# Patient Record
Sex: Female | Born: 1935
Health system: Southern US, Community
[De-identification: ages and names within clinical notes are randomized; demographics above are authoritative.]

## PROBLEM LIST (undated history)

## (undated) DIAGNOSIS — J40 Bronchitis, not specified as acute or chronic: Secondary | ICD-10-CM

## (undated) DIAGNOSIS — E119 Type 2 diabetes mellitus without complications: Secondary | ICD-10-CM

## (undated) DIAGNOSIS — J449 Chronic obstructive pulmonary disease, unspecified: Secondary | ICD-10-CM

## (undated) DIAGNOSIS — C801 Malignant (primary) neoplasm, unspecified: Secondary | ICD-10-CM

## (undated) DIAGNOSIS — J189 Pneumonia, unspecified organism: Secondary | ICD-10-CM

## (undated) DIAGNOSIS — I10 Essential (primary) hypertension: Secondary | ICD-10-CM

## (undated) DIAGNOSIS — I4891 Unspecified atrial fibrillation: Secondary | ICD-10-CM

## (undated) DIAGNOSIS — K859 Acute pancreatitis without necrosis or infection, unspecified: Secondary | ICD-10-CM

## (undated) DIAGNOSIS — E785 Hyperlipidemia, unspecified: Secondary | ICD-10-CM

## (undated) DIAGNOSIS — I639 Cerebral infarction, unspecified: Secondary | ICD-10-CM

## (undated) DIAGNOSIS — E538 Deficiency of other specified B group vitamins: Secondary | ICD-10-CM

## (undated) HISTORY — DX: Hyperlipidemia, unspecified: E78.5

## (undated) HISTORY — DX: Bronchitis, not specified as acute or chronic: J40

## (undated) HISTORY — DX: Unspecified atrial fibrillation: I48.91

## (undated) HISTORY — DX: Malignant (primary) neoplasm, unspecified: C80.1

## (undated) HISTORY — DX: Essential (primary) hypertension: I10

## (undated) HISTORY — DX: Cerebral infarction, unspecified: I63.9

## (undated) HISTORY — DX: Chronic obstructive pulmonary disease, unspecified: J44.9

## (undated) HISTORY — DX: Deficiency of other specified B group vitamins: E53.8

## (undated) HISTORY — PX: COLONOSCOPY: SHX174

## (undated) HISTORY — DX: Type 2 diabetes mellitus without complications: E11.9

## (undated) HISTORY — DX: Pneumonia, unspecified organism: J18.9

---

## 1973-11-15 HISTORY — PX: ABDOMINAL HYSTERECTOMY: SHX81

## 1987-11-16 HISTORY — PX: BLADDER SUSPENSION: SHX72

## 2000-05-20 ENCOUNTER — Encounter: Admission: RE | Admit: 2000-05-20 | Discharge: 2000-05-20 | Payer: Self-pay | Admitting: Internal Medicine

## 2000-05-20 ENCOUNTER — Encounter: Payer: Self-pay | Admitting: Internal Medicine

## 2001-03-16 ENCOUNTER — Encounter: Admission: RE | Admit: 2001-03-16 | Discharge: 2001-03-16 | Payer: Self-pay | Admitting: Internal Medicine

## 2001-03-16 ENCOUNTER — Encounter: Payer: Self-pay | Admitting: Internal Medicine

## 2001-05-29 ENCOUNTER — Encounter: Admission: RE | Admit: 2001-05-29 | Discharge: 2001-05-29 | Payer: Self-pay | Admitting: Internal Medicine

## 2001-05-29 ENCOUNTER — Encounter: Payer: Self-pay | Admitting: Internal Medicine

## 2002-05-09 ENCOUNTER — Encounter: Admission: RE | Admit: 2002-05-09 | Discharge: 2002-05-09 | Payer: Self-pay | Admitting: Unknown Physician Specialty

## 2002-05-09 ENCOUNTER — Encounter: Payer: Self-pay | Admitting: Unknown Physician Specialty

## 2004-09-03 ENCOUNTER — Ambulatory Visit: Payer: Self-pay | Admitting: Pain Medicine

## 2004-09-09 ENCOUNTER — Ambulatory Visit: Payer: Self-pay | Admitting: Pain Medicine

## 2004-11-26 ENCOUNTER — Ambulatory Visit: Payer: Self-pay | Admitting: Pain Medicine

## 2005-01-11 ENCOUNTER — Ambulatory Visit: Payer: Self-pay | Admitting: Pain Medicine

## 2005-01-21 ENCOUNTER — Emergency Department: Payer: Self-pay | Admitting: Emergency Medicine

## 2005-02-05 ENCOUNTER — Ambulatory Visit: Payer: Self-pay | Admitting: Family Medicine

## 2005-04-08 ENCOUNTER — Ambulatory Visit: Payer: Self-pay

## 2005-05-15 ENCOUNTER — Inpatient Hospital Stay: Payer: Self-pay

## 2005-08-27 ENCOUNTER — Ambulatory Visit: Payer: Self-pay | Admitting: Unknown Physician Specialty

## 2007-02-09 ENCOUNTER — Ambulatory Visit: Payer: Self-pay | Admitting: Family Medicine

## 2009-03-06 ENCOUNTER — Ambulatory Visit: Payer: Self-pay | Admitting: Family Medicine

## 2009-04-10 DIAGNOSIS — E7800 Pure hypercholesterolemia, unspecified: Secondary | ICD-10-CM

## 2009-04-10 DIAGNOSIS — I10 Essential (primary) hypertension: Secondary | ICD-10-CM | POA: Insufficient documentation

## 2009-04-10 DIAGNOSIS — K5732 Diverticulitis of large intestine without perforation or abscess without bleeding: Secondary | ICD-10-CM | POA: Insufficient documentation

## 2009-06-18 DIAGNOSIS — N3281 Overactive bladder: Secondary | ICD-10-CM

## 2009-06-19 ENCOUNTER — Ambulatory Visit: Payer: Self-pay | Admitting: Family Medicine

## 2009-06-26 ENCOUNTER — Ambulatory Visit: Payer: Self-pay | Admitting: Family Medicine

## 2009-07-29 ENCOUNTER — Ambulatory Visit: Payer: Self-pay | Admitting: Family Medicine

## 2009-08-31 DIAGNOSIS — N318 Other neuromuscular dysfunction of bladder: Secondary | ICD-10-CM

## 2010-02-04 ENCOUNTER — Ambulatory Visit: Payer: Self-pay | Admitting: Family Medicine

## 2010-02-04 DIAGNOSIS — M19049 Primary osteoarthritis, unspecified hand: Secondary | ICD-10-CM

## 2010-05-24 ENCOUNTER — Emergency Department: Payer: Self-pay | Admitting: Emergency Medicine

## 2011-03-01 ENCOUNTER — Ambulatory Visit: Payer: Self-pay | Admitting: Family Medicine

## 2011-03-22 ENCOUNTER — Ambulatory Visit: Payer: Self-pay

## 2011-12-17 DIAGNOSIS — I82409 Acute embolism and thrombosis of unspecified deep veins of unspecified lower extremity: Secondary | ICD-10-CM | POA: Diagnosis not present

## 2011-12-17 DIAGNOSIS — Z7901 Long term (current) use of anticoagulants: Secondary | ICD-10-CM | POA: Diagnosis not present

## 2011-12-17 DIAGNOSIS — E559 Vitamin D deficiency, unspecified: Secondary | ICD-10-CM | POA: Diagnosis not present

## 2011-12-17 DIAGNOSIS — Z86718 Personal history of other venous thrombosis and embolism: Secondary | ICD-10-CM | POA: Diagnosis not present

## 2012-01-05 DIAGNOSIS — Z86718 Personal history of other venous thrombosis and embolism: Secondary | ICD-10-CM | POA: Diagnosis not present

## 2012-01-05 DIAGNOSIS — Z7901 Long term (current) use of anticoagulants: Secondary | ICD-10-CM | POA: Diagnosis not present

## 2012-01-05 DIAGNOSIS — I82409 Acute embolism and thrombosis of unspecified deep veins of unspecified lower extremity: Secondary | ICD-10-CM | POA: Diagnosis not present

## 2012-01-05 DIAGNOSIS — E559 Vitamin D deficiency, unspecified: Secondary | ICD-10-CM | POA: Diagnosis not present

## 2012-01-18 DIAGNOSIS — G5600 Carpal tunnel syndrome, unspecified upper limb: Secondary | ICD-10-CM | POA: Diagnosis not present

## 2012-01-18 DIAGNOSIS — E1142 Type 2 diabetes mellitus with diabetic polyneuropathy: Secondary | ICD-10-CM | POA: Diagnosis not present

## 2012-01-18 DIAGNOSIS — G252 Other specified forms of tremor: Secondary | ICD-10-CM | POA: Diagnosis not present

## 2012-01-18 DIAGNOSIS — G250 Essential tremor: Secondary | ICD-10-CM | POA: Diagnosis not present

## 2012-02-02 DIAGNOSIS — E559 Vitamin D deficiency, unspecified: Secondary | ICD-10-CM | POA: Diagnosis not present

## 2012-02-02 DIAGNOSIS — Z86718 Personal history of other venous thrombosis and embolism: Secondary | ICD-10-CM | POA: Diagnosis not present

## 2012-02-02 DIAGNOSIS — Z7901 Long term (current) use of anticoagulants: Secondary | ICD-10-CM | POA: Diagnosis not present

## 2012-02-02 DIAGNOSIS — I82409 Acute embolism and thrombosis of unspecified deep veins of unspecified lower extremity: Secondary | ICD-10-CM | POA: Diagnosis not present

## 2012-03-01 DIAGNOSIS — E559 Vitamin D deficiency, unspecified: Secondary | ICD-10-CM | POA: Diagnosis not present

## 2012-03-01 DIAGNOSIS — Z7901 Long term (current) use of anticoagulants: Secondary | ICD-10-CM | POA: Diagnosis not present

## 2012-03-01 DIAGNOSIS — Z86718 Personal history of other venous thrombosis and embolism: Secondary | ICD-10-CM | POA: Diagnosis not present

## 2012-03-01 DIAGNOSIS — I82409 Acute embolism and thrombosis of unspecified deep veins of unspecified lower extremity: Secondary | ICD-10-CM | POA: Diagnosis not present

## 2012-03-24 DIAGNOSIS — Z7901 Long term (current) use of anticoagulants: Secondary | ICD-10-CM | POA: Diagnosis not present

## 2012-03-24 DIAGNOSIS — Z86718 Personal history of other venous thrombosis and embolism: Secondary | ICD-10-CM | POA: Diagnosis not present

## 2012-03-24 DIAGNOSIS — E119 Type 2 diabetes mellitus without complications: Secondary | ICD-10-CM | POA: Diagnosis not present

## 2012-03-24 DIAGNOSIS — E559 Vitamin D deficiency, unspecified: Secondary | ICD-10-CM | POA: Diagnosis not present

## 2012-03-24 DIAGNOSIS — I10 Essential (primary) hypertension: Secondary | ICD-10-CM | POA: Diagnosis not present

## 2012-04-26 DIAGNOSIS — M79609 Pain in unspecified limb: Secondary | ICD-10-CM | POA: Diagnosis not present

## 2012-04-26 DIAGNOSIS — R252 Cramp and spasm: Secondary | ICD-10-CM | POA: Diagnosis not present

## 2012-05-09 DIAGNOSIS — E559 Vitamin D deficiency, unspecified: Secondary | ICD-10-CM | POA: Diagnosis not present

## 2012-05-09 DIAGNOSIS — Z7901 Long term (current) use of anticoagulants: Secondary | ICD-10-CM | POA: Diagnosis not present

## 2012-05-09 DIAGNOSIS — I10 Essential (primary) hypertension: Secondary | ICD-10-CM | POA: Diagnosis not present

## 2012-05-09 DIAGNOSIS — E538 Deficiency of other specified B group vitamins: Secondary | ICD-10-CM | POA: Diagnosis not present

## 2012-05-09 DIAGNOSIS — R252 Cramp and spasm: Secondary | ICD-10-CM | POA: Diagnosis not present

## 2012-06-22 DIAGNOSIS — E119 Type 2 diabetes mellitus without complications: Secondary | ICD-10-CM | POA: Diagnosis not present

## 2012-06-22 DIAGNOSIS — Z7901 Long term (current) use of anticoagulants: Secondary | ICD-10-CM | POA: Diagnosis not present

## 2012-06-22 DIAGNOSIS — I10 Essential (primary) hypertension: Secondary | ICD-10-CM | POA: Diagnosis not present

## 2012-07-25 DIAGNOSIS — E119 Type 2 diabetes mellitus without complications: Secondary | ICD-10-CM | POA: Diagnosis not present

## 2012-07-25 DIAGNOSIS — I10 Essential (primary) hypertension: Secondary | ICD-10-CM | POA: Diagnosis not present

## 2012-07-25 DIAGNOSIS — Z7901 Long term (current) use of anticoagulants: Secondary | ICD-10-CM | POA: Diagnosis not present

## 2012-08-04 DIAGNOSIS — Z23 Encounter for immunization: Secondary | ICD-10-CM | POA: Diagnosis not present

## 2012-08-22 DIAGNOSIS — Z86718 Personal history of other venous thrombosis and embolism: Secondary | ICD-10-CM | POA: Diagnosis not present

## 2012-08-22 DIAGNOSIS — Z7901 Long term (current) use of anticoagulants: Secondary | ICD-10-CM | POA: Diagnosis not present

## 2012-08-22 DIAGNOSIS — I10 Essential (primary) hypertension: Secondary | ICD-10-CM | POA: Diagnosis not present

## 2012-09-19 DIAGNOSIS — I10 Essential (primary) hypertension: Secondary | ICD-10-CM | POA: Diagnosis not present

## 2012-09-19 DIAGNOSIS — Z7901 Long term (current) use of anticoagulants: Secondary | ICD-10-CM | POA: Diagnosis not present

## 2012-09-19 DIAGNOSIS — I82409 Acute embolism and thrombosis of unspecified deep veins of unspecified lower extremity: Secondary | ICD-10-CM | POA: Diagnosis not present

## 2012-09-26 DIAGNOSIS — G5600 Carpal tunnel syndrome, unspecified upper limb: Secondary | ICD-10-CM | POA: Diagnosis not present

## 2012-09-26 DIAGNOSIS — M79609 Pain in unspecified limb: Secondary | ICD-10-CM | POA: Diagnosis not present

## 2012-09-26 DIAGNOSIS — E1142 Type 2 diabetes mellitus with diabetic polyneuropathy: Secondary | ICD-10-CM | POA: Diagnosis not present

## 2012-10-24 DIAGNOSIS — I82409 Acute embolism and thrombosis of unspecified deep veins of unspecified lower extremity: Secondary | ICD-10-CM | POA: Diagnosis not present

## 2012-11-23 DIAGNOSIS — I82409 Acute embolism and thrombosis of unspecified deep veins of unspecified lower extremity: Secondary | ICD-10-CM | POA: Diagnosis not present

## 2012-11-28 DIAGNOSIS — Z7901 Long term (current) use of anticoagulants: Secondary | ICD-10-CM | POA: Diagnosis not present

## 2012-11-28 DIAGNOSIS — Z86718 Personal history of other venous thrombosis and embolism: Secondary | ICD-10-CM | POA: Diagnosis not present

## 2012-11-28 DIAGNOSIS — E119 Type 2 diabetes mellitus without complications: Secondary | ICD-10-CM | POA: Diagnosis not present

## 2012-11-28 DIAGNOSIS — R609 Edema, unspecified: Secondary | ICD-10-CM | POA: Diagnosis not present

## 2012-12-22 DIAGNOSIS — I82409 Acute embolism and thrombosis of unspecified deep veins of unspecified lower extremity: Secondary | ICD-10-CM | POA: Diagnosis not present

## 2013-01-20 DIAGNOSIS — I82409 Acute embolism and thrombosis of unspecified deep veins of unspecified lower extremity: Secondary | ICD-10-CM | POA: Diagnosis not present

## 2013-01-26 DIAGNOSIS — M6580 Other synovitis and tenosynovitis, unspecified site: Secondary | ICD-10-CM | POA: Diagnosis not present

## 2013-02-16 DIAGNOSIS — I82409 Acute embolism and thrombosis of unspecified deep veins of unspecified lower extremity: Secondary | ICD-10-CM | POA: Diagnosis not present

## 2013-02-27 DIAGNOSIS — E119 Type 2 diabetes mellitus without complications: Secondary | ICD-10-CM | POA: Diagnosis not present

## 2013-02-27 DIAGNOSIS — E559 Vitamin D deficiency, unspecified: Secondary | ICD-10-CM | POA: Diagnosis not present

## 2013-02-27 DIAGNOSIS — I10 Essential (primary) hypertension: Secondary | ICD-10-CM | POA: Diagnosis not present

## 2013-02-27 DIAGNOSIS — Z7901 Long term (current) use of anticoagulants: Secondary | ICD-10-CM | POA: Diagnosis not present

## 2013-03-16 DIAGNOSIS — I82409 Acute embolism and thrombosis of unspecified deep veins of unspecified lower extremity: Secondary | ICD-10-CM | POA: Diagnosis not present

## 2013-04-14 DIAGNOSIS — I82409 Acute embolism and thrombosis of unspecified deep veins of unspecified lower extremity: Secondary | ICD-10-CM | POA: Diagnosis not present

## 2013-05-12 DIAGNOSIS — I82409 Acute embolism and thrombosis of unspecified deep veins of unspecified lower extremity: Secondary | ICD-10-CM | POA: Diagnosis not present

## 2013-05-29 DIAGNOSIS — E119 Type 2 diabetes mellitus without complications: Secondary | ICD-10-CM | POA: Diagnosis not present

## 2013-06-08 DIAGNOSIS — I82409 Acute embolism and thrombosis of unspecified deep veins of unspecified lower extremity: Secondary | ICD-10-CM | POA: Diagnosis not present

## 2013-07-06 DIAGNOSIS — I82409 Acute embolism and thrombosis of unspecified deep veins of unspecified lower extremity: Secondary | ICD-10-CM | POA: Diagnosis not present

## 2013-07-10 DIAGNOSIS — I10 Essential (primary) hypertension: Secondary | ICD-10-CM | POA: Diagnosis not present

## 2013-07-10 DIAGNOSIS — Z7901 Long term (current) use of anticoagulants: Secondary | ICD-10-CM | POA: Diagnosis not present

## 2013-07-10 DIAGNOSIS — Z23 Encounter for immunization: Secondary | ICD-10-CM | POA: Diagnosis not present

## 2013-07-10 DIAGNOSIS — I82409 Acute embolism and thrombosis of unspecified deep veins of unspecified lower extremity: Secondary | ICD-10-CM | POA: Diagnosis not present

## 2013-07-10 DIAGNOSIS — E1149 Type 2 diabetes mellitus with other diabetic neurological complication: Secondary | ICD-10-CM | POA: Diagnosis not present

## 2013-08-10 DIAGNOSIS — I82409 Acute embolism and thrombosis of unspecified deep veins of unspecified lower extremity: Secondary | ICD-10-CM | POA: Diagnosis not present

## 2013-09-14 DIAGNOSIS — I82409 Acute embolism and thrombosis of unspecified deep veins of unspecified lower extremity: Secondary | ICD-10-CM | POA: Diagnosis not present

## 2013-10-02 DIAGNOSIS — J019 Acute sinusitis, unspecified: Secondary | ICD-10-CM | POA: Diagnosis not present

## 2013-10-02 DIAGNOSIS — Z1212 Encounter for screening for malignant neoplasm of rectum: Secondary | ICD-10-CM | POA: Diagnosis not present

## 2013-10-02 DIAGNOSIS — Z7901 Long term (current) use of anticoagulants: Secondary | ICD-10-CM | POA: Diagnosis not present

## 2013-10-02 DIAGNOSIS — I10 Essential (primary) hypertension: Secondary | ICD-10-CM | POA: Diagnosis not present

## 2013-10-02 DIAGNOSIS — R198 Other specified symptoms and signs involving the digestive system and abdomen: Secondary | ICD-10-CM | POA: Diagnosis not present

## 2013-10-02 DIAGNOSIS — R109 Unspecified abdominal pain: Secondary | ICD-10-CM | POA: Diagnosis not present

## 2013-10-02 DIAGNOSIS — E119 Type 2 diabetes mellitus without complications: Secondary | ICD-10-CM | POA: Diagnosis not present

## 2013-11-22 DIAGNOSIS — I82409 Acute embolism and thrombosis of unspecified deep veins of unspecified lower extremity: Secondary | ICD-10-CM | POA: Diagnosis not present

## 2013-12-03 DIAGNOSIS — I10 Essential (primary) hypertension: Secondary | ICD-10-CM | POA: Diagnosis not present

## 2013-12-03 DIAGNOSIS — J00 Acute nasopharyngitis [common cold]: Secondary | ICD-10-CM | POA: Diagnosis not present

## 2013-12-03 DIAGNOSIS — R05 Cough: Secondary | ICD-10-CM | POA: Diagnosis not present

## 2013-12-03 DIAGNOSIS — R059 Cough, unspecified: Secondary | ICD-10-CM | POA: Diagnosis not present

## 2013-12-03 DIAGNOSIS — E119 Type 2 diabetes mellitus without complications: Secondary | ICD-10-CM | POA: Diagnosis not present

## 2013-12-05 DIAGNOSIS — R918 Other nonspecific abnormal finding of lung field: Secondary | ICD-10-CM | POA: Diagnosis not present

## 2013-12-05 DIAGNOSIS — J189 Pneumonia, unspecified organism: Secondary | ICD-10-CM | POA: Diagnosis not present

## 2013-12-12 ENCOUNTER — Emergency Department: Payer: Self-pay | Admitting: Emergency Medicine

## 2013-12-12 DIAGNOSIS — Z9079 Acquired absence of other genital organ(s): Secondary | ICD-10-CM | POA: Diagnosis not present

## 2013-12-12 DIAGNOSIS — J984 Other disorders of lung: Secondary | ICD-10-CM | POA: Diagnosis not present

## 2013-12-12 DIAGNOSIS — R112 Nausea with vomiting, unspecified: Secondary | ICD-10-CM | POA: Diagnosis not present

## 2013-12-12 DIAGNOSIS — I10 Essential (primary) hypertension: Secondary | ICD-10-CM | POA: Diagnosis not present

## 2013-12-12 DIAGNOSIS — E119 Type 2 diabetes mellitus without complications: Secondary | ICD-10-CM | POA: Diagnosis not present

## 2013-12-12 DIAGNOSIS — Z86718 Personal history of other venous thrombosis and embolism: Secondary | ICD-10-CM | POA: Diagnosis not present

## 2013-12-12 DIAGNOSIS — Z7901 Long term (current) use of anticoagulants: Secondary | ICD-10-CM | POA: Diagnosis not present

## 2013-12-12 DIAGNOSIS — R109 Unspecified abdominal pain: Secondary | ICD-10-CM | POA: Diagnosis not present

## 2013-12-12 DIAGNOSIS — K5900 Constipation, unspecified: Secondary | ICD-10-CM | POA: Diagnosis not present

## 2013-12-12 LAB — URINALYSIS, COMPLETE
BILIRUBIN, UR: NEGATIVE
BLOOD: NEGATIVE
Bacteria: NONE SEEN
Glucose,UR: NEGATIVE mg/dL (ref 0–75)
KETONE: NEGATIVE
Leukocyte Esterase: NEGATIVE
Nitrite: NEGATIVE
PH: 5 (ref 4.5–8.0)
PROTEIN: NEGATIVE
SPECIFIC GRAVITY: 1.012 (ref 1.003–1.030)

## 2013-12-12 LAB — COMPREHENSIVE METABOLIC PANEL
Albumin: 3.3 g/dL — ABNORMAL LOW (ref 3.4–5.0)
Alkaline Phosphatase: 71 U/L
Anion Gap: 7 (ref 7–16)
BUN: 33 mg/dL — ABNORMAL HIGH (ref 7–18)
Bilirubin,Total: 0.4 mg/dL (ref 0.2–1.0)
Calcium, Total: 9.4 mg/dL (ref 8.5–10.1)
Chloride: 99 mmol/L (ref 98–107)
Co2: 24 mmol/L (ref 21–32)
Creatinine: 1.68 mg/dL — ABNORMAL HIGH (ref 0.60–1.30)
EGFR (African American): 32 — ABNORMAL LOW
EGFR (Non-African Amer.): 27 — ABNORMAL LOW
Glucose: 155 mg/dL — ABNORMAL HIGH (ref 65–99)
Osmolality: 271 (ref 275–301)
Potassium: 4.5 mmol/L (ref 3.5–5.1)
SGOT(AST): 23 U/L (ref 15–37)
SGPT (ALT): 22 U/L (ref 12–78)
Sodium: 130 mmol/L — ABNORMAL LOW (ref 136–145)
Total Protein: 7.6 g/dL (ref 6.4–8.2)

## 2013-12-12 LAB — CBC
HCT: 36.8 % (ref 35.0–47.0)
HGB: 12.4 g/dL (ref 12.0–16.0)
MCH: 28.5 pg (ref 26.0–34.0)
MCHC: 33.7 g/dL (ref 32.0–36.0)
MCV: 85 fL (ref 80–100)
Platelet: 416 10*3/uL (ref 150–440)
RBC: 4.35 10*6/uL (ref 3.80–5.20)
RDW: 15.1 % — ABNORMAL HIGH (ref 11.5–14.5)
WBC: 11.1 10*3/uL — ABNORMAL HIGH (ref 3.6–11.0)

## 2013-12-12 LAB — PROTIME-INR
INR: 2.6
Prothrombin Time: 26.9 secs — ABNORMAL HIGH (ref 11.5–14.7)

## 2013-12-12 LAB — LIPASE, BLOOD: Lipase: 145 U/L (ref 73–393)

## 2013-12-12 LAB — TROPONIN I: Troponin-I: <0.02 ng/mL

## 2013-12-24 DIAGNOSIS — I82409 Acute embolism and thrombosis of unspecified deep veins of unspecified lower extremity: Secondary | ICD-10-CM | POA: Diagnosis not present

## 2014-01-15 DIAGNOSIS — I10 Essential (primary) hypertension: Secondary | ICD-10-CM | POA: Diagnosis not present

## 2014-01-15 DIAGNOSIS — G459 Transient cerebral ischemic attack, unspecified: Secondary | ICD-10-CM | POA: Diagnosis not present

## 2014-01-15 DIAGNOSIS — E119 Type 2 diabetes mellitus without complications: Secondary | ICD-10-CM | POA: Diagnosis not present

## 2014-01-15 DIAGNOSIS — Z1212 Encounter for screening for malignant neoplasm of rectum: Secondary | ICD-10-CM | POA: Diagnosis not present

## 2014-01-15 DIAGNOSIS — Z86718 Personal history of other venous thrombosis and embolism: Secondary | ICD-10-CM | POA: Diagnosis not present

## 2014-01-21 DIAGNOSIS — I82409 Acute embolism and thrombosis of unspecified deep veins of unspecified lower extremity: Secondary | ICD-10-CM | POA: Diagnosis not present

## 2014-01-22 ENCOUNTER — Ambulatory Visit: Payer: Self-pay | Admitting: Family Medicine

## 2014-01-22 DIAGNOSIS — I658 Occlusion and stenosis of other precerebral arteries: Secondary | ICD-10-CM | POA: Diagnosis not present

## 2014-01-22 DIAGNOSIS — I6529 Occlusion and stenosis of unspecified carotid artery: Secondary | ICD-10-CM | POA: Diagnosis not present

## 2014-02-18 DIAGNOSIS — I82409 Acute embolism and thrombosis of unspecified deep veins of unspecified lower extremity: Secondary | ICD-10-CM | POA: Diagnosis not present

## 2014-03-04 ENCOUNTER — Inpatient Hospital Stay: Payer: Self-pay | Admitting: Family Medicine

## 2014-03-04 DIAGNOSIS — I10 Essential (primary) hypertension: Secondary | ICD-10-CM | POA: Diagnosis not present

## 2014-03-04 DIAGNOSIS — E871 Hypo-osmolality and hyponatremia: Secondary | ICD-10-CM | POA: Diagnosis not present

## 2014-03-04 DIAGNOSIS — R5381 Other malaise: Secondary | ICD-10-CM | POA: Diagnosis present

## 2014-03-04 DIAGNOSIS — I809 Phlebitis and thrombophlebitis of unspecified site: Secondary | ICD-10-CM | POA: Diagnosis not present

## 2014-03-04 DIAGNOSIS — N10 Acute tubulo-interstitial nephritis: Secondary | ICD-10-CM | POA: Diagnosis not present

## 2014-03-04 DIAGNOSIS — E119 Type 2 diabetes mellitus without complications: Secondary | ICD-10-CM | POA: Diagnosis not present

## 2014-03-04 DIAGNOSIS — Z66 Do not resuscitate: Secondary | ICD-10-CM | POA: Diagnosis present

## 2014-03-04 DIAGNOSIS — R5383 Other fatigue: Secondary | ICD-10-CM | POA: Diagnosis present

## 2014-03-04 DIAGNOSIS — J984 Other disorders of lung: Secondary | ICD-10-CM | POA: Diagnosis not present

## 2014-03-04 DIAGNOSIS — Z87891 Personal history of nicotine dependence: Secondary | ICD-10-CM | POA: Diagnosis not present

## 2014-03-04 DIAGNOSIS — A419 Sepsis, unspecified organism: Secondary | ICD-10-CM | POA: Diagnosis not present

## 2014-03-04 DIAGNOSIS — N390 Urinary tract infection, site not specified: Secondary | ICD-10-CM | POA: Diagnosis not present

## 2014-03-04 DIAGNOSIS — Z86718 Personal history of other venous thrombosis and embolism: Secondary | ICD-10-CM | POA: Diagnosis not present

## 2014-03-04 DIAGNOSIS — R42 Dizziness and giddiness: Secondary | ICD-10-CM | POA: Diagnosis not present

## 2014-03-04 DIAGNOSIS — Z79899 Other long term (current) drug therapy: Secondary | ICD-10-CM | POA: Diagnosis not present

## 2014-03-04 DIAGNOSIS — Z7982 Long term (current) use of aspirin: Secondary | ICD-10-CM | POA: Diagnosis not present

## 2014-03-04 LAB — CBC WITH DIFFERENTIAL/PLATELET
Basophil #: 0.1 10*3/uL (ref 0.0–0.1)
Basophil %: 0.5 %
EOS ABS: 0 10*3/uL (ref 0.0–0.7)
Eosinophil %: 0.1 %
HCT: 40.6 % (ref 35.0–47.0)
HGB: 13.2 g/dL (ref 12.0–16.0)
LYMPHS PCT: 5.1 %
Lymphocyte #: 0.8 10*3/uL — ABNORMAL LOW (ref 1.0–3.6)
MCH: 27 pg (ref 26.0–34.0)
MCHC: 32.6 g/dL (ref 32.0–36.0)
MCV: 83 fL (ref 80–100)
Monocyte #: 0.6 x10 3/mm (ref 0.2–0.9)
Monocyte %: 4.2 %
NEUTROS ABS: 13.5 10*3/uL — AB (ref 1.4–6.5)
NEUTROS PCT: 90.1 %
Platelet: 296 10*3/uL (ref 150–440)
RBC: 4.91 10*6/uL (ref 3.80–5.20)
RDW: 14.5 % (ref 11.5–14.5)
WBC: 15 10*3/uL — ABNORMAL HIGH (ref 3.6–11.0)

## 2014-03-04 LAB — URINALYSIS, COMPLETE
BACTERIA: NONE SEEN
Bilirubin,UR: NEGATIVE
Blood: NEGATIVE
Glucose,UR: 50 mg/dL (ref 0–75)
NITRITE: NEGATIVE
Ph: 5 (ref 4.5–8.0)
Protein: 30
SPECIFIC GRAVITY: 1.012 (ref 1.003–1.030)
Squamous Epithelial: 3
WBC UR: 61 /HPF (ref 0–5)

## 2014-03-04 LAB — BASIC METABOLIC PANEL
ANION GAP: 7 (ref 7–16)
BUN: 9 mg/dL (ref 7–18)
CALCIUM: 9.5 mg/dL (ref 8.5–10.1)
Chloride: 95 mmol/L — ABNORMAL LOW (ref 98–107)
Co2: 25 mmol/L (ref 21–32)
Creatinine: 0.88 mg/dL (ref 0.60–1.30)
GFR CALC NON AF AMER: 60 — AB
Glucose: 161 mg/dL — ABNORMAL HIGH (ref 65–99)
OSMOLALITY: 257 (ref 275–301)
Potassium: 4 mmol/L (ref 3.5–5.1)
Sodium: 127 mmol/L — ABNORMAL LOW (ref 136–145)

## 2014-03-04 LAB — PROTIME-INR
INR: 2
Prothrombin Time: 22.5 secs — ABNORMAL HIGH (ref 11.5–14.7)

## 2014-03-05 DIAGNOSIS — I10 Essential (primary) hypertension: Secondary | ICD-10-CM | POA: Diagnosis not present

## 2014-03-05 DIAGNOSIS — N390 Urinary tract infection, site not specified: Secondary | ICD-10-CM | POA: Diagnosis not present

## 2014-03-05 DIAGNOSIS — E119 Type 2 diabetes mellitus without complications: Secondary | ICD-10-CM | POA: Diagnosis not present

## 2014-03-05 DIAGNOSIS — E871 Hypo-osmolality and hyponatremia: Secondary | ICD-10-CM | POA: Diagnosis not present

## 2014-03-05 DIAGNOSIS — I809 Phlebitis and thrombophlebitis of unspecified site: Secondary | ICD-10-CM | POA: Diagnosis not present

## 2014-03-05 LAB — BASIC METABOLIC PANEL
ANION GAP: 7 (ref 7–16)
BUN: 13 mg/dL (ref 7–18)
CALCIUM: 8.5 mg/dL (ref 8.5–10.1)
CHLORIDE: 94 mmol/L — AB (ref 98–107)
CREATININE: 0.92 mg/dL (ref 0.60–1.30)
Co2: 26 mmol/L (ref 21–32)
EGFR (African American): >60
EGFR (Non-African Amer.): 56 — ABNORMAL LOW
Glucose: 94 mg/dL (ref 65–99)
Osmolality: 255 (ref 275–301)
Potassium: 3.7 mmol/L (ref 3.5–5.1)
SODIUM: 127 mmol/L — AB (ref 136–145)

## 2014-03-05 LAB — CBC WITH DIFFERENTIAL/PLATELET
Basophil #: 0 10*3/uL (ref 0.0–0.1)
Basophil %: 0.4 %
EOS ABS: 0 10*3/uL (ref 0.0–0.7)
Eosinophil %: 0.1 %
HCT: 32.6 % — AB (ref 35.0–47.0)
HGB: 10.8 g/dL — ABNORMAL LOW (ref 12.0–16.0)
LYMPHS PCT: 12 %
Lymphocyte #: 1.1 10*3/uL (ref 1.0–3.6)
MCH: 26.9 pg (ref 26.0–34.0)
MCHC: 33.2 g/dL (ref 32.0–36.0)
MCV: 81 fL (ref 80–100)
MONO ABS: 0.6 x10 3/mm (ref 0.2–0.9)
Monocyte %: 6 %
NEUTROS ABS: 7.5 10*3/uL — AB (ref 1.4–6.5)
Neutrophil %: 81.5 %
PLATELETS: 229 10*3/uL (ref 150–440)
RBC: 4.02 10*6/uL (ref 3.80–5.20)
RDW: 14.3 % (ref 11.5–14.5)
WBC: 9.2 10*3/uL (ref 3.6–11.0)

## 2014-03-05 LAB — PROTIME-INR
INR: 2
Prothrombin Time: 22.5 secs — ABNORMAL HIGH (ref 11.5–14.7)

## 2014-03-06 DIAGNOSIS — I809 Phlebitis and thrombophlebitis of unspecified site: Secondary | ICD-10-CM | POA: Diagnosis not present

## 2014-03-06 DIAGNOSIS — I10 Essential (primary) hypertension: Secondary | ICD-10-CM | POA: Diagnosis not present

## 2014-03-06 DIAGNOSIS — N390 Urinary tract infection, site not specified: Secondary | ICD-10-CM | POA: Diagnosis not present

## 2014-03-06 DIAGNOSIS — E119 Type 2 diabetes mellitus without complications: Secondary | ICD-10-CM | POA: Diagnosis not present

## 2014-03-06 DIAGNOSIS — E871 Hypo-osmolality and hyponatremia: Secondary | ICD-10-CM | POA: Diagnosis not present

## 2014-03-06 LAB — BASIC METABOLIC PANEL
Anion Gap: 7 (ref 7–16)
BUN: 14 mg/dL (ref 7–18)
CALCIUM: 8.4 mg/dL — AB (ref 8.5–10.1)
CHLORIDE: 99 mmol/L (ref 98–107)
CREATININE: 0.85 mg/dL (ref 0.60–1.30)
Co2: 25 mmol/L (ref 21–32)
EGFR (Non-African Amer.): >60
Glucose: 100 mg/dL — ABNORMAL HIGH (ref 65–99)
OSMOLALITY: 263 (ref 275–301)
Potassium: 3.7 mmol/L (ref 3.5–5.1)
Sodium: 131 mmol/L — ABNORMAL LOW (ref 136–145)

## 2014-03-06 LAB — URINE CULTURE

## 2014-03-06 LAB — PROTIME-INR
INR: 2.1
Prothrombin Time: 23 secs — ABNORMAL HIGH (ref 11.5–14.7)

## 2014-03-09 LAB — CULTURE, BLOOD (SINGLE)

## 2014-03-13 DIAGNOSIS — Z1212 Encounter for screening for malignant neoplasm of rectum: Secondary | ICD-10-CM | POA: Diagnosis not present

## 2014-03-13 DIAGNOSIS — I10 Essential (primary) hypertension: Secondary | ICD-10-CM | POA: Diagnosis not present

## 2014-03-13 DIAGNOSIS — G459 Transient cerebral ischemic attack, unspecified: Secondary | ICD-10-CM | POA: Diagnosis not present

## 2014-03-13 DIAGNOSIS — Z86718 Personal history of other venous thrombosis and embolism: Secondary | ICD-10-CM | POA: Diagnosis not present

## 2014-03-13 DIAGNOSIS — N390 Urinary tract infection, site not specified: Secondary | ICD-10-CM | POA: Diagnosis not present

## 2014-03-18 DIAGNOSIS — I82409 Acute embolism and thrombosis of unspecified deep veins of unspecified lower extremity: Secondary | ICD-10-CM | POA: Diagnosis not present

## 2014-03-25 ENCOUNTER — Ambulatory Visit: Payer: Self-pay | Admitting: Family Medicine

## 2014-03-25 DIAGNOSIS — Z86718 Personal history of other venous thrombosis and embolism: Secondary | ICD-10-CM | POA: Diagnosis not present

## 2014-03-25 DIAGNOSIS — R05 Cough: Secondary | ICD-10-CM | POA: Diagnosis not present

## 2014-03-25 DIAGNOSIS — I10 Essential (primary) hypertension: Secondary | ICD-10-CM | POA: Diagnosis not present

## 2014-03-25 DIAGNOSIS — Z1212 Encounter for screening for malignant neoplasm of rectum: Secondary | ICD-10-CM | POA: Diagnosis not present

## 2014-03-25 DIAGNOSIS — G459 Transient cerebral ischemic attack, unspecified: Secondary | ICD-10-CM | POA: Diagnosis not present

## 2014-03-25 DIAGNOSIS — R059 Cough, unspecified: Secondary | ICD-10-CM | POA: Diagnosis not present

## 2014-03-27 DIAGNOSIS — J8410 Pulmonary fibrosis, unspecified: Secondary | ICD-10-CM | POA: Diagnosis not present

## 2014-03-27 DIAGNOSIS — I959 Hypotension, unspecified: Secondary | ICD-10-CM | POA: Diagnosis not present

## 2014-03-27 DIAGNOSIS — R0602 Shortness of breath: Secondary | ICD-10-CM | POA: Diagnosis not present

## 2014-03-27 DIAGNOSIS — I499 Cardiac arrhythmia, unspecified: Secondary | ICD-10-CM | POA: Diagnosis not present

## 2014-03-27 DIAGNOSIS — J441 Chronic obstructive pulmonary disease with (acute) exacerbation: Secondary | ICD-10-CM | POA: Diagnosis not present

## 2014-03-27 DIAGNOSIS — J984 Other disorders of lung: Secondary | ICD-10-CM | POA: Diagnosis not present

## 2014-03-27 LAB — BASIC METABOLIC PANEL
Anion Gap: 8 (ref 7–16)
BUN: 13 mg/dL (ref 7–18)
CALCIUM: 9.5 mg/dL (ref 8.5–10.1)
CHLORIDE: 103 mmol/L (ref 98–107)
CO2: 26 mmol/L (ref 21–32)
Creatinine: 0.82 mg/dL (ref 0.60–1.30)
EGFR (Non-African Amer.): >60
GLUCOSE: 182 mg/dL — AB (ref 65–99)
Osmolality: 279 (ref 275–301)
POTASSIUM: 4.2 mmol/L (ref 3.5–5.1)
Sodium: 137 mmol/L (ref 136–145)

## 2014-03-27 LAB — CBC
HCT: 42.7 % (ref 35.0–47.0)
HGB: 13.8 g/dL (ref 12.0–16.0)
MCH: 26.6 pg (ref 26.0–34.0)
MCHC: 32.3 g/dL (ref 32.0–36.0)
MCV: 82 fL (ref 80–100)
PLATELETS: 286 10*3/uL (ref 150–440)
RBC: 5.18 10*6/uL (ref 3.80–5.20)
RDW: 14.8 % — AB (ref 11.5–14.5)
WBC: 12.6 10*3/uL — ABNORMAL HIGH (ref 3.6–11.0)

## 2014-03-27 LAB — PROTIME-INR
INR: 2.1
Prothrombin Time: 23.3 secs — ABNORMAL HIGH (ref 11.5–14.7)

## 2014-03-27 LAB — TROPONIN I: Troponin-I: <0.02 ng/mL

## 2014-03-28 ENCOUNTER — Inpatient Hospital Stay: Payer: Self-pay | Admitting: Internal Medicine

## 2014-03-28 DIAGNOSIS — G2581 Restless legs syndrome: Secondary | ICD-10-CM | POA: Diagnosis present

## 2014-03-28 DIAGNOSIS — I499 Cardiac arrhythmia, unspecified: Secondary | ICD-10-CM | POA: Diagnosis not present

## 2014-03-28 DIAGNOSIS — M1990 Unspecified osteoarthritis, unspecified site: Secondary | ICD-10-CM | POA: Diagnosis present

## 2014-03-28 DIAGNOSIS — Z86718 Personal history of other venous thrombosis and embolism: Secondary | ICD-10-CM | POA: Diagnosis not present

## 2014-03-28 DIAGNOSIS — G609 Hereditary and idiopathic neuropathy, unspecified: Secondary | ICD-10-CM | POA: Diagnosis present

## 2014-03-28 DIAGNOSIS — Z86711 Personal history of pulmonary embolism: Secondary | ICD-10-CM | POA: Diagnosis not present

## 2014-03-28 DIAGNOSIS — I5022 Chronic systolic (congestive) heart failure: Secondary | ICD-10-CM | POA: Diagnosis not present

## 2014-03-28 DIAGNOSIS — Z7901 Long term (current) use of anticoagulants: Secondary | ICD-10-CM | POA: Diagnosis not present

## 2014-03-28 DIAGNOSIS — I959 Hypotension, unspecified: Secondary | ICD-10-CM | POA: Diagnosis not present

## 2014-03-28 DIAGNOSIS — J441 Chronic obstructive pulmonary disease with (acute) exacerbation: Secondary | ICD-10-CM

## 2014-03-28 DIAGNOSIS — Z87891 Personal history of nicotine dependence: Secondary | ICD-10-CM | POA: Diagnosis not present

## 2014-03-28 DIAGNOSIS — Z7982 Long term (current) use of aspirin: Secondary | ICD-10-CM | POA: Diagnosis not present

## 2014-03-28 DIAGNOSIS — R0602 Shortness of breath: Secondary | ICD-10-CM | POA: Diagnosis not present

## 2014-03-28 DIAGNOSIS — J8410 Pulmonary fibrosis, unspecified: Secondary | ICD-10-CM | POA: Diagnosis not present

## 2014-03-28 DIAGNOSIS — E119 Type 2 diabetes mellitus without complications: Secondary | ICD-10-CM | POA: Diagnosis not present

## 2014-03-28 DIAGNOSIS — E871 Hypo-osmolality and hyponatremia: Secondary | ICD-10-CM | POA: Diagnosis not present

## 2014-03-28 DIAGNOSIS — J309 Allergic rhinitis, unspecified: Secondary | ICD-10-CM | POA: Diagnosis present

## 2014-03-28 DIAGNOSIS — I517 Cardiomegaly: Secondary | ICD-10-CM | POA: Diagnosis not present

## 2014-03-28 DIAGNOSIS — I10 Essential (primary) hypertension: Secondary | ICD-10-CM | POA: Diagnosis not present

## 2014-03-28 DIAGNOSIS — Z8673 Personal history of transient ischemic attack (TIA), and cerebral infarction without residual deficits: Secondary | ICD-10-CM | POA: Diagnosis not present

## 2014-03-28 DIAGNOSIS — E785 Hyperlipidemia, unspecified: Secondary | ICD-10-CM | POA: Diagnosis not present

## 2014-03-28 DIAGNOSIS — J984 Other disorders of lung: Secondary | ICD-10-CM | POA: Diagnosis not present

## 2014-03-28 DIAGNOSIS — I509 Heart failure, unspecified: Secondary | ICD-10-CM | POA: Diagnosis not present

## 2014-03-28 DIAGNOSIS — J45901 Unspecified asthma with (acute) exacerbation: Secondary | ICD-10-CM | POA: Diagnosis not present

## 2014-03-28 LAB — TROPONIN I: Troponin-I: <0.02 ng/mL

## 2014-03-28 LAB — PRO B NATRIURETIC PEPTIDE: B-TYPE NATIURETIC PEPTID: 311 pg/mL (ref 0–450)

## 2014-03-28 LAB — CK-MB
CK-MB: 2.8 ng/mL (ref 0.5–3.6)
CK-MB: 2.4 ng/mL (ref 0.5–3.6)
CK-MB: 2.4 ng/mL (ref 0.5–3.6)

## 2014-03-29 LAB — CBC WITH DIFFERENTIAL/PLATELET
Basophil #: 0 10*3/uL (ref 0.0–0.1)
Basophil %: 0.2 %
EOS PCT: 0.1 %
Eosinophil #: 0 10*3/uL (ref 0.0–0.7)
HCT: 38.3 % (ref 35.0–47.0)
HGB: 12.5 g/dL (ref 12.0–16.0)
LYMPHS ABS: 1.4 10*3/uL (ref 1.0–3.6)
LYMPHS PCT: 10.2 %
MCH: 26.8 pg (ref 26.0–34.0)
MCHC: 32.8 g/dL (ref 32.0–36.0)
MCV: 82 fL (ref 80–100)
Monocyte #: 0.7 x10 3/mm (ref 0.2–0.9)
Monocyte %: 4.8 %
NEUTROS ABS: 11.7 10*3/uL — AB (ref 1.4–6.5)
Neutrophil %: 84.7 %
Platelet: 258 10*3/uL (ref 150–440)
RBC: 4.68 10*6/uL (ref 3.80–5.20)
RDW: 15.1 % — ABNORMAL HIGH (ref 11.5–14.5)
WBC: 13.8 10*3/uL — AB (ref 3.6–11.0)

## 2014-03-29 LAB — BASIC METABOLIC PANEL
Anion Gap: 8 (ref 7–16)
BUN: 28 mg/dL — ABNORMAL HIGH (ref 7–18)
CHLORIDE: 101 mmol/L (ref 98–107)
CO2: 27 mmol/L (ref 21–32)
CREATININE: 1.29 mg/dL (ref 0.60–1.30)
Calcium, Total: 9.4 mg/dL (ref 8.5–10.1)
EGFR (African American): 43 — ABNORMAL LOW
EGFR (Non-African Amer.): 37 — ABNORMAL LOW
GLUCOSE: 226 mg/dL — AB (ref 65–99)
Osmolality: 285 (ref 275–301)
Potassium: 4 mmol/L (ref 3.5–5.1)
Sodium: 136 mmol/L (ref 136–145)

## 2014-03-29 LAB — PROTIME-INR
INR: 3
PROTHROMBIN TIME: 30.2 s — AB (ref 11.5–14.7)

## 2014-03-29 LAB — MAGNESIUM: Magnesium: 1.8 mg/dL

## 2014-03-30 LAB — PROTIME-INR
INR: 3.6
PROTHROMBIN TIME: 34.9 s — AB (ref 11.5–14.7)

## 2014-03-31 LAB — PROTIME-INR
INR: 3.1
PROTHROMBIN TIME: 31.3 s — AB (ref 11.5–14.7)

## 2014-04-02 DIAGNOSIS — R0602 Shortness of breath: Secondary | ICD-10-CM | POA: Diagnosis not present

## 2014-04-02 DIAGNOSIS — J449 Chronic obstructive pulmonary disease, unspecified: Secondary | ICD-10-CM | POA: Diagnosis not present

## 2014-04-02 DIAGNOSIS — I4891 Unspecified atrial fibrillation: Secondary | ICD-10-CM | POA: Diagnosis not present

## 2014-04-17 DIAGNOSIS — R0602 Shortness of breath: Secondary | ICD-10-CM | POA: Diagnosis not present

## 2014-04-18 DIAGNOSIS — R0602 Shortness of breath: Secondary | ICD-10-CM | POA: Diagnosis not present

## 2014-04-24 DIAGNOSIS — I82409 Acute embolism and thrombosis of unspecified deep veins of unspecified lower extremity: Secondary | ICD-10-CM | POA: Diagnosis not present

## 2014-05-07 DIAGNOSIS — I4891 Unspecified atrial fibrillation: Secondary | ICD-10-CM | POA: Diagnosis not present

## 2014-05-07 DIAGNOSIS — J449 Chronic obstructive pulmonary disease, unspecified: Secondary | ICD-10-CM | POA: Diagnosis not present

## 2014-05-09 DIAGNOSIS — K219 Gastro-esophageal reflux disease without esophagitis: Secondary | ICD-10-CM | POA: Diagnosis not present

## 2014-05-09 DIAGNOSIS — I4891 Unspecified atrial fibrillation: Secondary | ICD-10-CM | POA: Diagnosis not present

## 2014-05-09 DIAGNOSIS — I10 Essential (primary) hypertension: Secondary | ICD-10-CM | POA: Diagnosis not present

## 2014-05-09 DIAGNOSIS — I80299 Phlebitis and thrombophlebitis of other deep vessels of unspecified lower extremity: Secondary | ICD-10-CM | POA: Diagnosis not present

## 2014-05-09 DIAGNOSIS — Z7901 Long term (current) use of anticoagulants: Secondary | ICD-10-CM | POA: Diagnosis not present

## 2014-05-09 DIAGNOSIS — E119 Type 2 diabetes mellitus without complications: Secondary | ICD-10-CM | POA: Diagnosis not present

## 2014-05-13 DIAGNOSIS — E119 Type 2 diabetes mellitus without complications: Secondary | ICD-10-CM | POA: Diagnosis not present

## 2014-05-13 DIAGNOSIS — I10 Essential (primary) hypertension: Secondary | ICD-10-CM | POA: Diagnosis not present

## 2014-05-13 DIAGNOSIS — Z7901 Long term (current) use of anticoagulants: Secondary | ICD-10-CM | POA: Diagnosis not present

## 2014-05-13 DIAGNOSIS — E039 Hypothyroidism, unspecified: Secondary | ICD-10-CM | POA: Diagnosis not present

## 2014-05-22 DIAGNOSIS — I82409 Acute embolism and thrombosis of unspecified deep veins of unspecified lower extremity: Secondary | ICD-10-CM | POA: Diagnosis not present

## 2014-06-11 ENCOUNTER — Ambulatory Visit: Payer: Self-pay | Admitting: Internal Medicine

## 2014-06-11 DIAGNOSIS — Z1231 Encounter for screening mammogram for malignant neoplasm of breast: Secondary | ICD-10-CM | POA: Diagnosis not present

## 2014-06-19 DIAGNOSIS — I82409 Acute embolism and thrombosis of unspecified deep veins of unspecified lower extremity: Secondary | ICD-10-CM | POA: Diagnosis not present

## 2014-06-25 DIAGNOSIS — E1142 Type 2 diabetes mellitus with diabetic polyneuropathy: Secondary | ICD-10-CM | POA: Diagnosis not present

## 2014-06-25 DIAGNOSIS — E119 Type 2 diabetes mellitus without complications: Secondary | ICD-10-CM | POA: Diagnosis not present

## 2014-06-25 DIAGNOSIS — D518 Other vitamin B12 deficiency anemias: Secondary | ICD-10-CM | POA: Diagnosis not present

## 2014-06-25 DIAGNOSIS — J449 Chronic obstructive pulmonary disease, unspecified: Secondary | ICD-10-CM | POA: Diagnosis not present

## 2014-06-25 DIAGNOSIS — I4891 Unspecified atrial fibrillation: Secondary | ICD-10-CM | POA: Diagnosis not present

## 2014-06-25 DIAGNOSIS — Z7901 Long term (current) use of anticoagulants: Secondary | ICD-10-CM | POA: Diagnosis not present

## 2014-06-25 DIAGNOSIS — I10 Essential (primary) hypertension: Secondary | ICD-10-CM | POA: Diagnosis not present

## 2014-07-17 DIAGNOSIS — I82409 Acute embolism and thrombosis of unspecified deep veins of unspecified lower extremity: Secondary | ICD-10-CM | POA: Diagnosis not present

## 2014-08-08 DIAGNOSIS — Z23 Encounter for immunization: Secondary | ICD-10-CM | POA: Diagnosis not present

## 2014-08-14 DIAGNOSIS — I82409 Acute embolism and thrombosis of unspecified deep veins of unspecified lower extremity: Secondary | ICD-10-CM | POA: Diagnosis not present

## 2014-09-12 DIAGNOSIS — I82409 Acute embolism and thrombosis of unspecified deep veins of unspecified lower extremity: Secondary | ICD-10-CM | POA: Diagnosis not present

## 2014-10-09 DIAGNOSIS — I82409 Acute embolism and thrombosis of unspecified deep veins of unspecified lower extremity: Secondary | ICD-10-CM | POA: Diagnosis not present

## 2014-10-22 DIAGNOSIS — J309 Allergic rhinitis, unspecified: Secondary | ICD-10-CM | POA: Diagnosis not present

## 2014-10-22 DIAGNOSIS — N393 Stress incontinence (female) (male): Secondary | ICD-10-CM | POA: Diagnosis not present

## 2014-10-22 DIAGNOSIS — I119 Hypertensive heart disease without heart failure: Secondary | ICD-10-CM | POA: Diagnosis not present

## 2014-10-22 DIAGNOSIS — E1122 Type 2 diabetes mellitus with diabetic chronic kidney disease: Secondary | ICD-10-CM | POA: Diagnosis not present

## 2014-10-22 DIAGNOSIS — K5900 Constipation, unspecified: Secondary | ICD-10-CM | POA: Diagnosis not present

## 2014-10-22 DIAGNOSIS — M810 Age-related osteoporosis without current pathological fracture: Secondary | ICD-10-CM | POA: Diagnosis not present

## 2014-10-29 DIAGNOSIS — J449 Chronic obstructive pulmonary disease, unspecified: Secondary | ICD-10-CM | POA: Diagnosis not present

## 2014-10-29 DIAGNOSIS — H6993 Unspecified Eustachian tube disorder, bilateral: Secondary | ICD-10-CM | POA: Diagnosis not present

## 2014-10-29 DIAGNOSIS — R0602 Shortness of breath: Secondary | ICD-10-CM | POA: Diagnosis not present

## 2014-11-06 DIAGNOSIS — I482 Chronic atrial fibrillation: Secondary | ICD-10-CM | POA: Diagnosis not present

## 2014-11-12 DIAGNOSIS — R42 Dizziness and giddiness: Secondary | ICD-10-CM | POA: Diagnosis not present

## 2014-12-03 DIAGNOSIS — R413 Other amnesia: Secondary | ICD-10-CM | POA: Diagnosis not present

## 2014-12-03 DIAGNOSIS — W19XXXA Unspecified fall, initial encounter: Secondary | ICD-10-CM | POA: Diagnosis not present

## 2014-12-03 DIAGNOSIS — R2689 Other abnormalities of gait and mobility: Secondary | ICD-10-CM | POA: Diagnosis not present

## 2014-12-04 DIAGNOSIS — I482 Chronic atrial fibrillation: Secondary | ICD-10-CM | POA: Diagnosis not present

## 2014-12-19 ENCOUNTER — Ambulatory Visit: Payer: Self-pay | Admitting: Neurology

## 2014-12-19 DIAGNOSIS — R413 Other amnesia: Secondary | ICD-10-CM | POA: Diagnosis not present

## 2014-12-19 DIAGNOSIS — R531 Weakness: Secondary | ICD-10-CM | POA: Diagnosis not present

## 2014-12-19 DIAGNOSIS — S0990XA Unspecified injury of head, initial encounter: Secondary | ICD-10-CM | POA: Diagnosis not present

## 2014-12-19 DIAGNOSIS — G319 Degenerative disease of nervous system, unspecified: Secondary | ICD-10-CM | POA: Diagnosis not present

## 2014-12-19 DIAGNOSIS — R2689 Other abnormalities of gait and mobility: Secondary | ICD-10-CM | POA: Diagnosis not present

## 2015-01-01 DIAGNOSIS — I482 Chronic atrial fibrillation: Secondary | ICD-10-CM | POA: Diagnosis not present

## 2015-01-14 ENCOUNTER — Ambulatory Visit: Payer: Self-pay | Admitting: Internal Medicine

## 2015-01-14 DIAGNOSIS — R05 Cough: Secondary | ICD-10-CM | POA: Diagnosis not present

## 2015-01-14 DIAGNOSIS — R0602 Shortness of breath: Secondary | ICD-10-CM | POA: Diagnosis not present

## 2015-01-14 DIAGNOSIS — J449 Chronic obstructive pulmonary disease, unspecified: Secondary | ICD-10-CM | POA: Diagnosis not present

## 2015-01-21 DIAGNOSIS — E119 Type 2 diabetes mellitus without complications: Secondary | ICD-10-CM | POA: Diagnosis not present

## 2015-01-21 DIAGNOSIS — R0602 Shortness of breath: Secondary | ICD-10-CM | POA: Diagnosis not present

## 2015-01-21 DIAGNOSIS — J309 Allergic rhinitis, unspecified: Secondary | ICD-10-CM | POA: Diagnosis not present

## 2015-01-21 DIAGNOSIS — J449 Chronic obstructive pulmonary disease, unspecified: Secondary | ICD-10-CM | POA: Diagnosis not present

## 2015-01-29 DIAGNOSIS — R2689 Other abnormalities of gait and mobility: Secondary | ICD-10-CM | POA: Diagnosis not present

## 2015-01-29 DIAGNOSIS — I482 Chronic atrial fibrillation: Secondary | ICD-10-CM | POA: Diagnosis not present

## 2015-02-03 DIAGNOSIS — J301 Allergic rhinitis due to pollen: Secondary | ICD-10-CM | POA: Diagnosis not present

## 2015-02-05 ENCOUNTER — Ambulatory Visit: Payer: Self-pay | Admitting: Internal Medicine

## 2015-02-05 DIAGNOSIS — J849 Interstitial pulmonary disease, unspecified: Secondary | ICD-10-CM | POA: Diagnosis not present

## 2015-02-05 DIAGNOSIS — J984 Other disorders of lung: Secondary | ICD-10-CM | POA: Diagnosis not present

## 2015-02-05 DIAGNOSIS — E041 Nontoxic single thyroid nodule: Secondary | ICD-10-CM | POA: Diagnosis not present

## 2015-02-05 DIAGNOSIS — R918 Other nonspecific abnormal finding of lung field: Secondary | ICD-10-CM | POA: Diagnosis not present

## 2015-02-05 DIAGNOSIS — R05 Cough: Secondary | ICD-10-CM | POA: Diagnosis not present

## 2015-02-20 DIAGNOSIS — R911 Solitary pulmonary nodule: Secondary | ICD-10-CM | POA: Diagnosis not present

## 2015-02-20 DIAGNOSIS — J301 Allergic rhinitis due to pollen: Secondary | ICD-10-CM | POA: Diagnosis not present

## 2015-02-20 DIAGNOSIS — R05 Cough: Secondary | ICD-10-CM | POA: Diagnosis not present

## 2015-02-25 DIAGNOSIS — I482 Chronic atrial fibrillation: Secondary | ICD-10-CM | POA: Diagnosis not present

## 2015-02-25 DIAGNOSIS — Z0001 Encounter for general adult medical examination with abnormal findings: Secondary | ICD-10-CM | POA: Diagnosis not present

## 2015-02-25 DIAGNOSIS — J449 Chronic obstructive pulmonary disease, unspecified: Secondary | ICD-10-CM | POA: Diagnosis not present

## 2015-02-25 DIAGNOSIS — E119 Type 2 diabetes mellitus without complications: Secondary | ICD-10-CM | POA: Diagnosis not present

## 2015-02-25 DIAGNOSIS — R300 Dysuria: Secondary | ICD-10-CM | POA: Diagnosis not present

## 2015-02-25 DIAGNOSIS — I10 Essential (primary) hypertension: Secondary | ICD-10-CM | POA: Diagnosis not present

## 2015-02-25 DIAGNOSIS — J301 Allergic rhinitis due to pollen: Secondary | ICD-10-CM | POA: Diagnosis not present

## 2015-03-04 DIAGNOSIS — E119 Type 2 diabetes mellitus without complications: Secondary | ICD-10-CM | POA: Diagnosis not present

## 2015-03-05 DIAGNOSIS — I482 Chronic atrial fibrillation: Secondary | ICD-10-CM | POA: Diagnosis not present

## 2015-03-08 NOTE — Discharge Summary (Signed)
PATIENT NAME:  Monica [name], Monica [name] MR#:  [id number] DATE OF BIRTH:  04/25/1925  DATE OF ADMISSION:  03/28/2014 DATE OF DISCHARGE:  03/31/2014  DISCHARGE DIAGNOSES: 1.  Chronic obstructive pulmonary disease exacerbation. 2.  Hypertension.  3.  Diabetes mellitus type 2.    4.  Recurrent deep vein thrombosis.  5.  chronic diastolic  heart failure.   DISCHARGE MEDICATIONS: 1.  Coumadin 4 mg p.o. daily in the morning.  2.  Glipizide 2.5 mg p.o. daily.  3.  Amlodipine with benazepril 5/20 mg p.o. daily.  4.  Oxybutynin 10 mg 1 tablet daily.  5.  Neurontin 300 mg per capsule once a day.  6.  Aspirin 81 mg p.o. daily.  7.  Super B 1 tablet daily.  8.  MiraLAX 17 grams daily for constipation.  9.  Pravastatin 40 mg p.o. daily.  10.  Metformin 500 mg 1-1/2 tablets daily.  11.  Furosemide 20 mg p.o. b.i.d.  12.  Spiriva 18 mcg inhalation daily.  New medication. 13.  Fluticasone with salmeterol 250/50, 1 puff b.i.d. New medication. 14.  Azithromycin 250 mg daily for 5 days.  15.  Prednisone 20 mg 3 tablets daily for 2 days, 2 tablets daily for 2 days and 1 tablet daily for 2 days; stop after that.  16.  Vitamin D2, 50,000 units 1 capsule daily.   CONSULTATIONS: Pulmonary consult with Dr. Mortimer Fries.   HOSPITAL COURSE: The patient is an 81 year old female patient with history of chronic obstructive pulmonary disease, came in because of trouble breathing. The patient also had some lower extremity edema, cough and orthopnea before she came. The patient admitted for:  1.  Chronic obstructive pulmonary disease exacerbation. The patient's CT chest on admission did not show any pulmonary emboli. The patient had significant wheezing on admission. The patient was started on IV steroids, nebulizers and antibiotics. The patient clinically improved. Initially, she was on high dose steroids at 80 mg q.6 hours, then we have decreased is slowly, and the patient's wheezing and coughing improved nicely. The patient  discharged home with p.o. steroids, along with antibiotics and nebulizers. The patient wanted to see Dr. Devona Konig for pulmonary consult. Dr. Mortimer Fries saw the patient. He added Spiriva and Advair to her regimen, and we have asked the patient and patient's family to call Dr. Derrek Gu office to follow up with him regarding her COPD and possible pulmonary function testing as an outpatient. The patient did not qualify for home oxygen because the patient's O2 sats were 94%   at rest  and 95% with exertion on room air.  2.  History of deep vein thrombosis and pulmonary embolism. The patient's CT chest did not show any pulmonary emboli, and we continued the Coumadin.  3.  Possible congestive heart failure, seen by cardiology from [place]. The patient's echocardiogram showed EF of more than 55%.  She was taking Lasix at home, but that was stopped when she was here in April.  The patient did have hyponatremia; that prompted the Lasix to be held, so she was not taking Lasix since April 22nd.  We started the Lasix back on her and she can continue that.  4.  Diabetes mellitus type 2. She is on glipizide and metformin. We continued that.   5.  History of hyperlipidemia. She is on statins. We continued that.   LABORATORY DATA DURING THE HOSPITAL STAY:  Chest x-ray showed no acute cardiopulmonary abnormality. Electrolytes were sodium was 137, potassium 4.2, chloride 26, chloride  103, bicarb 26, BUN 13, creatinine 0.82. Troponin is less than 0.02. WBC 12.6, hemoglobin 13.8, hematocrit 42.7, platelets 286 on admission.   BNP 311.   CT angio chest was done on admission, which showed bilateral lower lobe scarring. No pulmonary emboli. Patchy ground-glass change, which may be related to scarring.   The patient's troponins negative x 3. Echocardiogram showed EF of 55% to 60% with normal LV systolic function with impaired relaxation of diastolic filling.   DISCHARGE VITAL SIGNS:  Heart rate is 87, temperature 97.7. Blood  pressure 108/57. The patient's sats are 94% percent on room air and 95% with exertion.   PHYSICAL EXAMINATION: CARDIOVASCULAR: S1, S2 regular.   LUNGS: Clear to auscultation. No wheeze. No rales.  GASTROINTESTINAL: Abdomen soft, nontender, nondistended. Bowel sounds present.  The patient discharged to independent [facility] facility.   The patient is from [facility] independent living facility for 2-1/2 years.   TIME SPENT:  More than 30 minutes.    PRIMARY CARE PHYSICIAN:  Dr. Caryn Section.  ____________________________ Epifanio Lesches, MD sk:dmm D: 04/01/2014 11:11:06 ET T: 04/01/2014 11:38:41 ET JOB#: [id number]  cc: Epifanio Lesches, MD, <Dictator> Kirstie Peri. Caryn Section, MD Epifanio Lesches MD ELECTRONICALLY SIGNED 04/10/2014 15:42

## 2015-03-08 NOTE — Discharge Summary (Signed)
PATIENT NAME:  Monica [name], Monica [name] MR#:  [id number] DATE OF BIRTH:  04/25/1925  DATE OF ADMISSION:  03/04/2014 DATE OF DISCHARGE:  03/06/2014  REASON FOR ADMISSION: Weakness and shakiness.   DISCHARGE DIAGNOSES: 1.  Mild urinary tract infection.  2.  Hyponatremia.  3.  Generalized weakness due to above.  4.  Hypertension.  5.  Type 2 diabetes.  6.  History of recurrent phlebitis.  7.  Chronic Coumadin use.   DISPOSITION: Home. The patient was offered to have home health but she refused it.   MEDICATIONS:   1.  Warfarin 2 mg every morning on Monday, the rest of the days 4 mg.  2.  Glipizide 2.5 mg daily.  3.  Amlodipine with benazepril 5/20 mg once a day.  4.  Vitamin D 50,000 units once a week.  5.  Oxybutynin 10 mg once a day.  6.  Gabapentin 300 mg take 2 tablets at bedtime.  7.  Aspirin 81 mg daily.  8.  Complex vitamin B once a day.  9.  Metformin 500 mg once a day with supper.  10.  MiraLax 17 grams once a day.  11.  Pravastatin 40 mg once a day.  12.  Ciprofloxacin 250 mg twice 1 tablet twice daily for 3 days.   FOLLOWUPLelon Huh in 7 days.   HOSPITAL COURSE:  This is a very nice 81 year old female who presented to the Emergency Department with a chief complaint of shakiness and weakness. Please refer to history of present illness for more detail, dictated on 03/04/2014. The patient thought that her blood sugars were down but they were overall normal, decided to come to the Emergency Department where she was evaluated. Her urinalysis had increased amount of white blood cells up to 61, positive leukocyte esterase, negative nitrites, no bacteria seen for which she started treatment for urinary tract infection. The patient also was found to have hyponatremia and overall she states that she drinks a lot of water. The patient was admitted, evaluated for physical therapy and then discharged home.  1.  Weakness and shakiness. This is likely secondary to combination of things.  2.   Possible urinary tract infection. Urine culture was negative although the patient had significant dysuria and changes of her UA for what we are going to give her complete treatment for 5 days with ciprofloxacin.  3.  The patient takes Coumadin for which she needs to watch her levels very closely. She actually is one who will her own levels at home and she can do it every day and report to her primary care physician.  4.  As far as her hyponatremia, this is also a cause of the patient being weak and shaky. The patient has been drinking over a gallon of water a day because she gets really thirsty and she thought that would be healthy. On top of that, she is taking hydrochlorothiazide. Would recommend the patient to decrease the amount of water to have with the amount that she drinks instead of 64 ounces somewhere around 34 or 32.  Stop her hydrochlorothiazide as her blood pressure has been remaining stable.  5.  For her blood pressure, continue Lotrel.  6.  The patient actually was able to ambulate with physical therapy without any problems. She feels strong enough to go back to her home which is an independent living facility.   CONDITION ON DISCHARGE:  The patient is discharged in good condition.   TIME SPENT: I spent about 45  minutes discharging this patient.   ____________________________ Sour John Sink, MD rsg:cs D: 03/06/2014 14:04:00 ET T: 03/06/2014 15:43:15 ET JOB#: [id number]  cc: Buckley Sink, MD, <Dictator> Venora Kautzman America Brown MD ELECTRONICALLY SIGNED 03/19/2014 23:06

## 2015-03-08 NOTE — H&P (Signed)
PATIENT NAME:  Monica [name], Monica [name] MR#:  [id number] DATE OF BIRTH:  04/25/1925  DATE OF ADMISSION:  03/28/2014  REFERRING PHYSICIAN:  Dr. Thomasene Lot.  PRIMARY CARE PHYSICIAN:  Dr. Caryn Section.  CHIEF COMPLAINT:  Shortness of breath.   HISTORY OF PRESENT ILLNESS:  This is an 81 year old female who presents with complaints of shortness of breath, mainly orthopnea over the last few days, as well bilateral lower extremity edema and complains of cough as well, the patient is known to have history of COPD in the past, was never on home oxygen, as well the patient is known to have history of recurrent DVT where she is on warfarin with her INR being 2.1, the patient reports her shortness of breath has been going on for a few weeks, but has worsened recently, over the last 4 to 5 days where she currently sleeps on two pillows days, even reports yesterday she could not sleep on the bed so she had to sleep on a recliner, the patient reports she was on Lasix which was stopped during her discharge from recent hospitalization in April for hyponatremia, as well she had significant wheezing upon presentation which did improve with nebulizer treatment as well, the patient had CT chest angiogram to rule out PE which was negative for PE, the patient denies chest pain, any hemoptysis, any productive sputum, any fever, any chills, any dysuria, hospitalist service were requested to admit and evaluate the patient for her shortness of breath, the patient denies any history of congestive heart failure in the past, reports history of COPD, but never admitted for COPD exacerbation, but reports she has been on by mouth prednisone in the past.   PAST MEDICAL HISTORY: 1.  Hypertension.  2.  Diabetes.  3.  Degenerative disk disease.  4.  Asthma.  5.  COPD.  6.  Recurrent DVT, on warfarin.  7.  Tonsillectomy.  8.  Bladder tuck surgery.  9.  Hysterectomy.   FAMILY HISTORY:  Significant for diabetes and kidney disease.   SOCIAL HISTORY:   The patient quit smoking 20 years ago.  No alcohol.  No drug use.   ALLERGIES:  CELEBREX, CODEINE, PENICILLIN, SULFA DRUGS, TYLENOL AND DUST.   HOME MEDICATIONS: 1.  Aspirin 81 mg daily.  2.  Warfarin 4 mg oral daily.  3.  Gabapentin 600 mg oral at bedtime.  4.  Glipizide 2.5 mg oral daily.  5.  Metformin 500 mg oral 1.5 tablets daily.  6.  Pravastatin 40 mg oral at bedtime.  7.  Lotrel 5/20 mg 1 tablet oral daily.  8.  MiraLAX daily.  9.  Oxybutynin 10 mg daily.  10.  B complex 1 tablet daily.   REVIEW OF SYSTEMS:  CONSTITUTIONAL:  Denies fever, chills, fatigue, weakness, weight gain, weight loss.  EYES:  Denies blurry vision, double vision, inflammation, glaucoma.  EARS, NOSE, THROAT:  Denies tinnitus, ear pain, hearing loss, epistaxis or discharge.  RESPIRATORY:  Reports cough.  Denies any productive sputum.  Reports wheezing and shortness of breath and reports history of COPD.  CARDIOVASCULAR:  Denies chest pain.  Reports orthopnea and edema.  Denies any palpitations or syncope.  GASTROINTESTINAL:  Denies nausea, vomiting, diarrhea, abdominal pain, hematemesis.  GENITOURINARY:  Denies dysuria, hematuria, or renal colic.  ENDOCRINE:  Denies polyuria, polydipsia, heat or cold intolerance.  HEMATOLOGY:  Denies anemia, easy bruising, bleeding diathesis.  INTEGUMENT:  Denies acne, rash or skin incontinence.  MUSCULOSKELETAL:  Denies any gout, arthritis or cramps.  NEUROLOGIC:  Denies  history of CVA, TIA, headache, tremors, vertigo.  PSYCHIATRIC:  Denies anxiety, insomnia, or depression.   PHYSICAL EXAMINATION: VITAL SIGNS:  Temperature 97.7, pulse 87, respiratory rate 20, blood pressure 141/68, saturating 97% on oxygen.  GENERAL:  Well-nourished female who looks comfortable in bed in no apparent distress.  HEENT:  Head atraumatic, normocephalic.  Pupils equal and reactive to light.  Pink conjunctivae.  Anicteric sclerae.  Moist oral mucosa.  NECK:  Supple.  No thyromegaly.  No JVD.   CHEST:  Good air entry bilaterally with wheezing and bibasilar crackles.  CARDIOVASCULAR:  S1, S2 heard.  No rubs, murmurs, or gallops.  ABDOMEN:  Soft, nontender, nondistended.  Bowel sounds present.  EXTREMITIES:  +1 edema bilaterally.  No clubbing.  No cyanosis.  pedal and radial pulses +2 bilaterally.  PSYCHIATRIC:  Appropriate affect.  Awake, alert x 3.  Intact judgment and insight.  NEUROLOGIC:  Cranial nerves grossly intact.  Motor five out of five.  No focal deficits.   JOINT:   No effusion or erythema.  SKIN:  Normal skin turgor.  Warm and dry.  LYMPHATIC:  No cervical lymphadenopathy could be appreciated.   IMAGING STUDIES:  CT chest angiogram showing no evidence of PE and bilateral lower lobe scarring and patchy ground-glass changes, which may be related to scarring and mild changes consistent with a granulomatous disease.    ASSESSMENT AND PLAN: 1.  Chronic obstructive pulmonary disease exacerbation, the patient is known to have history of chronic obstructive pulmonary disease who presents with significant wheezing and cough.  She will be started on Solu-Medrol and nebulizer treatment.  She will be kept on DuoNebs every four hours and as needed albuterol.  2.  Congestive heart failure from the patient's symptoms orthopnea and worsening lower extremity edema, that appears to be new onset congestive heart failure.  We will obtain a 2-D echocardiogram.  We will start the patient on low-dose Lasix.  We will consult cardiology service to evaluate the patient.  3.  History of diabetes.  We will hold all hypoglycemic agents, especially metformin as she received intravenous contrast.  We will have her on insulin sliding scale.  4.  History of recurrent deep vein thrombosis in the past.  The patient will be continued on warfarin with our pharmacy to dose.  Her INR is therapeutic currently.  5.  Hypertension:  Blood pressure is acceptable.  Continue with home dose.  6.  Deep vein thrombosis  prophylaxis.  The patient is on full dose anticoagulation with warfarin.  7.  CODE STATUS:  DISCUSSED WITH THE PATIENT AND DAUGHTER AT THE BEDSIDE.  THE PATIENT REPORTS SHE IS A FULL CODE, BUT IF SHE BECOMES IN A VEGETATIVE STATE, DOES NOT WISH TO REMAIN ON LIFE SUPPORT.   Total time spent on admission and patient care 55 minutes.     ____________________________ Albertine Patricia, MD dse:ea D: 03/28/2014 01:16:02 ET T: 03/28/2014 02:05:16 ET JOB#: [id number]  cc: Albertine Patricia, MD, <Dictator> Teliah Buffalo Graciela Husbands MD ELECTRONICALLY SIGNED 03/29/2014 2:02

## 2015-03-08 NOTE — H&P (Signed)
PATIENT NAME:  Monica [name], Monica [name] MR#:  [id number] DATE OF BIRTH:  04/25/1925  DATE OF ADMISSION:  03/04/2014  PRIMARY CARE PHYSICIAN: Dr. Caryn Section.  REFERRING EMERGENCY ROOM PHYSICIAN: Dr. Jasmine December.   CHIEF COMPLAINT: Shakiness and weakness   HISTORY OF PRESENT ILLNESS: An 81 year old female who lives in [place] assisted living facility, able to do her day-to-day activities, today morning she was feeling very weak and was feeling shaky. She thought that her sugar might have dropped so she tried to check her blood sugar level, but was feeling very shaky, so she drank one Coke, had one orange and also had 4 Hershey bars, and after that she checked her blood sugar level, it was 200. She called her daughter after that who went over there and found the patient a little bit clammy and shaking, so she decided to call the ambulance and brought her to Emergency Room. In the ER, she was found having positive urinalysis, and so being admitted for urinary tract infection.   REVIEW OF SYSTEMS:  CONSTITUTIONAL: Negative for fever, but she said that she had some chills, have also has some weakness. No weight loss or weight gain.  EYES: No blurring, double vision, discharge or redness.  EARS, NOSE, THROAT: No tinnitus, ear pain or hearing loss.  RESPIRATORY: No cough, wheezing, hemoptysis or shortness of breath.  CARDIOVASCULAR: No chest pain, orthopnea, edema, arrhythmia or palpitations.  GASTROINTESTINAL: No nausea, vomiting, diarrhea, abdominal pain.  GENITOURINARY: The patient denies any change in her urinary habits or any worsening or new smell in her urine. Denies any increased frequency.  ENDOCRINE: No heat or cold intolerance. No excessive sweating.  SKIN: No acne, rashes, or lesions.  MUSCULOSKELETAL: No swelling or pain in the joints.  NEUROLOGICAL: No numbness, weakness, but the patient had some shaking tremors.  PSYCHIATRIC: No anxiety, insomnia, bipolar disorder.   PAST MEDICAL HISTORY: 1.  Hypertension.  2. Diabetes.  3. Bulging disk in lower back.  4. Asthma.  5. Recurrent phlebitis and is on chronic Coumadin therapy now.  6. Tonsillectomy.  7. Bladder tack surgery.  8. Hysterectomy secondary to tumor, noncancer.    FAMILY HISTORY: Significant for diabetes in brother. The patient's brother also had bone cancer.  One of her sister's had kidney cancer and two sisters had stroke. Her father had lung cancer, which had spread to his brain and he died because of that. Her mother died when she was 71 years old after childbirth.   SOCIAL HISTORY: Significant for smoking. She was a smoker for years, but she quit 20 years ago, but she always smoked just a few cigarettes a day, 1 pack was lasting 3 to 4 days. She did not drink alcohol. No illegal drug use. Retired from [employer]. She lives at retirement community and he uses cane and a walker sometimes.   MEDICATIONS: At home:  1. Coumadin 4 mg oral tablet once a day, except Monday. On Monday. She take 1/2 tablet 4 mg.  2. Vitamin D2 50,000 international units oral once a week.  3. Vitamin B complex once a day.  4. Oxybutynin 10 mg oral tablet once a day.  5. Metformin 500 mg oral tablet, take 2 tablets 2 times a day.  6. Hydrochlorothiazide 25 mg oral tablet once a day.  7. Glipizide 2.5 mg oral tablet extended-release once a day.  8. Gabapentin 300 mg oral two capsules once a day.  9. Aspirin 81 mg once a day.  10. Amlodipine and benazepril 5/20  mg oral capsule once a day.   PHYSICAL EXAMINATION:  VITAL SIGNS: In ER, temperature 99.8, pulse 107, respirations 18, blood pressure 124/65, pulse oximetry is 98% on room air.  GENERAL: The patient is fully alert and oriented to time, place, and person. Currently does not appear in any acute distress, have some headache.  HEENT: Head and neck atraumatic. Conjunctiva pink. Oral mucosa moist.  NECK: Supple. No JVD.  RESPIRATORY: Bilateral clear and equal air entry.    CARDIOVASCULAR: S1, S2 present, regular. No murmur.  ABDOMEN: Soft, tender bowel sounds present. No organomegaly.  SKIN: No rashes.  LEGS: No edema.  NEUROLOGIC: Power five out of five. Follows commands. No gross abnormality.  PSYCHIATRIC: Does not appear in any acute psychiatric illness.  EXTREMITIES: No flank tenderness.   IMPORTANT LABORATORY RESULTS: WBC 15,000, hemoglobin 13.2, platelet count is 296,  MCV is 83. Glucose is 161, creatinine 0.88, sodium 127, potassium is 4.0 chloride is 95, CO2 is 25. Urinalysis is grossly positive with 61 WBC and 3+ leukocyte esterase with cloudy urine. Chest x-ray, PA and lateral, shows lingular scar, no active disease.   ASSESSMENT AND PLAN: A 81 year old female with a past medical history of diabetes, hypertension, asthma and recurrent phlebitis, presented to Emergency Room with feeling shaky and found having positive urinalysis.  1. Urinary tract infection. Will treat with IV Levaquin and pharmacy to adjust the dose. Urine culture and blood cultures were sent by Emergency Room. Advised to follow that.  2. Shakiness and generalized weakness. Most likely this was due to urinary tract infection, but  we would like to have physical therapy evaluation once she recovers from her symptoms to evaluate her status and requirement on discharge.  3. Hypertension We will continue hydrochlorothiazide as she was taking at home.  4. Diabetes. We will continue metformin 1000 mg b.i.d. and glipizide 2.5 mg as she was taking at home and we will put her on insulin sliding scale coverage.  5. Recurrent phlebitis. This was in the past and she is taking chronic Coumadin therapy for that. We will check INR tomorrow and we will continue Coumadin here in the hospital.  6. CODE STATUS is DO NOT RESUSCITATE. I confirmed with patient's daughter, who is present in the room currently, and the patient is also fully alert and oriented and she expressed her views like this.   TOTAL TIME  SPENT ON THIS ADMISSION: 50 minutes.    ____________________________ Ceasar Lund Anselm Jungling, MD vgv:sg D: 03/04/2014 18:49:51 ET T: 03/04/2014 19:10:07 ET JOB#: [id number]  cc: Ceasar Lund. Anselm Jungling, MD, <Dictator> Kirstie Peri. Caryn Section, MD  Vaughan Basta MD ELECTRONICALLY SIGNED 03/10/2014 13:13

## 2015-03-08 NOTE — Consult Note (Signed)
General Aspect PCP: Dr. Caryn Section Primary Cardiologist: New to [facility]  81 y/o female w/o prior cardiac hx who was admitted with a 10 day h/o progressive dyspnea/wheezing/cough, and we've been asked to eval re: possible contribution of CHF.   Present Illness 81 y/o female with a h/o HTN, DM, HL, recurrent LE DVT (last ~ 8 yrs ago) on chronic warfarin, remote tob abuse (quit 20 yrs ago), and COPD (uses prn albuterol @ home).  She has no prior cardiac hx.  In January of this year she was dx with bronchitis @ a local urgent care and placed on abx.  She recovered w/o incident.  She lives in a retirement community.  She is relatively sedentary secondary peripheral neuropathy and chronic foot pain.  That said, she does report some degree of chronic DOE.  In April, she was admitted to [facility] with a complaint of shakiness and was found to have a UTI.  She was treated and discharged after a 2 day stay.  She did well until about 10 days ago, when she began to develop progressive dyspnea, wheezing, and cough.  Her dtr took her to her PCP's office on Monday and she was placed on azithromycin.  A CXR was performed on the 11th, which did not show any acute cardiopulm dzs.  Unfortunately, despite abx, her dyspnea/wheezing only progressed and she also developed orthopnea requiring her to sleep in her recliner on Tuesday night.  She called back her PCP Wed afternoon and a Rx was called in for levaquin.  Unfortunately, dyspnea progressed throughout the day and she presented to the ED last night.  There, CXR again did not show any evidence of acute cardiopulm dzd.  CTA of the chest was performed 2/2 h/o DVT's (INR therapeutic @ 2.1), and did not show PE.  Scarring, patchy ground glass changes, and mild  granulomatous changes were noted.  No evidence of pulmonary edema.  BNP was nl @ 311.  Pt was admitted and has been placed on steroids and nebulizers with some improvement.  Oral lasix, which she was on prior to her  hospitalization in April, has also been added to her regimen.   Of note, she has some degree of chronic dependent LEE.  This is what she was on lasix for in the past.  This has not changed recently.  Appetite has dropped off some. No PND. No c/p.   Physical Exam:  GEN pleasant, nad.   HEENT pink conjunctivae, hearing intact to voice, moist oral mucosa   NECK supple  No masses  no bruits/jvd.   RESP normal resp effort  no use of accessory muscles  wheezing  significant insp/exp wheezing heard from across the room with nl resp effort.   CARD Regular rate and rhythm  Normal, S1, S2  No murmur   ABD denies tenderness  soft  normal BS   LYMPH negative neck   EXTR negative cyanosis/clubbing, trace bilat LE edema.   SKIN normal to palpation   NEURO follows commands, motor/sensory function intact, grossly intact, nonfocal.   PSYCH alert, A+O to time, place, person   Review of Systems:  General: No Complaints   Skin: No Complaints   ENT: No Complaints   Eyes: No Complaints   Neck: No Complaints   Respiratory: Short of breath  Wheezing  cough   Cardiovascular: Dyspnea  Orthopnea  2 pillows-->sleeping in recliner.   Gastrointestinal: No Complaints   Genitourinary: No Complaints   Vascular: No Complaints   Musculoskeletal:  chronic bilat foot pain - peripheral neuropathy.   Neurologic: No Complaints   Hematologic: No Complaints   Endocrine: No Complaints   Psychiatric: No Complaints   Review of Systems: All other systems were reviewed and found to be negative   Medications/Allergies Reviewed Medications/Allergies reviewed   Family & Social History:  Family and Social History:  Family History mother died with pna following childbirth @ age 39.  Father died of metastatic lung/brain cancer.   Social History negative ETOH, negative Illicit drugs, quit smoking ~ 20 yrs ago.  Says she never smoked heavily but did smoke most of her adult life.   Place of Living  Lives in a retirement home.  Retired from [employer].  Dtr nearby and involved in care.     Remote Tobacco Abuse: a. Quit ~ 1995   Recurrent LE DVT's: a. chronic coumadin.   COPD:    Peripheral Neuropathy: a. with chronic bilat foot pain.   DJD:    Hyperlipidemia:    Mini strokes:    Asthma:    restless leg symdrome:    Hypertension:    Diabetes Mellitus, Type II (NIDD):    S/P Bladder Tack:    tonsillectomy:    Hysterectomy - Partial:   Home Medications: Medication Instructions Status  warfarin 4 mg oral tablet 1 tab(s) orally once a day (in the morning) Active  glipiZIDE 2.5 mg oral tablet, extended release 1 tab(s) orally once a day Active  amLODIPine-benazepril 5 mg-20 mg oral capsule 1 cap(s) orally once a day (at supper time) Active  Vitamin D2 50,000 intl units oral capsule 1 cap(s) orally once a week on Saturday Active  oxybutynin 10 mg/24 hr oral tablet, extended release 1 tab(s) orally once a day Active  gabapentin 300 mg oral capsule 2 cap(s) orally once a day (at bedtime) Active  Aspirin Low Dose 81 mg oral delayed release tablet 1 tab(s) orally once a day Active  Super B Complex Vitamin B Complex oral tablet 1 tab(s) orally once a day Active  MiraLax 17 gram(s) orally once a day Active  pravastatin 40 mg oral tablet 1 tab(s) orally once a day (at bedtime) Active  metFORMIN 500 mg oral tablet 1.5 tab(s) (774m) orally once a day Active   Lab Results:  LabObservation:  14-May-15 07:46   OBSERVATION Reason for Test  Routine Chem:  13-May-15 20:27   Glucose, Serum  182  BUN 13  Creatinine (comp) 0.82  Sodium, Serum 137  Potassium, Serum 4.2  Chloride, Serum 103  CO2, Serum 26  Calcium (Total), Serum 9.5  Anion Gap 8  Osmolality (calc) 279  eGFR (African American) >60  eGFR (Non-African American) >60 (eGFR values <639mmin/1.73 m2 may be an indication of chronic kidney disease (CKD). Calculated eGFR is useful in patients with stable renal  function. The eGFR calculation will not be reliable in acutely ill patients when serum creatinine is changing rapidly. It is not useful in  patients on dialysis. The eGFR calculation may not be applicable to patients at the low and high extremes of body sizes, pregnant women, and vegetarians.)    20:29   B-Type Natriuretic Peptide ([facility]) 311 (Result(s) reported on 28 Mar 2014 at 01:26AM.)  Cardiac:  13-May-15 20:27   Troponin I < 0.02 (0.00-0.05 0.05 ng/mL or less: NEGATIVE  Repeat testing in 3-6 hrs  if clinically indicated. >0.05 ng/mL: POTENTIAL  MYOCARDIAL INJURY. Repeat  testing in 3-6 hrs if  clinically indicated. NOTE: An increase or decrease  of  30% or more on serial  testing suggests a  clinically important change)  14-May-15 02:08   CPK-MB, Serum 2.8 (Result(s) reported on 28 Mar 2014 at 03:41AM.)  Troponin I < 0.02 (0.00-0.05 0.05 ng/mL or less: NEGATIVE  Repeat testing in 3-6 hrs  if clinically indicated. >0.05 ng/mL: POTENTIAL  MYOCARDIAL INJURY. Repeat  testing in 3-6 hrs if  clinically indicated. NOTE: An increase or decrease  of 30% or more on serial  testing suggests a  clinically important change)    05:58   CPK-MB, Serum 2.4 (Result(s) reported on 28 Mar 2014 at 06:36AM.)  Troponin I < 0.02 (0.00-0.05 0.05 ng/mL or less: NEGATIVE  Repeat testing in 3-6 hrs  if clinically indicated. >0.05 ng/mL: POTENTIAL  MYOCARDIAL INJURY. Repeat  testing in 3-6 hrs if  clinically indicated. NOTE: An increase or decrease  of 30% or more on serial  testing suggests a  clinically important change)    09:07   CPK-MB, Serum 2.4 (Result(s) reported on 28 Mar 2014 at 09:42AM.)  Routine Coag:  13-May-15 20:27   Prothrombin  23.3  INR 2.1 (INR reference interval applies to patients on anticoagulant therapy. A single INR therapeutic range for coumarins is not optimal for all indications; however, the suggested range for most indications is 2.0 - 3.0. Exceptions to  the INR Reference Range may include: Prosthetic heart valves, acute myocardial infarction, prevention of myocardial infarction, and combinations of aspirin and anticoagulant. The need for a higher or lower target INR must be assessed individually. Reference: The Pharmacology and Management of the Vitamin K  antagonists: the seventh ACCP Conference on Antithrombotic and Thrombolytic Therapy. JHERD.4081 Sept:126 (3suppl): [id number]. A HCT value >55% may artifactually increase the PT.  In one study,  the increase was an average of 25%. Reference:  "Effect on Routine and Special Coagulation Testing Values of Citrate Anticoagulant Adjustment in Patients with High HCT Values." American Journal of Clinical Pathology 2006;126:400-405.)  Routine Hem:  13-May-15 20:27   WBC (CBC)  12.6  RBC (CBC) 5.18  Hemoglobin (CBC) 13.8  Hematocrit (CBC) 42.7  Platelet Count (CBC) 286 (Result(s) reported on 27 Mar 2014 at 09:04PM.)  MCV 82  MCH 26.6  MCHC 32.3  RDW  14.8   EKG:  EKG Interp. by me   Interpretation sinus tach, 128, pac's, no acute s/t changes.   Radiology Results: XRay:    13-May-15 20:55, Chest PA and Lateral  Chest PA and Lateral   REASON FOR EXAM:    SOB; NP cough; wheezing  COMMENTS:       PROCEDURE: DXR - DXR CHEST PA (OR AP) AND LATERAL  - Mar 27 2014  8:55PM     CLINICAL DATA:  Cough and congestion    EXAM:  CHEST  2 VIEW    COMPARISON:  03/25/2014    FINDINGS:  Cardiac shadow is stable. Mild scarring is again noted in the bases.  No focal confluent infiltrate or sizable effusion is seen. No acute  bony abnormality is noted.     IMPRESSION:  No acute abnormality seen.  Stable scarring in the bases.      Electronically Signed    By: Inez Catalina M.D.    On: 03/27/2014 21:01         Verified By: Everlene Farrier, M.D.,  CT:    13-May-15 22:27, CT Angiography Chest  CT Angiography Chest   REASON FOR EXAM:    pt iwth hx of DVT in past now with shortness  of    breath, palpitations, tachycardi  COMMENTS:       PROCEDURE: CT  - CT ANGIOGRAPHY CHEST W/CONTRAST  - Mar 27 2014 10:27PM     CLINICAL DATA:  Chest pain, shortness of breath    EXAM:  CT ANGIOGRAPHY CHEST WITH CONTRAST    TECHNIQUE:  Multidetector CT imaging of the chest was performed using the  standard protocol during bolus administration of intravenous  contrast. Multiplanar CT image reconstructions and MIPs were  obtained to evaluate the vascular anatomy.    CONTRAST:  100 mL Isovue 370    COMPARISON:  Chest x-ray from earlier in the same day.    FINDINGS:  The lungs are well aerated bilaterally. Bilateral lower lobe  scarring is noted similar to that seen on the recentplain film.  Mild patchy ground-glass changes are noted particularly in the right  lung. A few small calcifications are noted consistent with  granulomatous disease. The thoracic aorta and its branch vessels are  within normal limits. The pulmonary artery is incompletely opacified  due to timing of the contrast bolus. No large central pulmonary  embolus is seen. No mediastinal or hilar adenopathy is noted.  Coronary calcifications are seen. Visualized upper abdomen is within  normal limits. The osseous structures are within normal limits.    Review of the MIP images confirms the above findings.     IMPRESSION:  No evidence of pulmonary embolism although the pulmonary artery S  less than optimally visualized.    Bilateral lower lobe scarring.    Patchy ground-glass changes which may be related to scarring.    Mild changes consistent with granulomatous disease.  Electronically Signed    By: Inez Catalina M.D.    On: 03/27/2014 22:55         Verified By: Everlene Farrier, M.D.,    Codeine: Hives  Penicillin: Hives  Celebrex: Hives  Sulfa drugs: Hives  Dust: Hives  Tylenol: Other  Vital Signs/Nurse's Notes: **Vital Signs.:   14-May-15 01:54  Temperature Temperature (F) 97.7  Celsius 36.5   Temperature Source oral  Pulse Pulse 73  Respirations Respirations 20  Systolic BP Systolic BP 882  Diastolic BP (mmHg) Diastolic BP (mmHg) 72  Mean BP 88  Pulse Ox % Pulse Ox % 97  Pulse Ox Activity Level  At rest  Oxygen Delivery 2L    04:20  Vital Signs Type Routine  Temperature Temperature (F) 98.6  Celsius 37  Temperature Source oral  Pulse Pulse 91  Respirations Respirations 20  Systolic BP Systolic BP 800  Diastolic BP (mmHg) Diastolic BP (mmHg) 68  Mean BP 82  Pulse Ox % Pulse Ox % 98  Pulse Ox Activity Level  At rest  Oxygen Delivery 2L  *Intake and Output.:   14-May-15 10:18  Grand Totals Intake:  0 Output:  0    Net:  0 24 Hr.:  0  Urinary Method  Void; Up to BR  Stool  Patient up to the bathroom, had a large formed bowel movement.    Impression 1.  Acute Exacerbation of COPD:  Pt with a 10 day h/o progressive dyspnea, wheezing, and cough.  She does report orthopnea in the past 2-3 days and also has some degree of chronic dependent lower extremity edema.  CXR/CTA chest did not show any evidence of pulmonary edema and BNP is nl.  No evidence of significant volume overload on exam.  Oral lasix resumed.  Echo pending.  Cont mgmt of COPD  flare - nebs/steroids.  She was also receiving abx @ home - defer to IM.  She is afebrile however wbc sl elevated in ER yesterday.   2.  HTN: stable.  Cont CCB/Acei combo.  3.  HL: on statin.  4. DM:  SSI per IM.  On metformin & glipizide @ home.  5.  Recurrent LE DVT's:  On coumadin & INR therapeutic.  CTA neg for PE.  6.  Peripheral Neuropathy:  With chronic bilat foot pain.  On neurontin.   Electronic Signatures for Addendum Section:  Kathlyn Sacramento (MD) (Signed Addendum [phone] 17:48)  The patient was seen and examined. Agree with the above. She presented with dyspnea, orthopnea and wheezing. No signs of fluid overloal. BNP is close to normal and echo showed normal LVSF with only mild diastolic dysfunction and no evidence  of pulmonary hypertension. Doubt CHF. I suspect that symptoms are due to COPD.   Electronic Signatures: Kathlyn Sacramento (MD)  (Signed 14-May-15 17:48)  Co-Signer: General Aspect/Present Illness, History and Physical Exam, Review of System, Family & Social History, Past Medical History, Home Medications, Labs, EKG , Radiology, Allergies, Vital Signs/Nurse's Notes, Impression/Plan Rogelia Mire (NP)  (Signed 14-May-15 10:35)  Authored: General Aspect/Present Illness, History and Physical Exam, Review of System, Family & Social History, Past Medical History, Home Medications, Labs, EKG , Radiology, Allergies, Vital Signs/Nurse's Notes, Impression/Plan   Last Updated: 14-May-15 17:48 by Kathlyn Sacramento (MD)

## 2015-03-17 DIAGNOSIS — L821 Other seborrheic keratosis: Secondary | ICD-10-CM | POA: Diagnosis not present

## 2015-04-02 DIAGNOSIS — I482 Chronic atrial fibrillation: Secondary | ICD-10-CM | POA: Diagnosis not present

## 2015-05-02 ENCOUNTER — Other Ambulatory Visit: Payer: Self-pay | Admitting: Family Medicine

## 2015-05-05 DIAGNOSIS — I482 Chronic atrial fibrillation: Secondary | ICD-10-CM | POA: Diagnosis not present

## 2015-05-08 ENCOUNTER — Other Ambulatory Visit: Payer: Self-pay | Admitting: Family Medicine

## 2015-05-08 DIAGNOSIS — E119 Type 2 diabetes mellitus without complications: Secondary | ICD-10-CM

## 2015-05-09 ENCOUNTER — Other Ambulatory Visit: Payer: Self-pay | Admitting: Family Medicine

## 2015-05-26 DIAGNOSIS — J301 Allergic rhinitis due to pollen: Secondary | ICD-10-CM | POA: Diagnosis not present

## 2015-05-26 DIAGNOSIS — J449 Chronic obstructive pulmonary disease, unspecified: Secondary | ICD-10-CM | POA: Diagnosis not present

## 2015-05-26 DIAGNOSIS — I482 Chronic atrial fibrillation: Secondary | ICD-10-CM | POA: Diagnosis not present

## 2015-05-26 DIAGNOSIS — R05 Cough: Secondary | ICD-10-CM | POA: Diagnosis not present

## 2015-06-03 ENCOUNTER — Other Ambulatory Visit: Payer: Self-pay | Admitting: Family Medicine

## 2015-06-07 DIAGNOSIS — I482 Chronic atrial fibrillation: Secondary | ICD-10-CM | POA: Diagnosis not present

## 2015-06-26 DIAGNOSIS — E782 Mixed hyperlipidemia: Secondary | ICD-10-CM | POA: Diagnosis not present

## 2015-06-26 DIAGNOSIS — I10 Essential (primary) hypertension: Secondary | ICD-10-CM | POA: Diagnosis not present

## 2015-06-26 DIAGNOSIS — I482 Chronic atrial fibrillation: Secondary | ICD-10-CM | POA: Diagnosis not present

## 2015-06-26 DIAGNOSIS — E119 Type 2 diabetes mellitus without complications: Secondary | ICD-10-CM | POA: Diagnosis not present

## 2015-06-26 DIAGNOSIS — J449 Chronic obstructive pulmonary disease, unspecified: Secondary | ICD-10-CM | POA: Diagnosis not present

## 2015-07-07 DIAGNOSIS — Z1211 Encounter for screening for malignant neoplasm of colon: Secondary | ICD-10-CM | POA: Diagnosis not present

## 2015-07-07 DIAGNOSIS — E782 Mixed hyperlipidemia: Secondary | ICD-10-CM | POA: Diagnosis not present

## 2015-07-07 DIAGNOSIS — Z0001 Encounter for general adult medical examination with abnormal findings: Secondary | ICD-10-CM | POA: Diagnosis not present

## 2015-07-07 DIAGNOSIS — E1165 Type 2 diabetes mellitus with hyperglycemia: Secondary | ICD-10-CM | POA: Diagnosis not present

## 2015-07-07 DIAGNOSIS — E559 Vitamin D deficiency, unspecified: Secondary | ICD-10-CM | POA: Diagnosis not present

## 2015-07-09 DIAGNOSIS — I482 Chronic atrial fibrillation: Secondary | ICD-10-CM | POA: Diagnosis not present

## 2015-07-15 NOTE — Patient Outreach (Signed)
[facility]) Care Management  07/15/2015  Monica [name] [birth year] [id number]   Referral from [place] [facility], assigned Janci Minor, RN to outreach.  Thanks, [name]. [place], [place] Assistant Phone: [phone] Fax: [phone]

## 2015-07-17 ENCOUNTER — Other Ambulatory Visit: Payer: Self-pay | Admitting: *Deleted

## 2015-07-17 NOTE — Patient Outreach (Signed)
RNCM called Monica [name] and explained [facility] services. Monica [name] became angry and stated she didn't need any help and for me to never call her again. RNCM attempted to further explain [facility] and the fact that it was her choice. Pt hung up on RNCM.  Plan: RNCM will make [facility] aware pt declined services.   Rutherford Limerick RN, BSN  [facility] Care Management [phone])

## 2015-07-23 NOTE — Patient Outreach (Signed)
[facility]) Care Management  07/23/2015  Monica [name] 1926-07-10 [id number]   Notification from [place], RN to close case due to patient refused [place] [facility] services.  Thanks, [name]. [place], [place] Assistant Phone: [phone] Fax: [phone]

## 2015-08-06 DIAGNOSIS — I482 Chronic atrial fibrillation: Secondary | ICD-10-CM | POA: Diagnosis not present

## 2015-08-14 DIAGNOSIS — Z23 Encounter for immunization: Secondary | ICD-10-CM | POA: Diagnosis not present

## 2015-09-03 DIAGNOSIS — I482 Chronic atrial fibrillation: Secondary | ICD-10-CM | POA: Diagnosis not present

## 2015-09-09 DIAGNOSIS — R05 Cough: Secondary | ICD-10-CM | POA: Diagnosis not present

## 2015-09-09 DIAGNOSIS — J301 Allergic rhinitis due to pollen: Secondary | ICD-10-CM | POA: Diagnosis not present

## 2015-09-09 DIAGNOSIS — J449 Chronic obstructive pulmonary disease, unspecified: Secondary | ICD-10-CM | POA: Diagnosis not present

## 2015-09-09 DIAGNOSIS — I482 Chronic atrial fibrillation: Secondary | ICD-10-CM | POA: Diagnosis not present

## 2015-10-02 DIAGNOSIS — I482 Chronic atrial fibrillation: Secondary | ICD-10-CM | POA: Diagnosis not present

## 2015-10-08 ENCOUNTER — Other Ambulatory Visit: Payer: Self-pay | Admitting: Family Medicine

## 2015-10-27 DIAGNOSIS — J449 Chronic obstructive pulmonary disease, unspecified: Secondary | ICD-10-CM | POA: Diagnosis not present

## 2015-10-27 DIAGNOSIS — R2231 Localized swelling, mass and lump, right upper limb: Secondary | ICD-10-CM | POA: Diagnosis not present

## 2015-10-27 DIAGNOSIS — I10 Essential (primary) hypertension: Secondary | ICD-10-CM | POA: Diagnosis not present

## 2015-10-27 DIAGNOSIS — M5010 Cervical disc disorder with radiculopathy, unspecified cervical region: Secondary | ICD-10-CM | POA: Diagnosis not present

## 2015-10-27 DIAGNOSIS — E119 Type 2 diabetes mellitus without complications: Secondary | ICD-10-CM | POA: Diagnosis not present

## 2015-10-27 DIAGNOSIS — I482 Chronic atrial fibrillation: Secondary | ICD-10-CM | POA: Diagnosis not present

## 2015-10-30 ENCOUNTER — Other Ambulatory Visit: Payer: Self-pay | Admitting: Family Medicine

## 2015-10-30 DIAGNOSIS — I482 Chronic atrial fibrillation: Secondary | ICD-10-CM | POA: Diagnosis not present

## 2015-10-31 NOTE — Telephone Encounter (Signed)
Please advise patient we received request for prescription refill, but she is due for follow up office visit and needs to make an appointment before we can approve refill. Thank.s

## 2015-11-06 NOTE — Telephone Encounter (Signed)
Called and spoke with patient. I advised her that the pharmacy sent [place] a refill request  For Oxybutynin. Before I advised her that she needs an appointment she told me that she doesn't know why the pharmacy contacted our office because she is no longer a patient here. She states she has established with another Primary care.

## 2015-11-12 DIAGNOSIS — R2231 Localized swelling, mass and lump, right upper limb: Secondary | ICD-10-CM | POA: Diagnosis not present

## 2015-11-26 DIAGNOSIS — I482 Chronic atrial fibrillation: Secondary | ICD-10-CM | POA: Diagnosis not present

## 2015-11-28 DIAGNOSIS — R05 Cough: Secondary | ICD-10-CM | POA: Diagnosis not present

## 2015-11-28 DIAGNOSIS — I482 Chronic atrial fibrillation: Secondary | ICD-10-CM | POA: Diagnosis not present

## 2015-11-28 DIAGNOSIS — Z7901 Long term (current) use of anticoagulants: Secondary | ICD-10-CM | POA: Diagnosis not present

## 2015-11-28 DIAGNOSIS — J069 Acute upper respiratory infection, unspecified: Secondary | ICD-10-CM | POA: Diagnosis not present

## 2015-11-28 DIAGNOSIS — J449 Chronic obstructive pulmonary disease, unspecified: Secondary | ICD-10-CM | POA: Diagnosis not present

## 2015-11-28 DIAGNOSIS — R2231 Localized swelling, mass and lump, right upper limb: Secondary | ICD-10-CM | POA: Diagnosis not present

## 2015-11-28 DIAGNOSIS — E119 Type 2 diabetes mellitus without complications: Secondary | ICD-10-CM | POA: Diagnosis not present

## 2015-12-01 ENCOUNTER — Encounter: Payer: Self-pay | Admitting: General Surgery

## 2015-12-02 ENCOUNTER — Other Ambulatory Visit: Payer: Self-pay | Admitting: Family Medicine

## 2015-12-09 ENCOUNTER — Ambulatory Visit (INDEPENDENT_AMBULATORY_CARE_PROVIDER_SITE_OTHER): Payer: Medicare Other | Admitting: General Surgery

## 2015-12-09 ENCOUNTER — Encounter: Payer: Self-pay | Admitting: General Surgery

## 2015-12-09 VITALS — BP 120/74 | HR 76 | Resp 16 | Ht 64.0 in | Wt 148.0 lb

## 2015-12-09 DIAGNOSIS — R2231 Localized swelling, mass and lump, right upper limb: Secondary | ICD-10-CM | POA: Diagnosis not present

## 2015-12-09 NOTE — Patient Instructions (Addendum)
Patient to return for right arm mass excision . Patient to stop Coumadin five days before office procedures

## 2015-12-09 NOTE — Progress Notes (Signed)
Patient ID: Monica [name], female   DOB: 1926-07-13, 82 y.o.   MRN: [id number]  Chief Complaint  Patient presents with  . Mass    right arm    HPI Monica [name] is a 82 y.o. female here today for a evaluation of a right arm mass. She noticed this about 5 years ago. She states the area has got bigger. Patient had a ultrasound done 11/12/15. She complains of some stiffness and difficulty with lateral 3 fingers in right hand. I have reviewed the history of present illness with the patient. HPI  Past Medical History  Diagnosis Date  . COPD (chronic obstructive pulmonary disease) ([place])   . Bronchitis   . Pneumonia   . Hyperlipidemia   . Diabetes mellitus without complication ([place])   . Atrial fibrillation ([place])   . Vitamin B12 deficiency   . Hypertension   . Stroke [facility])     Past Surgical History  Procedure Laterality Date  . Abdominal hysterectomy  1975  . Bladder suspension  1989  . Colonoscopy      History reviewed. No pertinent family history.  Social History Social History  Substance Use Topics  . Smoking status: Former Smoker -- 15 years    Quit date: 11/15/1988  . Smokeless tobacco: None  . Alcohol Use: No    Allergies  Allergen Reactions  . Celecoxib Nausea And Vomiting  . Acetaminophen Itching  . Codeine Rash  . Penicillin G Rash  . Petrolatum-Zinc Oxide Rash    Current Outpatient Prescriptions  Medication Sig Dispense Refill  . albuterol (PROVENTIL HFA;VENTOLIN HFA) 108 (90 Base) MCG/ACT inhaler Inhale 1 puff into the lungs every 4 (four) hours as needed for wheezing or shortness of breath.    Marland Kitchen amLODipine-benazepril (LOTREL) 5-20 MG capsule TAKE 1 CAPSULE BY MOUTH EVERY DAY 90 capsule 1  . b complex vitamins capsule Take 1 capsule by mouth daily.    . Fluticasone Furoate-Vilanterol 100-25 MCG/INH AEPB Inhale into the lungs.    . gabapentin (NEURONTIN) 300 MG capsule Take 600 mg by mouth at bedtime.    Marland Kitchen glipiZIDE (GLUCOTROL XL) 2.5 MG 24 hr tablet TAKE  ONE TABLET BY MOUTH EVERY DAY 90 tablet 4  . hydrochlorothiazide (HYDRODIURIL) 25 MG tablet Take 25 mg by mouth daily.    . metFORMIN (GLUCOPHAGE) 500 MG tablet TAKE ONE TABLET BY MOUTH TWICE DAILY (Patient taking differently: TAKE 1 1./2 TABLET BY MOUTH TWICE DAILY) 180 tablet 3  . montelukast (SINGULAIR) 10 MG tablet Take 10 mg by mouth at bedtime.    Marland Kitchen oxybutynin (DITROPAN-XL) 10 MG 24 hr tablet TAKE ONE TABLET EVERY DAY 90 tablet 3  . pravastatin (PRAVACHOL) 40 MG tablet Take 40 mg by mouth daily.    . theophylline (THEO-24) 100 MG 24 hr capsule Take 100 mg by mouth daily.    Marland Kitchen tiotropium (SPIRIVA) 18 MCG inhalation capsule Place 18 mcg into inhaler and inhale daily.    Marland Kitchen Umeclidinium-Vilanterol (ANORO ELLIPTA) 62.5-25 MCG/INH AEPB     . warfarin (COUMADIN) 2 MG tablet Take 2 mg by mouth as directed. mondays    . warfarin (COUMADIN) 4 MG tablet TAKE ONE TABLET EVERY DAY 60 tablet 3   No current facility-administered medications for this visit.    Review of Systems Review of Systems  Constitutional: Negative.   Respiratory: Negative.   Cardiovascular: Negative.     Blood pressure 120/74, pulse 76, resp. rate 16, height 5\' 4"  (1.626 m), weight 148 lb (67.132 kg).  Physical Exam Physical Exam  Constitutional: She is oriented to person, place, and time. She appears well-developed and well-nourished.  Eyes: Conjunctivae are normal.  Neck: No thyromegaly present.  Cardiovascular: Normal rate, regular rhythm and normal heart sounds.   Pulmonary/Chest: Effort normal and breath sounds normal.  Lymphadenopathy:    She has no cervical adenopathy.    She has no axillary adenopathy.  Neurological: She is alert and oriented to person, place, and time.  Skin: Skin is warm and dry.       Data Reviewed Notes and ultrasound reviewed  Assessment    Right arm mass-likely a lipoma. Not sure if this is causing her hand symptoms. The mass is growing and feel it reasonable to excise  this. Discussed fully with pt and she is agreeable       Plan    Patient to return for right arm mass excision. Patient to stop Coumadin five days before office procedures    PCP:  Placido Sou This information has been scribed by Gaspar Cola CMA.   Zephan Beauchaine G 12/09/2015, 1:09 PM

## 2015-12-17 ENCOUNTER — Encounter: Payer: Self-pay | Admitting: General Surgery

## 2015-12-17 ENCOUNTER — Ambulatory Visit (INDEPENDENT_AMBULATORY_CARE_PROVIDER_SITE_OTHER): Payer: Medicare Other | Admitting: General Surgery

## 2015-12-17 VITALS — BP 126/56 | HR 82 | Resp 16 | Ht 64.0 in | Wt 155.0 lb

## 2015-12-17 DIAGNOSIS — R2231 Localized swelling, mass and lump, right upper limb: Secondary | ICD-10-CM | POA: Diagnosis not present

## 2015-12-17 DIAGNOSIS — D1722 Benign lipomatous neoplasm of skin and subcutaneous tissue of left arm: Secondary | ICD-10-CM | POA: Diagnosis not present

## 2015-12-17 NOTE — Progress Notes (Signed)
Here today for excision right arm mass. She has been off her coumadin for 5 days.  Procedure: excision lipoma right forearm  Anesthetic: 10 ml 1% xylocaine, mixed wit 0.5% marcaine  Prep: Chloro Prep   Description; Area was prepped and local anesthetic instilled. Sterile towels placed.. Vertical 3cm incision made. No mass in subcutaneous tissue. Fascia was incised, Muscle fibers were spread apart. 3cm ill defined lipoma was freed and removed. The specimen was sent to pathology. Fascia closed with 3-0 vicryl/ Skin closed with subcuticular 4-0 vicryl and Dermabond applied. Procedure well tolerated.

## 2015-12-17 NOTE — Patient Instructions (Addendum)
The patient is aware to call back for any questions or concerns. Keep area clean 

## 2015-12-19 ENCOUNTER — Encounter: Payer: Self-pay | Admitting: General Surgery

## 2015-12-23 ENCOUNTER — Telehealth: Payer: Self-pay | Admitting: *Deleted

## 2015-12-23 NOTE — Telephone Encounter (Signed)
Notified patient as instructed, patient pleased. She states the area is a little sore. Discussed follow-up appointments as needed, patient agrees

## 2015-12-23 NOTE — Telephone Encounter (Signed)
-----   Message from Christene Lye, MD sent at 12/22/2015  8:39 AM EST ----- Rosann Auerbach please let pt pt know the pathology was normal.

## 2015-12-24 DIAGNOSIS — I482 Chronic atrial fibrillation: Secondary | ICD-10-CM | POA: Diagnosis not present

## 2015-12-26 DIAGNOSIS — M4802 Spinal stenosis, cervical region: Secondary | ICD-10-CM | POA: Diagnosis not present

## 2015-12-26 DIAGNOSIS — G5601 Carpal tunnel syndrome, right upper limb: Secondary | ICD-10-CM | POA: Diagnosis not present

## 2015-12-26 DIAGNOSIS — M5030 Other cervical disc degeneration, unspecified cervical region: Secondary | ICD-10-CM | POA: Diagnosis not present

## 2015-12-26 DIAGNOSIS — M5412 Radiculopathy, cervical region: Secondary | ICD-10-CM | POA: Diagnosis not present

## 2015-12-31 ENCOUNTER — Other Ambulatory Visit: Payer: Self-pay | Admitting: Family Medicine

## 2016-01-05 DIAGNOSIS — M5412 Radiculopathy, cervical region: Secondary | ICD-10-CM | POA: Diagnosis not present

## 2016-01-13 DIAGNOSIS — R05 Cough: Secondary | ICD-10-CM | POA: Diagnosis not present

## 2016-01-13 DIAGNOSIS — Z7901 Long term (current) use of anticoagulants: Secondary | ICD-10-CM | POA: Diagnosis not present

## 2016-01-13 DIAGNOSIS — J449 Chronic obstructive pulmonary disease, unspecified: Secondary | ICD-10-CM | POA: Diagnosis not present

## 2016-01-13 DIAGNOSIS — R0602 Shortness of breath: Secondary | ICD-10-CM | POA: Diagnosis not present

## 2016-01-15 DIAGNOSIS — Z7901 Long term (current) use of anticoagulants: Secondary | ICD-10-CM | POA: Diagnosis not present

## 2016-01-16 DIAGNOSIS — M5030 Other cervical disc degeneration, unspecified cervical region: Secondary | ICD-10-CM | POA: Diagnosis not present

## 2016-01-16 DIAGNOSIS — M4802 Spinal stenosis, cervical region: Secondary | ICD-10-CM | POA: Diagnosis not present

## 2016-01-16 DIAGNOSIS — M5412 Radiculopathy, cervical region: Secondary | ICD-10-CM | POA: Diagnosis not present

## 2016-01-21 DIAGNOSIS — I482 Chronic atrial fibrillation: Secondary | ICD-10-CM | POA: Diagnosis not present

## 2016-02-19 DIAGNOSIS — I482 Chronic atrial fibrillation: Secondary | ICD-10-CM | POA: Diagnosis not present

## 2016-02-23 DIAGNOSIS — Z7901 Long term (current) use of anticoagulants: Secondary | ICD-10-CM | POA: Diagnosis not present

## 2016-02-24 DIAGNOSIS — M4802 Spinal stenosis, cervical region: Secondary | ICD-10-CM | POA: Diagnosis not present

## 2016-02-24 DIAGNOSIS — M5030 Other cervical disc degeneration, unspecified cervical region: Secondary | ICD-10-CM | POA: Diagnosis not present

## 2016-02-24 DIAGNOSIS — M5412 Radiculopathy, cervical region: Secondary | ICD-10-CM | POA: Diagnosis not present

## 2016-02-26 DIAGNOSIS — J449 Chronic obstructive pulmonary disease, unspecified: Secondary | ICD-10-CM | POA: Diagnosis not present

## 2016-02-26 DIAGNOSIS — N390 Urinary tract infection, site not specified: Secondary | ICD-10-CM | POA: Diagnosis not present

## 2016-02-26 DIAGNOSIS — Z0001 Encounter for general adult medical examination with abnormal findings: Secondary | ICD-10-CM | POA: Diagnosis not present

## 2016-02-26 DIAGNOSIS — Z7901 Long term (current) use of anticoagulants: Secondary | ICD-10-CM | POA: Diagnosis not present

## 2016-02-26 DIAGNOSIS — I482 Chronic atrial fibrillation: Secondary | ICD-10-CM | POA: Diagnosis not present

## 2016-02-26 DIAGNOSIS — E119 Type 2 diabetes mellitus without complications: Secondary | ICD-10-CM | POA: Diagnosis not present

## 2016-02-26 DIAGNOSIS — I10 Essential (primary) hypertension: Secondary | ICD-10-CM | POA: Diagnosis not present

## 2016-03-18 DIAGNOSIS — I482 Chronic atrial fibrillation: Secondary | ICD-10-CM | POA: Diagnosis not present

## 2016-04-06 DIAGNOSIS — G5601 Carpal tunnel syndrome, right upper limb: Secondary | ICD-10-CM | POA: Diagnosis not present

## 2016-04-06 DIAGNOSIS — M5412 Radiculopathy, cervical region: Secondary | ICD-10-CM | POA: Diagnosis not present

## 2016-04-06 DIAGNOSIS — M5030 Other cervical disc degeneration, unspecified cervical region: Secondary | ICD-10-CM | POA: Diagnosis not present

## 2016-04-06 DIAGNOSIS — M4802 Spinal stenosis, cervical region: Secondary | ICD-10-CM | POA: Diagnosis not present

## 2016-04-07 ENCOUNTER — Other Ambulatory Visit: Payer: Self-pay | Admitting: Family Medicine

## 2016-04-08 ENCOUNTER — Encounter: Payer: Self-pay | Admitting: General Surgery

## 2016-04-15 DIAGNOSIS — I482 Chronic atrial fibrillation: Secondary | ICD-10-CM | POA: Diagnosis not present

## 2016-04-20 DIAGNOSIS — Z7901 Long term (current) use of anticoagulants: Secondary | ICD-10-CM | POA: Diagnosis not present

## 2016-04-21 DIAGNOSIS — M5412 Radiculopathy, cervical region: Secondary | ICD-10-CM | POA: Diagnosis not present

## 2016-04-21 DIAGNOSIS — M4802 Spinal stenosis, cervical region: Secondary | ICD-10-CM | POA: Diagnosis not present

## 2016-04-21 DIAGNOSIS — M5030 Other cervical disc degeneration, unspecified cervical region: Secondary | ICD-10-CM | POA: Diagnosis not present

## 2016-05-11 DIAGNOSIS — I119 Hypertensive heart disease without heart failure: Secondary | ICD-10-CM | POA: Diagnosis not present

## 2016-05-11 DIAGNOSIS — J449 Chronic obstructive pulmonary disease, unspecified: Secondary | ICD-10-CM | POA: Diagnosis not present

## 2016-05-11 DIAGNOSIS — R911 Solitary pulmonary nodule: Secondary | ICD-10-CM | POA: Diagnosis not present

## 2016-05-13 DIAGNOSIS — I482 Chronic atrial fibrillation: Secondary | ICD-10-CM | POA: Diagnosis not present

## 2016-06-04 ENCOUNTER — Other Ambulatory Visit: Payer: Self-pay | Admitting: Family Medicine

## 2016-06-08 ENCOUNTER — Other Ambulatory Visit: Payer: Self-pay | Admitting: Physician Assistant

## 2016-06-08 DIAGNOSIS — R911 Solitary pulmonary nodule: Secondary | ICD-10-CM

## 2016-06-10 DIAGNOSIS — I482 Chronic atrial fibrillation: Secondary | ICD-10-CM | POA: Diagnosis not present

## 2016-06-16 DIAGNOSIS — G5601 Carpal tunnel syndrome, right upper limb: Secondary | ICD-10-CM | POA: Diagnosis not present

## 2016-06-16 DIAGNOSIS — M4802 Spinal stenosis, cervical region: Secondary | ICD-10-CM | POA: Diagnosis not present

## 2016-06-16 DIAGNOSIS — M5412 Radiculopathy, cervical region: Secondary | ICD-10-CM | POA: Diagnosis not present

## 2016-06-16 DIAGNOSIS — M5030 Other cervical disc degeneration, unspecified cervical region: Secondary | ICD-10-CM | POA: Diagnosis not present

## 2016-06-24 ENCOUNTER — Other Ambulatory Visit: Payer: Self-pay | Admitting: Nurse Practitioner

## 2016-06-24 ENCOUNTER — Ambulatory Visit
Admission: RE | Admit: 2016-06-24 | Discharge: 2016-06-24 | Disposition: A | Discharge status: Home / Self Care | Payer: Medicare Other | Source: Ambulatory Visit | Attending: Nurse Practitioner | Admitting: Nurse Practitioner

## 2016-06-24 DIAGNOSIS — R222 Localized swelling, mass and lump, trunk: Secondary | ICD-10-CM

## 2016-06-24 DIAGNOSIS — E119 Type 2 diabetes mellitus without complications: Secondary | ICD-10-CM | POA: Diagnosis not present

## 2016-06-24 DIAGNOSIS — I10 Essential (primary) hypertension: Secondary | ICD-10-CM | POA: Diagnosis not present

## 2016-06-24 DIAGNOSIS — R2242 Localized swelling, mass and lump, left lower limb: Secondary | ICD-10-CM | POA: Insufficient documentation

## 2016-06-24 DIAGNOSIS — J309 Allergic rhinitis, unspecified: Secondary | ICD-10-CM | POA: Diagnosis not present

## 2016-06-24 DIAGNOSIS — J449 Chronic obstructive pulmonary disease, unspecified: Secondary | ICD-10-CM | POA: Diagnosis not present

## 2016-07-07 ENCOUNTER — Encounter: Payer: Self-pay | Admitting: *Deleted

## 2016-07-08 ENCOUNTER — Ambulatory Visit (INDEPENDENT_AMBULATORY_CARE_PROVIDER_SITE_OTHER): Payer: Medicare Other | Admitting: General Surgery

## 2016-07-08 ENCOUNTER — Encounter: Payer: Self-pay | Admitting: General Surgery

## 2016-07-08 VITALS — BP 132/84 | HR 70 | Resp 16 | Ht 65.0 in | Wt 157.0 lb

## 2016-07-08 DIAGNOSIS — I482 Chronic atrial fibrillation: Secondary | ICD-10-CM | POA: Diagnosis not present

## 2016-07-08 DIAGNOSIS — R229 Localized swelling, mass and lump, unspecified: Secondary | ICD-10-CM | POA: Diagnosis not present

## 2016-07-08 NOTE — Progress Notes (Signed)
Patient ID: Monica [name], female   DOB: 1926-07-24, 82 y.o.   MRN: [id number]  Chief Complaint  Patient presents with  . Other    HPI Monica [name] is a 82 y.o. female here today for a evaluation of a mass on left inner thigh. Patient staes in July she fell on her buttock. She noticed this area about two weeks ago. The area has got bigger. She had a ultrasound done at [facility]. Has some pain with walking since 06/24/16. I have reviewed the history of present illness with the patient.   . .HPI  Past Medical History:  Diagnosis Date  . Atrial fibrillation ([place])   . Bronchitis   . COPD (chronic obstructive pulmonary disease) ([place])   . Diabetes mellitus without complication ([place])   . Hyperlipidemia   . Hypertension   . Pneumonia   . Stroke ([place])   . Vitamin B12 deficiency     Past Surgical History:  Procedure Laterality Date  . ABDOMINAL HYSTERECTOMY  1975  . BLADDER SUSPENSION  1989  . COLONOSCOPY      No family history on file.  Social History Social History  Substance Use Topics  . Smoking status: Former Smoker    Years: 15.00    Quit date: 11/15/1988  . Smokeless tobacco: Not on file  . Alcohol use No    Allergies  Allergen Reactions  . Celecoxib Nausea And Vomiting  . Acetaminophen Itching  . Codeine Rash  . Penicillin G Rash  . Petrolatum-Zinc Oxide Rash    Current Outpatient Prescriptions  Medication Sig Dispense Refill  . albuterol (PROVENTIL HFA;VENTOLIN HFA) 108 (90 Base) MCG/ACT inhaler Inhale 1 puff into the lungs every 4 (four) hours as needed for wheezing or shortness of breath.    Marland Kitchen amLODipine-benazepril (LOTREL) 5-20 MG capsule TAKE 1 CAPSULE BY MOUTH EVERY DAY 90 capsule 0  . b complex vitamins capsule Take 1 capsule by mouth daily.    . Fluticasone Furoate-Vilanterol 100-25 MCG/INH AEPB Inhale into the lungs.    . gabapentin (NEURONTIN) 300 MG capsule TAKE 2 CAPSULES BY MOUTH AT BEDTIME 60 capsule 5  . glipiZIDE (GLIPIZIDE XL) 2.5 MG 24 hr tablet  Take 1 tablet (2.5 mg total) by mouth daily. PATIENT NEEDS TO SCHEDULE OFFICE VISIT FOR FOLLOW UP 90 tablet 0  . hydrochlorothiazide (HYDRODIURIL) 25 MG tablet Take 25 mg by mouth daily.    . metFORMIN (GLUCOPHAGE) 500 MG tablet TAKE ONE TABLET BY MOUTH TWICE DAILY 180 tablet 0  . montelukast (SINGULAIR) 10 MG tablet Take 10 mg by mouth at bedtime.    Marland Kitchen oxybutynin (DITROPAN-XL) 10 MG 24 hr tablet TAKE ONE TABLET EVERY DAY 90 tablet 3  . pravastatin (PRAVACHOL) 40 MG tablet Take 40 mg by mouth daily.    . theophylline (THEO-24) 100 MG 24 hr capsule Take 100 mg by mouth daily.    Marland Kitchen tiotropium (SPIRIVA) 18 MCG inhalation capsule Place 18 mcg into inhaler and inhale daily.    Marland Kitchen Umeclidinium-Vilanterol (ANORO ELLIPTA) 62.5-25 MCG/INH AEPB     . warfarin (COUMADIN) 2 MG tablet Take 2 mg by mouth as directed. mondays    . warfarin (COUMADIN) 4 MG tablet TAKE ONE TABLET EVERY DAY 60 tablet 3   No current facility-administered medications for this visit.     Review of Systems Review of Systems  Blood pressure 132/84, pulse 70, resp. rate 16, height 5\' 5"  (1.651 m), weight 157 lb (71.2 kg).  Physical Exam Physical Exam  Constitutional:  She is oriented to person, place, and time. She appears well-developed and well-nourished.  Eyes: Conjunctivae Monica normal. No scleral icterus.  Cardiovascular: Normal rate and regular rhythm.   Pulmonary/Chest: Effort normal and breath sounds normal.  Abdominal: Soft. Bowel sounds Monica normal. She exhibits no mass.  Lymphadenopathy:    She has no cervical adenopathy.    She has no axillary adenopathy.       Right: No inguinal adenopathy present.       Left: No inguinal adenopathy present.  Neurological: She is alert and oriented to person, place, and time.  Skin: Skin is warm and dry.       Data Reviewed Notes reviewed   Assessment    subcutaneous mass left upper inner thigh-hard consistency. Suspicious for neoplasm. Core biopsy recommended and she is  agreeable.    Plan    Patient to return for core biopsy of the left thigh mass. Patient to be off her Coumadin for five days  This information has been scribed by Gaspar Cola CMA.        Paulino Cork G 07/12/2016, 10:17 AM

## 2016-07-08 NOTE — Patient Instructions (Signed)
  Patient to return for biopsy of the left thigh mass. Patient to be off her Coumadin for five days

## 2016-07-12 ENCOUNTER — Encounter: Payer: Self-pay | Admitting: General Surgery

## 2016-07-13 ENCOUNTER — Encounter: Payer: Self-pay | Admitting: Internal Medicine

## 2016-07-13 ENCOUNTER — Ambulatory Visit (INDEPENDENT_AMBULATORY_CARE_PROVIDER_SITE_OTHER): Payer: Medicare Other | Admitting: General Surgery

## 2016-07-13 ENCOUNTER — Encounter: Payer: Self-pay | Admitting: General Surgery

## 2016-07-13 ENCOUNTER — Other Ambulatory Visit: Payer: Self-pay

## 2016-07-13 VITALS — BP 142/72 | Resp 14 | Ht 60.0 in | Wt 159.0 lb

## 2016-07-13 DIAGNOSIS — C7652 Malignant neoplasm of left lower limb: Secondary | ICD-10-CM | POA: Diagnosis not present

## 2016-07-13 DIAGNOSIS — R229 Localized swelling, mass and lump, unspecified: Secondary | ICD-10-CM | POA: Diagnosis not present

## 2016-07-13 NOTE — Progress Notes (Signed)
Patient ID: Monica [name], female   DOB: 1926-06-11, 82 y.o.   MRN: [id number]  Chief Complaint  Patient presents with  . Procedure    left thigh biopsy    HPI Monica [name] is a 82 y.o. female here today for  a left thigh mass core biopsy. She has not taken Coumadin for last 5 days. I have reviewed the history of present illness with the patient.  HPI  Past Medical History:  Diagnosis Date  . Atrial fibrillation ([place])   . Bronchitis   . COPD (chronic obstructive pulmonary disease) ([place])   . Diabetes mellitus without complication ([place])   . Hyperlipidemia   . Hypertension   . Pneumonia   . Stroke ([place])   . Vitamin B12 deficiency     Past Surgical History:  Procedure Laterality Date  . ABDOMINAL HYSTERECTOMY  1975  . BLADDER SUSPENSION  1989  . COLONOSCOPY      No family history on file.  Social History Social History  Substance Use Topics  . Smoking status: Former Smoker    Years: 15.00    Quit date: 11/15/1988  . Smokeless tobacco: Not on file  . Alcohol use No    Allergies  Allergen Reactions  . Celecoxib Nausea And Vomiting  . Acetaminophen Itching  . Codeine Rash  . Penicillin G Rash  . Petrolatum-Zinc Oxide Rash    Current Outpatient Prescriptions  Medication Sig Dispense Refill  . albuterol (PROVENTIL HFA;VENTOLIN HFA) 108 (90 Base) MCG/ACT inhaler Inhale 1 puff into the lungs every 4 (four) hours as needed for wheezing or shortness of breath.    Marland Kitchen amLODipine-benazepril (LOTREL) 5-20 MG capsule TAKE 1 CAPSULE BY MOUTH EVERY DAY 90 capsule 0  . b complex vitamins capsule Take 1 capsule by mouth daily.    . Fluticasone Furoate-Vilanterol 100-25 MCG/INH AEPB Inhale into the lungs.    . gabapentin (NEURONTIN) 300 MG capsule TAKE 2 CAPSULES BY MOUTH AT BEDTIME 60 capsule 5  . glipiZIDE (GLIPIZIDE XL) 2.5 MG 24 hr tablet Take 1 tablet (2.5 mg total) by mouth daily. PATIENT NEEDS TO SCHEDULE OFFICE VISIT FOR FOLLOW UP 90 tablet 0  . hydrochlorothiazide  (HYDRODIURIL) 25 MG tablet Take 25 mg by mouth daily.    . metFORMIN (GLUCOPHAGE) 500 MG tablet TAKE ONE TABLET BY MOUTH TWICE DAILY 180 tablet 0  . montelukast (SINGULAIR) 10 MG tablet Take 10 mg by mouth at bedtime.    Marland Kitchen oxybutynin (DITROPAN-XL) 10 MG 24 hr tablet TAKE ONE TABLET EVERY DAY 90 tablet 3  . pravastatin (PRAVACHOL) 40 MG tablet Take 40 mg by mouth daily.    . theophylline (THEO-24) 100 MG 24 hr capsule Take 100 mg by mouth daily.    Marland Kitchen tiotropium (SPIRIVA) 18 MCG inhalation capsule Place 18 mcg into inhaler and inhale daily.    Marland Kitchen Umeclidinium-Vilanterol (ANORO ELLIPTA) 62.5-25 MCG/INH AEPB     . warfarin (COUMADIN) 2 MG tablet Take 2 mg by mouth as directed. mondays    . warfarin (COUMADIN) 4 MG tablet TAKE ONE TABLET EVERY DAY 60 tablet 3   No current facility-administered medications for this visit.     Review of Systems Review of Systems  Constitutional: Negative.   Respiratory: Negative.   Cardiovascular: Negative.     Blood pressure (!) 142/72, resp. rate 14, height 5' (1.524 m), weight 159 lb (72.1 kg).  Physical Exam Physical Exam No change in left thigh mass Data Reviewed Prior note and [place]  Assessment  Soft tissue mass left upper inner thigh    Plan    Core biopsy with US guidance completed. No immediate problems from procedure Pt will be notified when pathology available.    This information has been scribed by Monica [name] CMA.    Monica [name] G 07/13/2016, 8:44 AM

## 2016-07-13 NOTE — Patient Instructions (Addendum)
Keep area clean. May resume coumadin tomorrow. Ice pack to area off and on for today

## 2016-07-20 ENCOUNTER — Telehealth: Payer: Self-pay | Admitting: *Deleted

## 2016-07-20 NOTE — Telephone Encounter (Signed)
Patients daughter called and stated that she talked to you on Thursday about her results and she just wanted to talk to you again to see if you have heard anything else on the results.

## 2016-07-21 ENCOUNTER — Telehealth: Payer: Self-pay

## 2016-07-21 ENCOUNTER — Other Ambulatory Visit: Payer: Self-pay

## 2016-07-21 ENCOUNTER — Other Ambulatory Visit: Payer: Self-pay | Admitting: Family Medicine

## 2016-07-21 DIAGNOSIS — C4922 Malignant neoplasm of connective and soft tissue of left lower limb, including hip: Secondary | ICD-10-CM

## 2016-07-21 NOTE — Telephone Encounter (Signed)
Dr Jamal Collin talked with the daughter in detail.

## 2016-07-21 NOTE — Addendum Note (Signed)
Addended by: Lesly Rubenstein on: 07/21/2016 11:44 AM   Modules accepted: Orders

## 2016-07-21 NOTE — Telephone Encounter (Signed)
Call to patient's daughter to inform her of the patient's scheduled PET scan. The patient is scheduled for a PET scan at [facility] on 07/26/16 at 8:30 am. She is to arrive there by 8:00 am and have nothing to eat or drink after midnight. She is aware of date, time, and instructions.

## 2016-07-26 ENCOUNTER — Ambulatory Visit
Admission: RE | Admit: 2016-07-26 | Discharge: 2016-07-26 | Disposition: A | Discharge status: Home / Self Care | Payer: Medicare Other | Source: Ambulatory Visit | Attending: General Surgery | Admitting: General Surgery

## 2016-07-26 DIAGNOSIS — R590 Localized enlarged lymph nodes: Secondary | ICD-10-CM | POA: Insufficient documentation

## 2016-07-26 DIAGNOSIS — R2242 Localized swelling, mass and lump, left lower limb: Secondary | ICD-10-CM | POA: Diagnosis not present

## 2016-07-26 DIAGNOSIS — I82412 Acute embolism and thrombosis of left femoral vein: Secondary | ICD-10-CM | POA: Insufficient documentation

## 2016-07-26 DIAGNOSIS — C4922 Malignant neoplasm of connective and soft tissue of left lower limb, including hip: Secondary | ICD-10-CM | POA: Insufficient documentation

## 2016-07-26 DIAGNOSIS — R918 Other nonspecific abnormal finding of lung field: Secondary | ICD-10-CM | POA: Insufficient documentation

## 2016-07-26 LAB — GLUCOSE, CAPILLARY: Glucose-Capillary: 137 mg/dL — ABNORMAL HIGH (ref 65–99)

## 2016-07-26 MED ORDER — FLUDEOXYGLUCOSE F - 18 (FDG) INJECTION
13.5900 | Freq: Once | INTRAVENOUS | Status: AC | PRN
  Administered 2016-07-26: 13.59 via INTRAVENOUS

## 2016-07-29 ENCOUNTER — Ambulatory Visit (INDEPENDENT_AMBULATORY_CARE_PROVIDER_SITE_OTHER): Payer: Medicare Other | Admitting: General Surgery

## 2016-07-29 ENCOUNTER — Encounter: Payer: Self-pay | Admitting: General Surgery

## 2016-07-29 ENCOUNTER — Other Ambulatory Visit: Payer: Self-pay | Admitting: General Surgery

## 2016-07-29 ENCOUNTER — Other Ambulatory Visit: Payer: Self-pay | Admitting: *Deleted

## 2016-07-29 VITALS — BP 134/64 | HR 86 | Resp 20 | Ht 63.0 in | Wt 158.0 lb

## 2016-07-29 DIAGNOSIS — R2242 Localized swelling, mass and lump, left lower limb: Secondary | ICD-10-CM

## 2016-07-29 DIAGNOSIS — C4922 Malignant neoplasm of connective and soft tissue of left lower limb, including hip: Secondary | ICD-10-CM | POA: Diagnosis not present

## 2016-07-29 NOTE — Patient Instructions (Signed)
The patient is aware to call back for any questions or concerns.  

## 2016-07-29 NOTE — Progress Notes (Signed)
Patient ID: Monica [name], female   DOB: June 15, 1926, 82 y.o.   MRN: [id number]  Chief Complaint  Patient presents with  . Follow-up    HPI Monica [name] is a 82 y.o. female here today for follow up thigh mass biopsy.  She is here with her two daughters, Monica [name] and Monica [name]. I have reviewed the history of present illness with the patient. HPI  Past Medical History:  Diagnosis Date  . Atrial fibrillation ([place])   . Bronchitis   . COPD (chronic obstructive pulmonary disease) ([place])   . Diabetes mellitus without complication ([place])   . Hyperlipidemia   . Hypertension   . Pneumonia   . Stroke ([place])   . Vitamin B12 deficiency     Past Surgical History:  Procedure Laterality Date  . ABDOMINAL HYSTERECTOMY  1975  . BLADDER SUSPENSION  1989  . COLONOSCOPY      No family history on file.  Social History Social History  Substance Use Topics  . Smoking status: Former Smoker    Years: 15.00    Quit date: 11/15/1988  . Smokeless tobacco: Never Used  . Alcohol use No    Allergies  Allergen Reactions  . Celecoxib Nausea And Vomiting  . Acetaminophen Itching  . Codeine Rash  . Penicillin G Rash  . Petrolatum-Zinc Oxide Rash    Current Outpatient Prescriptions  Medication Sig Dispense Refill  . albuterol (PROVENTIL HFA;VENTOLIN HFA) 108 (90 Base) MCG/ACT inhaler Inhale 1 puff into the lungs every 4 (four) hours as needed for wheezing or shortness of breath.    Marland Kitchen amLODipine-benazepril (LOTREL) 5-20 MG capsule TAKE 1 CAPSULE BY MOUTH EVERY DAY 90 capsule 0  . b complex vitamins capsule Take 1 capsule by mouth daily.    . Fluticasone Furoate-Vilanterol 100-25 MCG/INH AEPB Inhale into the lungs.    . gabapentin (NEURONTIN) 300 MG capsule TAKE 2 CAPSULES BY MOUTH AT BEDTIME 60 capsule 5  . glipiZIDE (GLIPIZIDE XL) 2.5 MG 24 hr tablet Take 1 tablet (2.5 mg total) by mouth daily. PATIENT NEEDS TO SCHEDULE OFFICE VISIT FOR FOLLOW UP 90 tablet 0  . hydrochlorothiazide (HYDRODIURIL) 25 MG  tablet Take 25 mg by mouth daily.    . metFORMIN (GLUCOPHAGE) 500 MG tablet TAKE ONE TABLET BY MOUTH TWICE DAILY 180 tablet 0  . montelukast (SINGULAIR) 10 MG tablet Take 10 mg by mouth at bedtime.    Marland Kitchen oxybutynin (DITROPAN-XL) 10 MG 24 hr tablet TAKE ONE TABLET EVERY DAY 90 tablet 3  . pravastatin (PRAVACHOL) 40 MG tablet Take 40 mg by mouth daily.    . theophylline (THEO-24) 100 MG 24 hr capsule Take 100 mg by mouth daily.    Marland Kitchen tiotropium (SPIRIVA) 18 MCG inhalation capsule Place 18 mcg into inhaler and inhale daily.    Marland Kitchen Umeclidinium-Vilanterol (ANORO ELLIPTA) 62.5-25 MCG/INH AEPB     . warfarin (COUMADIN) 2 MG tablet Take 2 mg by mouth as directed. mondays    . warfarin (COUMADIN) 4 MG tablet TAKE ONE TABLET EVERY DAY 60 tablet 3   No current facility-administered medications for this visit.     Review of Systems Review of Systems  Constitutional: Negative.   Respiratory: Negative.   Cardiovascular: Negative.     Blood pressure 134/64, pulse 86, resp. rate 20, height 5\' 3"  (1.6 m), weight 158 lb (71.7 kg).  Physical Exam Physical Exam  Constitutional: She is oriented to person, place, and time. She appears well-developed and well-nourished.  Neurological: She is alert and  oriented to person, place, and time.  Skin: Skin is warm and dry.  Psychiatric: Her behavior is normal.    Data Reviewed Pathology and PET scan  Assessment    Path- malignant lesion, poorly differentiated, favoring sarcoma, possible metastatic carcinoma. PET- few small hypermetabolic nodules in right lung, 2 nodes in left iliac artery region. Left thigh mass appears to involve the femoral vein.    Plan    Had a detailed discussion with pt and her daughters. Feel she needs both medical and radiation oncology eval. She is amenable to these consultations. Pt voiced that she wants the maximum done for this.     This information has been scribed by Gaspar Cola CMA.    SANKAR,SEEPLAPUTHUR  G 08/05/2016, 7:52 AM

## 2016-07-30 ENCOUNTER — Institutional Professional Consult (permissible substitution): Payer: Medicare Other | Admitting: Radiation Oncology

## 2016-07-30 LAB — SLIDE CONSULT, PATHOLOGY ARMC

## 2016-08-02 ENCOUNTER — Encounter: Payer: Self-pay | Admitting: Radiation Oncology

## 2016-08-02 ENCOUNTER — Inpatient Hospital Stay: Payer: Medicare Other | Attending: Internal Medicine | Admitting: Internal Medicine

## 2016-08-02 ENCOUNTER — Inpatient Hospital Stay: Payer: Medicare Other

## 2016-08-02 ENCOUNTER — Ambulatory Visit
Admission: RE | Admit: 2016-08-02 | Discharge: 2016-08-02 | Disposition: A | Discharge status: Home / Self Care | Payer: Medicare Other | Source: Ambulatory Visit | Attending: Radiation Oncology | Admitting: Radiation Oncology

## 2016-08-02 VITALS — BP 144/74 | HR 83 | Temp 97.3°F | Wt 160.3 lb

## 2016-08-02 DIAGNOSIS — Z800 Family history of malignant neoplasm of digestive organs: Secondary | ICD-10-CM | POA: Insufficient documentation

## 2016-08-02 DIAGNOSIS — Z7984 Long term (current) use of oral hypoglycemic drugs: Secondary | ICD-10-CM | POA: Diagnosis not present

## 2016-08-02 DIAGNOSIS — R590 Localized enlarged lymph nodes: Secondary | ICD-10-CM | POA: Insufficient documentation

## 2016-08-02 DIAGNOSIS — Z8701 Personal history of pneumonia (recurrent): Secondary | ICD-10-CM

## 2016-08-02 DIAGNOSIS — R918 Other nonspecific abnormal finding of lung field: Secondary | ICD-10-CM | POA: Insufficient documentation

## 2016-08-02 DIAGNOSIS — C4922 Malignant neoplasm of connective and soft tissue of left lower limb, including hip: Secondary | ICD-10-CM | POA: Insufficient documentation

## 2016-08-02 DIAGNOSIS — Z7901 Long term (current) use of anticoagulants: Secondary | ICD-10-CM | POA: Diagnosis not present

## 2016-08-02 DIAGNOSIS — Z86718 Personal history of other venous thrombosis and embolism: Secondary | ICD-10-CM

## 2016-08-02 DIAGNOSIS — J40 Bronchitis, not specified as acute or chronic: Secondary | ICD-10-CM | POA: Diagnosis not present

## 2016-08-02 DIAGNOSIS — E538 Deficiency of other specified B group vitamins: Secondary | ICD-10-CM

## 2016-08-02 DIAGNOSIS — Z8669 Personal history of other diseases of the nervous system and sense organs: Secondary | ICD-10-CM | POA: Diagnosis not present

## 2016-08-02 DIAGNOSIS — C7989 Secondary malignant neoplasm of other specified sites: Secondary | ICD-10-CM | POA: Insufficient documentation

## 2016-08-02 DIAGNOSIS — Z79899 Other long term (current) drug therapy: Secondary | ICD-10-CM | POA: Diagnosis not present

## 2016-08-02 DIAGNOSIS — C801 Malignant (primary) neoplasm, unspecified: Secondary | ICD-10-CM | POA: Diagnosis not present

## 2016-08-02 DIAGNOSIS — I10 Essential (primary) hypertension: Secondary | ICD-10-CM | POA: Insufficient documentation

## 2016-08-02 DIAGNOSIS — Z8673 Personal history of transient ischemic attack (TIA), and cerebral infarction without residual deficits: Secondary | ICD-10-CM | POA: Insufficient documentation

## 2016-08-02 DIAGNOSIS — E119 Type 2 diabetes mellitus without complications: Secondary | ICD-10-CM | POA: Insufficient documentation

## 2016-08-02 DIAGNOSIS — I4891 Unspecified atrial fibrillation: Secondary | ICD-10-CM | POA: Insufficient documentation

## 2016-08-02 DIAGNOSIS — J449 Chronic obstructive pulmonary disease, unspecified: Secondary | ICD-10-CM | POA: Diagnosis not present

## 2016-08-02 DIAGNOSIS — E785 Hyperlipidemia, unspecified: Secondary | ICD-10-CM | POA: Insufficient documentation

## 2016-08-02 DIAGNOSIS — Z510 Encounter for antineoplastic radiation therapy: Secondary | ICD-10-CM | POA: Insufficient documentation

## 2016-08-02 DIAGNOSIS — R2242 Localized swelling, mass and lump, left lower limb: Secondary | ICD-10-CM | POA: Diagnosis not present

## 2016-08-02 DIAGNOSIS — Z87891 Personal history of nicotine dependence: Secondary | ICD-10-CM | POA: Insufficient documentation

## 2016-08-02 LAB — COMPREHENSIVE METABOLIC PANEL
ALBUMIN: 4.3 g/dL (ref 3.5–5.0)
ALT: 18 U/L (ref 14–54)
AST: 26 U/L (ref 15–41)
Alkaline Phosphatase: 86 U/L (ref 38–126)
Anion gap: 10 (ref 5–15)
BILIRUBIN TOTAL: 0.3 mg/dL (ref 0.3–1.2)
BUN: 12 mg/dL (ref 6–20)
CHLORIDE: 94 mmol/L — AB (ref 101–111)
CO2: 24 mmol/L (ref 22–32)
CREATININE: 0.8 mg/dL (ref 0.44–1.00)
Calcium: 9.2 mg/dL (ref 8.9–10.3)
GFR calc Af Amer: >60 mL/min (ref >60)
GFR calc non Af Amer: >60 mL/min (ref >60)
GLUCOSE: 81 mg/dL (ref 65–99)
POTASSIUM: 3.9 mmol/L (ref 3.5–5.1)
Sodium: 128 mmol/L — ABNORMAL LOW (ref 135–145)
TOTAL PROTEIN: 7.5 g/dL (ref 6.5–8.1)

## 2016-08-02 LAB — CBC WITH DIFFERENTIAL/PLATELET
BASOS PCT: 1 %
Basophils Absolute: 0.1 10*3/uL (ref 0–0.1)
Eosinophils Absolute: 0.4 10*3/uL (ref 0–0.7)
Eosinophils Relative: 3 %
HEMATOCRIT: 36.5 % (ref 35.0–47.0)
Hemoglobin: 12.3 g/dL (ref 12.0–16.0)
LYMPHS PCT: 26 %
Lymphs Abs: 3.2 10*3/uL (ref 1.0–3.6)
MCH: 26.9 pg (ref 26.0–34.0)
MCHC: 33.6 g/dL (ref 32.0–36.0)
MCV: 80.1 fL (ref 80.0–100.0)
MONO ABS: 1 10*3/uL — AB (ref 0.2–0.9)
Monocytes Relative: 8 %
NEUTROS ABS: 7.3 10*3/uL — AB (ref 1.4–6.5)
Neutrophils Relative %: 62 %
Platelets: 301 10*3/uL (ref 150–440)
RBC: 4.56 MIL/uL (ref 3.80–5.20)
RDW: 14.6 % — AB (ref 11.5–14.5)
WBC: 12 10*3/uL — AB (ref 3.6–11.0)

## 2016-08-02 NOTE — Progress Notes (Signed)
[place] NOTE  Patient Care Team: Allyne Gee, MD as PCP - General (Internal Medicine) Seeplaputhur Robinette Haines, MD (General Surgery) [name], NP as Nurse Practitioner (Family Medicine) Lavera Guise, MD (Internal Medicine)  CHIEF COMPLAINTS/PURPOSE OF CONSULTATION: Malignancy of the thigh.   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 PET- Avid thigh mass/? Tumor thrombus; Bil avid lung nodules; Left pelvic LN  # Hx of lung nodules [Dr.Khan]   # A.fib on coumadin; ? LLE DVT [? 6-7 years ago]; Smoker [quit >20 y]     Carcinoma of unknown primary ([place])   08/02/2016 Initial Diagnosis    Carcinoma of unknown primary ([place])      HISTORY OF PRESENTING ILLNESS:  Monica [name] 82 y.o.  female noted to have a mass in the left medial thigh approximately 2 months ago it had been progressively growing- which further to imaging [pet as summarized above]; and evaluation with surgery Dr. Jamal Collin did a needle biopsy in office-that showed malignancy. Patient is accompanied by her daughters- to discuss further treatment options. Patient in the interim has met with radiation oncology- plan to start radiation soon.  Patient denies any significant pain. Denies any unusual shortness of breath or chest pain or cough. Denies any bone pain. Denies any lumps or bumps anywhere else. Denies any falls. Denies any weight loss.  Patient had mammogram in July 2015 and a colonoscopy 4 years ago.  6 ROS: A complete 10 point review of system is done which is negative except mentioned above in history of present illness  MEDICAL HISTORY:  Past Medical History:  Diagnosis Date  . Atrial fibrillation ([place])   . Bronchitis   . COPD (chronic obstructive pulmonary disease) ([place])   . Diabetes mellitus without complication ([place])   . Hyperlipidemia   . Hypertension   . Pneumonia   . Stroke ([place])   .  Vitamin B12 deficiency     SURGICAL HISTORY: Past Surgical History:  Procedure Laterality Date  . ABDOMINAL HYSTERECTOMY  1975  . BLADDER SUSPENSION  1989  . 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 She walks with cane and a walker. Social History   Social History  . Marital status: Divorced    Spouse name: N/A  . Number of children: N/A  . Years of education: N/A   Occupational History  . Not on file.   Social History Main Topics  . Smoking status: Former Smoker    Years: 15.00    Quit date: 11/15/1988  . Smokeless tobacco: Never Used  . Alcohol use No  . Drug use: No  . Sexual activity: Not on file   Other Topics Concern  . Not on file   Social History Narrative  . No narrative on file    FAMILY HISTORY: brother- colon-70s.. Sister- kidney cancer 49s.  No family history on file.  ALLERGIES:  is allergic to celecoxib; acetaminophen; codeine; penicillin g; and petrolatum-zinc oxide.  MEDICATIONS:  Current Outpatient Prescriptions  Medication Sig Dispense Refill  . albuterol (PROVENTIL HFA;VENTOLIN HFA) 108 (90 Base) MCG/ACT inhaler Inhale 1 puff into the lungs every 4 (four) hours as needed for wheezing or shortness of breath.    Marland Kitchen amLODipine-benazepril (LOTREL) 5-20 MG capsule TAKE 1 CAPSULE BY MOUTH EVERY DAY 90 capsule  0  . b complex vitamins capsule Take 1 capsule by mouth daily.    . Fluticasone Furoate-Vilanterol 100-25 MCG/INH AEPB Inhale into the lungs.    . gabapentin (NEURONTIN) 300 MG capsule TAKE 2 CAPSULES BY MOUTH AT BEDTIME 60 capsule 5  . glipiZIDE (GLIPIZIDE XL) 2.5 MG 24 hr tablet Take 1 tablet (2.5 mg total) by mouth daily. PATIENT NEEDS TO SCHEDULE OFFICE VISIT FOR FOLLOW UP 90 tablet 0  . hydrochlorothiazide (HYDRODIURIL) 25 MG tablet Take 25 mg by mouth daily.    . metFORMIN (GLUCOPHAGE) 500 MG tablet TAKE ONE TABLET BY MOUTH TWICE DAILY 180 tablet 0  .  montelukast (SINGULAIR) 10 MG tablet Take 10 mg by mouth at bedtime.    Marland Kitchen oxybutynin (DITROPAN-XL) 10 MG 24 hr tablet TAKE ONE TABLET EVERY DAY 90 tablet 3  . pravastatin (PRAVACHOL) 40 MG tablet Take 40 mg by mouth daily.    . theophylline (THEO-24) 100 MG 24 hr capsule Take 100 mg by mouth daily.    Marland Kitchen tiotropium (SPIRIVA) 18 MCG inhalation capsule Place 18 mcg into inhaler and inhale daily.    Marland Kitchen Umeclidinium-Vilanterol (ANORO ELLIPTA) 62.5-25 MCG/INH AEPB     . warfarin (COUMADIN) 2 MG tablet Take 2 mg by mouth as directed. mondays    . warfarin (COUMADIN) 4 MG tablet TAKE ONE TABLET EVERY DAY 60 tablet 3   No current facility-administered medications for this visit.       Marland Kitchen  PHYSICAL EXAMINATION: ECOG PERFORMANCE STATUS: 1 - Symptomatic but completely ambulatory  Vitals:   08/02/16 1528  BP: (!) 143/70  Pulse: 75  Temp: 98.1 F (36.7 C)   There were no vitals filed for this visit.  GENERAL: Well-nourished well-developed; Alert, no distress and comfortable.   In a wheelchair. Accompanied by 3 daughters. EYES: no pallor or icterus OROPHARYNX: no thrush or ulceration; good dentition  NECK: supple, no masses felt LYMPH:  no palpable lymphadenopathy in the cervical, axillary or inguinal regions LUNGS: clear to auscultation and  No wheeze or crackles HEART/CVS: regular rate & rhythm and no murmurs; No lower extremity edema; left medial thigh 6-7 cm mass noted ABDOMEN: abdomen soft, non-tender and normal bowel sounds Musculoskeletal:no cyanosis of digits and no clubbing  PSYCH: alert & oriented x 3 with fluent speech NEURO: no focal motor/sensory deficits SKIN:  no rashes or significant lesions  LABORATORY DATA:  I have reviewed the data as listed Lab Results  Component Value Date   WBC 12.0 (H) 08/02/2016   HGB 12.3 08/02/2016   HCT 36.5 08/02/2016   MCV 80.1 08/02/2016   PLT 301 08/02/2016    Recent Labs  08/02/16 1642  NA 128*  K 3.9  CL 94*  CO2 24  GLUCOSE  81  BUN 12  CREATININE 0.80  CALCIUM 9.2  GFRNONAA >60  GFRAA >60  PROT 7.5  ALBUMIN 4.3  AST 26  ALT 18  ALKPHOS 86  BILITOT 0.3    RADIOGRAPHIC STUDIES: I have personally reviewed the radiological images as listed and agreed with the findings in the report. Nm Pet Image Initial (pi) Whole Body  Result Date: 07/26/2016 CLINICAL DATA:  Initial treatment strategy for high grade sarcoma vs metastatic carcinoma of the left thigh. EXAM: NUCLEAR MEDICINE PET WHOLE BODY TECHNIQUE: 13.59 mCi F-18 FDG was injected intravenously. Full-ring PET imaging was performed from the vertex to the feet after the radiotracer. CT data was obtained and used for attenuation correction and anatomic localization. FASTING BLOOD GLUCOSE:  Value:  137 mg/dl COMPARISON:  None. FINDINGS: Head/Neck: No hypermetabolic lymph nodes in the neck. Chest: No hypermetabolic mediastinal or hilar nodes. Central right upper lobe pulmonary nodule measures 9 mm and has an SUV max equal to 5.3. Nodule within the medial left upper lobe measures 5 mm and has an SUV max equal to 2.04. Index right apical nodule measures 6 mm and has an SUV max equal to 1.6. Abdomen/Pelvis: No abnormal hypermetabolic activity within the liver, pancreas, adrenal glands, or spleen. No hypermetabolic nodes identified within the abdomen. Hypermetabolic left external iliac node measures 1.4 cm and has an SUV max equal to 7.27. Slightly more proximal there is a hypermetabolic left external iliac node which measures 7 mm and has an SUV max equal to 4.06. Skeleton: No focal hypermetabolic activity to suggest skeletal metastasis. Extremities: The mass within the medial left 5 measures 5.9 x 6.9 by 8.9 cm. SUV max equals 25.4, image 297 of series 3. Slightly more proximal within the ventral left upper thigh are several hypermetabolic soft tissue attenuating nodules situated between muscle layers. Index soft tissue nodule measures 1.9 cm and has an SUV max equal to 11.5,  image 272 of series 3. There appears to be intravascular extension of the tumor into the left femoral vein, image 13 within SUV max equal to 13.48. This extends proximally to the level of the left common femoral vein. IMPRESSION: 1. The left upper medial thigh mass is intensely hypermetabolic compatible with tumor. 2. There is evidence of hypermetabolic thrombus extending into the left common femoral vein. This appears to communicate with the left mass and is favored to represent tumor thrombus. Inflamed bland thrombus could conceivably also exhibit malignant range uptake. However, the degree of increased uptake is fairly intense and favors tumor. Consider further investigation with lower extremity Doppler evaluation. 3. Hypermetabolic left external iliac lymph nodes compatible with metastatic at 4. Multifocal hypermetabolic pulmonary nodules within the right lung compatible with pulmonary metastasis. These results will be called to the ordering clinician or representative by the Radiologist Assistant, and communication documented in the PACS or zVision Dashboard. Electronically Signed   By: Kerby Moors M.D.   On: 07/26/2016 10:54    ASSESSMENT & PLAN:   Carcinoma of unknown primary ([place]) Left thigh mass- along with left pelvic adenopathy; and bilateral lung nodules- biopsy high-grade sarcoma [more likely] versus carcinoma [less likely]. We'll discuss the pathology if any further workup would help delineate the pathology-as this would definitely be helpful in treatment options.  # For now I agree with palliative radiation of the left thigh mass. Patient is interested in aggressive treatment options including chemotherapy.   # If the above pathology is truly, sarcoma- I do not think patient is a candidate for for any aggressive chemotherapy that is used to treat sarcomas. Check MMR- if unstable-we will be a candidate for immunotherapy. We will also discuss at the tumor conference. Discussed with  Dr.Sankar  # A/fib/ LLE DVT- coumadin 6-7 years.   # Prior history of lung nodules- followed by pulmonary Dr.Khan.   # Will call the patient and daughter with updates, in the interim if any Ph:  [phone]/ home; [place] Handy/ daughter.   # Patient follow-up with me tentatively in approximately 2 weeks. We will also check tumor marker CBC CMP and LDH.  Thank you Dr.Khan for allowing me to participate in the care of your pleasant patient. Please do not hesitate to contact me with questions or concerns in the  interim.  All questions were answered. The patient knows to call the clinic with any problems, questions or concerns.    Cammie Sickle, MD 08/04/2016 8:22 AM

## 2016-08-02 NOTE — Consult Note (Signed)
NEW PATIENT EVALUATION  Name: Monica [name]  MRN: [id number]  Date:   08/02/2016     DOB: 1926-08-13   This 82 y.o. female patient presents to the clinic for initial evaluation of metastatic cancer to her left thigh.  REFERRING PHYSICIAN: Allyne Gee, MD  CHIEF COMPLAINT:  Chief Complaint  Patient presents with  . 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   PREVIOUS INVESTIGATIONS:  Pathology reports reviewed PET CT scan reviewed Clinical notes reviewed  HPI: Patient is a pleasant 82 year old female who had a fall then noticed a mass in her left inner thigh. She has been seen by surgeon who did a core biopsy on this mass which was positive for poorly differentiated malignancy with morphologic differentiation including high-grade sarcoma any metastatic carcinoma. She had patchy cytokeratin positivity. Patient underwent a PET CT scan unfortunately showing hypermetabolic activity intensely positive in the known left upper medial thigh mass. There is also hypermetabolic thrombus extending to left common femoral vein as well as hypermetabolic left external iliac lymph nodes pulmonary nodules. Patient is fairly asymptomatic she specifically denies marked shortness of breath cough hemoptysis or chest tightness. Major problem is with rubbing of this large mass in her left thigh. I've asked to evaluate her for possible palliative radiation therapy.  PLANNED TREATMENT REGIMEN: Palliative radiation therapy to left thigh  PAST MEDICAL HISTORY:  has a past medical history of Atrial fibrillation ([place]); Bronchitis; COPD (chronic obstructive pulmonary disease) ([place]); Diabetes mellitus without complication ([place]); Hyperlipidemia; Hypertension; Pneumonia; Stroke ([place]); and Vitamin B12 deficiency.    PAST SURGICAL HISTORY:  Past Surgical History:  Procedure Laterality Date  . ABDOMINAL HYSTERECTOMY  1975  . BLADDER SUSPENSION  1989  .  COLONOSCOPY      FAMILY HISTORY: family history is not on file.  SOCIAL HISTORY:  reports that she quit smoking about 27 years ago. She quit after 15.00 years of use. She has never used smokeless tobacco. She reports that she does not drink alcohol or use drugs.  ALLERGIES: Celecoxib; Acetaminophen; Codeine; Penicillin g; and Petrolatum-zinc oxide  MEDICATIONS:  Current Outpatient Prescriptions  Medication Sig Dispense Refill  . albuterol (PROVENTIL HFA;VENTOLIN HFA) 108 (90 Base) MCG/ACT inhaler Inhale 1 puff into the lungs every 4 (four) hours as needed for wheezing or shortness of breath.    Marland Kitchen amLODipine-benazepril (LOTREL) 5-20 MG capsule TAKE 1 CAPSULE BY MOUTH EVERY DAY 90 capsule 0  . b complex vitamins capsule Take 1 capsule by mouth daily.    . Fluticasone Furoate-Vilanterol 100-25 MCG/INH AEPB Inhale into the lungs.    . gabapentin (NEURONTIN) 300 MG capsule TAKE 2 CAPSULES BY MOUTH AT BEDTIME 60 capsule 5  . glipiZIDE (GLIPIZIDE XL) 2.5 MG 24 hr tablet Take 1 tablet (2.5 mg total) by mouth daily. PATIENT NEEDS TO SCHEDULE OFFICE VISIT FOR FOLLOW UP 90 tablet 0  . hydrochlorothiazide (HYDRODIURIL) 25 MG tablet Take 25 mg by mouth daily.    . metFORMIN (GLUCOPHAGE) 500 MG tablet TAKE ONE TABLET BY MOUTH TWICE DAILY 180 tablet 0  . montelukast (SINGULAIR) 10 MG tablet Take 10 mg by mouth at bedtime.    Marland Kitchen oxybutynin (DITROPAN-XL) 10 MG 24 hr tablet TAKE ONE TABLET EVERY DAY 90 tablet 3  . pravastatin (PRAVACHOL) 40 MG tablet Take 40 mg by mouth daily.    . theophylline (THEO-24) 100 MG 24 hr capsule Take 100 mg by mouth daily.    Marland Kitchen  tiotropium (SPIRIVA) 18 MCG inhalation capsule Place 18 mcg into inhaler and inhale daily.    Marland Kitchen Umeclidinium-Vilanterol (ANORO ELLIPTA) 62.5-25 MCG/INH AEPB     . warfarin (COUMADIN) 2 MG tablet Take 2 mg by mouth as directed. mondays    . warfarin (COUMADIN) 4 MG tablet TAKE ONE TABLET EVERY DAY 60 tablet 3   No current facility-administered  medications for this encounter.     ECOG PERFORMANCE STATUS:  1 - Symptomatic but completely ambulatory  REVIEW OF SYSTEMS:  Patient denies any weight loss, fatigue, weakness, fever, chills or night sweats. Patient denies any loss of vision, blurred vision. Patient denies any ringing  of the ears or hearing loss. No irregular heartbeat. Patient denies heart murmur or history of fainting. Patient denies any chest pain or pain radiating to her upper extremities. Patient denies any shortness of breath, difficulty breathing at night, cough or hemoptysis. Patient denies any swelling in the lower legs. Patient denies any nausea vomiting, vomiting of blood, or coffee ground material in the vomitus. Patient denies any stomach pain. Patient states has had normal bowel movements no significant constipation or diarrhea. Patient denies any dysuria, hematuria or significant nocturia. Patient denies any problems walking, swelling in the joints or loss of balance. Patient denies any skin changes, loss of hair or loss of weight. Patient denies any excessive worrying or anxiety or significant depression. Patient denies any problems with insomnia. Patient denies excessive thirst, polyuria, polydipsia. Patient denies any swollen glands, patient denies easy bruising or easy bleeding. Patient denies any recent infections, allergies or URI. Patient "s visual fields have not changed significantly in recent time.    PHYSICAL EXAM: BP (!) 144/74   Pulse 83   Temp 97.3 F (36.3 C)   Wt 160 lb 4.4 oz (72.7 kg)   BMI 28.39 kg/m  Large confluent mass in the left inner thigh. Mass is mobile no evidence of skin ulceration is noted. Well-developed well-nourished patient in NAD. HEENT reveals PERLA, EOMI, discs not visualized.  Oral cavity is clear. No oral mucosal lesions are identified. Neck is clear without evidence of cervical or supraclavicular adenopathy. Lungs are clear to A&P. Cardiac examination is essentially  unremarkable with regular rate and rhythm without murmur rub or thrill. Abdomen is benign with no organomegaly or masses noted. Motor sensory and DTR levels are equal and symmetric in the upper and lower extremities. Cranial nerves II through XII are grossly intact. Proprioception is intact. No peripheral adenopathy or edema is identified. No motor or sensory levels are noted. Crude visual fields are within normal range.  LABORATORY DATA: Pathology reports reviewed    RADIOLOGY RESULTS: PET CT scan reviewed   IMPRESSION: Metastatic tumor of unknown primary with large bulky mass in left inner thigh for palliative treatment  PLAN: At this time I to go ahead with the palliative course of radiation therapy to her left thigh. Would plan on delivering 3000 cGy in 10 fractions. Risks and benefits of treatment including skin reaction fatigue were discussed in detail with the patient and her family. Patient is also seeing medical oncology today for possibility systemic treatment although based on her advanced age do not know whether with her fairly asymptomatic history chemotherapy will be recommended. I first set up and ordered CT simulation for later this week.   I would like to take this opportunity to thank you for allowing me to participate in the care of your patient.Armstead Peaks., MD

## 2016-08-02 NOTE — Progress Notes (Signed)
Patient brought to exam room 15 via wheelchair, accompanied by family.  Patient states her upper inner left thigh is painful when she keeps her legs together.

## 2016-08-03 ENCOUNTER — Other Ambulatory Visit: Payer: Self-pay | Admitting: Family Medicine

## 2016-08-03 LAB — CANCER ANTIGEN 19-9: CA 19-9: 73 U/mL — ABNORMAL HIGH (ref 0–35)

## 2016-08-03 LAB — CANCER ANTIGEN 15-3: CA 15-3: 15.6 U/mL (ref 0.0–25.0)

## 2016-08-03 LAB — CEA: CEA: 3.2 ng/mL (ref 0.0–4.7)

## 2016-08-03 LAB — CA 125: CA 125: 16.3 U/mL (ref 0.0–38.1)

## 2016-08-03 LAB — CANCER ANTIGEN 27.29: CA 27.29: 12.1 U/mL (ref 0.0–38.6)

## 2016-08-04 ENCOUNTER — Ambulatory Visit
Admission: RE | Admit: 2016-08-04 | Discharge: 2016-08-04 | Disposition: A | Discharge status: Home / Self Care | Payer: Medicare Other | Source: Ambulatory Visit | Attending: Radiation Oncology | Admitting: Radiation Oncology

## 2016-08-04 DIAGNOSIS — C4922 Malignant neoplasm of connective and soft tissue of left lower limb, including hip: Secondary | ICD-10-CM | POA: Diagnosis not present

## 2016-08-04 DIAGNOSIS — C7989 Secondary malignant neoplasm of other specified sites: Secondary | ICD-10-CM | POA: Diagnosis not present

## 2016-08-04 DIAGNOSIS — R918 Other nonspecific abnormal finding of lung field: Secondary | ICD-10-CM | POA: Diagnosis not present

## 2016-08-04 DIAGNOSIS — J449 Chronic obstructive pulmonary disease, unspecified: Secondary | ICD-10-CM | POA: Diagnosis not present

## 2016-08-04 DIAGNOSIS — Z510 Encounter for antineoplastic radiation therapy: Secondary | ICD-10-CM | POA: Diagnosis not present

## 2016-08-04 DIAGNOSIS — C801 Malignant (primary) neoplasm, unspecified: Secondary | ICD-10-CM | POA: Diagnosis not present

## 2016-08-04 DIAGNOSIS — I4891 Unspecified atrial fibrillation: Secondary | ICD-10-CM | POA: Diagnosis not present

## 2016-08-04 NOTE — Assessment & Plan Note (Signed)
Left thigh mass- along with left pelvic adenopathy; and bilateral lung nodules- biopsy high-grade sarcoma [more likely] versus carcinoma [less likely]. We'll discuss the pathology if any further workup would help delineate the pathology-as this would definitely be helpful in treatment options.  # For now I agree with palliative radiation of the left thigh mass. Patient is interested in aggressive treatment options including chemotherapy.   # If the above pathology is truly, sarcoma- I do not think patient is a candidate for for any aggressive chemotherapy that is used to treat sarcomas. Check MMR- if unstable-we will be a candidate for immunotherapy. We will also discuss at the tumor conference. Discussed with Dr.Sankar  # A/fib/ LLE DVT- coumadin 6-7 years.   # Prior history of lung nodules- followed by pulmonary Dr.Khan.   # Will call the patient and daughter with updates, in the interim if any Ph:  [phone]/ home; Rawlings Handy/ daughter.   # Patient follow-up with me tentatively in approximately 2 weeks. We will also check tumor marker CBC CMP and LDH.  Thank you Dr.Khan for allowing me to participate in the care of your pleasant patient. Please do not hesitate to contact me with questions or concerns in the interim.

## 2016-08-05 ENCOUNTER — Encounter: Payer: Self-pay | Admitting: General Surgery

## 2016-08-05 DIAGNOSIS — C4922 Malignant neoplasm of connective and soft tissue of left lower limb, including hip: Secondary | ICD-10-CM | POA: Diagnosis not present

## 2016-08-05 DIAGNOSIS — I482 Chronic atrial fibrillation: Secondary | ICD-10-CM | POA: Diagnosis not present

## 2016-08-06 ENCOUNTER — Other Ambulatory Visit: Payer: Self-pay | Admitting: *Deleted

## 2016-08-06 DIAGNOSIS — C4922 Malignant neoplasm of connective and soft tissue of left lower limb, including hip: Secondary | ICD-10-CM

## 2016-08-08 ENCOUNTER — Other Ambulatory Visit: Payer: Self-pay

## 2016-08-08 ENCOUNTER — Encounter: Payer: Self-pay | Admitting: Emergency Medicine

## 2016-08-08 ENCOUNTER — Inpatient Hospital Stay
Admission: EM | Admit: 2016-08-08 | Discharge: 2016-08-11 | DRG: 378 | Disposition: A | Discharge status: Home / Self Care | Payer: Medicare Other | Attending: Internal Medicine | Admitting: Internal Medicine

## 2016-08-08 DIAGNOSIS — K625 Hemorrhage of anus and rectum: Secondary | ICD-10-CM

## 2016-08-08 DIAGNOSIS — I4891 Unspecified atrial fibrillation: Secondary | ICD-10-CM | POA: Diagnosis not present

## 2016-08-08 DIAGNOSIS — C801 Malignant (primary) neoplasm, unspecified: Secondary | ICD-10-CM | POA: Diagnosis not present

## 2016-08-08 DIAGNOSIS — R791 Abnormal coagulation profile: Secondary | ICD-10-CM

## 2016-08-08 DIAGNOSIS — I10 Essential (primary) hypertension: Secondary | ICD-10-CM | POA: Diagnosis present

## 2016-08-08 DIAGNOSIS — Z888 Allergy status to other drugs, medicaments and biological substances status: Secondary | ICD-10-CM

## 2016-08-08 DIAGNOSIS — Z880 Allergy status to penicillin: Secondary | ICD-10-CM

## 2016-08-08 DIAGNOSIS — Z87891 Personal history of nicotine dependence: Secondary | ICD-10-CM | POA: Diagnosis not present

## 2016-08-08 DIAGNOSIS — E119 Type 2 diabetes mellitus without complications: Secondary | ICD-10-CM | POA: Diagnosis present

## 2016-08-08 DIAGNOSIS — D62 Acute posthemorrhagic anemia: Secondary | ICD-10-CM | POA: Diagnosis not present

## 2016-08-08 DIAGNOSIS — N390 Urinary tract infection, site not specified: Secondary | ICD-10-CM | POA: Diagnosis present

## 2016-08-08 DIAGNOSIS — J449 Chronic obstructive pulmonary disease, unspecified: Secondary | ICD-10-CM | POA: Diagnosis present

## 2016-08-08 DIAGNOSIS — Z86718 Personal history of other venous thrombosis and embolism: Secondary | ICD-10-CM

## 2016-08-08 DIAGNOSIS — D689 Coagulation defect, unspecified: Secondary | ICD-10-CM | POA: Diagnosis not present

## 2016-08-08 DIAGNOSIS — E538 Deficiency of other specified B group vitamins: Secondary | ICD-10-CM | POA: Diagnosis not present

## 2016-08-08 DIAGNOSIS — K922 Gastrointestinal hemorrhage, unspecified: Secondary | ICD-10-CM | POA: Diagnosis not present

## 2016-08-08 DIAGNOSIS — G8929 Other chronic pain: Secondary | ICD-10-CM | POA: Diagnosis present

## 2016-08-08 DIAGNOSIS — Z8673 Personal history of transient ischemic attack (TIA), and cerebral infarction without residual deficits: Secondary | ICD-10-CM | POA: Diagnosis not present

## 2016-08-08 DIAGNOSIS — Z90710 Acquired absence of both cervix and uterus: Secondary | ICD-10-CM

## 2016-08-08 DIAGNOSIS — Z9889 Other specified postprocedural states: Secondary | ICD-10-CM

## 2016-08-08 DIAGNOSIS — D500 Iron deficiency anemia secondary to blood loss (chronic): Secondary | ICD-10-CM | POA: Diagnosis not present

## 2016-08-08 DIAGNOSIS — Z23 Encounter for immunization: Secondary | ICD-10-CM | POA: Diagnosis not present

## 2016-08-08 DIAGNOSIS — K921 Melena: Principal | ICD-10-CM | POA: Diagnosis present

## 2016-08-08 DIAGNOSIS — E871 Hypo-osmolality and hyponatremia: Secondary | ICD-10-CM | POA: Diagnosis present

## 2016-08-08 DIAGNOSIS — Z7984 Long term (current) use of oral hypoglycemic drugs: Secondary | ICD-10-CM | POA: Diagnosis not present

## 2016-08-08 DIAGNOSIS — E785 Hyperlipidemia, unspecified: Secondary | ICD-10-CM | POA: Diagnosis not present

## 2016-08-08 DIAGNOSIS — C4922 Malignant neoplasm of connective and soft tissue of left lower limb, including hip: Secondary | ICD-10-CM | POA: Diagnosis not present

## 2016-08-08 DIAGNOSIS — Z79899 Other long term (current) drug therapy: Secondary | ICD-10-CM | POA: Diagnosis not present

## 2016-08-08 DIAGNOSIS — Z510 Encounter for antineoplastic radiation therapy: Secondary | ICD-10-CM | POA: Diagnosis not present

## 2016-08-08 DIAGNOSIS — B9620 Unspecified Escherichia coli [E. coli] as the cause of diseases classified elsewhere: Secondary | ICD-10-CM | POA: Diagnosis present

## 2016-08-08 DIAGNOSIS — D6859 Other primary thrombophilia: Secondary | ICD-10-CM | POA: Diagnosis present

## 2016-08-08 DIAGNOSIS — R109 Unspecified abdominal pain: Secondary | ICD-10-CM | POA: Diagnosis present

## 2016-08-08 DIAGNOSIS — Z7901 Long term (current) use of anticoagulants: Secondary | ICD-10-CM | POA: Diagnosis not present

## 2016-08-08 DIAGNOSIS — Z8701 Personal history of pneumonia (recurrent): Secondary | ICD-10-CM | POA: Diagnosis not present

## 2016-08-08 DIAGNOSIS — R918 Other nonspecific abnormal finding of lung field: Secondary | ICD-10-CM | POA: Diagnosis not present

## 2016-08-08 DIAGNOSIS — C7989 Secondary malignant neoplasm of other specified sites: Secondary | ICD-10-CM | POA: Diagnosis not present

## 2016-08-08 HISTORY — DX: Malignant (primary) neoplasm, unspecified: C80.1

## 2016-08-08 HISTORY — DX: Acute pancreatitis without necrosis or infection, unspecified: K85.90

## 2016-08-08 LAB — PROTIME-INR
INR: 4
INR: 1.1
INR: 1.06
INR: 1.09
PROTHROMBIN TIME: 14.1 s (ref 11.4–15.2)
Prothrombin Time: 40.1 seconds — ABNORMAL HIGH (ref 11.4–15.2)
Prothrombin Time: 14.2 seconds (ref 11.4–15.2)
Prothrombin Time: 13.8 seconds (ref 11.4–15.2)

## 2016-08-08 LAB — HEMOGLOBIN AND HEMATOCRIT, BLOOD
HCT: 24 % — ABNORMAL LOW (ref 35.0–47.0)
HEMOGLOBIN: 8.3 g/dL — AB (ref 12.0–16.0)

## 2016-08-08 LAB — MRSA PCR SCREENING: MRSA by PCR: NEGATIVE

## 2016-08-08 LAB — PREPARE RBC (CROSSMATCH)

## 2016-08-08 LAB — CBC
HEMATOCRIT: 26.7 % — AB (ref 35.0–47.0)
Hemoglobin: 8.9 g/dL — ABNORMAL LOW (ref 12.0–16.0)
MCH: 26.6 pg (ref 26.0–34.0)
MCHC: 33.5 g/dL (ref 32.0–36.0)
MCV: 79.6 fL — ABNORMAL LOW (ref 80.0–100.0)
PLATELETS: 274 10*3/uL (ref 150–440)
RBC: 3.36 MIL/uL — ABNORMAL LOW (ref 3.80–5.20)
RDW: 14.7 % — AB (ref 11.5–14.5)
WBC: 9.2 10*3/uL (ref 3.6–11.0)

## 2016-08-08 LAB — URINALYSIS COMPLETE WITH MICROSCOPIC (ARMC ONLY)
BILIRUBIN URINE: NEGATIVE
Glucose, UA: NEGATIVE mg/dL
KETONES UR: NEGATIVE mg/dL
NITRITE: POSITIVE — AB
PROTEIN: NEGATIVE mg/dL
RBC / HPF: NONE SEEN RBC/hpf (ref 0–5)
SPECIFIC GRAVITY, URINE: 1.006 (ref 1.005–1.030)
pH: 7 (ref 5.0–8.0)

## 2016-08-08 LAB — COMPREHENSIVE METABOLIC PANEL
ALT: 14 U/L (ref 14–54)
AST: 22 U/L (ref 15–41)
Albumin: 3.3 g/dL — ABNORMAL LOW (ref 3.5–5.0)
Alkaline Phosphatase: 66 U/L (ref 38–126)
Anion gap: 7 (ref 5–15)
BUN: 18 mg/dL (ref 6–20)
CO2: 26 mmol/L (ref 22–32)
Calcium: 8.7 mg/dL — ABNORMAL LOW (ref 8.9–10.3)
Chloride: 96 mmol/L — ABNORMAL LOW (ref 101–111)
Creatinine, Ser: 0.88 mg/dL (ref 0.44–1.00)
GFR calc Af Amer: >60 mL/min (ref >60)
GFR calc non Af Amer: 57 mL/min — ABNORMAL LOW (ref >60)
Glucose, Bld: 166 mg/dL — ABNORMAL HIGH (ref 65–99)
Potassium: 4.5 mmol/L (ref 3.5–5.1)
Sodium: 129 mmol/L — ABNORMAL LOW (ref 135–145)
Total Bilirubin: 0.1 mg/dL — ABNORMAL LOW (ref 0.3–1.2)
Total Protein: 6.1 g/dL — ABNORMAL LOW (ref 6.5–8.1)

## 2016-08-08 LAB — HEMOGLOBIN: Hemoglobin: 9.1 g/dL — ABNORMAL LOW (ref 12.0–16.0)

## 2016-08-08 LAB — APTT: APTT: 47 s — AB (ref 24–36)

## 2016-08-08 LAB — ABO/RH: ABO/RH(D): B POS

## 2016-08-08 LAB — GLUCOSE, CAPILLARY
GLUCOSE-CAPILLARY: 150 mg/dL — AB (ref 65–99)
GLUCOSE-CAPILLARY: 98 mg/dL (ref 65–99)
Glucose-Capillary: 178 mg/dL — ABNORMAL HIGH (ref 65–99)

## 2016-08-08 LAB — TROPONIN I: Troponin I: <0.03 ng/mL (ref <0.03)

## 2016-08-08 MED ORDER — BENAZEPRIL HCL 20 MG PO TABS
20.0000 mg | ORAL_TABLET | Freq: Every day | ORAL | Status: DC
  Administered 2016-08-08 – 2016-08-11 (×4): 20 mg via ORAL
  Filled 2016-08-08 (×4): qty 1

## 2016-08-08 MED ORDER — AMLODIPINE BESY-BENAZEPRIL HCL 5-20 MG PO CAPS: 1.0000 | ORAL_CAPSULE | Freq: Every day | ORAL | Status: DC

## 2016-08-08 MED ORDER — INSULIN ASPART 100 UNIT/ML ~~LOC~~ SOLN: 0.0000 [IU] | Freq: Every day | SUBCUTANEOUS | Status: DC

## 2016-08-08 MED ORDER — AMLODIPINE BESYLATE 5 MG PO TABS
5.0000 mg | ORAL_TABLET | Freq: Every day | ORAL | Status: DC
  Administered 2016-08-08 – 2016-08-11 (×4): 5 mg via ORAL
  Filled 2016-08-08 (×4): qty 1

## 2016-08-08 MED ORDER — FAMOTIDINE IN NACL 20-0.9 MG/50ML-% IV SOLN
20.0000 mg | Freq: Two times a day (BID) | INTRAVENOUS | Status: DC
  Filled 2016-08-08: qty 50

## 2016-08-08 MED ORDER — VITAMIN K1 10 MG/ML IJ SOLN
10.0000 mg | INTRAVENOUS | Status: AC
  Administered 2016-08-08: 10 mg via INTRAVENOUS
  Filled 2016-08-08: qty 1

## 2016-08-08 MED ORDER — SODIUM CHLORIDE 0.9 % IV BOLUS (SEPSIS)
1000.0000 mL | Freq: Once | INTRAVENOUS | Status: AC
  Administered 2016-08-08: 1000 mL via INTRAVENOUS

## 2016-08-08 MED ORDER — OXYBUTYNIN CHLORIDE ER 5 MG PO TB24
10.0000 mg | ORAL_TABLET | Freq: Every day | ORAL | Status: DC
  Administered 2016-08-08 – 2016-08-11 (×4): 10 mg via ORAL
  Filled 2016-08-08: qty 1
  Filled 2016-08-08 (×2): qty 2
  Filled 2016-08-08: qty 1

## 2016-08-08 MED ORDER — PROTHROMBIN COMPLEX CONC HUMAN 500 UNITS IV KIT
2099.0000 [IU] | PACK | Status: AC
  Administered 2016-08-08: 2099 [IU] via INTRAVENOUS
  Filled 2016-08-08: qty 84

## 2016-08-08 MED ORDER — GLIPIZIDE ER 2.5 MG PO TB24
2.5000 mg | ORAL_TABLET | Freq: Every day | ORAL | Status: DC
  Administered 2016-08-08 – 2016-08-11 (×4): 2.5 mg via ORAL
  Filled 2016-08-08 (×4): qty 1

## 2016-08-08 MED ORDER — B COMPLEX VITAMINS PO CAPS: 1.0000 | ORAL_CAPSULE | Freq: Every day | ORAL | Status: DC

## 2016-08-08 MED ORDER — THEOPHYLLINE ER 100 MG PO CP24
100.0000 mg | ORAL_CAPSULE | Freq: Every day | ORAL | Status: DC
  Administered 2016-08-08 – 2016-08-11 (×4): 100 mg via ORAL
  Filled 2016-08-08 (×4): qty 1

## 2016-08-08 MED ORDER — FAMOTIDINE 20 MG PO TABS
20.0000 mg | ORAL_TABLET | Freq: Two times a day (BID) | ORAL | Status: DC
  Administered 2016-08-08 – 2016-08-11 (×7): 20 mg via ORAL
  Filled 2016-08-08 (×7): qty 1

## 2016-08-08 MED ORDER — CIPROFLOXACIN IN D5W 200 MG/100ML IV SOLN
200.0000 mg | Freq: Two times a day (BID) | INTRAVENOUS | Status: DC
  Administered 2016-08-08 – 2016-08-09 (×3): 200 mg via INTRAVENOUS
  Filled 2016-08-08 (×4): qty 100

## 2016-08-08 MED ORDER — VITAMIN K1 10 MG/ML IJ SOLN: 5.0000 mg | Freq: Once | INTRAVENOUS | Status: DC

## 2016-08-08 MED ORDER — INSULIN ASPART 100 UNIT/ML ~~LOC~~ SOLN
0.0000 [IU] | Freq: Three times a day (TID) | SUBCUTANEOUS | Status: DC
  Administered 2016-08-08: 2 [IU] via SUBCUTANEOUS
  Administered 2016-08-09: 1 [IU] via SUBCUTANEOUS
  Administered 2016-08-09 – 2016-08-11 (×4): 2 [IU] via SUBCUTANEOUS
  Filled 2016-08-08: qty 1
  Filled 2016-08-08 (×5): qty 2

## 2016-08-08 MED ORDER — ALBUTEROL SULFATE (2.5 MG/3ML) 0.083% IN NEBU: 3.0000 mL | INHALATION_SOLUTION | RESPIRATORY_TRACT | Status: DC | PRN

## 2016-08-08 MED ORDER — PRAVASTATIN SODIUM 40 MG PO TABS
40.0000 mg | ORAL_TABLET | Freq: Every day | ORAL | Status: DC
  Administered 2016-08-08 – 2016-08-11 (×4): 40 mg via ORAL
  Filled 2016-08-08 (×4): qty 1

## 2016-08-08 MED ORDER — THEOPHYLLINE ER 100 MG PO CP24: 100.0000 mg | ORAL_CAPSULE | Freq: Every day | ORAL | Status: DC

## 2016-08-08 MED ORDER — SODIUM CHLORIDE 0.9 % IV SOLN
Freq: Once | INTRAVENOUS | Status: AC
  Administered 2016-08-08: 12:00:00 via INTRAVENOUS

## 2016-08-08 MED ORDER — MONTELUKAST SODIUM 10 MG PO TABS
10.0000 mg | ORAL_TABLET | Freq: Every day | ORAL | Status: DC
  Administered 2016-08-08 – 2016-08-10 (×3): 10 mg via ORAL
  Filled 2016-08-08 (×3): qty 1

## 2016-08-08 MED ORDER — SODIUM CHLORIDE 0.9 % IV SOLN
INTRAVENOUS | Status: DC
  Administered 2016-08-08: 16:00:00 via INTRAVENOUS
  Administered 2016-08-09 – 2016-08-10 (×2): 100 mL/h via INTRAVENOUS
  Administered 2016-08-11: 04:00:00 via INTRAVENOUS

## 2016-08-08 MED ORDER — B COMPLEX-C PO TABS
1.0000 | ORAL_TABLET | Freq: Every day | ORAL | Status: DC
  Administered 2016-08-08 – 2016-08-11 (×4): 1 via ORAL
  Filled 2016-08-08 (×4): qty 1

## 2016-08-08 MED ORDER — VITAMIN K1 10 MG/ML IJ SOLN: 10.0000 mg | Freq: Once | INTRAVENOUS | Status: DC

## 2016-08-08 MED ORDER — GABAPENTIN 300 MG PO CAPS
600.0000 mg | ORAL_CAPSULE | Freq: Every day | ORAL | Status: DC
  Administered 2016-08-08 – 2016-08-10 (×3): 600 mg via ORAL
  Filled 2016-08-08 (×3): qty 2

## 2016-08-08 NOTE — ED Notes (Signed)
Pharmacy called and notified of need of vitamin K & kcentra.

## 2016-08-08 NOTE — Progress Notes (Signed)
Patient resting quietly in bed at this time watching Tv. Stool x1 this evening medium in size with blood noted unable to assess thoroughly d/t urine being present. Daughter and daughter in law visiting during the day and evening aware of plan of care. 1 unit blood administered without incident. Medications accepted PO without difficulty. Patient denies pain. Stable.

## 2016-08-08 NOTE — H&P (Addendum)
Monica [name] is an 82 y.o. female.   Chief Complaint: BRBPR HPI: Patient noted BRBPR yesterday with a couple of bowel movements. Had one more today and later felt light headed. In ED was founf to have 3 g dropp from previous Hgb and an INR of 4. Patient refused to be transferred since there is no GI coverage until tomorrow morning.   Past Medical History:  Diagnosis Date  . Atrial fibrillation ([place])   . Bronchitis   . COPD (chronic obstructive pulmonary disease) ([place])   . Diabetes mellitus without complication ([place])   . Hyperlipidemia   . Hypertension   . Pancreatitis   . Pneumonia   . Stroke ([place])   . Vitamin B12 deficiency     Past Surgical History:  Procedure Laterality Date  . ABDOMINAL HYSTERECTOMY  1975  . BLADDER SUSPENSION  1989  . COLONOSCOPY      History reviewed. No pertinent family history. Social History:  reports that she quit smoking about 27 years ago. She quit after 15.00 years of use. She has never used smokeless tobacco. She reports that she does not drink alcohol or use drugs.  Allergies:  Allergies  Allergen Reactions  . Celecoxib Nausea And Vomiting  . Acetaminophen Itching  . Codeine Rash  . Penicillin G Rash  . Petrolatum-Zinc Oxide Rash     (Not in a hospital admission)  Results for orders placed or performed during the hospital encounter of 08/08/16 (from the past 48 hour(s))  Comprehensive metabolic panel     Status: Abnormal   Collection Time: 08/08/16  7:06 AM  Result Value Ref Range   Sodium 129 (L) 135 - 145 mmol/L   Potassium 4.5 3.5 - 5.1 mmol/L   Chloride 96 (L) 101 - 111 mmol/L   CO2 26 22 - 32 mmol/L   Glucose, Bld 166 (H) 65 - 99 mg/dL   BUN 18 6 - 20 mg/dL   Creatinine, Ser 0.88 0.44 - 1.00 mg/dL   Calcium 8.7 (L) 8.9 - 10.3 mg/dL   Total Protein 6.1 (L) 6.5 - 8.1 g/dL   Albumin 3.3 (L) 3.5 - 5.0 g/dL   AST 22 15 - 41 U/L   ALT 14 14 - 54 U/L   Alkaline Phosphatase 66 38 - 126 U/L   Total Bilirubin 0.1 (L) 0.3 - 1.2  mg/dL   GFR calc non Af Amer 57 (L) >60 mL/min   GFR calc Af Amer >60 >60 mL/min    Comment: (NOTE) The eGFR has been calculated using the CKD EPI equation. This calculation has not been validated in all clinical situations. eGFR's persistently <60 mL/min signify possible Chronic Kidney Disease.    Anion gap 7 5 - 15  CBC     Status: Abnormal   Collection Time: 08/08/16  7:06 AM  Result Value Ref Range   WBC 9.2 3.6 - 11.0 K/uL   RBC 3.36 (L) 3.80 - 5.20 MIL/uL   Hemoglobin 8.9 (L) 12.0 - 16.0 g/dL   HCT 26.7 (L) 35.0 - 47.0 %   MCV 79.6 (L) 80.0 - 100.0 fL   MCH 26.6 26.0 - 34.0 pg   MCHC 33.5 32.0 - 36.0 g/dL   RDW 14.7 (H) 11.5 - 14.5 %   Platelets 274 150 - 440 K/uL  Type and screen [place]     Status: None   Collection Time: 08/08/16  7:06 AM  Result Value Ref Range   ABO/RH(D) B POS  Antibody Screen NEG    Sample Expiration 08/11/2016   Troponin I     Status: None   Collection Time: 08/08/16  7:06 AM  Result Value Ref Range   Troponin I <0.03 <0.03 ng/mL  Protime-INR     Status: Abnormal   Collection Time: 08/08/16  7:06 AM  Result Value Ref Range   Prothrombin Time 40.1 (H) 11.4 - 15.2 seconds   INR 4.00   APTT     Status: Abnormal   Collection Time: 08/08/16  7:06 AM  Result Value Ref Range   aPTT 47 (H) 24 - 36 seconds    Comment:        IF BASELINE aPTT IS ELEVATED, SUGGEST PATIENT RISK ASSESSMENT BE USED TO DETERMINE APPROPRIATE ANTICOAGULANT THERAPY.    No results found.  Review of Systems  Constitutional: Negative for chills and fever.  HENT: Negative for hearing loss.   Eyes: Negative for blurred vision.  Respiratory: Negative for cough and shortness of breath.   Cardiovascular: Negative for chest pain.  Gastrointestinal: Positive for blood in stool. Negative for nausea and vomiting.  Genitourinary: Negative for dysuria.  Musculoskeletal: Positive for back pain.  Skin: Negative for rash.  Neurological: Positive for  dizziness.    Blood pressure (!) 165/60, pulse (!) 105, temperature 97.8 F (36.6 C), temperature source Oral, resp. rate 20, height _0  (1.6 m), weight 71.2 kg (157 lb), SpO2 98 %. Physical Exam  Constitutional: She is oriented to person, place, and time. She appears well-developed and well-nourished. No distress.  HENT:  Head: Normocephalic and atraumatic.  Mouth/Throat: Oropharynx is clear and moist. No oropharyngeal exudate.  Eyes: Pupils are equal, round, and reactive to light. Left eye exhibits no discharge. No scleral icterus.  Neck: Neck supple. No JVD present. No tracheal deviation present. No thyromegaly present.  Cardiovascular: Normal rate and regular rhythm.   Murmur heard. Respiratory: Effort normal and breath sounds normal. No respiratory distress.  GI: Soft. Bowel sounds are normal. She exhibits no mass. There is no tenderness.  Musculoskeletal: Normal range of motion. She exhibits no edema or tenderness.  Lymphadenopathy:    She has no cervical adenopathy.  Neurological: She is alert and oriented to person, place, and time. No cranial nerve deficit.  Skin: No rash noted.     Assessment/Plan 1. GI Bleed: Suspect lower GI. Has history of hemrrhoids. Patient is being given FFP and vit K and has been typed and crossed. Discussed with patient about no GI coverage today and that if bleeding worsens then she would have to be transferred. She expressed understanding. Will admit to stepdown unit. Will consult GI in the am.  2. Acute blood loss anemia: Secondary to GI bleeding. Will transfuse 1 unit PRBC and follow frequent Hgb.  3. Coagulopathy: INR 4. On coumadin for history of multiple DVTs. Given vit k and FFP. Will recheck INR later today.  4. Hx DVT: Will have to be off anticoagulation for now.  5. UTI: Start Cipro and get cultures.   Time spent= 60 min  Baxter Hire, MD 08/08/2016, 9:50 AM

## 2016-08-08 NOTE — ED Notes (Signed)
Attempted to call report x 1  

## 2016-08-08 NOTE — ED Provider Notes (Signed)
[name] Provider Note ____________________________________________   I have reviewed the triage vital signs and the triage nursing note.  HISTORY  Chief Complaint Rectal Bleeding   Historian Patient  HPI [name] is a 82 y.o. female with a history of atrial fibrillation, diabetes, history of stroke, and DVT per the patient, presents today with rectal bleeding since yesterday. She's had several episodes where she felt like she needed to have a bowel movement and then passed just bloody clots and bright red blood. She's never had intestinal bleeding before. 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  She's felt weak and nearly passed out. No chest pain or trouble breathing. Symptoms are moderate to severe.  She reports that she checks her INR at home and this past week her level was 3.6.    Past Medical History:  Diagnosis Date  . Atrial fibrillation ([name])   . Bronchitis   . COPD (chronic obstructive pulmonary disease) ([name])   . Diabetes mellitus without complication ([name])   . Hyperlipidemia   . Hypertension   . Pancreatitis   . Pneumonia   . Stroke ([name])   . Vitamin B12 deficiency     Patient Active Problem List   Diagnosis Date Noted  . Carcinoma of unknown primary ([name]) 08/02/2016  . Diabetes ([name]) 05/08/2015    Past Surgical History:  Procedure Laterality Date  . ABDOMINAL HYSTERECTOMY  1975  . BLADDER SUSPENSION  1989  . COLONOSCOPY      Prior to Admission medications   Medication Sig Start Date End Date Taking? Authorizing Provider  albuterol (PROVENTIL HFA;VENTOLIN HFA) 108 (90 Base) MCG/ACT inhaler Inhale 1 puff into the lungs every 4 (four) hours as needed for wheezing or shortness of breath.   Yes Historical Provider, MD  amLODipine-benazepril (LOTREL) 5-20 MG capsule TAKE 1 CAPSULE BY MOUTH EVERY  DAY 04/07/16  Yes Monica Sons, MD  b complex vitamins capsule Take 1 capsule by mouth daily.   Yes Historical Provider, MD  Fluticasone Furoate-Vilanterol 100-25 MCG/INH AEPB Inhale into the lungs.   Yes Historical Provider, MD  gabapentin (NEURONTIN) 300 MG capsule TAKE 2 CAPSULES BY MOUTH AT BEDTIME 12/31/15  Yes Monica Sons, MD  glipiZIDE (GLIPIZIDE XL) 2.5 MG 24 hr tablet Take 1 tablet (2.5 mg total) by mouth daily. PATIENT NEEDS TO SCHEDULE OFFICE VISIT FOR FOLLOW UP 06/04/16  Yes Monica Sons, MD  hydrochlorothiazide (HYDRODIURIL) 25 MG tablet Take 25 mg by mouth daily.   Yes Historical Provider, MD  metFORMIN (GLUCOPHAGE) 500 MG tablet TAKE ONE TABLET BY MOUTH TWICE DAILY 04/07/16  Yes Monica Sons, MD  montelukast (SINGULAIR) 10 MG tablet Take 10 mg by mouth at bedtime.   Yes Historical Provider, MD  oxybutynin (DITROPAN-XL) 10 MG 24 hr tablet TAKE ONE TABLET EVERY DAY 11/07/15  Yes Monica Sons, MD  pravastatin (PRAVACHOL) 40 MG tablet Take 40 mg by mouth daily.   Yes Historical Provider, MD  theophylline (THEO-24) 100 MG 24 hr capsule Take 100 mg by mouth daily.   Yes Historical Provider, MD  Umeclidinium-Vilanterol [name] ELLIPTA) 62.5-25 MCG/INH AEPB  12/24/14  Yes Historical Provider, MD  warfarin (COUMADIN) 2 MG tablet Take 2 mg by mouth as directed. mondays   Yes Historical Provider, MD  warfarin (COUMADIN) 4 MG tablet Take 4 mg by mouth daily. Except Monday  Yes Historical Provider, MD    Allergies  Allergen Reactions  . Celecoxib Nausea And Vomiting  . Acetaminophen Itching  . Codeine Rash  . Penicillin G Rash  . Petrolatum-Zinc Oxide Rash    History reviewed. No pertinent family history.  Social History Social History  Substance Use Topics  . Smoking status: Former Smoker    Years: 15.00    Quit date: 11/15/1988  . Smokeless tobacco: Never Used  . Alcohol use No    Review of Systems  Constitutional: Negative for fever. Eyes: Negative for visual  changes. ENT: Negative for sore throat. Cardiovascular: Negative for chest pain. Respiratory: Negative for shortness of breath. Gastrointestinal: Negative for abdominal pain, vomiting and diarrhea.Bloody stool, bright red blood per rectum as per history of present illness. Genitourinary: Negative for dysuria. Musculoskeletal: Negative for back pain. Skin: Negative for rash. Neurological: Negative for headache. 10 point Review of Systems otherwise negative ____________________________________________   PHYSICAL EXAM:  VITAL SIGNS: ED Triage Vitals  Enc Vitals Group     BP 08/08/16 0659 130/74     Pulse Rate 08/08/16 0659 (!) 109     Resp 08/08/16 0659 (!) 22     Temp 08/08/16 0659 97.8 F (36.6 C)     Temp Source 08/08/16 0659 Oral     SpO2 08/08/16 0659 98 %     Weight 08/08/16 0700 157 lb (71.2 kg)     Height 08/08/16 0700 5\' 3"  (1.6 m)     Head Circumference --      Peak Flow --      Pain Score 08/08/16 0701 5     Pain Loc --      Pain Edu? --      Excl. in [place]? --      Constitutional: Alert and oriented. Well appearing and in no distress. HEENT   Head: Normocephalic and atraumatic.      Eyes: Conjunctivae are normal. PERRL. Normal extraocular movements.      Ears:         Nose: No congestion/rhinnorhea.   Mouth/Throat: Mucous membranes are moist.   Neck: No stridor. Cardiovascular/Chest: Slightly tachycardic, but regular rhythm.  No murmurs, rubs, or gallops. Respiratory: Normal respiratory effort without tachypnea nor retractions. Breath sounds are clear and equal bilaterally. No wheezes/rales/rhonchi. Gastrointestinal: Soft. No distention, no guarding, no rebound. Nontender.   Genitourinary/rectal: External exam shows non-inflammed hemorrhoids, bloody streaks on pad. Musculoskeletal: Nontender with normal range of motion in all extremities. No joint effusions.  No lower extremity tenderness.  No edema. Neurologic:  Normal speech and language. No gross or  focal neurologic deficits are appreciated. Skin:  Skin is warm, dry and intact. No rash noted. Psychiatric: Mood and affect are normal. Speech and behavior are normal. Patient exhibits appropriate insight and judgment.   ____________________________________________  LABS (pertinent positives/negatives)  Labs Reviewed  COMPREHENSIVE METABOLIC PANEL - Abnormal; Notable for the following:       Result Value   Sodium 129 (*)    Chloride 96 (*)    Glucose, Bld 166 (*)    Calcium 8.7 (*)    Total Protein 6.1 (*)    Albumin 3.3 (*)    Total Bilirubin 0.1 (*)    GFR calc non Af Amer 57 (*)    All other components within normal limits  CBC - Abnormal; Notable for the following:    RBC 3.36 (*)    Hemoglobin 8.9 (*)    HCT 26.7 (*)    MCV 79.6 (*)  RDW 14.7 (*)    All other components within normal limits  PROTIME-INR - Abnormal; Notable for the following:    Prothrombin Time 40.1 (*)    All other components within normal limits  APTT - Abnormal; Notable for the following:    aPTT 47 (*)    All other components within normal limits  TROPONIN I  POC OCCULT BLOOD, ED  TYPE AND SCREEN    ____________________________________________    EKG I, Lisa Roca, MD, the attending physician have personally viewed and interpreted all ECGs.  107 bpm. Appears to be sinus rhythm with first-degree AV block. Low voltage EKG. Normal axis. Nonspecific ST and T-wave ____________________________________________  RADIOLOGY All Xrays were viewed by me. Imaging interpreted by Radiologist.  None __________________________________________  PROCEDURES  Procedure(s) performed: None  Critical Care performed: CRITICAL CARE Performed by: Lisa Roca   Total critical care time: 60 minutes  Critical care time was exclusive of separately billable procedures and treating other patients.  Critical care was necessary to treat or prevent imminent or life-threatening deterioration.  Critical  care was time spent personally by me on the following activities: development of treatment plan with patient and/or surrogate as well as nursing, discussions with consultants, evaluation of patient's response to treatment, examination of patient, obtaining history from patient or surrogate, ordering and performing treatments and interventions, ordering and review of laboratory studies, ordering and review of radiographic studies, pulse oximetry and re-evaluation of patient's condition.   ____________________________________________   ED COURSE / ASSESSMENT AND PLAN  Pertinent labs & imaging results that were available during my care of the patient were reviewed by me and considered in my medical decision making (see chart for details).   Ms. Zappa came in complaining of at least 12 hours of intermittent rectal bleeding that sounds like a fair amount in terms of dark bloody clots and bright red blood. She is slightly tachycardic here with out any hypotension. When I reviewed her laboratory studies her hemoglobin from 08/02/16 was 12.3, and now today has dropped to 8.9. I started IV fluid normal saline bolus 1 L. She was typed and screened.  She does take warfarin and states her most recent INR was 3.6. Because of the concerning rectal bleeding which is moderate to severe, I think that she needs immediate reversal and I ordered Kcentra and vitamin K.  Unfortunately there is no GI coverage here at [facility] over the weekend including today, and Y discussed with her that she needs to be transferred to a [name] that has gastroenterology for consultation and possible procedure, she was very hesitant to be transferred. We did discuss the risk of continued bleeding which could be life threatening, and that I definitely recommend that she be at a [name] with GI coverage.  She asked to speak with Dr. Jamal Collin, her general surgeon, whom I did try to convince her to transfer for GI coverage, but she  is adamant that she does not want to transfer to [facility] or [name].  She spoke with her daughter by phone before I was in the room and stated her daughter was on her way here from [place]. I asked Ms. Hachey if I could speak with her daughter on speaker phone with her, and she refused, stating that she did not want me speaking to her daughter.  She is alert and oriented and at this point able to make her own decisions, and is able to consistently speak back the risks and benefits I discussed  with her.  She has asked that I not call her daughter, and I will respect her wishes.  I spoke with Dr. Tressia Miners, hospitalist and explained patient refuses transfer even though we do not have GI consult available and may not have vascular consult available in case bleeding scan shows active vascular bleeding, but patient requests to stay on a treat from be treated medically without procedures, understanding risk of worsening including death.    CONSULTATIONS:   Dr. Tressia Miners, hospitalist for step down admission.   Patient / Family / Caregiver informed of clinical course, medical decision-making process, and agree with plan.     ___________________________________________   FINAL CLINICAL IMPRESSION(S) / ED DIAGNOSES   Final diagnoses:  Acute GI bleeding  Rectal bleeding  Supratherapeutic INR  Acute hyponatremia  Anemia due to blood loss, acute              Note: This dictation was prepared with Dragon dictation. Any transcriptional errors that result from this process are unintentional    Lisa Roca, MD 08/08/16 [phone]

## 2016-08-08 NOTE — ED Notes (Signed)
Admitting MD at bedside.

## 2016-08-08 NOTE — Progress Notes (Signed)
ANTICOAGULATION CONSULT NOTE - Initial Consult  Pharmacy Consult for [facility] Indication: Reversal of GIB  Allergies  Allergen Reactions  . Celecoxib Nausea And Vomiting  . Acetaminophen Itching  . Codeine Rash  . Penicillin G Rash  . Petrolatum-Zinc Oxide Rash    Patient Measurements: Height: 5\' 3"  (160 cm) Weight: 157 lb (71.2 kg) IBW/kg (Calculated) : 52.4   Vital Signs: Temp: 97.8 F (36.6 C) (09/24 0659) Temp Source: Oral (09/24 0659) BP: 129/53 (09/24 1000) Pulse Rate: 99 (09/24 1000)  Labs:  Recent Labs  08/08/16 0706  HGB 8.9*  HCT 26.7*  PLT 274  APTT 47*  LABPROT 40.1*  INR 4.00  CREATININE 0.88  TROPONINI <0.03    Estimated Creatinine Clearance: 41.8 mL/min (by C-G formula based on SCr of 0.88 mg/dL).   Medical History: Past Medical History:  Diagnosis Date  . Atrial fibrillation ([place])   . Bronchitis   . COPD (chronic obstructive pulmonary disease) ([place])   . Diabetes mellitus without complication ([place])   . Hyperlipidemia   . Hypertension   . Pancreatitis   . Pneumonia   . Stroke ([place])   . Vitamin B12 deficiency      Assessment: 82 yo female on warfarin PTA for AFib and DVTs with GIB and elevated INR of 4.0. Pt s/p ~30 units/kg of KCentra 9/24 at 0906 (based on vial size and INR, rounded up from ordered dose of 25 units/kg; ordering MD not available to discuss at time of order verification as MD had to respond to a code blue in the OR, informed MD of dose adjustment after the fact).  Pt also received vit K 10 mg IV x1 at 0839.   Goal of Therapy:  Monitor platelets by anticoagulation protocol: Yes   Plan:  Will order INRs 1 h after infusion then q6h x4 then daily x2.  Rocky Morel 08/08/2016,10:04 AM

## 2016-08-08 NOTE — ED Triage Notes (Signed)
EMS pt from [facility] with c/o abd pain/bloating and rectal bleeding since Saturday am; has begun to feel weak; pt says she almost passed out yesterday several times but kept hoping the bleeding would stop and she'd feel better; pt says she's having bright red bleeding; "I think I lost about a pint"; skin warm and dry; pale; alert and oriented x 3; talking in complete coherent sentences

## 2016-08-09 ENCOUNTER — Ambulatory Visit: Payer: Medicare Other

## 2016-08-09 DIAGNOSIS — D62 Acute posthemorrhagic anemia: Secondary | ICD-10-CM

## 2016-08-09 DIAGNOSIS — K625 Hemorrhage of anus and rectum: Secondary | ICD-10-CM

## 2016-08-09 LAB — GLUCOSE, CAPILLARY
GLUCOSE-CAPILLARY: 146 mg/dL — AB (ref 65–99)
GLUCOSE-CAPILLARY: 135 mg/dL — AB (ref 65–99)
Glucose-Capillary: 189 mg/dL — ABNORMAL HIGH (ref 65–99)
Glucose-Capillary: 117 mg/dL — ABNORMAL HIGH (ref 65–99)

## 2016-08-09 LAB — CBC
HEMATOCRIT: 24.6 % — AB (ref 35.0–47.0)
Hemoglobin: 8.6 g/dL — ABNORMAL LOW (ref 12.0–16.0)
MCH: 27.7 pg (ref 26.0–34.0)
MCHC: 34.8 g/dL (ref 32.0–36.0)
MCV: 79.7 fL — AB (ref 80.0–100.0)
Platelets: 220 10*3/uL (ref 150–440)
RBC: 3.09 MIL/uL — ABNORMAL LOW (ref 3.80–5.20)
RDW: 14.6 % — AB (ref 11.5–14.5)
WBC: 6.2 10*3/uL (ref 3.6–11.0)

## 2016-08-09 LAB — BASIC METABOLIC PANEL
Anion gap: 4 — ABNORMAL LOW (ref 5–15)
BUN: 13 mg/dL (ref 6–20)
CO2: 26 mmol/L (ref 22–32)
Calcium: 8.4 mg/dL — ABNORMAL LOW (ref 8.9–10.3)
Chloride: 107 mmol/L (ref 101–111)
Creatinine, Ser: 0.74 mg/dL (ref 0.44–1.00)
GFR calc Af Amer: >60 mL/min (ref >60)
GLUCOSE: 158 mg/dL — AB (ref 65–99)
POTASSIUM: 3.9 mmol/L (ref 3.5–5.1)
Sodium: 137 mmol/L (ref 135–145)

## 2016-08-09 LAB — HEMOGLOBIN AND HEMATOCRIT, BLOOD
HEMATOCRIT: 24.3 % — AB (ref 35.0–47.0)
HEMATOCRIT: 27.3 % — AB (ref 35.0–47.0)
HEMATOCRIT: 28.5 % — AB (ref 35.0–47.0)
HEMOGLOBIN: 8.4 g/dL — AB (ref 12.0–16.0)
HEMOGLOBIN: 9.4 g/dL — AB (ref 12.0–16.0)
Hemoglobin: 9.4 g/dL — ABNORMAL LOW (ref 12.0–16.0)

## 2016-08-09 LAB — PROTIME-INR
INR: 1.07
INR: 1.1
PROTHROMBIN TIME: 13.9 s (ref 11.4–15.2)
Prothrombin Time: 14.2 seconds (ref 11.4–15.2)

## 2016-08-09 MED ORDER — INFLUENZA VAC SPLIT QUAD 0.5 ML IM SUSY
0.5000 mL | PREFILLED_SYRINGE | INTRAMUSCULAR | Status: AC
  Administered 2016-08-11: 0.5 mL via INTRAMUSCULAR
  Filled 2016-08-09: qty 0.5

## 2016-08-09 NOTE — Progress Notes (Addendum)
ANTICOAGULATION CONSULT NOTE - Initial Consult  Pharmacy Consult for [facility] Indication: Reversal of GIB  Allergies  Allergen Reactions  . Sulfa Antibiotics Swelling  . Celecoxib Nausea And Vomiting  . Acetaminophen Itching  . Codeine Rash  . Penicillin G Rash  . Petrolatum-Zinc Oxide Rash    Patient Measurements: Height: 5\' 4"  (162.6 cm) Weight: 156 lb 12 oz (71.1 kg) IBW/kg (Calculated) : 54.7   Vital Signs: Temp: 98.2 F (36.8 C) (09/24 2000) Temp Source: Oral (09/24 2000) BP: 119/49 (09/25 0000) Pulse Rate: 83 (09/25 0000)  Labs:  Recent Labs  08/08/16 0706 08/08/16 1040 08/08/16 1624 08/08/16 2238  HGB 8.9*  --  9.1* 8.3*  HCT 26.7*  --   --  24.0*  PLT 274  --   --   --   APTT 47*  --   --   --   LABPROT 40.1* 14.2 13.8 14.1  INR 4.00 1.10 1.06 1.09  CREATININE 0.88  --   --   --   TROPONINI <0.03  --   --   --     Estimated Creatinine Clearance: 42.8 mL/min (by C-G formula based on SCr of 0.88 mg/dL).   Medical History: Past Medical History:  Diagnosis Date  . Atrial fibrillation ([place])   . Bronchitis   . Cancer ([place])    Left leg growth, kidneys, lungs and breasts  . COPD (chronic obstructive pulmonary disease) ([place])   . Diabetes mellitus without complication ([place])   . Hyperlipidemia   . Hypertension   . Pancreatitis   . Pneumonia   . Stroke ([place])   . Vitamin B12 deficiency      Assessment: 82 yo female on warfarin PTA for AFib and DVTs with GIB and elevated INR of 4.0. Pt s/p ~30 units/kg of KCentra 9/24 at 0906 (based on vial size and INR, rounded up from ordered dose of 25 units/kg; ordering MD not available to discuss at time of order verification as MD had to respond to a code blue in the OR, informed MD of dose adjustment after the fact).  Pt also received vit K 10 mg IV x1 at 0839.   Goal of Therapy:  Monitor platelets by anticoagulation protocol: Yes   Plan:  Will order INRs 1 h after infusion then q6h x4 then daily x2.  9/24  16:30 INR 1.06 9/24 22:30 INR 1.09 9/25 04:00 INR 1.07  Kenly Henckel S 08/09/2016,12:38 AM

## 2016-08-09 NOTE — Consult Note (Signed)
[name], MD [address] [place], [place] [address] Phone: [phone] Fax : [phone]  Consultation  Referring Provider:     No ref. provider found Primary Care Physician:  Allyne Gee, MD Primary Gastroenterologist:  None.         Reason for Consultation:     Hematochezia  Date of Admission:  08/08/2016 Date of Consultation:  08/09/2016         HPI:   [name] is a 82 y.o. female who comes in with a history of rectal bleeding.  The patient reports that her rectal bleeding started 3 days ago with a large amount of bright red blood per rectum.  The patient then had another episode of rectal bleeding the next day.  The patient has not had any further bleeding and her hemoglobin has remained stable.  The patient does report that she has recently been told that she has a lesion on her leg that is cancerous. She does also report that she has a history of hemorrhoids and has had an attack of diverticulitis in the past.  She denies any history of rectal bleeding prior to this.  She has chronic abdominal pain but denies the pain being any worse around the time that she had the bleeding.  There is no report of any unexplained weight loss, nausea or vomiting.  Past Medical History:  Diagnosis Date  . Atrial fibrillation ([place])   . Bronchitis   . Cancer ([place])    Left leg growth, kidneys, lungs and breasts  . COPD (chronic obstructive pulmonary disease) ([place])   . Diabetes mellitus without complication ([place])   . Hyperlipidemia   . Hypertension   . Pancreatitis   . Pneumonia   . Stroke ([place])   . Vitamin B12 deficiency     Past Surgical History:  Procedure Laterality Date  . ABDOMINAL HYSTERECTOMY  1975  . BLADDER SUSPENSION  1989  . COLONOSCOPY      Prior to Admission medications   Medication Sig Start Date End Date Taking? Authorizing Provider  albuterol (PROVENTIL HFA;VENTOLIN HFA) 108 (90 Base) MCG/ACT inhaler Inhale 1 puff into the lungs every 4 (four)  hours as needed for wheezing or shortness of breath.   Yes Historical Provider, MD  amLODipine-benazepril (LOTREL) 5-20 MG capsule TAKE 1 CAPSULE BY MOUTH EVERY DAY 04/07/16  Yes [name], MD  b complex vitamins capsule Take 1 capsule by mouth daily.   Yes Historical Provider, MD  Fluticasone Furoate-Vilanterol 100-25 MCG/INH AEPB Inhale into the lungs.   Yes Historical Provider, MD  gabapentin (NEURONTIN) 300 MG capsule TAKE 2 CAPSULES BY MOUTH AT BEDTIME 12/31/15  Yes [name], MD  glipiZIDE (GLIPIZIDE XL) 2.5 MG 24 hr tablet Take 1 tablet (2.5 mg total) by mouth daily. PATIENT NEEDS TO SCHEDULE OFFICE VISIT FOR FOLLOW UP 06/04/16  Yes [name], MD  hydrochlorothiazide (HYDRODIURIL) 25 MG tablet Take 25 mg by mouth daily.   Yes Historical Provider, MD  metFORMIN (GLUCOPHAGE) 500 MG tablet TAKE ONE TABLET BY MOUTH TWICE DAILY 04/07/16  Yes [name], MD  montelukast (SINGULAIR) 10 MG tablet Take 10 mg by mouth at bedtime.   Yes Historical Provider, MD  oxybutynin (DITROPAN-XL) 10 MG 24 hr tablet TAKE ONE TABLET EVERY DAY 11/07/15  Yes [name], MD  pravastatin (PRAVACHOL) 40 MG tablet Take 40 mg by mouth daily.   Yes Historical Provider, MD  theophylline (THEO-24) 100 MG 24 hr capsule  Take 100 mg by mouth daily.   Yes Historical Provider, MD  Umeclidinium-Vilanterol [name] ELLIPTA) 62.5-25 MCG/INH AEPB  12/24/14  Yes Historical Provider, MD  warfarin (COUMADIN) 2 MG tablet Take 2 mg by mouth as directed. mondays   Yes Historical Provider, MD  warfarin (COUMADIN) 4 MG tablet Take 4 mg by mouth daily. Except Monday   Yes Historical Provider, MD    History reviewed. No pertinent family history.   Social History  Substance Use Topics  . Smoking status: Former Smoker    Packs/day: 0.50    Years: 15.00    Types: Cigarettes    Quit date: 11/15/1988  . Smokeless tobacco: Never Used  . Alcohol use No    Allergies as of 08/08/2016 - Review Complete 08/08/2016  Allergen  Reaction Noted  . Sulfa antibiotics Swelling 08/08/2016  . Celecoxib Nausea And Vomiting 12/09/2015  . Acetaminophen Itching 12/09/2015  . Codeine Rash 12/09/2015  . Penicillin g Rash 12/09/2015  . Petrolatum-zinc oxide Rash 12/09/2015    Review of Systems:    All systems reviewed and negative except where noted in HPI.   Physical Exam:  Vital signs in last 24 hours: Temp:  [97.7 F (36.5 C)-97.9 F (36.6 C)] 97.7 F (36.5 C) (09/25 1946) Pulse Rate:  [44-106] 106 (09/25 1946) Resp:  [14-26] 19 (09/25 1946) BP: (111-154)/(40-80) 154/60 (09/25 1946) SpO2:  [94 %-100 %] 100 % (09/25 1946) Last BM Date: 08/08/16 General:   Pleasant, cooperative in NAD Head:  Normocephalic and atraumatic. Eyes:   No icterus.   Conjunctiva pink. PERRLA. Ears:  Normal auditory acuity. Neck:  Supple; no masses or thyroidomegaly Lungs: Respirations even and unlabored. Lungs clear to auscultation bilaterally.   No wheezes, crackles, or rhonchi.  Heart:  Regular rate and rhythm;  Without murmur, clicks, rubs or gallops Abdomen:  Soft, nondistended, nontender. Normal bowel sounds. No appreciable masses or hepatomegaly.  No rebound or guarding.  Rectal:  Not performed. Msk:  Symmetrical without gross deformities.    Extremities:  Without edema, cyanosis or clubbing. Neurologic:  Alert and oriented x3;  grossly normal neurologically. Skin:  Intact without significant lesions or rashes. Cervical Nodes:  No significant cervical adenopathy. Psych:  Alert and cooperative. Normal affect.  LAB RESULTS:  Recent Labs  08/08/16 0706  08/09/16 0419 08/09/16 1040 08/09/16 1645  WBC 9.2  --  6.2  --   --   HGB 8.9*  < > 8.6* 8.4* 9.4*  HCT 26.7*  < > 24.6* 24.3* 27.3*  PLT 274  --  220  --   --   < > = values in this interval not displayed. BMET  Recent Labs  08/08/16 0706 08/09/16 0419  NA 129* 137  K 4.5 3.9  CL 96* 107  CO2 26 26  GLUCOSE 166* 158*  BUN 18 13  CREATININE 0.88 0.74  CALCIUM  8.7* 8.4*   LFT  Recent Labs  08/08/16 0706  PROT 6.1*  ALBUMIN 3.3*  AST 22  ALT 14  ALKPHOS 66  BILITOT 0.1*   PT/INR  Recent Labs  08/09/16 0419 08/09/16 1040  LABPROT 13.9 14.2  INR 1.07 1.10    STUDIES: No results found.    Impression / Plan:   [name] is a 82 y.o. y/o female who is admitted with rectal bleeding.  The patient has been told the risks and benefits of doing a colonoscopy to look for the source of her bleeding.  The patient reports that she  does not want to be put to sleep nor did she want any invasive procedures done at her age.  The patient has been told that if she has any further bleeding or changes her mind we can reconsider doing procedures on her. The patient and her daughter have been explained the plan and agree with it. I will sign off.  Please call if any further GI concerns or questions.  We would like to thank you for the opportunity to participate in the care of [name].    Thank you for involving me in the care of this patient.      LOS: 1 day   [name], MD  08/09/2016, 8:20 PM   Note: This dictation was prepared with Dragon dictation along with smaller phrase technology. Any transcriptional errors that result from this process are unintentional.

## 2016-08-09 NOTE — Progress Notes (Addendum)
Patient ID: Monica [name], female   DOB: [birth year], 82 y.o.   MRN: [id number]    [place] [facility] [place] NAME: Monica [name]    MR#:  [id number]  DATE OF BIRTH:  11-Dec-1925  SUBJECTIVE:  CHIEF COMPLAINT:   Chief Complaint  Patient presents with  . Rectal Bleeding  Patient seen and examined at bedside with her daughter present. She reports that she has not had any additional bloody bowel movements since 6 PM last night. She is status post 1 unit of packed red blood cells. She is status post KCentra and vitamin C in the emergency department for reversal of an INR greater than 4. She denies fevers, chills, abdominal pain. Awaiting GI  REVIEW OF SYSTEMS:  ROS CONSTITUTIONAL: No fever/chills, fatigue, weakness, weight gain/loss, headache. EYES: No blurry or double vision. ENT: No tinnitus, postnasal drip, redness or soreness of the oropharynx. RESPIRATORY: No cough, dyspnea, wheeze, hemoptysis.  CARDIOVASCULAR: No chest pain, palpitations, syncope, orthopnea,  GASTROINTESTINAL: No nausea, vomiting, constipation, diarrhea, abdominal pain, hematemesis, melena or hematochezia. GENITOURINARY: No dysuria, frequency, hematuria. ENDOCRINE: No polyuria or nocturia. No heat or cold intolerance. HEMATOLOGY: No anemia, bruising, bleeding. INTEGUMENTARY: No rashes, ulcers, lesions. MUSCULOSKELETAL: No arthritis, gout, dyspnea.  NEUROLOGIC: No numbness, tingling, ataxia, seizure-type activity, weakness. PSYCHIATRIC: No anxiety, depression, insomnia.   DRUG ALLERGIES:   Allergies  Allergen Reactions  . Sulfa Antibiotics Swelling  . Celecoxib Nausea And Vomiting  . Acetaminophen Itching  . Codeine Rash  . Penicillin G Rash  . Petrolatum-Zinc Oxide Rash   VITALS:  Blood pressure 111/80, pulse 85, temperature 97.7 F (36.5 C), temperature source Oral, resp. rate 20, height 5\' 4"  (1.626 m), weight 71.1 kg (156 lb 12 oz), SpO2 99 %. PHYSICAL EXAMINATION:    Physical Exam  PHYSICAL EXAMINATION: VITAL SIGNS: Blood pressure 111/80, pulse 85, temperature 97.7 F (36.5 C), temperature source Oral, resp. rate 20, height 5\' 4"  (1.626 m), weight 71.1 kg (156 lb 12 oz), SpO2 99 %.  GENERAL: 82 y.o.-year-old white female patient, well-developed, well-nourished lying in the bed in no acute distress.  Pleasant and cooperative.   HEENT: Head atraumatic, normocephalic. Pupils equal, round, reactive to light and accommodation. No scleral icterus. Extraocular muscles intact. Nares are patent. Oropharynx is clear. Mucus membranes moist. NECK: Supple, full range of motion. No JVD, no bruit heard. No thyroid enlargement, no tenderness, no cervical lymphadenopathy. CHEST: Normal breath sounds bilaterally. No wheezing, rales, rhonchi or crackles. No use of accessory muscles of respiration.  No reproducible chest wall tenderness.  CARDIOVASCULAR: S1, S2 normal. SEM@LSB . No rubs, or gallops. Cap refill <2 seconds. ABDOMEN: Soft, mild tenderness at bilateral lower quadrants., nondistended. No rebound, guarding, rigidity. Normoactive bowel sounds present in all four quadrants. No organomegaly or mass. EXTREMITIES: Full range of motion. No pedal edema, cyanosis, or clubbing. NEUROLOGIC: Cranial nerves II through XII are grossly intact with no focal sensorimotor deficit. Muscle strength 5/5 in all extremities. Sensation intact. Gait not checked. PSYCHIATRIC: The patient is alert and oriented x 3. Normal affect, mood, thought content. SKIN: Warm, dry, and intact without obvious rash, lesion, or ulcer.  LABORATORY PANEL:   CBC  Recent Labs Lab 08/09/16 0419 08/09/16 1040  WBC 6.2  --   HGB 8.6* 8.4*  HCT 24.6* 24.3*  PLT 220  --    ------------------------------------------------------------------------------------------------------------------ Chemistries   Recent Labs Lab 08/08/16 0706 08/09/16 0419  NA 129* 137  K 4.5 3.9  CL 96* 107  CO2 26 26   GLUCOSE 166* 158*  BUN 18 13  CREATININE 0.88 0.74  CALCIUM 8.7* 8.4*  AST 22  --   ALT 14  --   ALKPHOS 66  --   BILITOT 0.1*  --    RADIOLOGY:  No results found. ASSESSMENT AND PLAN:   Assessment/Plan 1. GI Bleed: Suspect lower GI. Has history of hemrrhoids. Patient is status post FFP, vitamin K and transfusion of one unit of packed red blood cells with a stable hemoglobin. Vital signs are stable with no additional rectal bleeding today. Await GI consult.  Will downgrade to med-surg with tele monitoring.   2. Acute blood loss anemia: Secondary to GI bleeding. Stable. Follow CBC  3. Coagulopathy: INR 4. On coumadin for history of multiple DVTs. Given vit k and FFP. Monitor INR.  4. Hx DVT: Will have to be off anticoagulation for now.  5. UTI: Start Cipro and get cultures.   All the records are reviewed and case discussed with Care Management/Social Worker. Management plans discussed with the patient, family and they are in agreement.  CODE STATUS: Full  TOTAL TIME TAKING CARE OF THIS PATIENT: 30 minutes.   More than 50% of the time was spent in counseling/coordination of care: YES  POSSIBLE D/C IN 1-2 DAYS, DEPENDING ON CLINICAL CONDITION.   Harvie Bridge M.D on 08/09/2016 at 12:48 PM  Between 7am to 6pm - Pager - [phone]  After 6pm go to [email or web address] - Proofreader  [facility]  Office  [phone]  CC: Primary care physician; Allyne Gee, MD  Note: This dictation was prepared with Dragon dictation along with smaller phrase technology. Any transcriptional errors that result from this process are unintentional.

## 2016-08-09 NOTE — Progress Notes (Signed)
Dr. Jannifer Franklin notified of pt's hgl of 8.3.  Pt remains stable with no s/s of hypovolemia. Will cont to monitor and notify MD of any changes in status. No new orders given at this time.

## 2016-08-10 ENCOUNTER — Ambulatory Visit: Admission: RE | Admit: 2016-08-10 | Payer: Medicare Other | Source: Ambulatory Visit

## 2016-08-10 LAB — BASIC METABOLIC PANEL
Anion gap: 3 — ABNORMAL LOW (ref 5–15)
BUN: 9 mg/dL (ref 6–20)
CO2: 26 mmol/L (ref 22–32)
CREATININE: 0.74 mg/dL (ref 0.44–1.00)
Calcium: 8.3 mg/dL — ABNORMAL LOW (ref 8.9–10.3)
Chloride: 108 mmol/L (ref 101–111)
GFR calc Af Amer: >60 mL/min (ref >60)
Glucose, Bld: 134 mg/dL — ABNORMAL HIGH (ref 65–99)
Potassium: 3.6 mmol/L (ref 3.5–5.1)
SODIUM: 137 mmol/L (ref 135–145)

## 2016-08-10 LAB — CBC
HCT: 23.4 % — ABNORMAL LOW (ref 35.0–47.0)
Hemoglobin: 7.8 g/dL — ABNORMAL LOW (ref 12.0–16.0)
MCH: 27 pg (ref 26.0–34.0)
MCHC: 33.3 g/dL (ref 32.0–36.0)
MCV: 81.1 fL (ref 80.0–100.0)
PLATELETS: 223 10*3/uL (ref 150–440)
RBC: 2.89 MIL/uL — AB (ref 3.80–5.20)
RDW: 14.4 % (ref 11.5–14.5)
WBC: 7.1 10*3/uL (ref 3.6–11.0)

## 2016-08-10 LAB — HEMOGLOBIN AND HEMATOCRIT, BLOOD
HCT: 24.5 % — ABNORMAL LOW (ref 35.0–47.0)
HCT: 29.9 % — ABNORMAL LOW (ref 35.0–47.0)
HCT: 31.9 % — ABNORMAL LOW (ref 35.0–47.0)
HEMOGLOBIN: 8.4 g/dL — AB (ref 12.0–16.0)
Hemoglobin: 10.2 g/dL — ABNORMAL LOW (ref 12.0–16.0)
Hemoglobin: 11.5 g/dL — ABNORMAL LOW (ref 12.0–16.0)

## 2016-08-10 LAB — GLUCOSE, CAPILLARY
GLUCOSE-CAPILLARY: 144 mg/dL — AB (ref 65–99)
Glucose-Capillary: 153 mg/dL — ABNORMAL HIGH (ref 65–99)
Glucose-Capillary: 111 mg/dL — ABNORMAL HIGH (ref 65–99)
Glucose-Capillary: 169 mg/dL — ABNORMAL HIGH (ref 65–99)

## 2016-08-10 LAB — PROTIME-INR
INR: 1.12
PROTHROMBIN TIME: 14.5 s (ref 11.4–15.2)

## 2016-08-10 MED ORDER — CEPHALEXIN 500 MG PO CAPS
500.0000 mg | ORAL_CAPSULE | Freq: Two times a day (BID) | ORAL | Status: DC
  Administered 2016-08-10 – 2016-08-11 (×3): 500 mg via ORAL
  Filled 2016-08-10 (×3): qty 1

## 2016-08-10 MED ORDER — SODIUM CHLORIDE 0.9 % IV SOLN
Freq: Once | INTRAVENOUS | Status: AC
  Administered 2016-08-10: 10:00:00 via INTRAVENOUS

## 2016-08-10 NOTE — Progress Notes (Signed)
Pt's hemoglobin level today is 7.8 from a previous 9.4. Pt admitted for GI bleed and was seen by GI doctor yesterday. On call Dr. Marcille Blanco paged and made aware of the hemoglobin result, no orders made as of this time and said to wait for the rounding MD's this morning. Pt is alert and oriented, no hematemesis nor hematochezia noted during the night.

## 2016-08-10 NOTE — H&P (Addendum)
[name] NAME: Monica [name]    MR#:  [id number]  DATE OF BIRTH:  1926/04/28  SUBJECTIVE:  CHIEF COMPLAINT:   Chief Complaint  Patient presents with  . Rectal Bleeding   Patient seen and examined at bedside. States she is feeling significantly better. She reports that she has not had any additional bloody bowel movements.  She is status post 1 unit of packed red blood cells. She is status post KCentra and vitamin C in the emergency department for reversal of an INR greater than 4. She denies fevers, chills, abdominal pain. GI consultation and recommendations appreciated REVIEW OF SYSTEMS:  ROS CONSTITUTIONAL: No fever/chills, fatigue, weakness, weight gain/loss, headache. EYES: No blurry or double vision. ENT: No tinnitus, postnasal drip, redness or soreness of the oropharynx. RESPIRATORY: No cough, dyspnea, wheeze, hemoptysis.  CARDIOVASCULAR: No chest pain, palpitations, syncope, orthopnea,  GASTROINTESTINAL: No nausea, vomiting, constipation, diarrhea, abdominal pain, hematemesis, melena or hematochezia. GENITOURINARY: No dysuria, frequency, hematuria. ENDOCRINE: No polyuria or nocturia. No heat or cold intolerance. HEMATOLOGY: No anemia, bruising, bleeding. INTEGUMENTARY: No rashes, ulcers, lesions. MUSCULOSKELETAL: No arthritis, gout, edema NEUROLOGIC: No numbness, tingling, ataxia, seizure-type activity, weakness. PSYCHIATRIC: No anxiety, depression, insomnia.   DRUG ALLERGIES:   Allergies  Allergen Reactions  . Sulfa Antibiotics Swelling  . Celecoxib Nausea And Vomiting  . Acetaminophen Itching  . Codeine Rash  . Penicillin G Rash  . Petrolatum-Zinc Oxide Rash   VITALS:  Blood pressure (!) 140/55, pulse 78, temperature 97.8 F (36.6 C), temperature source Oral, resp. rate 18, height 5\' 4"  (1.626 m), weight 71.1 kg (156 lb 12 oz), SpO2 98 %. PHYSICAL EXAMINATION:  Physical Exam   GENERAL: 82 y.o.-year-old  white female patient, well-developed, well-nourished lying in the bed in no acute distress.  Pleasant and cooperative.   HEENT: Head atraumatic, normocephalic. Pupils equal, round, reactive to light and accommodation. No scleral icterus. Extraocular muscles intact. Nares are patent. Oropharynx is clear. Mucus membranes moist. NECK: Supple, full range of motion. No JVD, no bruit heard. No thyroid enlargement, no tenderness, no cervical lymphadenopathy. CHEST: Normal breath sounds bilaterally. No wheezing, rales, rhonchi or crackles. No use of accessory muscles of respiration.  No reproducible chest wall tenderness.  CARDIOVASCULAR: S1, S2 normal. SEM@LSB . No rubs, or gallops. Cap refill <2 seconds. ABDOMEN: Soft, mild tenderness at bilateral lower quadrants., nondistended. No rebound, guarding, rigidity. Normoactive bowel sounds present in all four quadrants. No organomegaly or mass. EXTREMITIES: Full range of motion. No pedal edema, cyanosis, or clubbing. NEUROLOGIC: Cranial nerves II through XII are grossly intact with no focal sensorimotor deficit. Muscle strength 5/5 in all extremities. Sensation intact. Gait not checked. PSYCHIATRIC: The patient is alert and oriented x 3. Normal affect, mood, thought content. SKIN: Warm, dry, and intact without obvious rash, lesion, or ulcer. LABORATORY PANEL:   CBC  Recent Labs Lab 08/10/16 0422 08/10/16 1023  WBC 7.1  --   HGB 7.8* 8.4*  HCT 23.4* 24.5*  PLT 223  --    ------------------------------------------------------------------------------------------------------------------ Chemistries   Recent Labs Lab 08/08/16 0706  08/10/16 0422  NA 129*  < > 137  K 4.5  < > 3.6  CL 96*  < > 108  CO2 26  < > 26  GLUCOSE 166*  < > 134*  BUN 18  < > 9  CREATININE 0.88  < > 0.74  CALCIUM 8.7*  < > 8.3*  AST 22  --   --  ALT 14  --   --   ALKPHOS 66  --   --   BILITOT 0.1*  --   --   < > = values in this interval not displayed. RADIOLOGY:  No  results found. ASSESSMENT AND PLAN:   Assessment/Plan 1. GI Bleed: Stable no additional bloody bowel movements during hospitalization. GI conditions appreciated. We'll transfuse an additional 2 units of packed red blood cells and discharged for close outpatient follow-up.  2. Acute blood loss anemia: Secondary to GI bleeding. Stable. Follow CBC  3. Coagulopathy: INR 4. On coumadin for history of multiple DVTs. We'll resume Coumadin on discharge with a goal INR of 2-2.5.  4. Hx DVT: Will have to be off anticoagulation for now. SCDs and early ambulation  5. UTI: Urine cultures positive for greater than 100,000 CFU's Escherichia coli. Sensitivities pending.  Change Cipro to Keflex given interaction with Theophylline.   Discharge planning for a.m. 08/11/2016  PLEASE note the patient requested that her medical information only be shared with her. She does not want anyone to speak with her daughter if the patient herself does not present   Management plans discussed with the patient, family and they are in agreement.  CODE STATUS:  full  TOTAL TIME TAKING CARE OF THIS PATIENT:  20 minutes.   More than 50% of the time was spent in counseling/coordination of care: YES  POSSIBLE D/C IN1 DAYS, DEPENDING ON CLINICAL CONDITION.   Harvie Bridge M.D on 08/10/2016 at 12:44 PM  Between 7am to 6pm - Pager - [phone]  After 6pm go to [email or web address] - Proofreader  [facility]  Office  [phone]  CC: Primary care physician; Allyne Gee, MD  Note: This dictation was prepared with Dragon dictation along with smaller phrase technology. Any transcriptional errors that result from this process are unintentional.

## 2016-08-10 NOTE — Progress Notes (Addendum)
ANTICOAGULATION CONSULT NOTE - Initial Consult  Pharmacy Consult for [facility] Indication: Reversal of GIB  Allergies  Allergen Reactions  . Sulfa Antibiotics Swelling  . Celecoxib Nausea And Vomiting  . Acetaminophen Itching  . Codeine Rash  . Penicillin G Rash  . Petrolatum-Zinc Oxide Rash    Patient Measurements: Height: 5\' 4"  (162.6 cm) Weight: 156 lb 12 oz (71.1 kg) IBW/kg (Calculated) : 54.7   Vital Signs: Temp: 97.9 F (36.6 C) (09/26 0735) Temp Source: Oral (09/26 0735) BP: 113/42 (09/26 0343) Pulse Rate: 94 (09/26 0735)  Labs:  Recent Labs  08/08/16 0706  08/09/16 0419 08/09/16 1040 08/09/16 1645 08/09/16 2235 08/10/16 0422  HGB 8.9*  < > 8.6* 8.4* 9.4* 9.4* 7.8*  HCT 26.7*  < > 24.6* 24.3* 27.3* 28.5* 23.4*  PLT 274  --  220  --   --   --  223  APTT 47*  --   --   --   --   --   --   LABPROT 40.1*  < > 13.9 14.2  --   --  14.5  INR 4.00  < > 1.07 1.10  --   --  1.12  CREATININE 0.88  --  0.74  --   --   --  0.74  TROPONINI <0.03  --   --   --   --   --   --   < > = values in this interval not displayed.  Estimated Creatinine Clearance: 47 mL/min (by C-G formula based on SCr of 0.74 mg/dL).   Medical History: Past Medical History:  Diagnosis Date  . Atrial fibrillation ([place])   . Bronchitis   . Cancer ([place])    Left leg growth, kidneys, lungs and breasts  . COPD (chronic obstructive pulmonary disease) ([place])   . Diabetes mellitus without complication ([place])   . Hyperlipidemia   . Hypertension   . Pancreatitis   . Pneumonia   . Stroke ([place])   . Vitamin B12 deficiency      Assessment: 82 yo female on warfarin PTA for AFib and DVTs with GIB and elevated INR of 4.0. Pt s/p ~30 units/kg of KCentra 9/24 at 0906 (based on vial size and INR, rounded up from ordered dose of 25 units/kg; ordering MD not available to discuss at time of order verification as MD had to respond to a code blue in the OR, informed MD of dose adjustment after the fact).  Pt  also received vit K 10 mg IV x1 at 0839.   Goal of Therapy:  Monitor platelets by anticoagulation protocol: Yes   Plan:  Will order INRs 1 h after infusion then q6h x4 then daily x2.    9/24 16:30 INR 1.06 9/24 22:30 INR 1.09 9/25 04:00 INR 1.07 9/25 10:40 INR  1.10 9/26 04:22  INR 1.12  Loree Fee, PharmD 08/10/2016,7:43 AM

## 2016-08-11 ENCOUNTER — Encounter: Payer: Self-pay | Admitting: *Deleted

## 2016-08-11 ENCOUNTER — Ambulatory Visit
Admission: RE | Admit: 2016-08-11 | Discharge: 2016-08-11 | Disposition: A | Discharge status: Home / Self Care | Payer: Medicare Other | Source: Ambulatory Visit | Attending: Radiation Oncology | Admitting: Radiation Oncology

## 2016-08-11 DIAGNOSIS — C4922 Malignant neoplasm of connective and soft tissue of left lower limb, including hip: Secondary | ICD-10-CM | POA: Diagnosis not present

## 2016-08-11 DIAGNOSIS — C801 Malignant (primary) neoplasm, unspecified: Secondary | ICD-10-CM | POA: Diagnosis not present

## 2016-08-11 DIAGNOSIS — C7989 Secondary malignant neoplasm of other specified sites: Secondary | ICD-10-CM | POA: Diagnosis not present

## 2016-08-11 DIAGNOSIS — J449 Chronic obstructive pulmonary disease, unspecified: Secondary | ICD-10-CM | POA: Diagnosis not present

## 2016-08-11 DIAGNOSIS — R918 Other nonspecific abnormal finding of lung field: Secondary | ICD-10-CM | POA: Diagnosis not present

## 2016-08-11 DIAGNOSIS — I4891 Unspecified atrial fibrillation: Secondary | ICD-10-CM | POA: Diagnosis not present

## 2016-08-11 DIAGNOSIS — Z510 Encounter for antineoplastic radiation therapy: Secondary | ICD-10-CM | POA: Diagnosis not present

## 2016-08-11 LAB — TYPE AND SCREEN
ABO/RH(D): B POS
ANTIBODY SCREEN: NEGATIVE
UNIT DIVISION: 0
UNIT DIVISION: 0
Unit division: 0

## 2016-08-11 LAB — URINE CULTURE: Culture: >=100000 — AB

## 2016-08-11 LAB — GLUCOSE, CAPILLARY
GLUCOSE-CAPILLARY: 87 mg/dL (ref 65–99)
Glucose-Capillary: 138 mg/dL — ABNORMAL HIGH (ref 65–99)

## 2016-08-11 LAB — HEMOGLOBIN AND HEMATOCRIT, BLOOD
HEMATOCRIT: 31.5 % — AB (ref 35.0–47.0)
HEMATOCRIT: 33 % — AB (ref 35.0–47.0)
Hemoglobin: 11 g/dL — ABNORMAL LOW (ref 12.0–16.0)
Hemoglobin: 11.7 g/dL — ABNORMAL LOW (ref 12.0–16.0)

## 2016-08-11 LAB — PROTIME-INR
INR: 1.18
Prothrombin Time: 15.1 seconds (ref 11.4–15.2)

## 2016-08-11 MED ORDER — FAMOTIDINE 20 MG PO TABS: 20.0000 mg | ORAL_TABLET | Freq: Two times a day (BID) | ORAL | 0 refills | Status: DC

## 2016-08-11 MED ORDER — CEPHALEXIN 500 MG PO CAPS: 500.0000 mg | ORAL_CAPSULE | Freq: Two times a day (BID) | ORAL | 0 refills | Status: DC

## 2016-08-11 NOTE — H&P (Signed)
[place] at [place] NAME: Cati Uhle    MR#:  [id number]  DATE OF BIRTH:  Dec 27, 1925  SUBJECTIVE:  CHIEF COMPLAINT:   Chief Complaint  Patient presents with  . Rectal Bleeding    no Further rectal bleeding. Hemoglobin stable at 11. Patient denies any complaints. Eager to go home. ROS CONSTITUTIONAL: No fever/chills, fatigue, weakness, weight gain/loss, headache. EYES: No blurry or double vision. ENT: No tinnitus, postnasal drip, redness or soreness of the oropharynx. RESPIRATORY: No cough, dyspnea, wheeze, hemoptysis.  CARDIOVASCULAR: No chest pain, palpitations, syncope, orthopnea,  GASTROINTESTINAL: No nausea, vomiting, constipation, diarrhea, abdominal pain, hematemesis, melena or hematochezia. GENITOURINARY: No dysuria, frequency, hematuria. ENDOCRINE: No polyuria or nocturia. No heat or cold intolerance. HEMATOLOGY: No anemia, bruising, bleeding. INTEGUMENTARY: No rashes, ulcers, lesions. MUSCULOSKELETAL: No arthritis, gout, edema NEUROLOGIC: No numbness, tingling, ataxia, seizure-type activity, weakness. PSYCHIATRIC: No anxiety, depression, insomnia.   DRUG ALLERGIES:   Allergies  Allergen Reactions  . Sulfa Antibiotics Swelling  . Celecoxib Nausea And Vomiting  . Acetaminophen Itching  . Codeine Rash  . Penicillin G Rash  . Petrolatum-Zinc Oxide Rash   VITALS:  Blood pressure (!) 137/56, pulse 84, temperature 98 F (36.7 C), temperature source Oral, resp. rate 18, height 5\' 4"  (1.626 m), weight 71.1 kg (156 lb 12 oz), SpO2 95 %. PHYSICAL EXAMINATION:  Physical Exam   GENERAL: 82 y.o.-year-old white female patient, well-developed, well-nourished lying in the bed in no acute distress.  Pleasant and cooperative.   HEENT: Head atraumatic, normocephalic. Pupils equal, round, reactive to light and accommodation. No scleral icterus. Extraocular muscles intact. Nares are patent. Oropharynx is clear. Mucus membranes  moist. NECK: Supple, full range of motion. No JVD, no bruit heard. No thyroid enlargement, no tenderness, no cervical lymphadenopathy. CHEST: Normal breath sounds bilaterally. No wheezing, rales, rhonchi or crackles. No use of accessory muscles of respiration.  No reproducible chest wall tenderness.  CARDIOVASCULAR: S1, S2 normal. SEM@LSB . No rubs, or gallops. Cap refill <2 seconds. ABDOMEN: Soft, mild tenderness at bilateral lower quadrants., nondistended. No rebound, guarding, rigidity. Normoactive bowel sounds present in all four quadrants. No organomegaly or mass. EXTREMITIES: Full range of motion. No pedal edema, cyanosis, or clubbing. NEUROLOGIC: Cranial nerves II through XII are grossly intact with no focal sensorimotor deficit. Muscle strength 5/5 in all extremities. Sensation intact. Gait not checked. PSYCHIATRIC: The patient is alert and oriented x 3. Normal affect, mood, thought content. SKIN: Warm, dry, and intact without obvious rash, lesion, or ulcer. LABORATORY PANEL:   CBC  Recent Labs Lab 08/10/16 0422  08/11/16 0414  WBC 7.1  --   --   HGB 7.8*  < > 11.0*  HCT 23.4*  < > 31.5*  PLT 223  --   --   < > = values in this interval not displayed. ------------------------------------------------------------------------------------------------------------------ Chemistries   Recent Labs Lab 08/08/16 0706  08/10/16 0422  NA 129*  < > 137  K 4.5  < > 3.6  CL 96*  < > 108  CO2 26  < > 26  GLUCOSE 166*  < > 134*  BUN 18  < > 9  CREATININE 0.88  < > 0.74  CALCIUM 8.7*  < > 8.3*  AST 22  --   --   ALT 14  --   --   ALKPHOS 66  --   --   BILITOT 0.1*  --   --   < > = values  in this interval not displayed. RADIOLOGY:  No results found. ASSESSMENT AND PLAN:   Assessment/Plan 1. GI Bleed: Resolved.hb  is stable at 11. She refused colonoscopy.  2. Acute blood loss anemia: Secondary to GI bleeding. Stable.  3. Coagulopathy: INR 4. On coumadin for history of multiple  DVTs. F/u appointment with PCP to restart the Coumadin . not started because he required multiple units of transfusion. 4. Hx DVT: Will have to be off anticoagulation for now. SCDs and early ambulation  5. UTI: Urine cultures positive for greater than 100,000 CFU's Escherichia coli. Sensitivities pending.  Change Cipro to Keflex given interaction with Theophylline.   D/c home today D.w  Nurse and patient Nurse present during rounds  PLEASE note the patient requested that her medical information only be shared with her. She does not want anyone to speak with her daughter if the patient herself does not present   Management plans discussed with the patient, family and they are in agreement.  CODE STATUS:  full  TOTAL TIME TAKING CARE OF THIS PATIENT:  20 minutes.   More than 50% of the time was spent in counseling/coordination of care: Trenda Moots M.D on 08/11/2016 at 8:49 AM  Between 7am to 6pm - Pager - [phone]  After 6pm go to [email or web address] - Proofreader  [facility]  Office  [phone]  CC: Primary care physician; Allyne Gee, MD  Note: This dictation was prepared with Dragon dictation along with smaller phrase technology. Any transcriptional errors that result from this process are unintentional.

## 2016-08-11 NOTE — Discharge Instructions (Signed)
Please do not take Coumadin until the seen by primary doctor  Not restarted because of rectal bleed requiring multiple units of transfusion

## 2016-08-11 NOTE — Progress Notes (Signed)
ANTICOAGULATION CONSULT NOTE - Initial Consult  Pharmacy Consult for [facility] Indication: Reversal of GIB  Allergies  Allergen Reactions  . Sulfa Antibiotics Swelling  . Celecoxib Nausea And Vomiting  . Acetaminophen Itching  . Codeine Rash  . Penicillin G Rash  . Petrolatum-Zinc Oxide Rash    Patient Measurements: Height: 5\' 4"  (162.6 cm) Weight: 156 lb 12 oz (71.1 kg) IBW/kg (Calculated) : 54.7   Vital Signs: Temp: 98 F (36.7 C) (09/27 0812) Temp Source: Oral (09/27 0812) BP: 137/56 (09/27 0812) Pulse Rate: 84 (09/27 0523)  Labs:  Recent Labs  08/09/16 0419 08/09/16 1040  08/10/16 0422  08/10/16 1540 08/10/16 2242 08/11/16 0414  HGB 8.6* 8.4*  < > 7.8*  < > 10.2* 11.5* 11.0*  HCT 24.6* 24.3*  < > 23.4*  < > 29.9* 31.9* 31.5*  PLT 220  --   --  223  --   --   --   --   LABPROT 13.9 14.2  --  14.5  --   --   --  15.1  INR 1.07 1.10  --  1.12  --   --   --  1.18  CREATININE 0.74  --   --  0.74  --   --   --   --   < > = values in this interval not displayed.  Estimated Creatinine Clearance: 47 mL/min (by C-G formula based on SCr of 0.74 mg/dL).   Medical History: Past Medical History:  Diagnosis Date  . Atrial fibrillation ([place])   . Bronchitis   . Cancer ([place])    Left leg growth, kidneys, lungs and breasts  . COPD (chronic obstructive pulmonary disease) ([place])   . Diabetes mellitus without complication ([place])   . Hyperlipidemia   . Hypertension   . Pancreatitis   . Pneumonia   . Stroke ([place])   . Vitamin B12 deficiency      Assessment: 82 yo female on warfarin PTA for AFib and DVTs with GIB and elevated INR of 4.0. Pt s/p ~30 units/kg of KCentra 9/24 at 0906 (based on vial size and INR, rounded up from ordered dose of 25 units/kg; ordering MD not available to discuss at time of order verification as MD had to respond to a code blue in the OR, informed MD of dose adjustment after the fact).  Pt also received vit K 10 mg IV x1 at 0839.   Goal of  Therapy:  Monitor platelets by anticoagulation protocol: Yes   Plan:  Will order INRs 1 h after infusion then q6h x4 then daily x2.    Patient takes warfarin 4mg  daily except 2mg  on Monday. Pt will be discharged on a lower dose of warfarin with INR goal 2-2.5.   9/24 16:30 INR 1.06 9/24 22:30 INR 1.09 9/25 04:00 INR 1.07 9/25 10:40 INR  1.10 9/26 04:22  INR 1.12 9/27 0414   INR 1.18  Loree Fee, PharmD 08/11/2016,8:38 AM

## 2016-08-11 NOTE — Progress Notes (Signed)
DISCHARGE NOTE:  Pt given discharge instructions. Pt verbalized understanding. Pt wheeled to car.   

## 2016-08-12 ENCOUNTER — Ambulatory Visit
Admission: RE | Admit: 2016-08-12 | Discharge: 2016-08-12 | Disposition: A | Discharge status: Home / Self Care | Payer: Medicare Other | Source: Ambulatory Visit | Attending: Radiation Oncology | Admitting: Radiation Oncology

## 2016-08-12 DIAGNOSIS — I10 Essential (primary) hypertension: Secondary | ICD-10-CM | POA: Diagnosis not present

## 2016-08-12 DIAGNOSIS — C7989 Secondary malignant neoplasm of other specified sites: Secondary | ICD-10-CM | POA: Diagnosis not present

## 2016-08-12 DIAGNOSIS — Z79899 Other long term (current) drug therapy: Secondary | ICD-10-CM | POA: Diagnosis not present

## 2016-08-12 DIAGNOSIS — Z7901 Long term (current) use of anticoagulants: Secondary | ICD-10-CM | POA: Diagnosis not present

## 2016-08-12 DIAGNOSIS — Z510 Encounter for antineoplastic radiation therapy: Secondary | ICD-10-CM | POA: Diagnosis not present

## 2016-08-12 DIAGNOSIS — C801 Malignant (primary) neoplasm, unspecified: Secondary | ICD-10-CM | POA: Diagnosis not present

## 2016-08-12 DIAGNOSIS — Z8701 Personal history of pneumonia (recurrent): Secondary | ICD-10-CM | POA: Diagnosis not present

## 2016-08-12 DIAGNOSIS — E538 Deficiency of other specified B group vitamins: Secondary | ICD-10-CM | POA: Diagnosis not present

## 2016-08-12 DIAGNOSIS — Z7984 Long term (current) use of oral hypoglycemic drugs: Secondary | ICD-10-CM | POA: Diagnosis not present

## 2016-08-12 DIAGNOSIS — I4891 Unspecified atrial fibrillation: Secondary | ICD-10-CM | POA: Diagnosis not present

## 2016-08-12 DIAGNOSIS — Z8673 Personal history of transient ischemic attack (TIA), and cerebral infarction without residual deficits: Secondary | ICD-10-CM | POA: Diagnosis not present

## 2016-08-12 DIAGNOSIS — E785 Hyperlipidemia, unspecified: Secondary | ICD-10-CM | POA: Diagnosis not present

## 2016-08-12 DIAGNOSIS — J449 Chronic obstructive pulmonary disease, unspecified: Secondary | ICD-10-CM | POA: Diagnosis not present

## 2016-08-12 DIAGNOSIS — E119 Type 2 diabetes mellitus without complications: Secondary | ICD-10-CM | POA: Diagnosis not present

## 2016-08-12 DIAGNOSIS — R918 Other nonspecific abnormal finding of lung field: Secondary | ICD-10-CM | POA: Diagnosis not present

## 2016-08-12 DIAGNOSIS — Z87891 Personal history of nicotine dependence: Secondary | ICD-10-CM | POA: Diagnosis not present

## 2016-08-13 ENCOUNTER — Ambulatory Visit
Admission: RE | Admit: 2016-08-13 | Discharge: 2016-08-13 | Disposition: A | Discharge status: Home / Self Care | Payer: Medicare Other | Source: Ambulatory Visit | Attending: Radiation Oncology | Admitting: Radiation Oncology

## 2016-08-13 DIAGNOSIS — I4891 Unspecified atrial fibrillation: Secondary | ICD-10-CM | POA: Diagnosis not present

## 2016-08-13 DIAGNOSIS — C801 Malignant (primary) neoplasm, unspecified: Secondary | ICD-10-CM | POA: Diagnosis not present

## 2016-08-13 DIAGNOSIS — I482 Chronic atrial fibrillation: Secondary | ICD-10-CM | POA: Diagnosis not present

## 2016-08-13 DIAGNOSIS — E860 Dehydration: Secondary | ICD-10-CM | POA: Diagnosis not present

## 2016-08-13 DIAGNOSIS — E871 Hypo-osmolality and hyponatremia: Secondary | ICD-10-CM | POA: Diagnosis not present

## 2016-08-13 DIAGNOSIS — C7989 Secondary malignant neoplasm of other specified sites: Secondary | ICD-10-CM | POA: Diagnosis not present

## 2016-08-13 DIAGNOSIS — D62 Acute posthemorrhagic anemia: Secondary | ICD-10-CM | POA: Diagnosis not present

## 2016-08-13 DIAGNOSIS — J069 Acute upper respiratory infection, unspecified: Secondary | ICD-10-CM | POA: Diagnosis not present

## 2016-08-13 DIAGNOSIS — I10 Essential (primary) hypertension: Secondary | ICD-10-CM | POA: Diagnosis not present

## 2016-08-13 DIAGNOSIS — J449 Chronic obstructive pulmonary disease, unspecified: Secondary | ICD-10-CM | POA: Diagnosis not present

## 2016-08-13 DIAGNOSIS — Z510 Encounter for antineoplastic radiation therapy: Secondary | ICD-10-CM | POA: Diagnosis not present

## 2016-08-13 DIAGNOSIS — R918 Other nonspecific abnormal finding of lung field: Secondary | ICD-10-CM | POA: Diagnosis not present

## 2016-08-13 DIAGNOSIS — E119 Type 2 diabetes mellitus without complications: Secondary | ICD-10-CM | POA: Diagnosis not present

## 2016-08-13 DIAGNOSIS — Z7901 Long term (current) use of anticoagulants: Secondary | ICD-10-CM | POA: Diagnosis not present

## 2016-08-14 NOTE — Discharge Summary (Signed)
Monica [name], is a 82 y.o. female  DOB 05/06/1926  MRN [id number].  Admission date:  08/08/2016  Admitting Physician  Baxter Hire, MD  Discharge Date:  08/11/2016   Primary MD  Allyne Gee, MD  Recommendations for primary care physician for things to follow:   Follow-up with primary doctor in 1 week   Admission Diagnosis  Acute hyponatremia [E87.1] Rectal bleeding [K62.5] Acute GI bleeding [K92.2] Anemia due to blood loss, acute [D62] Supratherapeutic INR [R79.1]   Discharge Diagnosis  Acute hyponatremia [E87.1] Rectal bleeding [K62.5] Acute GI bleeding [K92.2] Anemia due to blood loss, acute [D62] Supratherapeutic INR [R79.1]    Active Problems:   GI bleed   Rectal bleeding   Anemia due to blood loss, acute      Past Medical History:  Diagnosis Date  . Atrial fibrillation ([place])   . Bronchitis   . Cancer ([place])    Left leg growth, kidneys, lungs and breasts  . Carcinoma of unknown primary ([place])   . COPD (chronic obstructive pulmonary disease) ([place])   . Diabetes mellitus without complication ([place])   . Hyperlipidemia   . Hypertension   . Pancreatitis   . Pneumonia   . Stroke ([place])   . Vitamin B12 deficiency     Past Surgical History:  Procedure Laterality Date  . ABDOMINAL HYSTERECTOMY  1975  . BLADDER SUSPENSION  1989  . COLONOSCOPY         History of present illness and  Hospital Course:     Kindly see H&P for history of present illness and admission details, please review complete Labs, Consult reports and Test reports for all details in brief  HPI  from the history and physical done on the day of admission8 year old female patient admitted because of bright red blood from rectum. And also felt lightheaded. hemoglobin 8.9 on admission. It was 12.310 days ago.  Hospital Course  #1  acute blood loss anemia secondary to gastrointestinal bleeding: Admitted to hospitalist service, transfused 1 unit of packed RBC.  Did Not have any further rectal bleeding in the hospital. Patient has history of hemorrhoids. And also had history of diverticular attacks before. Seen by gastroenterology, patient did not want any colonoscopy due to her age. Patient remained stable throughout the hospitalization. INR came down to 1.  Hb Stayed stable at 9.4 after 1 unit of transfusion. So we discharged the patient home. #2 hypercoagulable state with INR more than 4 on admission. Patient's Coumadin was stopped, transfused, advised not to continue Coumadin at discharge. Patient can follow up with PCP regarding restarting the Coumadin after rechecking the hemoglobin, INR. #3/coli UTI: Patient is given Keflex prescription. #4 history of COPD: Patient continued on home medications including theophylline    Discharge Condition: stable   Follow UP  Follow-up Information    KHAN,SAADAT A, MD On 08/13/2016.   Specialty:  Internal Medicine Why:  Appointment is at 10:45 Contact information: [place] [address] [phone]        [name], MD On 09/09/2016.   Specialty:  Gastroenterology Why:  Appointment is at 3:45 Contact information: [place]  [place] [address] [phone]             Discharge Instructions  and  Discharge Medications        Medication List    STOP taking these medications   warfarin 2 MG tablet Commonly known as:  COUMADIN   warfarin 4 MG tablet Commonly known  as:  COUMADIN     TAKE these medications   albuterol 108 (90 Base) MCG/ACT inhaler Commonly known as:  PROVENTIL HFA;VENTOLIN HFA Inhale 1 puff into the lungs every 4 (four) hours as needed for wheezing or shortness of breath.   amLODipine-benazepril 5-20 MG capsule Commonly known as:  LOTREL TAKE 1 CAPSULE BY MOUTH EVERY DAY   ANORO ELLIPTA 62.5-25  MCG/INH Aepb Generic drug:  umeclidinium-vilanterol   b complex vitamins capsule Take 1 capsule by mouth daily.   cephALEXin 500 MG capsule Commonly known as:  KEFLEX Take 1 capsule (500 mg total) by mouth every 12 (twelve) hours.   famotidine 20 MG tablet Commonly known as:  PEPCID Take 1 tablet (20 mg total) by mouth 2 (two) times daily.   fluticasone furoate-vilanterol 100-25 MCG/INH Aepb Commonly known as:  BREO ELLIPTA Inhale into the lungs.   gabapentin 300 MG capsule Commonly known as:  NEURONTIN TAKE 2 CAPSULES BY MOUTH AT BEDTIME   glipiZIDE 2.5 MG 24 hr tablet Commonly known as:  GLIPIZIDE XL Take 1 tablet (2.5 mg total) by mouth daily. PATIENT NEEDS TO SCHEDULE OFFICE VISIT FOR FOLLOW UP   hydrochlorothiazide 25 MG tablet Commonly known as:  HYDRODIURIL Take 25 mg by mouth daily.   metFORMIN 500 MG tablet Commonly known as:  GLUCOPHAGE TAKE ONE TABLET BY MOUTH TWICE DAILY   montelukast 10 MG tablet Commonly known as:  SINGULAIR Take 10 mg by mouth at bedtime.   oxybutynin 10 MG 24 hr tablet Commonly known as:  DITROPAN-XL TAKE ONE TABLET EVERY DAY   pravastatin 40 MG tablet Commonly known as:  PRAVACHOL Take 40 mg by mouth daily.   theophylline 100 MG 24 hr capsule Commonly known as:  THEO-24 Take 100 mg by mouth daily.         Diet and Activity recommendation: See Discharge Instructions above   Consults obtained - GI   Major procedures and Radiology Reports - PLEASE review detailed and final reports for all details, in brief -     Nm Pet Image Initial (pi) Whole Body  Result Date: 07/26/2016 CLINICAL DATA:  Initial treatment strategy for high grade sarcoma vs metastatic carcinoma of the left thigh. EXAM: NUCLEAR MEDICINE PET WHOLE BODY TECHNIQUE: 13.59 mCi F-18 FDG was injected intravenously. Full-ring PET imaging was performed from the vertex to the feet after the radiotracer. CT data was obtained and used for attenuation correction and  anatomic localization. FASTING BLOOD GLUCOSE:  Value:  137 mg/dl COMPARISON:  None. FINDINGS: Head/Neck: No hypermetabolic lymph nodes in the neck. Chest: No hypermetabolic mediastinal or hilar nodes. Central right upper lobe pulmonary nodule measures 9 mm and has an SUV max equal to 5.3. Nodule within the medial left upper lobe measures 5 mm and has an SUV max equal to 2.04. Index right apical nodule measures 6 mm and has an SUV max equal to 1.6. Abdomen/Pelvis: No abnormal hypermetabolic activity within the liver, pancreas, adrenal glands, or spleen. No hypermetabolic nodes identified within the abdomen. Hypermetabolic left external iliac node measures 1.4 cm and has an SUV max equal to 7.27. Slightly more proximal there is a hypermetabolic left external iliac node which measures 7 mm and has an SUV max equal to 4.06. Skeleton: No focal hypermetabolic activity to suggest skeletal metastasis. Extremities: The mass within the medial left 5 measures 5.9 x 6.9 by 8.9 cm. SUV max equals 25.4, image 297 of series 3. Slightly more proximal within the ventral left upper thigh are several  hypermetabolic soft tissue attenuating nodules situated between muscle layers. Index soft tissue nodule measures 1.9 cm and has an SUV max equal to 11.5, image 272 of series 3. There appears to be intravascular extension of the tumor into the left femoral vein, image 13 within SUV max equal to 13.48. This extends proximally to the level of the left common femoral vein. IMPRESSION: 1. The left upper medial thigh mass is intensely hypermetabolic compatible with tumor. 2. There is evidence of hypermetabolic thrombus extending into the left common femoral vein. This appears to communicate with the left mass and is favored to represent tumor thrombus. Inflamed bland thrombus could conceivably also exhibit malignant range uptake. However, the degree of increased uptake is fairly intense and favors tumor. Consider further investigation with  lower extremity Doppler evaluation. 3. Hypermetabolic left external iliac lymph nodes compatible with metastatic at 4. Multifocal hypermetabolic pulmonary nodules within the right lung compatible with pulmonary metastasis. These results will be called to the ordering clinician or representative by the Radiologist Assistant, and communication documented in the PACS or zVision Dashboard. Electronically Signed   By: Kerby Moors M.D.   On: 07/26/2016 10:54    Micro Results    Recent Results (from the past 240 hour(s))  MRSA PCR Screening     Status: None   Collection Time: 08/08/16 11:44 AM  Result Value Ref Range Status   MRSA by PCR NEGATIVE NEGATIVE Final    Comment:        The GeneXpert MRSA Assay (FDA approved for NASAL specimens only), is one component of a comprehensive MRSA colonization surveillance program. It is not intended to diagnose MRSA infection nor to guide or monitor treatment for MRSA infections.   Urine culture     Status: Abnormal   Collection Time: 08/08/16  2:15 PM  Result Value Ref Range Status   Specimen Description URINE, RANDOM  Final   Special Requests NONE  Final   Culture >=100,000 COLONIES/mL ESCHERICHIA COLI (A)  Final   Report Status 08/11/2016 FINAL  Final   Organism ID, Bacteria ESCHERICHIA COLI (A)  Final      Susceptibility   Escherichia coli - MIC*    AMPICILLIN <=2 SENSITIVE Sensitive     CEFAZOLIN <=4 SENSITIVE Sensitive     CEFTRIAXONE <=1 SENSITIVE Sensitive     CIPROFLOXACIN <=0.25 SENSITIVE Sensitive     GENTAMICIN <=1 SENSITIVE Sensitive     IMIPENEM <=0.25 SENSITIVE Sensitive     NITROFURANTOIN <=16 SENSITIVE Sensitive     TRIMETH/SULFA <=20 SENSITIVE Sensitive     AMPICILLIN/SULBACTAM <=2 SENSITIVE Sensitive     PIP/TAZO <=4 SENSITIVE Sensitive     Extended ESBL NEGATIVE Sensitive     * >=100,000 COLONIES/mL ESCHERICHIA COLI       Today   Subjective:   A[place] today has no headache,no chest abdominal pain,no new  weakness tingling or numbness, feels much better wants to go home today.  Objective:   Blood pressure 129/72, pulse 85, temperature 97.7 F (36.5 C), temperature source Oral, resp. rate 18, height 5\' 4"  (1.626 m), weight 71.1 kg (156 lb 12 oz), SpO2 98 %.  No intake or output data in the 24 hours ending 08/14/16 1308  Exam Awake Alert, Oriented x 3, No new F.N deficits, Normal affect [place].AT,PERRAL Supple Neck,No JVD, No cervical lymphadenopathy appriciated.  Symmetrical Chest wall movement, Good air movement bilaterally, CTAB RRR,No Gallops,Rubs or new Murmurs, No Parasternal Heave +ve B.Sounds, Abd Soft, Non tender, No organomegaly appriciated, No  rebound -guarding or rigidity. No Cyanosis, Clubbing or edema, No new Rash or bruise  Data Review   CBC w Diff:  Lab Results  Component Value Date   WBC 7.1 08/10/2016   HGB 11.7 (L) 08/11/2016   HGB 12.5 03/29/2014   HCT 33.0 (L) 08/11/2016   HCT 38.3 03/29/2014   PLT 223 08/10/2016   PLT 258 03/29/2014   LYMPHOPCT 26 08/02/2016   LYMPHOPCT 10.2 03/29/2014   MONOPCT 8 08/02/2016   MONOPCT 4.8 03/29/2014   EOSPCT 3 08/02/2016   EOSPCT 0.1 03/29/2014   BASOPCT 1 08/02/2016   BASOPCT 0.2 03/29/2014    CMP:  Lab Results  Component Value Date   NA 137 08/10/2016   NA 136 03/29/2014   K 3.6 08/10/2016   K 4.0 03/29/2014   CL 108 08/10/2016   CL 101 03/29/2014   CO2 26 08/10/2016   CO2 27 03/29/2014   BUN 9 08/10/2016   BUN 28 (H) 03/29/2014   CREATININE 0.74 08/10/2016   CREATININE 1.29 03/29/2014   PROT 6.1 (L) 08/08/2016   PROT 7.6 12/12/2013   ALBUMIN 3.3 (L) 08/08/2016   ALBUMIN 3.3 (L) 12/12/2013   BILITOT 0.1 (L) 08/08/2016   BILITOT 0.4 12/12/2013   ALKPHOS 66 08/08/2016   ALKPHOS 71 12/12/2013   AST 22 08/08/2016   AST 23 12/12/2013   ALT 14 08/08/2016   ALT 22 12/12/2013  .   Total Time in preparing paper work, data evaluation and todays exam - 78 minutes  Allanna Bresee M.D on 08/11/2016 at  1:08 PM    Note: This dictation was prepared with Dragon dictation along with smaller phrase technology. Any transcriptional errors that result from this process are unintentional.

## 2016-08-16 ENCOUNTER — Ambulatory Visit
Admission: RE | Admit: 2016-08-16 | Discharge: 2016-08-16 | Disposition: A | Discharge status: Home / Self Care | Payer: Medicare Other | Source: Ambulatory Visit | Attending: Radiation Oncology | Admitting: Radiation Oncology

## 2016-08-16 ENCOUNTER — Encounter: Payer: Self-pay | Admitting: Internal Medicine

## 2016-08-16 ENCOUNTER — Inpatient Hospital Stay: Payer: Medicare Other | Attending: Internal Medicine | Admitting: Internal Medicine

## 2016-08-16 VITALS — BP 142/78 | HR 105 | Temp 99.1°F | Resp 19 | Ht 64.0 in | Wt 159.6 lb

## 2016-08-16 DIAGNOSIS — C787 Secondary malignant neoplasm of liver and intrahepatic bile duct: Secondary | ICD-10-CM | POA: Diagnosis not present

## 2016-08-16 DIAGNOSIS — C801 Malignant (primary) neoplasm, unspecified: Secondary | ICD-10-CM | POA: Diagnosis not present

## 2016-08-16 DIAGNOSIS — Z8051 Family history of malignant neoplasm of kidney: Secondary | ICD-10-CM | POA: Insufficient documentation

## 2016-08-16 DIAGNOSIS — R918 Other nonspecific abnormal finding of lung field: Secondary | ICD-10-CM | POA: Diagnosis not present

## 2016-08-16 DIAGNOSIS — E538 Deficiency of other specified B group vitamins: Secondary | ICD-10-CM | POA: Diagnosis not present

## 2016-08-16 DIAGNOSIS — I10 Essential (primary) hypertension: Secondary | ICD-10-CM | POA: Insufficient documentation

## 2016-08-16 DIAGNOSIS — Z510 Encounter for antineoplastic radiation therapy: Secondary | ICD-10-CM | POA: Diagnosis not present

## 2016-08-16 DIAGNOSIS — Z8701 Personal history of pneumonia (recurrent): Secondary | ICD-10-CM | POA: Insufficient documentation

## 2016-08-16 DIAGNOSIS — C7652 Malignant neoplasm of left lower limb: Secondary | ICD-10-CM | POA: Diagnosis not present

## 2016-08-16 DIAGNOSIS — J449 Chronic obstructive pulmonary disease, unspecified: Secondary | ICD-10-CM | POA: Insufficient documentation

## 2016-08-16 DIAGNOSIS — C7800 Secondary malignant neoplasm of unspecified lung: Secondary | ICD-10-CM | POA: Insufficient documentation

## 2016-08-16 DIAGNOSIS — E119 Type 2 diabetes mellitus without complications: Secondary | ICD-10-CM | POA: Insufficient documentation

## 2016-08-16 DIAGNOSIS — Z79899 Other long term (current) drug therapy: Secondary | ICD-10-CM | POA: Insufficient documentation

## 2016-08-16 DIAGNOSIS — C7989 Secondary malignant neoplasm of other specified sites: Secondary | ICD-10-CM | POA: Diagnosis not present

## 2016-08-16 DIAGNOSIS — I4891 Unspecified atrial fibrillation: Secondary | ICD-10-CM | POA: Insufficient documentation

## 2016-08-16 DIAGNOSIS — C779 Secondary and unspecified malignant neoplasm of lymph node, unspecified: Secondary | ICD-10-CM | POA: Insufficient documentation

## 2016-08-16 DIAGNOSIS — Z923 Personal history of irradiation: Secondary | ICD-10-CM | POA: Insufficient documentation

## 2016-08-16 DIAGNOSIS — Z87891 Personal history of nicotine dependence: Secondary | ICD-10-CM | POA: Diagnosis not present

## 2016-08-16 DIAGNOSIS — Z8673 Personal history of transient ischemic attack (TIA), and cerebral infarction without residual deficits: Secondary | ICD-10-CM | POA: Insufficient documentation

## 2016-08-16 DIAGNOSIS — K922 Gastrointestinal hemorrhage, unspecified: Secondary | ICD-10-CM | POA: Diagnosis not present

## 2016-08-16 DIAGNOSIS — E785 Hyperlipidemia, unspecified: Secondary | ICD-10-CM

## 2016-08-16 DIAGNOSIS — Z7984 Long term (current) use of oral hypoglycemic drugs: Secondary | ICD-10-CM | POA: Insufficient documentation

## 2016-08-16 DIAGNOSIS — Z7901 Long term (current) use of anticoagulants: Secondary | ICD-10-CM | POA: Insufficient documentation

## 2016-08-16 NOTE — Assessment & Plan Note (Signed)
Left thigh mass- along with left pelvic adenopathy; and bilateral lung nodules- biopsy high-grade sarcoma [more likely] versus carcinoma [less likely].discussed at tumor conference- ordered [facility] one for any actional targets/MMR. I do not think patient is a good candidate for chemotherapy  # On palliative RT to thigh [until oct 11th ]  # A/fib/ LLE DVT- started back on coumadin by PCP.  # GI bleed- on Coumadin- re-started back on coumadin by PCP.    # Will call the patient and daughter with updates, in the interim if any Ph:  [phone]/ home; [place] Handy/ daughter.   # follow up in week of 23rd October/labs. Discussed with daughter- prognosis is usually months; however a follow-up CT scan might give [place] more information.

## 2016-08-16 NOTE — Progress Notes (Signed)
Recently admitted rectal bleeding, Pt complains of SOB on rest and exertion, Pt wants to know POC.

## 2016-08-16 NOTE — Progress Notes (Signed)
[facility] NOTE  Patient Care Team: Allyne Gee, MD as PCP - General (Internal Medicine) Seeplaputhur Robinette Haines, MD (General Surgery) [name], NP as Nurse Practitioner (Family Medicine) Lavera Guise, MD (Internal Medicine)  CHIEF COMPLAINTS/PURPOSE OF CONSULTATION: Malignancy of the thigh.   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 PET- Avid thigh mass/? Tumor thrombus; Bil avid lung nodules; Left pelvic LN  # Hx of lung nodules [Dr.Khan]   # A.fib on coumadin; ? LLE DVT [? 6-7 years ago]; Smoker [quit >20 y]     Carcinoma of unknown primary ([place])   08/02/2016 Initial Diagnosis    Carcinoma of unknown primary ([place])      HISTORY OF PRESENTING ILLNESS:  Monica [name] 82 y.o.  female noted to have a mass in the left medial thigh- With pelvic adenopathy and bilateral lung nodules- likely sarcoma currently on radiation is set for follow-up.  In the interim patient was admitted to the hospital for GI bleed- possible diverticular; her INR was 4 at the time. Status post 3 years of PRBC transfusion; hemoglobin stable around 11. She was restarted back on Coumadin by PCP.  Currently denies any blood in stools. Denies any significant pain.   ROS: A complete 10 point review of system is done which is negative except mentioned above in history of present illness  MEDICAL HISTORY:  Past Medical History:  Diagnosis Date  . Atrial fibrillation ([place])   . Bronchitis   . Cancer ([place])    Left leg growth, kidneys, lungs and breasts  . Carcinoma of unknown primary ([place])   . COPD (chronic obstructive pulmonary disease) ([place])   . Diabetes mellitus without complication ([place])   . Hyperlipidemia   . Hypertension   . Pancreatitis   . Pneumonia   . Stroke ([place])   . Vitamin B12 deficiency     SURGICAL HISTORY: Past Surgical History:  Procedure Laterality Date  . ABDOMINAL  HYSTERECTOMY  1975  . BLADDER SUSPENSION  1989  . 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 She walks with cane and a walker. Social History   Social History  . Marital status: Divorced    Spouse name: N/A  . Number of children: N/A  . Years of education: N/A   Occupational History  . Not on file.   Social History Main Topics  . Smoking status: Former Smoker    Packs/day: 0.50    Years: 15.00    Types: Cigarettes    Quit date: 11/15/1988  . Smokeless tobacco: Never Used  . Alcohol use No  . Drug use: No  . Sexual activity: Not on file     Comment: hysterectomy    Other Topics Concern  . Not on file   Social History Narrative  . No narrative on file    FAMILY HISTORY: brother- colon-70s.. Sister- kidney cancer 10s.  History reviewed. No pertinent family history.  ALLERGIES:  is allergic to sulfa antibiotics; celecoxib; acetaminophen; codeine; penicillin g; and petrolatum-zinc oxide.  MEDICATIONS:  Current Outpatient Prescriptions  Medication Sig Dispense Refill  . albuterol (PROVENTIL HFA;VENTOLIN HFA) 108 (90 Base) MCG/ACT inhaler Inhale 1 puff into the lungs every 4 (four) hours as needed for wheezing or shortness of breath.    Marland Kitchen amLODipine-benazepril (LOTREL) 5-20 MG  capsule TAKE 1 CAPSULE BY MOUTH EVERY DAY 90 capsule 0  . b complex vitamins capsule Take 1 capsule by mouth daily.    . cephALEXin (KEFLEX) 500 MG capsule Take 1 capsule (500 mg total) by mouth every 12 (twelve) hours. 14 capsule 0  . famotidine (PEPCID) 20 MG tablet Take 1 tablet (20 mg total) by mouth 2 (two) times daily. 30 tablet 0  . Fluticasone Furoate-Vilanterol 100-25 MCG/INH AEPB Inhale into the lungs.    . gabapentin (NEURONTIN) 300 MG capsule TAKE 2 CAPSULES BY MOUTH AT BEDTIME 60 capsule 5  . glipiZIDE (GLIPIZIDE XL) 2.5 MG 24 hr tablet Take 1 tablet (2.5 mg total) by mouth daily. PATIENT NEEDS TO SCHEDULE  OFFICE VISIT FOR FOLLOW UP 90 tablet 0  . hydrochlorothiazide (HYDRODIURIL) 25 MG tablet Take 25 mg by mouth daily.    . metFORMIN (GLUCOPHAGE) 500 MG tablet TAKE ONE TABLET BY MOUTH TWICE DAILY 180 tablet 0  . montelukast (SINGULAIR) 10 MG tablet Take 10 mg by mouth at bedtime.    Marland Kitchen oxybutynin (DITROPAN-XL) 10 MG 24 hr tablet TAKE ONE TABLET EVERY DAY 90 tablet 3  . pravastatin (PRAVACHOL) 40 MG tablet Take 40 mg by mouth daily.    . theophylline (THEO-24) 100 MG 24 hr capsule Take 100 mg by mouth daily.    Marland Kitchen Umeclidinium-Vilanterol (ANORO ELLIPTA) 62.5-25 MCG/INH AEPB     . furosemide (LASIX) 20 MG tablet     . warfarin (COUMADIN) 4 MG tablet 2 mg.      No current facility-administered medications for this visit.       Marland Kitchen  PHYSICAL EXAMINATION: ECOG PERFORMANCE STATUS: 1 - Symptomatic but completely ambulatory  Vitals:   08/16/16 1600  BP: (!) 142/78  Pulse: (!) 105  Resp: 19  Temp: 99.1 F (37.3 C)   Filed Weights   08/16/16 1600  Weight: 159 lb 9.6 oz (72.4 kg)    GENERAL: Well-nourished well-developed; Alert, no distress and comfortable.   In a wheelchair. Accompanied by 3 daughters. EYES: no pallor or icterus OROPHARYNX: no thrush or ulceration; good dentition  NECK: supple, no masses felt LYMPH:  no palpable lymphadenopathy in the cervical, axillary or inguinal regions LUNGS: clear to auscultation and  No wheeze or crackles HEART/CVS: regular rate & rhythm and no murmurs; No lower extremity edema; left medial thigh 6-7 cm mass noted ABDOMEN: abdomen soft, non-tender and normal bowel sounds Musculoskeletal:no cyanosis of digits and no clubbing  PSYCH: alert & oriented x 3 with fluent speech NEURO: no focal motor/sensory deficits SKIN:  no rashes or significant lesions  LABORATORY DATA:  I have reviewed the data as listed Lab Results  Component Value Date   WBC 7.1 08/10/2016   HGB 11.7 (L) 08/11/2016   HCT 33.0 (L) 08/11/2016   MCV 81.1 08/10/2016   PLT 223  08/10/2016    Recent Labs  08/02/16 1642 08/08/16 0706 08/09/16 0419 08/10/16 0422  NA 128* 129* 137 137  K 3.9 4.5 3.9 3.6  CL 94* 96* 107 108  CO2 _0 GLUCOSE 81 166* 158* 134*  BUN _1 CREATININE 0.80 0.88 0.74 0.74  CALCIUM 9.2 8.7* 8.4* 8.3*  GFRNONAA >60 57* >60 >60  GFRAA >60 >60 >60 >60  PROT 7.5 6.1*  --   --   ALBUMIN 4.3 3.3*  --   --   AST 26 22  --   --   ALT 18 14  --   --  ALKPHOS 86 66  --   --   BILITOT 0.3 0.1*  --   --     RADIOGRAPHIC STUDIES: I have personally reviewed the radiological images as listed and agreed with the findings in the report. Nm Pet Image Initial (pi) Whole Body  Result Date: 07/26/2016 CLINICAL DATA:  Initial treatment strategy for high grade sarcoma vs metastatic carcinoma of the left thigh. EXAM: NUCLEAR MEDICINE PET WHOLE BODY TECHNIQUE: 13.59 mCi F-18 FDG was injected intravenously. Full-ring PET imaging was performed from the vertex to the feet after the radiotracer. CT data was obtained and used for attenuation correction and anatomic localization. FASTING BLOOD GLUCOSE:  Value:  137 mg/dl COMPARISON:  None. FINDINGS: Head/Neck: No hypermetabolic lymph nodes in the neck. Chest: No hypermetabolic mediastinal or hilar nodes. Central right upper lobe pulmonary nodule measures 9 mm and has an SUV max equal to 5.3. Nodule within the medial left upper lobe measures 5 mm and has an SUV max equal to 2.04. Index right apical nodule measures 6 mm and has an SUV max equal to 1.6. Abdomen/Pelvis: No abnormal hypermetabolic activity within the liver, pancreas, adrenal glands, or spleen. No hypermetabolic nodes identified within the abdomen. Hypermetabolic left external iliac node measures 1.4 cm and has an SUV max equal to 7.27. Slightly more proximal there is a hypermetabolic left external iliac node which measures 7 mm and has an SUV max equal to 4.06. Skeleton: No focal hypermetabolic activity to suggest skeletal metastasis.  Extremities: The mass within the medial left 5 measures 5.9 x 6.9 by 8.9 cm. SUV max equals 25.4, image 297 of series 3. Slightly more proximal within the ventral left upper thigh are several hypermetabolic soft tissue attenuating nodules situated between muscle layers. Index soft tissue nodule measures 1.9 cm and has an SUV max equal to 11.5, image 272 of series 3. There appears to be intravascular extension of the tumor into the left femoral vein, image 13 within SUV max equal to 13.48. This extends proximally to the level of the left common femoral vein. IMPRESSION: 1. The left upper medial thigh mass is intensely hypermetabolic compatible with tumor. 2. There is evidence of hypermetabolic thrombus extending into the left common femoral vein. This appears to communicate with the left mass and is favored to represent tumor thrombus. Inflamed bland thrombus could conceivably also exhibit malignant range uptake. However, the degree of increased uptake is fairly intense and favors tumor. Consider further investigation with lower extremity Doppler evaluation. 3. Hypermetabolic left external iliac lymph nodes compatible with metastatic at 4. Multifocal hypermetabolic pulmonary nodules within the right lung compatible with pulmonary metastasis. These results will be called to the ordering clinician or representative by the Radiologist Assistant, and communication documented in the PACS or zVision Dashboard. Electronically Signed   By: Kerby Moors M.D.   On: 07/26/2016 10:54    ASSESSMENT & PLAN:   Carcinoma of unknown primary ([place]) Left thigh mass- along with left pelvic adenopathy; and bilateral lung nodules- biopsy high-grade sarcoma [more likely] versus carcinoma [less likely].discussed at tumor conference- ordered [facility] one for any actional targets/MMR. I do not think patient is a good candidate for chemotherapy  # On palliative RT to thigh [until oct 11th ]  # A/fib/ LLE DVT- started back on  coumadin by PCP.  # GI bleed- on Coumadin- re-started back on coumadin by PCP.    # Will call the patient and daughter with updates, in the interim if any Ph:  [phone]/ home;  [place] Handy/ daughter.   # follow up in week of 23rd October/labs. Discussed with daughter- prognosis is usually months; however a follow-up CT scan might give [place] more information.     Cammie Sickle, MD 08/16/2016 4:39 PM

## 2016-08-17 ENCOUNTER — Ambulatory Visit
Admission: RE | Admit: 2016-08-17 | Discharge: 2016-08-17 | Disposition: A | Discharge status: Home / Self Care | Payer: Medicare Other | Source: Ambulatory Visit | Attending: Radiation Oncology | Admitting: Radiation Oncology

## 2016-08-17 DIAGNOSIS — C801 Malignant (primary) neoplasm, unspecified: Secondary | ICD-10-CM | POA: Diagnosis not present

## 2016-08-17 DIAGNOSIS — C7989 Secondary malignant neoplasm of other specified sites: Secondary | ICD-10-CM | POA: Diagnosis not present

## 2016-08-17 DIAGNOSIS — R918 Other nonspecific abnormal finding of lung field: Secondary | ICD-10-CM | POA: Diagnosis not present

## 2016-08-17 DIAGNOSIS — J449 Chronic obstructive pulmonary disease, unspecified: Secondary | ICD-10-CM | POA: Diagnosis not present

## 2016-08-17 DIAGNOSIS — Z510 Encounter for antineoplastic radiation therapy: Secondary | ICD-10-CM | POA: Diagnosis not present

## 2016-08-17 DIAGNOSIS — I4891 Unspecified atrial fibrillation: Secondary | ICD-10-CM | POA: Diagnosis not present

## 2016-08-18 ENCOUNTER — Ambulatory Visit
Admission: RE | Admit: 2016-08-18 | Discharge: 2016-08-18 | Disposition: A | Discharge status: Home / Self Care | Payer: Medicare Other | Source: Ambulatory Visit | Attending: Radiation Oncology | Admitting: Radiation Oncology

## 2016-08-18 ENCOUNTER — Inpatient Hospital Stay: Payer: Medicare Other

## 2016-08-18 DIAGNOSIS — C4922 Malignant neoplasm of connective and soft tissue of left lower limb, including hip: Secondary | ICD-10-CM | POA: Diagnosis not present

## 2016-08-18 DIAGNOSIS — R918 Other nonspecific abnormal finding of lung field: Secondary | ICD-10-CM | POA: Diagnosis not present

## 2016-08-18 DIAGNOSIS — C779 Secondary and unspecified malignant neoplasm of lymph node, unspecified: Secondary | ICD-10-CM | POA: Diagnosis not present

## 2016-08-18 DIAGNOSIS — C801 Malignant (primary) neoplasm, unspecified: Secondary | ICD-10-CM | POA: Diagnosis not present

## 2016-08-18 DIAGNOSIS — C7800 Secondary malignant neoplasm of unspecified lung: Secondary | ICD-10-CM | POA: Diagnosis not present

## 2016-08-18 DIAGNOSIS — Z510 Encounter for antineoplastic radiation therapy: Secondary | ICD-10-CM | POA: Diagnosis not present

## 2016-08-18 DIAGNOSIS — K922 Gastrointestinal hemorrhage, unspecified: Secondary | ICD-10-CM | POA: Diagnosis not present

## 2016-08-18 DIAGNOSIS — J449 Chronic obstructive pulmonary disease, unspecified: Secondary | ICD-10-CM | POA: Diagnosis not present

## 2016-08-18 DIAGNOSIS — I4891 Unspecified atrial fibrillation: Secondary | ICD-10-CM | POA: Diagnosis not present

## 2016-08-18 DIAGNOSIS — C7989 Secondary malignant neoplasm of other specified sites: Secondary | ICD-10-CM | POA: Diagnosis not present

## 2016-08-18 DIAGNOSIS — C7652 Malignant neoplasm of left lower limb: Secondary | ICD-10-CM | POA: Diagnosis not present

## 2016-08-18 DIAGNOSIS — C787 Secondary malignant neoplasm of liver and intrahepatic bile duct: Secondary | ICD-10-CM | POA: Diagnosis not present

## 2016-08-18 LAB — CBC
HEMATOCRIT: 33.6 % — AB (ref 35.0–47.0)
HEMOGLOBIN: 11.4 g/dL — AB (ref 12.0–16.0)
MCH: 27.5 pg (ref 26.0–34.0)
MCHC: 33.9 g/dL (ref 32.0–36.0)
MCV: 81.1 fL (ref 80.0–100.0)
Platelets: 289 10*3/uL (ref 150–440)
RBC: 4.14 MIL/uL (ref 3.80–5.20)
RDW: 14.8 % — ABNORMAL HIGH (ref 11.5–14.5)
WBC: 6.6 10*3/uL (ref 3.6–11.0)

## 2016-08-19 ENCOUNTER — Ambulatory Visit
Admission: RE | Admit: 2016-08-19 | Discharge: 2016-08-19 | Disposition: A | Discharge status: Home / Self Care | Payer: Medicare Other | Source: Ambulatory Visit | Attending: Radiation Oncology | Admitting: Radiation Oncology

## 2016-08-19 DIAGNOSIS — D62 Acute posthemorrhagic anemia: Secondary | ICD-10-CM | POA: Diagnosis not present

## 2016-08-19 DIAGNOSIS — Z7901 Long term (current) use of anticoagulants: Secondary | ICD-10-CM | POA: Diagnosis not present

## 2016-08-19 DIAGNOSIS — J069 Acute upper respiratory infection, unspecified: Secondary | ICD-10-CM | POA: Diagnosis not present

## 2016-08-19 DIAGNOSIS — I482 Chronic atrial fibrillation: Secondary | ICD-10-CM | POA: Diagnosis not present

## 2016-08-19 DIAGNOSIS — K5900 Constipation, unspecified: Secondary | ICD-10-CM | POA: Diagnosis not present

## 2016-08-19 DIAGNOSIS — Z510 Encounter for antineoplastic radiation therapy: Secondary | ICD-10-CM | POA: Diagnosis not present

## 2016-08-19 DIAGNOSIS — C7989 Secondary malignant neoplasm of other specified sites: Secondary | ICD-10-CM | POA: Diagnosis not present

## 2016-08-19 DIAGNOSIS — R918 Other nonspecific abnormal finding of lung field: Secondary | ICD-10-CM | POA: Diagnosis not present

## 2016-08-19 DIAGNOSIS — C801 Malignant (primary) neoplasm, unspecified: Secondary | ICD-10-CM | POA: Diagnosis not present

## 2016-08-19 DIAGNOSIS — J449 Chronic obstructive pulmonary disease, unspecified: Secondary | ICD-10-CM | POA: Diagnosis not present

## 2016-08-19 DIAGNOSIS — I4891 Unspecified atrial fibrillation: Secondary | ICD-10-CM | POA: Diagnosis not present

## 2016-08-19 DIAGNOSIS — N390 Urinary tract infection, site not specified: Secondary | ICD-10-CM | POA: Diagnosis not present

## 2016-08-20 ENCOUNTER — Ambulatory Visit
Admission: RE | Admit: 2016-08-20 | Discharge: 2016-08-20 | Disposition: A | Discharge status: Home / Self Care | Payer: Medicare Other | Source: Ambulatory Visit | Attending: Radiation Oncology | Admitting: Radiation Oncology

## 2016-08-20 DIAGNOSIS — J449 Chronic obstructive pulmonary disease, unspecified: Secondary | ICD-10-CM | POA: Diagnosis not present

## 2016-08-20 DIAGNOSIS — I4891 Unspecified atrial fibrillation: Secondary | ICD-10-CM | POA: Diagnosis not present

## 2016-08-20 DIAGNOSIS — Z510 Encounter for antineoplastic radiation therapy: Secondary | ICD-10-CM | POA: Diagnosis not present

## 2016-08-20 DIAGNOSIS — R918 Other nonspecific abnormal finding of lung field: Secondary | ICD-10-CM | POA: Diagnosis not present

## 2016-08-20 DIAGNOSIS — C7989 Secondary malignant neoplasm of other specified sites: Secondary | ICD-10-CM | POA: Diagnosis not present

## 2016-08-20 DIAGNOSIS — C801 Malignant (primary) neoplasm, unspecified: Secondary | ICD-10-CM | POA: Diagnosis not present

## 2016-08-23 ENCOUNTER — Ambulatory Visit
Admission: RE | Admit: 2016-08-23 | Discharge: 2016-08-23 | Disposition: A | Discharge status: Home / Self Care | Payer: Medicare Other | Source: Ambulatory Visit | Attending: Radiation Oncology | Admitting: Radiation Oncology

## 2016-08-23 DIAGNOSIS — I4891 Unspecified atrial fibrillation: Secondary | ICD-10-CM | POA: Diagnosis not present

## 2016-08-23 DIAGNOSIS — C7989 Secondary malignant neoplasm of other specified sites: Secondary | ICD-10-CM | POA: Diagnosis not present

## 2016-08-23 DIAGNOSIS — J449 Chronic obstructive pulmonary disease, unspecified: Secondary | ICD-10-CM | POA: Diagnosis not present

## 2016-08-23 DIAGNOSIS — Z510 Encounter for antineoplastic radiation therapy: Secondary | ICD-10-CM | POA: Diagnosis not present

## 2016-08-23 DIAGNOSIS — C801 Malignant (primary) neoplasm, unspecified: Secondary | ICD-10-CM | POA: Diagnosis not present

## 2016-08-23 DIAGNOSIS — R918 Other nonspecific abnormal finding of lung field: Secondary | ICD-10-CM | POA: Diagnosis not present

## 2016-08-23 LAB — PREPARE RBC (CROSSMATCH)

## 2016-08-24 ENCOUNTER — Ambulatory Visit
Admission: RE | Admit: 2016-08-24 | Discharge: 2016-08-24 | Disposition: A | Discharge status: Home / Self Care | Payer: Medicare Other | Source: Ambulatory Visit | Attending: Radiation Oncology | Admitting: Radiation Oncology

## 2016-08-24 DIAGNOSIS — J449 Chronic obstructive pulmonary disease, unspecified: Secondary | ICD-10-CM | POA: Diagnosis not present

## 2016-08-24 DIAGNOSIS — C801 Malignant (primary) neoplasm, unspecified: Secondary | ICD-10-CM | POA: Diagnosis not present

## 2016-08-24 DIAGNOSIS — I4891 Unspecified atrial fibrillation: Secondary | ICD-10-CM | POA: Diagnosis not present

## 2016-08-24 DIAGNOSIS — R918 Other nonspecific abnormal finding of lung field: Secondary | ICD-10-CM | POA: Diagnosis not present

## 2016-08-24 DIAGNOSIS — Z510 Encounter for antineoplastic radiation therapy: Secondary | ICD-10-CM | POA: Diagnosis not present

## 2016-08-24 DIAGNOSIS — C7989 Secondary malignant neoplasm of other specified sites: Secondary | ICD-10-CM | POA: Diagnosis not present

## 2016-08-25 ENCOUNTER — Ambulatory Visit
Admission: RE | Admit: 2016-08-25 | Discharge: 2016-08-25 | Disposition: A | Discharge status: Home / Self Care | Payer: Medicare Other | Source: Ambulatory Visit | Attending: Radiation Oncology | Admitting: Radiation Oncology

## 2016-08-25 DIAGNOSIS — C7989 Secondary malignant neoplasm of other specified sites: Secondary | ICD-10-CM | POA: Diagnosis not present

## 2016-08-25 DIAGNOSIS — R918 Other nonspecific abnormal finding of lung field: Secondary | ICD-10-CM | POA: Diagnosis not present

## 2016-08-25 DIAGNOSIS — Z510 Encounter for antineoplastic radiation therapy: Secondary | ICD-10-CM | POA: Diagnosis not present

## 2016-08-25 DIAGNOSIS — C4922 Malignant neoplasm of connective and soft tissue of left lower limb, including hip: Secondary | ICD-10-CM | POA: Diagnosis not present

## 2016-08-25 DIAGNOSIS — C801 Malignant (primary) neoplasm, unspecified: Secondary | ICD-10-CM | POA: Diagnosis not present

## 2016-08-25 DIAGNOSIS — I4891 Unspecified atrial fibrillation: Secondary | ICD-10-CM | POA: Diagnosis not present

## 2016-08-25 DIAGNOSIS — J449 Chronic obstructive pulmonary disease, unspecified: Secondary | ICD-10-CM | POA: Diagnosis not present

## 2016-08-30 DIAGNOSIS — Z7901 Long term (current) use of anticoagulants: Secondary | ICD-10-CM | POA: Diagnosis not present

## 2016-09-06 ENCOUNTER — Inpatient Hospital Stay: Payer: Medicare Other

## 2016-09-06 ENCOUNTER — Inpatient Hospital Stay (HOSPITAL_BASED_OUTPATIENT_CLINIC_OR_DEPARTMENT_OTHER): Payer: Medicare Other | Admitting: Internal Medicine

## 2016-09-06 VITALS — BP 123/69 | HR 70 | Temp 97.0°F | Resp 18 | Wt 155.4 lb

## 2016-09-06 DIAGNOSIS — C779 Secondary and unspecified malignant neoplasm of lymph node, unspecified: Secondary | ICD-10-CM

## 2016-09-06 DIAGNOSIS — K922 Gastrointestinal hemorrhage, unspecified: Secondary | ICD-10-CM | POA: Diagnosis not present

## 2016-09-06 DIAGNOSIS — Z7901 Long term (current) use of anticoagulants: Secondary | ICD-10-CM

## 2016-09-06 DIAGNOSIS — I4891 Unspecified atrial fibrillation: Secondary | ICD-10-CM

## 2016-09-06 DIAGNOSIS — C801 Malignant (primary) neoplasm, unspecified: Secondary | ICD-10-CM

## 2016-09-06 DIAGNOSIS — E538 Deficiency of other specified B group vitamins: Secondary | ICD-10-CM

## 2016-09-06 DIAGNOSIS — C7800 Secondary malignant neoplasm of unspecified lung: Secondary | ICD-10-CM

## 2016-09-06 DIAGNOSIS — E785 Hyperlipidemia, unspecified: Secondary | ICD-10-CM

## 2016-09-06 DIAGNOSIS — Z923 Personal history of irradiation: Secondary | ICD-10-CM

## 2016-09-06 DIAGNOSIS — C787 Secondary malignant neoplasm of liver and intrahepatic bile duct: Secondary | ICD-10-CM

## 2016-09-06 DIAGNOSIS — Z87891 Personal history of nicotine dependence: Secondary | ICD-10-CM

## 2016-09-06 DIAGNOSIS — I10 Essential (primary) hypertension: Secondary | ICD-10-CM

## 2016-09-06 DIAGNOSIS — Z79899 Other long term (current) drug therapy: Secondary | ICD-10-CM

## 2016-09-06 DIAGNOSIS — E119 Type 2 diabetes mellitus without complications: Secondary | ICD-10-CM

## 2016-09-06 DIAGNOSIS — Z7984 Long term (current) use of oral hypoglycemic drugs: Secondary | ICD-10-CM

## 2016-09-06 DIAGNOSIS — C7652 Malignant neoplasm of left lower limb: Secondary | ICD-10-CM | POA: Diagnosis not present

## 2016-09-06 DIAGNOSIS — J449 Chronic obstructive pulmonary disease, unspecified: Secondary | ICD-10-CM

## 2016-09-06 DIAGNOSIS — Z8701 Personal history of pneumonia (recurrent): Secondary | ICD-10-CM

## 2016-09-06 DIAGNOSIS — Z8673 Personal history of transient ischemic attack (TIA), and cerebral infarction without residual deficits: Secondary | ICD-10-CM

## 2016-09-06 DIAGNOSIS — Z8051 Family history of malignant neoplasm of kidney: Secondary | ICD-10-CM

## 2016-09-06 LAB — CBC WITH DIFFERENTIAL/PLATELET
BASOS ABS: 0.1 10*3/uL (ref 0–0.1)
BASOS PCT: 1 %
EOS PCT: 4 %
Eosinophils Absolute: 0.4 10*3/uL (ref 0–0.7)
HCT: 33.9 % — ABNORMAL LOW (ref 35.0–47.0)
Hemoglobin: 11.2 g/dL — ABNORMAL LOW (ref 12.0–16.0)
LYMPHS PCT: 26 %
Lymphs Abs: 2.4 10*3/uL (ref 1.0–3.6)
MCH: 26.5 pg (ref 26.0–34.0)
MCHC: 33.1 g/dL (ref 32.0–36.0)
MCV: 79.9 fL — AB (ref 80.0–100.0)
MONO ABS: 0.7 10*3/uL (ref 0.2–0.9)
Monocytes Relative: 8 %
Neutro Abs: 5.6 10*3/uL (ref 1.4–6.5)
Neutrophils Relative %: 61 %
PLATELETS: 319 10*3/uL (ref 150–440)
RBC: 4.24 MIL/uL (ref 3.80–5.20)
RDW: 14.5 % (ref 11.5–14.5)
WBC: 9.2 10*3/uL (ref 3.6–11.0)

## 2016-09-06 LAB — PROTIME-INR
INR: 1.64
Prothrombin Time: 19.6 seconds — ABNORMAL HIGH (ref 11.4–15.2)

## 2016-09-06 NOTE — Progress Notes (Signed)
Patient is here for follow up,  

## 2016-09-07 ENCOUNTER — Other Ambulatory Visit: Payer: Self-pay

## 2016-09-07 DIAGNOSIS — C801 Malignant (primary) neoplasm, unspecified: Secondary | ICD-10-CM

## 2016-09-07 NOTE — Progress Notes (Signed)
[place] NOTE  Patient Care Team: Allyne Gee, MD as PCP - General (Internal Medicine) Seeplaputhur Robinette Haines, MD (General Surgery) [name], NP as Nurse Practitioner (Family Medicine) Lavera Guise, MD (Internal Medicine)  CHIEF COMPLAINTS/PURPOSE OF CONSULTATION: Malignancy of the thigh.   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 PET- Avid thigh mass/? Tumor thrombus; Bil avid lung nodules; Left pelvic LN;    # MOLECULAR TEST- F-ONE-PDL-1 HIGH [>50% cell]  # Hx of lung nodules [Dr.Khan]   # A.fib on coumadin; ? LLE DVT [? 6-7 years ago]; Smoker [quit >20 y]     Carcinoma of unknown primary ([place])   08/02/2016 Initial Diagnosis    Carcinoma of unknown primary ([place])      HISTORY OF PRESENTING ILLNESS:  Monica [name] 82 y.o.  female noted to have a mass in the left medial thigh- With pelvic adenopathy and bilateral lung nodules- likely sarcoma. Patient has now completed her XRT. She returns to clinic today for further evaluation and discussion of systemic treatment. She continues to have a mass on her left thigh, but states it has improved mildly with radiation. She denies any current pain. She has no chest pain or shortness of breath. She has no neurologic complaint. She denies any recent fevers. She has no nausea, vomiting, constipation, or diarrhea. She has no further evidence of GI bleed. Patient offers no specific complaints today.    ROS: A complete 10 point review of system is done which is negative except mentioned above in history of present illness  MEDICAL HISTORY:  Past Medical History:  Diagnosis Date  . Atrial fibrillation ([place])   . Bronchitis   . Cancer ([place])    Left leg growth, kidneys, lungs and breasts  . Carcinoma of unknown primary ([place])   . COPD (chronic obstructive pulmonary disease) ([place])   . Diabetes mellitus without complication ([place])    . Hyperlipidemia   . Hypertension   . Pancreatitis   . Pneumonia   . Stroke ([place])   . Vitamin B12 deficiency     SURGICAL HISTORY: Past Surgical History:  Procedure Laterality Date  . ABDOMINAL HYSTERECTOMY  1975  . BLADDER SUSPENSION  1989  . 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 She walks with cane and a walker. Social History   Social History  . Marital status: Divorced    Spouse name: N/A  . Number of children: N/A  . Years of education: N/A   Occupational History  . Not on file.   Social History Main Topics  . Smoking status: Former Smoker    Packs/day: 0.50    Years: 15.00    Types: Cigarettes    Quit date: 11/15/1988  . Smokeless tobacco: Never Used  . Alcohol use No  . Drug use: No  . Sexual activity: Not on file     Comment: hysterectomy    Other Topics Concern  . Not on file   Social History Narrative  . No narrative on file    FAMILY HISTORY: brother- colon-70s.. Sister- kidney cancer 58s.  No family history on file.  ALLERGIES:  is allergic to sulfa antibiotics; celecoxib; acetaminophen; codeine; penicillin g; and petrolatum-zinc oxide.  MEDICATIONS:  Current Outpatient Prescriptions  Medication Sig Dispense Refill  . albuterol (  PROVENTIL HFA;VENTOLIN HFA) 108 (90 Base) MCG/ACT inhaler Inhale 1 puff into the lungs every 4 (four) hours as needed for wheezing or shortness of breath.    Marland Kitchen amLODipine-benazepril (LOTREL) 5-20 MG capsule TAKE 1 CAPSULE BY MOUTH EVERY DAY 90 capsule 0  . b complex vitamins capsule Take 1 capsule by mouth daily.    . Fluticasone Furoate-Vilanterol 100-25 MCG/INH AEPB Inhale into the lungs.    . furosemide (LASIX) 20 MG tablet     . gabapentin (NEURONTIN) 300 MG capsule TAKE 2 CAPSULES BY MOUTH AT BEDTIME 60 capsule 5  . glipiZIDE (GLIPIZIDE XL) 2.5 MG 24 hr tablet Take 1 tablet (2.5 mg total) by mouth daily. PATIENT NEEDS TO SCHEDULE  OFFICE VISIT FOR FOLLOW UP 90 tablet 0  . metFORMIN (GLUCOPHAGE) 500 MG tablet TAKE ONE TABLET BY MOUTH TWICE DAILY 180 tablet 0  . oxybutynin (DITROPAN-XL) 10 MG 24 hr tablet TAKE ONE TABLET EVERY DAY 90 tablet 3  . pravastatin (PRAVACHOL) 40 MG tablet Take 40 mg by mouth daily.    . theophylline (THEO-24) 100 MG 24 hr capsule Take 100 mg by mouth daily.    Marland Kitchen warfarin (COUMADIN) 4 MG tablet 2 mg.     . cephALEXin (KEFLEX) 500 MG capsule Take 1 capsule (500 mg total) by mouth every 12 (twelve) hours. (Patient not taking: Reported on 09/06/2016) 14 capsule 0  . famotidine (PEPCID) 20 MG tablet Take 1 tablet (20 mg total) by mouth 2 (two) times daily. (Patient not taking: Reported on 09/06/2016) 30 tablet 0  . hydrochlorothiazide (HYDRODIURIL) 25 MG tablet Take 25 mg by mouth daily.    . montelukast (SINGULAIR) 10 MG tablet Take 10 mg by mouth at bedtime.    Marland Kitchen Umeclidinium-Vilanterol (ANORO ELLIPTA) 62.5-25 MCG/INH AEPB      No current facility-administered medications for this visit.       Marland Kitchen  PHYSICAL EXAMINATION: ECOG PERFORMANCE STATUS: 1 - Symptomatic but completely ambulatory  Vitals:   09/06/16 1621  BP: 123/69  Pulse: 70  Resp: 18  Temp: 97 F (36.1 C)   Filed Weights   09/06/16 1621  Weight: 155 lb 6.4 oz (70.5 kg)    GENERAL: Well-nourished well-developed; Alert, no distress and comfortable.   In a wheelchair. Accompanied by 3 daughters. EYES: no pallor or icterus OROPHARYNX: no thrush or ulceration; good dentition  NECK: supple, no masses felt LYMPH:  no palpable lymphadenopathy in the cervical, axillary or inguinal regions LUNGS: clear to auscultation and  No wheeze or crackles HEART/CVS: regular rate & rhythm and no murmurs; No lower extremity edema; left medial thigh 6-7 cm mass noted ABDOMEN: abdomen soft, non-tender and normal bowel sounds Musculoskeletal:no cyanosis of digits and no clubbing  PSYCH: alert & oriented x 3 with fluent speech NEURO: no focal  motor/sensory deficits SKIN:  no rashes or significant lesions  LABORATORY DATA:  I have reviewed the data as listed Lab Results  Component Value Date   WBC 9.2 09/06/2016   HGB 11.2 (L) 09/06/2016   HCT 33.9 (L) 09/06/2016   MCV 79.9 (L) 09/06/2016   PLT 319 09/06/2016    Recent Labs  08/02/16 1642 08/08/16 0706 08/09/16 0419 08/10/16 0422  NA 128* 129* 137 137  K 3.9 4.5 3.9 3.6  CL 94* 96* 107 108  CO2 _0 GLUCOSE 81 166* 158* 134*  BUN _1 CREATININE 0.80 0.88 0.74 0.74  CALCIUM 9.2 8.7* 8.4* 8.3*  GFRNONAA >60 57* >  60 >60  GFRAA >60 >60 >60 >60  PROT 7.5 6.1*  --   --   ALBUMIN 4.3 3.3*  --   --   AST 26 22  --   --   ALT 18 14  --   --   ALKPHOS 86 66  --   --   BILITOT 0.3 0.1*  --   --     RADIOGRAPHIC STUDIES: I have personally reviewed the radiological images as listed and agreed with the findings in the report. No results found.  ASSESSMENT & PLAN:   Left thigh mass- along with left pelvic adenopathy; and bilateral lung nodules- biopsy high-grade sarcoma [more likely] versus carcinoma [less likely]. Foundation one for any actional targets/MMR is pending. I do not think patient is a good candidate for chemotherapy. Patient has now completed XRT to her left thigh with mild improvement of her symptoms. After lengthy discussion with the patient, she has agreed to attempt systemic therapy using Keytruda. She will return to clinic on September 14, 2016 for chemotherapy education class and then on September 20, 2016 to initiate cycle 1 of treatment.  # A/fib/ LLE DVT- started back on coumadin by PCP.  # GI bleed- on Coumadin- re-started back on coumadin by PCP.   # Will call the patient and daughter with updates, in the interim if any Ph:  [phone]/ home; [place] Hand[place]/ daughter.   Approximately 30 minutes was spent in discussion of which greater than 50% was consultation.    Lloyd Huger, MD 09/07/2016 12:28 AM

## 2016-09-09 ENCOUNTER — Telehealth: Payer: Self-pay

## 2016-09-09 ENCOUNTER — Other Ambulatory Visit: Payer: Self-pay | Admitting: Internal Medicine

## 2016-09-09 ENCOUNTER — Ambulatory Visit (INDEPENDENT_AMBULATORY_CARE_PROVIDER_SITE_OTHER): Payer: Medicare Other | Admitting: Gastroenterology

## 2016-09-09 ENCOUNTER — Encounter: Payer: Self-pay | Admitting: Gastroenterology

## 2016-09-09 VITALS — BP 147/67 | HR 90 | Temp 97.7°F | Ht 64.0 in | Wt 160.0 lb

## 2016-09-09 DIAGNOSIS — G939 Disorder of brain, unspecified: Secondary | ICD-10-CM | POA: Insufficient documentation

## 2016-09-09 DIAGNOSIS — R609 Edema, unspecified: Secondary | ICD-10-CM | POA: Insufficient documentation

## 2016-09-09 DIAGNOSIS — M25519 Pain in unspecified shoulder: Secondary | ICD-10-CM | POA: Insufficient documentation

## 2016-09-09 DIAGNOSIS — G5600 Carpal tunnel syndrome, unspecified upper limb: Secondary | ICD-10-CM | POA: Insufficient documentation

## 2016-09-09 DIAGNOSIS — Z86718 Personal history of other venous thrombosis and embolism: Secondary | ICD-10-CM | POA: Insufficient documentation

## 2016-09-09 DIAGNOSIS — J45909 Unspecified asthma, uncomplicated: Secondary | ICD-10-CM | POA: Insufficient documentation

## 2016-09-09 DIAGNOSIS — G2581 Restless legs syndrome: Secondary | ICD-10-CM | POA: Insufficient documentation

## 2016-09-09 DIAGNOSIS — R270 Ataxia, unspecified: Secondary | ICD-10-CM | POA: Insufficient documentation

## 2016-09-09 DIAGNOSIS — E538 Deficiency of other specified B group vitamins: Secondary | ICD-10-CM | POA: Insufficient documentation

## 2016-09-09 DIAGNOSIS — M4800 Spinal stenosis, site unspecified: Secondary | ICD-10-CM | POA: Insufficient documentation

## 2016-09-09 DIAGNOSIS — Z87442 Personal history of urinary calculi: Secondary | ICD-10-CM | POA: Insufficient documentation

## 2016-09-09 DIAGNOSIS — E559 Vitamin D deficiency, unspecified: Secondary | ICD-10-CM | POA: Insufficient documentation

## 2016-09-09 DIAGNOSIS — K921 Melena: Secondary | ICD-10-CM | POA: Diagnosis not present

## 2016-09-09 DIAGNOSIS — I482 Chronic atrial fibrillation: Secondary | ICD-10-CM | POA: Diagnosis not present

## 2016-09-09 DIAGNOSIS — H269 Unspecified cataract: Secondary | ICD-10-CM | POA: Insufficient documentation

## 2016-09-09 DIAGNOSIS — M109 Gout, unspecified: Secondary | ICD-10-CM | POA: Insufficient documentation

## 2016-09-09 DIAGNOSIS — J984 Other disorders of lung: Secondary | ICD-10-CM | POA: Insufficient documentation

## 2016-09-09 DIAGNOSIS — I6782 Cerebral ischemia: Secondary | ICD-10-CM | POA: Insufficient documentation

## 2016-09-09 NOTE — Progress Notes (Signed)
DISCONTINUE ON PATHWAY REGIMEN - [Other Dx]      START OFF PATHWAY REGIMEN - [Other Dx]  Pembrolizumab 200 mg q21 Days  [id number]:Pembrolizumab 200 mg q21 Days:   A cycle is 21 days:     Pembrolizumab [facility] - [facility])) 200 mg flat dose in 50 mL NS IV over 30 minutes every 21 days.  Inline filter required (low-protein binding) Dose Mod: None Additional Orders: Severe immune-mediated reactions can occur. See prescribing information for more details and required immediate management with steroids. Monitor thyroid, renal, liver function tests, glucose, and sodium at baseline and before each  dose of pembrolizumab. Ref: Keytruda(R) (pembrolizumab) prescribing information, 2016.  **Always confirm dose/schedule in your pharmacy ordering system**    Intent of Therapy: Non-Curative / Palliative Intent, Discussed with Patient

## 2016-09-09 NOTE — Progress Notes (Signed)
Patient on plan of care prior to pathways. 

## 2016-09-09 NOTE — Telephone Encounter (Signed)
Called and spoke with pt.  Per pt she checks her PT/INR at home and today it was 2.1.  Pt states we don't need to worry about her PT/INR because she calls it into the company and the company reports to her PCP.  No other concerns noted.

## 2016-09-09 NOTE — Progress Notes (Signed)
Primary Care Physician: Monica Gee, MD  Primary Gastroenterologist:  Dr. [name]  Chief Complaint  Patient presents with  . Hospitalization Follow-up  . Rectal Bleeding    HPI: Monica [name] is a 82 y.o. female here For follow-up after being in the hospital with a GI bleed. The patient has had no further bleeding and states she has been doing well. The patient and her daughter are not interested in pursuing invasive tests for her bleeding since she has stopped. The patient is now being followed by oncology for cancer in her thigh.  Current Outpatient Prescriptions  Medication Sig Dispense Refill  . albuterol (PROVENTIL HFA;VENTOLIN HFA) 108 (90 Base) MCG/ACT inhaler Inhale 1 puff into the lungs every 4 (four) hours as needed for wheezing or shortness of breath.    Marland Kitchen amLODipine-benazepril (LOTREL) 5-20 MG capsule TAKE 1 CAPSULE BY MOUTH EVERY DAY 90 capsule 0  . b complex vitamins capsule Take 1 capsule by mouth daily.    . cephALEXin (KEFLEX) 500 MG capsule Take 1 capsule (500 mg total) by mouth every 12 (twelve) hours. 14 capsule 0  . famotidine (PEPCID) 20 MG tablet Take 1 tablet (20 mg total) by mouth 2 (two) times daily. 30 tablet 0  . Fluticasone Furoate-Vilanterol 100-25 MCG/INH AEPB Inhale into the lungs.    . furosemide (LASIX) 20 MG tablet     . gabapentin (NEURONTIN) 300 MG capsule TAKE 2 CAPSULES BY MOUTH AT BEDTIME 60 capsule 5  . glipiZIDE (GLIPIZIDE XL) 2.5 MG 24 hr tablet Take 1 tablet (2.5 mg total) by mouth daily. PATIENT NEEDS TO SCHEDULE OFFICE VISIT FOR FOLLOW UP 90 tablet 0  . hydrochlorothiazide (HYDRODIURIL) 25 MG tablet Take 25 mg by mouth daily.    . metFORMIN (GLUCOPHAGE) 500 MG tablet TAKE ONE TABLET BY MOUTH TWICE DAILY 180 tablet 0  . montelukast (SINGULAIR) 10 MG tablet Take 10 mg by mouth at bedtime.    Marland Kitchen oxybutynin (DITROPAN-XL) 10 MG 24 hr tablet TAKE ONE TABLET EVERY DAY 90 tablet 3  . pravastatin (PRAVACHOL) 40 MG tablet Take 40 mg by mouth  daily.    . theophylline (THEO-24) 100 MG 24 hr capsule Take 100 mg by mouth daily.    Marland Kitchen Umeclidinium-Vilanterol (ANORO ELLIPTA) 62.5-25 MCG/INH AEPB     . warfarin (COUMADIN) 4 MG tablet 2 mg.      No current facility-administered medications for this visit.     Allergies as of 09/09/2016 - Review Complete 09/09/2016  Allergen Reaction Noted  . Sulfa antibiotics Swelling 08/08/2016  . Celecoxib Nausea And Vomiting 12/09/2015  . Acetaminophen Itching 12/09/2015  . Codeine Rash 12/09/2015  . Penicillin g Rash 12/09/2015  . Petrolatum-zinc oxide Rash 12/09/2015    ROS:  General: Negative for anorexia, weight loss, fever, chills, fatigue, weakness. ENT: Negative for hoarseness, difficulty swallowing , nasal congestion. CV: Negative for chest pain, angina, palpitations, dyspnea on exertion, peripheral edema.  Respiratory: Negative for dyspnea at rest, dyspnea on exertion, cough, sputum, wheezing.  GI: See history of present illness. GU:  Negative for dysuria, hematuria, urinary incontinence, urinary frequency, nocturnal urination.  Endo: Negative for unusual weight change.    Physical Examination:   BP (!) 147/67   Pulse 90   Temp 97.7 F (36.5 C) (Oral)   Ht 5\' 4"  (1.626 m)   Wt 160 lb (72.6 kg)   BMI 27.46 kg/m   General: Well-nourished, well-developed in no acute distress.  Eyes: No icterus. Conjunctivae pink. Neuro: Alert and  oriented x 3.  Grossly intact. Skin: Warm and dry, no jaundice.   Psych: Alert and cooperative, normal mood and affect.  Labs:    Imaging Studies: No results found.  Assessment and Plan:   Monica [name] is a 82 y.o. y/o female for follow-up after having a GI bleed and the discharge from the hospital. The patient has been doing well without any black stools or bloody stools. The patient states she and her daughter are not interested in any invasive workup for the cause of her GI bleeding and she is more concerned with immunotherapy she is  undergoing for her cancer. The patient has been told to contact me if she has any further concerns or problems. They have been explained the plan and agree with it.   Note: This dictation was prepared with Dragon dictation along with smaller phrase technology. Any transcriptional errors that result from this process are unintentional.

## 2016-09-10 NOTE — Patient Instructions (Signed)
Pembrolizumab injection What is this medicine? PEMBROLIZUMAB (pem broe liz ue mab) is a monoclonal antibody. It is used to treat melanoma and non-small cell lung cancer. This medicine may be used for other purposes; ask your health care provider or pharmacist if you have questions. What should I tell my health care provider before I take this medicine? They need to know if you have any of these conditions: -diabetes -immune system problems -inflammatory bowel disease -liver disease -lung or breathing disease -lupus -an unusual or allergic reaction to pembrolizumab, other medicines, foods, dyes, or preservatives -pregnant or trying to get pregnant -breast-feeding How should I use this medicine? This medicine is for infusion into a vein. It is given by a health care professional in a hospital or clinic setting. A special MedGuide will be given to you before each treatment. Be sure to read this information carefully each time. Talk to your pediatrician regarding the use of this medicine in children. Special care may be needed. Overdosage: If you think you have taken too much of this medicine contact a poison control center or emergency room at once. NOTE: This medicine is only for you. Do not share this medicine with others. What if I miss a dose? It is important not to miss your dose. Call your doctor or health care professional if you are unable to keep an appointment. What may interact with this medicine? Interactions have not been studied. Give your health care provider a list of all the medicines, herbs, non-prescription drugs, or dietary supplements you use. Also tell them if you smoke, drink alcohol, or use illegal drugs. Some items may interact with your medicine. This list may not describe all possible interactions. Give your health care provider a list of all the medicines, herbs, non-prescription drugs, or dietary supplements you use. Also tell them if you smoke, drink alcohol, or  use illegal drugs. Some items may interact with your medicine. What should I watch for while using this medicine? Your condition will be monitored carefully while you are receiving this medicine. You may need blood work done while you are taking this medicine. Do not become pregnant while taking this medicine or for 4 months after stopping it. Women should inform their doctor if they wish to become pregnant or think they might be pregnant. There is a potential for serious side effects to an unborn child. Talk to your health care professional or pharmacist for more information. Do not breast-feed an infant while taking this medicine or for 4 months after the last dose. What side effects may I notice from receiving this medicine? Side effects that you should report to your doctor or health care professional as soon as possible: -allergic reactions like skin rash, itching or hives, swelling of the face, lips, or tongue -bloody or black, tarry stools -breathing problems -change in the amount of urine -changes in vision -chest pain -chills -dark urine -dizziness or feeling faint or lightheaded -fast or irregular heartbeat -fever -flushing -hair loss -muscle pain -muscle weakness -persistent headache -signs and symptoms of high blood sugar such as dizziness; dry mouth; dry skin; fruity breath; nausea; stomach pain; increased hunger or thirst; increased urination -signs and symptoms of liver injury like dark urine, light-colored stools, loss of appetite, nausea, right upper belly pain, yellowing of the eyes or skin -stomach pain -weight loss Side effects that usually do not require medical attention (Report these to your doctor or health care professional if they continue or are bothersome.):constipation -cough -diarrhea -joint pain -  tiredness This list may not describe all possible side effects. Call your doctor for medical advice about side effects. You may report side effects to FDA at  1-800-FDA-1088. Where should I keep my medicine? This drug is given in a hospital or clinic and will not be stored at home. NOTE: This sheet is a summary. It may not cover all possible information. If you have questions about this medicine, talk to your doctor, pharmacist, or health care provider.    2016, Elsevier/Gold Standard. (2014-12-31 17:24:19)  

## 2016-09-14 ENCOUNTER — Inpatient Hospital Stay: Payer: Medicare Other

## 2016-09-16 DIAGNOSIS — I482 Chronic atrial fibrillation: Secondary | ICD-10-CM | POA: Diagnosis not present

## 2016-09-16 DIAGNOSIS — E1165 Type 2 diabetes mellitus with hyperglycemia: Secondary | ICD-10-CM | POA: Diagnosis not present

## 2016-09-16 DIAGNOSIS — Z7901 Long term (current) use of anticoagulants: Secondary | ICD-10-CM | POA: Diagnosis not present

## 2016-09-16 DIAGNOSIS — N390 Urinary tract infection, site not specified: Secondary | ICD-10-CM | POA: Diagnosis not present

## 2016-09-16 DIAGNOSIS — N393 Stress incontinence (female) (male): Secondary | ICD-10-CM | POA: Diagnosis not present

## 2016-09-20 ENCOUNTER — Inpatient Hospital Stay: Payer: Medicare Other | Attending: Internal Medicine

## 2016-09-20 ENCOUNTER — Inpatient Hospital Stay: Payer: Medicare Other

## 2016-09-20 ENCOUNTER — Inpatient Hospital Stay (HOSPITAL_BASED_OUTPATIENT_CLINIC_OR_DEPARTMENT_OTHER): Payer: Medicare Other | Admitting: Internal Medicine

## 2016-09-20 VITALS — BP 124/67 | HR 72 | Temp 98.1°F | Resp 20 | Wt 156.6 lb

## 2016-09-20 DIAGNOSIS — Z8701 Personal history of pneumonia (recurrent): Secondary | ICD-10-CM | POA: Insufficient documentation

## 2016-09-20 DIAGNOSIS — C801 Malignant (primary) neoplasm, unspecified: Secondary | ICD-10-CM

## 2016-09-20 DIAGNOSIS — R590 Localized enlarged lymph nodes: Secondary | ICD-10-CM

## 2016-09-20 DIAGNOSIS — E871 Hypo-osmolality and hyponatremia: Secondary | ICD-10-CM

## 2016-09-20 DIAGNOSIS — R918 Other nonspecific abnormal finding of lung field: Secondary | ICD-10-CM

## 2016-09-20 DIAGNOSIS — I890 Lymphedema, not elsewhere classified: Secondary | ICD-10-CM | POA: Insufficient documentation

## 2016-09-20 DIAGNOSIS — Z5112 Encounter for antineoplastic immunotherapy: Secondary | ICD-10-CM | POA: Diagnosis not present

## 2016-09-20 DIAGNOSIS — J449 Chronic obstructive pulmonary disease, unspecified: Secondary | ICD-10-CM | POA: Diagnosis not present

## 2016-09-20 DIAGNOSIS — C7989 Secondary malignant neoplasm of other specified sites: Secondary | ICD-10-CM | POA: Diagnosis not present

## 2016-09-20 DIAGNOSIS — I10 Essential (primary) hypertension: Secondary | ICD-10-CM | POA: Diagnosis not present

## 2016-09-20 DIAGNOSIS — Z7984 Long term (current) use of oral hypoglycemic drugs: Secondary | ICD-10-CM | POA: Insufficient documentation

## 2016-09-20 DIAGNOSIS — Z86718 Personal history of other venous thrombosis and embolism: Secondary | ICD-10-CM | POA: Insufficient documentation

## 2016-09-20 DIAGNOSIS — Z8673 Personal history of transient ischemic attack (TIA), and cerebral infarction without residual deficits: Secondary | ICD-10-CM

## 2016-09-20 DIAGNOSIS — E785 Hyperlipidemia, unspecified: Secondary | ICD-10-CM | POA: Insufficient documentation

## 2016-09-20 DIAGNOSIS — Z923 Personal history of irradiation: Secondary | ICD-10-CM | POA: Insufficient documentation

## 2016-09-20 DIAGNOSIS — I4891 Unspecified atrial fibrillation: Secondary | ICD-10-CM | POA: Insufficient documentation

## 2016-09-20 DIAGNOSIS — Z7901 Long term (current) use of anticoagulants: Secondary | ICD-10-CM | POA: Diagnosis not present

## 2016-09-20 DIAGNOSIS — Z8051 Family history of malignant neoplasm of kidney: Secondary | ICD-10-CM

## 2016-09-20 DIAGNOSIS — Z79899 Other long term (current) drug therapy: Secondary | ICD-10-CM | POA: Insufficient documentation

## 2016-09-20 DIAGNOSIS — N390 Urinary tract infection, site not specified: Secondary | ICD-10-CM

## 2016-09-20 DIAGNOSIS — E539 Vitamin B deficiency, unspecified: Secondary | ICD-10-CM | POA: Insufficient documentation

## 2016-09-20 DIAGNOSIS — E119 Type 2 diabetes mellitus without complications: Secondary | ICD-10-CM | POA: Diagnosis not present

## 2016-09-20 DIAGNOSIS — Z87891 Personal history of nicotine dependence: Secondary | ICD-10-CM | POA: Insufficient documentation

## 2016-09-20 LAB — COMPREHENSIVE METABOLIC PANEL
ALT: 14 U/L (ref 14–54)
AST: 24 U/L (ref 15–41)
Albumin: 3.9 g/dL (ref 3.5–5.0)
Alkaline Phosphatase: 78 U/L (ref 38–126)
Anion gap: 8 (ref 5–15)
BUN: 19 mg/dL (ref 6–20)
CHLORIDE: 93 mmol/L — AB (ref 101–111)
CO2: 26 mmol/L (ref 22–32)
CREATININE: 0.87 mg/dL (ref 0.44–1.00)
Calcium: 9.4 mg/dL (ref 8.9–10.3)
GFR calc Af Amer: >60 mL/min (ref >60)
GFR, EST NON AFRICAN AMERICAN: 58 mL/min — AB (ref >60)
Glucose, Bld: 104 mg/dL — ABNORMAL HIGH (ref 65–99)
Potassium: 4.1 mmol/L (ref 3.5–5.1)
Sodium: 127 mmol/L — ABNORMAL LOW (ref 135–145)
Total Bilirubin: 0.4 mg/dL (ref 0.3–1.2)
Total Protein: 7.1 g/dL (ref 6.5–8.1)

## 2016-09-20 LAB — CBC WITH DIFFERENTIAL/PLATELET
BASOS ABS: 0.1 10*3/uL (ref 0–0.1)
Basophils Relative: 1 %
EOS PCT: 3 %
Eosinophils Absolute: 0.3 10*3/uL (ref 0–0.7)
HEMATOCRIT: 34.3 % — AB (ref 35.0–47.0)
HEMOGLOBIN: 11.5 g/dL — AB (ref 12.0–16.0)
LYMPHS PCT: 24 %
Lymphs Abs: 2.1 10*3/uL (ref 1.0–3.6)
MCH: 26.4 pg (ref 26.0–34.0)
MCHC: 33.5 g/dL (ref 32.0–36.0)
MCV: 78.8 fL — AB (ref 80.0–100.0)
Monocytes Absolute: 0.9 10*3/uL (ref 0.2–0.9)
Monocytes Relative: 10 %
NEUTROS ABS: 5.4 10*3/uL (ref 1.4–6.5)
NEUTROS PCT: 62 %
PLATELETS: 294 10*3/uL (ref 150–440)
RBC: 4.36 MIL/uL (ref 3.80–5.20)
RDW: 14.9 % — ABNORMAL HIGH (ref 11.5–14.5)
WBC: 8.8 10*3/uL (ref 3.6–11.0)

## 2016-09-20 LAB — FERRITIN: Ferritin: 9 ng/mL — ABNORMAL LOW (ref 11–307)

## 2016-09-20 LAB — IRON AND TIBC
Iron: 40 ug/dL (ref 28–170)
SATURATION RATIOS: 9 % — AB (ref 10.4–31.8)
TIBC: 433 ug/dL (ref 250–450)
UIBC: 393 ug/dL

## 2016-09-20 MED ORDER — SODIUM CHLORIDE 0.9 % IV SOLN
200.0000 mg | Freq: Once | INTRAVENOUS | Status: AC
  Administered 2016-09-20: 200 mg via INTRAVENOUS
  Filled 2016-09-20: qty 8

## 2016-09-20 MED ORDER — SODIUM CHLORIDE 0.9 % IV SOLN
Freq: Once | INTRAVENOUS | Status: AC
  Administered 2016-09-20: 15:00:00 via INTRAVENOUS
  Filled 2016-09-20: qty 1000

## 2016-09-20 NOTE — Assessment & Plan Note (Addendum)
Left thigh mass- along with left pelvic adenopathy; and bilateral lung nodules- biopsy high-grade sarcoma [more likely] versus carcinoma [less likely]. S/p RT to left thigh- significant improvement noted.  # High PDL-1 expression; recommend Keytruda. Proceed with cycle #1 today.   # I discussed the mechanism of action; The goal of therapy is palliative; and length of treatments are likely ongoing/based upon the results of the scans. Discussed the potential side effects of immunotherapy including but not limited to diarrhea; skin rash; elevated LFTs/endocrine abnormalities etc.  # UTI- on ciprofloxacin.   # Hyponatremia- sodium 127; recommend holding off HCTZ.   # Left LE swelling ? Sec to Lymphedema; recommend leg elevation.   # A/fib/ LLE DVT- started back on coumadin by PCP.  # Hx of GI bleed- on Coumadin- re-started back on coumadin by PCP. Hb stable around 11.5/ check Iron studies/ ferrtin today.   # follow up in 3 weeks/labs/ infusion.  

## 2016-09-20 NOTE — Progress Notes (Signed)
[facility] NOTE  Patient Care Team: Allyne Gee, MD as PCP - General (Internal Medicine) Seeplaputhur Robinette Haines, MD (General Surgery) [name], NP as Nurse Practitioner (Family Medicine) Lavera Guise, MD (Internal Medicine)  CHIEF COMPLAINTS/PURPOSE OF CONSULTATION: Malignancy of the thigh.   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 PET- Avid thigh mass/? Tumor thrombus; Bil avid lung nodules; Left pelvic LN;    # Nov 6th- START KEYTRUDA q 3W  # s/p RT to left thigh [finished oct 21st 2017]  # MOLECULAR TEST- F-ONE-PDL-1 HIGH [>50% cell]  # Hx of lung nodules [Dr.Khan]   # A.fib on coumadin; ? LLE DVT [? 6-7 years ago]; Smoker [quit >20 y]     Carcinoma of unknown primary ([place])   08/02/2016 Initial Diagnosis    Carcinoma of unknown primary ([place])      HISTORY OF PRESENTING ILLNESS:  Monica [name] 82 y.o.  [name] noted to have a mass in the left medial thigh- With pelvic adenopathy and bilateral lung nodules- likely sarcoma currently on radiation is here for follow-up./Proceed with cycle #1 of  Beryle Flock is here follow-up. She finished radiation approximately 2 weeks ago.   Patient notes she has significant improvement of her left thigh mass. Not completely resolved. Significant improvement in pain. Denies any blood in stools or black stools. No nausea no vomiting. No diarrhea.  Currently denies any blood in stools. Denies any significant pain.   ROS: A complete 10 point review of system is done which is negative except mentioned above in history of present illness  MEDICAL HISTORY:  Past Medical History:  Diagnosis Date  . Atrial fibrillation ([place])   . Bronchitis   . Cancer ([place])    Left leg growth, kidneys, lungs and breasts  . Carcinoma of unknown primary ([place])   . COPD (chronic obstructive pulmonary disease) ([place])   . Diabetes mellitus without  complication ([place])   . Hyperlipidemia   . Hypertension   . Pancreatitis   . Pneumonia   . Stroke ([place])   . Vitamin B12 deficiency     SURGICAL HISTORY: Past Surgical History:  Procedure Laterality Date  . ABDOMINAL HYSTERECTOMY  1975  . BLADDER SUSPENSION  1989  . 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 She walks with cane and a walker. Social History   Social History  . Marital status: Divorced    Spouse name: N/A  . Number of children: N/A  . Years of education: N/A   Occupational History  . Not on file.   Social History Main Topics  . Smoking status: Former Smoker    Packs/day: 0.50    Years: 15.00    Types: Cigarettes    Quit date: 11/15/1988  . Smokeless tobacco: Never Used  . Alcohol use No  . Drug use: No  . Sexual activity: Not on file     Comment: hysterectomy    Other Topics Concern  . Not on file   Social History Narrative  . No narrative on file    FAMILY HISTORY: brother- colon-70s.. Sister- kidney cancer 20s.  No family history on file.  ALLERGIES:  is allergic to sulfa antibiotics; celecoxib; acetaminophen; codeine; penicillin g; and petrolatum-zinc oxide.  MEDICATIONS:  Current Outpatient Prescriptions  Medication Sig Dispense Refill  .  albuterol (PROVENTIL HFA;VENTOLIN HFA) 108 (90 Base) MCG/ACT inhaler Inhale 1 puff into the lungs every 4 (four) hours as needed for wheezing or shortness of breath.    Marland Kitchen amLODipine-benazepril (LOTREL) 5-20 MG capsule TAKE 1 CAPSULE BY MOUTH EVERY DAY 90 capsule 0  . b complex vitamins capsule Take 1 capsule by mouth daily.    . cephALEXin (KEFLEX) 500 MG capsule Take 1 capsule (500 mg total) by mouth every 12 (twelve) hours. 14 capsule 0  . ciprofloxacin (CIPRO) 500 MG tablet     . famotidine (PEPCID) 20 MG tablet Take 1 tablet (20 mg total) by mouth 2 (two) times daily. 30 tablet 0  . Fluticasone Furoate-Vilanterol 100-25  MCG/INH AEPB Inhale into the lungs.    . furosemide (LASIX) 20 MG tablet     . gabapentin (NEURONTIN) 300 MG capsule TAKE 2 CAPSULES BY MOUTH AT BEDTIME 60 capsule 5  . glipiZIDE (GLIPIZIDE XL) 2.5 MG 24 hr tablet Take 1 tablet (2.5 mg total) by mouth daily. PATIENT NEEDS TO SCHEDULE OFFICE VISIT FOR FOLLOW UP 90 tablet 0  . hydrochlorothiazide (HYDRODIURIL) 25 MG tablet Take 25 mg by mouth daily.    . metFORMIN (GLUCOPHAGE) 500 MG tablet TAKE ONE TABLET BY MOUTH TWICE DAILY 180 tablet 0  . montelukast (SINGULAIR) 10 MG tablet Take 10 mg by mouth at bedtime.    Marland Kitchen oxybutynin (DITROPAN-XL) 10 MG 24 hr tablet TAKE ONE TABLET EVERY DAY 90 tablet 3  . pravastatin (PRAVACHOL) 40 MG tablet Take 40 mg by mouth daily.    . theophylline (THEO-24) 100 MG 24 hr capsule Take 100 mg by mouth daily.    Marland Kitchen Umeclidinium-Vilanterol (ANORO ELLIPTA) 62.5-25 MCG/INH AEPB     . warfarin (COUMADIN) 4 MG tablet 2 mg.      No current facility-administered medications for this visit.       Marland Kitchen  PHYSICAL EXAMINATION: ECOG PERFORMANCE STATUS: 1 - Symptomatic but completely ambulatory  Vitals:   09/20/16 1352  BP: 124/67  Pulse: 72  Resp: 20  Temp: 98.1 F (36.7 C)   Filed Weights   09/20/16 1352  Weight: 156 lb 9.6 oz (71 kg)    GENERAL: Well-nourished well-developed; Alert, no distress and comfortable.   In a wheelchair. Accompanied by 3 daughters. EYES: no pallor or icterus OROPHARYNX: no thrush or ulceration; good dentition  NECK: supple, no masses felt LYMPH:  no palpable lymphadenopathy in the cervical, axillary or inguinal regions LUNGS: clear to auscultation and  No wheeze or crackles HEART/CVS: regular rate & rhythm and no murmurs; No lower extremity edema; left medial thigh 6-7 cm mass noted ABDOMEN: abdomen soft, non-tender and normal bowel sounds Musculoskeletal:no cyanosis of digits and no clubbing  PSYCH: alert & oriented x 3 with fluent speech NEURO: no focal motor/sensory deficits SKIN:   no rashes or significant lesions  LABORATORY DATA:  I have reviewed the data as listed Lab Results  Component Value Date   WBC 8.8 09/20/2016   HGB 11.5 (L) 09/20/2016   HCT 34.3 (L) 09/20/2016   MCV 78.8 (L) 09/20/2016   PLT 294 09/20/2016    Recent Labs  08/02/16 1642 08/08/16 0706 08/09/16 0419 08/10/16 0422 09/20/16 1300  NA 128* 129* 137 137 127*  K 3.9 4.5 3.9 3.6 4.1  CL 94* 96* 107 108 93*  CO2 _0 GLUCOSE 81 166* 158* 134* 104*  BUN _1 CREATININE 0.80 0.88 0.74 0.74 0.87  CALCIUM  9.2 8.7* 8.4* 8.3* 9.4  GFRNONAA >60 57* >60 >60 58*  GFRAA >60 >60 >60 >60 >60  PROT 7.5 6.1*  --   --  7.1  ALBUMIN 4.3 3.3*  --   --  3.9  AST 26 22  --   --  24  ALT 18 14  --   --  14  ALKPHOS 86 66  --   --  78  BILITOT 0.3 0.1*  --   --  0.4    RADIOGRAPHIC STUDIES: I have personally reviewed the radiological images as listed and agreed with the findings in the report. No results found.  ASSESSMENT & PLAN:   Carcinoma of unknown primary ([place]) Left thigh mass- along with left pelvic adenopathy; and bilateral lung nodules- biopsy high-grade sarcoma [more likely] versus carcinoma [less likely]. S/p RT to left thigh- significant improvement noted.  # High PDL-1 expression; recommend Keytruda. Proceed with cycle #1 today.   # I discussed the mechanism of action; The goal of therapy is palliative; and length of treatments are likely ongoing/based upon the results of the scans. Discussed the potential side effects of immunotherapy including but not limited to diarrhea; skin rash; elevated LFTs/endocrine abnormalities etc.  # UTI- on ciprofloxacin.   # Hyponatremia- sodium 127; recommend holding off HCTZ.   # Left LE swelling ? Sec to Lymphedema; recommend leg elevation.   # A/fib/ LLE DVT- started back on coumadin by PCP.  # Hx of GI bleed- on Coumadin- re-started back on coumadin by PCP. Hb stable around 11.5/ check Iron studies/ ferrtin today.    # follow up in 3 weeks/labs/ infusion.     Monica Sickle, MD 09/21/2016 6:08 PM

## 2016-09-20 NOTE — Progress Notes (Signed)
Patient is here for follow up,  

## 2016-09-21 LAB — THYROID PANEL WITH TSH
FREE THYROXINE INDEX: 3.1 (ref 1.2–4.9)
T3 Uptake Ratio: 40 % — ABNORMAL HIGH (ref 24–39)
T4 TOTAL: 7.8 ug/dL (ref 4.5–12.0)
TSH: 2.45 u[IU]/mL (ref 0.450–4.500)

## 2016-09-27 ENCOUNTER — Telehealth: Payer: Self-pay | Admitting: *Deleted

## 2016-09-27 NOTE — Telephone Encounter (Signed)
Spoke with patient. Agreeable to sch. IV iron. msg sent to sch. Team to arrange.

## 2016-09-27 NOTE — Telephone Encounter (Signed)
-----   Message from Cammie Sickle, MD sent at 09/25/2016  1:24 AM EST ----- Inform pt that she is low on iron; and if agreeable recommend IV iron. venofer weekly x4; please set this up; starting nov 13th week. Thanks

## 2016-09-28 ENCOUNTER — Other Ambulatory Visit: Payer: Self-pay | Admitting: Internal Medicine

## 2016-09-28 DIAGNOSIS — D500 Iron deficiency anemia secondary to blood loss (chronic): Secondary | ICD-10-CM

## 2016-09-30 ENCOUNTER — Ambulatory Visit
Admission: RE | Admit: 2016-09-30 | Discharge: 2016-09-30 | Disposition: A | Discharge status: Home / Self Care | Payer: Medicare Other | Source: Ambulatory Visit | Attending: Radiation Oncology | Admitting: Radiation Oncology

## 2016-09-30 ENCOUNTER — Inpatient Hospital Stay: Payer: Medicare Other

## 2016-09-30 ENCOUNTER — Encounter: Payer: Self-pay | Admitting: Radiation Oncology

## 2016-09-30 VITALS — BP 129/67 | HR 89 | Temp 98.2°F | Resp 22 | Wt 154.0 lb

## 2016-09-30 VITALS — BP 123/68 | HR 78 | Temp 96.3°F | Resp 18

## 2016-09-30 DIAGNOSIS — C801 Malignant (primary) neoplasm, unspecified: Secondary | ICD-10-CM

## 2016-09-30 DIAGNOSIS — E871 Hypo-osmolality and hyponatremia: Secondary | ICD-10-CM | POA: Diagnosis not present

## 2016-09-30 DIAGNOSIS — C7989 Secondary malignant neoplasm of other specified sites: Secondary | ICD-10-CM | POA: Diagnosis not present

## 2016-09-30 DIAGNOSIS — Z5112 Encounter for antineoplastic immunotherapy: Secondary | ICD-10-CM | POA: Diagnosis not present

## 2016-09-30 DIAGNOSIS — C4922 Malignant neoplasm of connective and soft tissue of left lower limb, including hip: Secondary | ICD-10-CM

## 2016-09-30 DIAGNOSIS — I890 Lymphedema, not elsewhere classified: Secondary | ICD-10-CM | POA: Diagnosis not present

## 2016-09-30 DIAGNOSIS — D500 Iron deficiency anemia secondary to blood loss (chronic): Secondary | ICD-10-CM

## 2016-09-30 DIAGNOSIS — N390 Urinary tract infection, site not specified: Secondary | ICD-10-CM | POA: Diagnosis not present

## 2016-09-30 MED ORDER — SODIUM CHLORIDE 0.9 % IV SOLN
Freq: Once | INTRAVENOUS | Status: AC
  Administered 2016-09-30: 11:00:00 via INTRAVENOUS
  Filled 2016-09-30: qty 1000

## 2016-09-30 MED ORDER — IRON SUCROSE 20 MG/ML IV SOLN
200.0000 mg | Freq: Once | INTRAVENOUS | Status: AC
  Administered 2016-09-30: 200 mg via INTRAVENOUS
  Filled 2016-09-30: qty 10

## 2016-09-30 NOTE — Progress Notes (Signed)
Radiation Oncology Follow up Note  Name: Monica [name]   Date:   09/30/2016 MRN:  [id number] DOB: 1926-03-16    This 82 y.o. female presents to the clinic today for one-month follow-up status post post palliative radiation therapy to her left thigh for metastatic high-grade sarcoma.  REFERRING PROVIDER: Allyne Gee, MD  HPI: Patient is an 82 year old female now seen out 1 month having completed palliative radiation therapy to her left thigh for a poorly differentiated high-grade sarcoma. She has multiple pulmonary nodules as well as disease elsewhere although is asymptomatic at this time she seen today in routine follow-up and is doing well she states the mass has markedly shrunken in size and is causing no problems. She does have bilateral edema in her lower extremities most likely of cardiac origin. She specifically denies cough hemoptysis or chest tightness.. She is about 3 years no longer yes right shoulder is is a problem of this right right but I was is something that he canopy retroactive or participated in all along right right right right but it was the wrong that time he is using 1 right right right right right right right right right right right 4 years exchange except Was unemployed for about 7 years [employer] my own right; email and should since his CMS of explained what have been right shoulder sure sure pressure is on Keytruda tolerating that well without side effect.  COMPLICATIONS OF TREATMENT: none  FOLLOW UP COMPLIANCE: keeps appointments   PHYSICAL EXAM:  BP 129/67   Pulse 89   Temp 98.2 F (36.8 C)   Resp (!) 22   Wt 153 lb 15.9 oz (69.8 kg)   BMI 26.43 kg/m  Area of the left thigh is markedly shrunken size by about 50% and softened significantly. No residual skin changes are noted. She does have edema bilaterally her lower extremities may need an alteration of her Lasix dose. Well-developed well-nourished patient in NAD. HEENT reveals PERLA, EOMI, discs not  visualized.  Oral cavity is clear. No oral mucosal lesions are identified. Neck is clear without evidence of cervical or supraclavicular adenopathy. Lungs are clear to A&P. Cardiac examination is essentially unremarkable with regular rate and rhythm without murmur rub or thrill. Abdomen is benign with no organomegaly or masses noted. Motor sensory and DTR levels are equal and symmetric in the upper and lower extremities. Cranial nerves II through XII are grossly intact. Proprioception is intact. No peripheral adenopathy or edema is identified. No motor or sensory levels are noted. Crude visual fields are within normal range.   RADIOLOGY RESULTS: No current films for review  PLAN: At the present time she is doing well fairly asymptomatic from her metastatic high-grade sarcoma. I'm to turn follow-up care over to medical oncology. She continues on Keytruda. I we happy to reevaluate the patient any time should further palliative treatment be indicated.  I would like to take this opportunity to thank you for allowing me to participate in the care of your patient.Armstead Peaks., MD

## 2016-10-06 ENCOUNTER — Inpatient Hospital Stay: Payer: Medicare Other

## 2016-10-06 VITALS — BP 150/65 | HR 96 | Temp 96.9°F | Resp 16

## 2016-10-06 DIAGNOSIS — Z5112 Encounter for antineoplastic immunotherapy: Secondary | ICD-10-CM | POA: Diagnosis not present

## 2016-10-06 DIAGNOSIS — C801 Malignant (primary) neoplasm, unspecified: Secondary | ICD-10-CM

## 2016-10-06 DIAGNOSIS — N390 Urinary tract infection, site not specified: Secondary | ICD-10-CM | POA: Diagnosis not present

## 2016-10-06 DIAGNOSIS — I890 Lymphedema, not elsewhere classified: Secondary | ICD-10-CM | POA: Diagnosis not present

## 2016-10-06 DIAGNOSIS — E871 Hypo-osmolality and hyponatremia: Secondary | ICD-10-CM | POA: Diagnosis not present

## 2016-10-06 DIAGNOSIS — C7989 Secondary malignant neoplasm of other specified sites: Secondary | ICD-10-CM | POA: Diagnosis not present

## 2016-10-06 DIAGNOSIS — D500 Iron deficiency anemia secondary to blood loss (chronic): Secondary | ICD-10-CM

## 2016-10-06 MED ORDER — SODIUM CHLORIDE 0.9 % IV SOLN
Freq: Once | INTRAVENOUS | Status: AC
  Administered 2016-10-06: 14:00:00 via INTRAVENOUS
  Filled 2016-10-06: qty 1000

## 2016-10-06 MED ORDER — IRON SUCROSE 20 MG/ML IV SOLN
200.0000 mg | Freq: Once | INTRAVENOUS | Status: AC
  Administered 2016-10-06: 200 mg via INTRAVENOUS
  Filled 2016-10-06: qty 10

## 2016-10-06 MED ORDER — SODIUM CHLORIDE 0.9 % IV SOLN: 200.0000 mg | Freq: Once | INTRAVENOUS | Status: DC

## 2016-10-08 DIAGNOSIS — I482 Chronic atrial fibrillation: Secondary | ICD-10-CM | POA: Diagnosis not present

## 2016-10-11 ENCOUNTER — Inpatient Hospital Stay: Payer: Medicare Other

## 2016-10-11 ENCOUNTER — Inpatient Hospital Stay (HOSPITAL_BASED_OUTPATIENT_CLINIC_OR_DEPARTMENT_OTHER): Payer: Medicare Other | Admitting: Internal Medicine

## 2016-10-11 VITALS — BP 145/78 | HR 75 | Temp 98.0°F | Wt 155.0 lb

## 2016-10-11 DIAGNOSIS — E119 Type 2 diabetes mellitus without complications: Secondary | ICD-10-CM

## 2016-10-11 DIAGNOSIS — Z87891 Personal history of nicotine dependence: Secondary | ICD-10-CM

## 2016-10-11 DIAGNOSIS — E539 Vitamin B deficiency, unspecified: Secondary | ICD-10-CM

## 2016-10-11 DIAGNOSIS — I890 Lymphedema, not elsewhere classified: Secondary | ICD-10-CM | POA: Diagnosis not present

## 2016-10-11 DIAGNOSIS — Z8701 Personal history of pneumonia (recurrent): Secondary | ICD-10-CM

## 2016-10-11 DIAGNOSIS — E785 Hyperlipidemia, unspecified: Secondary | ICD-10-CM

## 2016-10-11 DIAGNOSIS — E871 Hypo-osmolality and hyponatremia: Secondary | ICD-10-CM | POA: Diagnosis not present

## 2016-10-11 DIAGNOSIS — N390 Urinary tract infection, site not specified: Secondary | ICD-10-CM

## 2016-10-11 DIAGNOSIS — I4891 Unspecified atrial fibrillation: Secondary | ICD-10-CM

## 2016-10-11 DIAGNOSIS — C801 Malignant (primary) neoplasm, unspecified: Secondary | ICD-10-CM

## 2016-10-11 DIAGNOSIS — R918 Other nonspecific abnormal finding of lung field: Secondary | ICD-10-CM

## 2016-10-11 DIAGNOSIS — Z8051 Family history of malignant neoplasm of kidney: Secondary | ICD-10-CM

## 2016-10-11 DIAGNOSIS — Z5112 Encounter for antineoplastic immunotherapy: Secondary | ICD-10-CM | POA: Diagnosis not present

## 2016-10-11 DIAGNOSIS — C7989 Secondary malignant neoplasm of other specified sites: Secondary | ICD-10-CM

## 2016-10-11 DIAGNOSIS — I10 Essential (primary) hypertension: Secondary | ICD-10-CM

## 2016-10-11 DIAGNOSIS — Z923 Personal history of irradiation: Secondary | ICD-10-CM

## 2016-10-11 DIAGNOSIS — Z86718 Personal history of other venous thrombosis and embolism: Secondary | ICD-10-CM

## 2016-10-11 DIAGNOSIS — Z8673 Personal history of transient ischemic attack (TIA), and cerebral infarction without residual deficits: Secondary | ICD-10-CM

## 2016-10-11 DIAGNOSIS — Z7901 Long term (current) use of anticoagulants: Secondary | ICD-10-CM

## 2016-10-11 DIAGNOSIS — J449 Chronic obstructive pulmonary disease, unspecified: Secondary | ICD-10-CM

## 2016-10-11 DIAGNOSIS — Z79899 Other long term (current) drug therapy: Secondary | ICD-10-CM

## 2016-10-11 DIAGNOSIS — R590 Localized enlarged lymph nodes: Secondary | ICD-10-CM

## 2016-10-11 DIAGNOSIS — Z7984 Long term (current) use of oral hypoglycemic drugs: Secondary | ICD-10-CM

## 2016-10-11 LAB — CBC WITH DIFFERENTIAL/PLATELET
BASOS ABS: 0.1 10*3/uL (ref 0–0.1)
BASOS PCT: 1 %
Eosinophils Absolute: 0.3 10*3/uL (ref 0–0.7)
Eosinophils Relative: 5 %
HEMATOCRIT: 34.5 % — AB (ref 35.0–47.0)
HEMOGLOBIN: 11.5 g/dL — AB (ref 12.0–16.0)
Lymphocytes Relative: 29 %
Lymphs Abs: 2 10*3/uL (ref 1.0–3.6)
MCH: 26.4 pg (ref 26.0–34.0)
MCHC: 33.2 g/dL (ref 32.0–36.0)
MCV: 79.4 fL — ABNORMAL LOW (ref 80.0–100.0)
MONO ABS: 0.6 10*3/uL (ref 0.2–0.9)
Monocytes Relative: 9 %
NEUTROS ABS: 4 10*3/uL (ref 1.4–6.5)
NEUTROS PCT: 56 %
Platelets: 244 10*3/uL (ref 150–440)
RBC: 4.34 MIL/uL (ref 3.80–5.20)
RDW: 16.3 % — AB (ref 11.5–14.5)
WBC: 7.1 10*3/uL (ref 3.6–11.0)

## 2016-10-11 LAB — COMPREHENSIVE METABOLIC PANEL
ALBUMIN: 4.1 g/dL (ref 3.5–5.0)
ALT: 19 U/L (ref 14–54)
AST: 28 U/L (ref 15–41)
Alkaline Phosphatase: 58 U/L (ref 38–126)
Anion gap: 9 (ref 5–15)
BUN: 16 mg/dL (ref 6–20)
CHLORIDE: 102 mmol/L (ref 101–111)
CO2: 24 mmol/L (ref 22–32)
CREATININE: 0.89 mg/dL (ref 0.44–1.00)
Calcium: 9 mg/dL (ref 8.9–10.3)
GFR calc Af Amer: >60 mL/min (ref >60)
GFR, EST NON AFRICAN AMERICAN: 56 mL/min — AB (ref >60)
GLUCOSE: 157 mg/dL — AB (ref 65–99)
Potassium: 4 mmol/L (ref 3.5–5.1)
Sodium: 135 mmol/L (ref 135–145)
Total Bilirubin: 0.5 mg/dL (ref 0.3–1.2)
Total Protein: 6.8 g/dL (ref 6.5–8.1)

## 2016-10-11 MED ORDER — PEMBROLIZUMAB CHEMO INJECTION 100 MG/4ML
200.0000 mg | Freq: Once | INTRAVENOUS | Status: AC
  Administered 2016-10-11: 200 mg via INTRAVENOUS
  Filled 2016-10-11: qty 8

## 2016-10-11 MED ORDER — SODIUM CHLORIDE 0.9 % IV SOLN
Freq: Once | INTRAVENOUS | Status: AC
  Administered 2016-10-11: 15:00:00 via INTRAVENOUS
  Filled 2016-10-11: qty 1000

## 2016-10-11 NOTE — Progress Notes (Signed)
Nutrition Assessment   Reason for Assessment:   Patient identified on Malnutrition Screening Report.  ASSESSMENT:  82 year old female with metastatic sarcoma with left thigh mass and bilateral lung nodules. Patient has finished radiation and currently taking [place].   Past medical history of afib, COPD, DM, HLD, HTN, stroke.  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  Reports that she has voiced concerns to management.  Reports often she eats breakfast in apartment. This am had 2 eggs, bacon, biscuits, coffee and fruit.  Some mornings eats cereal. For lunch typically has soup or sandwich, fruit, then dinner maybe frozen meal or goes to dinning room.  Does not like ensure/boost drinks.  Has had some issues with low iron.  Reports fairly stable weight over the last year.      Medications: lasix, glipizide, metformin, B complex vitamins  Labs: Na 127, glucose 104, Hgb 11.5, ferritin 9  Anthropometrics:   Height: 64 inches Weight: 153 lb 15.9 oz BMI: 26   Estimated Energy Needs  Kcals: 1750-2100 kcals/d Protein: 70-84 g/d Fluid: 2.1 L/d  NUTRITION DIAGNOSIS: none at this time    INTERVENTION:   Discussed importance of continued good nutrition to maintain weight Discussed foods rich in iron.  Patient familiar with food choices and does not need further education at this time.   Discussed benefits of ensure/boost.  Pt reports she has tried them but does not like them.      MONITORING, EVALUATION, GOAL: Patient to continue to eat well balanced diet to maintain weight and muscle mass.   NEXT VISIT: as needed   Monica [name] B. Zenia Resides, [place], [place] (pager)

## 2016-10-11 NOTE — Assessment & Plan Note (Signed)
Left thigh mass- along with left pelvic adenopathy; and bilateral lung nodules- biopsy high-grade sarcoma [more likely] versus carcinoma [less likely]. S/p RT to left thigh- with clinical improvement. Patient currently on Keytruda [ High PDL-1 expression].  #  Proceed with cycle# 2 today.  Patient tolerating treatment well except for fatigue.  # Hyponatremia- sodium 135; off HCTZ. Improved.   # Left LE swelling ? Sec to Lymphedema; recommend leg elevation.   # A/fib/ LLE DVT- started back on coumadin by PCP.  # Hx of GI bleed- on Coumadin- re-started back on coumadin by PCP. Hb stable around 11.5/ check Iron studies/ ferrtin today.   # follow up in 3 weeks/labs/ infusion.; PET scan prior.

## 2016-10-11 NOTE — Progress Notes (Signed)
[facility] [facility] NOTE  Patient Care Team: Allyne Gee, MD as PCP - General (Internal Medicine) Seeplaputhur Robinette Haines, MD (General Surgery) [name], NP as Nurse Practitioner (Family Medicine) Lavera Guise, MD (Internal Medicine)  CHIEF COMPLAINTS/PURPOSE OF CONSULTATION: Malignancy of the thigh.   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 PET- Avid thigh mass/? Tumor thrombus; Bil avid lung nodules; Left pelvic LN;    # Nov 6th- START KEYTRUDA q 3W  # s/p RT to left thigh [finished oct 21st 2017]  # MOLECULAR TEST- F-ONE-PDL-1 HIGH [>50% cell]  # Hx of lung nodules [Dr.Khan]   # A.fib on coumadin; ? LLE DVT [? 6-7 years ago]; Smoker [quit >20 y]     Carcinoma of unknown primary ([place])   08/02/2016 Initial Diagnosis    Carcinoma of unknown primary ([place])      HISTORY OF PRESENTING ILLNESS:  Monica [name] 82 y.o.  female noted to have a mass in the left medial thigh- With pelvic adenopathy and bilateral lung nodules- likely sarcoma currently s/p radiation is here for follow-up. Patient currently status post Keytruda approximately 3 weeks ago.  She tolerated treatment well.  Patient notes she has significant improvement of her left thigh mass. Not completely resolved. Significant improvement in pain. Denies any blood in stools or black stools. No nausea no vomiting. No diarrhea.  Currently denies any blood in stools. Denies any significant pain.   ROS: A complete 10 point review of system is done which is negative except mentioned above in history of present illness  MEDICAL HISTORY:  Past Medical History:  Diagnosis Date  . Atrial fibrillation ([place])   . Bronchitis   . Cancer ([place])    Left leg growth, kidneys, lungs and breasts  . Carcinoma of unknown primary ([place])   . COPD (chronic obstructive pulmonary disease) ([place])   . Diabetes mellitus without complication  ([place])   . Hyperlipidemia   . Hypertension   . Pancreatitis   . Pneumonia   . Stroke ([place])   . Vitamin B12 deficiency     SURGICAL HISTORY: Past Surgical History:  Procedure Laterality Date  . ABDOMINAL HYSTERECTOMY  1975  . BLADDER SUSPENSION  1989  . 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 She walks with cane and a walker. Social History   Social History  . Marital status: Divorced    Spouse name: N/A  . Number of children: N/A  . Years of education: N/A   Occupational History  . Not on file.   Social History Main Topics  . Smoking status: Former Smoker    Packs/day: 0.50    Years: 15.00    Types: Cigarettes    Quit date: 11/15/1988  . Smokeless tobacco: Never Used  . Alcohol use No  . Drug use: No  . Sexual activity: Not on file     Comment: hysterectomy    Other Topics Concern  . Not on file   Social History Narrative  . No narrative on file    FAMILY HISTORY: brother- colon-70s.. Sister- kidney cancer 52s.  No family history on file.  ALLERGIES:  is allergic to sulfa antibiotics; celecoxib; acetaminophen; codeine; penicillin g; and petrolatum-zinc oxide.  MEDICATIONS:  Current Outpatient Prescriptions  Medication Sig Dispense Refill  . albuterol (  PROVENTIL HFA;VENTOLIN HFA) 108 (90 Base) MCG/ACT inhaler Inhale 1 puff into the lungs every 4 (four) hours as needed for wheezing or shortness of breath.    Marland Kitchen amLODipine-benazepril (LOTREL) 5-20 MG capsule TAKE 1 CAPSULE BY MOUTH EVERY DAY 90 capsule 0  . b complex vitamins capsule Take 1 capsule by mouth daily.    . famotidine (PEPCID) 20 MG tablet Take 1 tablet (20 mg total) by mouth 2 (two) times daily. 30 tablet 0  . Fluticasone Furoate-Vilanterol 100-25 MCG/INH AEPB Inhale into the lungs.    . furosemide (LASIX) 20 MG tablet     . gabapentin (NEURONTIN) 300 MG capsule TAKE 2 CAPSULES BY MOUTH AT BEDTIME 60 capsule 5  .  glipiZIDE (GLIPIZIDE XL) 2.5 MG 24 hr tablet Take 1 tablet (2.5 mg total) by mouth daily. PATIENT NEEDS TO SCHEDULE OFFICE VISIT FOR FOLLOW UP 90 tablet 0  . hydrochlorothiazide (HYDRODIURIL) 25 MG tablet Take 25 mg by mouth daily.    . metFORMIN (GLUCOPHAGE) 500 MG tablet TAKE ONE TABLET BY MOUTH TWICE DAILY 180 tablet 0  . montelukast (SINGULAIR) 10 MG tablet Take 10 mg by mouth at bedtime.    Marland Kitchen oxybutynin (DITROPAN-XL) 10 MG 24 hr tablet TAKE ONE TABLET EVERY DAY 90 tablet 3  . pravastatin (PRAVACHOL) 40 MG tablet Take 40 mg by mouth daily.    . theophylline (THEO-24) 100 MG 24 hr capsule Take 100 mg by mouth daily.    Marland Kitchen Umeclidinium-Vilanterol (ANORO ELLIPTA) 62.5-25 MCG/INH AEPB     . warfarin (COUMADIN) 4 MG tablet 2 mg.      No current facility-administered medications for this visit.       Marland Kitchen  PHYSICAL EXAMINATION: ECOG PERFORMANCE STATUS: 1 - Symptomatic but completely ambulatory  Vitals:   10/11/16 1403  BP: (!) 145/78  Pulse: 75  Temp: 98 F (36.7 C)   Filed Weights   10/11/16 1403  Weight: 155 lb (70.3 kg)    GENERAL: Well-nourished well-developed; Alert, no distress and comfortable. She is walking with a cane. She appears much younger than her stated age.. Accompanied by 1 daughter.  EYES: no pallor or icterus OROPHARYNX: no thrush or ulceration; good dentition  NECK: supple, no masses felt LYMPH:  no palpable lymphadenopathy in the cervical, axillary or inguinal regions LUNGS: clear to auscultation and  No wheeze or crackles HEART/CVS: regular rate & rhythm and no murmurs; No lower extremity edema; left medial thigh ~ 6 cm mass noted/ improved. ABDOMEN: abdomen soft, non-tender and normal bowel sounds Musculoskeletal:no cyanosis of digits and no clubbing  PSYCH: alert & oriented x 3 with fluent speech NEURO: no focal motor/sensory deficits SKIN:  no rashes or significant lesions  LABORATORY DATA:  I have reviewed the data as listed Lab Results  Component  Value Date   WBC 7.1 10/11/2016   HGB 11.5 (L) 10/11/2016   HCT 34.5 (L) 10/11/2016   MCV 79.4 (L) 10/11/2016   PLT 244 10/11/2016    Recent Labs  08/08/16 0706  08/10/16 0422 09/20/16 1300 10/11/16 1325  NA 129*  < > 137 127* 135  K 4.5  < > 3.6 4.1 4.0  CL 96*  < > 108 93* 102  CO2 26  < > _0 GLUCOSE 166*  < > 134* 104* 157*  BUN 18  < > _1 CREATININE 0.88  < > 0.74 0.87 0.89  CALCIUM 8.7*  < > 8.3* 9.4 9.0  GFRNONAA 57*  < > >  60 58* 56*  GFRAA >60  < > >60 >60 >60  PROT 6.1*  --   --  7.1 6.8  ALBUMIN 3.3*  --   --  3.9 4.1  AST 22  --   --  24 28  ALT 14  --   --  14 19  ALKPHOS 66  --   --  78 58  BILITOT 0.1*  --   --  0.4 0.5  < > = values in this interval not displayed.  RADIOGRAPHIC STUDIES: I have personally reviewed the radiological images as listed and agreed with the findings in the report. No results found.  ASSESSMENT & PLAN:   Carcinoma of unknown primary ([place]) Left thigh mass- along with left pelvic adenopathy; and bilateral lung nodules- biopsy high-grade sarcoma [more likely] versus carcinoma [less likely]. S/p RT to left thigh- with clinical improvement. Patient currently on Keytruda [ High PDL-1 expression].  #  Proceed with cycle# 2 today.  Patient tolerating treatment well except for fatigue.  # Hyponatremia- sodium 135; off HCTZ. Improved.   # Left LE swelling ? Sec to Lymphedema; recommend leg elevation.   # A/fib/ LLE DVT- started back on coumadin by PCP.  # Hx of GI bleed- on Coumadin- re-started back on coumadin by PCP. Hb stable around 11.5/ check Iron studies/ ferrtin today.   # follow up in 3 weeks/labs/ infusion.; PET scan prior.     Cammie Sickle, MD 10/12/2016 10:51 AM

## 2016-10-13 ENCOUNTER — Inpatient Hospital Stay: Payer: Medicare Other

## 2016-10-13 VITALS — BP 149/78 | HR 65 | Temp 98.6°F | Resp 18

## 2016-10-13 DIAGNOSIS — C801 Malignant (primary) neoplasm, unspecified: Secondary | ICD-10-CM

## 2016-10-13 DIAGNOSIS — N390 Urinary tract infection, site not specified: Secondary | ICD-10-CM | POA: Diagnosis not present

## 2016-10-13 DIAGNOSIS — D500 Iron deficiency anemia secondary to blood loss (chronic): Secondary | ICD-10-CM

## 2016-10-13 DIAGNOSIS — C7989 Secondary malignant neoplasm of other specified sites: Secondary | ICD-10-CM | POA: Diagnosis not present

## 2016-10-13 DIAGNOSIS — I890 Lymphedema, not elsewhere classified: Secondary | ICD-10-CM | POA: Diagnosis not present

## 2016-10-13 DIAGNOSIS — E871 Hypo-osmolality and hyponatremia: Secondary | ICD-10-CM | POA: Diagnosis not present

## 2016-10-13 DIAGNOSIS — Z5112 Encounter for antineoplastic immunotherapy: Secondary | ICD-10-CM | POA: Diagnosis not present

## 2016-10-13 MED ORDER — SODIUM CHLORIDE 0.9 % IV SOLN: 200.0000 mg | Freq: Once | INTRAVENOUS | Status: DC

## 2016-10-13 MED ORDER — IRON SUCROSE 20 MG/ML IV SOLN
200.0000 mg | Freq: Once | INTRAVENOUS | Status: AC
  Administered 2016-10-13: 200 mg via INTRAVENOUS
  Filled 2016-10-13: qty 10

## 2016-10-20 ENCOUNTER — Inpatient Hospital Stay: Payer: Medicare Other | Attending: Internal Medicine

## 2016-10-20 VITALS — BP 147/77 | HR 75 | Temp 97.3°F | Resp 20

## 2016-10-20 DIAGNOSIS — I4891 Unspecified atrial fibrillation: Secondary | ICD-10-CM | POA: Diagnosis not present

## 2016-10-20 DIAGNOSIS — Z8719 Personal history of other diseases of the digestive system: Secondary | ICD-10-CM | POA: Diagnosis not present

## 2016-10-20 DIAGNOSIS — Z7901 Long term (current) use of anticoagulants: Secondary | ICD-10-CM | POA: Diagnosis not present

## 2016-10-20 DIAGNOSIS — Z87891 Personal history of nicotine dependence: Secondary | ICD-10-CM | POA: Diagnosis not present

## 2016-10-20 DIAGNOSIS — Z86718 Personal history of other venous thrombosis and embolism: Secondary | ICD-10-CM | POA: Insufficient documentation

## 2016-10-20 DIAGNOSIS — C7989 Secondary malignant neoplasm of other specified sites: Secondary | ICD-10-CM | POA: Insufficient documentation

## 2016-10-20 DIAGNOSIS — R918 Other nonspecific abnormal finding of lung field: Secondary | ICD-10-CM | POA: Diagnosis not present

## 2016-10-20 DIAGNOSIS — Z5112 Encounter for antineoplastic immunotherapy: Secondary | ICD-10-CM | POA: Diagnosis not present

## 2016-10-20 DIAGNOSIS — D500 Iron deficiency anemia secondary to blood loss (chronic): Secondary | ICD-10-CM

## 2016-10-20 DIAGNOSIS — Z7984 Long term (current) use of oral hypoglycemic drugs: Secondary | ICD-10-CM | POA: Diagnosis not present

## 2016-10-20 DIAGNOSIS — Z8051 Family history of malignant neoplasm of kidney: Secondary | ICD-10-CM | POA: Insufficient documentation

## 2016-10-20 DIAGNOSIS — Z79899 Other long term (current) drug therapy: Secondary | ICD-10-CM | POA: Insufficient documentation

## 2016-10-20 DIAGNOSIS — E785 Hyperlipidemia, unspecified: Secondary | ICD-10-CM | POA: Insufficient documentation

## 2016-10-20 DIAGNOSIS — I10 Essential (primary) hypertension: Secondary | ICD-10-CM | POA: Diagnosis not present

## 2016-10-20 DIAGNOSIS — Z8701 Personal history of pneumonia (recurrent): Secondary | ICD-10-CM | POA: Insufficient documentation

## 2016-10-20 DIAGNOSIS — Z800 Family history of malignant neoplasm of digestive organs: Secondary | ICD-10-CM | POA: Insufficient documentation

## 2016-10-20 DIAGNOSIS — Z923 Personal history of irradiation: Secondary | ICD-10-CM | POA: Insufficient documentation

## 2016-10-20 DIAGNOSIS — E538 Deficiency of other specified B group vitamins: Secondary | ICD-10-CM | POA: Insufficient documentation

## 2016-10-20 DIAGNOSIS — Z8673 Personal history of transient ischemic attack (TIA), and cerebral infarction without residual deficits: Secondary | ICD-10-CM | POA: Insufficient documentation

## 2016-10-20 DIAGNOSIS — E119 Type 2 diabetes mellitus without complications: Secondary | ICD-10-CM | POA: Insufficient documentation

## 2016-10-20 DIAGNOSIS — R591 Generalized enlarged lymph nodes: Secondary | ICD-10-CM | POA: Insufficient documentation

## 2016-10-20 DIAGNOSIS — C801 Malignant (primary) neoplasm, unspecified: Secondary | ICD-10-CM | POA: Insufficient documentation

## 2016-10-20 DIAGNOSIS — R5383 Other fatigue: Secondary | ICD-10-CM | POA: Insufficient documentation

## 2016-10-20 DIAGNOSIS — J449 Chronic obstructive pulmonary disease, unspecified: Secondary | ICD-10-CM | POA: Diagnosis not present

## 2016-10-20 MED ORDER — IRON SUCROSE 20 MG/ML IV SOLN
200.0000 mg | Freq: Once | INTRAVENOUS | Status: AC
  Administered 2016-10-20: 200 mg via INTRAVENOUS
  Filled 2016-10-20: qty 10

## 2016-10-20 MED ORDER — SODIUM CHLORIDE 0.9 % IV SOLN
Freq: Once | INTRAVENOUS | Status: AC
  Administered 2016-10-20: 14:00:00 via INTRAVENOUS
  Filled 2016-10-20: qty 1000

## 2016-10-20 MED ORDER — SODIUM CHLORIDE 0.9 % IV SOLN: 200.0000 mg | Freq: Once | INTRAVENOUS | Status: DC

## 2016-10-26 DIAGNOSIS — I482 Chronic atrial fibrillation: Secondary | ICD-10-CM | POA: Diagnosis not present

## 2016-10-26 DIAGNOSIS — N390 Urinary tract infection, site not specified: Secondary | ICD-10-CM | POA: Diagnosis not present

## 2016-10-26 DIAGNOSIS — I10 Essential (primary) hypertension: Secondary | ICD-10-CM | POA: Diagnosis not present

## 2016-10-26 DIAGNOSIS — Z7901 Long term (current) use of anticoagulants: Secondary | ICD-10-CM | POA: Diagnosis not present

## 2016-10-26 DIAGNOSIS — E119 Type 2 diabetes mellitus without complications: Secondary | ICD-10-CM | POA: Diagnosis not present

## 2016-10-26 DIAGNOSIS — J449 Chronic obstructive pulmonary disease, unspecified: Secondary | ICD-10-CM | POA: Diagnosis not present

## 2016-10-26 DIAGNOSIS — C801 Malignant (primary) neoplasm, unspecified: Secondary | ICD-10-CM | POA: Diagnosis not present

## 2016-10-28 ENCOUNTER — Other Ambulatory Visit: Payer: Self-pay | Admitting: Internal Medicine

## 2016-10-28 ENCOUNTER — Ambulatory Visit
Admission: RE | Admit: 2016-10-28 | Discharge: 2016-10-28 | Disposition: A | Discharge status: Home / Self Care | Payer: Medicare Other | Source: Ambulatory Visit | Attending: Internal Medicine | Admitting: Internal Medicine

## 2016-10-28 DIAGNOSIS — C801 Malignant (primary) neoplasm, unspecified: Secondary | ICD-10-CM | POA: Insufficient documentation

## 2016-10-28 DIAGNOSIS — R918 Other nonspecific abnormal finding of lung field: Secondary | ICD-10-CM | POA: Diagnosis not present

## 2016-10-28 LAB — GLUCOSE, CAPILLARY: GLUCOSE-CAPILLARY: 141 mg/dL — AB (ref 65–99)

## 2016-10-28 MED ORDER — FLUDEOXYGLUCOSE F - 18 (FDG) INJECTION
12.0000 | Freq: Once | INTRAVENOUS | Status: AC | PRN
  Administered 2016-10-28: 13.117 via INTRAVENOUS

## 2016-11-01 ENCOUNTER — Inpatient Hospital Stay: Payer: Medicare Other

## 2016-11-01 ENCOUNTER — Inpatient Hospital Stay (HOSPITAL_BASED_OUTPATIENT_CLINIC_OR_DEPARTMENT_OTHER): Payer: Medicare Other | Admitting: Internal Medicine

## 2016-11-01 VITALS — BP 131/74 | HR 74 | Temp 97.8°F | Resp 18 | Wt 153.0 lb

## 2016-11-01 DIAGNOSIS — C801 Malignant (primary) neoplasm, unspecified: Secondary | ICD-10-CM

## 2016-11-01 DIAGNOSIS — C7989 Secondary malignant neoplasm of other specified sites: Secondary | ICD-10-CM

## 2016-11-01 DIAGNOSIS — J449 Chronic obstructive pulmonary disease, unspecified: Secondary | ICD-10-CM

## 2016-11-01 DIAGNOSIS — Z800 Family history of malignant neoplasm of digestive organs: Secondary | ICD-10-CM

## 2016-11-01 DIAGNOSIS — Z86718 Personal history of other venous thrombosis and embolism: Secondary | ICD-10-CM

## 2016-11-01 DIAGNOSIS — Z87891 Personal history of nicotine dependence: Secondary | ICD-10-CM

## 2016-11-01 DIAGNOSIS — E785 Hyperlipidemia, unspecified: Secondary | ICD-10-CM

## 2016-11-01 DIAGNOSIS — R918 Other nonspecific abnormal finding of lung field: Secondary | ICD-10-CM

## 2016-11-01 DIAGNOSIS — Z5112 Encounter for antineoplastic immunotherapy: Secondary | ICD-10-CM | POA: Diagnosis not present

## 2016-11-01 DIAGNOSIS — Z7984 Long term (current) use of oral hypoglycemic drugs: Secondary | ICD-10-CM

## 2016-11-01 DIAGNOSIS — I10 Essential (primary) hypertension: Secondary | ICD-10-CM

## 2016-11-01 DIAGNOSIS — R591 Generalized enlarged lymph nodes: Secondary | ICD-10-CM

## 2016-11-01 DIAGNOSIS — E119 Type 2 diabetes mellitus without complications: Secondary | ICD-10-CM

## 2016-11-01 DIAGNOSIS — Z79899 Other long term (current) drug therapy: Secondary | ICD-10-CM

## 2016-11-01 DIAGNOSIS — Z8051 Family history of malignant neoplasm of kidney: Secondary | ICD-10-CM

## 2016-11-01 DIAGNOSIS — Z8719 Personal history of other diseases of the digestive system: Secondary | ICD-10-CM

## 2016-11-01 DIAGNOSIS — Z8673 Personal history of transient ischemic attack (TIA), and cerebral infarction without residual deficits: Secondary | ICD-10-CM

## 2016-11-01 DIAGNOSIS — Z8701 Personal history of pneumonia (recurrent): Secondary | ICD-10-CM

## 2016-11-01 DIAGNOSIS — R5383 Other fatigue: Secondary | ICD-10-CM | POA: Diagnosis not present

## 2016-11-01 DIAGNOSIS — I4891 Unspecified atrial fibrillation: Secondary | ICD-10-CM

## 2016-11-01 DIAGNOSIS — Z7901 Long term (current) use of anticoagulants: Secondary | ICD-10-CM

## 2016-11-01 DIAGNOSIS — E538 Deficiency of other specified B group vitamins: Secondary | ICD-10-CM

## 2016-11-01 DIAGNOSIS — Z923 Personal history of irradiation: Secondary | ICD-10-CM

## 2016-11-01 LAB — COMPREHENSIVE METABOLIC PANEL
ALT: 20 U/L (ref 14–54)
AST: 26 U/L (ref 15–41)
Albumin: 4.1 g/dL (ref 3.5–5.0)
Alkaline Phosphatase: 77 U/L (ref 38–126)
Anion gap: 9 (ref 5–15)
BUN: 20 mg/dL (ref 6–20)
CHLORIDE: 103 mmol/L (ref 101–111)
CO2: 25 mmol/L (ref 22–32)
CREATININE: 0.92 mg/dL (ref 0.44–1.00)
Calcium: 9.3 mg/dL (ref 8.9–10.3)
GFR, EST NON AFRICAN AMERICAN: 54 mL/min — AB (ref >60)
Glucose, Bld: 97 mg/dL (ref 65–99)
POTASSIUM: 4.5 mmol/L (ref 3.5–5.1)
Sodium: 137 mmol/L (ref 135–145)
Total Bilirubin: 0.6 mg/dL (ref 0.3–1.2)
Total Protein: 7 g/dL (ref 6.5–8.1)

## 2016-11-01 LAB — CBC WITH DIFFERENTIAL/PLATELET
Basophils Absolute: 0.1 10*3/uL (ref 0–0.1)
Basophils Relative: 1 %
EOS PCT: 3 %
Eosinophils Absolute: 0.3 10*3/uL (ref 0–0.7)
HCT: 37.1 % (ref 35.0–47.0)
Hemoglobin: 12.4 g/dL (ref 12.0–16.0)
LYMPHS ABS: 2.1 10*3/uL (ref 1.0–3.6)
LYMPHS PCT: 25 %
MCH: 27 pg (ref 26.0–34.0)
MCHC: 33.4 g/dL (ref 32.0–36.0)
MCV: 80.9 fL (ref 80.0–100.0)
MONO ABS: 0.7 10*3/uL (ref 0.2–0.9)
Monocytes Relative: 9 %
Neutro Abs: 5.1 10*3/uL (ref 1.4–6.5)
Neutrophils Relative %: 62 %
PLATELETS: 239 10*3/uL (ref 150–440)
RBC: 4.58 MIL/uL (ref 3.80–5.20)
RDW: 19.1 % — AB (ref 11.5–14.5)
WBC: 8.2 10*3/uL (ref 3.6–11.0)

## 2016-11-01 MED ORDER — SODIUM CHLORIDE 0.9 % IV SOLN
200.0000 mg | Freq: Once | INTRAVENOUS | Status: AC
  Administered 2016-11-01: 200 mg via INTRAVENOUS
  Filled 2016-11-01: qty 8

## 2016-11-01 MED ORDER — SODIUM CHLORIDE 0.9 % IV SOLN
Freq: Once | INTRAVENOUS | Status: AC
  Administered 2016-11-01: 15:00:00 via INTRAVENOUS
  Filled 2016-11-01: qty 1000

## 2016-11-01 NOTE — Progress Notes (Signed)
Patient here today for follow up Lung Cancer

## 2016-11-01 NOTE — Assessment & Plan Note (Addendum)
Left thigh mass- along with left pelvic adenopathy; and bilateral lung nodules- STAGE IV-  biopsy high-grade sarcoma [more likely] versus carcinoma [less likely]. S/p RT to left thigh- with clinical improvement. Patient currently on Keytruda [ High PDL-1 expression].  #  S/p 2 cycles of Keytruda- PET scan- significant improvement of the left thigh mass; with 2 new left lower lung nodules. Discussed that we'll repeat the scan again in 3 months or so.  #  Proceed with cycle# 3 today.  Patient tolerating treatment well except for fatigue.  # A/fib/ LLE DVT- started back on coumadin.   # Hx of GI bleed- on Coumadin- re-started back on coumadin by PCP. Hb ~12 s/p IV iron.   # Body aches- as per pt- ? Etiology [pt attributes to PET scan]. Okay with aleeve for short period of time.   # follow up in 3 weeks/labs/ infusion.  # I reviewed the blood work- with the patient in detail; also reviewed the imaging independently [as summarized above]; and with the patient in detail.

## 2016-11-01 NOTE — Progress Notes (Signed)
[facility] NOTE  Patient Care Team: Allyne Gee, MD as PCP - General (Internal Medicine) Seeplaputhur Robinette Haines, MD (General Surgery) [name], NP as Nurse Practitioner (Family Medicine) Lavera Guise, MD (Internal Medicine)  CHIEF COMPLAINTS/PURPOSE OF CONSULTATION: Malignancy of the thigh.   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 PET- Avid thigh mass/? Tumor thrombus; Bil avid lung nodules; Left pelvic LN;    # Nov 6th- START KEYTRUDA q 3W x2; DEC 4th PET- Improvement   # s/p RT to left thigh [finished oct 21st 2017]  # MOLECULAR TEST- F-ONE-PDL-1 HIGH [>50% cell]  # Hx of lung nodules [Dr.Khan]   # A.fib on coumadin; ? LLE DVT [? 6-7 years ago]; Smoker [quit >20 y]     Carcinoma of unknown primary ([place])   08/02/2016 Initial Diagnosis    Carcinoma of unknown primary ([place])      HISTORY OF PRESENTING ILLNESS:  Monica [name] 82 y.o.  female noted to have a mass in the left medial thigh- With pelvic adenopathy and bilateral lung nodules- likely sarcoma currently s/p radiation is here for follow-up. Patient currently status post Keytruda Cycle #2 approximately 3 weeks ago; she is also here to review the results of the restaging PET scan.  She states to have increasing joint pains and body aches after having the PET scan. She states that she is allergic to Tylenol. She has been taking Aleve for pain control.  Patient notes she has significant improvement of her left thigh mass. Not completely resolved. Significant improvement in pain. Denies any blood in stools or black stools. No nausea no vomiting. No diarrhea. Patient is walking herself.  Currently denies any blood in stools. Denies any significant pain.   ROS: A complete 10 point review of system is done which is negative except mentioned above in history of present illness  MEDICAL HISTORY:  Past Medical  History:  Diagnosis Date  . Atrial fibrillation ([place])   . Bronchitis   . Cancer ([place])    Left leg growth, kidneys, lungs and breasts  . Carcinoma of unknown primary ([place])   . COPD (chronic obstructive pulmonary disease) ([place])   . Diabetes mellitus without complication ([place])   . Hyperlipidemia   . Hypertension   . Pancreatitis   . Pneumonia   . Stroke ([place])   . Vitamin B12 deficiency     SURGICAL HISTORY: Past Surgical History:  Procedure Laterality Date  . ABDOMINAL HYSTERECTOMY  1975  . BLADDER SUSPENSION  1989  . 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 She walks with cane and a walker. Social History   Social History  . Marital status: Divorced    Spouse name: N/A  . Number of children: N/A  . Years of education: N/A   Occupational History  . Not on file.   Social History Main Topics  . Smoking status: Former Smoker    Packs/day: 0.50    Years: 15.00    Types: Cigarettes    Quit date: 11/15/1988  . Smokeless tobacco: Never Used  . Alcohol use No  . Drug use: No  . Sexual activity: Not on file     Comment: hysterectomy    Other Topics Concern  . Not on file   Social History Narrative  .  No narrative on file    FAMILY HISTORY: brother- colon-70s.. Sister- kidney cancer 32s.  No family history on file.  ALLERGIES:  is allergic to sulfa antibiotics; celecoxib; acetaminophen; codeine; penicillin g; and petrolatum-zinc oxide.  MEDICATIONS:  Current Outpatient Prescriptions  Medication Sig Dispense Refill  . albuterol (PROVENTIL HFA;VENTOLIN HFA) 108 (90 Base) MCG/ACT inhaler Inhale 1 puff into the lungs every 4 (four) hours as needed for wheezing or shortness of breath.    Marland Kitchen amLODipine-benazepril (LOTREL) 5-20 MG capsule TAKE 1 CAPSULE BY MOUTH EVERY DAY 90 capsule 0  . b complex vitamins capsule Take 1 capsule by mouth daily.    . famotidine (PEPCID) 20 MG tablet Take 1  tablet (20 mg total) by mouth 2 (two) times daily. 30 tablet 0  . Fluticasone Furoate-Vilanterol 100-25 MCG/INH AEPB Inhale into the lungs.    . furosemide (LASIX) 20 MG tablet     . gabapentin (NEURONTIN) 300 MG capsule TAKE 2 CAPSULES BY MOUTH AT BEDTIME 60 capsule 5  . glipiZIDE (GLIPIZIDE XL) 2.5 MG 24 hr tablet Take 1 tablet (2.5 mg total) by mouth daily. PATIENT NEEDS TO SCHEDULE OFFICE VISIT FOR FOLLOW UP 90 tablet 0  . hydrochlorothiazide (HYDRODIURIL) 25 MG tablet Take 25 mg by mouth daily.    Marland Kitchen loratadine (CLARITIN) 10 MG tablet Take 10 mg by mouth daily.    . metFORMIN (GLUCOPHAGE) 500 MG tablet TAKE ONE TABLET BY MOUTH TWICE DAILY 180 tablet 0  . montelukast (SINGULAIR) 10 MG tablet Take 10 mg by mouth at bedtime.    Marland Kitchen oxybutynin (DITROPAN-XL) 10 MG 24 hr tablet TAKE ONE TABLET EVERY DAY 90 tablet 3  . pravastatin (PRAVACHOL) 40 MG tablet Take 40 mg by mouth daily.    . theophylline (THEO-24) 100 MG 24 hr capsule Take 100 mg by mouth daily.    Marland Kitchen Umeclidinium-Vilanterol (ANORO ELLIPTA) 62.5-25 MCG/INH AEPB     . warfarin (COUMADIN) 4 MG tablet 2 mg.      No current facility-administered medications for this visit.       Marland Kitchen  PHYSICAL EXAMINATION: ECOG PERFORMANCE STATUS: 1 - Symptomatic but completely ambulatory  Vitals:   11/01/16 1408  BP: 131/74  Pulse: 74  Resp: 18  Temp: 97.8 F (36.6 C)   Filed Weights   11/01/16 1408  Weight: 153 lb (69.4 kg)    GENERAL: Well-nourished well-developed; Alert, no distress and comfortable. She is walking with a cane. She appears much younger than her stated age.. Accompanied by 2 daughter.  EYES: no pallor or icterus OROPHARYNX: no thrush or ulceration; good dentition  NECK: supple, no masses felt LYMPH:  no palpable lymphadenopathy in the cervical, axillary or inguinal regions LUNGS: clear to auscultation and  No wheeze or crackles HEART/CVS: regular rate & rhythm and no murmurs; No lower extremity edema; left medial thigh ~ 6  cm mass noted/ improved. ABDOMEN: abdomen soft, non-tender and normal bowel sounds Musculoskeletal:no cyanosis of digits and no clubbing  PSYCH: alert & oriented x 3 with fluent speech NEURO: no focal motor/sensory deficits SKIN:  no rashes or significant lesions  LABORATORY DATA:  I have reviewed the data as listed Lab Results  Component Value Date   WBC 8.2 11/01/2016   HGB 12.4 11/01/2016   HCT 37.1 11/01/2016   MCV 80.9 11/01/2016   PLT 239 11/01/2016    Recent Labs  09/20/16 1300 10/11/16 1325 11/01/16 1320  NA 127* 135 137  K 4.1 4.0 4.5  CL 93* 102 103  CO2 _0 GLUCOSE 104* 157* 97  BUN _1 CREATININE 0.87 0.89 0.92  CALCIUM 9.4 9.0 9.3  GFRNONAA 58* 56* 54*  GFRAA >60 >60 >60  PROT 7.1 6.8 7.0  ALBUMIN 3.9 4.1 4.1  AST _2 ALT _3 ALKPHOS 78 58 77  BILITOT 0.4 0.5 0.6    RADIOGRAPHIC STUDIES: I have personally reviewed the radiological images as listed and agreed with the findings in the report. Nm Pet Image Restage (ps) Whole Body  Result Date: 10/28/2016 CLINICAL DATA:  Subsequent treatment strategy for carcinoma of unknown primary. Currently on Keytruda immunotherapy EXAM: NUCLEAR MEDICINE PET WHOLE BODY TECHNIQUE: 13.4 mCi F-18 FDG was injected intravenously. Full-ring PET imaging was performed from the vertex to the feet after the radiotracer. CT data was obtained and used for attenuation correction and anatomic localization. FASTING BLOOD GLUCOSE:  Value: 141 Mg/dl COMPARISON:  PET-CT 07/26/2016 FINDINGS: NECK No hypermetabolic lymph nodes in the neck. CHEST RIGHT upper lobe pulmonary nodule measures 8 mm compared to 9 mm. Metabolic activity is resolved. RIGHT upper lobe nodule at the apex measures 5 mm compared to 33m (images 99, series 4). No metabolic activity. 7 mm nodule in the posterior RIGHT upper lobe (image 105, series 4) has mild metabolic activity (SUV max 1.8) not changed from prior. Two LEFT lower lobe nodules are not  seen on comparison exam measuring 8 mm on image 123, series 4 and 8 mm on image 126, series 4. ABDOMEN/PELVIS No abnormal hypermetabolic activity within the liver, pancreas, adrenal glands, or spleen. No hypermetabolic lymph nodes in the abdomen or pelvis. SKELETON No focal hypermetabolic activity to suggest skeletal metastasis. Extremities: The mass in the medial aspect of the LEFT upper leg has central photopenia and a rim of mild metabolic activity with SUV max equal 2.6 decreased from SUV max equal 26. Lesion measures 5.8 x 4.7 cm decreased minimally from 6.8 by 5.7 cm. The metabolic activity associated with the proximal venous structures of the LEFT upper leg is also dramatically reduced with SUV max equal 2.9 in the LEFT inguinal region compared with 10.6. There is still swelling of the LEFT lower extremity compared to the RIGHT. IMPRESSION: 1. Near complete resolution of metabolic activity associated with the large LEFT proximal thigh mass. 2. Near-complete resolution metabolic activity associated with the venous vascular structures of the proximal LEFT lower extremity and pelvis. 3. Decrease in metabolic activity of RIGHT pulmonary nodules. Similar size. 4. Two new pulmonary nodules in the LEFT lower lobe (potential pseudo progression effect of immunotherapy) . Electronically Signed   By: SSuzy BouchardM.D.   On: 10/28/2016 15:36    ASSESSMENT & PLAN:   Carcinoma of unknown primary ([place] Left thigh mass- along with left pelvic adenopathy; and bilateral lung nodules- STAGE IV-  biopsy high-grade sarcoma [more likely] versus carcinoma [less likely]. S/p RT to left thigh- with clinical improvement. Patient currently on Keytruda [ High PDL-1 expression].  #  S/p 2 cycles of Keytruda- PET scan- significant improvement of the left thigh mass; with 2 new left lower lung nodules. Discussed that we'll repeat the scan again in 3 months or so.  #  Proceed with cycle# 3 today.  Patient tolerating treatment  well except for fatigue.  # A/fib/ LLE DVT- started back on coumadin.   # Hx of GI bleed- on Coumadin- re-started back on coumadin by PCP. Hb ~12 s/p IV iron.   # Body aches- as  per pt- ? Etiology [pt attributes to PET scan]. Okay with aleeve for short period of time.   # follow up in 3 weeks/labs/ infusion.  # I reviewed the blood work- with the patient in detail; also reviewed the imaging independently [as summarized above]; and with the patient in detail.     Cammie Sickle, MD 11/01/2016 6:18 PM

## 2016-11-11 DIAGNOSIS — I482 Chronic atrial fibrillation: Secondary | ICD-10-CM | POA: Diagnosis not present

## 2016-11-18 ENCOUNTER — Other Ambulatory Visit: Payer: Self-pay | Admitting: Family Medicine

## 2016-11-22 ENCOUNTER — Inpatient Hospital Stay: Payer: Medicare Other | Attending: Internal Medicine | Admitting: Internal Medicine

## 2016-11-22 ENCOUNTER — Inpatient Hospital Stay: Payer: Medicare Other

## 2016-11-22 VITALS — BP 193/63 | HR 103 | Temp 97.5°F | Wt 155.0 lb

## 2016-11-22 DIAGNOSIS — Z79899 Other long term (current) drug therapy: Secondary | ICD-10-CM | POA: Diagnosis not present

## 2016-11-22 DIAGNOSIS — Z87891 Personal history of nicotine dependence: Secondary | ICD-10-CM | POA: Diagnosis not present

## 2016-11-22 DIAGNOSIS — I4891 Unspecified atrial fibrillation: Secondary | ICD-10-CM | POA: Diagnosis not present

## 2016-11-22 DIAGNOSIS — Z8701 Personal history of pneumonia (recurrent): Secondary | ICD-10-CM | POA: Insufficient documentation

## 2016-11-22 DIAGNOSIS — Z7952 Long term (current) use of systemic steroids: Secondary | ICD-10-CM | POA: Diagnosis not present

## 2016-11-22 DIAGNOSIS — E538 Deficiency of other specified B group vitamins: Secondary | ICD-10-CM | POA: Diagnosis not present

## 2016-11-22 DIAGNOSIS — Z923 Personal history of irradiation: Secondary | ICD-10-CM | POA: Insufficient documentation

## 2016-11-22 DIAGNOSIS — Z9221 Personal history of antineoplastic chemotherapy: Secondary | ICD-10-CM | POA: Diagnosis not present

## 2016-11-22 DIAGNOSIS — M25552 Pain in left hip: Secondary | ICD-10-CM | POA: Diagnosis not present

## 2016-11-22 DIAGNOSIS — Z8719 Personal history of other diseases of the digestive system: Secondary | ICD-10-CM | POA: Insufficient documentation

## 2016-11-22 DIAGNOSIS — C801 Malignant (primary) neoplasm, unspecified: Secondary | ICD-10-CM

## 2016-11-22 DIAGNOSIS — E119 Type 2 diabetes mellitus without complications: Secondary | ICD-10-CM | POA: Insufficient documentation

## 2016-11-22 DIAGNOSIS — Z7984 Long term (current) use of oral hypoglycemic drugs: Secondary | ICD-10-CM | POA: Insufficient documentation

## 2016-11-22 DIAGNOSIS — I10 Essential (primary) hypertension: Secondary | ICD-10-CM | POA: Diagnosis not present

## 2016-11-22 DIAGNOSIS — E1165 Type 2 diabetes mellitus with hyperglycemia: Secondary | ICD-10-CM | POA: Insufficient documentation

## 2016-11-22 DIAGNOSIS — Z8669 Personal history of other diseases of the nervous system and sense organs: Secondary | ICD-10-CM | POA: Diagnosis not present

## 2016-11-22 DIAGNOSIS — E785 Hyperlipidemia, unspecified: Secondary | ICD-10-CM | POA: Diagnosis not present

## 2016-11-22 DIAGNOSIS — Z86718 Personal history of other venous thrombosis and embolism: Secondary | ICD-10-CM

## 2016-11-22 DIAGNOSIS — C7989 Secondary malignant neoplasm of other specified sites: Secondary | ICD-10-CM | POA: Diagnosis not present

## 2016-11-22 DIAGNOSIS — M25551 Pain in right hip: Secondary | ICD-10-CM | POA: Insufficient documentation

## 2016-11-22 DIAGNOSIS — Z7901 Long term (current) use of anticoagulants: Secondary | ICD-10-CM

## 2016-11-22 DIAGNOSIS — J449 Chronic obstructive pulmonary disease, unspecified: Secondary | ICD-10-CM

## 2016-11-22 DIAGNOSIS — Z8051 Family history of malignant neoplasm of kidney: Secondary | ICD-10-CM | POA: Insufficient documentation

## 2016-11-22 LAB — CBC WITH DIFFERENTIAL/PLATELET
BASOS ABS: 0.1 10*3/uL (ref 0–0.1)
BASOS PCT: 1 %
EOS ABS: 0.3 10*3/uL (ref 0–0.7)
EOS PCT: 3 %
HCT: 37.1 % (ref 35.0–47.0)
Hemoglobin: 12.4 g/dL (ref 12.0–16.0)
LYMPHS ABS: 1.7 10*3/uL (ref 1.0–3.6)
Lymphocytes Relative: 16 %
MCH: 27.3 pg (ref 26.0–34.0)
MCHC: 33.6 g/dL (ref 32.0–36.0)
MCV: 81.3 fL (ref 80.0–100.0)
Monocytes Absolute: 0.7 10*3/uL (ref 0.2–0.9)
Monocytes Relative: 7 %
NEUTROS PCT: 73 %
Neutro Abs: 7.8 10*3/uL — ABNORMAL HIGH (ref 1.4–6.5)
PLATELETS: 252 10*3/uL (ref 150–440)
RBC: 4.56 MIL/uL (ref 3.80–5.20)
RDW: 19.2 % — ABNORMAL HIGH (ref 11.5–14.5)
WBC: 10.6 10*3/uL (ref 3.6–11.0)

## 2016-11-22 LAB — COMPREHENSIVE METABOLIC PANEL
ALK PHOS: 81 U/L (ref 38–126)
ALT: 20 U/L (ref 14–54)
AST: 30 U/L (ref 15–41)
Albumin: 3.9 g/dL (ref 3.5–5.0)
Anion gap: 9 (ref 5–15)
BILIRUBIN TOTAL: 0.5 mg/dL (ref 0.3–1.2)
BUN: 18 mg/dL (ref 6–20)
CALCIUM: 9.4 mg/dL (ref 8.9–10.3)
CO2: 23 mmol/L (ref 22–32)
CREATININE: 0.88 mg/dL (ref 0.44–1.00)
Chloride: 102 mmol/L (ref 101–111)
GFR, EST NON AFRICAN AMERICAN: 57 mL/min — AB (ref >60)
Glucose, Bld: 220 mg/dL — ABNORMAL HIGH (ref 65–99)
Potassium: 4 mmol/L (ref 3.5–5.1)
Sodium: 134 mmol/L — ABNORMAL LOW (ref 135–145)
TOTAL PROTEIN: 6.8 g/dL (ref 6.5–8.1)

## 2016-11-22 LAB — TSH: TSH: 0.044 u[IU]/mL — ABNORMAL LOW (ref 0.350–4.500)

## 2016-11-22 MED ORDER — PREDNISONE 20 MG PO TABS: ORAL_TABLET | ORAL | 0 refills | Status: DC

## 2016-11-22 NOTE — Progress Notes (Signed)
[facility] NOTE  Patient Care Team: Allyne Gee, MD as PCP - General (Internal Medicine) Seeplaputhur Robinette Haines, MD (General Surgery) [name], NP as Nurse Practitioner (Family Medicine) Lavera Guise, MD (Internal Medicine)  CHIEF COMPLAINTS/PURPOSE OF CONSULTATION: Malignancy of the thigh.   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 PET- Avid thigh mass/? Tumor thrombus; Bil avid lung nodules; Left pelvic LN;    # Nov 6th- START KEYTRUDA q 3W x2; DEC 4th PET- Improvement   # s/p RT to left thigh [finished oct 21st 2017]  # MOLECULAR TEST- F-ONE-PDL-1 HIGH [>50% cell]  # Hx of lung nodules [Dr.Khan]   # A.fib on coumadin; ? LLE DVT [? 6-7 years ago]; Smoker [quit >20 y]     Carcinoma of unknown primary ([place])   08/02/2016 Initial Diagnosis    Carcinoma of unknown primary ([place])      HISTORY OF PRESENTING ILLNESS:  DEBERA STERBA 83 y.o.  female noted to have a mass in the left medial thigh- With pelvic adenopathy and bilateral lung nodules- likely sarcoma currently s/p radiation is here for follow-up. Patient currently status post Keytruda Cycle #3  approximately 3 weeks ago.  Patient noted to have significant joint pains bilateral shoulders bilaterally elbows and hands. Mild pain in the hips and knees. This has been significantly debilitating to point that she has not been able to take care of herself. Patient has been taking ibuprofen without significant relief.  Patient notes she has significant improvement of her left thigh mass. Not completely resolved. Significant improvement in pain. Denies any blood in stools or black stools. No nausea no vomiting. No diarrhea.   Currently denies any blood in stools. Denies any significant pain.   ROS: A complete 10 point review of system is done which is negative except mentioned above in history of present  illness  MEDICAL HISTORY:  Past Medical History:  Diagnosis Date  . Atrial fibrillation ([place])   . Bronchitis   . Cancer ([place])    Left leg growth, kidneys, lungs and breasts  . Carcinoma of unknown primary ([place])   . COPD (chronic obstructive pulmonary disease) ([place])   . Diabetes mellitus without complication ([place])   . Hyperlipidemia   . Hypertension   . Pancreatitis   . Pneumonia   . Stroke ([place])   . Vitamin B12 deficiency     SURGICAL HISTORY: Past Surgical History:  Procedure Laterality Date  . ABDOMINAL HYSTERECTOMY  1975  . BLADDER SUSPENSION  1989  . 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 She walks with cane and a walker. Social History   Social History  . Marital status: Divorced    Spouse name: N/A  . Number of children: N/A  . Years of education: N/A   Occupational History  . Not on file.   Social History Main Topics  . Smoking status: Former Smoker    Packs/day: 0.50    Years: 15.00    Types: Cigarettes    Quit date: 11/15/1988  . Smokeless tobacco: Never Used  . Alcohol use No  . Drug use: No  . Sexual activity: Not on file     Comment: hysterectomy    Other Topics Concern  . Not on file   Social History Narrative  .  No narrative on file    FAMILY HISTORY: brother- colon-70s.. Sister- kidney cancer 73s.  No family history on file.  ALLERGIES:  is allergic to sulfa antibiotics; celecoxib; acetaminophen; codeine; penicillin g; and petrolatum-zinc oxide.  MEDICATIONS:  Current Outpatient Prescriptions  Medication Sig Dispense Refill  . albuterol (PROVENTIL HFA;VENTOLIN HFA) 108 (90 Base) MCG/ACT inhaler Inhale 1 puff into the lungs every 4 (four) hours as needed for wheezing or shortness of breath.    Marland Kitchen amLODipine-benazepril (LOTREL) 5-20 MG capsule TAKE 1 CAPSULE BY MOUTH EVERY DAY 90 capsule 0  . b complex vitamins capsule Take 1 capsule by mouth daily.    .  famotidine (PEPCID) 20 MG tablet Take 1 tablet (20 mg total) by mouth 2 (two) times daily. 30 tablet 0  . Fluticasone Furoate-Vilanterol 100-25 MCG/INH AEPB Inhale into the lungs.    . furosemide (LASIX) 20 MG tablet     . gabapentin (NEURONTIN) 300 MG capsule TAKE 2 CAPSULES BY MOUTH AT BEDTIME 60 capsule 5  . glipiZIDE (GLIPIZIDE XL) 2.5 MG 24 hr tablet Take 1 tablet (2.5 mg total) by mouth daily. PATIENT NEEDS TO SCHEDULE OFFICE VISIT FOR FOLLOW UP 90 tablet 0  . hydrochlorothiazide (HYDRODIURIL) 25 MG tablet Take 25 mg by mouth daily.    Marland Kitchen loratadine (CLARITIN) 10 MG tablet Take 10 mg by mouth daily.    . metFORMIN (GLUCOPHAGE) 500 MG tablet TAKE ONE TABLET BY MOUTH TWICE DAILY 180 tablet 0  . montelukast (SINGULAIR) 10 MG tablet Take 10 mg by mouth at bedtime.    Marland Kitchen oxybutynin (DITROPAN-XL) 10 MG 24 hr tablet TAKE ONE TABLET EVERY DAY 90 tablet 3  . pravastatin (PRAVACHOL) 40 MG tablet Take 40 mg by mouth daily.    . theophylline (THEO-24) 100 MG 24 hr capsule Take 100 mg by mouth daily.    Marland Kitchen Umeclidinium-Vilanterol (ANORO ELLIPTA) 62.5-25 MCG/INH AEPB     . warfarin (COUMADIN) 2 MG tablet Take 2 mg by mouth. One day a week    . warfarin (COUMADIN) 4 MG tablet Take 2 mg by mouth. 6 days of the week    . predniSONE (DELTASONE) 20 MG tablet 3 pills a day with AM with break fast. 80 tablet 0   No current facility-administered medications for this visit.       Marland Kitchen  PHYSICAL EXAMINATION: ECOG PERFORMANCE STATUS: 1 - Symptomatic but completely ambulatory  Vitals:   11/22/16 1337  BP: (!) 193/63  Pulse: (!) 103  Temp: 97.5 F (36.4 C)   Filed Weights   11/22/16 1337  Weight: 155 lb (70.3 kg)    GENERAL: Well-nourished well-developed; Alert, no distress and comfortable. She is in a wheelchair.. She appears much younger than her stated age.. Accompanied by 1 daughter.  EYES: no pallor or icterus OROPHARYNX: no thrush or ulceration; good dentition  NECK: supple, no masses  felt LYMPH:  no palpable lymphadenopathy in the cervical, axillary or inguinal regions LUNGS: clear to auscultation and  No wheeze or crackles HEART/CVS: regular rate & rhythm and no murmurs; No lower extremity edema; left medial thigh ~ 6 cm mass noted/ improved. ABDOMEN: abdomen soft, non-tender and normal bowel sounds Musculoskeletal:no cyanosis of digits and no clubbing; significant tenderness at the shoulder joints elbows and hands. Swelling noted in the hands. Patient is holding her upper extremities close to her because of the pain. PSYCH: alert & oriented x 3 with fluent speech NEURO: no focal motor/sensory deficits SKIN:  no rashes or significant lesions  LABORATORY DATA:  I have reviewed the data as listed Lab Results  Component Value Date   WBC 10.6 11/22/2016   HGB 12.4 11/22/2016   HCT 37.1 11/22/2016   MCV 81.3 11/22/2016   PLT 252 11/22/2016    Recent Labs  10/11/16 1325 11/01/16 1320 11/22/16 1245  NA 135 137 134*  K 4.0 4.5 4.0  CL 102 103 102  CO2 _0 GLUCOSE 157* 97 220*  BUN _1 CREATININE 0.89 0.92 0.88  CALCIUM 9.0 9.3 9.4  GFRNONAA 56* 54* 57*  GFRAA >60 >60 >60  PROT 6.8 7.0 6.8  ALBUMIN 4.1 4.1 3.9  AST _2 ALT _3 ALKPHOS 58 77 81  BILITOT 0.5 0.6 0.5    RADIOGRAPHIC STUDIES: I have personally reviewed the radiological images as listed and agreed with the findings in the report. Nm Pet Image Restage (ps) Whole Body  Result Date: 10/28/2016 CLINICAL DATA:  Subsequent treatment strategy for carcinoma of unknown primary. Currently on Keytruda immunotherapy EXAM: NUCLEAR MEDICINE PET WHOLE BODY TECHNIQUE: 13.4 mCi F-18 FDG was injected intravenously. Full-ring PET imaging was performed from the vertex to the feet after the radiotracer. CT data was obtained and used for attenuation correction and anatomic localization. FASTING BLOOD GLUCOSE:  Value: 141 Mg/dl COMPARISON:  PET-CT 07/26/2016 FINDINGS: NECK No hypermetabolic  lymph nodes in the neck. CHEST RIGHT upper lobe pulmonary nodule measures 8 mm compared to 9 mm. Metabolic activity is resolved. RIGHT upper lobe nodule at the apex measures 5 mm compared to 43m (images 99, series 4). No metabolic activity. 7 mm nodule in the posterior RIGHT upper lobe (image 105, series 4) has mild metabolic activity (SUV max 1.8) not changed from prior. Two LEFT lower lobe nodules are not seen on comparison exam measuring 8 mm on image 123, series 4 and 8 mm on image 126, series 4. ABDOMEN/PELVIS No abnormal hypermetabolic activity within the liver, pancreas, adrenal glands, or spleen. No hypermetabolic lymph nodes in the abdomen or pelvis. SKELETON No focal hypermetabolic activity to suggest skeletal metastasis. Extremities: The mass in the medial aspect of the LEFT upper leg has central photopenia and a rim of mild metabolic activity with SUV max equal 2.6 decreased from SUV max equal 26. Lesion measures 5.8 x 4.7 cm decreased minimally from 6.8 by 5.7 cm. The metabolic activity associated with the proximal venous structures of the LEFT upper leg is also dramatically reduced with SUV max equal 2.9 in the LEFT inguinal region compared with 10.6. There is still swelling of the LEFT lower extremity compared to the RIGHT. IMPRESSION: 1. Near complete resolution of metabolic activity associated with the large LEFT proximal thigh mass. 2. Near-complete resolution metabolic activity associated with the venous vascular structures of the proximal LEFT lower extremity and pelvis. 3. Decrease in metabolic activity of RIGHT pulmonary nodules. Similar size. 4. Two new pulmonary nodules in the LEFT lower lobe (potential pseudo progression effect of immunotherapy) . Electronically Signed   By: SSuzy BouchardM.D.   On: 10/28/2016 15:36    ASSESSMENT & PLAN:   Carcinoma of unknown primary ([place] Left thigh mass- along with left pelvic adenopathy; and bilateral lung nodules- STAGE IV-  biopsy high-grade  sarcoma [more likely] versus carcinoma [less likely]. S/p RT to left thigh- with clinical improvement. Patient currently on Keytruda [ High PDL-1 expression]. DEC 2017- PET san- PR.  # HOLD Keytruda- see discussion below. Patient adamantly refusing any further considation  of Keytruda  # Bil Upper extremity - pain/tenderness. Clinically concerning for auto-immune side effect- involving joints. Recommend starting prednsione 60 mg/day.   # Hx of DM- recommend watching blood sugars; if > 150- to let us know.   # A/fib/ LLE DVT-  on coumadin. Stable.   # follow up in 10 days/ cbc/bmp/crp/ CK.     Cammie Sickle, MD 11/23/2016 8:22 AM

## 2016-11-22 NOTE — Assessment & Plan Note (Addendum)
Left thigh mass- along with left pelvic adenopathy; and bilateral lung nodules- STAGE IV-  biopsy high-grade sarcoma [more likely] versus carcinoma [less likely]. S/p RT to left thigh- with clinical improvement. Patient currently on Keytruda [ High PDL-1 expression]. DEC 2017- PET san- PR.  # HOLD Keytruda- see discussion below. Patient adamantly refusing any further considation of Keytruda  # Bil Upper extremity - pain/tenderness. Clinically concerning for auto-immune side effect- involving joints. Recommend starting prednsione 60 mg/day.   # Hx of DM- recommend watching blood sugars; if > 150- to let us know.   # A/fib/ LLE DVT-  on coumadin. Stable.   # follow up in 10 days/ cbc/bmp/crp/ CK.

## 2016-11-22 NOTE — Progress Notes (Signed)
Patient here today for follow up.  Attempted to review medications with patient, explained that we were showing two different metformin rx, one ER (as seen in the reconciliation) and one not, advised patient to clarify with her PCP to confirm that possibly wrong rx sent to the pharmacy. Patient yelled at me saying that she is not stupid that she trust her doctor, still unclear of correct Metformin she is taking.

## 2016-12-03 ENCOUNTER — Inpatient Hospital Stay (HOSPITAL_BASED_OUTPATIENT_CLINIC_OR_DEPARTMENT_OTHER): Payer: Medicare Other | Admitting: Internal Medicine

## 2016-12-03 ENCOUNTER — Inpatient Hospital Stay: Payer: Medicare Other

## 2016-12-03 VITALS — BP 136/78 | HR 73 | Temp 98.2°F | Resp 22 | Ht 64.0 in | Wt 157.9 lb

## 2016-12-03 DIAGNOSIS — I10 Essential (primary) hypertension: Secondary | ICD-10-CM

## 2016-12-03 DIAGNOSIS — Z79899 Other long term (current) drug therapy: Secondary | ICD-10-CM

## 2016-12-03 DIAGNOSIS — Z8669 Personal history of other diseases of the nervous system and sense organs: Secondary | ICD-10-CM

## 2016-12-03 DIAGNOSIS — M25551 Pain in right hip: Secondary | ICD-10-CM

## 2016-12-03 DIAGNOSIS — E119 Type 2 diabetes mellitus without complications: Secondary | ICD-10-CM | POA: Diagnosis not present

## 2016-12-03 DIAGNOSIS — M25552 Pain in left hip: Secondary | ICD-10-CM

## 2016-12-03 DIAGNOSIS — Z8051 Family history of malignant neoplasm of kidney: Secondary | ICD-10-CM

## 2016-12-03 DIAGNOSIS — E785 Hyperlipidemia, unspecified: Secondary | ICD-10-CM

## 2016-12-03 DIAGNOSIS — Z7901 Long term (current) use of anticoagulants: Secondary | ICD-10-CM

## 2016-12-03 DIAGNOSIS — Z8719 Personal history of other diseases of the digestive system: Secondary | ICD-10-CM

## 2016-12-03 DIAGNOSIS — Z7984 Long term (current) use of oral hypoglycemic drugs: Secondary | ICD-10-CM

## 2016-12-03 DIAGNOSIS — J449 Chronic obstructive pulmonary disease, unspecified: Secondary | ICD-10-CM

## 2016-12-03 DIAGNOSIS — Z8701 Personal history of pneumonia (recurrent): Secondary | ICD-10-CM

## 2016-12-03 DIAGNOSIS — C801 Malignant (primary) neoplasm, unspecified: Secondary | ICD-10-CM

## 2016-12-03 DIAGNOSIS — E1165 Type 2 diabetes mellitus with hyperglycemia: Secondary | ICD-10-CM | POA: Diagnosis not present

## 2016-12-03 DIAGNOSIS — Z7952 Long term (current) use of systemic steroids: Secondary | ICD-10-CM

## 2016-12-03 DIAGNOSIS — I4891 Unspecified atrial fibrillation: Secondary | ICD-10-CM

## 2016-12-03 DIAGNOSIS — Z86718 Personal history of other venous thrombosis and embolism: Secondary | ICD-10-CM

## 2016-12-03 DIAGNOSIS — E538 Deficiency of other specified B group vitamins: Secondary | ICD-10-CM

## 2016-12-03 DIAGNOSIS — Z87891 Personal history of nicotine dependence: Secondary | ICD-10-CM

## 2016-12-03 DIAGNOSIS — C7989 Secondary malignant neoplasm of other specified sites: Secondary | ICD-10-CM | POA: Diagnosis not present

## 2016-12-03 DIAGNOSIS — Z923 Personal history of irradiation: Secondary | ICD-10-CM

## 2016-12-03 DIAGNOSIS — Z9221 Personal history of antineoplastic chemotherapy: Secondary | ICD-10-CM

## 2016-12-03 LAB — CBC WITH DIFFERENTIAL/PLATELET
BASOS PCT: 0 %
Basophils Absolute: 0 10*3/uL (ref 0–0.1)
EOS ABS: 0.1 10*3/uL (ref 0–0.7)
Eosinophils Relative: 0 %
HCT: 35.8 % (ref 35.0–47.0)
HEMOGLOBIN: 11.8 g/dL — AB (ref 12.0–16.0)
LYMPHS ABS: 1.7 10*3/uL (ref 1.0–3.6)
Lymphocytes Relative: 10 %
MCH: 27.2 pg (ref 26.0–34.0)
MCHC: 32.9 g/dL (ref 32.0–36.0)
MCV: 82.6 fL (ref 80.0–100.0)
Monocytes Absolute: 0.9 10*3/uL (ref 0.2–0.9)
Monocytes Relative: 6 %
NEUTROS PCT: 84 %
Neutro Abs: 13.8 10*3/uL — ABNORMAL HIGH (ref 1.4–6.5)
Platelets: 236 10*3/uL (ref 150–440)
RBC: 4.33 MIL/uL (ref 3.80–5.20)
RDW: 18.7 % — ABNORMAL HIGH (ref 11.5–14.5)
WBC: 16.6 10*3/uL — AB (ref 3.6–11.0)

## 2016-12-03 LAB — BASIC METABOLIC PANEL
Anion gap: 8 (ref 5–15)
BUN: 26 mg/dL — ABNORMAL HIGH (ref 6–20)
CHLORIDE: 101 mmol/L (ref 101–111)
CO2: 26 mmol/L (ref 22–32)
Calcium: 8.9 mg/dL (ref 8.9–10.3)
Creatinine, Ser: 1.03 mg/dL — ABNORMAL HIGH (ref 0.44–1.00)
GFR calc Af Amer: 55 mL/min — ABNORMAL LOW (ref >60)
GFR calc non Af Amer: 47 mL/min — ABNORMAL LOW (ref >60)
Glucose, Bld: 221 mg/dL — ABNORMAL HIGH (ref 65–99)
POTASSIUM: 4.6 mmol/L (ref 3.5–5.1)
SODIUM: 135 mmol/L (ref 135–145)

## 2016-12-03 LAB — C-REACTIVE PROTEIN

## 2016-12-03 LAB — CK: CK TOTAL: 31 U/L — AB (ref 38–234)

## 2016-12-03 NOTE — Progress Notes (Signed)
Unable to reconcile pt medication. Patient become verbally angry and shouted at RN. "You do not need to ask me anything on my med list. I know what I take. I take the same thing over and over again."  I explained to "patient that in order to follow-safety precautions, I need to review her list to evaluate the medication. Pt educated that this is to reconcile to ensure that the doctor has the most accurate list."  Patient shouted at nurse and pt's daughter. Pt states, "I do not want you asking me any other questions. I know what I take. I know what medications I take. I know what medications I take and that's all you need to know."  I asked the daughter if she could review the medication list. Patient's daughter refused to review and look at medication list. Pt is agitated, anxious and uncooperative and refuses to speak to the nurse about her care.  Daughter-uncooperative; declined to interact with nurse.

## 2016-12-03 NOTE — Progress Notes (Signed)
Monica [name] NOTE  Patient Care Team: Allyne Gee, MD as PCP - General (Internal Medicine) Seeplaputhur Robinette Haines, MD (General Surgery) [name], NP as Nurse Practitioner (Family Medicine) Lavera Guise, MD (Internal Medicine)  CHIEF COMPLAINTS/PURPOSE OF CONSULTATION: Malignancy of the thigh.   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 PET- Avid thigh mass/? Tumor thrombus; Bil avid lung nodules; Left pelvic LN;    # Nov 6th- START KEYTRUDA q 3W x2; DEC 4th PET- Improvement   # s/p RT to left thigh [finished oct 21st 2017]  # MOLECULAR TEST- F-ONE-PDL-1 HIGH [>50% cell]  # Hx of lung nodules [Dr.Khan]   # A.fib on coumadin; ? LLE DVT [? 6-7 years ago]; Smoker [quit >20 y]     Carcinoma of unknown primary ([place])   08/02/2016 Initial Diagnosis    Carcinoma of unknown primary ([place])      HISTORY OF PRESENTING ILLNESS:  Monica [name] 83 y.o.  female noted to have a mass in the left medial thigh- With pelvic adenopathy and bilateral lung nodules- likely sarcoma currently s/p radiation is here for follow-up. Patient currently status post Keytruda Cycle #3  approximately 5 weeks ago. Beryle Flock is currently on hold because of significant joint pains. Patient was started on prednisone 60 mg a day.  Patient noted to have improvement of the joint pains; however this has not completely resolved. Patient denies any pain. Denies any nausea vomiting or blood in stools. No diarrhea.    ROS: A complete 10 point review of system is done which is negative except mentioned above in history of present illness  MEDICAL HISTORY:  Past Medical History:  Diagnosis Date  . Atrial fibrillation ([place])   . Bronchitis   . Cancer ([place])    Left leg growth, kidneys, lungs and breasts  . Carcinoma of unknown primary ([place])   . COPD (chronic obstructive pulmonary disease) ([place])   . Diabetes  mellitus without complication ([place])   . Hyperlipidemia   . Hypertension   . Pancreatitis   . Pneumonia   . Stroke ([place])   . Vitamin B12 deficiency     SURGICAL HISTORY: Past Surgical History:  Procedure Laterality Date  . ABDOMINAL HYSTERECTOMY  1975  . BLADDER SUSPENSION  1989  . 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 She walks with cane and a walker. Social History   Social History  . Marital status: Divorced    Spouse name: N/A  . Number of children: N/A  . Years of education: N/A   Occupational History  . Not on file.   Social History Main Topics  . Smoking status: Former Smoker    Packs/day: 0.50    Years: 15.00    Types: Cigarettes    Quit date: 11/15/1988  . Smokeless tobacco: Never Used  . Alcohol use No  . Drug use: No  . Sexual activity: Not on file     Comment: hysterectomy    Other Topics Concern  . Not on file   Social History Narrative  . No narrative on file    FAMILY HISTORY: brother- colon-70s.. Sister- kidney cancer 79s.  No family history on file.  ALLERGIES:  is allergic to sulfa antibiotics; celecoxib; acetaminophen; codeine; penicillin g; and petrolatum-zinc oxide.  MEDICATIONS:  Current  Outpatient Prescriptions  Medication Sig Dispense Refill  . albuterol (PROVENTIL HFA;VENTOLIN HFA) 108 (90 Base) MCG/ACT inhaler Inhale 1 puff into the lungs every 4 (four) hours as needed for wheezing or shortness of breath.    Marland Kitchen amLODipine-benazepril (LOTREL) 5-20 MG capsule TAKE 1 CAPSULE BY MOUTH EVERY DAY 90 capsule 0  . oxybutynin (DITROPAN-XL) 10 MG 24 hr tablet TAKE ONE TABLET EVERY DAY 90 tablet 3  . pravastatin (PRAVACHOL) 40 MG tablet Take 40 mg by mouth daily.    . predniSONE (DELTASONE) 20 MG tablet 3 pills a day with AM with break fast. 80 tablet 0  . b complex vitamins capsule Take 1 capsule by mouth daily.    . famotidine (PEPCID) 20 MG tablet Take 1  tablet (20 mg total) by mouth 2 (two) times daily. 30 tablet 0  . Fluticasone Furoate-Vilanterol 100-25 MCG/INH AEPB Inhale into the lungs.    . furosemide (LASIX) 20 MG tablet     . gabapentin (NEURONTIN) 300 MG capsule TAKE 2 CAPSULES BY MOUTH AT BEDTIME 60 capsule 5  . glipiZIDE (GLIPIZIDE XL) 2.5 MG 24 hr tablet Take 1 tablet (2.5 mg total) by mouth daily. PATIENT NEEDS TO SCHEDULE OFFICE VISIT FOR FOLLOW UP 90 tablet 0  . hydrochlorothiazide (HYDRODIURIL) 25 MG tablet Take 25 mg by mouth daily.    Marland Kitchen loratadine (CLARITIN) 10 MG tablet Take 10 mg by mouth daily.    . metFORMIN (GLUCOPHAGE) 500 MG tablet TAKE ONE TABLET BY MOUTH TWICE DAILY 180 tablet 0  . montelukast (SINGULAIR) 10 MG tablet Take 10 mg by mouth at bedtime.    . theophylline (THEO-24) 100 MG 24 hr capsule Take 100 mg by mouth daily.    Marland Kitchen Umeclidinium-Vilanterol (ANORO ELLIPTA) 62.5-25 MCG/INH AEPB     . warfarin (COUMADIN) 2 MG tablet Take 2 mg by mouth. One day a week    . warfarin (COUMADIN) 4 MG tablet Take 2 mg by mouth. 6 days of the week     No current facility-administered medications for this visit.       Marland Kitchen  PHYSICAL EXAMINATION: ECOG PERFORMANCE STATUS: 1 - Symptomatic but completely ambulatory  Vitals:   12/03/16 1141  BP: 136/78  Pulse: 73  Resp: (!) 22  Temp: 98.2 F (36.8 C)   Filed Weights   12/03/16 1141  Weight: 157 lb 14.4 oz (71.6 kg)    GENERAL: Well-nourished well-developed; Alert, no distress and comfortable. She is in a wheelchair.. She appears much younger than her stated age.. Accompanied by 1 daughter.  EYES: no pallor or icterus OROPHARYNX: no thrush or ulceration; good dentition  NECK: supple, no masses felt LYMPH:  no palpable lymphadenopathy in the cervical, axillary or inguinal regions LUNGS: clear to auscultation and  No wheeze or crackles HEART/CVS: regular rate & rhythm and no murmurs; No lower extremity edema; left medial thigh ~ 4 cm mass noted/ improved. ABDOMEN:  abdomen soft, non-tender and normal bowel sounds Musculoskeletal:no cyanosis of digits and no clubbing; mild tenderness at the shoulder joints elbows and hands. PSYCH: alert & oriented x 3 with fluent speech NEURO: no focal motor/sensory deficits SKIN:  no rashes or significant lesions  LABORATORY DATA:  I have reviewed the data as listed Lab Results  Component Value Date   WBC 16.6 (H) 12/03/2016   HGB 11.8 (L) 12/03/2016   HCT 35.8 12/03/2016   MCV 82.6 12/03/2016   PLT 236 12/03/2016    Recent Labs  10/11/16 1325 11/01/16 1320 11/22/16 1245  12/03/16 1110  NA 135 137 134* 135  K 4.0 4.5 4.0 4.6  CL 102 103 102 101  CO2 _0 GLUCOSE 157* 97 220* 221*  BUN _1 26*  CREATININE 0.89 0.92 0.88 1.03*  CALCIUM 9.0 9.3 9.4 8.9  GFRNONAA 56* 54* 57* 47*  GFRAA >60 >60 >60 55*  PROT 6.8 7.0 6.8  --   ALBUMIN 4.1 4.1 3.9  --   AST _2 --   ALT _3 --   ALKPHOS 58 77 81  --   BILITOT 0.5 0.6 0.5  --     RADIOGRAPHIC STUDIES: I have personally reviewed the radiological images as listed and agreed with the findings in the report. No results found.  ASSESSMENT & PLAN:   Carcinoma of unknown primary ([place]) Left thigh mass- along with left pelvic adenopathy; and bilateral lung nodules- STAGE IV-  biopsy high-grade sarcoma [more likely] versus carcinoma [less likely]. S/p RT to left thigh- with clinical improvement. Patient currently on Keytruda [ High PDL-1 expression]. DEC 2017- PET san- PR.  # HOLD Keytruda- see discussion below. Patient adamantly refusing any further considation of Keytruda. If clinical deterioration happens- hospice should be considered.   # Bil Upper extremity - pain/tenderness-improving on steroids. Clinically concerning for auto-immune side effect of keytruda- involving joints. Discontinue Keytruda. Recommend slow prednisone taper based on clinical improvement. For now recommend prednisone 38m/day x2 weeks; and taper accordingly.    # Increased Blood sugars- sec to steroids- recommend monitoring closely/follow up with PCP.   # [facility] based on labs.  PCP office.   # Hx of DM- recommend watching blood sugars; if > 150- to let PCP know.    # A/fib/ LLE DVT-  on coumadin. Stable. Patient is risk of falls; continuation of Coumadin needs to be revisited with the patient; risk and benefits should be reviewed the patient  # Patient does not want any further treatments; no follow-up scans; in fact [facility] up with [facility]CP per pt's decision in appx 2 weeks.  Discussed with Dr.Khan. He kindly agrees with the plan. The plan was also discussed with patient's daughter.   # 40 minutes face-to-face with the patient discussing the above plan of care; more than 50% of time spent on prognosis/ natural history; counseling and coordination.    GCammie Sickle MD 12/03/2016 3:33 PM

## 2016-12-03 NOTE — Assessment & Plan Note (Addendum)
Left thigh mass- along with left pelvic adenopathy; and bilateral lung nodules- STAGE IV-  biopsy high-grade sarcoma [more likely] versus carcinoma [less likely]. S/p RT to left thigh- with clinical improvement. Patient currently on Keytruda [ High PDL-1 expression]. DEC 2017- PET san- PR.  # HOLD Keytruda- see discussion below. Patient adamantly refusing any further considation of Keytruda. If clinical deterioration happens- hospice should be considered.   # Bil Upper extremity - pain/tenderness-improving on steroids. Clinically concerning for auto-immune side effect of keytruda- involving joints. Discontinue Keytruda. Recommend slow prednisone taper based on clinical improvement. For now recommend prednisone 40mg/day x2 weeks; and taper accordingly.   # Increased Blood sugars- sec to steroids- recommend monitoring closely/follow up with PCP.   # Low synthroid- sec to Keytruda; repeat labs in 2 weeks/manage based on labs.  PCP office.   # Hx of DM- recommend watching blood sugars; if > 150- to let PCP know.    # A/fib/ LLE DVT-  on coumadin. Stable. Patient is risk of falls; continuation of Coumadin needs to be revisited with the patient; risk and benefits should be reviewed the patient  # Patient does not want any further treatments; no follow-up scans; in fact she does not want to follow-up at the cancer [facility]. Follow up with PCP per pt's decision in appx 2 weeks.  Discussed with Dr.Khan. He kindly agrees with the plan. The plan was also discussed with patient's daughter.   # 40 minutes face-to-face with the patient discussing the above plan of care; more than 50% of time spent on prognosis/ natural history; counseling and coordination. 

## 2016-12-09 DIAGNOSIS — I482 Chronic atrial fibrillation: Secondary | ICD-10-CM | POA: Diagnosis not present

## 2016-12-21 DIAGNOSIS — J301 Allergic rhinitis due to pollen: Secondary | ICD-10-CM | POA: Diagnosis not present

## 2016-12-21 DIAGNOSIS — R05 Cough: Secondary | ICD-10-CM | POA: Diagnosis not present

## 2016-12-21 DIAGNOSIS — J069 Acute upper respiratory infection, unspecified: Secondary | ICD-10-CM | POA: Diagnosis not present

## 2017-01-06 DIAGNOSIS — I482 Chronic atrial fibrillation: Secondary | ICD-10-CM | POA: Diagnosis not present

## 2017-02-04 DIAGNOSIS — I482 Chronic atrial fibrillation: Secondary | ICD-10-CM | POA: Diagnosis not present

## 2017-02-28 DIAGNOSIS — M064 Inflammatory polyarthropathy: Secondary | ICD-10-CM | POA: Diagnosis not present

## 2017-02-28 DIAGNOSIS — E1165 Type 2 diabetes mellitus with hyperglycemia: Secondary | ICD-10-CM | POA: Diagnosis not present

## 2017-02-28 DIAGNOSIS — Z0001 Encounter for general adult medical examination with abnormal findings: Secondary | ICD-10-CM | POA: Diagnosis not present

## 2017-02-28 DIAGNOSIS — N390 Urinary tract infection, site not specified: Secondary | ICD-10-CM | POA: Diagnosis not present

## 2017-02-28 DIAGNOSIS — I10 Essential (primary) hypertension: Secondary | ICD-10-CM | POA: Diagnosis not present

## 2017-02-28 DIAGNOSIS — J449 Chronic obstructive pulmonary disease, unspecified: Secondary | ICD-10-CM | POA: Diagnosis not present

## 2017-02-28 DIAGNOSIS — I482 Chronic atrial fibrillation: Secondary | ICD-10-CM | POA: Diagnosis not present

## 2017-02-28 DIAGNOSIS — C801 Malignant (primary) neoplasm, unspecified: Secondary | ICD-10-CM | POA: Diagnosis not present

## 2017-03-04 DIAGNOSIS — I482 Chronic atrial fibrillation: Secondary | ICD-10-CM | POA: Diagnosis not present

## 2017-03-22 ENCOUNTER — Ambulatory Visit
Admission: RE | Admit: 2017-03-22 | Discharge: 2017-03-22 | Disposition: A | Discharge status: Home / Self Care | Payer: Medicare Other | Source: Ambulatory Visit | Attending: Internal Medicine | Admitting: Internal Medicine

## 2017-03-22 ENCOUNTER — Other Ambulatory Visit: Payer: Self-pay | Admitting: Internal Medicine

## 2017-03-22 DIAGNOSIS — R059 Cough, unspecified: Secondary | ICD-10-CM

## 2017-03-22 DIAGNOSIS — R05 Cough: Secondary | ICD-10-CM | POA: Diagnosis not present

## 2017-03-22 DIAGNOSIS — J449 Chronic obstructive pulmonary disease, unspecified: Secondary | ICD-10-CM | POA: Insufficient documentation

## 2017-03-22 DIAGNOSIS — I482 Chronic atrial fibrillation: Secondary | ICD-10-CM | POA: Diagnosis not present

## 2017-03-22 DIAGNOSIS — R0602 Shortness of breath: Secondary | ICD-10-CM | POA: Diagnosis not present

## 2017-03-22 DIAGNOSIS — J069 Acute upper respiratory infection, unspecified: Secondary | ICD-10-CM | POA: Diagnosis not present

## 2017-03-28 DIAGNOSIS — J301 Allergic rhinitis due to pollen: Secondary | ICD-10-CM | POA: Diagnosis not present

## 2017-03-28 DIAGNOSIS — J069 Acute upper respiratory infection, unspecified: Secondary | ICD-10-CM | POA: Diagnosis not present

## 2017-03-28 DIAGNOSIS — J449 Chronic obstructive pulmonary disease, unspecified: Secondary | ICD-10-CM | POA: Diagnosis not present

## 2017-03-28 DIAGNOSIS — R0602 Shortness of breath: Secondary | ICD-10-CM | POA: Diagnosis not present

## 2017-04-14 DIAGNOSIS — I482 Chronic atrial fibrillation: Secondary | ICD-10-CM | POA: Diagnosis not present

## 2017-05-13 DIAGNOSIS — I482 Chronic atrial fibrillation: Secondary | ICD-10-CM | POA: Diagnosis not present

## 2017-06-01 DIAGNOSIS — I10 Essential (primary) hypertension: Secondary | ICD-10-CM | POA: Diagnosis not present

## 2017-06-01 DIAGNOSIS — C801 Malignant (primary) neoplasm, unspecified: Secondary | ICD-10-CM | POA: Diagnosis not present

## 2017-06-01 DIAGNOSIS — M064 Inflammatory polyarthropathy: Secondary | ICD-10-CM | POA: Diagnosis not present

## 2017-06-01 DIAGNOSIS — J449 Chronic obstructive pulmonary disease, unspecified: Secondary | ICD-10-CM | POA: Diagnosis not present

## 2017-06-01 DIAGNOSIS — Z7901 Long term (current) use of anticoagulants: Secondary | ICD-10-CM | POA: Diagnosis not present

## 2017-06-01 DIAGNOSIS — E1165 Type 2 diabetes mellitus with hyperglycemia: Secondary | ICD-10-CM | POA: Diagnosis not present

## 2017-06-16 DIAGNOSIS — I482 Chronic atrial fibrillation: Secondary | ICD-10-CM | POA: Diagnosis not present

## 2017-07-16 DIAGNOSIS — I482 Chronic atrial fibrillation: Secondary | ICD-10-CM | POA: Diagnosis not present

## 2017-07-26 DIAGNOSIS — I482 Chronic atrial fibrillation: Secondary | ICD-10-CM | POA: Diagnosis not present

## 2017-07-26 DIAGNOSIS — R0602 Shortness of breath: Secondary | ICD-10-CM | POA: Diagnosis not present

## 2017-07-26 DIAGNOSIS — C4022 Malignant neoplasm of long bones of left lower limb: Secondary | ICD-10-CM | POA: Diagnosis not present

## 2017-07-26 DIAGNOSIS — R05 Cough: Secondary | ICD-10-CM | POA: Diagnosis not present

## 2017-08-16 DIAGNOSIS — I482 Chronic atrial fibrillation: Secondary | ICD-10-CM | POA: Diagnosis not present

## 2017-08-30 DIAGNOSIS — Z7901 Long term (current) use of anticoagulants: Secondary | ICD-10-CM | POA: Diagnosis not present

## 2017-08-30 DIAGNOSIS — E1165 Type 2 diabetes mellitus with hyperglycemia: Secondary | ICD-10-CM | POA: Diagnosis not present

## 2017-08-30 DIAGNOSIS — C4022 Malignant neoplasm of long bones of left lower limb: Secondary | ICD-10-CM | POA: Diagnosis not present

## 2017-08-30 DIAGNOSIS — I10 Essential (primary) hypertension: Secondary | ICD-10-CM | POA: Diagnosis not present

## 2017-08-30 DIAGNOSIS — G4700 Insomnia, unspecified: Secondary | ICD-10-CM | POA: Diagnosis not present

## 2017-08-30 DIAGNOSIS — Z23 Encounter for immunization: Secondary | ICD-10-CM | POA: Diagnosis not present

## 2017-09-23 DIAGNOSIS — I482 Chronic atrial fibrillation: Secondary | ICD-10-CM | POA: Diagnosis not present

## 2017-09-28 DIAGNOSIS — J449 Chronic obstructive pulmonary disease, unspecified: Secondary | ICD-10-CM | POA: Diagnosis not present

## 2017-09-28 DIAGNOSIS — C4022 Malignant neoplasm of long bones of left lower limb: Secondary | ICD-10-CM | POA: Diagnosis not present

## 2017-09-28 DIAGNOSIS — J069 Acute upper respiratory infection, unspecified: Secondary | ICD-10-CM | POA: Diagnosis not present

## 2017-09-28 DIAGNOSIS — I10 Essential (primary) hypertension: Secondary | ICD-10-CM | POA: Diagnosis not present

## 2017-09-28 DIAGNOSIS — R05 Cough: Secondary | ICD-10-CM | POA: Diagnosis not present

## 2017-10-19 ENCOUNTER — Other Ambulatory Visit: Payer: Self-pay | Admitting: Family Medicine

## 2017-10-19 NOTE — Telephone Encounter (Signed)
Please advise pharmacy this patient is no longer seen here. I think she sees dr. Humphrey Rolls.

## 2017-10-19 NOTE — Telephone Encounter (Signed)
Pharmacy requesting refills. Thanks!  

## 2017-10-20 NOTE — Telephone Encounter (Signed)
Pharmacy advised  

## 2017-10-21 DIAGNOSIS — I482 Chronic atrial fibrillation: Secondary | ICD-10-CM | POA: Diagnosis not present

## 2017-11-18 ENCOUNTER — Other Ambulatory Visit: Payer: Self-pay | Admitting: Family Medicine

## 2017-11-23 ENCOUNTER — Other Ambulatory Visit: Payer: Self-pay

## 2017-11-23 ENCOUNTER — Other Ambulatory Visit: Payer: Self-pay | Admitting: Family Medicine

## 2017-11-23 MED ORDER — GLUCOSE BLOOD VI STRP: 1.0000 | ORAL_STRIP | 1 refills | Status: DC | PRN

## 2017-11-23 MED ORDER — AMLODIPINE BESY-BENAZEPRIL HCL 5-20 MG PO CAPS: ORAL_CAPSULE | ORAL | 0 refills | Status: DC

## 2017-11-29 ENCOUNTER — Other Ambulatory Visit: Payer: Self-pay | Admitting: Internal Medicine

## 2017-11-29 DIAGNOSIS — C801 Malignant (primary) neoplasm, unspecified: Secondary | ICD-10-CM

## 2017-12-09 DIAGNOSIS — I482 Chronic atrial fibrillation: Secondary | ICD-10-CM | POA: Diagnosis not present

## 2017-12-22 ENCOUNTER — Other Ambulatory Visit: Payer: Self-pay

## 2017-12-22 MED ORDER — OXYBUTYNIN CHLORIDE ER 10 MG PO TB24: 10.0000 mg | ORAL_TABLET | Freq: Every day | ORAL | 3 refills | Status: DC

## 2017-12-27 ENCOUNTER — Encounter: Payer: Self-pay | Admitting: Nurse Practitioner

## 2017-12-27 ENCOUNTER — Ambulatory Visit (INDEPENDENT_AMBULATORY_CARE_PROVIDER_SITE_OTHER): Payer: Medicare Other | Admitting: Nurse Practitioner

## 2017-12-27 VITALS — BP 140/80 | HR 73 | Temp 97.9°F | Resp 16 | Ht 63.0 in | Wt 157.0 lb

## 2017-12-27 DIAGNOSIS — N390 Urinary tract infection, site not specified: Secondary | ICD-10-CM

## 2017-12-27 DIAGNOSIS — E1165 Type 2 diabetes mellitus with hyperglycemia: Secondary | ICD-10-CM | POA: Insufficient documentation

## 2017-12-27 DIAGNOSIS — J440 Chronic obstructive pulmonary disease with acute lower respiratory infection: Secondary | ICD-10-CM

## 2017-12-27 DIAGNOSIS — I10 Essential (primary) hypertension: Secondary | ICD-10-CM

## 2017-12-27 DIAGNOSIS — N3001 Acute cystitis with hematuria: Secondary | ICD-10-CM | POA: Insufficient documentation

## 2017-12-27 DIAGNOSIS — R300 Dysuria: Secondary | ICD-10-CM | POA: Diagnosis not present

## 2017-12-27 DIAGNOSIS — J449 Chronic obstructive pulmonary disease, unspecified: Secondary | ICD-10-CM | POA: Insufficient documentation

## 2017-12-27 DIAGNOSIS — J209 Acute bronchitis, unspecified: Secondary | ICD-10-CM | POA: Diagnosis not present

## 2017-12-27 DIAGNOSIS — R350 Frequency of micturition: Secondary | ICD-10-CM | POA: Diagnosis not present

## 2017-12-27 LAB — POCT GLYCOSYLATED HEMOGLOBIN (HGB A1C): Hemoglobin A1C: 5.9

## 2017-12-27 LAB — POCT URINALYSIS DIPSTICK
Bilirubin, UA: NEGATIVE
Blood, UA: NEGATIVE
Glucose, UA: NEGATIVE
KETONES UA: NEGATIVE
NITRITE UA: NEGATIVE
PH UA: 6 (ref 5.0–8.0)
PROTEIN UA: NEGATIVE
SPEC GRAV UA: 1.01 (ref 1.010–1.025)
UROBILINOGEN UA: 0.2 U/dL

## 2017-12-27 MED ORDER — HYDROCHLOROTHIAZIDE 25 MG PO TABS: 25.0000 mg | ORAL_TABLET | Freq: Every day | ORAL | 5 refills | Status: DC

## 2017-12-27 MED ORDER — OXYBUTYNIN CHLORIDE ER 10 MG PO TB24: 10.0000 mg | ORAL_TABLET | Freq: Every day | ORAL | 5 refills | Status: DC

## 2017-12-27 MED ORDER — LEVOFLOXACIN 500 MG PO TABS: 500.0000 mg | ORAL_TABLET | Freq: Every day | ORAL | 0 refills | Status: DC

## 2017-12-27 MED ORDER — PREDNISONE 10 MG (48) PO TBPK: ORAL_TABLET | ORAL | 0 refills | Status: DC

## 2017-12-27 NOTE — Progress Notes (Signed)
[facility] [place], [place] [address]  Internal MEDICINE  Office Visit Note  Patient Name: Monica [name]  [id number]  [id number]  Date of Service: 01/04/2018  Chief Complaint  Patient presents with  . URI  . Urinary Tract Infection    URI   This is a new problem. The current episode started in the past 7 days. The problem has been gradually worsening. There has been no fever. Associated symptoms include congestion, coughing, sinus pain and wheezing. Pertinent negatives include no headaches, nausea, rash or vomiting. She has tried decongestant for the symptoms. The treatment provided no relief.  Urinary Tract Infection   This is a recurrent problem. The current episode started 1 to 4 weeks ago. The problem occurs intermittently. The problem has been gradually worsening. The patient is experiencing no pain. She is not sexually active. There is a history of pyelonephritis. Associated symptoms include flank pain and frequency. Pertinent negatives include no chills, nausea or vomiting. She has tried nothing for the symptoms. Her past medical history is significant for recurrent UTIs.    Pt is here for routine follow up.    Current Medication: Outpatient Encounter Medications as of 12/27/2017  Medication Sig Note  . albuterol (PROVENTIL HFA;VENTOLIN HFA) 108 (90 Base) MCG/ACT inhaler Inhale 1 puff into the lungs every 4 (four) hours as needed for wheezing or shortness of breath.   Marland Kitchen amLODipine-benazepril (LOTREL) 5-20 MG capsule TAKE 1 CAPSULE BY MOUTH EVERY DAY   . b complex vitamins capsule Take 1 capsule by mouth daily.   . famotidine (PEPCID) 20 MG tablet Take 1 tablet (20 mg total) by mouth 2 (two) times daily.   . Fluticasone Furoate-Vilanterol 100-25 MCG/INH AEPB Inhale into the lungs.   . furosemide (LASIX) 20 MG tablet  08/16/2016: Received from: External Pharmacy  . gabapentin (NEURONTIN) 300 MG capsule TAKE 2 CAPSULES BY MOUTH AT BEDTIME   . glipiZIDE  (GLIPIZIDE XL) 2.5 MG 24 hr tablet Take 1 tablet (2.5 mg total) by mouth daily. PATIENT NEEDS TO SCHEDULE OFFICE VISIT FOR FOLLOW UP   . glucose blood test strip 1 each by Other route as needed for other. Use as instructed   . hydrochlorothiazide (HYDRODIURIL) 25 MG tablet Take 1 tablet (25 mg total) by mouth daily.   Marland Kitchen levofloxacin (LEVAQUIN) 500 MG tablet Take 1 tablet (500 mg total) by mouth daily.   Marland Kitchen loratadine (CLARITIN) 10 MG tablet Take 10 mg by mouth daily.   . metFORMIN (GLUCOPHAGE) 500 MG tablet TAKE ONE TABLET BY MOUTH TWICE DAILY   . montelukast (SINGULAIR) 10 MG tablet Take 10 mg by mouth at bedtime.   Marland Kitchen oxybutynin (DITROPAN-XL) 10 MG 24 hr tablet Take 1 tablet (10 mg total) by mouth daily.   . pravastatin (PRAVACHOL) 40 MG tablet Take 40 mg by mouth daily.   . predniSONE (STERAPRED UNI-PAK 48 TAB) 10 MG (48) TBPK tablet 12 day dose pack - take by mouth as directed for 12 days   . theophylline (THEO-24) 100 MG 24 hr capsule Take 100 mg by mouth daily.   Marland Kitchen Umeclidinium-Vilanterol (ANORO ELLIPTA) 62.5-25 MCG/INH AEPB    . warfarin (COUMADIN) 2 MG tablet Take 2 mg by mouth. One day a week 11/22/2016: Received from: External Pharmacy  . warfarin (COUMADIN) 4 MG tablet TAKE ONE TABLET EVERY DAY EXCEPT MONDAY TAKE 1/2 TABLET   . [DISCONTINUED] hydrochlorothiazide (HYDRODIURIL) 25 MG tablet Take 25 mg by mouth daily.   . [DISCONTINUED] oxybutynin (DITROPAN-XL)  10 MG 24 hr tablet Take 1 tablet (10 mg total) by mouth daily.   . [DISCONTINUED] predniSONE (DELTASONE) 20 MG tablet 3 pills a day with AM with break fast.    No facility-administered encounter medications on file as of 12/27/2017.     Surgical History: Past Surgical History:  Procedure Laterality Date  . ABDOMINAL HYSTERECTOMY  1975  . BLADDER SUSPENSION  1989  . COLONOSCOPY      Medical History: Past Medical History:  Diagnosis Date  . Atrial fibrillation ([place])   . Bronchitis   . Cancer ([place])    Left leg growth,  kidneys, lungs and breasts  . Carcinoma of unknown primary ([place])   . COPD (chronic obstructive pulmonary disease) ([place])   . Diabetes mellitus without complication ([place])   . Hyperlipidemia   . Hypertension   . Pancreatitis   . Pneumonia   . Stroke ([place])   . Vitamin B12 deficiency     Family History: No family history on file.  Social History   Socioeconomic History  . Marital status: Divorced    Spouse name: Not on file  . Number of children: Not on file  . Years of education: Not on file  . Highest education level: Not on file  Social Needs  . Financial resource strain: Not on file  . Food insecurity - worry: Not on file  . Food insecurity - inability: Not on file  . Transportation needs - medical: Not on file  . Transportation needs - non-medical: Not on file  Occupational History  . Not on file  Tobacco Use  . Smoking status: Former Smoker    Packs/day: 0.50    Years: 15.00    Pack years: 7.50    Types: Cigarettes    Last attempt to quit: 11/15/1988    Years since quitting: 29.1  . Smokeless tobacco: Never Used  Substance and Sexual Activity  . Alcohol use: No    Alcohol/week: 0.0 oz  . Drug use: No  . Sexual activity: Not on file    Comment: hysterectomy   Other Topics Concern  . Not on file  Social History Narrative  . Not on file      Review of Systems  Constitutional: Positive for activity change. Negative for chills, fatigue and fever.  HENT: Positive for congestion and sinus pain.   Eyes: Negative.   Respiratory: Positive for cough and wheezing.   Gastrointestinal: Negative for nausea and vomiting.  Endocrine: Negative for cold intolerance, polydipsia and polyphagia.       Blood sugars doing well   Genitourinary: Positive for flank pain and frequency.  Musculoskeletal: Positive for arthralgias, gait problem and myalgias.  Skin: Negative for rash.  Allergic/Immunologic: Positive for environmental allergies.  Neurological: Positive for weakness.  Negative for numbness and headaches.  Hematological: Negative for adenopathy. Does not bruise/bleed easily.  Psychiatric/Behavioral: The patient is not nervous/anxious.      Today's Vitals   12/27/17 1413  BP: 140/80  Pulse: 73  Resp: 16  Temp: 97.9 F (36.6 C)  SpO2: 98%  Weight: 157 lb (71.2 kg)  Height: 5\' 3"  (1.6 m)    Physical Exam  Constitutional: She is oriented to person, place, and time. She appears well-developed and well-nourished. No distress.  HENT:  Head: Normocephalic and atraumatic.  Mouth/Throat: Oropharynx is clear and moist. No oropharyngeal exudate.  Eyes: EOM are normal. Pupils are equal, round, and reactive to light.  Neck: Normal range of motion. Neck supple. No JVD  present. No tracheal deviation present. No thyromegaly present.  Cardiovascular: Normal rate, regular rhythm and normal heart sounds. Exam reveals no gallop and no friction rub.  No murmur heard. Pulmonary/Chest: Effort normal. No respiratory distress. She has no wheezes. She has no rales. She exhibits no tenderness.  Abdominal: Soft. Bowel sounds are normal.  Musculoskeletal: Normal range of motion.  Lymphadenopathy:    She has no cervical adenopathy.  Neurological: She is alert and oriented to person, place, and time. No cranial nerve deficit.  Skin: Skin is warm and dry. She is not diaphoretic.  Psychiatric: She has a normal mood and affect. Her behavior is normal. Judgment and thought content normal.  Nursing note and vitals reviewed.   Assessment/Plan: 1. COPD (chronic obstructive pulmonary disease) with acute bronchitis (HCC) - levofloxacin (LEVAQUIN) 500 MG tablet; Take 1 tablet (500 mg total) by mouth daily.  Dispense: 14 tablet; Refill: 0 - predniSONE (STERAPRED UNI-PAK 48 TAB) 10 MG (48) TBPK tablet; 12 day dose pack - take by mouth as directed for 12 days  Dispense: 48 tablet; Refill: 0  2. Urinary tract infection without hematuria, site unspecified Levofloxacin to cover URI and  UTI.send urine for culture and sensitivity and adjust antibitoics as indicated - CULTURE, URINE COMPREHENSIVE  3. Dysuria - POCT Urinalysis Dipstick  4. Urinary frequency - oxybutynin (DITROPAN-XL) 10 MG 24 hr tablet; Take 1 tablet (10 mg total) by mouth daily.  Dispense: 30 tablet; Refill: 5  5. Uncontrolled type 2 diabetes mellitus with hyperglycemia (HCC) - POCT HgB A1C 5.9 today. No changes made to diabetic medication.   6. Essential hypertension - hydrochlorothiazide (HYDRODIURIL) 25 MG tablet; Take 1 tablet (25 mg total) by mouth daily.  Dispense: 30 tablet; Refill: 5  General Counseling: Jezlyn verbalizes understanding of the findings of todays visit and agrees with plan of treatment. I have discussed any further diagnostic evaluation that may be needed or ordered today. We also reviewed her medications today. she has been encouraged to call the office with any questions or concerns that should arise related to todays visit.  This patient was seen by Leretha Pol, FNP- C in Collaboration with Dr Lavera Guise as a part of collaborative care agreement    Orders Placed This Encounter  Procedures  . CULTURE, URINE COMPREHENSIVE  . POCT Urinalysis Dipstick  . POCT HgB A1C    Meds ordered this encounter  Medications  . hydrochlorothiazide (HYDRODIURIL) 25 MG tablet    Sig: Take 1 tablet (25 mg total) by mouth daily.    Dispense:  30 tablet    Refill:  5    Order Specific Question:   Supervising Provider    Answer:   Lavera Guise [[id number]]  . levofloxacin (LEVAQUIN) 500 MG tablet    Sig: Take 1 tablet (500 mg total) by mouth daily.    Dispense:  14 tablet    Refill:  0    Order Specific Question:   Supervising Provider    Answer:   Lavera Guise [[id number]]  . predniSONE (STERAPRED UNI-PAK 48 TAB) 10 MG (48) TBPK tablet    Sig: 12 day dose pack - take by mouth as directed for 12 days    Dispense:  48 tablet    Refill:  0    Order Specific Question:   Supervising Provider     Answer:   Lavera Guise [place]  . oxybutynin (DITROPAN-XL) 10 MG 24 hr tablet    Sig: Take 1 tablet (10 mg  total) by mouth daily.    Dispense:  30 tablet    Refill:  5    FOR NEXT FILL. [place] YOU    Order Specific Question:   Supervising Provider    Answer:   Lavera Guise [[id number]]    Time spent: 80 Minutes    Dr Lavera Guise Internal medicine

## 2018-01-01 LAB — CULTURE, URINE COMPREHENSIVE

## 2018-01-02 ENCOUNTER — Other Ambulatory Visit: Payer: Self-pay | Admitting: Nurse Practitioner

## 2018-01-02 ENCOUNTER — Telehealth: Payer: Self-pay

## 2018-01-02 DIAGNOSIS — N3001 Acute cystitis with hematuria: Secondary | ICD-10-CM

## 2018-01-02 MED ORDER — NITROFURANTOIN MONOHYD MACRO 100 MG PO CAPS: 100.0000 mg | ORAL_CAPSULE | Freq: Two times a day (BID) | ORAL | 0 refills | Status: DC

## 2018-01-02 NOTE — Telephone Encounter (Signed)
-----   Message from [name], NP sent at 01/02/2018  7:36 AM EST ----- Please let the patient know that Based on urine culture results, added macrobid 100mg  bid for 5 days. Continue levofloxacin as prescribed. thanks

## 2018-01-02 NOTE — Progress Notes (Signed)
Based on urine culture results, added macrobid 100mg  bid for 5 days. Continue levofloxacin as prescribed.

## 2018-01-02 NOTE — Telephone Encounter (Signed)
t vm on phone to inform pt that we added macrobid to take along with the levaquin due to the UA culture results.  dbs

## 2018-01-04 DIAGNOSIS — R319 Hematuria, unspecified: Secondary | ICD-10-CM | POA: Insufficient documentation

## 2018-01-04 DIAGNOSIS — N390 Urinary tract infection, site not specified: Secondary | ICD-10-CM | POA: Insufficient documentation

## 2018-01-06 DIAGNOSIS — I482 Chronic atrial fibrillation: Secondary | ICD-10-CM | POA: Diagnosis not present

## 2018-01-16 ENCOUNTER — Other Ambulatory Visit: Payer: Self-pay | Admitting: Internal Medicine

## 2018-01-16 MED ORDER — GABAPENTIN 300 MG PO CAPS: 600.0000 mg | ORAL_CAPSULE | Freq: Every day | ORAL | 5 refills | Status: DC

## 2018-01-17 ENCOUNTER — Telehealth: Payer: Self-pay

## 2018-01-17 ENCOUNTER — Other Ambulatory Visit: Payer: Self-pay

## 2018-01-17 MED ORDER — PRAVASTATIN SODIUM 40 MG PO TABS: 40.0000 mg | ORAL_TABLET | Freq: Every day | ORAL | 1 refills | Status: DC

## 2018-01-17 NOTE — Telephone Encounter (Signed)
Try to call pt no voice mail setup for her Inr is high 4.7

## 2018-01-17 NOTE — Telephone Encounter (Signed)
Spoke with pt about her inr done on home at 01/14/17 her inr 4.7 pt already skip for day and going to recheck on Friday and also she was on antibiotic as per dfk  Advised pt

## 2018-01-20 DIAGNOSIS — I482 Chronic atrial fibrillation: Secondary | ICD-10-CM | POA: Diagnosis not present

## 2018-01-23 ENCOUNTER — Ambulatory Visit (INDEPENDENT_AMBULATORY_CARE_PROVIDER_SITE_OTHER): Payer: Medicare Other | Admitting: Nurse Practitioner

## 2018-01-23 ENCOUNTER — Encounter: Payer: Self-pay | Admitting: Nurse Practitioner

## 2018-01-23 ENCOUNTER — Other Ambulatory Visit: Payer: Self-pay

## 2018-01-23 ENCOUNTER — Inpatient Hospital Stay
Admission: EM | Admit: 2018-01-23 | Discharge: 2018-01-26 | DRG: 378 | Disposition: A | Discharge status: Home / Self Care | Payer: Medicare Other | Attending: Internal Medicine | Admitting: Internal Medicine

## 2018-01-23 VITALS — BP 130/88 | HR 70 | Resp 16 | Ht 64.0 in | Wt 157.8 lb

## 2018-01-23 DIAGNOSIS — Z7951 Long term (current) use of inhaled steroids: Secondary | ICD-10-CM

## 2018-01-23 DIAGNOSIS — E119 Type 2 diabetes mellitus without complications: Secondary | ICD-10-CM | POA: Diagnosis present

## 2018-01-23 DIAGNOSIS — Z885 Allergy status to narcotic agent status: Secondary | ICD-10-CM

## 2018-01-23 DIAGNOSIS — E538 Deficiency of other specified B group vitamins: Secondary | ICD-10-CM | POA: Diagnosis present

## 2018-01-23 DIAGNOSIS — I4891 Unspecified atrial fibrillation: Secondary | ICD-10-CM

## 2018-01-23 DIAGNOSIS — Z882 Allergy status to sulfonamides status: Secondary | ICD-10-CM

## 2018-01-23 DIAGNOSIS — Z7901 Long term (current) use of anticoagulants: Secondary | ICD-10-CM | POA: Diagnosis not present

## 2018-01-23 DIAGNOSIS — R1084 Generalized abdominal pain: Secondary | ICD-10-CM | POA: Diagnosis not present

## 2018-01-23 DIAGNOSIS — E7800 Pure hypercholesterolemia, unspecified: Secondary | ICD-10-CM | POA: Diagnosis present

## 2018-01-23 DIAGNOSIS — Z8673 Personal history of transient ischemic attack (TIA), and cerebral infarction without residual deficits: Secondary | ICD-10-CM

## 2018-01-23 DIAGNOSIS — G2581 Restless legs syndrome: Secondary | ICD-10-CM | POA: Diagnosis present

## 2018-01-23 DIAGNOSIS — K922 Gastrointestinal hemorrhage, unspecified: Secondary | ICD-10-CM | POA: Diagnosis not present

## 2018-01-23 DIAGNOSIS — J449 Chronic obstructive pulmonary disease, unspecified: Secondary | ICD-10-CM | POA: Diagnosis present

## 2018-01-23 DIAGNOSIS — D62 Acute posthemorrhagic anemia: Secondary | ICD-10-CM | POA: Diagnosis present

## 2018-01-23 DIAGNOSIS — Z886 Allergy status to analgesic agent status: Secondary | ICD-10-CM

## 2018-01-23 DIAGNOSIS — Z87891 Personal history of nicotine dependence: Secondary | ICD-10-CM | POA: Diagnosis not present

## 2018-01-23 DIAGNOSIS — Z888 Allergy status to other drugs, medicaments and biological substances status: Secondary | ICD-10-CM | POA: Diagnosis not present

## 2018-01-23 DIAGNOSIS — D509 Iron deficiency anemia, unspecified: Secondary | ICD-10-CM | POA: Diagnosis present

## 2018-01-23 DIAGNOSIS — K5731 Diverticulosis of large intestine without perforation or abscess with bleeding: Secondary | ICD-10-CM | POA: Diagnosis not present

## 2018-01-23 DIAGNOSIS — Z87442 Personal history of urinary calculi: Secondary | ICD-10-CM | POA: Diagnosis not present

## 2018-01-23 DIAGNOSIS — Z880 Allergy status to penicillin: Secondary | ICD-10-CM | POA: Diagnosis not present

## 2018-01-23 DIAGNOSIS — K921 Melena: Secondary | ICD-10-CM | POA: Diagnosis not present

## 2018-01-23 DIAGNOSIS — E1165 Type 2 diabetes mellitus with hyperglycemia: Secondary | ICD-10-CM | POA: Diagnosis not present

## 2018-01-23 DIAGNOSIS — Z7984 Long term (current) use of oral hypoglycemic drugs: Secondary | ICD-10-CM

## 2018-01-23 DIAGNOSIS — K5791 Diverticulosis of intestine, part unspecified, without perforation or abscess with bleeding: Secondary | ICD-10-CM | POA: Diagnosis not present

## 2018-01-23 DIAGNOSIS — I10 Essential (primary) hypertension: Secondary | ICD-10-CM | POA: Diagnosis present

## 2018-01-23 DIAGNOSIS — E785 Hyperlipidemia, unspecified: Secondary | ICD-10-CM | POA: Diagnosis present

## 2018-01-23 DIAGNOSIS — K641 Second degree hemorrhoids: Secondary | ICD-10-CM | POA: Diagnosis present

## 2018-01-23 DIAGNOSIS — Z86718 Personal history of other venous thrombosis and embolism: Secondary | ICD-10-CM | POA: Diagnosis not present

## 2018-01-23 DIAGNOSIS — Z90710 Acquired absence of both cervix and uterus: Secondary | ICD-10-CM | POA: Diagnosis not present

## 2018-01-23 DIAGNOSIS — K648 Other hemorrhoids: Secondary | ICD-10-CM | POA: Diagnosis not present

## 2018-01-23 LAB — COMPREHENSIVE METABOLIC PANEL
ALBUMIN: 3.7 g/dL (ref 3.5–5.0)
ALK PHOS: 67 U/L (ref 38–126)
ALT: 15 U/L (ref 14–54)
AST: 21 U/L (ref 15–41)
Anion gap: 12 (ref 5–15)
BUN: 27 mg/dL — AB (ref 6–20)
CALCIUM: 9 mg/dL (ref 8.9–10.3)
CO2: 24 mmol/L (ref 22–32)
CREATININE: 0.83 mg/dL (ref 0.44–1.00)
Chloride: 96 mmol/L — ABNORMAL LOW (ref 101–111)
GFR calc Af Amer: >60 mL/min (ref >60)
GFR calc non Af Amer: >60 mL/min (ref >60)
GLUCOSE: 104 mg/dL — AB (ref 65–99)
Potassium: 4 mmol/L (ref 3.5–5.1)
Sodium: 132 mmol/L — ABNORMAL LOW (ref 135–145)
Total Bilirubin: 0.5 mg/dL (ref 0.3–1.2)
Total Protein: 6.3 g/dL — ABNORMAL LOW (ref 6.5–8.1)

## 2018-01-23 LAB — PROTIME-INR
INR: 2.68
Prothrombin Time: 28.3 seconds — ABNORMAL HIGH (ref 11.4–15.2)

## 2018-01-23 LAB — CBC
HCT: 28.7 % — ABNORMAL LOW (ref 35.0–47.0)
HEMOGLOBIN: 9.6 g/dL — AB (ref 12.0–16.0)
MCH: 28.8 pg (ref 26.0–34.0)
MCHC: 33.7 g/dL (ref 32.0–36.0)
MCV: 85.6 fL (ref 80.0–100.0)
PLATELETS: 306 10*3/uL (ref 150–440)
RBC: 3.35 MIL/uL — ABNORMAL LOW (ref 3.80–5.20)
RDW: 13.6 % (ref 11.5–14.5)
WBC: 9 10*3/uL (ref 3.6–11.0)

## 2018-01-23 LAB — POCT INR

## 2018-01-23 LAB — TYPE AND SCREEN
ABO/RH(D): B POS
Antibody Screen: NEGATIVE

## 2018-01-23 MED ORDER — SODIUM CHLORIDE 0.9 % IV SOLN
Freq: Once | INTRAVENOUS | Status: AC
  Administered 2018-01-25: 09:00:00 via INTRAVENOUS

## 2018-01-23 MED ORDER — LORATADINE 10 MG PO TABS
10.0000 mg | ORAL_TABLET | Freq: Every day | ORAL | Status: DC
  Administered 2018-01-26: 09:00:00 10 mg via ORAL
  Filled 2018-01-23: qty 1

## 2018-01-23 MED ORDER — TRAMADOL HCL 50 MG PO TABS: 50.0000 mg | ORAL_TABLET | Freq: Four times a day (QID) | ORAL | Status: DC | PRN

## 2018-01-23 MED ORDER — GABAPENTIN 300 MG PO CAPS
600.0000 mg | ORAL_CAPSULE | Freq: Every day | ORAL | Status: DC
  Administered 2018-01-23 – 2018-01-25 (×3): 600 mg via ORAL
  Filled 2018-01-23 (×3): qty 2

## 2018-01-23 MED ORDER — PANTOPRAZOLE SODIUM 40 MG IV SOLR: 40.0000 mg | Freq: Two times a day (BID) | INTRAVENOUS | Status: DC

## 2018-01-23 MED ORDER — ONDANSETRON HCL 4 MG PO TABS: 4.0000 mg | ORAL_TABLET | Freq: Four times a day (QID) | ORAL | Status: DC | PRN

## 2018-01-23 MED ORDER — PRAVASTATIN SODIUM 20 MG PO TABS
40.0000 mg | ORAL_TABLET | Freq: Every day | ORAL | Status: DC
  Administered 2018-01-24: 40 mg via ORAL
  Filled 2018-01-23 (×2): qty 2

## 2018-01-23 MED ORDER — PANTOPRAZOLE SODIUM 40 MG IV SOLR
40.0000 mg | Freq: Once | INTRAVENOUS | Status: AC
  Administered 2018-01-23: 40 mg via INTRAVENOUS
  Filled 2018-01-23: qty 40

## 2018-01-23 MED ORDER — MONTELUKAST SODIUM 10 MG PO TABS
10.0000 mg | ORAL_TABLET | Freq: Every day | ORAL | Status: DC
  Administered 2018-01-23 – 2018-01-25 (×3): 10 mg via ORAL
  Filled 2018-01-23 (×3): qty 1

## 2018-01-23 MED ORDER — OXYBUTYNIN CHLORIDE ER 10 MG PO TB24
10.0000 mg | ORAL_TABLET | Freq: Every day | ORAL | Status: DC
  Administered 2018-01-26: 10 mg via ORAL
  Filled 2018-01-23 (×3): qty 1

## 2018-01-23 MED ORDER — SODIUM CHLORIDE 0.9 % IV SOLN
INTRAVENOUS | Status: DC
  Administered 2018-01-23 – 2018-01-24 (×3): via INTRAVENOUS

## 2018-01-23 MED ORDER — SODIUM CHLORIDE 0.9 % IV SOLN
80.0000 mg | Freq: Once | INTRAVENOUS | Status: AC
  Administered 2018-01-23: 80 mg via INTRAVENOUS
  Filled 2018-01-23: qty 80

## 2018-01-23 MED ORDER — UMECLIDINIUM-VILANTEROL 62.5-25 MCG/INH IN AEPB
1.0000 | INHALATION_SPRAY | Freq: Every day | RESPIRATORY_TRACT | Status: DC
  Administered 2018-01-24 – 2018-01-26 (×3): 1 via RESPIRATORY_TRACT
  Filled 2018-01-23: qty 14

## 2018-01-23 MED ORDER — ALBUTEROL SULFATE (2.5 MG/3ML) 0.083% IN NEBU: 2.5000 mg | INHALATION_SOLUTION | RESPIRATORY_TRACT | Status: DC | PRN

## 2018-01-23 MED ORDER — THEOPHYLLINE ER 100 MG PO CP24
100.0000 mg | ORAL_CAPSULE | Freq: Every day | ORAL | Status: DC
  Administered 2018-01-26: 09:00:00 100 mg via ORAL
  Filled 2018-01-23 (×3): qty 1

## 2018-01-23 MED ORDER — SODIUM CHLORIDE 0.9 % IV SOLN
8.0000 mg/h | INTRAVENOUS | Status: DC
  Administered 2018-01-23 – 2018-01-24 (×3): 8 mg/h via INTRAVENOUS
  Filled 2018-01-23 (×4): qty 80

## 2018-01-23 MED ORDER — ONDANSETRON HCL 4 MG/2ML IJ SOLN: 4.0000 mg | Freq: Four times a day (QID) | INTRAMUSCULAR | Status: DC | PRN

## 2018-01-23 NOTE — ED Notes (Signed)
Report called to cynthia rn floor nurse 

## 2018-01-23 NOTE — H&P (Signed)
[name] at [place] NAME: Monica [name]    MR#:  [id number]  DATE OF BIRTH:  1925-11-28  DATE OF ADMISSION:  01/23/2018  PRIMARY CARE PHYSICIAN: Monica Freshwater, NP   REQUESTING/REFERRING PHYSICIAN:   CHIEF COMPLAINT:   Chief Complaint  Patient presents with  . GI Bleeding    HISTORY OF PRESENT ILLNESS: Monica [name]  is a 84 y.o. female with a known history of atrial fibrillation, sarcoma, COPD, diabetes, essential hypertension and hyperlipidemia who is presenting with dark colored stool since Sunday.  Patient had a similar type of presentation about a year ago but was thought to have diverticular bleed.  At that time she chose not to have a colonoscopy.  Patient states that she does not have any abdominal pain she has not thrown up any blood.  She is on Coumadin and her INR was 2.68.  Patient otherwise states that she has been feeling well denies any chest pain shortness of breath.        PAST MEDICAL HISTORY:   Past Medical History:  Diagnosis Date  . Atrial fibrillation ([place])   . Bronchitis   . Cancer ([place])    Left leg growth, kidneys, lungs and breasts  . Carcinoma of unknown primary ([place])   . COPD (chronic obstructive pulmonary disease) ([place])   . Diabetes mellitus without complication ([place])   . Hyperlipidemia   . Hypertension   . Pancreatitis   . Pneumonia   . Stroke ([place])   . Vitamin B12 deficiency     PAST SURGICAL HISTORY:  Past Surgical History:  Procedure Laterality Date  . ABDOMINAL HYSTERECTOMY  1975  . BLADDER SUSPENSION  1989  . COLONOSCOPY      SOCIAL HISTORY:  Social History   Tobacco Use  . Smoking status: Former Smoker    Packs/day: 0.50    Years: 15.00    Pack years: 7.50    Types: Cigarettes    Last attempt to quit: 11/15/1988    Years since quitting: 29.2  . Smokeless tobacco: Never Used  Substance Use Topics  . Alcohol use: No    Alcohol/week: 0.0 oz    FAMILY HISTORY: No family history on  file.  DRUG ALLERGIES:  Allergies  Allergen Reactions  . Sulfa Antibiotics Swelling  . Celecoxib Nausea And Vomiting  . Acetaminophen Itching  . Codeine Rash  . Lyrica [Pregabalin] Rash  . Penicillin G Rash  . Petrolatum-Zinc Oxide Rash    REVIEW OF SYSTEMS:   CONSTITUTIONAL: No fever, fatigue or weakness.  EYES: No blurred or double vision.  EARS, NOSE, AND THROAT: No tinnitus or ear pain.  RESPIRATORY: No cough, shortness of breath, wheezing or hemoptysis.  CARDIOVASCULAR: No chest pain, orthopnea, edema.  GASTROINTESTINAL: No nausea, vomiting, diarrhea or abdominal pain. + melena  GENITOURINARY: No dysuria, hematuria.  ENDOCRINE: No polyuria, nocturia,  HEMATOLOGY: No anemia, easy bruising or bleeding SKIN: No rash or lesion. MUSCULOSKELETAL: No joint pain or arthritis.   NEUROLOGIC: No tingling, numbness, weakness.  PSYCHIATRY: No anxiety or depression.   MEDICATIONS AT HOME:  Prior to Admission medications   Medication Sig Start Date End Date Taking? Authorizing Provider  albuterol (PROVENTIL HFA;VENTOLIN HFA) 108 (90 Base) MCG/ACT inhaler Inhale 1 puff into the lungs every 4 (four) hours as needed for wheezing or shortness of breath.    [provider]  amLODipine-benazepril (LOTREL) 5-20 MG capsule TAKE 1 CAPSULE BY MOUTH EVERY DAY 11/23/17   Monica [name]  E, NP  b complex vitamins capsule Take 1 capsule by mouth daily.    [provider]  famotidine (PEPCID) 20 MG tablet Take 1 tablet (20 mg total) by mouth 2 (two) times daily. 08/11/16   Monica Lesches, MD  Fluticasone Furoate-Vilanterol 100-25 MCG/INH AEPB Inhale into the lungs.    [provider]  furosemide (LASIX) 20 MG tablet  05/24/16   [provider]  gabapentin (NEURONTIN) 300 MG capsule Take 2 capsules (600 mg total) by mouth at bedtime. 01/16/18   Monica Freshwater, NP  glipiZIDE (GLUCOTROL XL) 2.5 MG 24 hr tablet TAKE ONE TABLET DAILY 01/16/18   Monica [name] E, NP   glucose blood test strip 1 each by Other route as needed for other. Use as instructed 11/23/17   Monica Gee, MD  hydrochlorothiazide (HYDRODIURIL) 25 MG tablet Take 1 tablet (25 mg total) by mouth daily. 12/27/17   Monica Freshwater, NP  levofloxacin (LEVAQUIN) 500 MG tablet Take 1 tablet (500 mg total) by mouth daily. 12/27/17   Monica Freshwater, NP  loratadine (CLARITIN) 10 MG tablet Take 10 mg by mouth daily.    [provider]  metFORMIN (GLUCOPHAGE) 500 MG tablet TAKE ONE TABLET BY MOUTH TWICE DAILY 04/07/16   Monica Sons, MD  montelukast (SINGULAIR) 10 MG tablet Take 10 mg by mouth at bedtime.    [provider]  nitrofurantoin, macrocrystal-monohydrate, (MACROBID) 100 MG capsule Take 1 capsule (100 mg total) by mouth 2 (two) times daily. 01/02/18   Monica Freshwater, NP  oxybutynin (DITROPAN-XL) 10 MG 24 hr tablet Take 1 tablet (10 mg total) by mouth daily. 12/27/17   Monica Freshwater, NP  pravastatin (PRAVACHOL) 40 MG tablet Take 1 tablet (40 mg total) by mouth daily. 01/17/18   Monica Guise, MD  predniSONE (STERAPRED UNI-PAK 48 TAB) 10 MG (48) TBPK tablet 12 day dose pack - take by mouth as directed for 12 days 12/27/17   Monica Freshwater, NP  theophylline (THEO-24) 100 MG 24 hr capsule Take 100 mg by mouth daily.    [provider]  Umeclidinium-Vilanterol Monica [name] ELLIPTA) 62.5-25 MCG/INH AEPB  12/24/14   [provider]  warfarin (COUMADIN) 2 MG tablet Take 2 mg by mouth. One day a week 11/18/16   [provider]  warfarin (COUMADIN) 4 MG tablet TAKE ONE TABLET EVERY DAY EXCEPT MONDAY TAKE 1/2 TABLET 11/30/17   Monica Freshwater, NP      PHYSICAL EXAMINATION:   VITAL SIGNS: Blood pressure (!) 141/87, pulse 90, temperature 98 F (36.7 C), temperature source Oral, resp. rate 17, SpO2 98 %.  GENERAL:  84 y.o.-year-old patient lying in the bed with no acute distress.  EYES: Pupils equal, round, reactive to light and accommodation. No scleral  icterus. Extraocular muscles intact.  HEENT: Head atraumatic, normocephalic. Oropharynx and nasopharynx clear.  NECK:  Supple, no jugular venous distention. No thyroid enlargement, no tenderness.  LUNGS: Normal breath sounds bilaterally, no wheezing, rales,rhonchi or crepitation. No use of accessory muscles of respiration.  CARDIOVASCULAR: S1, S2 normal. No murmurs, rubs, or gallops.  ABDOMEN: Soft, nontender, nondistended. Bowel sounds present. No organomegaly or mass.  EXTREMITIES: No pedal edema, cyanosis, or clubbing.  NEUROLOGIC: Cranial nerves II through XII are intact. Muscle strength 5/5 in all extremities. Sensation intact. Gait not checked.  PSYCHIATRIC: The patient is alert and oriented x 3.  SKIN: No obvious rash, lesion, or ulcer.   LABORATORY PANEL:   CBC Recent  Labs  Lab 01/23/18 1804  WBC 9.0  HGB 9.6*  HCT 28.7*  PLT 306  MCV 85.6  MCH 28.8  MCHC 33.7  RDW 13.6   ------------------------------------------------------------------------------------------------------------------  Chemistries  Recent Labs  Lab 01/23/18 1804  NA 132*  K 4.0  CL 96*  CO2 24  GLUCOSE 104*  BUN 27*  CREATININE 0.83  CALCIUM 9.0  AST 21  ALT 15  ALKPHOS 67  BILITOT 0.5   ------------------------------------------------------------------------------------------------------------------ estimated creatinine clearance is 44.6 mL/min (by C-G formula based on SCr of 0.83 mg/dL). ------------------------------------------------------------------------------------------------------------------ No results for input(s): TSH, T4TOTAL, T3FREE, THYROIDAB in the last 72 hours.  Invalid input(s): FREET3   Coagulation profile Recent Labs  Lab 01/23/18 1637 01/23/18 1804  INR inr3.5 pt41.7 2.68   ------------------------------------------------------------------------------------------------------------------- No results for input(s): DDIMER in the last 72  hours. -------------------------------------------------------------------------------------------------------------------  Cardiac Enzymes No results for input(s): CKMB, TROPONINI, MYOGLOBIN in the last 168 hours.  Invalid input(s): CK ------------------------------------------------------------------------------------------------------------------ Invalid input(s): POCBNP  ---------------------------------------------------------------------------------------------------------------  Urinalysis    Component Value Date/Time   COLORURINE YELLOW (A) 08/08/2016 1415   APPEARANCEUR CLEAR (A) 08/08/2016 1415   APPEARANCEUR Cloudy 03/04/2014 1535   LABSPEC 1.006 08/08/2016 1415   LABSPEC 1.012 03/04/2014 1535   PHURINE 7.0 08/08/2016 1415   GLUCOSEU NEGATIVE 08/08/2016 1415   GLUCOSEU 50 mg/dL 03/04/2014 1535   HGBUR 1+ (A) 08/08/2016 1415   BILIRUBINUR neg 12/27/2017 1520   BILIRUBINUR Negative 03/04/2014 1535   KETONESUR NEGATIVE 08/08/2016 1415   PROTEINUR neg 12/27/2017 1520   PROTEINUR NEGATIVE 08/08/2016 1415   UROBILINOGEN 0.2 12/27/2017 1520   NITRITE neg 12/27/2017 1520   NITRITE POSITIVE (A) 08/08/2016 1415   LEUKOCYTESUR Moderate (2+) (A) 12/27/2017 1520   LEUKOCYTESUR 3+ 03/04/2014 1535     RADIOLOGY: No results found.  EKG: Orders placed or performed during the hospital encounter of 08/08/16  . ED EKG  . ED EKG    IMPRESSION AND PLAN: Patient is a 84 year old white female who is on chronic anticoagulation for atrial fibrillation with upper GI bleed  1.  Upper GI bleed in the setting of chronic Coumadin therapy I have discussed the case with on-call GI physician Dr. Wilhemena Durie who will be doing endoscopy tomorrow We will keep patient n.p.o. past midnight Started on Protonix drip Due to due to persistent bleeding I will transfuse patient with FFP Monitor blood counts closely transfuse as needed Patient has been consented for transfusion if needed  2.  Essential  hypertension I will hold her blood pressure medications for now  3.  DM2 sliding scale insulin for now hold oral medication  4.  Hyperlipidemia continue Pravachol  5.  Miscellaneous SCDs for DVT prophylaxis  All the records are reviewed and case discussed with ED provider. Management plans discussed with the patient, family and they are in agreement.  CODE STATUS: Code Status History    Date Active Date Inactive Code Status Order ID Comments User Context   08/08/2016 12:00 08/11/2016 12:41 Full Code [id number]  Baxter Hire, MD Inpatient       TOTAL TIME TAKING CARE OF THIS PATIENT: 91minutes.    Dustin Flock M.D on 01/23/2018 at 8:28 PM  Between 7am to 6pm - Pager - [phone]  After 6pm go to [email or web address] - password [email or web address]  [facility] Office  [phone]  CC: Primary care physician; Monica Freshwater, NP

## 2018-01-23 NOTE — ED Triage Notes (Signed)
Pt c/o dark colored stools for the past 2 weeks, states in the past 2 days having more blood in stools and now having bright red blood in stools. Denies any pain, states she has a hx of GI bleeding in the past..

## 2018-01-23 NOTE — ED Provider Notes (Signed)
[name] Provider Note  ____________________________________________   First MD Initiated Contact with Patient 01/23/18 1959     (approximate)  I have reviewed the triage vital signs and the nursing notes.   HISTORY  Chief Complaint GI Bleeding   HPI Monica [name] is a 84 y.o. female who self presents to the emergency department with 2 weeks of intermittent worsening dark stools.  Today they became bright red and so she notified family.  She has no history of GI bleeding in the past.  She does have atrial fibrillation for which she takes Coumadin.  Her symptoms have been intermittent mild to moderate severity.  Nothing particular seemed to make them better or worse.  She does have mild cramping nonradiating lower abdominal discomfort.  She has no dyschezia.  She denies fatigue.  She has never had a blood transfusion before.  Past Medical History:  Diagnosis Date  . Atrial fibrillation ([place])   . Bronchitis   . Cancer ([place])    Left leg growth, kidneys, lungs and breasts  . Carcinoma of unknown primary ([place])   . COPD (chronic obstructive pulmonary disease) ([place])   . Diabetes mellitus without complication ([place])   . Hyperlipidemia   . Hypertension   . Pancreatitis   . Pneumonia   . Stroke ([place])    TIA's  . Vitamin B12 deficiency     Patient Active Problem List   Diagnosis Date Noted  . Melena   . Urinary tract infection without hematuria 01/04/2018  . COPD (chronic obstructive pulmonary disease) with acute bronchitis ([place]) 12/27/2017  . Acute cystitis with hematuria 12/27/2017  . Dysuria 12/27/2017  . Urinary frequency 12/27/2017  . Uncontrolled type 2 diabetes mellitus with hyperglycemia ([place]) 12/27/2017  . Iron deficiency anemia due to chronic blood loss 09/28/2016  . Avitaminosis D 09/09/2016  . B12 deficiency 09/09/2016  . Temporary cerebral vascular dysfunction 09/09/2016  . Spinal stenosis 09/09/2016  . Pain in  shoulder 09/09/2016  . Restless legs syndrome 09/09/2016  . Personal history of urinary calculi 09/09/2016  . H/O deep venous thrombosis 09/09/2016  . Gout 09/09/2016  . Accumulation of fluid in tissues 09/09/2016  . Carpal tunnel syndrome 09/09/2016  . Chronic lung disease 09/09/2016  . Cataract 09/09/2016  . Appendicular ataxia 09/09/2016  . Airway hyperreactivity 09/09/2016  . Rectal bleeding   . Anemia due to blood loss, acute   . GI bleed 08/08/2016  . Carcinoma of unknown primary ([place]) 08/02/2016  . Diabetes ([place]) 05/08/2015  . Arthropathy of hand 02/04/2010  . 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 Detrusor instability of bladder 06/18/2009  . Pure hypercholesterolemia 04/10/2009  . Essential hypertension 04/10/2009  . Diverticulitis of colon 04/10/2009    Past Surgical History:  Procedure Laterality Date  . ABDOMINAL HYSTERECTOMY  1975  . BLADDER SUSPENSION  1989  . COLONOSCOPY    . COLONOSCOPY WITH PROPOFOL N/A 01/25/2018   Procedure: COLONOSCOPY WITH PROPOFOL;  Surgeon: [name], MD;  Location: [facility] ENDOSCOPY;  Service: Endoscopy;  Laterality: N/A;  . ESOPHAGOGASTRODUODENOSCOPY (EGD) WITH PROPOFOL N/A 01/24/2018   Procedure: ESOPHAGOGASTRODUODENOSCOPY (EGD) WITH PROPOFOL;  Surgeon: [name], MD;  Location: [facility] ENDOSCOPY;  Service: Endoscopy;  Laterality: N/A;    Prior to Admission medications   Medication Sig Start Date End Date Taking? Authorizing Provider  amLODipine-benazepril (LOTREL) 5-20 MG capsule TAKE 1 CAPSULE BY MOUTH EVERY DAY 11/23/17  Yes Boscia, Heather E, NP  b complex vitamins capsule Take 1 capsule by  mouth daily.   Yes [provider]  furosemide (LASIX) 20 MG tablet Take 20 mg by mouth daily.  05/24/16  Yes [provider]  gabapentin (NEURONTIN) 300 MG capsule Take 2 capsules (600 mg total) by mouth at bedtime. 01/16/18  Yes Boscia, Heather E, NP  glipiZIDE (GLUCOTROL XL) 2.5 MG 24 hr tablet TAKE ONE TABLET DAILY 01/16/18  Yes  Boscia, Heather E, NP  hydrochlorothiazide (HYDRODIURIL) 25 MG tablet Take 1 tablet (25 mg total) by mouth daily. 12/27/17  Yes Boscia, Greer Ee, NP  loratadine (CLARITIN) 10 MG tablet Take 10 mg by mouth daily.   Yes [provider]  metFORMIN (GLUCOPHAGE) 500 MG tablet TAKE ONE TABLET BY MOUTH TWICE DAILY Patient taking differently: take 750mg  daily 04/07/16  Yes Fisher, Kirstie Peri, MD  montelukast (SINGULAIR) 10 MG tablet Take 10 mg by mouth at bedtime.   Yes [provider]  oxybutynin (DITROPAN-XL) 10 MG 24 hr tablet Take 1 tablet (10 mg total) by mouth daily. 12/27/17  Yes Boscia, Greer Ee, NP  pravastatin (PRAVACHOL) 40 MG tablet Take 1 tablet (40 mg total) by mouth daily. 01/17/18  Yes Lavera Guise, MD  theophylline (THEO-24) 100 MG 24 hr capsule Take 100 mg by mouth daily.   Yes [provider]  albuterol (PROVENTIL HFA;VENTOLIN HFA) 108 (90 Base) MCG/ACT inhaler Inhale 1 puff into the lungs every 4 (four) hours as needed for wheezing or shortness of breath.    [provider]  famotidine (PEPCID) 20 MG tablet Take 1 tablet (20 mg total) by mouth 2 (two) times daily. Patient not taking: Reported on 01/23/2018 08/11/16   Epifanio Lesches, MD  Fluticasone Furoate-Vilanterol 100-25 MCG/INH AEPB Inhale 1 puff into the lungs daily.     [provider]  glucose blood test strip 1 each by Other route as needed for other. Use as instructed 11/23/17   Allyne Gee, MD  Umeclidinium-Vilanterol [name] ELLIPTA) 62.5-25 MCG/INH AEPB Inhale 1 puff into the lungs daily.  12/24/14   [provider]    Allergies Sulfa antibiotics; Celecoxib; Acetaminophen; Codeine; Lyrica [pregabalin]; Penicillin g; and Petrolatum-zinc oxide  History reviewed. No pertinent family history.  Social History Social History   Tobacco Use  . Smoking status: Former Smoker    Packs/day: 0.50    Years: 15.00    Pack years: 7.50    Types: Cigarettes    Last attempt to  quit: 11/15/1988    Years since quitting: 29.2  . Smokeless tobacco: Never Used  Substance Use Topics  . Alcohol use: No    Alcohol/week: 0.0 oz  . Drug use: No    Review of Systems Constitutional: No fever/chills Eyes: No visual changes. ENT: No sore throat. Cardiovascular: Denies chest pain. Respiratory: Denies shortness of breath. Gastrointestinal: Positive for abdominal pain.  No nausea, no vomiting.  No diarrhea.  No constipation. Genitourinary: Negative for dysuria. Musculoskeletal: Negative for back pain. Skin: Negative for rash. Neurological: Negative for headaches, focal weakness or numbness.   ____________________________________________   PHYSICAL EXAM:  VITAL SIGNS: ED Triage Vitals [01/23/18 1750]  Enc Vitals Group     BP (!) 122/50     Pulse Rate 99     Resp 17     Temp 98 F (36.7 C)     Temp Source Oral     SpO2 98 %     Weight      Height      Head Circumference  Peak Flow      Pain Score      Pain Loc      Pain Edu?      Excl. in [place]?     Constitutional: Alert and oriented x4 joking laughing pleasant cooperative speaks in full clear sentences no diaphoresis Eyes: PERRL EOMI. Head: Atraumatic. Nose: No congestion/rhinnorhea. Mouth/Throat: No trismus Neck: No stridor.   Cardiovascular: Normal rate, regular rhythm. Grossly normal heart sounds.  Good peripheral circulation. Respiratory: Normal respiratory effort.  No retractions. Lungs CTAB and moving good air Gastrointestinal: Soft mild diffuse abdominal tenderness with no rebound or guarding no peritonitis no focality Guaiac positive control positive frank melena Musculoskeletal: No lower extremity edema   Neurologic:  Normal speech and language. No gross focal neurologic deficits are appreciated. Skin:  Skin is warm, dry and intact. No rash noted. Psychiatric: Mood and affect are normal. Speech and behavior are normal.    ____________________________________________   DIFFERENTIAL  includes but not limited to  Upper GI bleed, lower GI bleed, anemia, symptomatic anemia ____________________________________________   LABS (all labs ordered are listed, but only abnormal results are displayed)  Labs Reviewed  COMPREHENSIVE METABOLIC PANEL - Abnormal; Notable for the following components:      Result Value   Sodium 132 (*)    Chloride 96 (*)    Glucose, Bld 104 (*)    BUN 27 (*)    Total Protein 6.3 (*)    All other components within normal limits  CBC - Abnormal; Notable for the following components:   RBC 3.35 (*)    Hemoglobin 9.6 (*)    HCT 28.7 (*)    All other components within normal limits  PROTIME-INR - Abnormal; Notable for the following components:   Prothrombin Time 28.3 (*)    All other components within normal limits  CBC - Abnormal; Notable for the following components:   RBC 2.77 (*)    Hemoglobin 7.9 (*)    HCT 23.9 (*)    All other components within normal limits  BASIC METABOLIC PANEL - Abnormal; Notable for the following components:   Glucose, Bld 125 (*)    BUN 24 (*)    All other components within normal limits  HEMOGLOBIN - Abnormal; Notable for the following components:   Hemoglobin 8.7 (*)    All other components within normal limits  PROTIME-INR - Abnormal; Notable for the following components:   Prothrombin Time 19.9 (*)    All other components within normal limits  HEMOGLOBIN AND HEMATOCRIT, BLOOD - Abnormal; Notable for the following components:   Hemoglobin 9.3 (*)    HCT 27.5 (*)    All other components within normal limits  HEMOGLOBIN AND HEMATOCRIT, BLOOD - Abnormal; Notable for the following components:   Hemoglobin 9.3 (*)    HCT 27.6 (*)    All other components within normal limits  CBC - Abnormal; Notable for the following components:   RBC 2.82 (*)    Hemoglobin 8.2 (*)    HCT 24.2 (*)    All other components within normal limits  BASIC METABOLIC PANEL - Abnormal; Notable for the following components:    Glucose, Bld 136 (*)    Calcium 8.4 (*)    All other components within normal limits  GLUCOSE, CAPILLARY - Abnormal; Notable for the following components:   Glucose-Capillary 118 (*)    All other components within normal limits  HEMOGLOBIN - Abnormal; Notable for the following components:   Hemoglobin 9.2 (*)  All other components within normal limits  MRSA PCR SCREENING  POC OCCULT BLOOD, ED  TYPE AND SCREEN  PREPARE FRESH FROZEN PLASMA    Lab work reviewed by me shows a significant drop in her hemoglobin from baseline __________________________________________  EKG   ____________________________________________  RADIOLOGY   ____________________________________________   PROCEDURES  Procedure(s) performed: no  Procedures  Critical Care performed: no  Observation: no ____________________________________________   INITIAL IMPRESSION / ASSESSMENT AND PLAN / ED COURSE  Pertinent labs & imaging results that were available during my care of the patient were reviewed by me and considered in my medical decision making (see chart for details).  The patient arrives hemodynamically stable although with a significant likely upper GI bleed in the setting of anticoagulation.  She has already dropped several points of hemoglobin I do believe she requires inpatient admission.  I placed her on twice a day Protonix and she will require inpatient admission for continued monitoring of her hemoglobin as well as urgent endoscopy versus colonoscopy.  I discussed with the patient and family who verbalized understanding and agreement with the plan.  I then discussed with the hospitalist Dr. Posey Pronto who has graciously agreed to admit the patient to his service.      ____________________________________________   FINAL CLINICAL IMPRESSION(S) / ED DIAGNOSES  Final diagnoses:  Iron deficiency anemia, unspecified iron deficiency anemia type  Gastrointestinal hemorrhage, unspecified  gastrointestinal hemorrhage type      NEW MEDICATIONS STARTED DURING THIS VISIT:  Discharge Medication List as of 01/26/2018  9:58 AM       Note:  This document was prepared using Dragon voice recognition software and may include unintentional dictation errors.     Darel Hong, MD 01/26/18 1334

## 2018-01-23 NOTE — ED Notes (Signed)
Pt reports black stools and bright red rectal bleeding.  Sx for 2 weeks  Intermittent abd pain.  No vomiting.  Pt states she feels weak.  No dizziness.  Pt alert. Family with pt   nsr on monitor.  md at bedside.

## 2018-01-23 NOTE — ED Triage Notes (Signed)
First Nurse Note:  Arrives from [facility] for ED evaluation of rectal bleeding.  Patient has history of GI bleeding and takes coumadin.  Office states INR was 3.5.  Patient is aaox3.  Skin warm and dry. NAD

## 2018-01-23 NOTE — Progress Notes (Signed)
[facility] [place], [place] [address]  Internal MEDICINE  Office Visit Note  Patient Name: Monica [name]  [id number]  [id number]  Date of Service: 01/23/2018  Chief Complaint  Patient presents with  . Rectal Bleeding    few weeks      The patient is here for sick visit. Today, she has noted rectal bleeding. She had one episode when blood nearly filled upt the toilet. This happened over the weekend. She continues to have episodes of bleeding, noticing mostly on toilet tissue. This is happening with every bowel movement since her initial episode. She denies abdominal pain, nausea, vomiting, or diarrhea. She is currently on warfarin.   Pt is here for a sick visit.     Current Medication:  Outpatient Encounter Medications as of 01/23/2018  Medication Sig Note  . levofloxacin (LEVAQUIN) 500 MG tablet Take 1 tablet (500 mg total) by mouth daily.   Marland Kitchen albuterol (PROVENTIL HFA;VENTOLIN HFA) 108 (90 Base) MCG/ACT inhaler Inhale 1 puff into the lungs every 4 (four) hours as needed for wheezing or shortness of breath.   Marland Kitchen amLODipine-benazepril (LOTREL) 5-20 MG capsule TAKE 1 CAPSULE BY MOUTH EVERY DAY   . b complex vitamins capsule Take 1 capsule by mouth daily.   . famotidine (PEPCID) 20 MG tablet Take 1 tablet (20 mg total) by mouth 2 (two) times daily.   . Fluticasone Furoate-Vilanterol 100-25 MCG/INH AEPB Inhale into the lungs.   . furosemide (LASIX) 20 MG tablet  08/16/2016: Received from: External Pharmacy  . gabapentin (NEURONTIN) 300 MG capsule Take 2 capsules (600 mg total) by mouth at bedtime.   Marland Kitchen glipiZIDE (GLUCOTROL XL) 2.5 MG 24 hr tablet TAKE ONE TABLET DAILY   . glucose blood test strip 1 each by Other route as needed for other. Use as instructed   . hydrochlorothiazide (HYDRODIURIL) 25 MG tablet Take 1 tablet (25 mg total) by mouth daily.   Marland Kitchen loratadine (CLARITIN) 10 MG tablet Take 10 mg by mouth daily.   . metFORMIN (GLUCOPHAGE) 500 MG tablet  TAKE ONE TABLET BY MOUTH TWICE DAILY   . montelukast (SINGULAIR) 10 MG tablet Take 10 mg by mouth at bedtime.   . nitrofurantoin, macrocrystal-monohydrate, (MACROBID) 100 MG capsule Take 1 capsule (100 mg total) by mouth 2 (two) times daily.   Marland Kitchen oxybutynin (DITROPAN-XL) 10 MG 24 hr tablet Take 1 tablet (10 mg total) by mouth daily.   . pravastatin (PRAVACHOL) 40 MG tablet Take 1 tablet (40 mg total) by mouth daily.   . predniSONE (STERAPRED UNI-PAK 48 TAB) 10 MG (48) TBPK tablet 12 day dose pack - take by mouth as directed for 12 days   . theophylline (THEO-24) 100 MG 24 hr capsule Take 100 mg by mouth daily.   Marland Kitchen Umeclidinium-Vilanterol (ANORO ELLIPTA) 62.5-25 MCG/INH AEPB    . warfarin (COUMADIN) 2 MG tablet Take 2 mg by mouth. One day a week 11/22/2016: Received from: External Pharmacy  . warfarin (COUMADIN) 4 MG tablet TAKE ONE TABLET EVERY DAY EXCEPT MONDAY TAKE 1/2 TABLET    No facility-administered encounter medications on file as of 01/23/2018.       Medical History: Past Medical History:  Diagnosis Date  . Atrial fibrillation ([place])   . Bronchitis   . Cancer ([place])    Left leg growth, kidneys, lungs and breasts  . Carcinoma of unknown primary ([place])   . COPD (chronic obstructive pulmonary disease) ([place])   . Diabetes mellitus without complication ([place])   .  Hyperlipidemia   . Hypertension   . Pancreatitis   . Pneumonia   . Stroke ([place])   . Vitamin B12 deficiency      Today's Vitals   01/23/18 1629  BP: 130/88  Pulse: 70  Resp: 16  SpO2: 98%  Weight: 157 lb 12.8 oz (71.6 kg)  Height: 5\' 4"  (1.626 m)    Review of Systems  Constitutional: Negative for activity change, chills, fatigue and unexpected weight change.  HENT: Negative for congestion, postnasal drip, rhinorrhea, sneezing and sore throat.   Eyes: Negative.  Negative for redness.  Respiratory: Negative for cough, chest tightness and shortness of breath.   Cardiovascular: Negative for chest pain and palpitations.   Gastrointestinal: Positive for blood in stool. Negative for abdominal pain, constipation, diarrhea, nausea and vomiting.       Rectal bleeding.   Endocrine: Negative for cold intolerance, heat intolerance, polydipsia, polyphagia and polyuria.  Genitourinary: Negative for dysuria, frequency and hematuria.  Musculoskeletal: Negative for arthralgias, back pain, joint swelling and neck pain.  Skin: Negative for rash.  Allergic/Immunologic: Negative for environmental allergies.  Neurological: Negative.  Negative for tremors and numbness.  Hematological: Negative for adenopathy. Does not bruise/bleed easily.  Psychiatric/Behavioral: Negative for behavioral problems (Depression), sleep disturbance and suicidal ideas. The patient is not nervous/anxious.     Physical Exam  Constitutional: She is oriented to person, place, and time. She appears well-developed and well-nourished. No distress.  HENT:  Head: Normocephalic and atraumatic.  Mouth/Throat: Oropharynx is clear and moist. No oropharyngeal exudate.  Eyes: Pupils are equal, round, and reactive to light. EOM are normal.  Neck: Normal range of motion. Neck supple. No JVD present. No tracheal deviation present. No thyromegaly present.  Cardiovascular: Normal rate, regular rhythm and normal heart sounds. Exam reveals no gallop and no friction rub.  No murmur heard. Pulmonary/Chest: Effort normal and breath sounds normal. No respiratory distress. She has no wheezes. She has no rales. She exhibits no tenderness.  Abdominal: Soft. Bowel sounds are normal. There is tenderness.  Musculoskeletal: Normal range of motion.  Lymphadenopathy:    She has no cervical adenopathy.  Neurological: She is alert and oriented to person, place, and time. No cranial nerve deficit.  Skin: Skin is warm and dry. She is not diaphoretic.  Psychiatric: She has a normal mood and affect. Her behavior is normal. Judgment and thought content normal.  Nursing note and vitals  reviewed.  Assessment/Plan: 1. Gastrointestinal hemorrhage, unspecified gastrointestinal hemorrhage type Advised the patient she should be seen in ER due to GI bleeding and taking warfarin. She also has history of prior GI bled.   2. Atrial fibrillation, unspecified type ([place]) - POCT INR is 3.5 today. Slightly supra therapeutic. Advised the patient she should be seen in ER   3. Essential hypertension bp stable. Continue blood pressure medication as prescribed.   4. Uncontrolled type 2 diabetes mellitus with hyperglycemia ([place]) Continue diabetic medication as prescribed.   General Counseling: Samya verbalizes understanding of the findings of todays visit and agrees with plan of treatment. I have discussed any further diagnostic evaluation that may be needed or ordered today. We also reviewed her medications today. she has been encouraged to call the office with any questions or concerns that should arise related to todays visit.    Counseling:  Advised the patient she should be seen in the ER for further evaluation. She has documented history of GI bleeding and is currently on warfarin for chronic a-fib. She has agreed with  this treatment plan and her daughter is going to take her to ER now. Will contact the ER triage to notify them of the patient.     Orders Placed This Encounter  Procedures  . POCT INR    Time spent: 15  Minutes

## 2018-01-24 ENCOUNTER — Inpatient Hospital Stay: Payer: Medicare Other | Admitting: Anesthesiology

## 2018-01-24 ENCOUNTER — Ambulatory Visit: Payer: Self-pay | Admitting: Internal Medicine

## 2018-01-24 ENCOUNTER — Encounter: Payer: Self-pay | Admitting: Certified Registered"

## 2018-01-24 ENCOUNTER — Encounter
Admission: EM | Disposition: A | Discharge status: Home / Self Care | Payer: Self-pay | Source: Home / Self Care | Attending: Internal Medicine

## 2018-01-24 DIAGNOSIS — K922 Gastrointestinal hemorrhage, unspecified: Secondary | ICD-10-CM

## 2018-01-24 DIAGNOSIS — K921 Melena: Secondary | ICD-10-CM

## 2018-01-24 HISTORY — PX: ESOPHAGOGASTRODUODENOSCOPY (EGD) WITH PROPOFOL: SHX5813

## 2018-01-24 LAB — HEMOGLOBIN AND HEMATOCRIT, BLOOD
HCT: 27.5 % — ABNORMAL LOW (ref 35.0–47.0)
HCT: 27.6 % — ABNORMAL LOW (ref 35.0–47.0)
HEMOGLOBIN: 9.3 g/dL — AB (ref 12.0–16.0)
Hemoglobin: 9.3 g/dL — ABNORMAL LOW (ref 12.0–16.0)

## 2018-01-24 LAB — CBC
HEMATOCRIT: 23.9 % — AB (ref 35.0–47.0)
HEMOGLOBIN: 7.9 g/dL — AB (ref 12.0–16.0)
MCH: 28.7 pg (ref 26.0–34.0)
MCHC: 33.3 g/dL (ref 32.0–36.0)
MCV: 86.3 fL (ref 80.0–100.0)
Platelets: 267 10*3/uL (ref 150–440)
RBC: 2.77 MIL/uL — ABNORMAL LOW (ref 3.80–5.20)
RDW: 13.4 % (ref 11.5–14.5)
WBC: 5.7 10*3/uL (ref 3.6–11.0)

## 2018-01-24 LAB — BASIC METABOLIC PANEL
Anion gap: 7 (ref 5–15)
BUN: 24 mg/dL — AB (ref 6–20)
CALCIUM: 8.9 mg/dL (ref 8.9–10.3)
CHLORIDE: 102 mmol/L (ref 101–111)
CO2: 28 mmol/L (ref 22–32)
CREATININE: 0.66 mg/dL (ref 0.44–1.00)
GFR calc Af Amer: >60 mL/min (ref >60)
GFR calc non Af Amer: >60 mL/min (ref >60)
Glucose, Bld: 125 mg/dL — ABNORMAL HIGH (ref 65–99)
Potassium: 3.6 mmol/L (ref 3.5–5.1)
Sodium: 137 mmol/L (ref 135–145)

## 2018-01-24 LAB — PROTIME-INR
INR: 1.71
Prothrombin Time: 19.9 seconds — ABNORMAL HIGH (ref 11.4–15.2)

## 2018-01-24 LAB — HEMOGLOBIN: HEMOGLOBIN: 8.7 g/dL — AB (ref 12.0–16.0)

## 2018-01-24 LAB — MRSA PCR SCREENING: MRSA by PCR: NEGATIVE

## 2018-01-24 SURGERY — ESOPHAGOGASTRODUODENOSCOPY (EGD) WITH PROPOFOL
Anesthesia: General

## 2018-01-24 MED ORDER — PROPOFOL 10 MG/ML IV BOLUS
INTRAVENOUS | Status: AC
  Filled 2018-01-24: qty 40

## 2018-01-24 MED ORDER — PROPOFOL 10 MG/ML IV BOLUS
INTRAVENOUS | Status: DC | PRN
  Administered 2018-01-24: 100 mg via INTRAVENOUS

## 2018-01-24 MED ORDER — LIDOCAINE HCL (CARDIAC) 20 MG/ML IV SOLN
INTRAVENOUS | Status: DC | PRN
  Administered 2018-01-24: 50 mg via INTRAVENOUS

## 2018-01-24 MED ORDER — POLYETHYLENE GLYCOL 3350 17 GM/SCOOP PO POWD
1.0000 | Freq: Once | ORAL | Status: AC
  Administered 2018-01-24: 21:00:00 255 g via ORAL
  Filled 2018-01-24 (×2): qty 255

## 2018-01-24 MED ORDER — LIDOCAINE HCL (PF) 2 % IJ SOLN
INTRAMUSCULAR | Status: AC
  Filled 2018-01-24: qty 10

## 2018-01-24 MED ORDER — IPRATROPIUM-ALBUTEROL 0.5-2.5 (3) MG/3ML IN SOLN: 3.0000 mL | Freq: Once | RESPIRATORY_TRACT | Status: DC

## 2018-01-24 NOTE — Transfer of Care (Signed)
Immediate Anesthesia Transfer of Care Note  Patient: Monica [name]  Procedure(s) Performed: ESOPHAGOGASTRODUODENOSCOPY (EGD) WITH PROPOFOL (N/A )  Patient Location: PACU  Anesthesia Type:General  Level of Consciousness: awake, alert , oriented and patient cooperative  Airway & Oxygen Therapy: Patient Spontanous Breathing  Post-op Assessment: Report given to RN, Post -op Vital signs reviewed and stable and Patient moving all extremities X 4  Post vital signs: Reviewed and stable  Last Vitals:  Vitals:   01/24/18 1426 01/24/18 1428  BP: (!) 131/31 (!) 120/33  Pulse: 67   Resp: 18   Temp: 36.6 C 36.6 C  SpO2: 94%     Last Pain:  Vitals:   01/24/18 1428  TempSrc: Tympanic         Complications: No apparent anesthesia complications

## 2018-01-24 NOTE — Progress Notes (Signed)
Dr. Ree Kida about patient hemo dropping to 7.9. No new given, will continue to monitor.

## 2018-01-24 NOTE — Anesthesia Preprocedure Evaluation (Signed)
Anesthesia Evaluation  Patient identified by MRN, date of birth, ID band Patient awake    Reviewed: Allergy & Precautions, H&P , NPO status , Patient's Chart, lab work & pertinent test results  History of Anesthesia Complications Negative for: history of anesthetic complications  Airway Mallampati: III  TM Distance: <3 FB Neck ROM: limited    Dental  (+) Chipped, Poor Dentition, Missing   Pulmonary neg shortness of breath, asthma , COPD, former smoker,           Cardiovascular Exercise Tolerance: Good hypertension, (-) angina(-) Past MI      Neuro/Psych  Neuromuscular disease CVA, Residual Symptoms negative psych ROS   GI/Hepatic negative GI ROS, Neg liver ROS,   Endo/Other  diabetes, Type 2  Renal/GU negative Renal ROS  negative genitourinary   Musculoskeletal   Abdominal   Peds  Hematology negative hematology ROS (+)   Anesthesia Other Findings Past Medical History: No date: Atrial fibrillation ([facility]) No date: Bronchitis No date: Cancer ([place])     Comment:  Left leg growth, kidneys, lungs and breasts No date: Carcinoma of unknown primary ([place]) No date: COPD (chronic obstructive pulmonary disease) ([facility]) No date: Diabetes mellitus without complication ([facility]) No date: Hyperlipidemia No date: Hypertension No date: Pancreatitis No date: Pneumonia No date: Stroke ([place]) No date: Vitamin B12 deficiency  Past Surgical History: 1975: ABDOMINAL HYSTERECTOMY 1989: BLADDER SUSPENSION No date: COLONOSCOPY  BMI    Body Mass Index:  27.09 kg/m      Reproductive/Obstetrics negative OB ROS                             Anesthesia Physical Anesthesia Plan  ASA: III  Anesthesia Plan: General   Post-op Pain Management:    Induction: Intravenous  PONV Risk Score and Plan: Propofol infusion and TIVA  Airway Management Planned: Natural Airway and Nasal Cannula  Additional Equipment:    Intra-op Plan:   Post-operative Plan:   Informed Consent: I have reviewed the patients History and Physical, chart, labs and discussed the procedure including the risks, benefits and alternatives for the proposed anesthesia with the patient or authorized representative who has indicated his/her understanding and acceptance.   Dental Advisory Given  Plan Discussed with: Anesthesiologist, CRNA and Surgeon  Anesthesia Plan Comments: (Patient consented for risks of anesthesia including but not limited to:  - adverse reactions to medications - risk of intubation if required - damage to teeth, lips or other oral mucosa - sore throat or hoarseness - Damage to heart, brain, lungs or loss of life  Patient voiced understanding.)        Anesthesia Quick Evaluation

## 2018-01-24 NOTE — Anesthesia Postprocedure Evaluation (Signed)
Anesthesia Post Note  Patient: Monica [name]  Procedure(s) Performed: ESOPHAGOGASTRODUODENOSCOPY (EGD) WITH PROPOFOL (N/A )  Patient location during evaluation: Endoscopy Anesthesia Type: General Level of consciousness: awake and alert, oriented and patient cooperative Pain management: pain level controlled Vital Signs Assessment: post-procedure vital signs reviewed and stable Respiratory status: spontaneous breathing and respiratory function stable Cardiovascular status: blood pressure returned to baseline and stable Postop Assessment: no headache, no backache, patient able to bend at knees, no apparent nausea or vomiting and adequate PO intake Anesthetic complications: no     Last Vitals:  Vitals:   01/24/18 1426 01/24/18 1428  BP: (!) 131/31 (!) 120/33  Pulse: 67   Resp: 18   Temp: 36.6 C 36.6 C  SpO2: 94%     Last Pain:  Vitals:   01/24/18 1428  TempSrc: Tympanic                 Craven Crean H Rushton Early

## 2018-01-24 NOTE — Op Note (Signed)
[facility] Patient Name: Monica [name] Procedure Date: 01/24/2018 2:12 PM MRN: [id number] Account #: [id number] Date of Birth: 11-17-1925 Admit Type: Inpatient Age: 84 Room: [facility] ENDO ROOM 1 Gender: Female Note Status: Finalized Procedure:            Upper GI endoscopy Indications:          [place]ena Providers:            [name] MD, MD Referring MD:         No Local Md, MD (Referring MD) Medicines:            Propofol per Anesthesia Complications:        No immediate complications. Procedure:            Pre-Anesthesia Assessment:                       - Prior to the procedure, a History and Physical was                        performed, and patient medications and allergies were                        reviewed. The patient's tolerance of previous                        anesthesia was also reviewed. The risks and benefits of                        the procedure and the sedation options and risks were                        discussed with the patient. All questions were                        answered, and informed consent was obtained. Prior                        Anticoagulants: The patient has taken no previous                        anticoagulant or antiplatelet agents. ASA Grade                        Assessment: II - A patient with mild systemic disease.                        After reviewing the risks and benefits, the patient was                        deemed in satisfactory condition to undergo the                        procedure.                       After obtaining informed consent, the endoscope was                        passed under direct vision. Throughout the procedure,  the patient's blood pressure, pulse, and oxygen                        saturations were monitored continuously. The Endoscope                        was introduced through the mouth, and advanced to the                        second part of  duodenum. The upper GI endoscopy was                        accomplished without difficulty. The patient tolerated                        the procedure well. Findings:      The examined esophagus was normal.      The stomach was normal.      The examined duodenum was normal. Impression:           - Normal esophagus.                       - Normal stomach.                       - Normal examined duodenum.                       - No specimens collected. Recommendation:       - Return patient to hospital ward for ongoing care.                       - Perform a colonoscopy tomorrow. Procedure Code(s):    --- Professional ---                       [phone], Esophagogastroduodenoscopy, flexible, transoral;                        diagnostic, including collection of specimen(s) by                        brushing or washing, when performed (separate procedure) Diagnosis Code(s):    --- Professional ---                       K92.1, Melena (includes Hematochezia) CPT copyright 2016 American Medical Association. All rights reserved. The codes documented in this report are preliminary and upon coder review may  be revised to meet current compliance requirements. [name] MD, MD 01/24/2018 2:21:57 PM This report has been signed electronically. Number of Addenda: 0 Note Initiated On: 01/24/2018 2:12 PM      [facility]

## 2018-01-24 NOTE — Progress Notes (Signed)
[facility] [place] NAME: Monica [name]    MR#:  [id number]  DATE OF BIRTH:  1926/10/04  SUBJECTIVE:  CHIEF COMPLAINT: Patient still having bowel movements with dark blood.  Denies any abdominal pain  REVIEW OF SYSTEMS:  CONSTITUTIONAL: No fever, fatigue or weakness.  EYES: No blurred or double vision.  EARS, NOSE, AND THROAT: No tinnitus or ear pain.  RESPIRATORY: No cough, shortness of breath, wheezing or hemoptysis.  CARDIOVASCULAR: No chest pain, orthopnea, edema.  GASTROINTESTINAL: No nausea, vomiting, diarrhea or abdominal pain.  GENITOURINARY: No dysuria, hematuria.  ENDOCRINE: No polyuria, nocturia,  HEMATOLOGY: No anemia, easy bruising or bleeding SKIN: No rash or lesion. MUSCULOSKELETAL: No joint pain or arthritis.   NEUROLOGIC: No tingling, numbness, weakness.  PSYCHIATRY: No anxiety or depression.   DRUG ALLERGIES:   Allergies  Allergen Reactions  . Sulfa Antibiotics Swelling  . Celecoxib Nausea And Vomiting  . Acetaminophen Itching  . Codeine Rash  . Lyrica [Pregabalin] Rash  . Penicillin G Rash    Has patient had a PCN reaction causing immediate rash, facial/tongue/throat swelling, SOB or lightheadedness with hypotension: Yes Has patient had a PCN reaction causing severe rash involving mucus membranes or skin necrosis: No Has patient had a PCN reaction that required hospitalization: No Has patient had a PCN reaction occurring within the last 10 years: Unknown If all of the above answers are "NO", then may proceed with Cephalosporin use.  Marland Kitchen Petrolatum-Zinc Oxide Rash    VITALS:  Blood pressure (!) 120/33, pulse 67, temperature 97.9 F (36.6 C), temperature source Tympanic, resp. rate 18, height 5\' 4"  (1.626 m), weight 71.6 kg (157 lb 12.8 oz), SpO2 94 %.  PHYSICAL EXAMINATION:  GENERAL:  84 y.o.-year-old patient lying in the bed with no acute distress.  EYES: Pupils equal, round, reactive to light and  accommodation. No scleral icterus. Extraocular muscles intact.  HEENT: Head atraumatic, normocephalic. Oropharynx and nasopharynx clear.  NECK:  Supple, no jugular venous distention. No thyroid enlargement, no tenderness.  LUNGS: Normal breath sounds bilaterally, no wheezing, rales,rhonchi or crepitation. No use of accessory muscles of respiration.  CARDIOVASCULAR: S1, S2 normal. No murmurs, rubs, or gallops.  ABDOMEN: Soft, nontender, nondistended. Bowel sounds present. No organomegaly or mass.  EXTREMITIES: No pedal edema, cyanosis, or clubbing.  NEUROLOGIC: Cranial nerves II through XII are intact. Muscle strength 5/5 in all extremities. Sensation intact. Gait not checked.  PSYCHIATRIC: The patient is alert and oriented x 3.  SKIN: No obvious rash, lesion, or ulcer.    LABORATORY PANEL:   CBC Recent Labs  Lab 01/24/18 0432 01/24/18 1304  WBC 5.7  --   HGB 7.9* 8.7*  HCT 23.9*  --   PLT 267  --    ------------------------------------------------------------------------------------------------------------------  Chemistries  Recent Labs  Lab 01/23/18 1804 01/24/18 0432  NA 132* 137  K 4.0 3.6  CL 96* 102  CO2 24 28  GLUCOSE 104* 125*  BUN 27* 24*  CREATININE 0.83 0.66  CALCIUM 9.0 8.9  AST 21  --   ALT 15  --   ALKPHOS 67  --   BILITOT 0.5  --    ------------------------------------------------------------------------------------------------------------------  Cardiac Enzymes No results for input(s): TROPONINI in the last 168 hours. ------------------------------------------------------------------------------------------------------------------  RADIOLOGY:  No results found.  EKG:   Orders placed or performed during the hospital encounter of 08/08/16  . ED EKG  . ED EKG    ASSESSMENT AND PLAN:    Patient  is a 84 year old white female who is on chronic anticoagulation for atrial fibrillation with upper GI bleed  1.  Upper GI bleed in the setting of  chronic Coumadin therapy Scheduled for EGD today ,n.p.o. past midnight on Protonix drip Hemoglobin 9.6-7.9-8.7 S/p  FFP INR 1.71 today Monitor blood counts closely transfuse as needed  2.  Essential hypertension I will hold her blood pressure medications for now  3.  DM2 sliding scale insulin for now hold oral medication  4.  Hyperlipidemia continue Pravachol  5.  Miscellaneous SCDs for DVT prophylaxis      All the records are reviewed and case discussed with Care Management/Social Workerr. Management plans discussed with the patient, family and they are in agreement.  CODE STATUS: fc   TOTAL TIME TAKING CARE OF THIS PATIENT: 36  minutes.   POSSIBLE D/C IN 36 DAYS, DEPENDING ON CLINICAL CONDITION.  Note: This dictation was prepared with Dragon dictation along with smaller phrase technology. Any transcriptional errors that result from this process are unintentional.   Nicholes Mango M.D on 01/24/2018 at 3:13 PM  Between 7am to 6pm - Pager - [phone] After 6pm go to [email or web address] - password [email or web address] [facility]  Office  [phone]  CC: Primary care physician; [name], NP

## 2018-01-24 NOTE — Plan of Care (Signed)
  Progressing Education: Knowledge of General Education information will improve 01/24/2018 1827 - Progressing by Daylene Posey, RN Health Behavior/Discharge Planning: Ability to manage health-related needs will improve 01/24/2018 1827 - Progressing by Daylene Posey, RN Clinical Measurements: Ability to maintain clinical measurements within normal limits will improve 01/24/2018 1827 - Progressing by Daylene Posey, RN Will remain free from infection 01/24/2018 1827 - Progressing by Daylene Posey, RN Diagnostic test results will improve 01/24/2018 1827 - Progressing by Daylene Posey, RN Respiratory complications will improve 01/24/2018 1827 - Progressing by Daylene Posey, RN Cardiovascular complication will be avoided 01/24/2018 1827 - Progressing by Daylene Posey, RN Activity: Risk for activity intolerance will decrease 01/24/2018 1827 - Progressing by Daylene Posey, RN Nutrition: Adequate nutrition will be maintained 01/24/2018 1827 - Progressing by Daylene Posey, RN Coping: Level of anxiety will decrease 01/24/2018 1827 - Progressing by Daylene Posey, RN Elimination: Will not experience complications related to bowel motility 01/24/2018 1827 - Progressing by Daylene Posey, RN Will not experience complications related to urinary retention 01/24/2018 1827 - Progressing by Daylene Posey, RN Pain Managment: General experience of comfort will improve 01/24/2018 1827 - Progressing by Daylene Posey, RN Safety: Ability to remain free from injury will improve 01/24/2018 1827 - Progressing by Daylene Posey, RN Skin Integrity: Risk for impaired skin integrity will decrease 01/24/2018 1827 - Progressing by Daylene Posey, RN

## 2018-01-24 NOTE — Consult Note (Signed)
Monica Lame, MD [facility]  [address], [place], [place] [address] Phone: [phone] Fax : [phone]  Consultation  Referring Provider:     Dr. Posey Pronto Primary Care Physician:  [name], NP Primary Gastroenterologist:  Dr. Allen Norris         Reason for Consultation:     GI bleed  Date of Admission:  01/23/2018 Date of Consultation:  01/24/2018         HPI:   Monica [name] is a 84 y.o. female who comes in with what she reports to be dark stools that started 3 days ago.  The patient states that the stools have been dark in color.  She also reports that she had a similar presentation a year ago and it was thought to be diverticular although at that time the patient had bright red blood per rectum and refused to have a colonoscopy at that time.  The patient is on blood thinners and her INR on admission was 2.6 with a repeat this morning of 1.7.  The patient denies any abdominal pain nausea vomiting fevers or chills.  The patient does have a history of sarcoma which she has not received treatment for.   Past Medical History:  Diagnosis Date  . Atrial fibrillation ([place])   . Bronchitis   . Cancer ([place])    Left leg growth, kidneys, lungs and breasts  . Carcinoma of unknown primary ([place])   . COPD (chronic obstructive pulmonary disease) ([place])   . Diabetes mellitus without complication ([place])   . Hyperlipidemia   . Hypertension   . Pancreatitis   . Pneumonia   . Stroke ([place])   . Vitamin B12 deficiency     Past Surgical History:  Procedure Laterality Date  . ABDOMINAL HYSTERECTOMY  1975  . BLADDER SUSPENSION  1989  . COLONOSCOPY      Prior to Admission medications   Medication Sig Start Date End Date Taking? Authorizing Provider  amLODipine-benazepril (LOTREL) 5-20 MG capsule TAKE 1 CAPSULE BY MOUTH EVERY DAY 11/23/17  Yes Boscia, Heather E, NP  b complex vitamins capsule Take 1 capsule by mouth daily.   Yes [provider]  furosemide (LASIX) 20 MG tablet Take  20 mg by mouth daily.  05/24/16  Yes [provider]  gabapentin (NEURONTIN) 300 MG capsule Take 2 capsules (600 mg total) by mouth at bedtime. 01/16/18  Yes Boscia, Heather E, NP  glipiZIDE (GLUCOTROL XL) 2.5 MG 24 hr tablet TAKE ONE TABLET DAILY 01/16/18  Yes Boscia, Heather E, NP  hydrochlorothiazide (HYDRODIURIL) 25 MG tablet Take 1 tablet (25 mg total) by mouth daily. 12/27/17  Yes Boscia, Greer Ee, NP  loratadine (CLARITIN) 10 MG tablet Take 10 mg by mouth daily.   Yes [provider]  metFORMIN (GLUCOPHAGE) 500 MG tablet TAKE ONE TABLET BY MOUTH TWICE DAILY Patient taking differently: take 750mg  daily 04/07/16  Yes Fisher, Kirstie Peri, MD  montelukast (SINGULAIR) 10 MG tablet Take 10 mg by mouth at bedtime.   Yes [provider]  oxybutynin (DITROPAN-XL) 10 MG 24 hr tablet Take 1 tablet (10 mg total) by mouth daily. 12/27/17  Yes Boscia, Greer Ee, NP  pravastatin (PRAVACHOL) 40 MG tablet Take 1 tablet (40 mg total) by mouth daily. 01/17/18  Yes Lavera Guise, MD  theophylline (THEO-24) 100 MG 24 hr capsule Take 100 mg by mouth daily.   Yes [provider]  warfarin (COUMADIN) 4 MG tablet TAKE ONE TABLET EVERY DAY  EXCEPT MONDAY TAKE 1/2 TABLET 11/30/17  Yes Boscia, Heather E, NP  albuterol (PROVENTIL HFA;VENTOLIN HFA) 108 (90 Base) MCG/ACT inhaler Inhale 1 puff into the lungs every 4 (four) hours as needed for wheezing or shortness of breath.    [provider]  famotidine (PEPCID) 20 MG tablet Take 1 tablet (20 mg total) by mouth 2 (two) times daily. Patient not taking: Reported on 01/23/2018 08/11/16   Epifanio Lesches, MD  Fluticasone Furoate-Vilanterol 100-25 MCG/INH AEPB Inhale 1 puff into the lungs daily.     [provider]  glucose blood test strip 1 each by Other route as needed for other. Use as instructed 11/23/17   Allyne Gee, MD  levofloxacin (LEVAQUIN) 500 MG tablet Take 1 tablet (500 mg total) by mouth daily. Patient not taking:  Reported on 01/23/2018 12/27/17   [name], NP  nitrofurantoin, macrocrystal-monohydrate, (MACROBID) 100 MG capsule Take 1 capsule (100 mg total) by mouth 2 (two) times daily. Patient not taking: Reported on 01/23/2018 01/02/18   [name], NP  predniSONE (STERAPRED UNI-PAK 48 TAB) 10 MG (48) TBPK tablet 12 day dose pack - take by mouth as directed for 12 days Patient not taking: Reported on 01/23/2018 12/27/17   [name], NP  Umeclidinium-Vilanterol (ANORO ELLIPTA) 62.5-25 MCG/INH AEPB Inhale 1 puff into the lungs daily.  12/24/14   [provider]    History reviewed. No pertinent family history.   Social History   Tobacco Use  . Smoking status: Former Smoker    Packs/day: 0.50    Years: 15.00    Pack years: 7.50    Types: Cigarettes    Last attempt to quit: 11/15/1988    Years since quitting: 29.2  . Smokeless tobacco: Never Used  Substance Use Topics  . Alcohol use: No    Alcohol/week: 0.0 oz  . Drug use: No    Allergies as of 01/23/2018 - Review Complete 01/23/2018  Allergen Reaction Noted  . Sulfa antibiotics Swelling 08/08/2016  . Celecoxib Nausea And Vomiting 12/09/2015  . Acetaminophen Itching 12/09/2015  . Codeine Rash 12/09/2015  . Lyrica [pregabalin] Rash 01/23/2018  . Penicillin g Rash 12/09/2015  . Petrolatum-zinc oxide Rash 12/09/2015    Review of Systems:    All systems reviewed and negative except where noted in HPI.   Physical Exam:  Vital signs in last 24 hours: Temp:  [97.5 F (36.4 C)-98.4 F (36.9 C)] 98.1 F (36.7 C) (03/12 1404) Pulse Rate:  [70-99] 94 (03/12 1404) Resp:  [16-20] 20 (03/12 1404) BP: (120-157)/(44-88) 154/63 (03/12 1404) SpO2:  [94 %-100 %] 100 % (03/12 1404) Weight:  [157 lb 12.8 oz (71.6 kg)] 157 lb 12.8 oz (71.6 kg) (03/11 2212) Last BM Date: 01/24/18 General:   Pleasant, cooperative in NAD Head:  Normocephalic and atraumatic. Eyes:   No icterus.   Conjunctiva pink. PERRLA. Ears:  Normal  auditory acuity. Neck:  Supple; no masses or thyroidomegaly Lungs: Respirations even and unlabored. Lungs clear to auscultation bilaterally.   No wheezes, crackles, or rhonchi.  Heart:  Regular rate and rhythm;  Without murmur, clicks, rubs or gallops Abdomen:  Soft, nondistended, nontender. Normal bowel sounds. No appreciable masses or hepatomegaly.  No rebound or guarding.  Rectal:  Not performed. Msk:  Symmetrical without gross deformities.    Extremities:  Without edema, cyanosis or clubbing. Neurologic:  Alert and oriented x3;  grossly normal neurologically. Skin:  Intact without significant lesions or rashes. Cervical Nodes:  No significant  cervical adenopathy. Psych:  Alert and cooperative. Normal affect.  LAB RESULTS: Recent Labs    01/23/18 1804 01/24/18 0432 01/24/18 1304  WBC 9.0 5.7  --   HGB 9.6* 7.9* 8.7*  HCT 28.7* 23.9*  --   PLT 306 267  --    BMET Recent Labs    01/23/18 1804 01/24/18 0432  NA 132* 137  K 4.0 3.6  CL 96* 102  CO2 24 28  GLUCOSE 104* 125*  BUN 27* 24*  CREATININE 0.83 0.66  CALCIUM 9.0 8.9   LFT Recent Labs    01/23/18 1804  PROT 6.3*  ALBUMIN 3.7  AST 21  ALT 15  ALKPHOS 67  BILITOT 0.5   PT/INR Recent Labs    01/23/18 1804 01/24/18 0901  LABPROT 28.3* 19.9*  INR 2.68 1.71    STUDIES: No results found.    Impression / Plan:   Monica [name] is a 84 y.o. y/o female with with a GI bleed.  The patient states the stools are dark and it has been going on for the last 3 days.  The patient will be set up for an EGD.  The patient has also been told that if the upper endoscopy is negative then she should undergo a colonoscopy. I have discussed risks & benefits which include, but are not limited to, bleeding, infection, perforation & drug reaction.  The patient agrees with this plan & written consent will be obtained.     Thank you for involving me in the care of this patient.      LOS: 1 day   Monica Lame, MD  01/24/2018,  2:27 PM   Note: This dictation was prepared with Dragon dictation along with smaller phrase technology. Any transcriptional errors that result from this process are unintentional.

## 2018-01-24 NOTE — Anesthesia Post-op Follow-up Note (Signed)
Anesthesia QCDR form completed.        

## 2018-01-25 ENCOUNTER — Inpatient Hospital Stay: Payer: Medicare Other | Admitting: Anesthesiology

## 2018-01-25 ENCOUNTER — Encounter: Payer: Self-pay | Admitting: Anesthesiology

## 2018-01-25 ENCOUNTER — Encounter
Admission: EM | Disposition: A | Discharge status: Home / Self Care | Payer: Self-pay | Source: Home / Self Care | Attending: Internal Medicine

## 2018-01-25 HISTORY — PX: COLONOSCOPY WITH PROPOFOL: SHX5780

## 2018-01-25 LAB — BPAM FFP
Blood Product Expiration Date: 201903162359
Blood Product Expiration Date: 201903162359
ISSUE DATE / TIME: 201903120100
ISSUE DATE / TIME: 201903112330
UNIT TYPE AND RH: 7300
UNIT TYPE AND RH: 7300

## 2018-01-25 LAB — BASIC METABOLIC PANEL
Anion gap: 8 (ref 5–15)
BUN: 11 mg/dL (ref 6–20)
CALCIUM: 8.4 mg/dL — AB (ref 8.9–10.3)
CHLORIDE: 105 mmol/L (ref 101–111)
CO2: 23 mmol/L (ref 22–32)
CREATININE: 0.63 mg/dL (ref 0.44–1.00)
Glucose, Bld: 136 mg/dL — ABNORMAL HIGH (ref 65–99)
Potassium: 4.2 mmol/L (ref 3.5–5.1)
SODIUM: 136 mmol/L (ref 135–145)

## 2018-01-25 LAB — PREPARE FRESH FROZEN PLASMA
UNIT DIVISION: 0
Unit division: 0

## 2018-01-25 LAB — CBC
HCT: 24.2 % — ABNORMAL LOW (ref 35.0–47.0)
HEMOGLOBIN: 8.2 g/dL — AB (ref 12.0–16.0)
MCH: 29.3 pg (ref 26.0–34.0)
MCHC: 34.1 g/dL (ref 32.0–36.0)
MCV: 85.9 fL (ref 80.0–100.0)
Platelets: 257 10*3/uL (ref 150–440)
RBC: 2.82 MIL/uL — ABNORMAL LOW (ref 3.80–5.20)
RDW: 13.5 % (ref 11.5–14.5)
WBC: 6 10*3/uL (ref 3.6–11.0)

## 2018-01-25 LAB — GLUCOSE, CAPILLARY: GLUCOSE-CAPILLARY: 118 mg/dL — AB (ref 65–99)

## 2018-01-25 SURGERY — COLONOSCOPY WITH PROPOFOL
Anesthesia: General

## 2018-01-25 MED ORDER — PANTOPRAZOLE SODIUM 40 MG PO TBEC
40.0000 mg | DELAYED_RELEASE_TABLET | Freq: Every day | ORAL | Status: DC
  Administered 2018-01-26: 09:00:00 40 mg via ORAL
  Filled 2018-01-25: qty 1

## 2018-01-25 MED ORDER — PROPOFOL 500 MG/50ML IV EMUL
INTRAVENOUS | Status: DC | PRN
  Administered 2018-01-25: 125 ug/kg/min via INTRAVENOUS

## 2018-01-25 MED ORDER — PROPOFOL 10 MG/ML IV BOLUS
INTRAVENOUS | Status: DC | PRN
  Administered 2018-01-25: 40 mg via INTRAVENOUS

## 2018-01-25 MED ORDER — LIDOCAINE HCL (CARDIAC) 20 MG/ML IV SOLN
INTRAVENOUS | Status: DC | PRN
  Administered 2018-01-25: 50 mg via INTRAVENOUS

## 2018-01-25 NOTE — Progress Notes (Signed)
Patient is preop colonoscopy. Patient bowel prep was administered and consent form signed. Patient H/H remained stable at this time . Patient has multiple large bowel movement. Denied being in pain, and assisted as needed. Protonix and maintenance fluid infusing overnight.

## 2018-01-25 NOTE — Progress Notes (Signed)
[facility] [place] NAME: Monica [name]    MR#:  [id number]  DATE OF BIRTH:  1926/10/04  SUBJECTIVE:   Patient had colonoscopy done earlier which shows diverticula with bleed, which clipped Patient insist on going home tomorrow by 10 AM  REVIEW OF SYSTEMS:  CONSTITUTIONAL: No fever, fatigue or weakness.  EYES: No blurred or double vision.  EARS, NOSE, AND THROAT: No tinnitus or ear pain.  RESPIRATORY: No cough, shortness of breath, wheezing or hemoptysis.  CARDIOVASCULAR: No chest pain, orthopnea, edema.  GASTROINTESTINAL: No nausea, vomiting, diarrhea or abdominal pain.  GENITOURINARY: No dysuria, hematuria.  ENDOCRINE: No polyuria, nocturia,  HEMATOLOGY: No anemia, easy bruising or bleeding SKIN: No rash or lesion. MUSCULOSKELETAL: No joint pain or arthritis.   NEUROLOGIC: No tingling, numbness, weakness.  PSYCHIATRY: No anxiety or depression.   DRUG ALLERGIES:   Allergies  Allergen Reactions  . Sulfa Antibiotics Swelling  . Celecoxib Nausea And Vomiting  . Acetaminophen Itching  . Codeine Rash  . Lyrica [Pregabalin] Rash  . Penicillin G Rash    Has patient had a PCN reaction causing immediate rash, facial/tongue/throat swelling, SOB or lightheadedness with hypotension: Yes Has patient had a PCN reaction causing severe rash involving mucus membranes or skin necrosis: No Has patient had a PCN reaction that required hospitalization: No Has patient had a PCN reaction occurring within the last 10 years: Unknown If all of the above answers are "NO", then may proceed with Cephalosporin use.  Marland Kitchen Petrolatum-Zinc Oxide Rash    VITALS:  Blood pressure (!) 119/53, pulse 81, temperature (!) 97 F (36.1 C), temperature source Tympanic, resp. rate 19, height 5\' 4"  (1.626 m), weight 157 lb 12.8 oz (71.6 kg), SpO2 95 %.  PHYSICAL EXAMINATION:  GENERAL:  84 y.o.-year-old patient lying in the bed with no acute distress.  EYES: Pupils  equal, round, reactive to light and accommodation. No scleral icterus. Extraocular muscles intact.  HEENT: Head atraumatic, normocephalic. Oropharynx and nasopharynx clear.  NECK:  Supple, no jugular venous distention. No thyroid enlargement, no tenderness.  LUNGS: Normal breath sounds bilaterally, no wheezing, rales,rhonchi or crepitation. No use of accessory muscles of respiration.  CARDIOVASCULAR: S1, S2 normal. No murmurs, rubs, or gallops.  ABDOMEN: Soft, nontender, nondistended. Bowel sounds present. No organomegaly or mass.  EXTREMITIES: No pedal edema, cyanosis, or clubbing.  NEUROLOGIC: Cranial nerves II through XII are intact. Muscle strength 5/5 in all extremities. Sensation intact. Gait not checked.  PSYCHIATRIC: The patient is alert and oriented x 3.  SKIN: No obvious rash, lesion, or ulcer.    LABORATORY PANEL:   CBC Recent Labs  Lab 01/25/18 0346  WBC 6.0  HGB 8.2*  HCT 24.2*  PLT 257   ------------------------------------------------------------------------------------------------------------------  Chemistries  Recent Labs  Lab 01/23/18 1804  01/25/18 0346  NA 132*   < > 136  K 4.0   < > 4.2  CL 96*   < > 105  CO2 24   < > 23  GLUCOSE 104*   < > 136*  BUN 27*   < > 11  CREATININE 0.83   < > 0.63  CALCIUM 9.0   < > 8.4*  AST 21  --   --   ALT 15  --   --   ALKPHOS 67  --   --   BILITOT 0.5  --   --    < > = values in this interval not displayed.   ------------------------------------------------------------------------------------------------------------------  Cardiac Enzymes No results for input(s): TROPONINI in the last 168 hours. ------------------------------------------------------------------------------------------------------------------  RADIOLOGY:  No results found.  EKG:   Orders placed or performed during the hospital encounter of 08/08/16  . ED EKG  . ED EKG    ASSESSMENT AND PLAN:    Patient is a 84 year old white female  who is on chronic anticoagulation for atrial fibrillation with upper GI bleed  1.  Upper GI bleed in the setting of chronic Coumadin therapy Discontinue IV Protonix Oral Protonix S/p  FFP Colonoscopy with diverticuli with active bleed which was clipped  2.  Essential hypertension continue to hold hold her blood pressure medications for now  3.  DM2 sliding scale insulin for now hold oral medication  4.  Hyperlipidemia continue Pravachol  5.  Miscellaneous SCDs for DVT prophylaxis      All the records are reviewed and case discussed with Care Management/Social Workerr. Management plans discussed with the patient, family and they are in agreement.  CODE STATUS: fc   TOTAL TIME TAKING CARE OF THIS PATIENT: 34 minutes.   POSSIBLE D/C IN 36 DAYS, DEPENDING ON CLINICAL CONDITION.  Note: This dictation was prepared with Dragon dictation along with smaller phrase technology. Any transcriptional errors that result from this process are unintentional.   Dustin Flock M.D on 01/25/2018 at 2:24 PM  Between 7am to 6pm - Pager - [phone] After 6pm go to [email or web address] - password [email or web address] [facility]  Office  [phone]  CC: Primary care physician; [name], NP

## 2018-01-25 NOTE — Anesthesia Postprocedure Evaluation (Signed)
Anesthesia Post Note  Patient: Monica [name]  Procedure(s) Performed: COLONOSCOPY WITH PROPOFOL (N/A )  Patient location during evaluation: Endoscopy Anesthesia Type: General Level of consciousness: awake and alert Pain management: pain level controlled Vital Signs Assessment: post-procedure vital signs reviewed and stable Respiratory status: spontaneous breathing, nonlabored ventilation, respiratory function stable and patient connected to nasal cannula oxygen Cardiovascular status: blood pressure returned to baseline and stable Postop Assessment: no apparent nausea or vomiting Anesthetic complications: no     Last Vitals:  Vitals:   01/25/18 1020 01/25/18 1101  BP: 112/79 (!) 119/53  Pulse: 82 81  Resp: 19   Temp:    SpO2: 100% 95%    Last Pain:  Vitals:   01/25/18 1000  TempSrc: Tympanic                 Precious Haws Piscitello

## 2018-01-25 NOTE — Op Note (Signed)
[facility] Gastroenterology Patient Name: Monica [name] Procedure Date: 01/25/2018 9:06 AM MRN: [id number] Account #: [id number] Date of Birth: 07/07/1926 Admit Type: Inpatient Age: 84 Room: [facility] ENDO ROOM 1 Gender: Female Note Status: Finalized Procedure:            Colonoscopy Indications:          Hematochezia Providers:            [name] MD, MD Medicines:            Propofol per Anesthesia Complications:        No immediate complications. Procedure:            Pre-Anesthesia Assessment:                       - Prior to the procedure, a History and Physical was                        performed, and patient medications and allergies were                        reviewed. The patient's tolerance of previous                        anesthesia was also reviewed. The risks and benefits of                        the procedure and the sedation options and risks were                        discussed with the patient. All questions were                        answered, and informed consent was obtained. Prior                        Anticoagulants: The patient has taken no previous                        anticoagulant or antiplatelet agents. ASA Grade                        Assessment: II - A patient with mild systemic disease.                        After reviewing the risks and benefits, the patient was                        deemed in satisfactory condition to undergo the                        procedure.                       After obtaining informed consent, the colonoscope was                        passed under direct vision. Throughout the procedure,                        the patient's blood pressure, pulse, and oxygen  saturations were monitored continuously. The                        Colonoscope was introduced through the anus and                        advanced to the the cecum, identified by appendiceal   orifice and ileocecal valve. The colonoscopy was                        performed without difficulty. The patient tolerated the                        procedure well. The quality of the bowel preparation                        was poor. Findings:      The perianal and digital rectal examinations were normal.      Multiple small-mouthed diverticula were found in the sigmoid colon and       descending colon.      One small-mouthed diverticulum was found in the sigmoid colon that was       bleeding. To stop active bleeding, one hemostatic clip was successfully       placed (MR conditional). There was no bleeding at the end of the       procedure.      Non-bleeding internal hemorrhoids were found during retroflexion. The       hemorrhoids were Grade II (internal hemorrhoids that prolapse but reduce       spontaneously). Impression:           - Preparation of the colon was poor.                       - Diverticulosis in the sigmoid colon and in the                        descending colon.                       - Diverticulosis in the sigmoid colon that was                        bleeding. Clip (MR conditional) was placed.                       - Non-bleeding internal hemorrhoids.                       - No specimens collected. Recommendation:       - Return patient to hospital ward for ongoing care.                       - Advance diet as tolerated.                       - Continue present medications. Procedure Code(s):    --- Professional ---                       [phone], Colonoscopy, flexible; with control of bleeding,  any method Diagnosis Code(s):    --- Professional ---                       K92.1, Melena (includes Hematochezia)                       K57.30, Diverticulosis of large intestine without                        perforation or abscess without bleeding CPT copyright 2016 American Medical Association. All rights reserved. The codes documented in this  report are preliminary and upon coder review may  be revised to meet current compliance requirements. [name] MD, MD 01/25/2018 10:03:26 AM This report has been signed electronically. Number of Addenda: 0 Note Initiated On: 01/25/2018 9:06 AM Scope Withdrawal Time: 0 hours 9 minutes 53 seconds  Total Procedure Duration: 0 hours 24 minutes 35 seconds       [facility]

## 2018-01-25 NOTE — Anesthesia Post-op Follow-up Note (Signed)
Anesthesia QCDR form completed.        

## 2018-01-25 NOTE — Progress Notes (Signed)
Patient alert and oriented, vss, no complaints of pain. Patient has been incontinent of bowel and bladder today.  She refused to take her oxybutinin and several additional medications.  She is firm in saying that she wants to leave hospital tomorrow morning as early as possible and if hospitialist does not come by 10am, she will check out AMA.

## 2018-01-25 NOTE — Anesthesia Preprocedure Evaluation (Signed)
Anesthesia Evaluation  Patient identified by MRN, date of birth, ID band Patient awake    Reviewed: Allergy & Precautions, H&P , NPO status , Patient's Chart, lab work & pertinent test results  History of Anesthesia Complications Negative for: history of anesthetic complications  Airway Mallampati: III  TM Distance: <3 FB Neck ROM: limited    Dental  (+) Chipped, Poor Dentition, Missing   Pulmonary neg shortness of breath, asthma , COPD, former smoker,           Cardiovascular Exercise Tolerance: Good hypertension, (-) angina(-) Past MI      Neuro/Psych  Neuromuscular disease CVA, Residual Symptoms negative psych ROS   GI/Hepatic negative GI ROS, Neg liver ROS,   Endo/Other  diabetes, Type 2  Renal/GU negative Renal ROS  negative genitourinary   Musculoskeletal   Abdominal   Peds  Hematology negative hematology ROS (+)   Anesthesia Other Findings Past Medical History: No date: Atrial fibrillation ([facility]) No date: Bronchitis No date: Cancer ([place])     Comment:  Left leg growth, kidneys, lungs and breasts No date: Carcinoma of unknown primary ([place]) No date: COPD (chronic obstructive pulmonary disease) ([facility]) No date: Diabetes mellitus without complication ([facility]) No date: Hyperlipidemia No date: Hypertension No date: Pancreatitis No date: Pneumonia No date: Stroke ([place]) No date: Vitamin B12 deficiency  Past Surgical History: 1975: ABDOMINAL HYSTERECTOMY 1989: BLADDER SUSPENSION No date: COLONOSCOPY  BMI    Body Mass Index:  27.09 kg/m      Reproductive/Obstetrics negative OB ROS                             Anesthesia Physical  Anesthesia Plan  ASA: III  Anesthesia Plan: General   Post-op Pain Management:    Induction: Intravenous  PONV Risk Score and Plan: Propofol infusion and TIVA  Airway Management Planned: Natural Airway and Nasal Cannula  Additional Equipment:    Intra-op Plan:   Post-operative Plan:   Informed Consent: I have reviewed the patients History and Physical, chart, labs and discussed the procedure including the risks, benefits and alternatives for the proposed anesthesia with the patient or authorized representative who has indicated his/her understanding and acceptance.   Dental Advisory Given  Plan Discussed with: Anesthesiologist, CRNA and Surgeon  Anesthesia Plan Comments: (Patient consented for risks of anesthesia including but not limited to:  - adverse reactions to medications - risk of intubation if required - damage to teeth, lips or other oral mucosa - sore throat or hoarseness - Damage to heart, brain, lungs or loss of life  Patient voiced understanding.)        Anesthesia Quick Evaluation

## 2018-01-25 NOTE — Plan of Care (Signed)
  Progressing Education: Knowledge of General Education information will improve 01/25/2018 0625 - Progressing by Rolland Bimler, [place] Behavior/Discharge Planning: Ability to manage health-related needs will improve 01/25/2018 0625 - Progressing by Rolland Bimler, RN Clinical Measurements: Ability to maintain clinical measurements within normal limits will improve 01/25/2018 0625 - Progressing by Rolland Bimler, RN Will remain free from infection 01/25/2018 0625 - Progressing by Rolland Bimler, RN Diagnostic test results will improve 01/25/2018 0625 - Progressing by Rolland Bimler, RN Respiratory complications will improve 01/25/2018 0625 - Progressing by Rolland Bimler, RN Cardiovascular complication will be avoided 01/25/2018 0625 - Progressing by Rolland Bimler, RN Activity: Risk for activity intolerance will decrease 01/25/2018 0625 - Progressing by Rolland Bimler, RN Nutrition: Adequate nutrition will be maintained 01/25/2018 0625 - Progressing by Rolland Bimler, RN Coping: Level of anxiety will decrease 01/25/2018 0625 - Progressing by Rolland Bimler, RN Elimination: Will not experience complications related to bowel motility 01/25/2018 0625 - Progressing by Rolland Bimler, RN Will not experience complications related to urinary retention 01/25/2018 0625 - Progressing by Rolland Bimler, RN Pain Managment: General experience of comfort will improve 01/25/2018 0625 - Progressing by Rolland Bimler, RN Safety: Ability to remain free from injury will improve 01/25/2018 0625 - Progressing by Rolland Bimler, RN Skin Integrity: Risk for impaired skin integrity will decrease 01/25/2018 0625 - Progressing by Rolland Bimler, RN Bowel/Gastric: Will show no signs and symptoms of gastrointestinal bleeding 01/25/2018 0625 - Progressing by Rolland Bimler, RN Fluid  Volume: Will show no signs and symptoms of excessive bleeding 01/25/2018 5498 - Progressing by Rolland Bimler, RN Clinical Measurements: Complications related to the disease process, condition or treatment will be avoided or minimized 01/25/2018 0625 - Progressing by Rolland Bimler, RN

## 2018-01-25 NOTE — Transfer of Care (Signed)
Immediate Anesthesia Transfer of Care Note  Patient: Monica [name]  Procedure(s) Performed: COLONOSCOPY WITH PROPOFOL (N/A )  Patient Location: PACU and Endoscopy Unit  Anesthesia Type:General  Level of Consciousness: sedated  Airway & Oxygen Therapy: Patient Spontanous Breathing  Post-op Assessment: Report given to RN  Post vital signs: stable  Last Vitals:  Vitals:   01/25/18 0800 01/25/18 1000  BP: (!) 130/103 (!) 108/41  Pulse: 86 77  Resp: 16 19  Temp: (!) 36.4 C (!) 36.1 C  SpO2: 96% 100%    Last Pain:  Vitals:   01/25/18 1000  TempSrc: Tympanic         Complications: No apparent anesthesia complications

## 2018-01-26 ENCOUNTER — Encounter: Payer: Self-pay | Admitting: Gastroenterology

## 2018-01-26 LAB — HEMOGLOBIN: Hemoglobin: 9.2 g/dL — ABNORMAL LOW (ref 12.0–16.0)

## 2018-01-26 NOTE — Discharge Summary (Signed)
[facility] - [place] at [facility], 84 y.o., DOB 1926/10/15, MRN [id number]. Admission date: 01/23/2018 Discharge Date 01/26/2018 Primary MD [name], NP Admitting Physician Dustin Flock, MD  Admission Diagnosis  Gastrointestinal hemorrhage, unspecified gastrointestinal hemorrhage type [K92.2] Iron deficiency anemia, unspecified iron deficiency anemia type [D50.9]  Discharge Diagnosis   Active Problems: Acute GI bleed due to bleeding diverticuli Essential hypertension Diabetes type 2 Hyperlipidemia Atrial fibrillation was on chronic Coumadin COPD Diabetes Essential hypertension   Hospital Course Monica [name]  is a 84 y.o. female with a known history of atrial fibrillation, sarcoma, COPD, diabetes, essential hypertension and hyperlipidemia who is presenting with dark colored stool since Sunday.  Patient was admitted for further evaluation initially was thought to be a upper GI bleed however workup showed EGD to be negative.  Colonoscopy showed a bleeding diverticuli which was clipped.  Patient's hemoglobin remains stable post procedure.  She also was on Coumadin which has been discontinued.  Her primary care provider and her cardiologist need to discuss decide whether this will be safe to re-continue start.             Consults  gi  Significant Tests:  See full reports for all details    No results found.     Today   Subjective:   Monica [name] no further bleeding Objective:   Blood pressure (!) 110/47, pulse 95, temperature 98.6 F (37 C), temperature source Oral, resp. rate 18, height 5\' 4"  (1.626 m), weight 157 lb 12.8 oz (71.6 kg), SpO2 96 %.  .  Intake/Output Summary (Last 24 hours) at 01/26/2018 1838 Last data filed at 01/26/2018 1019 Gross per 24 hour  Intake 360 ml  Output -  Net 360 ml    Exam VITAL SIGNS: Blood pressure (!) 110/47, pulse 95, temperature 98.6 F (37 C), temperature source Oral, resp. rate 18,  height 5\' 4"  (1.626 m), weight 157 lb 12.8 oz (71.6 kg), SpO2 96 %.  GENERAL:  84 y.o.-year-old patient lying in the bed with no acute distress.  EYES: Pupils equal, round, reactive to light and accommodation. No scleral icterus. Extraocular muscles intact.  HEENT: Head atraumatic, normocephalic. Oropharynx and nasopharynx clear.  NECK:  Supple, no jugular venous distention. No thyroid enlargement, no tenderness.  LUNGS: Normal breath sounds bilaterally, no wheezing, rales,rhonchi or crepitation. No use of accessory muscles of respiration.  CARDIOVASCULAR: S1, S2 normal. No murmurs, rubs, or gallops.  ABDOMEN: Soft, nontender, nondistended. Bowel sounds present. No organomegaly or mass.  EXTREMITIES: No pedal edema, cyanosis, or clubbing.  NEUROLOGIC: Cranial nerves II through XII are intact. Muscle strength 5/5 in all extremities. Sensation intact. Gait not checked.  PSYCHIATRIC: The patient is alert and oriented x 3.  SKIN: No obvious rash, lesion, or ulcer.   Data Review     CBC w Diff:  Lab Results  Component Value Date   WBC 6.0 01/25/2018   HGB 9.2 (L) 01/26/2018   HGB 12.5 03/29/2014   HCT 24.2 (L) 01/25/2018   HCT 38.3 03/29/2014   PLT 257 01/25/2018   PLT 258 03/29/2014   LYMPHOPCT 10 12/03/2016   LYMPHOPCT 10.2 03/29/2014   MONOPCT 6 12/03/2016   MONOPCT 4.8 03/29/2014   EOSPCT 0 12/03/2016   EOSPCT 0.1 03/29/2014   BASOPCT 0 12/03/2016   BASOPCT 0.2 03/29/2014   CMP:  Lab Results  Component Value Date   NA 136 01/25/2018   NA 136 03/29/2014   K 4.2 01/25/2018  K 4.0 03/29/2014   CL 105 01/25/2018   CL 101 03/29/2014   CO2 23 01/25/2018   CO2 27 03/29/2014   BUN 11 01/25/2018   BUN 28 (H) 03/29/2014   CREATININE 0.63 01/25/2018   CREATININE 1.29 03/29/2014   PROT 6.3 (L) 01/23/2018   PROT 7.6 12/12/2013   ALBUMIN 3.7 01/23/2018   ALBUMIN 3.3 (L) 12/12/2013   BILITOT 0.5 01/23/2018   BILITOT 0.4 12/12/2013   ALKPHOS 67 01/23/2018   ALKPHOS 71  12/12/2013   AST 21 01/23/2018   AST 23 12/12/2013   ALT 15 01/23/2018   ALT 22 12/12/2013  .  Micro Results Recent Results (from the past 240 hour(s))  MRSA PCR Screening     Status: None   Collection Time: 01/23/18 10:46 PM  Result Value Ref Range Status   MRSA by PCR NEGATIVE NEGATIVE Final    Comment:        The GeneXpert MRSA Assay (FDA approved for NASAL specimens only), is one component of a comprehensive MRSA colonization surveillance program. It is not intended to diagnose MRSA infection nor to guide or monitor treatment for MRSA infections. Performed at [facility], [address] [place], [place] [address]      Code Status History    Date Active Date Inactive Code Status Order ID Comments User Context   01/23/2018 21:50 01/26/2018 13:45 Full Code [id number]  Dustin Flock, MD Inpatient   08/08/2016 12:00 08/11/2016 12:41 Full Code [id number]  Baxter Hire, MD Inpatient    Advance Directive Documentation     Most Recent Value  Type of Advance Directive  Healthcare Power of Attorney, Living will  Pre-existing out of facility DNR order (yellow form or pink MOST form)  No data  "MOST" Form in Place?  No data          Follow-up Information    [name], NP Follow up in 1 week(s).   Specialty:  Family Medicine Why:  hosp f/u check hemeglobin Contact information: [place] [address] [phone]           Discharge Medications   Allergies as of 01/26/2018      Reactions   Sulfa Antibiotics Swelling   Celecoxib Nausea And Vomiting   Acetaminophen Itching   Codeine Rash   Lyrica [pregabalin] Rash   Penicillin G Rash   Has patient had a PCN reaction causing immediate rash, facial/tongue/throat swelling, SOB or lightheadedness with hypotension: Yes Has patient had a PCN reaction causing severe rash involving mucus membranes or skin necrosis: No Has patient had a PCN reaction that required hospitalization:  No Has patient had a PCN reaction occurring within the last 10 years: Unknown If all of the above answers are "NO", then may proceed with Cephalosporin use.   Petrolatum-zinc Oxide Rash      Medication List    STOP taking these medications   levofloxacin 500 MG tablet Commonly known as:  LEVAQUIN   nitrofurantoin (macrocrystal-monohydrate) 100 MG capsule Commonly known as:  MACROBID   predniSONE 10 MG (48) Tbpk tablet Commonly known as:  STERAPRED UNI-PAK 48 TAB   warfarin 4 MG tablet Commonly known as:  COUMADIN     TAKE these medications   albuterol 108 (90 Base) MCG/ACT inhaler Commonly known as:  PROVENTIL HFA;VENTOLIN HFA Inhale 1 puff into the lungs every 4 (four) hours as needed for wheezing or shortness of breath.   amLODipine-benazepril 5-20 MG capsule Commonly known as:  LOTREL  TAKE 1 CAPSULE BY MOUTH EVERY DAY   ANORO ELLIPTA 62.5-25 MCG/INH Aepb Generic drug:  umeclidinium-vilanterol Inhale 1 puff into the lungs daily.   b complex vitamins capsule Take 1 capsule by mouth daily.   famotidine 20 MG tablet Commonly known as:  PEPCID Take 1 tablet (20 mg total) by mouth 2 (two) times daily.   fluticasone furoate-vilanterol 100-25 MCG/INH Aepb Commonly known as:  BREO ELLIPTA Inhale 1 puff into the lungs daily.   furosemide 20 MG tablet Commonly known as:  LASIX Take 20 mg by mouth daily.   gabapentin 300 MG capsule Commonly known as:  NEURONTIN Take 2 capsules (600 mg total) by mouth at bedtime.   glipiZIDE 2.5 MG 24 hr tablet Commonly known as:  GLUCOTROL XL TAKE ONE TABLET DAILY   glucose blood test strip 1 each by Other route as needed for other. Use as instructed   hydrochlorothiazide 25 MG tablet Commonly known as:  HYDRODIURIL Take 1 tablet (25 mg total) by mouth daily.   loratadine 10 MG tablet Commonly known as:  CLARITIN Take 10 mg by mouth daily.   metFORMIN 500 MG tablet Commonly known as:  GLUCOPHAGE TAKE ONE TABLET BY MOUTH  TWICE DAILY What changed:    how much to take  how to take this  when to take this   montelukast 10 MG tablet Commonly known as:  SINGULAIR Take 10 mg by mouth at bedtime.   oxybutynin 10 MG 24 hr tablet Commonly known as:  DITROPAN-XL Take 1 tablet (10 mg total) by mouth daily.   pravastatin 40 MG tablet Commonly known as:  PRAVACHOL Take 1 tablet (40 mg total) by mouth daily.   theophylline 100 MG 24 hr capsule Commonly known as:  THEO-24 Take 100 mg by mouth daily.          Total Time in preparing paper work, data evaluation and todays exam - 22 minutes  Dustin Flock M.D on 01/26/2018 at 6:38 PM [place]  [phone]

## 2018-01-26 NOTE — Care Management Important Message (Signed)
Important Message  Patient Details  Name: Monica [name] MRN: [id number] Date of Birth: 05-12-1926   Medicare Important Message Given:  Yes    Shelbie Ammons, RN 01/26/2018, 8:52 AM

## 2018-01-26 NOTE — Discharge Instructions (Signed)
[place] at [place]:  [place]:  Stable  ACTIVITY:  Activity as tolerated  OXYGEN:  Home Oxygen: No.   Oxygen Delivery: room air  DISCHARGE LOCATION:  home    ADDITIONAL DISCHARGE INSTRUCTION:   If you experience worsening of your admission symptoms, develop shortness of breath, life threatening emergency, suicidal or homicidal thoughts you must seek medical attention immediately by calling 911 or calling your MD immediately  if symptoms less severe.  You Must read complete instructions/literature along with all the possible adverse reactions/side effects for all the Medicines you take and that have been prescribed to you. Take any new Medicines after you have completely understood and accpet all the possible adverse reactions/side effects.   Please note  You were cared for by a hospitalist during your hospital stay. If you have any questions about your discharge medications or the care you received while you were in the hospital after you are discharged, you can call the unit and asked to speak with the hospitalist on call if the hospitalist that took care of you is not available. Once you are discharged, your primary care physician will handle any further medical issues. Please note that NO REFILLS for any discharge medications will be authorized once you are discharged, as it is imperative that you return to your primary care physician (or establish a relationship with a primary care physician if you do not have one) for your aftercare needs so that they can reassess your need for medications and monitor your lab values.

## 2018-01-26 NOTE — Progress Notes (Signed)
Patient discharged home per MD order. All discharge instructions given and all questions answered. 

## 2018-01-26 NOTE — Plan of Care (Signed)
  Education: Knowledge of General Education information will improve 01/26/2018 0254 - Progressing by Jeffie Pollock, RN   Health Behavior/Discharge Planning: Ability to manage health-related needs will improve 01/26/2018 0254 - Progressing by Jeffie Pollock, RN   Clinical Measurements: Ability to maintain clinical measurements within normal limits will improve 01/26/2018 0254 - Progressing by Jeffie Pollock, RN Will remain free from infection 01/26/2018 0254 - Progressing by Jeffie Pollock, RN Diagnostic test results will improve 01/26/2018 0254 - Progressing by Jeffie Pollock, RN Respiratory complications will improve 01/26/2018 0254 - Progressing by Jeffie Pollock, RN Cardiovascular complication will be avoided 01/26/2018 0254 - Progressing by Jeffie Pollock, RN   Activity: Risk for activity intolerance will decrease 01/26/2018 0254 - Progressing by Jeffie Pollock, RN   Nutrition: Adequate nutrition will be maintained 01/26/2018 0254 - Progressing by Jeffie Pollock, RN   Coping: Level of anxiety will decrease 01/26/2018 0254 - Progressing by Jeffie Pollock, RN   Elimination: Will not experience complications related to bowel motility 01/26/2018 0254 - Progressing by Jeffie Pollock, RN Will not experience complications related to urinary retention 01/26/2018 0254 - Progressing by Jeffie Pollock, RN   Pain Managment: General experience of comfort will improve 01/26/2018 0254 - Progressing by Jeffie Pollock, RN   Safety: Ability to remain free from injury will improve 01/26/2018 0254 - Progressing by Jeffie Pollock, RN   Skin Integrity: Risk for impaired skin integrity will decrease 01/26/2018 0254 - Progressing by Jeffie Pollock, RN   Bowel/Gastric: Will show no signs and symptoms of gastrointestinal bleeding 01/26/2018 0254 - Progressing by Jeffie Pollock, RN   Fluid Volume: Will show no signs and  symptoms of excessive bleeding 01/26/2018 0254 - Progressing by Jeffie Pollock, RN   Clinical Measurements: Complications related to the disease process, condition or treatment will be avoided or minimized 01/26/2018 0254 - Progressing by Jeffie Pollock, RN

## 2018-02-08 ENCOUNTER — Encounter: Payer: Self-pay | Admitting: Internal Medicine

## 2018-02-08 ENCOUNTER — Ambulatory Visit (INDEPENDENT_AMBULATORY_CARE_PROVIDER_SITE_OTHER): Payer: Medicare Other | Admitting: Internal Medicine

## 2018-02-08 VITALS — BP 130/74 | HR 86 | Resp 16 | Ht 63.0 in | Wt 150.0 lb

## 2018-02-08 DIAGNOSIS — I10 Essential (primary) hypertension: Secondary | ICD-10-CM | POA: Diagnosis not present

## 2018-02-08 DIAGNOSIS — K922 Gastrointestinal hemorrhage, unspecified: Secondary | ICD-10-CM | POA: Diagnosis not present

## 2018-02-08 DIAGNOSIS — C4922 Malignant neoplasm of connective and soft tissue of left lower limb, including hip: Secondary | ICD-10-CM | POA: Diagnosis not present

## 2018-02-08 DIAGNOSIS — I4891 Unspecified atrial fibrillation: Secondary | ICD-10-CM | POA: Diagnosis not present

## 2018-02-08 NOTE — Progress Notes (Signed)
[facility] [place], [place] [address]  Internal MEDICINE  Office Visit Note  Patient Name: Monica [name]  [id number]  [id number]  Date of Service: 02/08/2018     Chief Complaint  Patient presents with  . Follow-up    hospital follow up, done inr 1.0 yesterday , hospital hold coumadin until seen today   HPI Pt is here for recent hospital follow up. Pt has been on coumadin for over 10 years for atrial fib and DVT. She just had an episode of GI bleed. It was found be a lower GI bleed. 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 She will like to resume her care Daughter in the room with her   Current Medication: Outpatient Encounter Medications as of 02/08/2018  Medication Sig  . warfarin (COUMADIN) 4 MG tablet Take by mouth.  Marland Kitchen albuterol (PROVENTIL HFA;VENTOLIN HFA) 108 (90 Base) MCG/ACT inhaler Inhale 1 puff into the lungs every 4 (four) hours as needed for wheezing or shortness of breath.  Marland Kitchen amLODipine-benazepril (LOTREL) 5-20 MG capsule TAKE 1 CAPSULE BY MOUTH EVERY DAY  . b complex vitamins capsule Take 1 capsule by mouth daily.  . famotidine (PEPCID) 20 MG tablet Take 1 tablet (20 mg total) by mouth 2 (two) times daily. (Patient not taking: Reported on 01/23/2018)  . Fluticasone Furoate-Vilanterol 100-25 MCG/INH AEPB Inhale 1 puff into the lungs daily.   . furosemide (LASIX) 20 MG tablet Take 20 mg by mouth daily.   Marland Kitchen gabapentin (NEURONTIN) 300 MG capsule Take 2 capsules (600 mg total) by mouth at bedtime.  Marland Kitchen glipiZIDE (GLUCOTROL XL) 2.5 MG 24 hr tablet TAKE ONE TABLET DAILY  . glucose blood test strip 1 each by Other route as needed for other. Use as instructed  . hydrochlorothiazide (HYDRODIURIL) 25 MG tablet Take 1 tablet (25 mg total) by mouth daily.  Marland Kitchen loratadine (CLARITIN) 10 MG tablet Take 10 mg by mouth daily.  . metFORMIN (GLUCOPHAGE) 500 MG tablet TAKE  ONE TABLET BY MOUTH TWICE DAILY (Patient taking differently: take 750mg  daily)  . montelukast (SINGULAIR) 10 MG tablet Take 10 mg by mouth at bedtime.  Marland Kitchen oxybutynin (DITROPAN-XL) 10 MG 24 hr tablet Take 1 tablet (10 mg total) by mouth daily.  . pravastatin (PRAVACHOL) 40 MG tablet Take 1 tablet (40 mg total) by mouth daily.  . theophylline (THEO-24) 100 MG 24 hr capsule Take 100 mg by mouth daily.  Marland Kitchen Umeclidinium-Vilanterol (ANORO ELLIPTA) 62.5-25 MCG/INH AEPB Inhale 1 puff into the lungs daily.    No facility-administered encounter medications on file as of 02/08/2018.     Surgical History: Past Surgical History:  Procedure Laterality Date  . ABDOMINAL HYSTERECTOMY  1975  . BLADDER SUSPENSION  1989  . COLONOSCOPY    . COLONOSCOPY WITH PROPOFOL N/A 01/25/2018   Procedure: COLONOSCOPY WITH PROPOFOL;  Surgeon: [name], MD;  Location: [facility] ENDOSCOPY;  Service: Endoscopy;  Laterality: N/A;  . ESOPHAGOGASTRODUODENOSCOPY (EGD) WITH PROPOFOL N/A 01/24/2018   Procedure: ESOPHAGOGASTRODUODENOSCOPY (EGD) WITH PROPOFOL;  Surgeon: [name], MD;  Location: [facility] ENDOSCOPY;  Service: Endoscopy;  Laterality: N/A;    Medical History: Past Medical History:  Diagnosis Date  . Atrial fibrillation ([place])   . Bronchitis   . Cancer ([place])    Left leg growth, kidneys, lungs and breasts  . Carcinoma of unknown primary ([place])   . COPD (chronic obstructive pulmonary  disease) ([place])   . Diabetes mellitus without complication ([place])   . Hyperlipidemia   . Hypertension   . Pancreatitis   . Pneumonia   . Stroke ([place])    TIA's  . Vitamin B12 deficiency     Family History: Family History  Problem Relation Age of Onset  . Cancer Father   . Diabetes Brother     Social History   Socioeconomic History  . Marital status: Divorced    Spouse name: Not on file  . Number of children: Not on file  . Years of education: Not on file  . Highest education level: Not on file  Occupational History  . Not on  file  Social Needs  . Financial resource strain: Not on file  . Food insecurity:    Worry: Not on file    Inability: Not on file  . Transportation needs:    Medical: Not on file    Non-medical: Not on file  Tobacco Use  . Smoking status: Former Smoker    Packs/day: 0.50    Years: 15.00    Pack years: 7.50    Types: Cigarettes    Last attempt to quit: 11/15/1988    Years since quitting: 29.2  . Smokeless tobacco: Never Used  Substance and Sexual Activity  . Alcohol use: No    Alcohol/week: 0.0 oz  . Drug use: No  . Sexual activity: Not on file    Comment: hysterectomy   Lifestyle  . Physical activity:    Days per week: Not on file    Minutes per session: Not on file  . Stress: Not on file  Relationships  . Social connections:    Talks on phone: Not on file    Gets together: Not on file    Attends religious service: Not on file    Active member of club or organization: Not on file    Attends meetings of clubs or organizations: Not on file    Relationship status: Not on file  . Intimate partner violence:    Fear of current or ex partner: Not on file    Emotionally abused: Not on file    Physically abused: Not on file    Forced sexual activity: Not on file  Other Topics Concern  . Not on file  Social History Narrative  . Not on file      Review of Systems  Constitutional: Negative for chills, fatigue and unexpected weight change.  HENT: Positive for postnasal drip. Negative for congestion, rhinorrhea, sneezing and sore throat.   Eyes: Negative for redness.  Respiratory: Negative for cough, chest tightness and shortness of breath.   Cardiovascular: Negative for chest pain and palpitations.  Gastrointestinal: Negative for abdominal pain, constipation, diarrhea, nausea and vomiting.  Genitourinary: Negative for dysuria and frequency.  Musculoskeletal: Negative for arthralgias, back pain, joint swelling and neck pain.  Skin: Negative for rash.  Neurological:  Negative.  Negative for tremors and numbness.  Hematological: Negative for adenopathy. Does not bruise/bleed easily.  Psychiatric/Behavioral: Negative for behavioral problems (Depression), sleep disturbance and suicidal ideas. The patient is not nervous/anxious.     Vital Signs: BP 130/74 (BP Location: Left Arm, Patient Position: Sitting, Cuff Size: Normal)   Pulse 86   Resp 16   Ht 5\' 3"  (1.6 m)   Wt 150 lb (68 kg)   SpO2 96%   BMI 26.57 kg/m    Physical Exam  Constitutional: She is oriented to person, place, and time. She appears  well-developed and well-nourished.  Eyes: Conjunctivae and EOM are normal.  Neck: Normal range of motion. Neck supple.  Cardiovascular: Normal rate. An irregular rhythm present.  Pulmonary/Chest: Effort normal.  Abdominal: Soft.  Musculoskeletal: She exhibits tenderness and deformity.  Neurological: She is alert and oriented to person, place, and time.   Assessment/Plan: 1. Gastrointestinal hemorrhage, unspecified gastrointestinal hemorrhage type Continue per GI, Hold coumadin  2. Atrial fibrillation, unspecified type ([place]) Will hold off on coumadin for now, risks of GI bleed outweighs the benefit at this time, pt understands risks of CVA   3. 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[place]) -Consult with Oncology ?? Comfort care  4. Essential hypertension -controlled  General Counseling: Sharita verbalizes understanding of the findings of todays visit and agrees with plan of treatment. I have discussed any further diagnostic evaluation that may be needed or ordered today. We also reviewed her medications today. she has been encouraged to call the office with any questions or concerns that should arise related to todays visit.  I have reviewed all medical records from hospital follow up including radiology reports and consults from other physicians. Appropriate follow up diagnostics will be scheduled as needed. Patient/ Family understands the plan of treatment. Time spent  25 minutes.   Dr Lavera Guise, MD Internal Medicine

## 2018-02-12 DIAGNOSIS — I482 Chronic atrial fibrillation, unspecified: Secondary | ICD-10-CM | POA: Insufficient documentation

## 2018-02-12 DIAGNOSIS — I4891 Unspecified atrial fibrillation: Secondary | ICD-10-CM | POA: Insufficient documentation

## 2018-02-19 NOTE — Addendum Note (Signed)
Addendum  created 02/19/18 0848 by P[place]it[place], Precious Haws, MD   Attestation recorded in [place], [place] filed

## 2018-02-28 ENCOUNTER — Other Ambulatory Visit: Payer: Self-pay | Admitting: Nurse Practitioner

## 2018-02-28 MED ORDER — AMLODIPINE BESY-BENAZEPRIL HCL 5-20 MG PO CAPS: ORAL_CAPSULE | ORAL | 3 refills | Status: DC

## 2018-03-06 ENCOUNTER — Telehealth: Payer: Self-pay

## 2018-03-06 NOTE — Telephone Encounter (Signed)
Patient called complaining of breaking out in a rash and itching due to B-12 vitamin an also baby Aspirin, spoke with Dr. Clayborn Bigness and she advised that the patient stop taking the medications and restart taking them one at a time. I spoke with the patient and told her to stop taking both medications at the same time and to take one at a time per Dr. Clayborn Bigness, I also informed patient that she can use OTC vitamin E lotion and take benadryl for the itching, if nothing change she needs to call us back.

## 2018-03-13 ENCOUNTER — Ambulatory Visit (INDEPENDENT_AMBULATORY_CARE_PROVIDER_SITE_OTHER): Payer: Medicare Other | Admitting: Nurse Practitioner

## 2018-03-13 ENCOUNTER — Encounter: Payer: Self-pay | Admitting: Nurse Practitioner

## 2018-03-13 VITALS — BP 112/68 | HR 91 | Resp 16 | Ht 63.0 in | Wt 157.8 lb

## 2018-03-13 DIAGNOSIS — C4922 Malignant neoplasm of connective and soft tissue of left lower limb, including hip: Secondary | ICD-10-CM

## 2018-03-13 DIAGNOSIS — L209 Atopic dermatitis, unspecified: Secondary | ICD-10-CM | POA: Diagnosis not present

## 2018-03-13 DIAGNOSIS — E1165 Type 2 diabetes mellitus with hyperglycemia: Secondary | ICD-10-CM | POA: Diagnosis not present

## 2018-03-13 DIAGNOSIS — I4891 Unspecified atrial fibrillation: Secondary | ICD-10-CM

## 2018-03-13 MED ORDER — PREDNISONE 10 MG (21) PO TBPK: ORAL_TABLET | ORAL | 0 refills | Status: DC

## 2018-03-13 MED ORDER — TRIAMCINOLONE ACETONIDE 0.025 % EX CREA: 1.0000 "application " | TOPICAL_CREAM | Freq: Two times a day (BID) | CUTANEOUS | 2 refills | Status: DC

## 2018-03-13 NOTE — Progress Notes (Signed)
[facility] [place], [place] [address]  Internal MEDICINE  Office Visit Note  Patient Name: Monica [name]  [id number]  [id number]  Date of Service: 03/15/2018   Pt is here for a sick visit.  Chief Complaint  Patient presents with  . Rash    stopped taking baby aspirin and b12 ,started benadryl and lotion vitamin e     States that after her most recent hospitalization her warfarin was discontinued. She was started on baby aspirin and oral b12 supplement. Rash and worsenig skin lesions were noted after she started these new medications. She has since, stopped both of these. Rash continues to worsen. She is also not taking warfarin.   Rash  This is a new problem. The current episode started 1 to 4 weeks ago. The problem has been gradually worsening since onset. The rash is diffuse. The rash is characterized by itchiness, redness, scaling and burning. She was exposed to nothing. Associated symptoms include fatigue. Pertinent negatives include no congestion, cough, diarrhea, rhinorrhea, shortness of breath, sore throat or vomiting. Past treatments include anti-itch cream and topical steroids. The treatment provided no relief. Her past medical history is significant for allergies and asthma.        Current Medication:  Outpatient Encounter Medications as of 03/13/2018  Medication Sig  . albuterol (PROVENTIL HFA;VENTOLIN HFA) 108 (90 Base) MCG/ACT inhaler Inhale 1 puff into the lungs every 4 (four) hours as needed for wheezing or shortness of breath.  Marland Kitchen amLODipine-benazepril (LOTREL) 5-20 MG capsule TAKE 1 CAPSULE BY MOUTH EVERY DAY  . b complex vitamins capsule Take 1 capsule by mouth daily.  . famotidine (PEPCID) 20 MG tablet Take 1 tablet (20 mg total) by mouth 2 (two) times daily. (Patient not taking: Reported on 01/23/2018)  . Fluticasone Furoate-Vilanterol 100-25 MCG/INH AEPB Inhale 1 puff into the lungs daily.   . furosemide (LASIX) 20 MG tablet Take 20  mg by mouth daily.   Marland Kitchen gabapentin (NEURONTIN) 300 MG capsule Take 2 capsules (600 mg total) by mouth at bedtime.  Marland Kitchen glipiZIDE (GLUCOTROL XL) 2.5 MG 24 hr tablet TAKE ONE TABLET DAILY  . glucose blood test strip 1 each by Other route as needed for other. Use as instructed  . hydrochlorothiazide (HYDRODIURIL) 25 MG tablet Take 1 tablet (25 mg total) by mouth daily.  Marland Kitchen loratadine (CLARITIN) 10 MG tablet Take 10 mg by mouth daily.  . metFORMIN (GLUCOPHAGE) 500 MG tablet TAKE ONE TABLET BY MOUTH TWICE DAILY (Patient taking differently: take 750mg  daily)  . montelukast (SINGULAIR) 10 MG tablet Take 10 mg by mouth at bedtime.  Marland Kitchen oxybutynin (DITROPAN-XL) 10 MG 24 hr tablet Take 1 tablet (10 mg total) by mouth daily.  . pravastatin (PRAVACHOL) 40 MG tablet Take 1 tablet (40 mg total) by mouth daily.  . predniSONE (STERAPRED UNI-PAK 21 TAB) 10 MG (21) TBPK tablet 6 day taper - take by mouth as directed for 6 days  . theophylline (THEO-24) 100 MG 24 hr capsule Take 100 mg by mouth daily.  Marland Kitchen triamcinolone (KENALOG) 0.025 % cream Apply 1 application topically 2 (two) times daily.  Marland Kitchen Umeclidinium-Vilanterol (ANORO ELLIPTA) 62.5-25 MCG/INH AEPB Inhale 1 puff into the lungs daily.   Marland Kitchen warfarin (COUMADIN) 4 MG tablet Take by mouth.   No facility-administered encounter medications on file as of 03/13/2018.       Medical History: Past Medical History:  Diagnosis Date  . Atrial fibrillation ([place])   . Bronchitis   .  Cancer ([place])    Left leg growth, kidneys, lungs and breasts  . Carcinoma of unknown primary ([place])   . COPD (chronic obstructive pulmonary disease) ([place])   . Diabetes mellitus without complication ([place])   . Hyperlipidemia   . Hypertension   . Pancreatitis   . Pneumonia   . Stroke ([place])    TIA's  . Vitamin B12 deficiency      Today's Vitals   03/13/18 1612  BP: 112/68  Pulse: 91  Resp: 16  SpO2: 92%  Weight: 157 lb 12.8 oz (71.6 kg)  Height: 5\' 3"  (1.6 m)    Review of Systems   Constitutional: Positive for fatigue. Negative for activity change, chills and unexpected weight change.  HENT: Negative for congestion, postnasal drip, rhinorrhea, sneezing and sore throat.   Eyes: Negative.  Negative for redness.  Respiratory: Negative for cough, chest tightness and shortness of breath.   Cardiovascular: Negative for chest pain and palpitations.  Gastrointestinal: Negative for abdominal pain, constipation, diarrhea, nausea and vomiting.  Endocrine:       Blood sugars doing well   Genitourinary: Negative for dysuria and frequency.  Musculoskeletal: Negative for arthralgias, back pain, joint swelling and neck pain.  Skin: Positive for rash.       Multiple dark brown moles on the skin. Starting to get flaky and itchy. Seem to be getting bigger. Some of them have broken open and drain clear fluid .  Allergic/Immunologic: Positive for environmental allergies.  Neurological: Positive for weakness. Negative for tremors, numbness and headaches.  Hematological: Negative for adenopathy. Does not bruise/bleed easily.  Psychiatric/Behavioral: Negative for behavioral problems (Depression), sleep disturbance and suicidal ideas. The patient is not nervous/anxious.     Physical Exam  Constitutional: She is oriented to person, place, and time. She appears well-developed and well-nourished. No distress.  HENT:  Head: Normocephalic and atraumatic.  Mouth/Throat: Oropharynx is clear and moist. No oropharyngeal exudate.  Eyes: Pupils are equal, round, and reactive to light. EOM are normal.  Neck: Normal range of motion. Neck supple. No JVD present. No tracheal deviation present. No thyromegaly present.  Cardiovascular: Normal rate. Exam reveals no gallop and no friction rub.  Murmur heard. Irregular heart rhythm.   Pulmonary/Chest: Effort normal and breath sounds normal. No respiratory distress. She has no wheezes. She has no rales. She exhibits no tenderness.  Abdominal: Soft. Bowel  sounds are normal. There is no tenderness.  Musculoskeletal: Normal range of motion.  Lymphadenopathy:    She has no cervical adenopathy.  Neurological: She is alert and oriented to person, place, and time. No cranial nerve deficit.  Skin: Skin is warm and dry. Rash noted. She is not diaphoretic.  Diffuse, red, scaly rash. Many larger, brown, round lesions on the arms, and especially on the back. These are also itchy and irritated. Areas where paitient has scratched so much, there are scabs and clear drainage.   Psychiatric: She has a normal mood and affect. Her behavior is normal. Judgment and thought content normal.  Nursing note and vitals reviewed.  Assessment/Plan:  1. Atopic dermatitis, unspecified type Appears to be inflammatory in nature.  - predniSONE (STERAPRED UNI-PAK 21 TAB) 10 MG (21) TBPK tablet; 6 day taper - take by mouth as directed for 6 days  Dispense: 21 tablet; Refill: 0 - triamcinolone (KENALOG) 0.025 % cream; Apply 1 application topically 2 (two) times daily.  Dispense: 80 g; Refill: 2  2. Sarcoma of left thigh [facility]) Patient is concerned new rash and enlarging  moles may be related to previously diagnosed sarcoma. Would like to see oncology for opinion. Will treat and refer as recommended.  - Ambulatory referral to Oncology  3. Atrial fibrillation, unspecified type [facility]) Patient wishes to restart warfarin and stop aspirin and b12. After long discussion about options, will restart warfarin at 2mg  every day. Will check INR at her next visit.   4. Uncontrolled type 2 diabetes mellitus with hyperglycemia ([place]) Continue diabetic medications as prescribed.    General Counseling: Tanelle verbalizes understanding of the findings of todays visit and agrees with plan of treatment. I have discussed any further diagnostic evaluation that may be needed or ordered today. We also reviewed her medications today. she has been encouraged to call the office with any questions or concerns  that should arise related to todays visit.  This patient was seen by Leretha Pol, FNP- C in Collaboration with Dr Lavera Guise as a part of collaborative care agreement    Orders Placed This Encounter  Procedures  . Ambulatory referral to Oncology    Meds ordered this encounter  Medications  . predniSONE (STERAPRED UNI-PAK 21 TAB) 10 MG (21) TBPK tablet    Sig: 6 day taper - take by mouth as directed for 6 days    Dispense:  21 tablet    Refill:  0    Order Specific Question:   Supervising Provider    Answer:   Lavera Guise [place]  . triamcinolone (KENALOG) 0.025 % cream    Sig: Apply 1 application topically 2 (two) times daily.    Dispense:  80 g    Refill:  2    Order Specific Question:   Supervising Provider    Answer:   Lavera Guise [[id number]]    Time spent:25 Minutes

## 2018-03-15 DIAGNOSIS — L209 Atopic dermatitis, unspecified: Secondary | ICD-10-CM | POA: Insufficient documentation

## 2018-03-15 DIAGNOSIS — C4922 Malignant neoplasm of connective and soft tissue of left lower limb, including hip: Secondary | ICD-10-CM | POA: Insufficient documentation

## 2018-03-20 ENCOUNTER — Ambulatory Visit: Payer: Medicare Other | Admitting: Oncology

## 2018-03-20 NOTE — Progress Notes (Signed)
[place]  [phone]) [phone] Fax:(336) [phone]  ID: Monica [name] OB: 10-Nov-1926  MR#: [id number]  YBW#:[id number]  Patient Care Team: [name], NP as PCP - General (Family Medicine) Christene Lye, MD (General Surgery) [name], NP as Nurse Practitioner (Family Medicine) Lavera Guise, MD (Internal Medicine)  CHIEF COMPLAINT: Sarcoma of left thigh.  INTERVAL HISTORY: Patient was last evaluated in clinic by another provider in January 2018.  She recently had a rash that concerned her for recurrence of her sarcoma, but it has since resolved with topical treatments given to her by her primary care physician.  She has multiple medical complaints today.  She has no neurologic complaints.  She denies any new pain.  She has a [name] appetite and denies weight loss.  She has no chest pain or shortness of breath.  She denies any nausea, vomiting, constipation, or diarrhea.  She has no urinary complaints.  Patient otherwise feels well and offers no further specific complaints.  REVIEW OF SYSTEMS:   Review of Systems  Constitutional: Negative.  Negative for fever, malaise/fatigue and weight loss.  Respiratory: Negative.  Negative for cough and shortness of breath.   Cardiovascular: Positive for chest pain. Negative for leg swelling.  Gastrointestinal: Negative.  Negative for abdominal pain, constipation, diarrhea, nausea and vomiting.  Genitourinary: Negative.  Negative for dysuria.  Musculoskeletal: Positive for back pain, joint pain and myalgias.  Skin: Positive for rash.  Neurological: Negative for tingling, sensory change, focal weakness and weakness.  Psychiatric/Behavioral: Negative.  The patient is not nervous/anxious.     As per HPI. Otherwise, a complete review of systems is negative.  PAST MEDICAL HISTORY: Past Medical History:  Diagnosis Date  . Atrial fibrillation ([place])   . Bronchitis   . Cancer ([place])    Left leg growth,  kidneys, lungs and breasts  . Carcinoma of unknown primary ([place])   . COPD (chronic obstructive pulmonary disease) ([place])   . Diabetes mellitus without complication ([place])   . Hyperlipidemia   . Hypertension   . Pancreatitis   . Pneumonia   . Stroke ([place])    TIA's  . Vitamin B12 deficiency     PAST SURGICAL HISTORY: Past Surgical History:  Procedure Laterality Date  . ABDOMINAL HYSTERECTOMY  1975  . BLADDER SUSPENSION  1989  . COLONOSCOPY    . COLONOSCOPY WITH PROPOFOL N/A 01/25/2018   Procedure: COLONOSCOPY WITH PROPOFOL;  Surgeon: [name], MD;  Location[facility] END[facility]COPY;  Service: Endoscopy;  Laterality: N/A;  . ESOPHAGOGASTRODUODENOSCOPY (EGD) WITH PROPOFOL N/A 01/24/2018   Procedure: ESOPHAGOGASTRODUODENOSCOPY (EGD) WITH PROPOFOL;  Surgeon: [name], MD;  Location: [facility] ENDOSCOPY;  Service: Endoscopy;  Laterality: N/A;    FAMILY HISTORY: Family History  Problem Relation Age of Onset  . Cancer Father   . Diabetes Brother     ADVANCED DIRECTIVES (Y/N):  N  HEALTH MAINTENANCE: Social History   Tobacco Use  . Smoking status: Former Smoker    Packs/day: 0.50    Years: 15.00    Pack years: 7.50    Types: Cigarettes    Last attempt to quit: 11/15/1988    Years since quitting: 29.3  . Smokeless tobacco: Never Used  Substance Use Topics  . Alcohol use: No    Alcohol/week: 0.0 oz  . Drug use: No     Colonoscopy:  PAP:  Bone density:  Lipid panel:  Allergies  Allergen Reactions  . Sulfa Antibiotics Swelling  . Celecoxib Nausea And Vomiting  .  Acetaminophen Itching  . Codeine Rash  . Lyrica [Pregabalin] Rash  . Penicillin G Rash    Has patient had a PCN reaction causing immediate rash, facial/tongue/throat swelling, SOB or lightheadedness with hypotension: Yes Has patient had a PCN reaction causing severe rash involving mucus membranes or skin necrosis: No Has patient had a PCN reaction that required hospitalization: No Has patient had a PCN reaction  occurring within the last 10 years: Unknown If all of the above answers are "NO", then may proceed with Cephalosporin use.  Marland Kitchen Petrolatum-Zinc Oxide Rash    Current Outpatient Medications  Medication Sig Dispense Refill  . albuterol (PROVENTIL HFA;VENTOLIN HFA) 108 (90 Base) MCG/ACT inhaler Inhale 1 puff into the lungs every 4 (four) hours as needed for wheezing or shortness of breath.    Marland Kitchen amLODipine-benazepril (LOTREL) 5-20 MG capsule TAKE 1 CAPSULE BY MOUTH EVERY DAY 90 capsule 3  . b complex vitamins capsule Take 1 capsule by mouth daily.    . famotidine (PEPCID) 20 MG tablet Take 1 tablet (20 mg total) by mouth 2 (two) times daily. 30 tablet 0  . furosemide (LASIX) 20 MG tablet Take 20 mg by mouth daily.     Marland Kitchen gabapentin (NEURONTIN) 300 MG capsule Take 2 capsules (600 mg total) by mouth at bedtime. 60 capsule 5  . glipiZIDE (GLUCOTROL XL) 2.5 MG 24 hr tablet TAKE ONE TABLET DAILY 90 tablet 0  . glucose blood test strip 1 each by Other route as needed for other. Use as instructed 100 each 1  . hydrochlorothiazide (HYDRODIURIL) 25 MG tablet Take 1 tablet (25 mg total) by mouth daily. 30 tablet 5  . loratadine (CLARITIN) 10 MG tablet Take 10 mg by mouth daily.    . metFORMIN (GLUCOPHAGE) 500 MG tablet TAKE ONE TABLET BY MOUTH TWICE DAILY (Patient taking differently: take 750mg  daily) 180 tablet 0  . montelukast (SINGULAIR) 10 MG tablet Take 10 mg by mouth at bedtime.    Marland Kitchen oxybutynin (DITROPAN-XL) 10 MG 24 hr tablet Take 1 tablet (10 mg total) by mouth daily. 30 tablet 5  . pravastatin (PRAVACHOL) 40 MG tablet Take 1 tablet (40 mg total) by mouth daily. 90 tablet 1  . theophylline (THEO-24) 100 MG 24 hr capsule Take 100 mg by mouth daily.    Marland Kitchen triamcinolone (KENALOG) 0.025 % cream Apply 1 application topically 2 (two) times daily. 80 g 2  . Umeclidinium-Vilanterol (ANORO ELLIPTA) 62.5-25 MCG/INH AEPB Inhale 1 puff into the lungs daily.     Marland Kitchen warfarin (COUMADIN) 4 MG tablet Take by mouth.       Marland Kitchen BREO ELLIPTA 100-25 MCG/INH AEPB USE 1 INHALATION ONCE DAILY 60 each 1   No current facility-administered medications for this visit.     OBJECTIVE: Vitals:   03/22/18 1455  BP: 139/67  Pulse: 85  Resp: 20  Temp: (!) 96.4 F (35.8 C)     Body mass index is 27.74 kg/m.    ECOG FS:1 - Symptomatic but completely ambulatory  General: Well-developed, well-nourished, no acute distress. Eyes: Pink conjunctiva, anicteric sclera. HEENT: Normocephalic, moist mucous membranes, clear oropharnyx. Lungs: Clear to auscultation bilaterally. Heart: Regular rate and rhythm. No rubs, murmurs, or gallops. Abdomen: Soft, nontender, nondistended. No organomegaly noted, normoactive bowel sounds. Musculoskeletal: No edema, cyanosis, or clubbing. Neuro: Alert, answering all questions appropriately. Cranial nerves grossly intact. Skin: No rashes or petechiae noted. Psych: Normal affect. Lymphatics: No cervical, calvicular, axillary or inguinal LAD.   LAB RESULTS:  Lab Results  Component Value Date   NA 136 01/25/2018   K 4.2 01/25/2018   CL 105 01/25/2018   CO2 23 01/25/2018   GLUCOSE 136 (H) 01/25/2018   BUN 11 01/25/2018   CREATININE 0.63 01/25/2018   CALCIUM 8.4 (L) 01/25/2018   PROT 6.3 (L) 01/23/2018   ALBUMIN 3.7 01/23/2018   AST 21 01/23/2018   ALT 15 01/23/2018   ALKPHOS 67 01/23/2018   BILITOT 0.5 01/23/2018   GFRNONAA >60 01/25/2018   GFRAA >60 01/25/2018    Lab Results  Component Value Date   WBC 6.0 01/25/2018   NEUTROABS 13.8 (H) 12/03/2016   HGB 9.2 (L) 01/26/2018   HCT 24.2 (L) 01/25/2018   MCV 85.9 01/25/2018   PLT 257 01/25/2018     STUDIES: No results found.  ASSESSMENT: Sarcoma of left thigh  PLAN:    1. Sarcoma of left thigh: Patient noted to have metastatic disease with left pelvic lymphadenopathy.  She received XRT to her left thigh and received 3 doses of Keytruda most recently in December 2017.  Patient elected to discontinue all treatments at  that time.  Hospice was discussed, but never pursued.  Given patient's multiple medical complaints, it is reasonable to pursue reimaging with PET scan to assess the extent of her disease.  Patient did acknowledge that given her advanced age she likely would not tolerate chemotherapy and has adamantly refused reinitiating immunotherapy.  Return to clinic in 1 week after the PET scan to discuss the results.  Approximately 60 minutes was spent in discussion and chart review of which greater than 50% was consultation.  Patient expressed understanding and was in agreement with this plan. She also understands that She can call clinic at any time with any questions, concerns, or complaints.   Cancer Staging No matching staging information was found for the patient.  Lloyd Huger, MD   03/26/2018 3:54 PM

## 2018-03-22 ENCOUNTER — Other Ambulatory Visit: Payer: Self-pay

## 2018-03-22 ENCOUNTER — Encounter: Payer: Self-pay | Admitting: Oncology

## 2018-03-22 ENCOUNTER — Inpatient Hospital Stay: Payer: Medicare Other | Attending: Oncology | Admitting: Oncology

## 2018-03-22 VITALS — BP 139/67 | HR 85 | Temp 96.4°F | Resp 20 | Wt 156.6 lb

## 2018-03-22 DIAGNOSIS — L299 Pruritus, unspecified: Secondary | ICD-10-CM | POA: Insufficient documentation

## 2018-03-22 DIAGNOSIS — C7989 Secondary malignant neoplasm of other specified sites: Secondary | ICD-10-CM | POA: Insufficient documentation

## 2018-03-22 DIAGNOSIS — C4922 Malignant neoplasm of connective and soft tissue of left lower limb, including hip: Secondary | ICD-10-CM | POA: Insufficient documentation

## 2018-03-22 DIAGNOSIS — Z87891 Personal history of nicotine dependence: Secondary | ICD-10-CM | POA: Diagnosis not present

## 2018-03-22 DIAGNOSIS — C7652 Malignant neoplasm of left lower limb: Secondary | ICD-10-CM | POA: Diagnosis not present

## 2018-03-22 NOTE — Progress Notes (Signed)
Pt here for follow up. See pain assessment. Here w daughters.

## 2018-03-23 ENCOUNTER — Other Ambulatory Visit: Payer: Self-pay | Admitting: Internal Medicine

## 2018-03-27 ENCOUNTER — Ambulatory Visit (INDEPENDENT_AMBULATORY_CARE_PROVIDER_SITE_OTHER): Payer: Medicare Other | Admitting: Nurse Practitioner

## 2018-03-27 ENCOUNTER — Encounter: Payer: Self-pay | Admitting: Nurse Practitioner

## 2018-03-27 ENCOUNTER — Telehealth: Payer: Self-pay | Admitting: *Deleted

## 2018-03-27 VITALS — BP 130/55 | HR 96 | Resp 16 | Ht 63.0 in | Wt 157.0 lb

## 2018-03-27 DIAGNOSIS — L239 Allergic contact dermatitis, unspecified cause: Secondary | ICD-10-CM

## 2018-03-27 DIAGNOSIS — E1165 Type 2 diabetes mellitus with hyperglycemia: Secondary | ICD-10-CM

## 2018-03-27 DIAGNOSIS — I4891 Unspecified atrial fibrillation: Secondary | ICD-10-CM

## 2018-03-27 DIAGNOSIS — I10 Essential (primary) hypertension: Secondary | ICD-10-CM

## 2018-03-27 DIAGNOSIS — L209 Atopic dermatitis, unspecified: Secondary | ICD-10-CM | POA: Diagnosis not present

## 2018-03-27 LAB — POCT INR

## 2018-03-27 MED ORDER — PREDNISONE 10 MG (48) PO TBPK: ORAL_TABLET | ORAL | 0 refills | Status: DC

## 2018-03-27 MED ORDER — WARFARIN SODIUM 4 MG PO TABS: 4.0000 mg | ORAL_TABLET | Freq: Every day | ORAL | 5 refills | Status: DC

## 2018-03-27 MED ORDER — DESLORATADINE 5 MG PO TABS: 5.0000 mg | ORAL_TABLET | Freq: Every day | ORAL | 3 refills | Status: DC

## 2018-03-27 NOTE — Progress Notes (Signed)
[facility] [place], [place] [address]  Internal MEDICINE  Office Visit Note  Patient Name: Monica [name]  [id number]  [id number]  Date of Service: 04/19/2018    Pt is here for routine follow up.   Chief Complaint  Patient presents with  . Rash    This is a new problem. The current episode started 1 to 4 weeks ago. The problem has been gradually worsening since onset. The rash is diffuse. The rash is characterized by itchiness, redness, scaling and burning. She was exposed to nothing. Associated symptoms include fatigue. Pertinent negatives include no congestion, cough, diarrhea, rhinorrhea, shortness of breath, sore throat or vomiting. Past treatments include anti-itch cream and topical steroids. At last visit, she was prescribed prednisone taper, which did help. b12 and aspirin were discontinued and she was restarted on warfarin. Itching and rash have improved some. Started to worsen again after prednisone taper was complete.       Current Medication: Outpatient Encounter Medications as of 03/27/2018  Medication Sig Note  . albuterol (PROVENTIL HFA;VENTOLIN HFA) 108 (90 Base) MCG/ACT inhaler Inhale 1 puff into the lungs every 4 (four) hours as needed for wheezing or shortness of breath.   Marland Kitchen amLODipine-benazepril (LOTREL) 5-20 MG capsule TAKE 1 CAPSULE BY MOUTH EVERY DAY   . b complex vitamins capsule Take 1 capsule by mouth daily.   Marland Kitchen BREO ELLIPTA 100-25 MCG/INH AEPB USE 1 INHALATION ONCE DAILY   . desloratadine (CLARINEX) 5 MG tablet Take 1 tablet (5 mg total) by mouth daily.   . famotidine (PEPCID) 20 MG tablet Take 1 tablet (20 mg total) by mouth 2 (two) times daily. (Patient not taking: Reported on 03/30/2018)   . furosemide (LASIX) 20 MG tablet Take 20 mg by mouth daily.    Marland Kitchen gabapentin (NEURONTIN) 300 MG capsule Take 2 capsules (600 mg total) by mouth at bedtime.   Marland Kitchen glipiZIDE (GLUCOTROL XL) 2.5 MG 24 hr tablet TAKE ONE TABLET DAILY   . glucose blood  test strip 1 each by Other route as needed for other. Use as instructed (Patient not taking: Reported on 03/30/2018)   . hydrochlorothiazide (HYDRODIURIL) 25 MG tablet Take 1 tablet (25 mg total) by mouth daily.   . metFORMIN (GLUCOPHAGE) 500 MG tablet TAKE ONE TABLET BY MOUTH TWICE DAILY (Patient taking differently: take 750mg  daily)   . montelukast (SINGULAIR) 10 MG tablet Take 10 mg by mouth at bedtime.   Marland Kitchen oxybutynin (DITROPAN-XL) 10 MG 24 hr tablet Take 1 tablet (10 mg total) by mouth daily.   . pravastatin (PRAVACHOL) 40 MG tablet Take 1 tablet (40 mg total) by mouth daily.   . predniSONE (STERAPRED UNI-PAK 48 TAB) 10 MG (48) TBPK tablet 12 day taper - take by mouth as directed for 12 days   . theophylline (THEO-24) 100 MG 24 hr capsule Take 100 mg by mouth daily.   Marland Kitchen triamcinolone (KENALOG) 0.025 % cream Apply 1 application topically 2 (two) times daily.   Marland Kitchen Umeclidinium-Vilanterol (ANORO ELLIPTA) 62.5-25 MCG/INH AEPB Inhale 1 puff into the lungs daily.    Marland Kitchen warfarin (COUMADIN) 4 MG tablet Take 1 tablet (4 mg total) by mouth daily at 6 PM. Take 1 tablet every tue, wed, thur, sat, and sun. Take 1/2 tablet very Monday and friday   . [DISCONTINUED] loratadine (CLARITIN) 10 MG tablet Take 10 mg by mouth daily. 03/27/2018: start desloratidine  . [DISCONTINUED] warfarin (COUMADIN) 4 MG tablet Take by mouth.     No  facility-administered encounter medications on file as of 03/27/2018.     Surgical History: Past Surgical History:  Procedure Laterality Date  . ABDOMINAL HYSTERECTOMY  1975  . BLADDER SUSPENSION  1989  . COLONOSCOPY    . COLONOSCOPY WITH PROPOFOL N/A 01/25/2018   Procedure: COLONOSCOPY WITH PROPOFOL;  Surgeon: [name], MD;  Location: [facility] ENDOSCOPY;  Service: Endoscopy;  Laterality: N/A;  . ESOPHAGOGASTRODUODENOSCOPY (EGD) WITH PROPOFOL N/A 01/24/2018   Procedure: ESOPHAGOGASTRODUODENOSCOPY (EGD) WITH PROPOFOL;  Surgeon: [name], MD;  Location: [facility] ENDOSCOPY;  Service:  Endoscopy;  Laterality: N/A;    Medical History: Past Medical History:  Diagnosis Date  . Atrial fibrillation ([place])   . Bronchitis   . Cancer ([place])    Left leg growth, kidneys, lungs and breasts  . Carcinoma of unknown primary ([place])   . COPD (chronic obstructive pulmonary disease) ([place])   . Diabetes mellitus without complication ([place])   . Hyperlipidemia   . Hypertension   . Pancreatitis   . Pneumonia   . Stroke ([place])    TIA's  . Vitamin B12 deficiency     Family History: Family History  Problem Relation Age of Onset  . Cancer Father   . Diabetes Brother     Social History   Socioeconomic History  . Marital status: Divorced    Spouse name: Not on file  . Number of children: Not on file  . Years of education: Not on file  . Highest education level: Not on file  Occupational History  . Not on file  Social Needs  . Financial resource strain: Not on file  . Food insecurity:    Worry: Not on file    Inability: Not on file  . Transportation needs:    Medical: Not on file    Non-medical: Not on file  Tobacco Use  . Smoking status: Former Smoker    Packs/day: 0.50    Years: 15.00    Pack years: 7.50    Types: Cigarettes    Last attempt to quit: 11/15/1988    Years since quitting: 29.4  . Smokeless tobacco: Never Used  Substance and Sexual Activity  . Alcohol use: No    Alcohol/week: 0.0 oz  . Drug use: No  . Sexual activity: Not on file    Comment: hysterectomy   Lifestyle  . Physical activity:    Days per week: Not on file    Minutes per session: Not on file  . Stress: Not on file  Relationships  . Social connections:    Talks on phone: Not on file    Gets together: Not on file    Attends religious service: Not on file    Active member of club or organization: Not on file    Attends meetings of clubs or organizations: Not on file    Relationship status: Not on file  . Intimate partner violence:    Fear of current or ex partner: Not on file     Emotionally abused: Not on file    Physically abused: Not on file    Forced sexual activity: Not on file  Other Topics Concern  . Not on file  Social History Narrative  . Not on file      Review of Systems  Constitutional: Positive for fatigue. Negative for activity change, chills and unexpected weight change.  HENT: Negative for congestion, postnasal drip, rhinorrhea, sneezing and sore throat.   Eyes: Negative.  Negative for redness.  Respiratory: Negative for cough, chest tightness  and shortness of breath.   Cardiovascular: Negative for chest pain and palpitations.  Gastrointestinal: Negative for abdominal pain, constipation, diarrhea, nausea and vomiting.  Endocrine:       Blood sugars doing well   Genitourinary: Negative for dysuria and frequency.  Musculoskeletal: Negative for arthralgias, back pain, joint swelling and neck pain.  Skin: Positive for rash.       Multiple dark brown moles on the skin. Starting to get flaky and itchy. Seem to be getting bigger. Some of them have broken open and drain clear fluid .  Allergic/Immunologic: Positive for environmental allergies.  Neurological: Positive for weakness. Negative for tremors, numbness and headaches.  Hematological: Negative for adenopathy. Does not bruise/bleed easily.  Psychiatric/Behavioral: Negative for behavioral problems (Depression), sleep disturbance and suicidal ideas. The patient is not nervous/anxious.     Today's Vitals   03/27/18 1432  BP: (!) 130/55  Pulse: 96  Resp: 16  SpO2: 94%  Weight: 157 lb (71.2 kg)  Height: 5\' 3"  (1.6 m)    Physical Exam  Constitutional: She is oriented to person, place, and time. She appears well-developed and well-nourished. No distress.  HENT:  Head: Normocephalic and atraumatic.  Mouth/Throat: Oropharynx is clear and moist. No oropharyngeal exudate.  Eyes: Pupils are equal, round, and reactive to light. EOM are normal.  Neck: Normal range of motion. Neck supple. No JVD  present. No tracheal deviation present. No thyromegaly present.  Cardiovascular: Normal rate. Exam reveals no gallop and no friction rub.  Murmur heard. Irregular heart rhythm.   Pulmonary/Chest: Effort normal and breath sounds normal. No respiratory distress. She has no wheezes. She has no rales. She exhibits no tenderness.  Abdominal: Soft. Bowel sounds are normal. There is no tenderness.  Musculoskeletal: Normal range of motion.  Lymphadenopathy:    She has no cervical adenopathy.  Neurological: She is alert and oriented to person, place, and time. No cranial nerve deficit.  Skin: Skin is warm and dry. Rash noted. She is not diaphoretic.  Diffuse, red, scaly rash. Many larger, brown, round lesions on the arms, and especially on the back. These are also itchy and irritated. Areas where paitient has scratched so much, there are scabs and clear drainage.   Psychiatric: She has a normal mood and affect. Her behavior is normal. Judgment and thought content normal.  Nursing note and vitals reviewed.  Assessment/Plan: 1. Atopic dermatitis, unspecified type Looks to be allergic dermatitis. Will start clarinex 5mg  daily. Continue other allergy medications as prescribed. Add prednisone 5mg  dose pack - take as directed for 6 days.  - desloratadine (CLARINEX) 5 MG tablet; Take 1 tablet (5 mg total) by mouth daily.  Dispense: 30 tablet; Refill: 3 - predniSONE (STERAPRED UNI-PAK 48 TAB) 10 MG (48) TBPK tablet; 12 day taper - take by mouth as directed for 12 days  Dispense: 48 tablet; Refill: 0  2. Allergic contact dermatitis, unspecified trigger Add clarinex 5mg  daily and continue to take other allergy medications as prescribed. Use triamcinolone cream as presviously prescribed.  - desloratadine (CLARINEX) 5 MG tablet; Take 1 tablet (5 mg total) by mouth daily.  Dispense: 30 tablet; Refill: 3  3. Atrial fibrillation, unspecified type ([place]) - POCT INR 1.5 today. Adjust warfarin dosing with following  instructions.  - warfarin (COUMADIN) 4 MG tablet; Take 1 tablet (4 mg total) by mouth daily at 6 PM. Take 1 tablet every tue, wed, thur, sat, and sun. Take 1/2 tablet very Monday and friday  Dispense: 30 tablet; Refill: 5  4. Uncontrolled type 2 diabetes mellitus with hyperglycemia ([place]) Continue diabetic medication as prescribed. Monitor sugar closely while taking prednisone.   5. Essential hypertension Stable. Continue bp medication as prescribed  General Counseling: Alianna verbalizes understanding of the findings of todays visit and agrees with plan of treatment. I have discussed any further diagnostic evaluation that may be needed or ordered today. We also reviewed her medications today. she has been encouraged to call the office with any questions or concerns that should arise related to todays visit.    Counseling:  Diabetes Counseling:  1. Addition of ACE inh/ ARB'S for nephroprotection. 2. Diabetic foot care, prevention of complications.  3.Exercise and lose weight.  4. Diabetic eye examination, 5. Monitor blood sugar closlely. nutrition counseling.  6.Sign and symptoms of hypoglycemia including shaking sweating,confusion and headaches.   This patient was seen by Leretha Pol, FNP- C in Collaboration with Dr Lavera Guise as a part of collaborative care agreement  Orders Placed This Encounter  Procedures  . POCT INR    Meds ordered this encounter  Medications  . desloratadine (CLARINEX) 5 MG tablet    Sig: Take 1 tablet (5 mg total) by mouth daily.    Dispense:  30 tablet    Refill:  3    Please d/c loratidine    Order Specific Question:   Supervising Provider    Answer:   Lavera Guise [[id number]]  . predniSONE (STERAPRED UNI-PAK 48 TAB) 10 MG (48) TBPK tablet    Sig: 12 day taper - take by mouth as directed for 12 days    Dispense:  48 tablet    Refill:  0    Order Specific Question:   Supervising Provider    Answer:   Lavera Guise [place]  . warfarin (COUMADIN) 4  MG tablet    Sig: Take 1 tablet (4 mg total) by mouth daily at 6 PM. Take 1 tablet every tue, wed, thur, sat, and sun. Take 1/2 tablet very Monday and friday    Dispense:  30 tablet    Refill:  5    Order Specific Question:   Supervising Provider    Answer:   Lavera Guise [[id number]]    Time spent: 68 Minutes          Dr Lavera Guise Internal medicine

## 2018-03-27 NOTE — Progress Notes (Signed)
[place]  [phone] [phone]) [phone]  ID: Monica [name] OB: 07/17/1926  MR#: [id number]  KZS#:[id number]  Patient Care Team: [name], NP as PCP - General (Family Medicine) Christene Lye, MD (General Surgery) [name], NP as Nurse Practitioner (Family Medicine) Lavera Guise, MD (Internal Medicine)  CHIEF COMPLAINT: Sarcoma of left thigh.  INTERVAL HISTORY: Patient returns to clinic today for further evaluation and discussion of her PET scan results.  Her rash has now completely resolved, although she continues to complain complain of pruritus.  She continues to have multiple medical complaints. She has no neurologic complaints.  She denies any new pain.  She has a [name] appetite and denies weight loss.  She has no chest pain or shortness of breath.  She denies any nausea, vomiting, constipation, or diarrhea.  She has no urinary complaints.  Patient offers no further specific complaints today.  REVIEW OF SYSTEMS:   Review of Systems  Constitutional: Negative.  Negative for fever, malaise/fatigue and weight loss.  Respiratory: Negative.  Negative for cough and shortness of breath.   Cardiovascular: Positive for chest pain. Negative for leg swelling.  Gastrointestinal: Negative.  Negative for abdominal pain, constipation, diarrhea, nausea and vomiting.  Genitourinary: Negative.  Negative for dysuria.  Musculoskeletal: Positive for back pain, joint pain and myalgias.  Skin: Positive for itching. Negative for rash.  Neurological: Negative.  Negative for tingling, sensory change, focal weakness and weakness.  Psychiatric/Behavioral: Negative.  The patient is not nervous/anxious.     As per HPI. Otherwise, a complete review of systems is negative.  PAST MEDICAL HISTORY: Past Medical History:  Diagnosis Date  . Atrial fibrillation ([place])   . Bronchitis   . Cancer ([place])    Left leg growth, kidneys, lungs and breasts  .  Carcinoma of unknown primary ([place])   . COPD (chronic obstructive pulmonary disease) ([place])   . Diabetes mellitus without complication ([place])   . Hyperlipidemia   . Hypertension   . Pancreatitis   . Pneumonia   . Stroke ([place])    TIA's  . Vitamin B12 deficiency     PAST SURGICAL HISTORY: Past Surgical History:  Procedure Laterality Date  . ABDOMINAL HYSTERECTOMY  1975  . BLADDER SUSPENSION  1989  . COLONOSCOPY    . COLONOSCOPY WITH PROPOFOL N/A 01/25/2018   Procedure: COLONOSCOPY WITH PROPOFOL;  Surgeon: [name], MD;  Location: [facility] ENDOSCOPY;  Service: Endoscopy;  Laterality: N/A;  . ESOPHAGOGASTRODUODENOSCOPY (EGD) WITH PROPOFOL N/A 01/24/2018   Procedure: ESOPHAGOGASTRODUODENOSCOPY (EGD) WITH PROPOFOL;  Surgeon: [name], MD;  Location: [facility] ENDOSCOPY;  Service: Endoscopy;  Laterality: N/A;    FAMILY HISTORY: Family History  Problem Relation Age of Onset  . Cancer Father   . Diabetes Brother     ADVANCED DIRECTIVES (Y/N):  N  HEALTH MAINTENANCE: Social History   Tobacco Use  . Smoking status: Former Smoker    Packs/day: 0.50    Years: 15.00    Pack years: 7.50    Types: Cigarettes    Last attempt to quit: 11/15/1988    Years since quitting: 29.3  . Smokeless tobacco: Never Used  Substance Use Topics  . Alcohol use: No    Alcohol/week: 0.0 oz  . Drug use: No     Colonoscopy:  PAP:  Bone density:  Lipid panel:  Allergies  Allergen Reactions  . Sulfa Antibiotics Swelling  . Celecoxib Nausea And Vomiting  . Acetaminophen Itching  . Codeine Rash  .  Lyrica [Pregabalin] Rash  . Penicillin G Rash    Has patient had a PCN reaction causing immediate rash, facial/tongue/throat swelling, SOB or lightheadedness with hypotension: Yes Has patient had a PCN reaction causing severe rash involving mucus membranes or skin necrosis: No Has patient had a PCN reaction that required hospitalization: No Has patient had a PCN reaction occurring within the last 10 years:  Unknown If all of the above answers are "NO", then may proceed with Cephalosporin use.  Marland Kitchen Petrolatum-Zinc Oxide Rash    Current Outpatient Medications  Medication Sig Dispense Refill  . amLODipine-benazepril (LOTREL) 5-20 MG capsule TAKE 1 CAPSULE BY MOUTH EVERY DAY 90 capsule 3  . b complex vitamins capsule Take 1 capsule by mouth daily.    Marland Kitchen BREO ELLIPTA 100-25 MCG/INH AEPB USE 1 INHALATION ONCE DAILY 60 each 1  . desloratadine (CLARINEX) 5 MG tablet Take 1 tablet (5 mg total) by mouth daily. 30 tablet 3  . furosemide (LASIX) 20 MG tablet Take 20 mg by mouth daily.     Marland Kitchen gabapentin (NEURONTIN) 300 MG capsule Take 2 capsules (600 mg total) by mouth at bedtime. 60 capsule 5  . glipiZIDE (GLUCOTROL XL) 2.5 MG 24 hr tablet TAKE ONE TABLET DAILY 90 tablet 0  . hydrochlorothiazide (HYDRODIURIL) 25 MG tablet Take 1 tablet (25 mg total) by mouth daily. 30 tablet 5  . metFORMIN (GLUCOPHAGE) 500 MG tablet TAKE ONE TABLET BY MOUTH TWICE DAILY (Patient taking differently: take 750mg  daily) 180 tablet 0  . montelukast (SINGULAIR) 10 MG tablet Take 10 mg by mouth at bedtime.    Marland Kitchen oxybutynin (DITROPAN-XL) 10 MG 24 hr tablet Take 1 tablet (10 mg total) by mouth daily. 30 tablet 5  . pravastatin (PRAVACHOL) 40 MG tablet Take 1 tablet (40 mg total) by mouth daily. 90 tablet 1  . predniSONE (STERAPRED UNI-PAK 48 TAB) 10 MG (48) TBPK tablet 12 day taper - take by mouth as directed for 12 days 48 tablet 0  . theophylline (THEO-24) 100 MG 24 hr capsule Take 100 mg by mouth daily.    Marland Kitchen triamcinolone (KENALOG) 0.025 % cream Apply 1 application topically 2 (two) times daily. 80 g 2  . Umeclidinium-Vilanterol (ANORO ELLIPTA) 62.5-25 MCG/INH AEPB Inhale 1 puff into the lungs daily.     Marland Kitchen warfarin (COUMADIN) 4 MG tablet Take 1 tablet (4 mg total) by mouth daily at 6 PM. Take 1 tablet every tue, wed, thur, sat, and sun. Take 1/2 tablet very Monday and friday 30 tablet 5  . albuterol (PROVENTIL HFA;VENTOLIN HFA) 108 (90  Base) MCG/ACT inhaler Inhale 1 puff into the lungs every 4 (four) hours as needed for wheezing or shortness of breath.    . famotidine (PEPCID) 20 MG tablet Take 1 tablet (20 mg total) by mouth 2 (two) times daily. (Patient not taking: Reported on 03/30/2018) 30 tablet 0  . glucose blood test strip 1 each by Other route as needed for other. Use as instructed (Patient not taking: Reported on 03/30/2018) 100 each 1   No current facility-administered medications for this visit.     OBJECTIVE: Vitals:   03/30/18 1457  BP: 133/60  Pulse: 75  Resp: 20  Temp: (!) 97.5 F (36.4 C)     Body mass index is 27.85 kg/m.    ECOG FS:1 - Symptomatic but completely ambulatory  General: Well-developed, well-nourished, no acute distress.  Sitting in a wheelchair. Eyes: Pink conjunctiva, anicteric sclera. Lungs: Clear to auscultation bilaterally. Heart: Regular rate  and rhythm. No rubs, murmurs, or gallops. Abdomen: Soft, nontender, nondistended. No organomegaly noted, normoactive bowel sounds. Musculoskeletal: No edema, cyanosis, or clubbing. Neuro: Alert, answering all questions appropriately. Cranial nerves grossly intact. Skin: No rashes or petechiae noted. Psych: Normal affect.   LAB RESULTS:  Lab Results  Component Value Date   NA 136 01/25/2018   K 4.2 01/25/2018   CL 105 01/25/2018   CO2 23 01/25/2018   GLUCOSE 136 (H) 01/25/2018   BUN 11 01/25/2018   CREATININE 0.63 01/25/2018   CALCIUM 8.4 (L) 01/25/2018   PROT 6.3 (L) 01/23/2018   ALBUMIN 3.7 01/23/2018   AST 21 01/23/2018   ALT 15 01/23/2018   ALKPHOS 67 01/23/2018   BILITOT 0.5 01/23/2018   GFRNONAA >60 01/25/2018   GFRAA >60 01/25/2018    Lab Results  Component Value Date   WBC 6.0 01/25/2018   NEUTROABS 13.8 (H) 12/03/2016   HGB 9.2 (L) 01/26/2018   HCT 24.2 (L) 01/25/2018   MCV 85.9 01/25/2018   PLT 257 01/25/2018     STUDIES: Nm Pet Image Restage (ps) Whole Body  Result Date: 03/28/2018 CLINICAL DATA:   Subsequent treatment strategy for soft tissue sarcoma. EXAM: NUCLEAR MEDICINE PET WHOLE BODY TECHNIQUE: 8.22 mCi F-18 FDG was injected intravenously. Full-ring PET imaging was performed from the skull base to thigh after the radiotracer. CT data was obtained and used for attenuation correction and anatomic localization. Fasting blood glucose: 170 mg/dl COMPARISON:  PET-CT dated 10/28/2016 FINDINGS: Mediastinal blood pool activity: SUV max 2.1 HEAD/NECK: No hypermetabolic cervical lymphadenopathy. Incidental CT findings: none CHEST: No hypermetabolic thoracic lymphadenopathy. No hypermetabolic pulmonary nodules. 5 mm subpleural nodule in the posterior right upper lobe (series 3/image 117), previously 7 mm. Additional bilateral pulmonary nodules have resolved. Incidental CT findings: Atherosclerotic calcifications of the aortic arch. Coronary atherosclerosis the LAD and left circumflex. ABDOMEN/PELVIS: No hypermetabolic abdominopelvic lymphadenopathy. No abnormal hypermetabolism in the liver, spleen, pancreas, or adrenal glands. Incidental CT findings: Atherosclerotic calcifications the abdominal aorta and branch vessels. Sigmoid diverticulosis, without evidence of diverticulitis. Moderate colonic stool burden. SKELETON: No focal hypermetabolic activity to suggest skeletal metastasis. Incidental CT findings: Degenerative changes of the visualized thoracolumbar spine. EXTREMITIES: 4.8 x 3.6 cm soft tissue lesion in the medial left upper thigh, with only minimal hypermetabolism peripherally, max SUV 2.0. Previously this measured 5.8 x 4.8 cm. Incidental CT findings: none IMPRESSION: 4.8 x 3.6 cm soft tissue lesion in the medial left upper thigh, decreased. 5 mm nodule in the posterior right upper lobe, decreased. Additional bilateral pulmonary nodules have resolved. No evidence of new/progressive metastatic disease. Electronically Signed   By: Julian Hy M.D.   On: 03/28/2018 16:14    ASSESSMENT: Sarcoma of  left thigh  PLAN:    1. Sarcoma of left thigh: PET scan results reviewed independently and reported as above with persistent lesion in left medial thigh, but improved over the past 18 months.  Previously, she received XRT to her left thigh and received 3 doses of Keytruda most recently in December 2017.  Patient elected to discontinue all treatments at that time.  Hospice was discussed, but never pursued.  No further interventions are needed.  No further follow-up has been scheduled.    Approximately 20 minutes spent in discussion of which greater than 50% was consultation.    Patient expressed understanding and was in agreement with this plan. She also understands that She can call clinic at any time with any questions, concerns, or complaints.   Cancer  Staging No matching staging information was found for the patient.  Lloyd Huger, MD   04/01/2018 9:06 AM

## 2018-03-27 NOTE — Telephone Encounter (Signed)
Claiborne Billings from the PET department called to clarify the order for patient. She stated that last PET was whole body and tomorrow's is skull base to thigh.

## 2018-03-28 ENCOUNTER — Other Ambulatory Visit: Payer: Self-pay | Admitting: *Deleted

## 2018-03-28 ENCOUNTER — Encounter
Admission: RE | Admit: 2018-03-28 | Discharge: 2018-03-28 | Disposition: A | Discharge status: Home / Self Care | Payer: Medicare Other | Source: Ambulatory Visit | Attending: Oncology | Admitting: Oncology

## 2018-03-28 DIAGNOSIS — C499 Malignant neoplasm of connective and soft tissue, unspecified: Secondary | ICD-10-CM | POA: Diagnosis not present

## 2018-03-28 DIAGNOSIS — C419 Malignant neoplasm of bone and articular cartilage, unspecified: Secondary | ICD-10-CM | POA: Insufficient documentation

## 2018-03-28 LAB — GLUCOSE, CAPILLARY: Glucose-Capillary: 170 mg/dL — ABNORMAL HIGH (ref 65–99)

## 2018-03-28 MED ORDER — FLUDEOXYGLUCOSE F - 18 (FDG) INJECTION
8.2200 | Freq: Once | INTRAVENOUS | Status: AC | PRN
  Administered 2018-03-28: 8.22 via INTRAVENOUS

## 2018-03-30 ENCOUNTER — Other Ambulatory Visit: Payer: Self-pay

## 2018-03-30 ENCOUNTER — Encounter: Payer: Self-pay | Admitting: Oncology

## 2018-03-30 ENCOUNTER — Inpatient Hospital Stay (HOSPITAL_BASED_OUTPATIENT_CLINIC_OR_DEPARTMENT_OTHER): Payer: Medicare Other | Admitting: Oncology

## 2018-03-30 VITALS — BP 133/60 | HR 75 | Temp 97.5°F | Resp 20 | Wt 157.2 lb

## 2018-03-30 DIAGNOSIS — Z87891 Personal history of nicotine dependence: Secondary | ICD-10-CM

## 2018-03-30 DIAGNOSIS — L299 Pruritus, unspecified: Secondary | ICD-10-CM | POA: Diagnosis not present

## 2018-03-30 DIAGNOSIS — C7989 Secondary malignant neoplasm of other specified sites: Secondary | ICD-10-CM | POA: Diagnosis not present

## 2018-03-30 DIAGNOSIS — C4922 Malignant neoplasm of connective and soft tissue of left lower limb, including hip: Secondary | ICD-10-CM

## 2018-03-30 NOTE — Progress Notes (Signed)
Here for follow up. Per pt less " itching " legs are still aching "its in my muscles "

## 2018-03-31 ENCOUNTER — Encounter: Payer: Self-pay | Admitting: Nurse Practitioner

## 2018-04-19 DIAGNOSIS — L239 Allergic contact dermatitis, unspecified cause: Secondary | ICD-10-CM | POA: Insufficient documentation

## 2018-04-25 ENCOUNTER — Other Ambulatory Visit: Payer: Self-pay | Admitting: Internal Medicine

## 2018-04-25 MED ORDER — GLIPIZIDE ER 2.5 MG PO TB24: 2.5000 mg | ORAL_TABLET | Freq: Every day | ORAL | 1 refills | Status: DC

## 2018-05-30 ENCOUNTER — Other Ambulatory Visit: Payer: Self-pay | Admitting: Internal Medicine

## 2018-06-29 ENCOUNTER — Other Ambulatory Visit: Payer: Self-pay

## 2018-06-29 DIAGNOSIS — I10 Essential (primary) hypertension: Secondary | ICD-10-CM

## 2018-06-29 MED ORDER — HYDROCHLOROTHIAZIDE 25 MG PO TABS: 25.0000 mg | ORAL_TABLET | Freq: Every day | ORAL | 5 refills | Status: DC

## 2018-06-30 ENCOUNTER — Ambulatory Visit (INDEPENDENT_AMBULATORY_CARE_PROVIDER_SITE_OTHER): Payer: Medicare Other | Admitting: Nurse Practitioner

## 2018-06-30 ENCOUNTER — Encounter: Payer: Self-pay | Admitting: Nurse Practitioner

## 2018-06-30 VITALS — BP 140/68 | HR 68 | Resp 16 | Ht 63.0 in | Wt 158.6 lb

## 2018-06-30 DIAGNOSIS — I4891 Unspecified atrial fibrillation: Secondary | ICD-10-CM

## 2018-06-30 DIAGNOSIS — Z0001 Encounter for general adult medical examination with abnormal findings: Secondary | ICD-10-CM

## 2018-06-30 DIAGNOSIS — E1165 Type 2 diabetes mellitus with hyperglycemia: Secondary | ICD-10-CM | POA: Diagnosis not present

## 2018-06-30 DIAGNOSIS — Z5181 Encounter for therapeutic drug level monitoring: Secondary | ICD-10-CM | POA: Diagnosis not present

## 2018-06-30 DIAGNOSIS — I10 Essential (primary) hypertension: Secondary | ICD-10-CM

## 2018-06-30 LAB — POCT GLYCOSYLATED HEMOGLOBIN (HGB A1C): Hemoglobin A1C: 6.3 % — AB (ref 4.0–5.6)

## 2018-06-30 LAB — POCT INR: INR: 5.6 — AB (ref 2.0–3.0)

## 2018-06-30 NOTE — Progress Notes (Signed)
[facility] [place], [place] [address]  Internal MEDICINE  Office Visit Note  Patient Name: Monica [name]  [id number]  [id number]  Date of Service: 07/05/2018  Chief Complaint  Patient presents with  . Annual Exam    3 month awv  . Diabetes     Diabetes  She presents for her follow-up diabetic visit. She has type 2 diabetes mellitus. Her disease course has been stable. There are no hypoglycemic associated symptoms. Pertinent negatives for hypoglycemia include no headaches, nervousness/anxiousness or tremors. There are no diabetic associated symptoms. Pertinent negatives for diabetes include no chest pain. There are no hypoglycemic complications. Symptoms are stable. Diabetic complications include peripheral neuropathy and PVD. Risk factors for coronary artery disease include dyslipidemia, diabetes mellitus, hypertension and post-menopausal. Current diabetic treatment includes oral agent (monotherapy). She is compliant with treatment all of the time. Her weight is stable. She is following a generally healthy diet. Meal planning includes avoidance of concentrated sweets. She has not had a previous visit with a dietitian. She participates in exercise intermittently. There is no change in her home blood glucose trend. An ACE inhibitor/angiotensin II receptor blocker is being taken. She does not see a podiatrist.Eye exam is current.   Pt is here for routine health maintenance examination  Current Medication: Outpatient Encounter Medications as of 06/30/2018  Medication Sig  . albuterol (PROVENTIL HFA;VENTOLIN HFA) 108 (90 Base) MCG/ACT inhaler Inhale 1 puff into the lungs every 4 (four) hours as needed for wheezing or shortness of breath.  Marland Kitchen amLODipine-benazepril (LOTREL) 5-20 MG capsule TAKE 1 CAPSULE BY MOUTH EVERY DAY  . b complex vitamins capsule Take 1 capsule by mouth daily.  Marland Kitchen BREO ELLIPTA 100-25 MCG/INH AEPB TAKE 1 PUFF ONCE A DAY  . desloratadine (CLARINEX)  5 MG tablet Take 1 tablet (5 mg total) by mouth daily.  . furosemide (LASIX) 20 MG tablet Take 20 mg by mouth daily.   Marland Kitchen gabapentin (NEURONTIN) 300 MG capsule Take 2 capsules (600 mg total) by mouth at bedtime.  Marland Kitchen glipiZIDE (GLUCOTROL XL) 2.5 MG 24 hr tablet Take 1 tablet (2.5 mg total) by mouth daily.  . hydrochlorothiazide (HYDRODIURIL) 25 MG tablet Take 1 tablet (25 mg total) by mouth daily.  . metFORMIN (GLUCOPHAGE) 500 MG tablet TAKE ONE TABLET BY MOUTH TWICE DAILY (Patient taking differently: take 750mg  daily)  . metFORMIN (GLUCOPHAGE-XR) 500 MG 24 hr tablet TAKE 1 AND 1/2 TABLETS DAILY  . montelukast (SINGULAIR) 10 MG tablet Take 10 mg by mouth at bedtime.  Marland Kitchen oxybutynin (DITROPAN-XL) 10 MG 24 hr tablet Take 1 tablet (10 mg total) by mouth daily.  . pravastatin (PRAVACHOL) 40 MG tablet Take 1 tablet (40 mg total) by mouth daily.  . predniSONE (STERAPRED UNI-PAK 48 TAB) 10 MG (48) TBPK tablet 12 day taper - take by mouth as directed for 12 days  . THEO-24 100 MG 24 hr capsule TAKE 1 CAPSULE BY MOUTH EVERY DAY  . triamcinolone (KENALOG) 0.025 % cream Apply 1 application topically 2 (two) times daily.  Marland Kitchen Umeclidinium-Vilanterol (ANORO ELLIPTA) 62.5-25 MCG/INH AEPB Inhale 1 puff into the lungs daily.   Marland Kitchen warfarin (COUMADIN) 4 MG tablet Take 1 tablet (4 mg total) by mouth daily at 6 PM. Take 1 tablet every tue, wed, thur, sat, and sun. Take 1/2 tablet very Monday and friday  . famotidine (PEPCID) 20 MG tablet Take 1 tablet (20 mg total) by mouth 2 (two) times daily. (Patient not taking: Reported on 03/30/2018)  .  glucose blood test strip 1 each by Other route as needed for other. Use as instructed (Patient not taking: Reported on 03/30/2018)   No facility-administered encounter medications on file as of 06/30/2018.     Surgical History: Past Surgical History:  Procedure Laterality Date  . ABDOMINAL HYSTERECTOMY  1975  . BLADDER SUSPENSION  1989  . COLONOSCOPY    . COLONOSCOPY WITH PROPOFOL  N/A 01/25/2018   Procedure: COLONOSCOPY WITH PROPOFOL;  Surgeon: [name], MD;  Location: [facility] ENDOSCOPY;  Service: Endoscopy;  Laterality: N/A;  . ESOPHAGOGASTRODUODENOSCOPY (EGD) WITH PROPOFOL N/A 01/24/2018   Procedure: ESOPHAGOGASTRODUODENOSCOPY (EGD) WITH PROPOFOL;  Surgeon: [name], MD;  Location: [facility] ENDOSCOPY;  Service: Endoscopy;  Laterality: N/A;    Medical History: Past Medical History:  Diagnosis Date  . Atrial fibrillation ([place])   . Bronchitis   . Cancer ([place])    Left leg growth, kidneys, lungs and breasts  . Carcinoma of unknown primary ([place])   . COPD (chronic obstructive pulmonary disease) ([place])   . Diabetes mellitus without complication ([place])   . Hyperlipidemia   . Hypertension   . Pancreatitis   . Pneumonia   . Stroke ([place])    TIA's  . Vitamin B12 deficiency     Family History: Family History  Problem Relation Age of Onset  . Cancer Father   . Diabetes Brother       Review of Systems  Constitutional: Negative for activity change, chills and unexpected weight change.  HENT: Negative for congestion, postnasal drip, rhinorrhea, sneezing and sore throat.   Eyes: Negative.  Negative for redness.  Respiratory: Negative for cough, chest tightness and shortness of breath.   Cardiovascular: Negative for chest pain and palpitations.  Gastrointestinal: Negative for abdominal pain, constipation, diarrhea, nausea and vomiting.  Endocrine:       Blood sugars doing well   Genitourinary: Negative for dysuria and frequency.  Musculoskeletal: Negative for arthralgias, back pain, joint swelling and neck pain.  Skin: Negative for rash.  Allergic/Immunologic: Positive for environmental allergies.  Neurological: Negative for tremors, numbness and headaches.  Hematological: Negative for adenopathy. Does not bruise/bleed easily.  Psychiatric/Behavioral: Negative for behavioral problems (Depression), sleep disturbance and suicidal ideas. The patient is not  nervous/anxious.      Today's Vitals   06/30/18 1416  BP: 140/68  Pulse: 68  Resp: 16  SpO2: 95%  Weight: 158 lb 9.6 oz (71.9 kg)  Height: 5\' 3"  (1.6 m)    Physical Exam  Constitutional: She is oriented to person, place, and time. She appears well-developed and well-nourished. No distress.  HENT:  Head: Normocephalic and atraumatic.  Nose: Nose normal.  Mouth/Throat: Oropharynx is clear and moist. No oropharyngeal exudate.  Eyes: Pupils are equal, round, and reactive to light. Conjunctivae and EOM are normal.  Neck: Normal range of motion. Neck supple. No JVD present. No tracheal deviation present. No thyromegaly present.  Cardiovascular: Normal rate and intact distal pulses. Exam reveals no gallop and no friction rub.  Murmur heard. Pulses:      Dorsalis pedis pulses are 1+ on the right side, and 1+ on the left side.       Posterior tibial pulses are 1+ on the right side, and 1+ on the left side.  Irregular heart rhythm.   Pulmonary/Chest: Effort normal and breath sounds normal. No respiratory distress. She has no wheezes. She has no rales. She exhibits no tenderness.  Abdominal: Soft. Bowel sounds are normal. There is no tenderness.  Musculoskeletal: Normal range  of motion.       Right foot: There is normal range of motion and no deformity.       Left foot: There is normal range of motion and no deformity.  Feet:  Right Foot:  Protective Sensation: 10 sites tested. 10 sites sensed.  Skin Integrity: Negative for erythema or warmth.  Left Foot:  Protective Sensation: 10 sites tested. 10 sites sensed.  Skin Integrity: Negative for erythema or warmth.  Lymphadenopathy:    She has no cervical adenopathy.  Neurological: She is alert and oriented to person, place, and time. No cranial nerve deficit.  Skin: Skin is warm and dry. Capillary refill takes 2 to 3 seconds. No rash noted. She is not diaphoretic.  Marland Kitchen   Psychiatric: She has a normal mood and affect. Her behavior is  normal. Judgment and thought content normal.  Nursing note and vitals reviewed.    LABS: Recent Results (from the past 2160 hour(s))  POCT HgB A1C     Status: Abnormal   Collection Time: 06/30/18  2:44 PM  Result Value Ref Range   Hemoglobin A1C 6.3 (A) 4.0 - 5.6 %   HbA1c POC (<> result, manual entry)     HbA1c, POC (prediabetic range)     HbA1c, POC (controlled diabetic range)    POCT INR     Status: Abnormal   Collection Time: 06/30/18  3:03 PM  Result Value Ref Range   INR 5.6 (A) 2.0 - 3.0    Comment: 5.6inr pt 64.7   Assessment/Plan: 1. Encounter for general adult medical examination with abnormal findings Annual wellness visit today.   2. Uncontrolled type 2 diabetes mellitus with hyperglycemia (HCC) - POCT HgB A1C 6.3 today.  Continue diabetic medication as prescribed.   3. Atrial fibrillation, unspecified type ([place]) Chronic warfarin therapy. Will re-establish at home monitoring of INR through [facility].   4. Encounter for therapeutic drug level monitoring - POCT INR 5.6 today.  Will skip next 2 doses of warfarin then go back to routine dosing pattern. Recheck INR in one week.    5. Essential hypertension Stable. continu bp medication as prescribe.d   General Counseling: Nalini verbalizes understanding of the findings of todays visit and agrees with plan of treatment. I have discussed any further diagnostic evaluation that may be needed or ordered today. We also reviewed her medications today. she has been encouraged to call the office with any questions or concerns that should arise related to todays visit.    Counseling:  Diabetes Counseling:  1. Addition of ACE inh/ ARB'S for nephroprotection. Microalbumin is updated  2. Diabetic foot care, prevention of complications. Podiatry consult 3. Exercise and lose weight.  4. Diabetic eye examination, Diabetic eye exam is updated  5. Monitor blood sugar closlely. nutrition counseling.  6. Sign and  symptoms of hypoglycemia including shaking sweating,confusion and headaches.   This patient was seen by Leretha Pol FNP Collaboration with Dr Lavera Guise as a part of collaborative care agreement  Orders Placed This Encounter  Procedures  . POCT HgB A1C  . POCT INR     Time spent: [place], MD  Internal Medicine

## 2018-07-05 ENCOUNTER — Telehealth: Payer: Self-pay

## 2018-07-05 DIAGNOSIS — Z5181 Encounter for therapeutic drug level monitoring: Secondary | ICD-10-CM | POA: Insufficient documentation

## 2018-07-05 LAB — PROTIME-INR

## 2018-07-05 NOTE — Telephone Encounter (Signed)
Hey. I finished this note.

## 2018-07-05 NOTE — Telephone Encounter (Signed)
Faxed M[facility] order to [place] home pt

## 2018-07-24 ENCOUNTER — Other Ambulatory Visit: Payer: Self-pay

## 2018-07-24 ENCOUNTER — Other Ambulatory Visit: Payer: Self-pay | Admitting: Internal Medicine

## 2018-07-24 DIAGNOSIS — L209 Atopic dermatitis, unspecified: Secondary | ICD-10-CM

## 2018-07-24 DIAGNOSIS — I4891 Unspecified atrial fibrillation: Secondary | ICD-10-CM

## 2018-07-24 DIAGNOSIS — L239 Allergic contact dermatitis, unspecified cause: Secondary | ICD-10-CM

## 2018-07-24 MED ORDER — DESLORATADINE 5 MG PO TABS
5.0000 mg | ORAL_TABLET | Freq: Every day | ORAL | 3 refills | Status: DC
Start: 2018-07-24 — End: 2018-11-27

## 2018-07-24 MED ORDER — WARFARIN SODIUM 4 MG PO TABS: 4.0000 mg | ORAL_TABLET | Freq: Every day | ORAL | 5 refills | Status: DC

## 2018-07-25 ENCOUNTER — Other Ambulatory Visit: Payer: Self-pay | Admitting: Internal Medicine

## 2018-07-25 DIAGNOSIS — I4891 Unspecified atrial fibrillation: Secondary | ICD-10-CM

## 2018-08-04 ENCOUNTER — Other Ambulatory Visit: Payer: Self-pay

## 2018-08-04 MED ORDER — GABAPENTIN 300 MG PO CAPS: 600.0000 mg | ORAL_CAPSULE | Freq: Every day | ORAL | 5 refills | Status: DC

## 2018-08-18 ENCOUNTER — Encounter: Payer: Self-pay | Admitting: Internal Medicine

## 2018-08-18 LAB — PROTIME-INR
INR: 1.6 — AB (ref 0.9–1.1)
INR: 1.8 — AB (ref 0.9–1.1)

## 2018-08-18 NOTE — Progress Notes (Signed)
SCANNED IN INR RESULTS TESTED ON 08/03/18.

## 2018-08-18 NOTE — Progress Notes (Signed)
SCANNED INR RESULTS TESTED ON 08/11/18.

## 2018-08-22 DIAGNOSIS — Z23 Encounter for immunization: Secondary | ICD-10-CM | POA: Diagnosis not present

## 2018-08-25 ENCOUNTER — Encounter: Payer: Self-pay | Admitting: Nurse Practitioner

## 2018-08-25 ENCOUNTER — Encounter: Payer: Self-pay | Admitting: Adult Health

## 2018-08-25 LAB — PROTIME-INR: INR: 3.1 — AB (ref 0.9–1.1)

## 2018-08-25 NOTE — Progress Notes (Signed)
SCANNED IN INR TEST HISTORY FROM 08/03/18-08/11/18.

## 2018-08-25 NOTE — Progress Notes (Signed)
SCANNED IN INR RESULT TESTED ON 08/23/18.

## 2018-08-29 ENCOUNTER — Other Ambulatory Visit: Payer: Self-pay

## 2018-08-29 DIAGNOSIS — I4891 Unspecified atrial fibrillation: Secondary | ICD-10-CM

## 2018-08-29 MED ORDER — WARFARIN SODIUM 4 MG PO TABS: ORAL_TABLET | ORAL | 5 refills | Status: DC

## 2018-08-29 MED ORDER — THEOPHYLLINE ER 100 MG PO CP24: ORAL_CAPSULE | ORAL | 3 refills | Status: DC

## 2018-08-30 DIAGNOSIS — I480 Paroxysmal atrial fibrillation: Secondary | ICD-10-CM | POA: Diagnosis not present

## 2018-09-01 ENCOUNTER — Encounter: Payer: Self-pay | Admitting: Nurse Practitioner

## 2018-09-01 LAB — PROTIME-INR: INR: 3.2 — AB (ref 0.9–1.1)

## 2018-09-06 LAB — PROTIME-INR: INR: 2.2 — AB (ref 0.9–1.1)

## 2018-09-11 ENCOUNTER — Other Ambulatory Visit: Payer: Self-pay | Admitting: Internal Medicine

## 2018-09-13 LAB — PROTIME-INR: INR: 2.3 — AB (ref 0.9–1.1)

## 2018-10-03 ENCOUNTER — Encounter: Payer: Self-pay | Admitting: Adult Health

## 2018-10-03 LAB — PROTIME-INR

## 2018-10-09 DIAGNOSIS — I4821 Permanent atrial fibrillation: Secondary | ICD-10-CM | POA: Diagnosis not present

## 2018-10-09 DIAGNOSIS — I480 Paroxysmal atrial fibrillation: Secondary | ICD-10-CM | POA: Diagnosis not present

## 2018-10-13 DIAGNOSIS — L03116 Cellulitis of left lower limb: Secondary | ICD-10-CM | POA: Diagnosis not present

## 2018-10-13 DIAGNOSIS — L989 Disorder of the skin and subcutaneous tissue, unspecified: Secondary | ICD-10-CM | POA: Diagnosis not present

## 2018-10-17 ENCOUNTER — Telehealth: Payer: Self-pay

## 2018-10-17 NOTE — Telephone Encounter (Signed)
Spoke with pt about her inr is high 4.8 pt already skip last night and continue same as directed and also pt is on antibiotic and she going to finish on sat and she will recheck inr

## 2018-10-30 ENCOUNTER — Encounter: Payer: Self-pay | Admitting: Nurse Practitioner

## 2018-10-30 ENCOUNTER — Ambulatory Visit (INDEPENDENT_AMBULATORY_CARE_PROVIDER_SITE_OTHER): Payer: Medicare Other | Admitting: Nurse Practitioner

## 2018-10-30 VITALS — BP 152/64 | HR 85 | Resp 16 | Ht 63.0 in | Wt 161.0 lb

## 2018-10-30 DIAGNOSIS — Z5181 Encounter for therapeutic drug level monitoring: Secondary | ICD-10-CM | POA: Diagnosis not present

## 2018-10-30 DIAGNOSIS — I4891 Unspecified atrial fibrillation: Secondary | ICD-10-CM

## 2018-10-30 DIAGNOSIS — I10 Essential (primary) hypertension: Secondary | ICD-10-CM | POA: Diagnosis not present

## 2018-10-30 DIAGNOSIS — L03116 Cellulitis of left lower limb: Secondary | ICD-10-CM

## 2018-10-30 DIAGNOSIS — E1165 Type 2 diabetes mellitus with hyperglycemia: Secondary | ICD-10-CM

## 2018-10-30 LAB — PROTIME-INR: INR: 4.8 — AB (ref 0.9–1.1)

## 2018-10-30 LAB — POCT INR: INR: 1.6 — AB (ref 2.0–3.0)

## 2018-10-30 LAB — POCT GLYCOSYLATED HEMOGLOBIN (HGB A1C): Hemoglobin A1C: 6.6 % — AB (ref 4.0–5.6)

## 2018-10-30 MED ORDER — DOXYCYCLINE HYCLATE 100 MG PO CAPS: 100.0000 mg | ORAL_CAPSULE | Freq: Two times a day (BID) | ORAL | 0 refills | Status: DC

## 2018-10-30 NOTE — Progress Notes (Signed)
[facility] [place], [place] [address]  Internal MEDICINE  Office Visit Note  Patient Name: Monica [name]  [id number]  [id number]  Date of Service: 11/01/2018  Chief Complaint  Patient presents with  . Medical Management of Chronic Issues    4 month follow up. Pt was seen at the walkin clinic and was diagnosed with MRSAshe is concerned about her right leg that has been swelling a lot more recently and painful along with being red during the night time, she is currently dressing a womb on the right leg three times a day and putting an antibiotic cream on it as well.   . Diabetes    The patient is here for routine follow up visit. Today, she is concerned about redness and swelling in left lowe extremity. Was recently treated for MRSA and cellulitis in the leg. Was on doxycycline 100mg  twice daily for 7 days. Also using a topical antibiotic cream twice daily, which she continues to use. Has noted a few new, round lesion on the left lower leg. Areas are very itch and surrounded with redness. States that skin, overall,  is a little better, but swelling has not improved.  By the end of the evening, her leg is very swollen and tender and has moderate redness.  Blood sugars are well controlled. INRs have been up and down but from antibiotic use. Checked once weekly. Warfarin dosing and adjusted as needed based on INR results.       Current Medication: Outpatient Encounter Medications as of 10/30/2018  Medication Sig  . albuterol (PROVENTIL HFA;VENTOLIN HFA) 108 (90 Base) MCG/ACT inhaler Inhale 1 puff into the lungs every 4 (four) hours as needed for wheezing or shortness of breath.  Marland Kitchen amLODipine-benazepril (LOTREL) 5-20 MG capsule TAKE 1 CAPSULE BY MOUTH EVERY DAY  . b complex vitamins capsule Take 1 capsule by mouth daily.  Marland Kitchen BREO ELLIPTA 100-25 MCG/INH AEPB TAKE 1 PUFF ONCE A DAY  . desloratadine (CLARINEX) 5 MG tablet Take 1 tablet (5 mg total) by mouth daily.  .  furosemide (LASIX) 20 MG tablet Take 20 mg by mouth daily.   Marland Kitchen gabapentin (NEURONTIN) 300 MG capsule Take 2 capsules (600 mg total) by mouth at bedtime.  Marland Kitchen glipiZIDE (GLUCOTROL XL) 2.5 MG 24 hr tablet Take 1 tablet (2.5 mg total) by mouth daily.  . hydrochlorothiazide (HYDRODIURIL) 25 MG tablet Take 1 tablet (25 mg total) by mouth daily.  . metFORMIN (GLUCOPHAGE) 500 MG tablet TAKE ONE TABLET BY MOUTH TWICE DAILY (Patient taking differently: take 750mg  daily)  . metFORMIN (GLUCOPHAGE-XR) 500 MG 24 hr tablet TAKE 1 AND 1/2 TABLETS DAILY  . montelukast (SINGULAIR) 10 MG tablet Take 10 mg by mouth at bedtime.  Marland Kitchen oxybutynin (DITROPAN-XL) 10 MG 24 hr tablet Take 1 tablet (10 mg total) by mouth daily.  . pravastatin (PRAVACHOL) 40 MG tablet TAKE 1 TABLET DAILY  . predniSONE (STERAPRED UNI-PAK 48 TAB) 10 MG (48) TBPK tablet 12 day taper - take by mouth as directed for 12 days  . PROAIR HFA 108 (90 Base) MCG/ACT inhaler TAKE 1 PUFF EVERY 4 HOURS AS NEEDED  . theophylline (THEO-24) 100 MG 24 hr capsule TAKE 1 CAPSULE BY MOUTH EVERY DAY  . triamcinolone (KENALOG) 0.025 % cream Apply 1 application topically 2 (two) times daily.  Marland Kitchen Umeclidinium-Vilanterol (ANORO ELLIPTA) 62.5-25 MCG/INH AEPB Inhale 1 puff into the lungs daily.   Marland Kitchen warfarin (COUMADIN) 4 MG tablet TAKE ONE TABLET EVERY DAY EXCEPT  MONDAY TAKE 1/2 TABLET  . doxycycline (VIBRAMYCIN) 100 MG capsule Take 1 capsule (100 mg total) by mouth 2 (two) times daily.  . famotidine (PEPCID) 20 MG tablet Take 1 tablet (20 mg total) by mouth 2 (two) times daily. (Patient not taking: Reported on 03/30/2018)  . glucose blood test strip 1 each by Other route as needed for other. Use as instructed (Patient not taking: Reported on 03/30/2018)   No facility-administered encounter medications on file as of 10/30/2018.     Surgical History: Past Surgical History:  Procedure Laterality Date  . ABDOMINAL HYSTERECTOMY  1975  . BLADDER SUSPENSION  1989  . COLONOSCOPY     . COLONOSCOPY WITH PROPOFOL N/A 01/25/2018   Procedure: COLONOSCOPY WITH PROPOFOL;  Surgeon: [name], MD;  Location: [facility] ENDOSCOPY;  Service: Endoscopy;  Laterality: N/A;  . ESOPHAGOGASTRODUODENOSCOPY (EGD) WITH PROPOFOL N/A 01/24/2018   Procedure: ESOPHAGOGASTRODUODENOSCOPY (EGD) WITH PROPOFOL;  Surgeon: [name], MD;  Location: [facility] ENDOSCOPY;  Service: Endoscopy;  Laterality: N/A;    Medical History: Past Medical History:  Diagnosis Date  . Atrial fibrillation ([place])   . Bronchitis   . Cancer ([place])    Left leg growth, kidneys, lungs and breasts  . Carcinoma of unknown primary ([place])   . COPD (chronic obstructive pulmonary disease) ([place])   . Diabetes mellitus without complication ([place])   . Hyperlipidemia   . Hypertension   . Pancreatitis   . Pneumonia   . Stroke ([place])    TIA's  . Vitamin B12 deficiency     Family History: Family History  Problem Relation Age of Onset  . Cancer Father   . Diabetes Brother     Social History   Socioeconomic History  . Marital status: Divorced    Spouse name: Not on file  . Number of children: Not on file  . Years of education: Not on file  . Highest education level: Not on file  Occupational History  . Not on file  Social Needs  . Financial resource strain: Not on file  . Food insecurity:    Worry: Not on file    Inability: Not on file  . Transportation needs:    Medical: Not on file    Non-medical: Not on file  Tobacco Use  . Smoking status: Former Smoker    Packs/day: 0.50    Years: 15.00    Pack years: 7.50    Types: Cigarettes    Last attempt to quit: 11/15/1988    Years since quitting: 29.9  . Smokeless tobacco: Never Used  Substance and Sexual Activity  . Alcohol use: No    Alcohol/week: 0.0 standard drinks  . Drug use: No  . Sexual activity: Not on file    Comment: hysterectomy   Lifestyle  . Physical activity:    Days per week: Not on file    Minutes per session: Not on file  . Stress: Not on file   Relationships  . Social connections:    Talks on phone: Not on file    Gets together: Not on file    Attends religious service: Not on file    Active member of club or organization: Not on file    Attends meetings of clubs or organizations: Not on file    Relationship status: Not on file  . Intimate partner violence:    Fear of current or ex partner: Not on file    Emotionally abused: Not on file    Physically abused: Not on file  Forced sexual activity: Not on file  Other Topics Concern  . Not on file  Social History Narrative  . Not on file      Review of Systems  Constitutional: Positive for fatigue. Negative for chills and unexpected weight change.  HENT: Negative for congestion, postnasal drip, rhinorrhea, sneezing and sore throat.   Respiratory: Positive for shortness of breath and wheezing. Negative for cough and chest tightness.   Cardiovascular: Positive for leg swelling. Negative for chest pain and palpitations.  Gastrointestinal: Negative for abdominal pain, constipation, diarrhea, nausea and vomiting.  Endocrine: Negative for polydipsia and polyuria.       Blood sugars doing well   Musculoskeletal: Positive for arthralgias and myalgias. Negative for back pain, joint swelling and neck pain.  Skin: Negative for rash.       Reddened skin with moderate swelling of left lower extremity. Very tender and gets very swollen by the end of the day. Just finished a 7 day round of doxycycline.   Allergic/Immunologic: Positive for environmental allergies.  Neurological: Negative for dizziness, tremors, numbness and headaches.  Hematological: Negative for adenopathy. Does not bruise/bleed easily.  Psychiatric/Behavioral: Negative for behavioral problems (Depression), sleep disturbance and suicidal ideas. The patient is not nervous/anxious.    Today's Vitals   10/30/18 1428  BP: (!) 152/64  Pulse: 85  Resp: 16  SpO2: 97%  Weight: 161 lb (73 kg)  Height: 5\' 3"  (1.6 m)     Physical Exam Vitals signs and nursing note reviewed.  Constitutional:      General: She is not in acute distress.    Appearance: She is well-developed. She is not diaphoretic.  HENT:     Head: Normocephalic and atraumatic.     Nose: Nose normal.     Mouth/Throat:     Pharynx: No oropharyngeal exudate.  Eyes:     Conjunctiva/sclera: Conjunctivae normal.     Pupils: Pupils are equal, round, and reactive to light.  Neck:     Musculoskeletal: Normal range of motion and neck supple.     Thyroid: No thyromegaly.     Vascular: No JVD.     Trachea: No tracheal deviation.  Cardiovascular:     Rate and Rhythm: Normal rate.     Pulses:          Dorsalis pedis pulses are 1+ on the right side and 1+ on the left side.       Posterior tibial pulses are 1+ on the right side and 1+ on the left side.     Heart sounds: Murmur present. No friction rub. No gallop.      Comments: Irregular heart rhythm.  Pulmonary:     Effort: Pulmonary effort is normal. No respiratory distress.     Breath sounds: Normal breath sounds. No wheezing or rales.  Chest:     Chest wall: No tenderness.  Abdominal:     General: Bowel sounds are normal.     Palpations: Abdomen is soft.     Tenderness: There is no abdominal tenderness.  Musculoskeletal: Normal range of motion.     Right foot: Normal range of motion. No deformity.     Left foot: Normal range of motion. No deformity.  Feet:     Right foot:     Protective Sensation: 10 sites tested. 10 sites sensed.     Skin integrity: No erythema or warmth.     Left foot:     Protective Sensation: 10 sites tested. 10 sites sensed.  Skin integrity: No erythema or warmth.  Lymphadenopathy:     Cervical: No cervical adenopathy.  Skin:    General: Skin is warm and dry.     Capillary Refill: Capillary refill takes 2 to 3 seconds.     Findings: Erythema and rash present.     Comments: Significant redness, swelling, and warmth t the left lower extremity.    Neurological:     Mental Status: She is alert and oriented to person, place, and time.     Cranial Nerves: No cranial nerve deficit.  Psychiatric:        Behavior: Behavior normal.        Thought Content: Thought content normal.        Judgment: Judgment normal.    Assessment/Plan: 1. Uncontrolled type 2 diabetes mellitus with hyperglycemia (HCC) - POCT HgB A1C 6.8 today. Continue diabetic medications as prescribed.   2. Cellulitis of left lower leg Repeat doxyxyxline 100mg  capsules twice daily for next 14 days. Continue to apply topical ointment as prescribed to help clear infection. Keep open wounds clean and dry.  - doxycycline (VIBRAMYCIN) 100 MG capsule; Take 1 capsule (100 mg total) by mouth 2 (two) times daily.  Dispense: 28 capsule; Refill: 0  3. Atrial fibrillation, unspecified type ([place]) - POCT INR 1.6 today. Patient to take double dose warfarin tonight, then go back to usual dosing regimen.   4. Essential hypertension Stable. Continue bp medication as prescribed   General Counseling: Hamsini verbalizes understanding of the findings of todays visit and agrees with plan of treatment. I have discussed any further diagnostic evaluation that may be needed or ordered today. We also reviewed her medications today. she has been encouraged to call the office with any questions or concerns that should arise related to todays visit.  Diabetes Counseling:  1. Addition of ACE inh/ ARB'S for nephroprotection. Microalbumin is updated  2. Diabetic foot care, prevention of complications. Podiatry consult 3. Exercise and lose weight.  4. Diabetic eye examination, Diabetic eye exam is updated  5. Monitor blood sugar closlely. nutrition counseling.  6. Sign and symptoms of hypoglycemia including shaking sweating,confusion and headaches.  This patient was seen by Leretha Pol FNP Collaboration with Dr Lavera Guise as a part of collaborative care agreement  Orders Placed This Encounter   Procedures  . POCT HgB A1C  . POCT INR    Meds ordered this encounter  Medications  . doxycycline (VIBRAMYCIN) 100 MG capsule    Sig: Take 1 capsule (100 mg total) by mouth 2 (two) times daily.    Dispense:  28 capsule    Refill:  0    Order Specific Question:   Supervising Provider    Answer:   Lavera Guise [[id number]]    Time spent: 13 Minutes      Dr Lavera Guise Internal medicine

## 2018-11-01 DIAGNOSIS — L03116 Cellulitis of left lower limb: Secondary | ICD-10-CM | POA: Insufficient documentation

## 2018-11-13 ENCOUNTER — Ambulatory Visit (INDEPENDENT_AMBULATORY_CARE_PROVIDER_SITE_OTHER): Payer: Medicare Other | Admitting: Nurse Practitioner

## 2018-11-13 ENCOUNTER — Ambulatory Visit
Admission: RE | Admit: 2018-11-13 | Discharge: 2018-11-13 | Disposition: A | Discharge status: Home / Self Care | Payer: Medicare Other | Source: Ambulatory Visit | Attending: Nurse Practitioner | Admitting: Nurse Practitioner

## 2018-11-13 ENCOUNTER — Encounter: Payer: Self-pay | Admitting: Nurse Practitioner

## 2018-11-13 ENCOUNTER — Ambulatory Visit: Payer: Medicare Other

## 2018-11-13 VITALS — BP 130/58 | HR 90 | Resp 16 | Ht 64.0 in | Wt 157.2 lb

## 2018-11-13 DIAGNOSIS — I4891 Unspecified atrial fibrillation: Secondary | ICD-10-CM | POA: Diagnosis not present

## 2018-11-13 DIAGNOSIS — R600 Localized edema: Secondary | ICD-10-CM | POA: Insufficient documentation

## 2018-11-13 DIAGNOSIS — L03116 Cellulitis of left lower limb: Secondary | ICD-10-CM | POA: Diagnosis not present

## 2018-11-13 NOTE — Progress Notes (Signed)
[facility] [place], [place] [address]  Internal MEDICINE  Office Visit Note  Patient Name: Monica [name]  [id number]  [id number]  Date of Service: 11/13/2018  Chief Complaint  Patient presents with  . Pain    cellulitis worsening, pt was n antibiotics and she will take her last pill tonight. painful, swelling is not getting any better, pt states that it gets worse at night time, she has been keeping it elevated     The patient is here for sick visit.  Today, she is concerned about redness and swelling in left lowe extremity. Was treated on two separate occassions with antibiotics for cellulitis in left leg. She continues to have a great deal of redness and swelling.No longer hot. Previous sore lesions have healed.  States that skin, overall,  is a little better, but swelling has not improved.  By the end of the evening, her leg is very swollen and tender and has moderate redness.     Pt is here for a sick visit.     Current Medication:  Outpatient Encounter Medications as of 11/13/2018  Medication Sig  . albuterol (PROVENTIL HFA;VENTOLIN HFA) 108 (90 Base) MCG/ACT inhaler Inhale 1 puff into the lungs every 4 (four) hours as needed for wheezing or shortness of breath.  Marland Kitchen amLODipine-benazepril (LOTREL) 5-20 MG capsule TAKE 1 CAPSULE BY MOUTH EVERY DAY  . b complex vitamins capsule Take 1 capsule by mouth daily.  Marland Kitchen BREO ELLIPTA 100-25 MCG/INH AEPB TAKE 1 PUFF ONCE A DAY  . desloratadine (CLARINEX) 5 MG tablet Take 1 tablet (5 mg total) by mouth daily.  Marland Kitchen doxycycline (VIBRAMYCIN) 100 MG capsule Take 1 capsule (100 mg total) by mouth 2 (two) times daily.  . furosemide (LASIX) 20 MG tablet Take 20 mg by mouth daily.   Marland Kitchen gabapentin (NEURONTIN) 300 MG capsule Take 2 capsules (600 mg total) by mouth at bedtime.  Marland Kitchen glipiZIDE (GLUCOTROL XL) 2.5 MG 24 hr tablet Take 1 tablet (2.5 mg total) by mouth daily.  . hydrochlorothiazide (HYDRODIURIL) 25 MG tablet Take 1  tablet (25 mg total) by mouth daily.  . metFORMIN (GLUCOPHAGE) 500 MG tablet TAKE ONE TABLET BY MOUTH TWICE DAILY (Patient taking differently: take 750mg  daily)  . metFORMIN (GLUCOPHAGE-XR) 500 MG 24 hr tablet TAKE 1 AND 1/2 TABLETS DAILY  . montelukast (SINGULAIR) 10 MG tablet Take 10 mg by mouth at bedtime.  Marland Kitchen oxybutynin (DITROPAN-XL) 10 MG 24 hr tablet Take 1 tablet (10 mg total) by mouth daily.  . pravastatin (PRAVACHOL) 40 MG tablet TAKE 1 TABLET DAILY  . predniSONE (STERAPRED UNI-PAK 48 TAB) 10 MG (48) TBPK tablet 12 day taper - take by mouth as directed for 12 days  . PROAIR HFA 108 (90 Base) MCG/ACT inhaler TAKE 1 PUFF EVERY 4 HOURS AS NEEDED  . theophylline (THEO-24) 100 MG 24 hr capsule TAKE 1 CAPSULE BY MOUTH EVERY DAY  . triamcinolone (KENALOG) 0.025 % cream Apply 1 application topically 2 (two) times daily.  Marland Kitchen Umeclidinium-Vilanterol (ANORO ELLIPTA) 62.5-25 MCG/INH AEPB Inhale 1 puff into the lungs daily.   Marland Kitchen warfarin (COUMADIN) 4 MG tablet TAKE ONE TABLET EVERY DAY EXCEPT MONDAY TAKE 1/2 TABLET  . famotidine (PEPCID) 20 MG tablet Take 1 tablet (20 mg total) by mouth 2 (two) times daily. (Patient not taking: Reported on 03/30/2018)  . glucose blood test strip 1 each by Other route as needed for other. Use as instructed (Patient not taking: Reported on 03/30/2018)  No facility-administered encounter medications on file as of 11/13/2018.       Medical History: Past Medical History:  Diagnosis Date  . Atrial fibrillation ([place])   . Bronchitis   . Cancer ([place])    Left leg growth, kidneys, lungs and breasts  . Carcinoma of unknown primary ([place])   . COPD (chronic obstructive pulmonary disease) ([place])   . Diabetes mellitus without complication ([place])   . Hyperlipidemia   . Hypertension   . Pancreatitis   . Pneumonia   . Stroke ([place])    TIA's  . Vitamin B12 deficiency      Today's Vitals   11/13/18 1441  BP: (!) 130/58  Pulse: 90  Resp: 16  SpO2: 95%  Weight: 157 lb 3.2  oz (71.3 kg)  Height: 5\' 4"  (1.626 m)    Review of Systems  Constitutional: Negative for chills, fatigue and unexpected weight change.  HENT: Negative for congestion, postnasal drip, rhinorrhea, sneezing and sore throat.   Respiratory: Negative for cough, chest tightness, shortness of breath and wheezing.   Cardiovascular: Positive for leg swelling. Negative for chest pain and palpitations.  Gastrointestinal: Negative for abdominal pain, constipation, diarrhea, nausea and vomiting.  Endocrine: Negative for polydipsia and polyuria.       Blood sugars doing well   Musculoskeletal: Positive for arthralgias and myalgias. Negative for back pain, joint swelling and neck pain.  Skin: Negative for rash.       Reddened skin with moderate swelling of left lower extremity. Very tender and gets very swollen by the end of the day. Has one day left of doxycycline treatment which was prescribed for 14 days. Had previously had a 7 day treatment with doxycycline.   Allergic/Immunologic: Positive for environmental allergies.  Neurological: Positive for weakness. Negative for dizziness, tremors, numbness and headaches.  Hematological: Negative for adenopathy. Does not bruise/bleed easily.  Psychiatric/Behavioral: Negative for behavioral problems (Depression), sleep disturbance and suicidal ideas. The patient is nervous/anxious.     Physical Exam Vitals signs and nursing note reviewed.  Constitutional:      General: She is not in acute distress.    Appearance: Normal appearance. She is well-developed. She is not diaphoretic.  HENT:     Head: Normocephalic and atraumatic.     Nose: Nose normal.     Mouth/Throat:     Pharynx: No oropharyngeal exudate.  Eyes:     Conjunctiva/sclera: Conjunctivae normal.     Pupils: Pupils are equal, round, and reactive to light.  Neck:     Musculoskeletal: Normal range of motion and neck supple.     Thyroid: No thyromegaly.     Vascular: No JVD.     Trachea: No  tracheal deviation.  Cardiovascular:     Rate and Rhythm: Normal rate.     Pulses:          Dorsalis pedis pulses are 1+ on the right side and 1+ on the left side.       Posterior tibial pulses are 1+ on the right side and 1+ on the left side.     Heart sounds: Murmur present. No friction rub. No gallop.      Comments: Irregular heart rhythm.  Pulmonary:     Effort: Pulmonary effort is normal. No respiratory distress.     Breath sounds: Normal breath sounds. No wheezing or rales.  Chest:     Chest wall: No tenderness.  Abdominal:     General: Bowel sounds are normal.  Palpations: Abdomen is soft.     Tenderness: There is no abdominal tenderness.  Musculoskeletal: Normal range of motion.     Right foot: Normal range of motion. No deformity.     Left foot: Normal range of motion. No deformity.     Comments: Left leg tender from the ankle all the way to the left hip. Hurts to walk. Using a cane and assistance from her daughter to walk.   Feet:     Right foot:     Protective Sensation: 10 sites tested. 10 sites sensed.     Skin integrity: No erythema or warmth.     Left foot:     Protective Sensation: 10 sites tested. 10 sites sensed.     Skin integrity: No erythema or warmth.  Lymphadenopathy:     Cervical: No cervical adenopathy.  Skin:    General: Skin is warm and dry.     Capillary Refill: Capillary refill takes 2 to 3 seconds.     Findings: Erythema and rash present.     Comments: Significant redness, swelling, and warmth t the left lower extremity. Open lesions, present on the left leg have healed.   Neurological:     Mental Status: She is alert and oriented to person, place, and time.     Cranial Nerves: No cranial nerve deficit.  Psychiatric:        Behavior: Behavior normal.        Thought Content: Thought content normal.        Judgment: Judgment normal.   Assessment/Plan:  1. Edema of left lower extremity History of blood clots. Currently on warfarin treatment  due to atrial fibrillation. Will get stat ultrasound of left lower extremity. If positive for clot, will STAT refer to vascular specialist for further evaluation and treatment. If negative, will refer to vascular specialist for further evaluation of venous reflux and possible phlebitis.  - VAS [place] LOWER EXTREMITY VENOUS (DVT); Future  2. Cellulitis of left lower leg On last day of 14 day treatment of doxycycline. If STAT ultrasound negative, will treat with 10 day course of levaquin. Consider referral to dermatology.  3. Atrial fibrillation, unspecified type ([place]) Continue warfarin treatment as discussed.  General Counseling: Monai verbalizes understanding of the findings of todays visit and agrees with plan of treatment. I have discussed any further diagnostic evaluation that may be needed or ordered today. We also reviewed her medications today. she has been encouraged to call the office with any questions or concerns that should arise related to todays visit.    Counseling:  This patient was seen by Leretha Pol FNP Collaboration with Dr Lavera Guise as a part of collaborative care agreement  Time spent: 25 Minutes

## 2018-11-14 ENCOUNTER — Ambulatory Visit: Payer: Medicare Other

## 2018-11-14 DIAGNOSIS — I4821 Permanent atrial fibrillation: Secondary | ICD-10-CM | POA: Diagnosis not present

## 2018-11-14 DIAGNOSIS — I480 Paroxysmal atrial fibrillation: Secondary | ICD-10-CM | POA: Diagnosis not present

## 2018-11-16 ENCOUNTER — Encounter (INDEPENDENT_AMBULATORY_CARE_PROVIDER_SITE_OTHER): Payer: Self-pay | Admitting: Nurse Practitioner

## 2018-11-16 ENCOUNTER — Ambulatory Visit (INDEPENDENT_AMBULATORY_CARE_PROVIDER_SITE_OTHER): Payer: Medicare Other | Admitting: Nurse Practitioner

## 2018-11-16 ENCOUNTER — Ambulatory Visit: Payer: Medicare Other

## 2018-11-16 ENCOUNTER — Ambulatory Visit (INDEPENDENT_AMBULATORY_CARE_PROVIDER_SITE_OTHER): Payer: Medicare Other

## 2018-11-16 VITALS — BP 137/66 | HR 75 | Resp 18 | Ht 64.0 in | Wt 158.0 lb

## 2018-11-16 DIAGNOSIS — J209 Acute bronchitis, unspecified: Secondary | ICD-10-CM | POA: Diagnosis not present

## 2018-11-16 DIAGNOSIS — I10 Essential (primary) hypertension: Secondary | ICD-10-CM

## 2018-11-16 DIAGNOSIS — M79605 Pain in left leg: Secondary | ICD-10-CM

## 2018-11-16 DIAGNOSIS — I82412 Acute embolism and thrombosis of left femoral vein: Secondary | ICD-10-CM

## 2018-11-16 DIAGNOSIS — I4891 Unspecified atrial fibrillation: Secondary | ICD-10-CM

## 2018-11-16 DIAGNOSIS — J440 Chronic obstructive pulmonary disease with acute lower respiratory infection: Secondary | ICD-10-CM

## 2018-11-16 DIAGNOSIS — Z87891 Personal history of nicotine dependence: Secondary | ICD-10-CM | POA: Diagnosis not present

## 2018-11-16 NOTE — Progress Notes (Signed)
PT ADVISED  AT VISIT FOR INR DONE AT HOME

## 2018-11-16 NOTE — Progress Notes (Signed)
Subjective:    Patient ID: Monica [name], female    DOB: 10/17/1926, 85 y.o.   MRN: [id number] Chief Complaint  Patient presents with  . Follow-up    HPI  Monica [name] is a 85 y.o. female that presents for evaluation due to new DVT of the common femoral vein and femoral vein.  Ultrasound done at [facility] indicates probable acute on chronic DVT of the common femoral vein as well as femoral vein left lower extremity. The patient was on Coumadin when she was diagnosed with this DVT.  However, her latest INR was 1.6.  Per the patient and her daughter she will get out of range when she is on antibiotics.  She states that she was on a regimen of antibiotics before this DVT occurred.  She also states that she does home monitoring and will sometimes self adjust her regimen and this is known by the primary care provider.  At this time she denies any chest pain or shortness of breath.  She denies any fever, chills, nausea, vomiting or diarrhea.  She has no changes consistent with cellulitis.  She endorses having pain of her left lower extremity.  With further probing it is found that this pain and discomfort has been going on for years prior to the DVT.  She actually has a history of cancerous growth on her left lower extremity.  She states that this leg is painful when she lays flat as well as sometimes when she is walking.  Past Medical History:  Diagnosis Date  . Atrial fibrillation ([place])   . Bronchitis   . Cancer ([place])    Left leg growth, kidneys, lungs and breasts  . Carcinoma of unknown primary ([place])   . COPD (chronic obstructive pulmonary disease) ([place])   . Diabetes mellitus without complication ([place])   . Hyperlipidemia   . Hypertension   . Pancreatitis   . Pneumonia   . Stroke ([place])    TIA's  . Vitamin B12 deficiency     Past Surgical History:  Procedure Laterality Date  . ABDOMINAL HYSTERECTOMY  1975  . BLADDER SUSPENSION  1989  . COLONOSCOPY    .  COLONOSCOPY WITH PROPOFOL N/A 01/25/2018   Procedure: COLONOSCOPY WITH PROPOFOL;  Surgeon: [name], MD;  Location: [facility] ENDOSCOPY;  Service: Endoscopy;  Laterality: N/A;  . ESOPHAGOGASTRODUODENOSCOPY (EGD) WITH PROPOFOL N/A 01/24/2018   Procedure: ESOPHAGOGASTRODUODENOSCOPY (EGD) WITH PROPOFOL;  Surgeon: [name], MD;  Location: [facility] ENDOSCOPY;  Service: Endoscopy;  Laterality: N/A;    Social History   Socioeconomic History  . Marital status: Divorced    Spouse name: Not on file  . Number of children: Not on file  . Years of education: Not on file  . Highest education level: Not on file  Occupational History  . Not on file  Social Needs  . Financial resource strain: Not on file  . Food insecurity:    Worry: Not on file    Inability: Not on file  . Transportation needs:    Medical: Not on file    Non-medical: Not on file  Tobacco Use  . Smoking status: Former Smoker    Packs/day: 0.50    Years: 15.00    Pack years: 7.50    Types: Cigarettes    Last attempt to quit: 11/15/1988    Years since quitting: 30.0  . Smokeless tobacco: Never Used  Substance and Sexual Activity  . Alcohol use: No    Alcohol/week: 0.0  standard drinks  . Drug use: No  . Sexual activity: Not on file    Comment: hysterectomy   Lifestyle  . Physical activity:    Days per week: Not on file    Minutes per session: Not on file  . Stress: Not on file  Relationships  . Social connections:    Talks on phone: Not on file    Gets together: Not on file    Attends religious service: Not on file    Active member of club or organization: Not on file    Attends meetings of clubs or organizations: Not on file    Relationship status: Not on file  . Intimate partner violence:    Fear of current or ex partner: Not on file    Emotionally abused: Not on file    Physically abused: Not on file    Forced sexual activity: Not on file  Other Topics Concern  . Not on file  Social History Narrative  . Not on  file    Family History  Problem Relation Age of Onset  . Cancer Father   . Diabetes Brother     Allergies  Allergen Reactions  . Sulfa Antibiotics Swelling  . Celecoxib Nausea And Vomiting  . Acetaminophen Itching  . Codeine Rash  . Lyrica [Pregabalin] Rash  . Penicillin G Rash    Has patient had a PCN reaction causing immediate rash, facial/tongue/throat swelling, SOB or lightheadedness with hypotension: Yes Has patient had a PCN reaction causing severe rash involving mucus membranes or skin necrosis: No Has patient had a PCN reaction that required hospitalization: No Has patient had a PCN reaction occurring within the last 10 years: Unknown If all of the above answers are "NO", then may proceed with Cephalosporin use.  Marland Kitchen Petrolatum-Zinc Oxide Rash     Review of Systems   Review of Systems: Negative Unless Checked Constitutional: [] Weight loss  [] Fever  [] Chills Cardiac: [] Chest pain   []  Atrial Fibrillation  [] Palpitations   [] Shortness of breath when laying flat   [] Shortness of breath with exertion. [] Shortness of breath at rest Vascular:  [x] Pain in legs with walking   [] Pain in legs with standing [x] Pain in legs when laying flat   [] Claudication    [] Pain in feet when laying flat    [x] History of DVT   [] Phlebitis   [x] Swelling in legs   [] Varicose veins   [] Non-healing ulcers Pulmonary:   [] Uses home oxygen   [] Productive cough   [] Hemoptysis   [] Wheeze  [x] COPD   [] Asthma Neurologic:  [] Dizziness   [] Seizures  [] Blackouts [] History of stroke   [] History of TIA  [] Aphasia   [] Temporary Blindness   [] Weakness or numbness in arm   [x] Weakness or numbness in leg Musculoskeletal:   [] Joint swelling   [] Joint pain   [x] Low back pain  []  History of Knee Replacement [] Arthritis [] back Surgeries  [x]  Spinal Stenosis    Hematologic:  [] Easy bruising  [] Easy bleeding   [] Hypercoagulable state   [x] Anemic Gastrointestinal:  [] Diarrhea   [] Vomiting  [] Gastroesophageal reflux/heartburn    [] Difficulty swallowing. [] Abdominal pain Genitourinary:  [] Chronic kidney disease   [] Difficult urination  [] Anuric   [] Blood in urine [] Frequent urination  [] Burning with urination   [] Hematuria Skin:  [] Rashes   [] Ulcers [] Wounds Psychological:  [] History of anxiety   []  History of major depression  []  Memory Difficulties     Objective:   Physical Exam  BP 137/66 (BP Location: Left Arm, Patient Position: Sitting)  Pulse 75   Resp 18   Ht 5\' 4"  (1.626 m)   Wt 158 lb (71.7 kg)   BMI 27.12 kg/m   Gen: WD/WN, NAD Head: [place]/AT, No temporalis wasting.  Ear/Nose/Throat: Hearing grossly intact, nares w/o erythema or drainage Eyes: PER, EOMI, sclera nonicteric.  Neck: Supple, no masses.  No JVD.  Pulmonary:  Good air movement, no use of accessory muscles.  Cardiac: RRR Vascular:  2+ pitting edema on left lower extremity.  Scattered varicose veins. Vessel Right Left  Radial Palpable Palpable  Dorsalis Pedis  not palpable  trace palpable  Posterior Tibial  not palpable  not palpable   Gastrointestinal: soft, non-distended. No guarding/no peritoneal signs.  Musculoskeletal: M/S 5/5 throughout.  No deformity or atrophy.  Neurologic: Pain and light touch intact in extremities.  Symmetrical.  Speech is fluent. Motor exam as listed above. Psychiatric: Judgment intact, Mood & affect appropriate for pt's clinical situation. Dermatologic: No Venous rashes. No Ulcers Noted.  No changes consistent with cellulitis. Lymph : No Cervical lymphadenopathy, no lichenification or skin changes of chronic lymphedema.      Assessment & Plan:   1. Acute deep vein thrombosis (DVT) of femoral vein of left lower extremity (HCC) Recommend:   After thorough discussion and consideration with the patient and her family, no surgery or intervention at this point in time.  IVC filter is not indicated at present.  Patient's duplex ultrasound of the venous system shows DVT from the probable acute on chronic  DVT of the common femoral vein as well as femoral vein left lower extremity.  The patient will remain on Coumadin with instructions to closely adhere to her primary care provider's changes.  She needs to ensure that she is maintaining an INR between 2 and 3.  Elevation was stressed, use of a recliner was discussed.  I have had a long discussion with the patient regarding DVT and post phlebitic changes such as swelling and why it  causes symptoms such as pain.  The patient will wear graduated compression stockings class 1 (20-30 mmHg), beginning after three full days of anticoagulation, on a daily basis a prescription was given. The patient will  beginning wearing the stockings first thing in the morning and removing them in the evening. The patient is instructed specifically not to sleep in the stockings.  In addition, behavioral modification including elevation during the day and avoidance of prolonged dependency will be initiated.    The patient will continue anticoagulation for now as there have not been any problems or complications at this point.   - VAS [place] LOWER EXTREMITY VENOUS (DVT); Future  2. Essential hypertension Continue antihypertensive medications as already ordered, these medications have been reviewed and there are no changes at this time.   3. COPD (chronic obstructive pulmonary disease) with acute bronchitis (HCC) Continue pulmonary medications and aerosols as already ordered, these medications have been reviewed and there are no changes at this time.   4. Lower extremity pain, diffuse, left The patient has had some longstanding pain issues in her left lower extremity.  Pulses are hard to palpate however this could be due to atherosclerotic changes.  In order to ensure that there are no major peripheral vascular changes, we will obtain bilateral ABIs also on her upcoming visit. - VAS [place] ABI WITH/WO TBI; Future   Current Outpatient Medications on File Prior to Visit    Medication Sig Dispense Refill  . albuterol (PROVENTIL HFA;VENTOLIN HFA) 108 (90 Base) MCG/ACT inhaler Inhale  1 puff into the lungs every 4 (four) hours as needed for wheezing or shortness of breath.    Marland Kitchen amLODipine-benazepril (LOTREL) 5-20 MG capsule TAKE 1 CAPSULE BY MOUTH EVERY DAY 90 capsule 3  . b complex vitamins capsule Take 1 capsule by mouth daily.    Marland Kitchen BREO ELLIPTA 100-25 MCG/INH AEPB TAKE 1 PUFF ONCE A DAY 60 each 3  . desloratadine (CLARINEX) 5 MG tablet Take 1 tablet (5 mg total) by mouth daily. 30 tablet 3  . doxycycline (VIBRAMYCIN) 100 MG capsule Take 1 capsule (100 mg total) by mouth 2 (two) times daily. 28 capsule 0  . famotidine (PEPCID) 20 MG tablet Take 1 tablet (20 mg total) by mouth 2 (two) times daily. 30 tablet 0  . furosemide (LASIX) 20 MG tablet Take 20 mg by mouth daily.     Marland Kitchen gabapentin (NEURONTIN) 300 MG capsule Take 2 capsules (600 mg total) by mouth at bedtime. 60 capsule 5  . glipiZIDE (GLUCOTROL XL) 2.5 MG 24 hr tablet Take 1 tablet (2.5 mg total) by mouth daily. 90 tablet 1  . glucose blood test strip 1 each by Other route as needed for other. Use as instructed 100 each 1  . hydrochlorothiazide (HYDRODIURIL) 25 MG tablet Take 1 tablet (25 mg total) by mouth daily. 30 tablet 5  . metFORMIN (GLUCOPHAGE) 500 MG tablet TAKE ONE TABLET BY MOUTH TWICE DAILY (Patient taking differently: take 750mg  daily) 180 tablet 0  . metFORMIN (GLUCOPHAGE-XR) 500 MG 24 hr tablet TAKE 1 AND 1/2 TABLETS DAILY 135 tablet 2  . montelukast (SINGULAIR) 10 MG tablet Take 10 mg by mouth at bedtime.    Marland Kitchen oxybutynin (DITROPAN-XL) 10 MG 24 hr tablet Take 1 tablet (10 mg total) by mouth daily. 30 tablet 5  . pravastatin (PRAVACHOL) 40 MG tablet TAKE 1 TABLET DAILY 90 tablet 1  . predniSONE (STERAPRED UNI-PAK 48 TAB) 10 MG (48) TBPK tablet 12 day taper - take by mouth as directed for 12 days 48 tablet 0  . PROAIR HFA 108 (90 Base) MCG/ACT inhaler TAKE 1 PUFF EVERY 4 HOURS AS NEEDED 25.5 g 1  .  theophylline (THEO-24) 100 MG 24 hr capsule TAKE 1 CAPSULE BY MOUTH EVERY DAY 30 capsule 3  . triamcinolone (KENALOG) 0.025 % cream Apply 1 application topically 2 (two) times daily. 80 g 2  . Umeclidinium-Vilanterol (ANORO ELLIPTA) 62.5-25 MCG/INH AEPB Inhale 1 puff into the lungs daily.     Marland Kitchen warfarin (COUMADIN) 4 MG tablet TAKE ONE TABLET EVERY DAY EXCEPT MONDAY TAKE 1/2 TABLET 30 tablet 5   No current facility-administered medications on file prior to visit.     There are no Patient Instructions on file for this visit. No follow-ups on file.   Kris Hartmann, NP  This note was completed with Sales executive.  Any errors are purely unintentional.

## 2018-11-20 ENCOUNTER — Ambulatory Visit (INDEPENDENT_AMBULATORY_CARE_PROVIDER_SITE_OTHER): Payer: Medicare Other

## 2018-11-20 ENCOUNTER — Encounter (INDEPENDENT_AMBULATORY_CARE_PROVIDER_SITE_OTHER): Payer: Self-pay | Admitting: Nurse Practitioner

## 2018-11-20 DIAGNOSIS — I4891 Unspecified atrial fibrillation: Secondary | ICD-10-CM | POA: Diagnosis not present

## 2018-11-20 LAB — PROTIME-INR: INR: 3.6 — AB (ref 0.9–1.1)

## 2018-11-21 NOTE — Progress Notes (Signed)
PT INR 3.6 CONTINUE CURRENT  DOSING RECHECK IN 1 WEEK AS PER HEATHER

## 2018-11-22 ENCOUNTER — Encounter: Payer: Self-pay | Admitting: Nurse Practitioner

## 2018-11-22 LAB — PROTIME-INR
INR: 1.6 — AB (ref 0.9–1.1)
INR: 3.3 — AB (ref 0.9–1.1)

## 2018-11-27 ENCOUNTER — Other Ambulatory Visit: Payer: Self-pay

## 2018-11-27 DIAGNOSIS — L239 Allergic contact dermatitis, unspecified cause: Secondary | ICD-10-CM

## 2018-11-27 DIAGNOSIS — L209 Atopic dermatitis, unspecified: Secondary | ICD-10-CM

## 2018-11-27 DIAGNOSIS — I10 Essential (primary) hypertension: Secondary | ICD-10-CM

## 2018-11-27 MED ORDER — DESLORATADINE 5 MG PO TABS: 5.0000 mg | ORAL_TABLET | Freq: Every day | ORAL | 3 refills | Status: DC

## 2018-11-27 MED ORDER — HYDROCHLOROTHIAZIDE 25 MG PO TABS: 25.0000 mg | ORAL_TABLET | Freq: Every day | ORAL | 5 refills | Status: DC

## 2018-11-27 MED ORDER — GLIPIZIDE ER 2.5 MG PO TB24: 2.5000 mg | ORAL_TABLET | Freq: Every day | ORAL | 1 refills | Status: DC

## 2018-11-28 LAB — PROTIME-INR: INR: 1.7 — AB (ref 0.9–1.1)

## 2018-11-29 ENCOUNTER — Ambulatory Visit (INDEPENDENT_AMBULATORY_CARE_PROVIDER_SITE_OTHER): Payer: Medicare Other

## 2018-11-29 DIAGNOSIS — I8290 Acute embolism and thrombosis of unspecified vein: Secondary | ICD-10-CM | POA: Diagnosis not present

## 2018-11-29 DIAGNOSIS — I82409 Acute embolism and thrombosis of unspecified deep veins of unspecified lower extremity: Secondary | ICD-10-CM | POA: Insufficient documentation

## 2018-11-29 NOTE — Progress Notes (Signed)
Pt INr 1.7 as per heather pt advised that take 2 tonight and then back to normal routine

## 2018-12-05 LAB — PROTIME-INR: INR: 2.9 — AB (ref 0.9–1.1)

## 2018-12-06 ENCOUNTER — Ambulatory Visit (INDEPENDENT_AMBULATORY_CARE_PROVIDER_SITE_OTHER): Payer: Medicare Other

## 2018-12-06 DIAGNOSIS — I8290 Acute embolism and thrombosis of unspecified vein: Secondary | ICD-10-CM

## 2018-12-07 NOTE — Progress Notes (Signed)
Pt INR 2.9 continue same med

## 2018-12-13 ENCOUNTER — Ambulatory Visit (INDEPENDENT_AMBULATORY_CARE_PROVIDER_SITE_OTHER): Payer: Medicare Other

## 2018-12-13 DIAGNOSIS — I4891 Unspecified atrial fibrillation: Secondary | ICD-10-CM

## 2018-12-13 DIAGNOSIS — I480 Paroxysmal atrial fibrillation: Secondary | ICD-10-CM | POA: Diagnosis not present

## 2018-12-13 DIAGNOSIS — I4821 Permanent atrial fibrillation: Secondary | ICD-10-CM | POA: Diagnosis not present

## 2018-12-20 ENCOUNTER — Ambulatory Visit: Payer: Medicare Other

## 2018-12-21 ENCOUNTER — Encounter: Payer: Self-pay | Admitting: Nurse Practitioner

## 2018-12-21 ENCOUNTER — Ambulatory Visit (INDEPENDENT_AMBULATORY_CARE_PROVIDER_SITE_OTHER): Payer: Medicare Other

## 2018-12-21 DIAGNOSIS — I4891 Unspecified atrial fibrillation: Secondary | ICD-10-CM | POA: Diagnosis not present

## 2018-12-21 LAB — POCT INR: INR: 2.4 (ref 2.0–3.0)

## 2018-12-21 NOTE — Progress Notes (Signed)
Pt home INR 2.4 on 12/20/2018. Per Heather, INR is good pt, continue therapy as prescribed.

## 2018-12-25 ENCOUNTER — Encounter: Payer: Self-pay | Admitting: Nurse Practitioner

## 2018-12-26 ENCOUNTER — Ambulatory Visit (INDEPENDENT_AMBULATORY_CARE_PROVIDER_SITE_OTHER): Payer: Medicare Other

## 2018-12-26 DIAGNOSIS — Z86718 Personal history of other venous thrombosis and embolism: Secondary | ICD-10-CM

## 2018-12-26 NOTE — Progress Notes (Signed)
Pt INR 2.2 continue same med as per adam

## 2018-12-26 NOTE — Progress Notes (Signed)
Pt INR 2.5 continue same med as per adam

## 2018-12-27 ENCOUNTER — Ambulatory Visit: Payer: Medicare Other

## 2019-01-01 ENCOUNTER — Other Ambulatory Visit: Payer: Self-pay

## 2019-01-01 MED ORDER — THEOPHYLLINE ER 100 MG PO CP24: ORAL_CAPSULE | ORAL | 3 refills | Status: DC

## 2019-01-04 ENCOUNTER — Ambulatory Visit (INDEPENDENT_AMBULATORY_CARE_PROVIDER_SITE_OTHER): Payer: Medicare Other

## 2019-01-04 DIAGNOSIS — I8290 Acute embolism and thrombosis of unspecified vein: Secondary | ICD-10-CM

## 2019-01-08 NOTE — Progress Notes (Signed)
Pt Inr 2.9 continue same as per [employer]

## 2019-01-10 ENCOUNTER — Ambulatory Visit (INDEPENDENT_AMBULATORY_CARE_PROVIDER_SITE_OTHER): Payer: Medicare Other

## 2019-01-10 DIAGNOSIS — I4891 Unspecified atrial fibrillation: Secondary | ICD-10-CM

## 2019-01-10 DIAGNOSIS — I480 Paroxysmal atrial fibrillation: Secondary | ICD-10-CM | POA: Diagnosis not present

## 2019-01-10 DIAGNOSIS — I4821 Permanent atrial fibrillation: Secondary | ICD-10-CM | POA: Diagnosis not present

## 2019-01-10 NOTE — Progress Notes (Signed)
Pt  INR 3.0  Continue same med as per [employer]

## 2019-01-16 ENCOUNTER — Encounter: Payer: Self-pay | Admitting: Nurse Practitioner

## 2019-01-16 ENCOUNTER — Ambulatory Visit (INDEPENDENT_AMBULATORY_CARE_PROVIDER_SITE_OTHER): Payer: Medicare Other

## 2019-01-16 DIAGNOSIS — I4891 Unspecified atrial fibrillation: Secondary | ICD-10-CM

## 2019-01-16 LAB — PROTIME-INR
INR: 2.4 — AB (ref 0.9–1.1)
INR: 2.9 — AB (ref 0.9–1.1)
INR: 2.2 — AB (ref 0.9–1.1)
INR: 2.5 — AB (ref 0.9–1.1)
INR: 3 — AB (ref 0.9–1.1)

## 2019-01-16 NOTE — Progress Notes (Signed)
Pt Inr 1.5 as per heather advised Pt take extra coumadin tonight and go back regular and repeat in 1 week

## 2019-01-22 ENCOUNTER — Other Ambulatory Visit: Payer: Self-pay

## 2019-01-22 ENCOUNTER — Ambulatory Visit (INDEPENDENT_AMBULATORY_CARE_PROVIDER_SITE_OTHER): Payer: Medicare Other

## 2019-01-22 DIAGNOSIS — R350 Frequency of micturition: Secondary | ICD-10-CM

## 2019-01-22 DIAGNOSIS — I4891 Unspecified atrial fibrillation: Secondary | ICD-10-CM

## 2019-01-22 MED ORDER — PRAVASTATIN SODIUM 40 MG PO TABS: 40.0000 mg | ORAL_TABLET | Freq: Every day | ORAL | 1 refills | Status: DC

## 2019-01-22 MED ORDER — OXYBUTYNIN CHLORIDE ER 10 MG PO TB24: 10.0000 mg | ORAL_TABLET | Freq: Every day | ORAL | 5 refills | Status: DC

## 2019-01-22 MED ORDER — METFORMIN HCL ER 500 MG PO TB24: 750.0000 mg | ORAL_TABLET | Freq: Every day | ORAL | 2 refills | Status: DC

## 2019-01-22 NOTE — Progress Notes (Signed)
Pt inr 2.1 continue same med as per [employer]

## 2019-01-30 ENCOUNTER — Other Ambulatory Visit: Payer: Self-pay | Admitting: Internal Medicine

## 2019-01-30 ENCOUNTER — Ambulatory Visit (INDEPENDENT_AMBULATORY_CARE_PROVIDER_SITE_OTHER): Payer: Medicare Other

## 2019-01-30 DIAGNOSIS — I4891 Unspecified atrial fibrillation: Secondary | ICD-10-CM | POA: Diagnosis not present

## 2019-01-30 NOTE — Progress Notes (Signed)
Pt Inr 2.8 continue same med

## 2019-02-05 DIAGNOSIS — I480 Paroxysmal atrial fibrillation: Secondary | ICD-10-CM | POA: Diagnosis not present

## 2019-02-05 DIAGNOSIS — I4821 Permanent atrial fibrillation: Secondary | ICD-10-CM | POA: Diagnosis not present

## 2019-02-07 ENCOUNTER — Ambulatory Visit: Payer: Medicare Other

## 2019-02-07 ENCOUNTER — Other Ambulatory Visit: Payer: Self-pay

## 2019-02-07 DIAGNOSIS — I4891 Unspecified atrial fibrillation: Secondary | ICD-10-CM

## 2019-02-09 LAB — POCT INR: INR: 1.9 — AB (ref 2.0–3.0)

## 2019-02-09 NOTE — Progress Notes (Signed)
Per Nira Conn pt has already adjusted dosing , recheck one week

## 2019-02-13 ENCOUNTER — Other Ambulatory Visit: Payer: Self-pay

## 2019-02-13 ENCOUNTER — Ambulatory Visit (INDEPENDENT_AMBULATORY_CARE_PROVIDER_SITE_OTHER): Payer: Medicare Other

## 2019-02-13 DIAGNOSIS — I4891 Unspecified atrial fibrillation: Secondary | ICD-10-CM

## 2019-02-13 NOTE — Progress Notes (Signed)
Pt Inr 2.8 as per  Nira Conn continue same med

## 2019-02-14 ENCOUNTER — Encounter: Payer: Self-pay | Admitting: Nurse Practitioner

## 2019-02-14 LAB — PROTIME-INR
INR: 1.5 — AB (ref 0.9–1.1)
INR: 1.9 — AB (ref 0.9–1.1)
INR: 2.1 — AB (ref 0.9–1.1)
INR: 2.8 — AB (ref 0.9–1.1)
INR: 2.8 — AB (ref 0.9–1.1)

## 2019-02-16 ENCOUNTER — Other Ambulatory Visit: Payer: Self-pay | Admitting: Nurse Practitioner

## 2019-02-16 DIAGNOSIS — L239 Allergic contact dermatitis, unspecified cause: Secondary | ICD-10-CM

## 2019-02-16 DIAGNOSIS — L209 Atopic dermatitis, unspecified: Secondary | ICD-10-CM

## 2019-02-16 DIAGNOSIS — I4891 Unspecified atrial fibrillation: Secondary | ICD-10-CM

## 2019-02-16 MED ORDER — GABAPENTIN 300 MG PO CAPS: 600.0000 mg | ORAL_CAPSULE | Freq: Every day | ORAL | 5 refills | Status: DC

## 2019-02-16 MED ORDER — DESLORATADINE 5 MG PO TABS: 5.0000 mg | ORAL_TABLET | Freq: Every day | ORAL | 3 refills | Status: DC

## 2019-02-16 MED ORDER — AMLODIPINE BESY-BENAZEPRIL HCL 5-20 MG PO CAPS: ORAL_CAPSULE | ORAL | 3 refills | Status: DC

## 2019-02-16 MED ORDER — WARFARIN SODIUM 4 MG PO TABS: ORAL_TABLET | ORAL | 5 refills | Status: DC

## 2019-02-19 ENCOUNTER — Ambulatory Visit (INDEPENDENT_AMBULATORY_CARE_PROVIDER_SITE_OTHER): Payer: Medicare Other | Admitting: Vascular Surgery

## 2019-02-19 ENCOUNTER — Encounter (INDEPENDENT_AMBULATORY_CARE_PROVIDER_SITE_OTHER): Payer: Medicare Other

## 2019-02-27 ENCOUNTER — Ambulatory Visit (INDEPENDENT_AMBULATORY_CARE_PROVIDER_SITE_OTHER): Payer: Medicare Other

## 2019-02-27 DIAGNOSIS — I4891 Unspecified atrial fibrillation: Secondary | ICD-10-CM

## 2019-02-27 NOTE — Progress Notes (Signed)
PT INR 2.9 CONTINUE SAME MED

## 2019-03-06 ENCOUNTER — Other Ambulatory Visit: Payer: Self-pay

## 2019-03-06 ENCOUNTER — Encounter: Payer: Self-pay | Admitting: Nurse Practitioner

## 2019-03-06 ENCOUNTER — Ambulatory Visit (INDEPENDENT_AMBULATORY_CARE_PROVIDER_SITE_OTHER): Payer: Medicare Other | Admitting: Nurse Practitioner

## 2019-03-06 ENCOUNTER — Telehealth: Payer: Self-pay

## 2019-03-06 VITALS — Ht 64.0 in | Wt 158.0 lb

## 2019-03-06 DIAGNOSIS — I4821 Permanent atrial fibrillation: Secondary | ICD-10-CM | POA: Diagnosis not present

## 2019-03-06 DIAGNOSIS — J440 Chronic obstructive pulmonary disease with acute lower respiratory infection: Secondary | ICD-10-CM

## 2019-03-06 DIAGNOSIS — L209 Atopic dermatitis, unspecified: Secondary | ICD-10-CM | POA: Diagnosis not present

## 2019-03-06 DIAGNOSIS — N390 Urinary tract infection, site not specified: Secondary | ICD-10-CM

## 2019-03-06 DIAGNOSIS — J209 Acute bronchitis, unspecified: Secondary | ICD-10-CM | POA: Diagnosis not present

## 2019-03-06 DIAGNOSIS — E1165 Type 2 diabetes mellitus with hyperglycemia: Secondary | ICD-10-CM

## 2019-03-06 DIAGNOSIS — I480 Paroxysmal atrial fibrillation: Secondary | ICD-10-CM | POA: Diagnosis not present

## 2019-03-06 MED ORDER — ALBUTEROL SULFATE HFA 108 (90 BASE) MCG/ACT IN AERS: 2.0000 | INHALATION_SPRAY | RESPIRATORY_TRACT | 5 refills | Status: DC | PRN

## 2019-03-06 MED ORDER — TRIAMCINOLONE ACETONIDE 0.5 % EX OINT: 1.0000 "application " | TOPICAL_OINTMENT | Freq: Two times a day (BID) | CUTANEOUS | 3 refills | Status: DC

## 2019-03-06 MED ORDER — PREDNISONE 10 MG (48) PO TBPK: ORAL_TABLET | ORAL | 0 refills | Status: DC

## 2019-03-06 MED ORDER — CIPROFLOXACIN HCL 500 MG PO TABS: 500.0000 mg | ORAL_TABLET | Freq: Two times a day (BID) | ORAL | 0 refills | Status: DC

## 2019-03-06 MED ORDER — DOXYCYCLINE HYCLATE 100 MG PO CAPS: 100.0000 mg | ORAL_CAPSULE | Freq: Two times a day (BID) | ORAL | 0 refills | Status: AC

## 2019-03-06 NOTE — Progress Notes (Signed)
[facility] [place], [place] [address]  Internal MEDICINE  Telephone Visit  Patient Name: Monica [name]  [id number]  [id number]  Date of Service: 03/06/2019  I connected with the patient at 2:16pm by telephone and verified the patients identity using two identifiers.   I discussed the limitations, risks, security and privacy concerns of performing an evaluation and management service by telephone and the availability of in person appointments. I also discussed with the patient that there may be a patient responsible charge related to the service.  The patient expressed understanding and agrees to proceed.    Chief Complaint  Patient presents with  . Telephone Screen  . Diabetes    4 month follow up   . Pruritis    whole body has been itching   . Urinary Tract Infection    been having burning , increased liquid intake  . Hyperlipidemia  . Hypertension  . Telephone Assessment    The patient has been contacted via telephone for follow up visit due to concerns for spread of novel coronavirus.  The patient states that she has had "itching and coughing" for past several days. Happens when the pollen starts to get increased. States that her itching has been so severe, she has scratched her skin open in places. Wheezing has been a bit worse as well.  She states that she is also starting to have some burning and pain when she urinates. Has been going on for past several days. She has increased her fluid intake and is drinking more water and cranberry juice. Generally, she has to take ciprofloxacin 500mg  twice daily for 14 days.       Current Medication: Outpatient Encounter Medications as of 03/06/2019  Medication Sig Note  . albuterol (VENTOLIN HFA) 108 (90 Base) MCG/ACT inhaler Inhale 2 puffs into the lungs every 4 (four) hours as needed for wheezing or shortness of breath.   Marland Kitchen amLODipine-benazepril (LOTREL) 5-20 MG capsule TAKE 1 CAPSULE BY MOUTH EVERY DAY   .  b complex vitamins capsule Take 1 capsule by mouth daily.   Marland Kitchen BREO ELLIPTA 100-25 MCG/INH AEPB TAKE 1 PUFF ONCE A DAY   . desloratadine (CLARINEX) 5 MG tablet Take 1 tablet (5 mg total) by mouth daily.   . famotidine (PEPCID) 20 MG tablet Take 1 tablet (20 mg total) by mouth 2 (two) times daily.   . furosemide (LASIX) 20 MG tablet Take 20 mg by mouth daily.    Marland Kitchen gabapentin (NEURONTIN) 300 MG capsule Take 2 capsules (600 mg total) by mouth at bedtime.   Marland Kitchen glipiZIDE (GLUCOTROL XL) 2.5 MG 24 hr tablet Take 1 tablet (2.5 mg total) by mouth daily.   . hydrochlorothiazide (HYDRODIURIL) 25 MG tablet Take 1 tablet (25 mg total) by mouth daily.   . Liniments (SALONPAS PAIN RELIEF PATCH EX) Apply topically.   . metFORMIN (GLUCOPHAGE-XR) 500 MG 24 hr tablet Take 2 tablets (1,000 mg total) by mouth daily.   . montelukast (SINGULAIR) 10 MG tablet Take 10 mg by mouth at bedtime.   . ONE TOUCH ULTRA TEST test strip TEST ONCE A DAY AS DIRECTED   . oxybutynin (DITROPAN-XL) 10 MG 24 hr tablet Take 1 tablet (10 mg total) by mouth daily.   . pravastatin (PRAVACHOL) 40 MG tablet Take 1 tablet (40 mg total) by mouth daily.   . predniSONE (STERAPRED UNI-PAK 48 TAB) 10 MG (48) TBPK tablet 12 day taper - take by mouth as directed for 12  days   . theophylline (THEO-24) 100 MG 24 hr capsule TAKE 1 CAPSULE BY MOUTH EVERY DAY   . Umeclidinium-Vilanterol (ANORO ELLIPTA) 62.5-25 MCG/INH AEPB Inhale 1 puff into the lungs daily.    Marland Kitchen warfarin (COUMADIN) 4 MG tablet TAKE ONE TABLET EVERY DAY EXCEPT MONDAY TAKE 1/2 TABLET   . [DISCONTINUED] albuterol (PROVENTIL HFA;VENTOLIN HFA) 108 (90 Base) MCG/ACT inhaler Inhale 1 puff into the lungs every 4 (four) hours as needed for wheezing or shortness of breath.   . [DISCONTINUED] doxycycline (VIBRAMYCIN) 100 MG capsule Take 1 capsule (100 mg total) by mouth 2 (two) times daily.   . [DISCONTINUED] predniSONE (STERAPRED UNI-PAK 48 TAB) 10 MG (48) TBPK tablet 12 day taper - take by mouth as  directed for 12 days   . [DISCONTINUED] PROAIR HFA 108 (90 Base) MCG/ACT inhaler TAKE 1 PUFF EVERY 4 HOURS AS NEEDED   . [DISCONTINUED] triamcinolone (KENALOG) 0.025 % cream Apply 1 application topically 2 (two) times daily. 03/06/2019: increased dose  . ciprofloxacin (CIPRO) 500 MG tablet Take 1 tablet (500 mg total) by mouth 2 (two) times daily.   Marland Kitchen triamcinolone ointment (KENALOG) 0.5 % Apply 1 application topically 2 (two) times daily.   . [DISCONTINUED] metFORMIN (GLUCOPHAGE) 500 MG tablet TAKE ONE TABLET BY MOUTH TWICE DAILY (Patient not taking: Reported on 01/22/2019)    No facility-administered encounter medications on file as of 03/06/2019.     Surgical History: Past Surgical History:  Procedure Laterality Date  . ABDOMINAL HYSTERECTOMY  1975  . BLADDER SUSPENSION  1989  . COLONOSCOPY    . COLONOSCOPY WITH PROPOFOL N/A 01/25/2018   Procedure: COLONOSCOPY WITH PROPOFOL;  Surgeon: [name], MD;  Location: [facility] ENDOSCOPY;  Service: Endoscopy;  Laterality: N/A;  . ESOPHAGOGASTRODUODENOSCOPY (EGD) WITH PROPOFOL N/A 01/24/2018   Procedure: ESOPHAGOGASTRODUODENOSCOPY (EGD) WITH PROPOFOL;  Surgeon: [name], MD;  Location: [facility] ENDOSCOPY;  Service: Endoscopy;  Laterality: N/A;    Medical History: Past Medical History:  Diagnosis Date  . Atrial fibrillation ([place])   . Bronchitis   . Cancer ([place])    Left leg growth, kidneys, lungs and breasts  . Carcinoma of unknown primary ([place])   . COPD (chronic obstructive pulmonary disease) ([place])   . Diabetes mellitus without complication ([place])   . Hyperlipidemia   . Hypertension   . Pancreatitis   . Pneumonia   . Stroke ([place])    TIA's  . Vitamin B12 deficiency     Family History: Family History  Problem Relation Age of Onset  . Cancer Father   . Diabetes Brother     Social History   Socioeconomic History  . Marital status: Divorced    Spouse name: Not on file  . Number of children: Not on file  . Years of education: Not on  file  . Highest education level: Not on file  Occupational History  . Not on file  Social Needs  . Financial resource strain: Not on file  . Food insecurity:    Worry: Not on file    Inability: Not on file  . Transportation needs:    Medical: Not on file    Non-medical: Not on file  Tobacco Use  . Smoking status: Former Smoker    Packs/day: 0.50    Years: 15.00    Pack years: 7.50    Types: Cigarettes    Last attempt to quit: 11/15/1988    Years since quitting: 30.3  . Smokeless tobacco: Never Used  Substance and Sexual Activity  .  Alcohol use: No    Alcohol/week: 0.0 standard drinks  . Drug use: No  . Sexual activity: Not on file    Comment: hysterectomy   Lifestyle  . Physical activity:    Days per week: Not on file    Minutes per session: Not on file  . Stress: Not on file  Relationships  . Social connections:    Talks on phone: Not on file    Gets together: Not on file    Attends religious service: Not on file    Active member of club or organization: Not on file    Attends meetings of clubs or organizations: Not on file    Relationship status: Not on file  . Intimate partner violence:    Fear of current or ex partner: Not on file    Emotionally abused: Not on file    Physically abused: Not on file    Forced sexual activity: Not on file  Other Topics Concern  . Not on file  Social History Narrative  . Not on file      Review of Systems  Constitutional: Negative for chills, fatigue and unexpected weight change.  HENT: Negative for congestion, postnasal drip, rhinorrhea, sneezing and sore throat.   Respiratory: Positive for wheezing. Negative for cough, chest tightness and shortness of breath.        Intermittent and well controlled on her current medications.   Cardiovascular: Negative for chest pain and palpitations.  Gastrointestinal: Negative for abdominal pain, constipation, diarrhea, nausea and vomiting.  Endocrine:       States that her blood sugars  have been doing well.   Genitourinary: Positive for dysuria, frequency and urgency.  Musculoskeletal: Negative for arthralgias, back pain, joint swelling and neck pain.  Skin: Positive for rash.       Generalized itchiness. Has become worse when pollen got bad.   Allergic/Immunologic: Positive for environmental allergies.  Neurological: Negative for dizziness, tremors, numbness and headaches.  Hematological: Negative for adenopathy. Does not bruise/bleed easily.  Psychiatric/Behavioral: Negative for behavioral problems (Depression), sleep disturbance and suicidal ideas. The patient is not nervous/anxious.    Today's Vitals   03/06/19 1402  Weight: 158 lb (71.7 kg)  Height: 5\' 4"  (1.626 m)   Body mass index is 27.12 kg/m.  Observation/Objective:  The patient is alert and oriented. She is answering all questions appropriately. She sounds mildly short of breath with exerion. She is, otherwise, in no acute distress.    Assessment/Plan: 1. Urinary tract infection without hematuria, site unspecified Start cipro 500mg  twice daily for 14 days. Continue with increased water and cranberry juice. Notify office if no improvement over next few days  - ciprofloxacin (CIPRO) 500 MG tablet; Take 1 tablet (500 mg total) by mouth 2 (two) times daily.  Dispense: 28 tablet; Refill: 0  2. Atopic dermatitis, unspecified type Prednisone taper - take as directed for 12 days. Increase strength of triamcinolone to 0.5mg  twice daily as needed to help itching and irritation.  - predniSONE (STERAPRED UNI-PAK 48 TAB) 10 MG (48) TBPK tablet; 12 day taper - take by mouth as directed for 12 days  Dispense: 48 tablet; Refill: 0 - triamcinolone ointment (KENALOG) 0.5 %; Apply 1 application topically 2 (two) times daily.  Dispense: 30 g; Refill: 3  3. COPD (chronic obstructive pulmonary disease) with acute bronchitis (H[facility]) Continue to use rescue inhaler as needed and as prescribed. Refills provided today.  -  albuterol (VENTOLIN HFA) 108 (90 Base) MCG/ACT inhaler; Inhale 2  puffs into the lungs every 4 (four) hours as needed for wheezing or shortness of breath.  Dispense: 1 Inhaler; Refill: 5  4. Uncontrolled type 2 diabetes mellitus with hyperglycemia ([place]) Continue diabetic medication as prescribed. Check HgbA1c at next in house visit.   General Counseling: Sheral verbalizes understanding of the findings of today's phone visit and agrees with plan of treatment. I have discussed any further diagnostic evaluation that may be needed or ordered today. We also reviewed her medications today. she has been encouraged to call the office with any questions or concerns that should arise related to todays visit.  This patient was seen by [place] with Dr Lavera Guise as a part of collaborative care agreement  Meds ordered this encounter  Medications  . albuterol (VENTOLIN HFA) 108 (90 Base) MCG/ACT inhaler    Sig: Inhale 2 puffs into the lungs every 4 (four) hours as needed for wheezing or shortness of breath.    Dispense:  1 Inhaler    Refill:  5    Order Specific Question:   Supervising Provider    Answer:   Lavera Guise [[id number]]  . predniSONE (STERAPRED UNI-PAK 48 TAB) 10 MG (48) TBPK tablet    Sig: 12 day taper - take by mouth as directed for 12 days    Dispense:  48 tablet    Refill:  0    Order Specific Question:   Supervising Provider    Answer:   Lavera Guise [place]  . ciprofloxacin (CIPRO) 500 MG tablet    Sig: Take 1 tablet (500 mg total) by mouth 2 (two) times daily.    Dispense:  28 tablet    Refill:  0    Order Specific Question:   Supervising Provider    Answer:   Lavera Guise [[id number]]  . triamcinolone ointment (KENALOG) 0.5 %    Sig: Apply 1 application topically 2 (two) times daily.    Dispense:  30 g    Refill:  3    Order Specific Question:   Supervising Provider    Answer:   Lavera Guise [[id number]]    Time spent: 22 Minutes    Dr Lavera Guise Internal  medicine

## 2019-03-06 NOTE — Telephone Encounter (Signed)
Per Nira Conn pt rx changed from Cipro to Doxycycline 2xs daily for 14 days, informed [facility] of the change, sent new rx

## 2019-03-06 NOTE — Telephone Encounter (Signed)
Phar called cipro interact with theo we chang to doxycycline as per [employer]

## 2019-03-07 ENCOUNTER — Ambulatory Visit (INDEPENDENT_AMBULATORY_CARE_PROVIDER_SITE_OTHER): Payer: Medicare Other

## 2019-03-07 ENCOUNTER — Other Ambulatory Visit: Payer: Self-pay

## 2019-03-07 ENCOUNTER — Encounter: Payer: Self-pay | Admitting: Nurse Practitioner

## 2019-03-07 DIAGNOSIS — I4891 Unspecified atrial fibrillation: Secondary | ICD-10-CM

## 2019-03-07 LAB — PROTIME-INR: INR: 2.9 — AB (ref 0.9–1.1)

## 2019-03-07 NOTE — Progress Notes (Signed)
Pt INR 3.3 continue same med

## 2019-03-13 ENCOUNTER — Ambulatory Visit (INDEPENDENT_AMBULATORY_CARE_PROVIDER_SITE_OTHER): Payer: Medicare Other

## 2019-03-13 ENCOUNTER — Other Ambulatory Visit: Payer: Self-pay

## 2019-03-13 DIAGNOSIS — I8290 Acute embolism and thrombosis of unspecified vein: Secondary | ICD-10-CM | POA: Diagnosis not present

## 2019-03-13 NOTE — Progress Notes (Signed)
Pt inr 4.6 pt is on antibiotic we going to recheck  again next week continue same med for now

## 2019-03-21 ENCOUNTER — Ambulatory Visit (INDEPENDENT_AMBULATORY_CARE_PROVIDER_SITE_OTHER): Payer: Medicare Other

## 2019-03-21 ENCOUNTER — Other Ambulatory Visit: Payer: Self-pay

## 2019-03-21 DIAGNOSIS — Z7901 Long term (current) use of anticoagulants: Secondary | ICD-10-CM

## 2019-03-21 NOTE — Progress Notes (Signed)
Home inr was 4.7 patient is to skip a dos and continue after and recheck in one week, patient also states that her legs are looking normal for the first time in 7- 8 years and is wondering if that is due to the prednisone, Per Nira Conn it is but we can not keep her on prednisone long term.  Patient advised and understood

## 2019-03-27 ENCOUNTER — Encounter: Payer: Self-pay | Admitting: Nurse Practitioner

## 2019-03-27 LAB — PROTIME-INR
INR: 3.3 — AB (ref 0.9–1.1)
INR: 4.6 — AB (ref 0.9–1.1)
INR: 4.7 — AB (ref 0.9–1.1)

## 2019-03-28 ENCOUNTER — Other Ambulatory Visit: Payer: Self-pay

## 2019-03-28 ENCOUNTER — Ambulatory Visit (INDEPENDENT_AMBULATORY_CARE_PROVIDER_SITE_OTHER): Payer: Medicare Other

## 2019-03-28 DIAGNOSIS — Z7901 Long term (current) use of anticoagulants: Secondary | ICD-10-CM

## 2019-03-28 NOTE — Progress Notes (Signed)
Pt home inr was 3.6  Per heather is ok .

## 2019-04-03 ENCOUNTER — Other Ambulatory Visit: Payer: Self-pay

## 2019-04-03 DIAGNOSIS — I480 Paroxysmal atrial fibrillation: Secondary | ICD-10-CM | POA: Diagnosis not present

## 2019-04-03 DIAGNOSIS — I4821 Permanent atrial fibrillation: Secondary | ICD-10-CM | POA: Diagnosis not present

## 2019-04-03 MED ORDER — FLUTICASONE FUROATE-VILANTEROL 100-25 MCG/INH IN AEPB: INHALATION_SPRAY | RESPIRATORY_TRACT | 3 refills | Status: DC

## 2019-04-04 ENCOUNTER — Other Ambulatory Visit: Payer: Self-pay

## 2019-04-04 ENCOUNTER — Ambulatory Visit (INDEPENDENT_AMBULATORY_CARE_PROVIDER_SITE_OTHER): Payer: Medicare Other

## 2019-04-04 DIAGNOSIS — I4891 Unspecified atrial fibrillation: Secondary | ICD-10-CM

## 2019-04-04 DIAGNOSIS — I8290 Acute embolism and thrombosis of unspecified vein: Secondary | ICD-10-CM

## 2019-04-04 NOTE — Progress Notes (Signed)
Pt inr 2.2  Continue same as per [employer]

## 2019-04-11 ENCOUNTER — Encounter: Payer: Self-pay | Admitting: Nurse Practitioner

## 2019-04-11 ENCOUNTER — Ambulatory Visit (INDEPENDENT_AMBULATORY_CARE_PROVIDER_SITE_OTHER): Payer: Medicare Other

## 2019-04-11 ENCOUNTER — Other Ambulatory Visit: Payer: Self-pay

## 2019-04-11 DIAGNOSIS — I4891 Unspecified atrial fibrillation: Secondary | ICD-10-CM | POA: Diagnosis not present

## 2019-04-11 LAB — POCT INR: INR: 3.4 — AB (ref 2.0–3.0)

## 2019-04-11 LAB — PROTIME-INR
INR: 2.2 — AB (ref 0.9–1.1)
INR: 3.6 — AB (ref 0.9–1.1)

## 2019-04-11 NOTE — Progress Notes (Signed)
Pt INR is 3.4, as per Nira Conn ok to continue same routine.

## 2019-04-16 ENCOUNTER — Encounter: Payer: Self-pay | Admitting: Nurse Practitioner

## 2019-04-16 LAB — PROTIME-INR: INR: 3.4 — AB (ref 0.9–1.1)

## 2019-04-18 ENCOUNTER — Other Ambulatory Visit: Payer: Self-pay

## 2019-04-18 ENCOUNTER — Ambulatory Visit (INDEPENDENT_AMBULATORY_CARE_PROVIDER_SITE_OTHER): Payer: Medicare Other

## 2019-04-18 DIAGNOSIS — Z7901 Long term (current) use of anticoagulants: Secondary | ICD-10-CM

## 2019-04-18 MED ORDER — THEOPHYLLINE ER 100 MG PO CP24: ORAL_CAPSULE | ORAL | 3 refills | Status: DC

## 2019-04-18 MED ORDER — GLIPIZIDE ER 2.5 MG PO TB24: 2.5000 mg | ORAL_TABLET | Freq: Every day | ORAL | 1 refills | Status: DC

## 2019-04-18 NOTE — Progress Notes (Signed)
Pt inr 1.6 take 1/2 pill extra today total coumadin 6 mg today and go back to regular take coumadin 4 mg daily except Monday take 1/2 tab of 4 mg

## 2019-04-24 ENCOUNTER — Ambulatory Visit (INDEPENDENT_AMBULATORY_CARE_PROVIDER_SITE_OTHER): Payer: Medicare Other

## 2019-04-24 ENCOUNTER — Other Ambulatory Visit: Payer: Self-pay

## 2019-04-24 DIAGNOSIS — Z7901 Long term (current) use of anticoagulants: Secondary | ICD-10-CM | POA: Diagnosis not present

## 2019-04-24 NOTE — Progress Notes (Signed)
Pt INR 2.7 continue same med

## 2019-04-25 ENCOUNTER — Encounter: Payer: Self-pay | Admitting: Adult Health

## 2019-04-25 LAB — PROTIME-INR
INR: 1.6 — AB (ref 0.9–1.1)
INR: 2.7 — AB (ref 0.9–1.1)

## 2019-05-01 DIAGNOSIS — I480 Paroxysmal atrial fibrillation: Secondary | ICD-10-CM | POA: Diagnosis not present

## 2019-05-01 DIAGNOSIS — I4821 Permanent atrial fibrillation: Secondary | ICD-10-CM | POA: Diagnosis not present

## 2019-05-02 ENCOUNTER — Ambulatory Visit (INDEPENDENT_AMBULATORY_CARE_PROVIDER_SITE_OTHER): Payer: Medicare Other

## 2019-05-02 ENCOUNTER — Other Ambulatory Visit: Payer: Self-pay

## 2019-05-02 DIAGNOSIS — I4891 Unspecified atrial fibrillation: Secondary | ICD-10-CM

## 2019-05-02 NOTE — Progress Notes (Signed)
Home inr 3.0 patient to continue current treatment

## 2019-05-07 ENCOUNTER — Ambulatory Visit (INDEPENDENT_AMBULATORY_CARE_PROVIDER_SITE_OTHER): Payer: Medicare Other | Admitting: Nurse Practitioner

## 2019-05-07 ENCOUNTER — Other Ambulatory Visit: Payer: Self-pay

## 2019-05-07 ENCOUNTER — Encounter: Payer: Self-pay | Admitting: Nurse Practitioner

## 2019-05-07 VITALS — BP 140/57 | HR 81 | Resp 16 | Ht 64.0 in | Wt 159.8 lb

## 2019-05-07 DIAGNOSIS — I10 Essential (primary) hypertension: Secondary | ICD-10-CM | POA: Diagnosis not present

## 2019-05-07 DIAGNOSIS — J449 Chronic obstructive pulmonary disease, unspecified: Secondary | ICD-10-CM | POA: Diagnosis not present

## 2019-05-07 DIAGNOSIS — E1165 Type 2 diabetes mellitus with hyperglycemia: Secondary | ICD-10-CM | POA: Diagnosis not present

## 2019-05-07 DIAGNOSIS — L209 Atopic dermatitis, unspecified: Secondary | ICD-10-CM | POA: Diagnosis not present

## 2019-05-07 DIAGNOSIS — M064 Inflammatory polyarthropathy: Secondary | ICD-10-CM

## 2019-05-07 LAB — POCT GLYCOSYLATED HEMOGLOBIN (HGB A1C): Hemoglobin A1C: 6.7 % — AB (ref 4.0–5.6)

## 2019-05-07 MED ORDER — MAGNESIUM 200 MG PO TABS: 1.0000 | ORAL_TABLET | Freq: Every day | ORAL | 5 refills | Status: DC

## 2019-05-07 MED ORDER — TRIAMCINOLONE ACETONIDE 0.025 % EX CREA: 1.0000 "application " | TOPICAL_CREAM | Freq: Two times a day (BID) | CUTANEOUS | 3 refills | Status: DC

## 2019-05-07 NOTE — Progress Notes (Signed)
[facility] [place], [place] [address]  Internal MEDICINE  Office Visit Note  Patient Name: Monica [name]  [id number]  [id number]  Date of Service: 05/09/2019  Chief Complaint  Patient presents with  . Medical Management of Chronic Issues    2 month follow up, pt is complaining of having a lot of muscle cramps lately,   . Diabetes    A1C  . Hypertension  . Hyperlipidemia    The patient is here for routine follow up visit. Blood sugars are doing well. HgbA1c is 6.7 today. She is complaining of joint pain and muscle cramps. Drinks plenty of water. Not doing anything different with diet and exercise. She also has some peripheral neuropathy in both feet. She is otherwise doing well. Monitors her INRs at home.       Current Medication: Outpatient Encounter Medications as of 05/07/2019  Medication Sig  . albuterol (VENTOLIN HFA) 108 (90 Base) MCG/ACT inhaler Inhale 2 puffs into the lungs every 4 (four) hours as needed for wheezing or shortness of breath.  Marland Kitchen amLODipine-benazepril (LOTREL) 5-20 MG capsule TAKE 1 CAPSULE BY MOUTH EVERY DAY  . b complex vitamins capsule Take 1 capsule by mouth daily.  Marland Kitchen desloratadine (CLARINEX) 5 MG tablet Take 1 tablet (5 mg total) by mouth daily.  . famotidine (PEPCID) 20 MG tablet Take 1 tablet (20 mg total) by mouth 2 (two) times daily.  . fluticasone furoate-vilanterol (BREO ELLIPTA) 100-25 MCG/INH AEPB TAKE 1 PUFF ONCE A DAY  . furosemide (LASIX) 20 MG tablet Take 20 mg by mouth daily.   Marland Kitchen gabapentin (NEURONTIN) 300 MG capsule Take 2 capsules (600 mg total) by mouth at bedtime.  Marland Kitchen glipiZIDE (GLUCOTROL XL) 2.5 MG 24 hr tablet Take 1 tablet (2.5 mg total) by mouth daily.  . hydrochlorothiazide (HYDRODIURIL) 25 MG tablet Take 1 tablet (25 mg total) by mouth daily.  . Liniments (SALONPAS PAIN RELIEF PATCH EX) Apply topically.  . metFORMIN (GLUCOPHAGE-XR) 500 MG 24 hr tablet Take 2 tablets (1,000 mg total) by mouth daily.  .  montelukast (SINGULAIR) 10 MG tablet Take 10 mg by mouth at bedtime.  . ONE TOUCH ULTRA TEST test strip TEST ONCE A DAY AS DIRECTED  . oxybutynin (DITROPAN-XL) 10 MG 24 hr tablet Take 1 tablet (10 mg total) by mouth daily.  . pravastatin (PRAVACHOL) 40 MG tablet Take 1 tablet (40 mg total) by mouth daily.  . theophylline (THEO-24) 100 MG 24 hr capsule TAKE 1 CAPSULE BY MOUTH EVERY DAY  . Umeclidinium-Vilanterol (ANORO ELLIPTA) 62.5-25 MCG/INH AEPB Inhale 1 puff into the lungs daily.   Marland Kitchen warfarin (COUMADIN) 4 MG tablet TAKE ONE TABLET EVERY DAY EXCEPT MONDAY TAKE 1/2 TABLET  . [DISCONTINUED] triamcinolone ointment (KENALOG) 0.5 % Apply 1 application topically 2 (two) times daily.  . Magnesium 200 MG TABS Take 1 tablet (200 mg total) by mouth daily.  Marland Kitchen triamcinolone (KENALOG) 0.025 % cream Apply 1 application topically 2 (two) times daily. Ok to mix with topical lotion and apply to all affected areas.  . [DISCONTINUED] predniSONE (STERAPRED UNI-PAK 48 TAB) 10 MG (48) TBPK tablet 12 day taper - take by mouth as directed for 12 days (Patient not taking: Reported on 05/07/2019)   No facility-administered encounter medications on file as of 05/07/2019.     Surgical History: Past Surgical History:  Procedure Laterality Date  . ABDOMINAL HYSTERECTOMY  1975  . BLADDER SUSPENSION  1989  . COLONOSCOPY    . COLONOSCOPY WITH  PROPOFOL N/A 01/25/2018   Procedure: COLONOSCOPY WITH PROPOFOL;  Surgeon: [name], MD;  Location: [facility] ENDOSCOPY;  Service: Endoscopy;  Laterality: N/A;  . ESOPHAGOGASTRODUODENOSCOPY (EGD) WITH PROPOFOL N/A 01/24/2018   Procedure: ESOPHAGOGASTRODUODENOSCOPY (EGD) WITH PROPOFOL;  Surgeon: [name], MD;  Location: [facility] ENDOSCOPY;  Service: Endoscopy;  Laterality: N/A;    Medical History: Past Medical History:  Diagnosis Date  . Atrial fibrillation ([place])   . Bronchitis   . Cancer ([employer])    Left leg growth, kidneys, lungs and breasts  . Carcinoma of unknown primary ([place])    . COPD (chronic obstructive pulmonary disease) ([place])   . Diabetes mellitus without complication ([place])   . Hyperlipidemia   . Hypertension   . Pancreatitis   . Pneumonia   . Stroke ([place])    TIA's  . Vitamin B12 deficiency     Family History: Family History  Problem Relation Age of Onset  . Cancer Father   . Diabetes Brother     Social History   Socioeconomic History  . Marital status: Divorced    Spouse name: Not on file  . Number of children: Not on file  . Years of education: Not on file  . Highest education level: Not on file  Occupational History  . Not on file  Social Needs  . Financial resource strain: Not on file  . Food insecurity    Worry: Not on file    Inability: Not on file  . Transportation needs    Medical: Not on file    Non-medical: Not on file  Tobacco Use  . Smoking status: Former Smoker    Packs/day: 0.50    Years: 15.00    Pack years: 7.50    Types: Cigarettes    Quit date: 11/15/1988    Years since quitting: 30.4  . Smokeless tobacco: Never Used  Substance and Sexual Activity  . Alcohol use: No    Alcohol/week: 0.0 standard drinks  . Drug use: No  . Sexual activity: Not on file    Comment: hysterectomy   Lifestyle  . Physical activity    Days per week: Not on file    Minutes per session: Not on file  . Stress: Not on file  Relationships  . Social Herbalist on phone: Not on file    Gets together: Not on file    Attends religious service: Not on file    Active member of club or organization: Not on file    Attends meetings of clubs or organizations: Not on file    Relationship status: Not on file  . Intimate partner violence    Fear of current or ex partner: Not on file    Emotionally abused: Not on file    Physically abused: Not on file    Forced sexual activity: Not on file  Other Topics Concern  . Not on file  Social History Narrative  . Not on file      Review of Systems  Constitutional: Positive for  fatigue. Negative for chills and unexpected weight change.  HENT: Negative for congestion, postnasal drip, rhinorrhea, sneezing and sore throat.   Respiratory: Positive for wheezing. Negative for cough, chest tightness and shortness of breath.        Intermittent and well controlled on her current medications.   Cardiovascular: Negative for chest pain and palpitations.  Gastrointestinal: Negative for abdominal pain, constipation, diarrhea, nausea and vomiting.  Endocrine: Negative for cold intolerance, heat intolerance, polydipsia and  polyuria.       States that her blood sugars have been doing well.   Musculoskeletal: Positive for arthralgias, joint swelling and myalgias. Negative for back pain and neck pain.       The patient is describing generalized joint pain and muscle cramping. This is intermittent in nature   Skin: Negative for rash.       Generalized dryness and itchiness.   Allergic/Immunologic: Positive for environmental allergies.  Neurological: Positive for weakness. Negative for dizziness, tremors, numbness and headaches.  Hematological: Negative for adenopathy. Does not bruise/bleed easily.  Psychiatric/Behavioral: Negative for behavioral problems (Depression), sleep disturbance and suicidal ideas. The patient is not nervous/anxious.     Today's Vitals   05/07/19 1126  BP: (!) 140/57  Pulse: 81  Resp: 16  SpO2: 99%  Weight: 159 lb 12.8 oz (72.5 kg)  Height: 5\' 4"  (1.626 m)   Body mass index is 27.43 kg/m.  Physical Exam Vitals signs and nursing note reviewed.  Constitutional:      General: She is not in acute distress.    Appearance: Normal appearance. She is well-developed. She is not diaphoretic.  HENT:     Head: Normocephalic and atraumatic.     Mouth/Throat:     Pharynx: No oropharyngeal exudate.  Eyes:     Pupils: Pupils are equal, round, and reactive to light.  Neck:     Musculoskeletal: Normal range of motion and neck supple.     Thyroid: No  thyromegaly.     Vascular: No JVD.     Trachea: No tracheal deviation.  Cardiovascular:     Rate and Rhythm: Normal rate and regular rhythm.     Heart sounds: Normal heart sounds. No murmur. No friction rub. No gallop.   Pulmonary:     Effort: Pulmonary effort is normal. No respiratory distress.     Breath sounds: No wheezing or rales.     Comments: Mild wheezing in bilateral lung bases. Clear with cough  Chest:     Chest wall: No tenderness.  Abdominal:     General: Bowel sounds are normal.     Palpations: Abdomen is soft.     Tenderness: There is no abdominal tenderness.  Musculoskeletal: Normal range of motion.     Comments: Generalized joint tenderness present with swelling present in the joints of fingers and hands. There is point tenderness present.   Lymphadenopathy:     Cervical: No cervical adenopathy.  Skin:    General: Skin is warm and dry.  Neurological:     Mental Status: She is alert and oriented to person, place, and time. Mental status is at baseline.     Cranial Nerves: No cranial nerve deficit.  Psychiatric:        Behavior: Behavior normal.        Thought Content: Thought content normal.        Judgment: Judgment normal.   Assessment/Plan: 1. Uncontrolled type 2 diabetes mellitus with hyperglycemia (HCC) - POCT HgB A1C 6.7 today. Continue diabetic medications as prescribed. Monitor blood sugars closely.   2. Essential hypertension Stable. Continue bp medication as prescribed.  3. Atopic dermatitis, unspecified type Use triamcinolone cream 0.025% cream twice daily as needed. May be applied with lotion.  - triamcinolone (KENALOG) 0.025 % cream; Apply 1 application topically 2 (two) times daily. Ok to mix with topical lotion and apply to all affected areas.  Dispense: 80 g; Refill: 3  4. Inflammatory polyarthritis ([place]) Recommended labs, however, patient would rather  wait on this. Advised OTC magnesium and potassium supplement daily. Increase water intake.  Tylenol as needed and as indicated to help reduce pain.   5. Chronic obstructive pulmonary disease, unspecified COPD type ([place]) Stable. Continue ihalers and respiratory medications as prescribed.   General Counseling: Emelee verbalizes understanding of the findings of todays visit and agrees with plan of treatment. I have discussed any further diagnostic evaluation that may be needed or ordered today. We also reviewed her medications today. she has been encouraged to call the office with any questions or concerns that should arise related to todays visit.  Diabetes Counseling:  1. Addition of ACE inh/ ARB'S for nephroprotection. Microalbumin is updated  2. Diabetic foot care, prevention of complications. Podiatry consult 3. Exercise and lose weight.  4. Diabetic eye examination, Diabetic eye exam is updated  5. Monitor blood sugar closlely. nutrition counseling.  6. Sign and symptoms of hypoglycemia including shaking sweating,confusion and headaches.  This patient was seen by Leretha Pol FNP Collaboration with Dr Lavera Guise as a part of collaborative care agreement  Orders Placed This Encounter  Procedures  . POCT HgB A1C    Meds ordered this encounter  Medications  . Magnesium 200 MG TABS    Sig: Take 1 tablet (200 mg total) by mouth daily.    Dispense:  30 tablet    Refill:  5    Order Specific Question:   Supervising Provider    Answer:   Lavera Guise [[id number]]  . triamcinolone (KENALOG) 0.025 % cream    Sig: Apply 1 application topically 2 (two) times daily. Ok to mix with topical lotion and apply to all affected areas.    Dispense:  80 g    Refill:  3    Order Specific Question:   Supervising Provider    Answer:   Lavera Guise [[id number]]    Time spent: 61 Minutes      Dr Lavera Guise Internal medicine

## 2019-05-09 DIAGNOSIS — M064 Inflammatory polyarthropathy: Secondary | ICD-10-CM | POA: Insufficient documentation

## 2019-05-14 ENCOUNTER — Ambulatory Visit (INDEPENDENT_AMBULATORY_CARE_PROVIDER_SITE_OTHER): Payer: Medicare Other

## 2019-05-14 ENCOUNTER — Other Ambulatory Visit: Payer: Self-pay

## 2019-05-14 DIAGNOSIS — I82409 Acute embolism and thrombosis of unspecified deep veins of unspecified lower extremity: Secondary | ICD-10-CM

## 2019-05-14 DIAGNOSIS — I4891 Unspecified atrial fibrillation: Secondary | ICD-10-CM

## 2019-05-14 NOTE — Progress Notes (Signed)
Home inr 3.6 continue with current treatment

## 2019-05-17 ENCOUNTER — Other Ambulatory Visit: Payer: Self-pay

## 2019-05-17 ENCOUNTER — Ambulatory Visit (INDEPENDENT_AMBULATORY_CARE_PROVIDER_SITE_OTHER): Payer: Medicare Other

## 2019-05-17 ENCOUNTER — Other Ambulatory Visit: Payer: Self-pay | Admitting: Nurse Practitioner

## 2019-05-17 DIAGNOSIS — I4891 Unspecified atrial fibrillation: Secondary | ICD-10-CM

## 2019-05-17 DIAGNOSIS — I10 Essential (primary) hypertension: Secondary | ICD-10-CM

## 2019-05-17 MED ORDER — HYDROCHLOROTHIAZIDE 25 MG PO TABS: 25.0000 mg | ORAL_TABLET | Freq: Every day | ORAL | 5 refills | Status: DC

## 2019-05-17 NOTE — Progress Notes (Signed)
Pt Inr 4.6 pt advised as per heather skip tomorrow and go back to continue because already took today dose

## 2019-05-23 ENCOUNTER — Ambulatory Visit (INDEPENDENT_AMBULATORY_CARE_PROVIDER_SITE_OTHER): Payer: Medicare Other

## 2019-05-23 ENCOUNTER — Other Ambulatory Visit: Payer: Self-pay

## 2019-05-23 DIAGNOSIS — I8290 Acute embolism and thrombosis of unspecified vein: Secondary | ICD-10-CM

## 2019-05-23 NOTE — Progress Notes (Signed)
Pt INR 2.9 continue same med as per [employer]

## 2019-05-30 DIAGNOSIS — I480 Paroxysmal atrial fibrillation: Secondary | ICD-10-CM | POA: Diagnosis not present

## 2019-05-30 DIAGNOSIS — I4821 Permanent atrial fibrillation: Secondary | ICD-10-CM | POA: Diagnosis not present

## 2019-05-31 ENCOUNTER — Other Ambulatory Visit: Payer: Self-pay

## 2019-05-31 ENCOUNTER — Ambulatory Visit (INDEPENDENT_AMBULATORY_CARE_PROVIDER_SITE_OTHER): Payer: Medicare Other

## 2019-05-31 ENCOUNTER — Emergency Department: Payer: Medicare Other

## 2019-05-31 ENCOUNTER — Encounter: Payer: Self-pay | Admitting: Emergency Medicine

## 2019-05-31 ENCOUNTER — Inpatient Hospital Stay
Admission: EM | Admit: 2019-05-31 | Discharge: 2019-06-09 | DRG: 469 | Disposition: A | Discharge status: Skilled Nursing Facility | Payer: Medicare Other | Attending: Internal Medicine | Admitting: Internal Medicine

## 2019-05-31 DIAGNOSIS — Z471 Aftercare following joint replacement surgery: Secondary | ICD-10-CM | POA: Diagnosis not present

## 2019-05-31 DIAGNOSIS — E1140 Type 2 diabetes mellitus with diabetic neuropathy, unspecified: Secondary | ICD-10-CM | POA: Diagnosis present

## 2019-05-31 DIAGNOSIS — L8990 Pressure ulcer of unspecified site, unspecified stage: Secondary | ICD-10-CM | POA: Insufficient documentation

## 2019-05-31 DIAGNOSIS — E875 Hyperkalemia: Secondary | ICD-10-CM | POA: Diagnosis not present

## 2019-05-31 DIAGNOSIS — I10 Essential (primary) hypertension: Secondary | ICD-10-CM | POA: Diagnosis present

## 2019-05-31 DIAGNOSIS — D649 Anemia, unspecified: Secondary | ICD-10-CM | POA: Diagnosis present

## 2019-05-31 DIAGNOSIS — S72002A Fracture of unspecified part of neck of left femur, initial encounter for closed fracture: Secondary | ICD-10-CM | POA: Diagnosis present

## 2019-05-31 DIAGNOSIS — Z8673 Personal history of transient ischemic attack (TIA), and cerebral infarction without residual deficits: Secondary | ICD-10-CM | POA: Diagnosis not present

## 2019-05-31 DIAGNOSIS — E785 Hyperlipidemia, unspecified: Secondary | ICD-10-CM | POA: Diagnosis present

## 2019-05-31 DIAGNOSIS — E8770 Fluid overload, unspecified: Secondary | ICD-10-CM | POA: Diagnosis not present

## 2019-05-31 DIAGNOSIS — M25552 Pain in left hip: Secondary | ICD-10-CM | POA: Diagnosis not present

## 2019-05-31 DIAGNOSIS — Z7951 Long term (current) use of inhaled steroids: Secondary | ICD-10-CM | POA: Diagnosis not present

## 2019-05-31 DIAGNOSIS — W19XXXA Unspecified fall, initial encounter: Secondary | ICD-10-CM | POA: Diagnosis not present

## 2019-05-31 DIAGNOSIS — J9601 Acute respiratory failure with hypoxia: Secondary | ICD-10-CM | POA: Diagnosis not present

## 2019-05-31 DIAGNOSIS — J45909 Unspecified asthma, uncomplicated: Secondary | ICD-10-CM | POA: Diagnosis not present

## 2019-05-31 DIAGNOSIS — Z79899 Other long term (current) drug therapy: Secondary | ICD-10-CM | POA: Diagnosis not present

## 2019-05-31 DIAGNOSIS — M064 Inflammatory polyarthropathy: Secondary | ICD-10-CM | POA: Diagnosis present

## 2019-05-31 DIAGNOSIS — E1165 Type 2 diabetes mellitus with hyperglycemia: Secondary | ICD-10-CM | POA: Diagnosis not present

## 2019-05-31 DIAGNOSIS — Z853 Personal history of malignant neoplasm of breast: Secondary | ICD-10-CM

## 2019-05-31 DIAGNOSIS — Z7401 Bed confinement status: Secondary | ICD-10-CM | POA: Diagnosis not present

## 2019-05-31 DIAGNOSIS — Z809 Family history of malignant neoplasm, unspecified: Secondary | ICD-10-CM | POA: Diagnosis not present

## 2019-05-31 DIAGNOSIS — R21 Rash and other nonspecific skin eruption: Secondary | ICD-10-CM | POA: Diagnosis not present

## 2019-05-31 DIAGNOSIS — Z7984 Long term (current) use of oral hypoglycemic drugs: Secondary | ICD-10-CM

## 2019-05-31 DIAGNOSIS — G2581 Restless legs syndrome: Secondary | ICD-10-CM | POA: Diagnosis present

## 2019-05-31 DIAGNOSIS — I4891 Unspecified atrial fibrillation: Secondary | ICD-10-CM | POA: Diagnosis present

## 2019-05-31 DIAGNOSIS — Z87891 Personal history of nicotine dependence: Secondary | ICD-10-CM

## 2019-05-31 DIAGNOSIS — M2550 Pain in unspecified joint: Secondary | ICD-10-CM | POA: Diagnosis not present

## 2019-05-31 DIAGNOSIS — T7840XS Allergy, unspecified, sequela: Secondary | ICD-10-CM | POA: Diagnosis not present

## 2019-05-31 DIAGNOSIS — R0602 Shortness of breath: Secondary | ICD-10-CM

## 2019-05-31 DIAGNOSIS — Z96642 Presence of left artificial hip joint: Secondary | ICD-10-CM | POA: Diagnosis not present

## 2019-05-31 DIAGNOSIS — Z85528 Personal history of other malignant neoplasm of kidney: Secondary | ICD-10-CM | POA: Diagnosis not present

## 2019-05-31 DIAGNOSIS — M545 Low back pain: Secondary | ICD-10-CM | POA: Diagnosis not present

## 2019-05-31 DIAGNOSIS — I480 Paroxysmal atrial fibrillation: Secondary | ICD-10-CM | POA: Diagnosis not present

## 2019-05-31 DIAGNOSIS — S72012A Unspecified intracapsular fracture of left femur, initial encounter for closed fracture: Secondary | ICD-10-CM | POA: Diagnosis not present

## 2019-05-31 DIAGNOSIS — Z85118 Personal history of other malignant neoplasm of bronchus and lung: Secondary | ICD-10-CM | POA: Diagnosis not present

## 2019-05-31 DIAGNOSIS — S99922A Unspecified injury of left foot, initial encounter: Secondary | ICD-10-CM | POA: Diagnosis not present

## 2019-05-31 DIAGNOSIS — Z833 Family history of diabetes mellitus: Secondary | ICD-10-CM | POA: Diagnosis not present

## 2019-05-31 DIAGNOSIS — M79673 Pain in unspecified foot: Secondary | ICD-10-CM

## 2019-05-31 DIAGNOSIS — E7800 Pure hypercholesterolemia, unspecified: Secondary | ICD-10-CM | POA: Diagnosis present

## 2019-05-31 DIAGNOSIS — L89326 Pressure-induced deep tissue damage of left buttock: Secondary | ICD-10-CM | POA: Diagnosis present

## 2019-05-31 DIAGNOSIS — Z03818 Encounter for observation for suspected exposure to other biological agents ruled out: Secondary | ICD-10-CM | POA: Diagnosis not present

## 2019-05-31 DIAGNOSIS — W1830XA Fall on same level, unspecified, initial encounter: Secondary | ICD-10-CM | POA: Diagnosis present

## 2019-05-31 DIAGNOSIS — N398 Other specified disorders of urinary system: Secondary | ICD-10-CM | POA: Diagnosis not present

## 2019-05-31 DIAGNOSIS — S72042A Displaced fracture of base of neck of left femur, initial encounter for closed fracture: Secondary | ICD-10-CM | POA: Diagnosis not present

## 2019-05-31 DIAGNOSIS — S72002D Fracture of unspecified part of neck of left femur, subsequent encounter for closed fracture with routine healing: Secondary | ICD-10-CM | POA: Diagnosis not present

## 2019-05-31 DIAGNOSIS — J449 Chronic obstructive pulmonary disease, unspecified: Secondary | ICD-10-CM | POA: Diagnosis present

## 2019-05-31 DIAGNOSIS — E538 Deficiency of other specified B group vitamins: Secondary | ICD-10-CM | POA: Diagnosis present

## 2019-05-31 DIAGNOSIS — R339 Retention of urine, unspecified: Secondary | ICD-10-CM | POA: Diagnosis not present

## 2019-05-31 DIAGNOSIS — I959 Hypotension, unspecified: Secondary | ICD-10-CM | POA: Diagnosis not present

## 2019-05-31 DIAGNOSIS — Z96649 Presence of unspecified artificial hip joint: Secondary | ICD-10-CM

## 2019-05-31 DIAGNOSIS — H9190 Unspecified hearing loss, unspecified ear: Secondary | ICD-10-CM | POA: Diagnosis present

## 2019-05-31 DIAGNOSIS — Z1159 Encounter for screening for other viral diseases: Secondary | ICD-10-CM | POA: Diagnosis not present

## 2019-05-31 DIAGNOSIS — M6281 Muscle weakness (generalized): Secondary | ICD-10-CM | POA: Diagnosis not present

## 2019-05-31 DIAGNOSIS — R52 Pain, unspecified: Secondary | ICD-10-CM | POA: Diagnosis not present

## 2019-05-31 DIAGNOSIS — R0902 Hypoxemia: Secondary | ICD-10-CM | POA: Diagnosis not present

## 2019-05-31 DIAGNOSIS — Z4789 Encounter for other orthopedic aftercare: Secondary | ICD-10-CM | POA: Diagnosis not present

## 2019-05-31 DIAGNOSIS — E119 Type 2 diabetes mellitus without complications: Secondary | ICD-10-CM | POA: Diagnosis not present

## 2019-05-31 DIAGNOSIS — E871 Hypo-osmolality and hyponatremia: Secondary | ICD-10-CM | POA: Diagnosis not present

## 2019-05-31 DIAGNOSIS — K210 Gastro-esophageal reflux disease with esophagitis: Secondary | ICD-10-CM | POA: Diagnosis not present

## 2019-05-31 LAB — SARS CORONAVIRUS 2 BY RT PCR (HOSPITAL ORDER, PERFORMED IN ~~LOC~~ HOSPITAL LAB): SARS Coronavirus 2: NEGATIVE

## 2019-05-31 LAB — GLUCOSE, CAPILLARY
Glucose-Capillary: 149 mg/dL — ABNORMAL HIGH (ref 70–99)
Glucose-Capillary: 143 mg/dL — ABNORMAL HIGH (ref 70–99)
Glucose-Capillary: 150 mg/dL — ABNORMAL HIGH (ref 70–99)
Glucose-Capillary: 164 mg/dL — ABNORMAL HIGH (ref 70–99)

## 2019-05-31 LAB — COMPREHENSIVE METABOLIC PANEL
ALT: 21 U/L (ref 0–44)
AST: 31 U/L (ref 15–41)
Albumin: 3.9 g/dL (ref 3.5–5.0)
Alkaline Phosphatase: 103 U/L (ref 38–126)
Anion gap: 10 (ref 5–15)
BUN: 16 mg/dL (ref 8–23)
CO2: 24 mmol/L (ref 22–32)
Calcium: 9.4 mg/dL (ref 8.9–10.3)
Chloride: 92 mmol/L — ABNORMAL LOW (ref 98–111)
Creatinine, Ser: 0.79 mg/dL (ref 0.44–1.00)
GFR calc Af Amer: >60 mL/min (ref >60)
GFR calc non Af Amer: >60 mL/min (ref >60)
Glucose, Bld: 170 mg/dL — ABNORMAL HIGH (ref 70–99)
Potassium: 4 mmol/L (ref 3.5–5.1)
Sodium: 126 mmol/L — ABNORMAL LOW (ref 135–145)
Total Bilirubin: 1.1 mg/dL (ref 0.3–1.2)
Total Protein: 6.8 g/dL (ref 6.5–8.1)

## 2019-05-31 LAB — PROTIME-INR
INR: 2.4 — ABNORMAL HIGH (ref 0.8–1.2)
Prothrombin Time: 25.6 seconds — ABNORMAL HIGH (ref 11.4–15.2)

## 2019-05-31 LAB — CBC
HCT: 37.3 % (ref 36.0–46.0)
Hemoglobin: 12.5 g/dL (ref 12.0–15.0)
MCH: 27.2 pg (ref 26.0–34.0)
MCHC: 33.5 g/dL (ref 30.0–36.0)
MCV: 81.1 fL (ref 80.0–100.0)
Platelets: 287 10*3/uL (ref 150–400)
RBC: 4.6 MIL/uL (ref 3.87–5.11)
RDW: 13.9 % (ref 11.5–15.5)
WBC: 18.1 10*3/uL — ABNORMAL HIGH (ref 4.0–10.5)
nRBC: 0 % (ref 0.0–0.2)

## 2019-05-31 MED ORDER — INSULIN ASPART 100 UNIT/ML ~~LOC~~ SOLN
0.0000 [IU] | SUBCUTANEOUS | Status: DC
  Administered 2019-05-31 (×2): 1 [IU] via SUBCUTANEOUS
  Administered 2019-05-31: 22:00:00 2 [IU] via SUBCUTANEOUS
  Administered 2019-06-01 (×2): 1 [IU] via SUBCUTANEOUS
  Administered 2019-06-01: 21:00:00 3 [IU] via SUBCUTANEOUS
  Administered 2019-06-01: 02:00:00 1 [IU] via SUBCUTANEOUS
  Administered 2019-06-01 – 2019-06-02 (×6): 2 [IU] via SUBCUTANEOUS
  Administered 2019-06-02: 1 [IU] via SUBCUTANEOUS
  Administered 2019-06-03 (×2): 2 [IU] via SUBCUTANEOUS
  Administered 2019-06-04 (×3): 1 [IU] via SUBCUTANEOUS
  Administered 2019-06-04 – 2019-06-05 (×4): 2 [IU] via SUBCUTANEOUS
  Administered 2019-06-05: 1 [IU] via SUBCUTANEOUS
  Filled 2019-05-31 (×21): qty 1

## 2019-05-31 MED ORDER — ONDANSETRON HCL 4 MG PO TABS: 4.0000 mg | ORAL_TABLET | Freq: Four times a day (QID) | ORAL | Status: DC | PRN

## 2019-05-31 MED ORDER — SODIUM CHLORIDE 0.9 % IV SOLN
INTRAVENOUS | Status: DC
  Administered 2019-06-03 (×2): via INTRAVENOUS

## 2019-05-31 MED ORDER — TRAMADOL HCL 50 MG PO TABS
50.0000 mg | ORAL_TABLET | Freq: Four times a day (QID) | ORAL | Status: DC | PRN
  Administered 2019-05-31 – 2019-06-06 (×9): 50 mg via ORAL
  Filled 2019-05-31 (×9): qty 1

## 2019-05-31 MED ORDER — AMLODIPINE BESY-BENAZEPRIL HCL 5-20 MG PO CAPS: 1.0000 | ORAL_CAPSULE | Freq: Every day | ORAL | Status: DC

## 2019-05-31 MED ORDER — BENAZEPRIL HCL 20 MG PO TABS
20.0000 mg | ORAL_TABLET | Freq: Every day | ORAL | Status: DC
  Administered 2019-05-31: 20 mg via ORAL
  Filled 2019-05-31 (×3): qty 1

## 2019-05-31 MED ORDER — ONDANSETRON HCL 4 MG/2ML IJ SOLN
4.0000 mg | Freq: Once | INTRAMUSCULAR | Status: AC
  Administered 2019-05-31: 4 mg via INTRAVENOUS
  Filled 2019-05-31: qty 2

## 2019-05-31 MED ORDER — ALBUTEROL SULFATE (2.5 MG/3ML) 0.083% IN NEBU: 2.5000 mg | INHALATION_SOLUTION | RESPIRATORY_TRACT | Status: DC | PRN

## 2019-05-31 MED ORDER — MORPHINE SULFATE (PF) 2 MG/ML IV SOLN
2.0000 mg | Freq: Once | INTRAVENOUS | Status: AC
  Administered 2019-05-31: 2 mg via INTRAVENOUS
  Filled 2019-05-31: qty 1

## 2019-05-31 MED ORDER — GABAPENTIN 300 MG PO CAPS
600.0000 mg | ORAL_CAPSULE | Freq: Every day | ORAL | Status: DC
  Administered 2019-05-31 – 2019-06-08 (×9): 600 mg via ORAL
  Filled 2019-05-31 (×9): qty 2

## 2019-05-31 MED ORDER — B COMPLEX VITAMINS PO CAPS: 1.0000 | ORAL_CAPSULE | Freq: Every day | ORAL | Status: DC

## 2019-05-31 MED ORDER — PHYTONADIONE 5 MG PO TABS
2.5000 mg | ORAL_TABLET | Freq: Once | ORAL | Status: AC
  Administered 2019-05-31: 2.5 mg via ORAL
  Filled 2019-05-31 (×2): qty 1

## 2019-05-31 MED ORDER — OXYCODONE HCL 5 MG PO TABS
5.0000 mg | ORAL_TABLET | ORAL | Status: DC | PRN
  Filled 2019-05-31 (×2): qty 1

## 2019-05-31 MED ORDER — OXYBUTYNIN CHLORIDE ER 5 MG PO TB24
10.0000 mg | ORAL_TABLET | Freq: Every day | ORAL | Status: DC
  Administered 2019-06-02 – 2019-06-09 (×7): 10 mg via ORAL
  Filled 2019-05-31: qty 2
  Filled 2019-05-31: qty 1
  Filled 2019-05-31: qty 2
  Filled 2019-05-31: qty 1
  Filled 2019-05-31: qty 2
  Filled 2019-05-31: qty 1
  Filled 2019-05-31 (×2): qty 2
  Filled 2019-05-31: qty 1
  Filled 2019-05-31: qty 2

## 2019-05-31 MED ORDER — TRAZODONE HCL 50 MG PO TABS: 25.0000 mg | ORAL_TABLET | Freq: Every evening | ORAL | Status: DC | PRN

## 2019-05-31 MED ORDER — PRAVASTATIN SODIUM 20 MG PO TABS
40.0000 mg | ORAL_TABLET | Freq: Every day | ORAL | Status: DC
  Administered 2019-06-01 – 2019-06-09 (×8): 40 mg via ORAL
  Filled 2019-05-31: qty 2
  Filled 2019-05-31: qty 1
  Filled 2019-05-31: qty 2
  Filled 2019-05-31 (×2): qty 1
  Filled 2019-05-31 (×3): qty 2
  Filled 2019-05-31: qty 1
  Filled 2019-05-31 (×3): qty 2

## 2019-05-31 MED ORDER — MONTELUKAST SODIUM 10 MG PO TABS
10.0000 mg | ORAL_TABLET | Freq: Every day | ORAL | Status: DC
  Administered 2019-05-31 – 2019-06-08 (×9): 10 mg via ORAL
  Filled 2019-05-31 (×9): qty 1

## 2019-05-31 MED ORDER — THEOPHYLLINE ER 100 MG PO CP24
100.0000 mg | ORAL_CAPSULE | ORAL | Status: DC
  Administered 2019-06-02 – 2019-06-08 (×4): 100 mg via ORAL
  Filled 2019-05-31 (×5): qty 1

## 2019-05-31 MED ORDER — FUROSEMIDE 20 MG PO TABS
20.0000 mg | ORAL_TABLET | Freq: Every day | ORAL | Status: DC
  Filled 2019-05-31: qty 1

## 2019-05-31 MED ORDER — B COMPLEX-C PO TABS
1.0000 | ORAL_TABLET | Freq: Every day | ORAL | Status: DC
  Administered 2019-06-02 – 2019-06-09 (×7): 1 via ORAL
  Filled 2019-05-31 (×10): qty 1

## 2019-05-31 MED ORDER — OXYCODONE HCL 5 MG PO TABS
5.0000 mg | ORAL_TABLET | ORAL | Status: DC | PRN
  Administered 2019-05-31 (×2): 5 mg via ORAL
  Filled 2019-05-31 (×2): qty 1

## 2019-05-31 MED ORDER — FAMOTIDINE 20 MG PO TABS
20.0000 mg | ORAL_TABLET | Freq: Every day | ORAL | Status: DC
  Administered 2019-06-02 – 2019-06-09 (×6): 20 mg via ORAL
  Filled 2019-05-31 (×8): qty 1

## 2019-05-31 MED ORDER — ONDANSETRON HCL 4 MG/2ML IJ SOLN: 4.0000 mg | Freq: Four times a day (QID) | INTRAMUSCULAR | Status: DC | PRN

## 2019-05-31 MED ORDER — MAGNESIUM OXIDE 400 (241.3 MG) MG PO TABS
400.0000 mg | ORAL_TABLET | Freq: Every day | ORAL | Status: DC
  Administered 2019-06-02 – 2019-06-09 (×7): 400 mg via ORAL
  Filled 2019-05-31 (×8): qty 1

## 2019-05-31 MED ORDER — HYDROCHLOROTHIAZIDE 25 MG PO TABS: 25.0000 mg | ORAL_TABLET | Freq: Every day | ORAL | Status: DC

## 2019-05-31 MED ORDER — AMLODIPINE BESYLATE 5 MG PO TABS
5.0000 mg | ORAL_TABLET | Freq: Every day | ORAL | Status: DC
  Administered 2019-05-31: 18:00:00 5 mg via ORAL
  Filled 2019-05-31: qty 1

## 2019-05-31 MED ORDER — MAGNESIUM HYDROXIDE 400 MG/5ML PO SUSP: 30.0000 mL | Freq: Every day | ORAL | Status: DC | PRN

## 2019-05-31 MED ORDER — LORATADINE 10 MG PO TABS
10.0000 mg | ORAL_TABLET | Freq: Every day | ORAL | Status: DC
  Administered 2019-06-02 – 2019-06-09 (×7): 10 mg via ORAL
  Filled 2019-05-31 (×8): qty 1

## 2019-05-31 NOTE — ED Notes (Signed)
Returned from XR 

## 2019-05-31 NOTE — ED Provider Notes (Signed)
[facility] Emergency Department Provider Note ___________   First MD Initiated Contact with Patient 05/31/19 0404     (approximate)  I have reviewed the triage vital signs and the nursing notes.   HISTORY  Chief Complaint Fall    HPI Monica [name] is a 85 y.o. female with below list of previous medical conditions presents to the emergency department via EMS with history of accidental loss of balance with resultant fall onto her left hip.  Patient admits to 10 out of 10 left hip pain at present worse with any movement of the left leg.  Patient denies any head injury no loss of consciousness.        Past Medical History:  Diagnosis Date  . Atrial fibrillation ([place])   . Bronchitis   . Cancer ([place])    Left leg growth, kidneys, lungs and breasts  . Carcinoma of unknown primary ([place])   . COPD (chronic obstructive pulmonary disease) ([place])   . Diabetes mellitus without complication ([place])   . Hyperlipidemia   . Hypertension   . Pancreatitis   . Pneumonia   . Stroke ([place])    TIA's  . Vitamin B12 deficiency     Patient Active Problem List   Diagnosis Date Noted  . Inflammatory polyarthritis ([place]) 05/09/2019  . Deep vein thrombosis (DVT) ([place]) 11/29/2018  . Edema of left lower extremity 11/13/2018  . Cellulitis of left lower leg 11/01/2018  . Encounter for therapeutic drug level monitoring 07/05/2018  . Allergic contact dermatitis 04/19/2018  . Atopic dermatitis 03/15/2018  . 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 Atrial fibrillation ([place]) 02/12/2018  . Melena   . Urinary tract infection without hematuria 01/04/2018  . Chronic obstructive pulmonary disease ([place]) 12/27/2017  . Acute cystitis with hematuria 12/27/2017  . Dysuria 12/27/2017  . Urinary frequency 12/27/2017  . Uncontrolled type 2 diabetes mellitus with hyperglycemia ([place]) 12/27/2017  . Iron deficiency anemia due to chronic blood loss 09/28/2016  . Avitaminosis D 09/09/2016  .  B12 deficiency 09/09/2016  . Temporary cerebral vascular dysfunction 09/09/2016  . Spinal stenosis 09/09/2016  . Pain in shoulder 09/09/2016  . Restless legs syndrome 09/09/2016  . Personal history of urinary calculi 09/09/2016  . H/O deep venous thrombosis 09/09/2016  . Gout 09/09/2016  . Accumulation of fluid in tissues 09/09/2016  . Carpal tunnel syndrome 09/09/2016  . Chronic lung disease 09/09/2016  . Cataract 09/09/2016  . Appendicular ataxia 09/09/2016  . Airway hyperreactivity 09/09/2016  . Rectal bleeding   . Anemia due to blood loss, acute   . Gastrointestinal hemorrhage 08/08/2016  . Carcinoma of unknown primary ([place]) 08/02/2016  . Diabetes ([place]) 05/08/2015  . Arthropathy of hand 02/04/2010  . 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 Detrusor instability of bladder 06/18/2009  . Pure hypercholesterolemia 04/10/2009  . Essential hypertension 04/10/2009  . Diverticulitis of colon 04/10/2009    Past Surgical History:  Procedure Laterality Date  . ABDOMINAL HYSTERECTOMY  1975  . BLADDER SUSPENSION  1989  . COLONOSCOPY    . COLONOSCOPY WITH PROPOFOL N/A 01/25/2018   Procedure: COLONOSCOPY WITH PROPOFOL;  Surgeon: [name], MD;  Location: [facility] ENDOSCOPY;  Service: Endoscopy;  Laterality: N/A;  . ESOPHAGOGASTRODUODENOSCOPY (EGD) WITH PROPOFOL N/A 01/24/2018   Procedure: ESOPHAGOGASTRODUODENOSCOPY (EGD) WITH PROPOFOL;  Surgeon: [name], MD;  Location: [facility] ENDOSCOPY;  Service: Endoscopy;  Laterality: N/A;    Prior to Admission medications   Medication Sig Start Date End Date Taking? Authorizing Provider  albuterol (VENTOLIN HFA) 108 (90 Base) MCG/ACT inhaler Inhale 2 puffs into the lungs every 4 (four) hours as needed for wheezing or shortness of breath. 03/06/19   Boscia, Greer Ee, NP  amLODipine-benazepril (LOTREL) 5-20 MG capsule TAKE 1 CAPSULE BY MOUTH EVERY DAY 02/16/19   [name], NP  b complex vitamins capsule Take 1 capsule by mouth daily.    [provider]  desloratadine (CLARINEX) 5 MG tablet Take 1 tablet (5 mg total) by mouth daily. 02/16/19   [name], NP  famotidine (PEPCID) 20 MG tablet Take 1 tablet (20 mg total) by mouth 2 (two) times daily. 08/11/16   Epifanio Lesches, MD  fluticasone furoate-vilanterol (BREO ELLIPTA) 100-25 MCG/INH AEPB TAKE 1 PUFF ONCE A DAY 04/03/19   [name], NP  furosemide (LASIX) 20 MG tablet Take 20 mg by mouth daily.  05/24/16   [provider]  gabapentin (NEURONTIN) 300 MG capsule Take 2 capsules (600 mg total) by mouth at bedtime. 02/16/19   [name], NP  glipiZIDE (GLUCOTROL XL) 2.5 MG 24 hr tablet Take 1 tablet (2.5 mg total) by mouth daily. 04/18/19   [name], NP  hydrochlorothiazide (HYDRODIURIL) 25 MG tablet Take 1 tablet (25 mg total) by mouth daily. 05/17/19   [name], NP  Liniments (SALONPAS PAIN RELIEF PATCH EX) Apply topically.    [provider]  Magnesium 200 MG TABS Take 1 tablet (200 mg total) by mouth daily. 05/07/19   [name], NP  metFORMIN (GLUCOPHAGE-XR) 500 MG 24 hr tablet Take 2 tablets (1,000 mg total) by mouth daily. 01/22/19   [name], NP  montelukast (SINGULAIR) 10 MG tablet Take 10 mg by mouth at bedtime.    [provider]  ONE TOUCH ULTRA TEST test strip TEST ONCE A DAY AS DIRECTED 01/30/19   [name], NP  oxybutynin (DITROPAN-XL) 10 MG 24 hr tablet Take 1 tablet (10 mg total) by mouth daily. 01/22/19   [name], NP  pravastatin (PRAVACHOL) 40 MG tablet Take 1 tablet (40 mg total) by mouth daily. 01/22/19   [name], NP  theophylline (THEO-24) 100 MG 24 hr capsule TAKE 1 CAPSULE BY MOUTH EVERY DAY 04/18/19   [name], NP  triamcinolone (KENALOG) 0.025 % cream Apply 1 application topically 2 (two) times daily. Ok to mix with topical lotion and apply to all affected areas. 05/07/19   [name], NP  Umeclidinium-Vilanterol (ANORO ELLIPTA) 62.5-25 MCG/INH  AEPB Inhale 1 puff into the lungs daily.  12/24/14   [provider]  warfarin (COUMADIN) 4 MG tablet TAKE ONE TABLET EVERY DAY EXCEPT MONDAY TAKE 1/2 TABLET 02/16/19   [name], NP    Allergies Sulfa antibiotics, Celecoxib, Acetaminophen, Codeine, Lyrica [pregabalin], Penicillin g, and Petrolatum-zinc oxide  Family History  Problem Relation Age of Onset  . Cancer Father   . Diabetes Brother     Social History Social History   Tobacco Use  . Smoking status: Former Smoker    Packs/day: 0.50    Years: 15.00    Pack years: 7.50    Types: Cigarettes    Quit date: 11/15/1988    Years since quitting: 30.5  . Smokeless tobacco: Never Used  Substance Use Topics  . Alcohol use: No    Alcohol/week: 0.0 standard drinks  . Drug use: No    Review of Systems Constitutional: No fever/chills Eyes: No visual changes. ENT: No  sore throat. Cardiovascular: Denies chest pain. Respiratory: Denies shortness of breath. Gastrointestinal: No abdominal pain.  No nausea, no vomiting.  No diarrhea.  No constipation. Genitourinary: Negative for dysuria. Musculoskeletal: Negative for neck pain.  Negative for back pain.  Positive for left hip pain Integumentary: Negative for rash. Neurological: Negative for headaches, focal weakness or numbness.  ____________________________________________   PHYSICAL EXAM:  VITAL SIGNS: ED Triage Vitals  Enc Vitals Group     BP 05/31/19 0423 112/67     Pulse Rate 05/31/19 0340 (!) 110     Resp 05/31/19 0340 20     Temp 05/31/19 0340 98.3 F (36.8 C)     Temp Source 05/31/19 0340 Oral     SpO2 05/31/19 0340 92 %     Weight 05/31/19 0340 71.2 kg (157 lb)     Height 05/31/19 0340 1.626 m (5\' 4" )     Head Circumference --      Peak Flow --      Pain Score 05/31/19 0340 9     Pain Loc --      Pain Edu? --      Excl. in [place]? --     Constitutional: Alert and oriented.  Apparent discomfort Eyes: Conjunctivae are normal.  Head: Atraumatic.  Mouth/Throat: Mucous membranes are moist. Oropharynx non-erythematous. Neck: No stridor.  No cervical spine tenderness to palpation. Cardiovascular: Normal rate, regular rhythm. Good peripheral circulation. Grossly normal heart sounds. Respiratory: Normal respiratory effort.  No retractions. No audible wheezing. Gastrointestinal: Soft and nontender. No distention.  Musculoskeletal: Left hip pain with external rotation of the left leg, pain with very gentle palpation of the left hip. Neurologic:  Normal speech and language. No gross focal neurologic deficits are appreciated.  Skin:  Skin is warm, dry and intact. No rash noted. Psychiatric: Mood and affect are normal. Speech and behavior are normal.  ____________________________________________   LABS (all labs ordered are listed, but only abnormal results are displayed)  Labs Reviewed  CBC - Abnormal; Notable for the following components:      Result Value   WBC 18.1 (*)    All other components within normal limits  COMPREHENSIVE METABOLIC PANEL - Abnormal; Notable for the following components:   Sodium 126 (*)    Chloride 92 (*)    Glucose, Bld 170 (*)    All other components within normal limits  PROTIME-INR - Abnormal; Notable for the following components:   Prothrombin Time 25.6 (*)    INR 2.4 (*)    All other components within normal limits    RADIOLOGY I, [place] Emmett Bracknell, personally viewed and evaluated these images (plain radiographs) as part of my medical decision making, as well as reviewing the written report by the radiologist.  ED MD interpretation: Impacted left subcapital femoral neck fracture on x-ray.  Official radiology report(s): Dg Lumbar Spine Complete  Result Date: 05/31/2019 CLINICAL DATA:  Back pain after fall EXAM: LUMBAR SPINE - COMPLETE 4+ VIEW COMPARISON:  12/12/2013 abdominal CT FINDINGS: Negative for acute fracture or traumatic malalignment. Generalized facet degeneration with slight L4-5  anterolisthesis. Mild lumbar levocurvature. Generalized osteopenia. IMPRESSION: No acute finding. Electronically Signed   By: Monte Fantasia M.D.   On: 05/31/2019 04:24   Dg Hip Unilat W Or Wo Pelvis 2-3 Views Left  Result Date: 05/31/2019 CLINICAL DATA:  Fall with left hip pain EXAM: DG HIP (WITH OR WITHOUT PELVIS) 2-3V LEFT COMPARISON:  None. FINDINGS: Subcapital left femoral neck fracture with dorsal and lateral impaction.  No dislocation. Generalized osteopenia.  No notable acetabular spurring. IMPRESSION: Impacted left subcapital femoral neck fracture. Electronically Signed   By: Monte Fantasia M.D.   On: 05/31/2019 04:21     Procedures   ____________________________________________   INITIAL IMPRESSION / MDM / [place] / ED COURSE  As part of my medical decision making, I reviewed the following data within the [employer] NUMBER   85 year old female presented with above-stated history and physical exam concerning for left hip fracture which was confirmed on x-ray.  Patient given IV morphine 2 mg shortly after arrival to the emergency department.  Patient states that current pain score is 7 out of 10 and as such was given an additional 2 mg of IV morphine.  Patient discussed with Dr. Marry Guan orthopedic surgeon on call as well as Dr. Sidney Ace hospitalist on-call for admission.  ____________________________________________  FINAL CLINICAL IMPRESSION(S) / ED DIAGNOSES  Final diagnoses:  Left displaced femoral neck fracture ([place])     MEDICATIONS GIVEN DURING THIS VISIT:  Medications  morphine 2 MG/ML injection 2 mg (2 mg Intravenous Given 05/31/19 0350)  ondansetron (ZOFRAN) injection 4 mg (4 mg Intravenous Given 05/31/19 0350)     ED Discharge Orders    None      *Please note:  Monica [name] was evaluated in Emergency Department on 05/31/2019 for the symptoms described in the history of present illness. She was evaluated in the context of the global COVID-19  pandemic, which necessitated consideration that the patient might be at risk for infection with the SARS-CoV-2 virus that causes COVID-19. Institutional protocols and algorithms that pertain to the evaluation of patients at risk for COVID-19 are in a state of rapid change based on information released by regulatory bodies including the CDC and federal and state organizations. These policies and algorithms were followed during the patient's care in the ED.  Some ED evaluations and interventions may be delayed as a result of limited staffing during the pandemic.*  Note:  This document was prepared using Dragon voice recognition software and may include unintentional dictation errors.   Gregor Hams, MD 05/31/19 [phone]

## 2019-05-31 NOTE — Consult Note (Signed)
ORTHOPAEDIC CONSULTATION  PATIENT NAME: Monica [name] DOB: 12-Jan-1926  MRN: [id number]  REQUESTING PHYSICIAN: Mansy, Arvella Merles, MD  Chief Complaint: Left hip pain  HPI: Monica [name] is a 85 y.o. female who fell last night while doing laundry. She apparently landed on her left hip and side. She complains of  left hip pain and is unable to bear weight on the left lower extremity due to the hip pain. She reported some lower back pain but denied any other injuries. She denied any loss of consciousness. She was ambulating with a walker prior to the fall.  Past Medical History:  Diagnosis Date  . Atrial fibrillation ([place])   . Bronchitis   . Cancer ([place])    Left leg growth, kidneys, lungs and breasts  . Carcinoma of unknown primary ([place])   . COPD (chronic obstructive pulmonary disease) ([place])   . Diabetes mellitus without complication ([place])   . Hyperlipidemia   . Hypertension   . Pancreatitis   . Pneumonia   . Stroke ([place])    TIA's  . Vitamin B12 deficiency    Past Surgical History:  Procedure Laterality Date  . ABDOMINAL HYSTERECTOMY  1975  . BLADDER SUSPENSION  1989  . COLONOSCOPY    . COLONOSCOPY WITH PROPOFOL N/A 01/25/2018   Procedure: COLONOSCOPY WITH PROPOFOL;  Surgeon: [name], MD;  Location: [facility] ENDOSCOPY;  Service: Endoscopy;  Laterality: N/A;  . ESOPHAGOGASTRODUODENOSCOPY (EGD) WITH PROPOFOL N/A 01/24/2018   Procedure: ESOPHAGOGASTRODUODENOSCOPY (EGD) WITH PROPOFOL;  Surgeon: [name], MD;  Location: [facility] ENDOSCOPY;  Service: Endoscopy;  Laterality: N/A;   Social History   Socioeconomic History  . Marital status: Divorced    Spouse name: Not on file  . Number of children: Not on file  . Years of education: Not on file  . Highest education level: Not on file  Occupational History  . Not on file  Social Needs  . Financial resource strain: Not on file  . Food insecurity    Worry: Not on file    Inability: Not on file  . Transportation needs    Medical: Not  on file    Non-medical: Not on file  Tobacco Use  . Smoking status: Former Smoker    Packs/day: 0.50    Years: 15.00    Pack years: 7.50    Types: Cigarettes    Quit date: 11/15/1988    Years since quitting: 30.5  . Smokeless tobacco: Never Used  Substance and Sexual Activity  . Alcohol use: No    Alcohol/week: 0.0 standard drinks  . Drug use: No  . Sexual activity: Not on file    Comment: hysterectomy   Lifestyle  . Physical activity    Days per week: Not on file    Minutes per session: Not on file  . Stress: Not on file  Relationships  . Social Herbalist on phone: Not on file    Gets together: Not on file    Attends religious service: Not on file    Active member of club or organization: Not on file    Attends meetings of clubs or organizations: Not on file    Relationship status: Not on file  Other Topics Concern  . Not on file  Social History Narrative  . Not on file   Family History  Problem Relation Age of Onset  . Cancer Father   . Diabetes Brother    Allergies  Allergen Reactions  . Sulfa Antibiotics Swelling  .  Celecoxib Nausea And Vomiting  . Acetaminophen Itching  . Codeine Rash  . Lyrica [Pregabalin] Rash  . Penicillin G Rash    Has patient had a PCN reaction causing immediate rash, facial/tongue/throat swelling, SOB or lightheadedness with hypotension: Yes Has patient had a PCN reaction causing severe rash involving mucus membranes or skin necrosis: No Has patient had a PCN reaction that required hospitalization: No Has patient had a PCN reaction occurring within the last 10 years: Unknown If all of the above answers are "NO", then may proceed with Cephalosporin use.  Marland Kitchen Petrolatum-Zinc Oxide Rash   Prior to Admission medications   Medication Sig Start Date End Date Taking? Authorizing Provider  albuterol (VENTOLIN HFA) 108 (90 Base) MCG/ACT inhaler Inhale 2 puffs into the lungs every 4 (four) hours as needed for wheezing or shortness of  breath. 03/06/19   Boscia, Heather E, NP  amLODipine-benazepril (LOTREL) 5-20 MG capsule TAKE 1 CAPSULE BY MOUTH EVERY DAY 02/16/19   [name], NP  b complex vitamins capsule Take 1 capsule by mouth daily.    [provider]  desloratadine (CLARINEX) 5 MG tablet Take 1 tablet (5 mg total) by mouth daily. 02/16/19   [name], NP  famotidine (PEPCID) 20 MG tablet Take 1 tablet (20 mg total) by mouth 2 (two) times daily. 08/11/16   Epifanio Lesches, MD  fluticasone furoate-vilanterol (BREO ELLIPTA) 100-25 MCG/INH AEPB TAKE 1 PUFF ONCE A DAY 04/03/19   [name], NP  furosemide (LASIX) 20 MG tablet Take 20 mg by mouth daily.  05/24/16   [provider]  gabapentin (NEURONTIN) 300 MG capsule Take 2 capsules (600 mg total) by mouth at bedtime. 02/16/19   [name], NP  glipiZIDE (GLUCOTROL XL) 2.5 MG 24 hr tablet Take 1 tablet (2.5 mg total) by mouth daily. 04/18/19   [name], NP  hydrochlorothiazide (HYDRODIURIL) 25 MG tablet Take 1 tablet (25 mg total) by mouth daily. 05/17/19   [name], NP  Liniments (SALONPAS PAIN RELIEF PATCH EX) Apply topically.    [provider]  Magnesium 200 MG TABS Take 1 tablet (200 mg total) by mouth daily. 05/07/19   [name], NP  metFORMIN (GLUCOPHAGE-XR) 500 MG 24 hr tablet Take 2 tablets (1,000 mg total) by mouth daily. 01/22/19   [name], NP  montelukast (SINGULAIR) 10 MG tablet Take 10 mg by mouth at bedtime.    [provider]  ONE TOUCH ULTRA TEST test strip TEST ONCE A DAY AS DIRECTED 01/30/19   [name], NP  oxybutynin (DITROPAN-XL) 10 MG 24 hr tablet Take 1 tablet (10 mg total) by mouth daily. 01/22/19   [name], NP  pravastatin (PRAVACHOL) 40 MG tablet Take 1 tablet (40 mg total) by mouth daily. 01/22/19   [name], NP  theophylline (THEO-24) 100 MG 24 hr capsule TAKE 1 CAPSULE BY MOUTH EVERY DAY 04/18/19   [name], NP  triamcinolone  (KENALOG) 0.025 % cream Apply 1 application topically 2 (two) times daily. Ok to mix with topical lotion and apply to all affected areas. 05/07/19   [name], NP  Umeclidinium-Vilanterol (ANORO ELLIPTA) 62.5-25 MCG/INH AEPB Inhale 1 puff into the lungs daily.  12/24/14   [provider]  warfarin (COUMADIN) 4 MG tablet TAKE ONE TABLET EVERY DAY EXCEPT MONDAY TAKE 1/2 TABLET 02/16/19   [name], NP   Dg Lumbar Spine Complete  Result Date: 05/31/2019  CLINICAL DATA:  Back pain after fall EXAM: LUMBAR SPINE - COMPLETE 4+ VIEW COMPARISON:  12/12/2013 abdominal CT FINDINGS: Negative for acute fracture or traumatic malalignment. Generalized facet degeneration with slight L4-5 anterolisthesis. Mild lumbar levocurvature. Generalized osteopenia. IMPRESSION: No acute finding. Electronically Signed   By: Monte Fantasia M.D.   On: 05/31/2019 04:24   Dg Hip Unilat W Or Wo Pelvis 2-3 Views Left  Result Date: 05/31/2019 CLINICAL DATA:  Fall with left hip pain EXAM: DG HIP (WITH OR WITHOUT PELVIS) 2-3V LEFT COMPARISON:  None. FINDINGS: Subcapital left femoral neck fracture with dorsal and lateral impaction. No dislocation. Generalized osteopenia.  No notable acetabular spurring. IMPRESSION: Impacted left subcapital femoral neck fracture. Electronically Signed   By: Monte Fantasia M.D.   On: 05/31/2019 04:21    Positive ROS: All other systems have been reviewed and were otherwise negative with the exception of those mentioned in the HPI and as above.  Physical Exam: General: Well developed, well nourished female seen in no acute distress. HEENT: Atraumatic and normocephalic. Sclera are clear. Extraocular motion is intact. Oropharynx is clear with moist mucosa. Neck: Supple, nontender, good range of motion.  Lungs: Clear to auscultation bilaterally. Cardiovascular: Regular rate and rhythm with normal S1 and S2. No murmurs. No gallops or rubs. Pedal pulses are palpable bilaterally. Homans  test is negative bilaterally. No significant pretibial or ankle edema. Abdomen: Soft, nontender, and nondistended. Bowel sounds are present. Skin: No lesions in the area of chief complaint Neurologic: Awake, alert, and oriented. Sensory function is grossly intact. Motor strength is felt to be 5 over 5 bilaterally. No clonus or tremor. Good motor coordination. Lymphatic: No axillary or cervical lymphadenopathy  MUSCULOSKELETAL: Examination of the left lower extremity demonstrates slight shortening and rotation. Pain is elicited with attempted range of motion of the left hip. No tenderness to palpation of the left knee. No knee effusion. No tenderness or swelling to the left ankle.  Assessment: Left femoral neck fracture   Plan: The findings were discussed in detail with the patient. Recommendation was made for left hip hemiarthroplasty. The usual perioperative course was discussed. The risks and benefits of surgical intervention were reviewed. The patient expressed understanding of the risks and benefits and agreed with plans for surgical intervention.   The surgical site was signed as per the "right site surgery" protocol.   The patient was anticoagulated with coumadin. INR this morning was 2.4. I would like to see normalization of the INR before surgery with a goal of 1.3 (to allow for the possibility of regional anesthesia and lower risk of blood loss).  Rockford Leinen P. Holley Bouche M.D.

## 2019-05-31 NOTE — ED Notes (Signed)
Pt states she fell while doing laundry tonight, pt states she went to turn a light on and her walker got away from her. Pt is alert and oriented at this time. C/o left hip pain radiating through low back. Pt has mask on.

## 2019-05-31 NOTE — Progress Notes (Signed)
Pt is grimacing and crying, stated "this is the worst pain ever". Complains of 10/10 pain on the left hip radiating on the lower back. Pt given Oxycodone 5mg  every 4hrs as ordered at 2014, repositioned and ice pack applied on the left hip. Pt claimed "the medicine is not helping at all". Paged on call Dr. Roland Rack and ordered Tramadol and Tylenol, however pt had history of allergic reaction to Acetaminophen and Codeine. Called back Dr Roland Rack and ordered to keep the Tramadol order of 50mg  every 6hrs PRN and change Oxycodone 5mg  from every 4hrs to every 3hrs PRN. Will administer ordered PRN medicines and continue to monitor.

## 2019-05-31 NOTE — ED Triage Notes (Signed)
Pt arrived via EMS from [facility] where pt had fallen last night, EMS reported but pt refused. Pt started to have left hip pain that radiates into lower back. Pt to ED for evaluation of possible injuries. Pt A&O x4. Pt denies LOC, denies hitting head.

## 2019-05-31 NOTE — ED Notes (Signed)
ED TO INPATIENT HANDOFF REPORT  ED Nurse Name and Phone #:  Laban Emperor Name/Age/Gender Monica [name] 85 y.o. female Room/Bed: ED16A/ED16A  Code Status   Code Status: Full Code  Home/SNF/Other Nursing Home Patient oriented to: self and place Is this baseline? Yes   Triage Complete: Triage complete  Chief Complaint fall  Triage Note Pt arrived via EMS from [facility] where pt had fallen last night, EMS reported but pt refused. Pt started to have left hip pain that radiates into lower back. Pt to ED for evaluation of possible injuries. Pt A&O x4. Pt denies LOC, denies hitting head.    Allergies Allergies  Allergen Reactions  . Sulfa Antibiotics Swelling  . Celecoxib Nausea And Vomiting  . Acetaminophen Itching  . Codeine Rash  . Lyrica [Pregabalin] Rash  . Penicillin G Rash    Has patient had a PCN reaction causing immediate rash, facial/tongue/throat swelling, SOB or lightheadedness with hypotension: Yes Has patient had a PCN reaction causing severe rash involving mucus membranes or skin necrosis: No Has patient had a PCN reaction that required hospitalization: No Has patient had a PCN reaction occurring within the last 10 years: Unknown If all of the above answers are "NO", then may proceed with Cephalosporin use.  Marland Kitchen Petrolatum-Zinc Oxide Rash    Level of Care/Admitting Diagnosis ED Disposition    ED Disposition Condition [facility] Area: [place] [[id number]]  Level of Care: Med-Surg [16]  Covid Evaluation: Confirmed COVID Negative  Diagnosis: Closed left hip fracture [facility]) [[id number]]  Admitting Physician: Christel Mormon [[id number]]  Attending Physician: Christel Mormon [[id number]]  Estimated length of stay: 3 - 4 days  Certification:: I certify this patient will need inpatient services for at least 2 midnights  PT Class (Do Not Modify): Inpatient [101]  PT Acc Code (Do Not Modify): Private [1]       B Medical/Surgery History Past  Medical History:  Diagnosis Date  . Atrial fibrillation ([place])   . Bronchitis   . Cancer ([place])    Left leg growth, kidneys, lungs and breasts  . Carcinoma of unknown primary ([place])   . COPD (chronic obstructive pulmonary disease) ([place])   . Diabetes mellitus without complication ([place])   . Hyperlipidemia   . Hypertension   . Pancreatitis   . Pneumonia   . Stroke ([place])    TIA's  . Vitamin B12 deficiency    Past Surgical History:  Procedure Laterality Date  . ABDOMINAL HYSTERECTOMY  1975  . BLADDER SUSPENSION  1989  . COLONOSCOPY    . COLONOSCOPY WITH PROPOFOL N/A 01/25/2018   Procedure: COLONOSCOPY WITH PROPOFOL;  Surgeon: [name], MD;  Location: [facility] ENDOSCOPY;  Service: Endoscopy;  Laterality: N/A;  . ESOPHAGOGASTRODUODENOSCOPY (EGD) WITH PROPOFOL N/A 01/24/2018   Procedure: ESOPHAGOGASTRODUODENOSCOPY (EGD) WITH PROPOFOL;  Surgeon: [name], MD;  Location: [facility] ENDOSCOPY;  Service: Endoscopy;  Laterality: N/A;     A IV Location/Drains/Wounds Patient Lines/Drains/Airways Status   Active Line/Drains/Airways    Name:   Placement date:   Placement time:   Site:   Days:   Peripheral IV 05/31/19 Right Antecubital   05/31/19    0346    Antecubital   less than 1          Intake/Output Last 24 hours No intake or output data in the 24 hours ending 05/31/19 0543  Labs/Imaging Results for orders placed or performed during the hospital encounter of 05/31/19 (from the  past 48 hour(s))  CBC     Status: Abnormal   Collection Time: 05/31/19  3:53 AM  Result Value Ref Range   WBC 18.1 (H) 4.0 - 10.5 K/uL   RBC 4.60 3.87 - 5.11 MIL/uL   Hemoglobin 12.5 12.0 - 15.0 g/dL   HCT 37.3 36.0 - 46.0 %   MCV 81.1 80.0 - 100.0 fL   MCH 27.2 26.0 - 34.0 pg   MCHC 33.5 30.0 - 36.0 g/dL   RDW 13.9 11.5 - 15.5 %   Platelets 287 150 - 400 K/uL   nRBC 0.0 0.0 - 0.2 %    Comment: Performed at [facility], [address] [place], [place] [address]  Comprehensive metabolic panel      Status: Abnormal   Collection Time: 05/31/19  3:53 AM  Result Value Ref Range   Sodium 126 (L) 135 - 145 mmol/L   Potassium 4.0 3.5 - 5.1 mmol/L   Chloride 92 (L) 98 - 111 mmol/L   CO2 24 22 - 32 mmol/L   Glucose, Bld 170 (H) 70 - 99 mg/dL   BUN 16 8 - 23 mg/dL   Creatinine, Ser 0.79 0.44 - 1.00 mg/dL   Calcium 9.4 8.9 - 10.3 mg/dL   Total Protein 6.8 6.5 - 8.1 g/dL   Albumin 3.9 3.5 - 5.0 g/dL   AST 31 15 - 41 U/L   ALT 21 0 - 44 U/L   Alkaline Phosphatase 103 38 - 126 U/L   Total Bilirubin 1.1 0.3 - 1.2 mg/dL   GFR calc non Af Amer >60 >60 mL/min   GFR calc Af Amer >60 >60 mL/min   Anion gap 10 5 - 15    Comment: Performed at [facility], [address] [place], [place] [address]  Protime-INR     Status: Abnormal   Collection Time: 05/31/19  3:53 AM  Result Value Ref Range   Prothrombin Time 25.6 (H) 11.4 - 15.2 seconds   INR 2.4 (H) 0.8 - 1.2    Comment: (NOTE) INR goal varies based on device and disease states. Performed at [facility], [address] [place], [place] [address]    Dg Lumbar Spine Complete  Result Date: 05/31/2019 CLINICAL DATA:  Back pain after fall EXAM: LUMBAR SPINE - COMPLETE 4+ VIEW COMPARISON:  12/12/2013 abdominal CT FINDINGS: Negative for acute fracture or traumatic malalignment. Generalized facet degeneration with slight L4-5 anterolisthesis. Mild lumbar levocurvature. Generalized osteopenia. IMPRESSION: No acute finding. Electronically Signed   By: Monte Fantasia M.D.   On: 05/31/2019 04:24   Dg Hip Unilat W Or Wo Pelvis 2-3 Views Left  Result Date: 05/31/2019 CLINICAL DATA:  Fall with left hip pain EXAM: DG HIP (WITH OR WITHOUT PELVIS) 2-3V LEFT COMPARISON:  None. FINDINGS: Subcapital left femoral neck fracture with dorsal and lateral impaction. No dislocation. Generalized osteopenia.  No notable acetabular spurring. IMPRESSION: Impacted left subcapital femoral neck fracture. Electronically Signed   By: Monte Fantasia  M.D.   On: 05/31/2019 04:21    Pending Labs Unresulted Labs (From admission, onward)    Start     Ordered   06/01/19 6384  Basic metabolic panel  Tomorrow morning,   STAT     05/31/19 0514   06/01/19 0500  CBC  Tomorrow morning,   STAT     05/31/19 0514          Vitals/Pain Today's Vitals   05/31/19 0340 05/31/19 0355 05/31/19 0423  BP:  112/67  Pulse: (!) 110  98  Resp: 20  18  Temp: 98.3 F (36.8 C)    TempSrc: Oral    SpO2: 92%  92%  Weight: 71.2 kg    Height: 5\' 4"  (1.626 m)    PainSc: 9  7      Isolation Precautions No active isolations  Medications Medications  amLODipine-benazepril (LOTREL) 5-20 MG per capsule 1 capsule (has no administration in time range)  furosemide (LASIX) tablet 20 mg (has no administration in time range)  hydrochlorothiazide (HYDRODIURIL) tablet 25 mg (has no administration in time range)  pravastatin (PRAVACHOL) tablet 40 mg (has no administration in time range)  famotidine (PEPCID) tablet 20 mg (has no administration in time range)  oxybutynin (DITROPAN-XL) 24 hr tablet 10 mg (has no administration in time range)  gabapentin (NEURONTIN) capsule 600 mg (has no administration in time range)  b complex vitamins capsule 1 capsule (has no administration in time range)  Magnesium TABS 200 mg (has no administration in time range)  albuterol (VENTOLIN HFA) 108 (90 Base) MCG/ACT inhaler 2 puff (has no administration in time range)  loratadine (CLARITIN) tablet 10 mg (has no administration in time range)  montelukast (SINGULAIR) tablet 10 mg (has no administration in time range)  theophylline (THEO-24) 24 hr capsule 100 mg (has no administration in time range)  insulin aspart (novoLOG) injection 0-9 Units (has no administration in time range)  0.9 %  sodium chloride infusion (has no administration in time range)  traZODone (DESYREL) tablet 25 mg (has no administration in time range)  oxyCODONE (Oxy IR/ROXICODONE) immediate release tablet 5 mg  (has no administration in time range)  ondansetron (ZOFRAN) tablet 4 mg (has no administration in time range)    Or  ondansetron (ZOFRAN) injection 4 mg (has no administration in time range)  magnesium hydroxide (MILK OF MAGNESIA) suspension 30 mL (has no administration in time range)  morphine 2 MG/ML injection 2 mg (has no administration in time range)  morphine 2 MG/ML injection 2 mg (2 mg Intravenous Given 05/31/19 0350)  ondansetron (ZOFRAN) injection 4 mg (4 mg Intravenous Given 05/31/19 0350)    Mobility non-ambulatory Moderate fall risk   Focused Assessments hip fx   R Recommendations: See Admitting Provider Note  Report given to:   Additional Notes:

## 2019-05-31 NOTE — H&P (Signed)
[place] [facility] [place] NAME: Monica [name]    MR#:  [id number]  DATE OF BIRTH:  04-25-1926  DATE OF ADMISSION:  05/31/2019  PRIMARY CARE PHYSICIAN: [name], NP   REQUESTING/REFERRING PHYSICIAN: Marjean Donna, MD  CHIEF COMPLAINT:   Chief Complaint  Patient presents with  . Fall    HISTORY OF PRESENT ILLNESS:  Monica [name]  is a 85 y.o. Caucasian female with a known history of multiple medical problems that will be mentioned below, who presented emergency room with onset of accidental mechanical fall with subsequent left hip pain.  The patient was apparently reaching up to get something and while bending down she lost her balance.  No chest pain or dyspnea or palpitations, headache or dizziness or blurred vision, paresthesias or focal muscle weakness before or after her fall.  No presyncope or syncope.  Upon presentation to the emergency room, vital signs showed heart rate of 110 with otherwise normal vital signs.  Labs revealed hyponatremia and hypochloremia with glucose of 170, leukocytosis of 18.1.  INR was 2.4 on Coumadin with PT of 25.6.  Left hip x-ray showed impacted left subcapital femoral neck fracture.  Due to back pain the patient had lumbar spine CT scan that showed no acute findings.  Her COVID-19 test is currently pending.  The patient was given 4 mg of IV Zofran and 2 mg of IV morphine sulfate.  She will be admitted to surgical bed for further evaluation and management.  PAST MEDICAL HISTORY:   Past Medical History:  Diagnosis Date  . Atrial fibrillation ([place])   . Bronchitis   . Cancer ([place])    Left leg growth, kidneys, lungs and breasts  . Carcinoma of unknown primary ([place])   . COPD (chronic obstructive pulmonary disease) ([place])   . Diabetes mellitus without complication ([place])   . Hyperlipidemia   . Hypertension   . Pancreatitis   . Pneumonia   . Stroke ([place])    TIA's  . Vitamin B12 deficiency     PAST  SURGICAL HISTORY:   Past Surgical History:  Procedure Laterality Date  . ABDOMINAL HYSTERECTOMY  1975  . BLADDER SUSPENSION  1989  . COLONOSCOPY    . COLONOSCOPY WITH PROPOFOL N/A 01/25/2018   Procedure: COLONOSCOPY WITH PROPOFOL;  Surgeon: [name], MD;  Location: [facility] ENDOSCOPY;  Service: Endoscopy;  Laterality: N/A;  . ESOPHAGOGASTRODUODENOSCOPY (EGD) WITH PROPOFOL N/A 01/24/2018   Procedure: ESOPHAGOGASTRODUODENOSCOPY (EGD) WITH PROPOFOL;  Surgeon: [name], MD;  Location: [facility] ENDOSCOPY;  Service: Endoscopy;  Laterality: N/A;    SOCIAL HISTORY:   Social History   Tobacco Use  . Smoking status: Former Smoker    Packs/day: 0.50    Years: 15.00    Pack years: 7.50    Types: Cigarettes    Quit date: 11/15/1988    Years since quitting: 30.5  . Smokeless tobacco: Never Used  Substance Use Topics  . Alcohol use: No    Alcohol/week: 0.0 standard drinks    FAMILY HISTORY:   Family History  Problem Relation Age of Onset  . Cancer Father   . Diabetes Brother     DRUG ALLERGIES:   Allergies  Allergen Reactions  . Sulfa Antibiotics Swelling  . Celecoxib Nausea And Vomiting  . Acetaminophen Itching  . Codeine Rash  . Lyrica [Pregabalin] Rash  . Penicillin G Rash    Has patient had a PCN reaction causing immediate rash, facial/tongue/throat swelling, SOB or lightheadedness  with hypotension: Yes Has patient had a PCN reaction causing severe rash involving mucus membranes or skin necrosis: No Has patient had a PCN reaction that required hospitalization: No Has patient had a PCN reaction occurring within the last 10 years: Unknown If all of the above answers are "NO", then may proceed with Cephalosporin use.  Marland Kitchen Petrolatum-Zinc Oxide Rash    REVIEW OF SYSTEMS:   ROS As per history of present illness. All pertinent systems were reviewed above. Constitutional,  HEENT, cardiovascular, respiratory, GI, GU, musculoskeletal, neuro, psychiatric, endocrine,  integumentary  and hematologic systems were reviewed and are otherwise  negative/unremarkable except for positive findings mentioned above in the HPI.   MEDICATIONS AT HOME:   Prior to Admission medications   Medication Sig Start Date End Date Taking? Authorizing Provider  albuterol (VENTOLIN HFA) 108 (90 Base) MCG/ACT inhaler Inhale 2 puffs into the lungs every 4 (four) hours as needed for wheezing or shortness of breath. 03/06/19   Boscia, Heather E, NP  amLODipine-benazepril (LOTREL) 5-20 MG capsule TAKE 1 CAPSULE BY MOUTH EVERY DAY 02/16/19   [name], NP  b complex vitamins capsule Take 1 capsule by mouth daily.    [provider]  desloratadine (CLARINEX) 5 MG tablet Take 1 tablet (5 mg total) by mouth daily. 02/16/19   [name], NP  famotidine (PEPCID) 20 MG tablet Take 1 tablet (20 mg total) by mouth 2 (two) times daily. 08/11/16   Epifanio Lesches, MD  fluticasone furoate-vilanterol (BREO ELLIPTA) 100-25 MCG/INH AEPB TAKE 1 PUFF ONCE A DAY 04/03/19   [name], NP  furosemide (LASIX) 20 MG tablet Take 20 mg by mouth daily.  05/24/16   [provider]  gabapentin (NEURONTIN) 300 MG capsule Take 2 capsules (600 mg total) by mouth at bedtime. 02/16/19   [name], NP  glipiZIDE (GLUCOTROL XL) 2.5 MG 24 hr tablet Take 1 tablet (2.5 mg total) by mouth daily. 04/18/19   [name], NP  hydrochlorothiazide (HYDRODIURIL) 25 MG tablet Take 1 tablet (25 mg total) by mouth daily. 05/17/19   [name], NP  Liniments (SALONPAS PAIN RELIEF PATCH EX) Apply topically.    [provider]  Magnesium 200 MG TABS Take 1 tablet (200 mg total) by mouth daily. 05/07/19   [name], NP  metFORMIN (GLUCOPHAGE-XR) 500 MG 24 hr tablet Take 2 tablets (1,000 mg total) by mouth daily. 01/22/19   [name], NP  montelukast (SINGULAIR) 10 MG tablet Take 10 mg by mouth at bedtime.    [provider]  ONE TOUCH ULTRA TEST test strip TEST ONCE A  DAY AS DIRECTED 01/30/19   [name], NP  oxybutynin (DITROPAN-XL) 10 MG 24 hr tablet Take 1 tablet (10 mg total) by mouth daily. 01/22/19   [name], NP  pravastatin (PRAVACHOL) 40 MG tablet Take 1 tablet (40 mg total) by mouth daily. 01/22/19   [name], NP  theophylline (THEO-24) 100 MG 24 hr capsule TAKE 1 CAPSULE BY MOUTH EVERY DAY 04/18/19   [name], NP  triamcinolone (KENALOG) 0.025 % cream Apply 1 application topically 2 (two) times daily. Ok to mix with topical lotion and apply to all affected areas. 05/07/19   [name], NP  Umeclidinium-Vilanterol (ANORO ELLIPTA) 62.5-25 MCG/INH AEPB Inhale 1 puff into the lungs daily.  12/24/14   [provider]  warfarin (COUMADIN) 4 MG tablet TAKE ONE TABLET EVERY DAY EXCEPT MONDAY TAKE  1/2 TABLET 02/16/19   [name], NP      VITAL SIGNS:  Blood pressure 112/67, pulse 98, temperature 98.3 F (36.8 C), temperature source Oral, resp. rate 18, height 5\' 4"  (1.626 m), weight 71.2 kg, SpO2 92 %.  PHYSICAL EXAMINATION:  Physical Exam  GENERAL:  85 y.o.-year-old Caucasian female patient lying in the bed with no acute distress.  EYES: Pupils equal, round, reactive to light and accommodation. No scleral icterus. Extraocular muscles intact.  HEENT: Head atraumatic, normocephalic. Oropharynx and nasopharynx clear.  NECK:  Supple, no jugular venous distention. No thyroid enlargement, no tenderness.  LUNGS: Normal breath sounds bilaterally, no wheezing, rales,rhonchi or crepitation. No use of accessory muscles of respiration.  CARDIOVASCULAR: Regular rate and rhythm, S1, S2 normal. No murmurs, rubs, or gallops.  ABDOMEN: Soft, nondistended, nontender. Bowel sounds present. No organomegaly or mass.  EXTREMITIES: No pedal edema, cyanosis, or clubbing.  NEUROLOGIC: Cranial nerves II through XII are intact. Muscle strength 5/5 in all extremities. Sensation intact. Gait not checked. Musculoskeletal: Left hip  tenderness. PSYCHIATRIC: The patient is alert and oriented x 3.  Normal affect and good eye contact. SKIN: No obvious rash, lesion, or ulcer.   LABORATORY PANEL:   CBC Recent Labs  Lab 05/31/19 0353  WBC 18.1*  HGB 12.5  HCT 37.3  PLT 287   ------------------------------------------------------------------------------------------------------------------  Chemistries  Recent Labs  Lab 05/31/19 0353  NA 126*  K 4.0  CL 92*  CO2 24  GLUCOSE 170*  BUN 16  CREATININE 0.79  CALCIUM 9.4  AST 31  ALT 21  ALKPHOS 103  BILITOT 1.1   ------------------------------------------------------------------------------------------------------------------  Cardiac Enzymes No results for input(s): TROPONINI in the last 168 hours. ------------------------------------------------------------------------------------------------------------------  RADIOLOGY:  Dg Lumbar Spine Complete  Result Date: 05/31/2019 CLINICAL DATA:  Back pain after fall EXAM: LUMBAR SPINE - COMPLETE 4+ VIEW COMPARISON:  12/12/2013 abdominal CT FINDINGS: Negative for acute fracture or traumatic malalignment. Generalized facet degeneration with slight L4-5 anterolisthesis. Mild lumbar levocurvature. Generalized osteopenia. IMPRESSION: No acute finding. Electronically Signed   By: Monte Fantasia M.D.   On: 05/31/2019 04:24   Dg Hip Unilat W Or Wo Pelvis 2-3 Views Left  Result Date: 05/31/2019 CLINICAL DATA:  Fall with left hip pain EXAM: DG HIP (WITH OR WITHOUT PELVIS) 2-3V LEFT COMPARISON:  None. FINDINGS: Subcapital left femoral neck fracture with dorsal and lateral impaction. No dislocation. Generalized osteopenia.  No notable acetabular spurring. IMPRESSION: Impacted left subcapital femoral neck fracture. Electronically Signed   By: Monte Fantasia M.D.   On: 05/31/2019 04:21      IMPRESSION AND PLAN:   1.  Left hip fracture secondary to mechanical fall.  The patient will be admitted to a surgical bed.  Pain  management will be provided.  An orthopedic consultation will be obtained.  Dr. Marry Guan was notified about the patient.  We will go ahead and hold her Coumadin for now.  Will follow her INR.  2.  COPD.  No current exacerbation.  We will continue theophylline for now and place the patient on PRN duo nebs.  3.  Hypertension.  We will continue Lotrel and HCTZ  4.  Type II diabetes mellitus.  The patient will be placed on supplemental coverage with NovoLog.  5.   Atrial fibrillation.  We will holding off Coumadin at this time.  6.  DVT prophylaxis.  SCDs.  Coumadin is being held off.     All the records are reviewed and case discussed with ED  provider. The plan of care was discussed in details with the patient (and family). I answered all questions. The patient agreed to proceed with the above mentioned plan. Further management will depend upon hospital course.   CODE STATUS: Full code  TOTAL TIME TAKING CARE OF THIS PATIENT: 50 minutes.    Christel Mormon M.D on 05/31/2019 at 5:15 AM  Pager - [phone]  After 6pm go to [email or web address] - Proofreader  [facility]  Office  [phone]  CC: Primary care physician; [name], NP   Note: This dictation was prepared with Dragon dictation along with smaller phrase technology. Any transcriptional errors that result from this process are unintentional.

## 2019-05-31 NOTE — Progress Notes (Addendum)
[place] [facility] [place] NAME: Monica [name]    MR#:  [id number]  DATE OF BIRTH:  06/08/1926  SUBJECTIVE:  CHIEF COMPLAINT: Complaining of left hip pain , INR 2.4  REVIEW OF SYSTEMS:  CONSTITUTIONAL: No fever, fatigue or weakness.  EYES: No blurred or double vision.  EARS, NOSE, AND THROAT: No tinnitus or ear pain.  RESPIRATORY: No cough, shortness of breath, wheezing or hemoptysis.  CARDIOVASCULAR: No chest pain, orthopnea, edema.  GASTROINTESTINAL: No nausea, vomiting, diarrhea or abdominal pain.  GENITOURINARY: No dysuria, hematuria.  ENDOCRINE: No polyuria, nocturia,  HEMATOLOGY: No anemia, easy bruising or bleeding SKIN: No rash or lesion. MUSCULOSKELETAL: Reporting left hip pain no joint pain or arthritis.   NEUROLOGIC: No tingling, numbness, weakness.  PSYCHIATRY: No anxiety or depression.   DRUG ALLERGIES:   Allergies  Allergen Reactions  . Sulfa Antibiotics Swelling  . Celecoxib Nausea And Vomiting  . Acetaminophen Itching  . Codeine Rash  . Lyrica [Pregabalin] Rash  . Penicillin G Rash    Has patient had a PCN reaction causing immediate rash, facial/tongue/throat swelling, SOB or lightheadedness with hypotension: Yes Has patient had a PCN reaction causing severe rash involving mucus membranes or skin necrosis: No Has patient had a PCN reaction that required hospitalization: No Has patient had a PCN reaction occurring within the last 10 years: Unknown If all of the above answers are "NO", then may proceed with Cephalosporin use.  Marland Kitchen Petrolatum-Zinc Oxide Rash    VITALS:  Blood pressure (!) 154/65, pulse 79, temperature 98 F (36.7 C), temperature source Oral, resp. rate 18, height 5\' 4"  (1.626 m), weight 71.2 kg, SpO2 100 %.  PHYSICAL EXAMINATION:  GENERAL:  85 y.o.-year-old patient lying in the bed with no acute distress.  EYES: Pupils equal, round, reactive to light and accommodation. No scleral icterus.  Extraocular muscles intact.  HEENT: Head atraumatic, normocephalic. Oropharynx and nasopharynx clear.  NECK:  Supple, no jugular venous distention. No thyroid enlargement, no tenderness.  LUNGS: Normal breath sounds bilaterally, no wheezing, rales,rhonchi or crepitation. No use of accessory muscles of respiration.  CARDIOVASCULAR: S1, S2 normal. No murmurs, rubs, or gallops.  ABDOMEN: Soft, nontender, nondistended. Bowel sounds present. EXTREMITIES: Left hip is tender and externally rotated no pedal edema, cyanosis, or clubbing.  NEUROLOGIC: Cranial nerves II through XII are intact.  Sensation intact. Gait not checked for safety PSYCHIATRIC: The patient is alert and oriented x 3.  SKIN: No obvious rash, lesion, or ulcer.    LABORATORY PANEL:   CBC Recent Labs  Lab 05/31/19 0353  WBC 18.1*  HGB 12.5  HCT 37.3  PLT 287   ------------------------------------------------------------------------------------------------------------------  Chemistries  Recent Labs  Lab 05/31/19 0353  NA 126*  K 4.0  CL 92*  CO2 24  GLUCOSE 170*  BUN 16  CREATININE 0.79  CALCIUM 9.4  AST 31  ALT 21  ALKPHOS 103  BILITOT 1.1   ------------------------------------------------------------------------------------------------------------------  Cardiac Enzymes No results for input(s): TROPONINI in the last 168 hours. ------------------------------------------------------------------------------------------------------------------  RADIOLOGY:  Dg Lumbar Spine Complete  Result Date: 05/31/2019 CLINICAL DATA:  Back pain after fall EXAM: LUMBAR SPINE - COMPLETE 4+ VIEW COMPARISON:  12/12/2013 abdominal CT FINDINGS: Negative for acute fracture or traumatic malalignment. Generalized facet degeneration with slight L4-5 anterolisthesis. Mild lumbar levocurvature. Generalized osteopenia. IMPRESSION: No acute finding. Electronically Signed   By: Monte Fantasia M.D.   On: 05/31/2019 04:24   Dg Hip  Unilat W Or Wo Pelvis 2-3  Views Left  Result Date: 05/31/2019 CLINICAL DATA:  Fall with left hip pain EXAM: DG HIP (WITH OR WITHOUT PELVIS) 2-3V LEFT COMPARISON:  None. FINDINGS: Subcapital left femoral neck fracture with dorsal and lateral impaction. No dislocation. Generalized osteopenia.  No notable acetabular spurring. IMPRESSION: Impacted left subcapital femoral neck fracture. Electronically Signed   By: Monte Fantasia M.D.   On: 05/31/2019 04:21    EKG:   Orders placed or performed during the hospital encounter of 05/31/19  . EKG 12-Lead  . EKG 12-Lead    ASSESSMENT AND PLAN:   1.  Left hip fracture secondary to mechanical fall.   Pain management as needed Orthopedics Dr. Marry Guan is consulted and are planning to do any surgery today or tomorrow in view of her INR being high Coumadin is on hold INR is at 2.4    2.  Hyponatremia- could be from hydrochlorothiazide and dehydration Hold hydrochlorothiazide and hydrate with IV fluids We will check random urine sodium and osmolarity Repeat labs.  Patient is mentating fine Sodium at 126 previously it was normal in 2019  3.  Hypertension.  We will continue Lotrel and hold HCTZ in view of hyponatremia  4.  Type II diabetes mellitus.  The patient will be placed on supplemental coverage with NovoLog.  5.   Atrial fibrillation.  We will holding off Coumadin at this time.  6.   COPD.  No current exacerbation.  We will continue theophylline for now and place the patient on PRN duo nebs.  DVT prophylaxis.  SCDs.  Coumadin is being held off.     All the records are reviewed and case discussed with Care Management/Social Workerr. Management plans discussed with the patient, susan -daughter and they are in agreement.  CODE STATUS: fc   TOTAL TIME TAKING CARE OF THIS PATIENT: 36  minutes.   POSSIBLE D/C IN 2-3 DAYS, DEPENDING ON CLINICAL CONDITION.  Note: This dictation was prepared with Dragon dictation along with smaller  phrase technology. Any transcriptional errors that result from this process are unintentional.   Nicholes Mango M.D on 05/31/2019 at 2:14 PM  Between 7am to 6pm - Pager - [phone] After 6pm go to [email or web address] - password [email or web address] [facility]  Office  [phone]  CC: Primary care physician; [name], NP

## 2019-05-31 NOTE — ED Notes (Signed)
Patient transported to X-ray 

## 2019-05-31 NOTE — Progress Notes (Signed)
Pt inr  2.0continue same med as per [employer]

## 2019-06-01 DIAGNOSIS — I10 Essential (primary) hypertension: Secondary | ICD-10-CM | POA: Diagnosis not present

## 2019-06-01 DIAGNOSIS — E119 Type 2 diabetes mellitus without complications: Secondary | ICD-10-CM | POA: Diagnosis not present

## 2019-06-01 DIAGNOSIS — S72002A Fracture of unspecified part of neck of left femur, initial encounter for closed fracture: Secondary | ICD-10-CM | POA: Diagnosis not present

## 2019-06-01 DIAGNOSIS — J449 Chronic obstructive pulmonary disease, unspecified: Secondary | ICD-10-CM | POA: Diagnosis not present

## 2019-06-01 LAB — GLUCOSE, CAPILLARY
Glucose-Capillary: 121 mg/dL — ABNORMAL HIGH (ref 70–99)
Glucose-Capillary: 133 mg/dL — ABNORMAL HIGH (ref 70–99)
Glucose-Capillary: 140 mg/dL — ABNORMAL HIGH (ref 70–99)
Glucose-Capillary: 172 mg/dL — ABNORMAL HIGH (ref 70–99)
Glucose-Capillary: 139 mg/dL — ABNORMAL HIGH (ref 70–99)
Glucose-Capillary: 209 mg/dL — ABNORMAL HIGH (ref 70–99)

## 2019-06-01 LAB — CBC
HCT: 41.1 % (ref 36.0–46.0)
Hemoglobin: 13 g/dL (ref 12.0–15.0)
MCH: 27 pg (ref 26.0–34.0)
MCHC: 31.6 g/dL (ref 30.0–36.0)
MCV: 85.4 fL (ref 80.0–100.0)
Platelets: 254 10*3/uL (ref 150–400)
RBC: 4.81 MIL/uL (ref 3.87–5.11)
RDW: 14.3 % (ref 11.5–15.5)
WBC: 13.6 10*3/uL — ABNORMAL HIGH (ref 4.0–10.5)
nRBC: 0 % (ref 0.0–0.2)

## 2019-06-01 LAB — BASIC METABOLIC PANEL
Anion gap: 9 (ref 5–15)
BUN: 17 mg/dL (ref 8–23)
CO2: 26 mmol/L (ref 22–32)
Calcium: 9.2 mg/dL (ref 8.9–10.3)
Chloride: 96 mmol/L — ABNORMAL LOW (ref 98–111)
Creatinine, Ser: 1.07 mg/dL — ABNORMAL HIGH (ref 0.44–1.00)
GFR calc Af Amer: 53 mL/min — ABNORMAL LOW (ref >60)
GFR calc non Af Amer: 45 mL/min — ABNORMAL LOW (ref >60)
Glucose, Bld: 147 mg/dL — ABNORMAL HIGH (ref 70–99)
Potassium: 5.3 mmol/L — ABNORMAL HIGH (ref 3.5–5.1)
Sodium: 131 mmol/L — ABNORMAL LOW (ref 135–145)

## 2019-06-01 LAB — PROTIME-INR
INR: 2.5 — ABNORMAL HIGH (ref 0.8–1.2)
Prothrombin Time: 26.6 s — ABNORMAL HIGH (ref 11.4–15.2)

## 2019-06-01 MED ORDER — MORPHINE SULFATE (PF) 2 MG/ML IV SOLN
2.0000 mg | INTRAVENOUS | Status: DC | PRN
  Administered 2019-06-01 – 2019-06-02 (×2): 2 mg via INTRAVENOUS
  Filled 2019-06-01 (×2): qty 1

## 2019-06-01 MED ORDER — SENNOSIDES-DOCUSATE SODIUM 8.6-50 MG PO TABS
1.0000 | ORAL_TABLET | Freq: Two times a day (BID) | ORAL | Status: DC
  Administered 2019-06-01 – 2019-06-09 (×16): 1 via ORAL
  Filled 2019-06-01 (×16): qty 1

## 2019-06-01 MED ORDER — AMLODIPINE BESYLATE 10 MG PO TABS
10.0000 mg | ORAL_TABLET | Freq: Every day | ORAL | Status: DC
  Administered 2019-06-01 – 2019-06-09 (×8): 10 mg via ORAL
  Filled 2019-06-01 (×8): qty 1

## 2019-06-01 MED ORDER — PATIROMER SORBITEX CALCIUM 8.4 G PO PACK
8.4000 g | PACK | Freq: Once | ORAL | Status: AC
  Administered 2019-06-01: 8.4 g via ORAL
  Filled 2019-06-01: qty 1

## 2019-06-01 NOTE — TOC Initial Note (Signed)
Transition of Care [facility]) - Initial/Assessment Note    Patient Details  Name: Monica [name] MRN: [id number] Date of Birth: 1926-03-05  [facility] CM/SW Contact:    Gaila Engebretsen, Lenice Llamas Phone Number: [phone]  06/01/2019, 2:58 PM  Clinical Narrative: Clinical Social Worker (CSW) attempted to meet with patient however she was asleep. CSW contacted patient's daughter [name] to complete assessment. Per [name] patient lives alone at [facility]. [name] reported that patient's daughter Enid Derry is HPOA however she was early onset dementia. Per [name] she would like for patient to go to SNF for rehab. CSW explained that after surgery PT will evaluate patient and make a recommendation of home health or SNF. Daughter prefers SNF. [facility] preferences. FL2 complete and faxed out. CSW explained to daughter that medicare requires a 3 night qualifying inpatient stay in a hospital in order to pay for SNF. Patient was admitted to inpatient 7/16. Daughter verbalized her understanding. CSW will continue to follow and assist as needed.           Expected Discharge Plan: Skilled Nursing Facility Barriers to Discharge: Continued Medical Work up   Patient Goals and CMS Choice     Choice offered to / list presented to : Adult Children  Expected Discharge Plan and Services Expected Discharge Plan: [place] In-house Referral: Clinical Social Work Discharge Planning Services: CM Consult Post Acute Care Choice: [place] Living arrangements for the past 2 months: [place]                                      Prior Living Arrangements/Services Living arrangements for the past 2 months: [place] Lives with:: Self Patient language and need for interpreter reviewed:: No Do you feel safe going back to the place where you live?: Yes      Need for Family Participation in Patient  Care: Yes (Comment) Care giver support system in place?: No (comment)   Criminal Activity/Legal Involvement Pertinent to Current Situation/Hospitalization: No - Comment as needed  Activities of Daily Living Home Assistive Devices/Equipment: Eyeglasses, Cane (specify quad or straight), Walker (specify type) ADL Screening (condition at time of admission) Patient's cognitive ability adequate to safely complete daily activities?: Yes Is the patient deaf or have difficulty hearing?: No Does the patient have difficulty seeing, even when wearing glasses/contacts?: No Does the patient have difficulty concentrating, remembering, or making decisions?: No Patient able to express need for assistance with ADLs?: Yes Does the patient have difficulty dressing or bathing?: No Independently performs ADLs?: Yes (appropriate for developmental age) Does the patient have difficulty walking or climbing stairs?: Yes Weakness of Legs: Both Weakness of Arms/Hands: None  Permission Sought/Granted Permission sought to share information with : Chartered certified accountant granted to share information with : Yes, Verbal Permission Granted              Emotional Assessment Appearance:: Appears stated age     Orientation: : Oriented to Self, Oriented to Place, Oriented to  Time, Oriented to Situation Alcohol / Substance Use: Not Applicable Psych Involvement: No (comment)  Admission diagnosis:  Left displaced femoral neck fracture (HCC) [S72.002A] Closed left hip fracture ([place]) [S72.002A] Patient Active Problem List   Diagnosis Date Noted  . 'light-for-dates' infant with signs of fetal malnutrition 05/31/2019  . Closed left hip fracture ([place]) 05/31/2019  .  Inflammatory polyarthritis ([place]) 05/09/2019  . Deep vein thrombosis (DVT) ([place]) 11/29/2018  . Edema of left lower extremity 11/13/2018  . Cellulitis of left lower leg 11/01/2018  . Encounter for therapeutic drug level monitoring  07/05/2018  . Allergic contact dermatitis 04/19/2018  . Atopic dermatitis 03/15/2018  . 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 Atrial fibrillation ([place]) 02/12/2018  . Melena   . Urinary tract infection without hematuria 01/04/2018  . Chronic obstructive pulmonary disease ([place]) 12/27/2017  . Acute cystitis with hematuria 12/27/2017  . Dysuria 12/27/2017  . Urinary frequency 12/27/2017  . Uncontrolled type 2 diabetes mellitus with hyperglycemia ([place]) 12/27/2017  . Iron deficiency anemia due to chronic blood loss 09/28/2016  . Avitaminosis D 09/09/2016  . B12 deficiency 09/09/2016  . Temporary cerebral vascular dysfunction 09/09/2016  . Spinal stenosis 09/09/2016  . Pain in shoulder 09/09/2016  . Restless legs syndrome 09/09/2016  . Personal history of urinary calculi 09/09/2016  . H/O deep venous thrombosis 09/09/2016  . Gout 09/09/2016  . Accumulation of fluid in tissues 09/09/2016  . Carpal tunnel syndrome 09/09/2016  . Chronic lung disease 09/09/2016  . Cataract 09/09/2016  . Appendicular ataxia 09/09/2016  . Airway hyperreactivity 09/09/2016  . Rectal bleeding   . Anemia due to blood loss, acute   . Gastrointestinal hemorrhage 08/08/2016  . Carcinoma of unknown primary ([place]) 08/02/2016  . Diabetes ([place]) 05/08/2015  . Arthropathy of hand 02/04/2010  . 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 Detrusor instability of bladder 06/18/2009  . Pure hypercholesterolemia 04/10/2009  . Essential hypertension 04/10/2009  . Diverticulitis of colon 04/10/2009   PCP:  [name], NP Pharmacy:   [place], [place] [address] Phone: [phone] Fax: [phone]     Social Determinants of Health (SDOH) Interventions    Readmission Risk Interventions No flowsheet data found.

## 2019-06-01 NOTE — NC FL2 (Signed)
[place] LEVEL OF CARE SCREENING TOOL     IDENTIFICATION  Patient Name: Monica [name] Birthdate: September 19, 1926 Sex: female Admission Date (Current Location): 05/31/2019  [place] and [place] Number:  Engineering geologist and Address:  [facility], [address] [place], [place] [address]      Provider Number: [id number]  Attending Physician Name and Address:  Nicholes Mango, MD  Relative Name and Phone Number:       Current Level of Care: Hospital Recommended Level of Care: [place] Prior Approval Number:    Date Approved/Denied:   PASRR Number: [id number]  Discharge Plan: SNF    Current Diagnoses: Patient Active Problem List   Diagnosis Date Noted  . 'light-for-dates' infant with signs of fetal malnutrition 05/31/2019  . Closed left hip fracture ([place]) 05/31/2019  . Inflammatory polyarthritis ([place]) 05/09/2019  . Deep vein thrombosis (DVT) ([place]) 11/29/2018  . Edema of left lower extremity 11/13/2018  . Cellulitis of left lower leg 11/01/2018  . Encounter for therapeutic drug level monitoring 07/05/2018  . Allergic contact dermatitis 04/19/2018  . Atopic dermatitis 03/15/2018  . 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 Atrial fibrillation ([place]) 02/12/2018  . Melena   . Urinary tract infection without hematuria 01/04/2018  . Chronic obstructive pulmonary disease ([place]) 12/27/2017  . Acute cystitis with hematuria 12/27/2017  . Dysuria 12/27/2017  . Urinary frequency 12/27/2017  . Uncontrolled type 2 diabetes mellitus with hyperglycemia ([place]) 12/27/2017  . Iron deficiency anemia due to chronic blood loss 09/28/2016  . Avitaminosis D 09/09/2016  . B12 deficiency 09/09/2016  . Temporary cerebral vascular dysfunction 09/09/2016  . Spinal stenosis 09/09/2016  . Pain in shoulder 09/09/2016  . Restless legs syndrome 09/09/2016  . Personal history of urinary calculi 09/09/2016  . H/O deep venous thrombosis 09/09/2016   . Gout 09/09/2016  . Accumulation of fluid in tissues 09/09/2016  . Carpal tunnel syndrome 09/09/2016  . Chronic lung disease 09/09/2016  . Cataract 09/09/2016  . Appendicular ataxia 09/09/2016  . Airway hyperreactivity 09/09/2016  . Rectal bleeding   . Anemia due to blood loss, acute   . Gastrointestinal hemorrhage 08/08/2016  . Carcinoma of unknown primary ([place]) 08/02/2016  . Diabetes ([place]) 05/08/2015  . Arthropathy of hand 02/04/2010  . 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 Detrusor instability of bladder 06/18/2009  . Pure hypercholesterolemia 04/10/2009  . Essential hypertension 04/10/2009  . Diverticulitis of colon 04/10/2009    Orientation RESPIRATION BLADDER Height & Weight     Self, Time, Situation, Place  O2(2 Liters Oxygen.) Incontinent Weight: 157 lb (71.2 kg) Height:  5\' 4"  (162.6 cm)  BEHAVIORAL SYMPTOMS/MOOD NEUROLOGICAL BOWEL NUTRITION STATUS      Continent Diet(Diet: Carb Modified.)  AMBULATORY STATUS COMMUNICATION OF NEEDS Skin   Extensive Assist Verbally Normal                       Personal Care Assistance Level of Assistance  Bathing, Feeding, Dressing Bathing Assistance: Limited assistance Feeding assistance: Independent Dressing Assistance: Limited assistance     Functional Limitations Info  Sight, Hearing, Speech Sight Info: Adequate Hearing Info: Adequate Speech Info: Adequate    SPECIAL CARE FACTORS FREQUENCY  PT (By licensed PT), OT (By licensed OT)     PT Frequency: 5 OT Frequency: 5            Contractures      Additional Factors Info  Code Status, Allergies Code Status Info: Full  Code. Allergies Info: Sulfa Antibiotics, Celecoxib, Acetaminophen,Codeine, Lyrica Pregabalin, Penicillin G, Petrolatum-zinc Oxide           Current Medications (06/01/2019):  This is the current hospital active medication list Current Facility-Administered Medications  Medication Dose Route Frequency Provider Last Rate Last Dose  . 0.9  %  sodium chloride infusion   Intravenous Continuous Mansy, Jan A, MD      . albuterol (PROVENTIL) (2.5 MG/3ML) 0.083% nebulizer solution 2.5 mg  2.5 mg Inhalation Q4H PRN Mansy, Jan A, MD      . amLODipine (NORVASC) tablet 10 mg  10 mg Oral Daily Gouru, Aruna, MD      . B-complex with vitamin C tablet 1 tablet  1 tablet Oral Daily Charlett Nose, RPH      . famotidine (PEPCID) tablet 20 mg  20 mg Oral Daily Mansy, Jan A, MD      . furosemide (LASIX) tablet 20 mg  20 mg Oral Daily Mansy, Jan A, MD      . gabapentin (NEURONTIN) capsule 600 mg  600 mg Oral QHS Mansy, Jan A, MD   600 mg at 05/31/19 2105  . insulin aspart (novoLOG) injection 0-9 Units  0-9 Units Subcutaneous Q4H Mansy, Arvella Merles, MD   2 Units at 06/01/19 1225  . loratadine (CLARITIN) tablet 10 mg  10 mg Oral Daily Mansy, Jan A, MD      . magnesium hydroxide (MILK OF MAGNESIA) suspension 30 mL  30 mL Oral Daily PRN Mansy, Jan A, MD      . magnesium oxide (MAG-OX) tablet 400 mg  400 mg Oral Daily Mansy, Jan A, MD      . montelukast (SINGULAIR) tablet 10 mg  10 mg Oral QHS Mansy, Jan A, MD   10 mg at 05/31/19 2105  . ondansetron (ZOFRAN) tablet 4 mg  4 mg Oral Q6H PRN Mansy, Jan A, MD       Or  . ondansetron [facility]) injection 4 mg  4 mg Intravenous Q6H PRN Mansy, Jan A, MD      . oxybutynin (DITROPAN-XL) 24 hr tablet 10 mg  10 mg Oral Daily Mansy, Arvella Merles, MD   Stopped at 06/01/19 1006  . oxyCODONE (Oxy IR/ROXICODONE) immediate release tablet 5 mg  5 mg Oral Q3H PRN Poggi, Marshall Cork, MD      . pravastatin (PRAVACHOL) tablet 40 mg  40 mg Oral Daily Mansy, Jan A, MD      . senna-docusate (Senokot-S) tablet 1 tablet  1 tablet Oral BID Gouru, Aruna, MD      . theophylline (THEO-24) 24 hr capsule 100 mg  100 mg Oral QODAY Mansy, Jan A, MD      . traMADol (ULTRAM) tablet 50 mg  50 mg Oral Q6H PRN Poggi, Marshall Cork, MD   50 mg at 05/31/19 2105  . traZODone (DESYREL) tablet 25 mg  25 mg Oral QHS PRN Mansy, Arvella Merles, MD         Discharge  Medications: Please see discharge summary for a list of discharge medications.  Relevant Imaging Results:  Relevant Lab Results:   Additional Information SSN: [id number]  Wanette Robison, Veronia Beets, LCSW

## 2019-06-01 NOTE — Progress Notes (Signed)
[facility] [place] NAME: Monica [name]    MR#:  [id number]  DATE OF BIRTH:  1926-01-08  SUBJECTIVE:  CHIEF COMPLAINT:  left hip pain okay with pain medications, INR 2.5  REVIEW OF SYSTEMS:  CONSTITUTIONAL: No fever, fatigue or weakness.  EYES: No blurred or double vision.  EARS, NOSE, AND THROAT: No tinnitus or ear pain.  RESPIRATORY: No cough, shortness of breath, wheezing or hemoptysis.  CARDIOVASCULAR: No chest pain, orthopnea, edema.  GASTROINTESTINAL: No nausea, vomiting, diarrhea or abdominal pain.  GENITOURINARY: No dysuria, hematuria.  ENDOCRINE: No polyuria, nocturia,  HEMATOLOGY: No anemia, easy bruising or bleeding SKIN: No rash or lesion. MUSCULOSKELETAL: Reporting left hip pain no joint pain or arthritis.   NEUROLOGIC: No tingling, numbness, weakness.  PSYCHIATRY: No anxiety or depression.   DRUG ALLERGIES:   Allergies  Allergen Reactions  . Sulfa Antibiotics Swelling  . Celecoxib Nausea And Vomiting  . Acetaminophen Itching  . Codeine Rash  . Lyrica [Pregabalin] Rash  . Penicillin G Rash    Has patient had a PCN reaction causing immediate rash, facial/tongue/throat swelling, SOB or lightheadedness with hypotension: Yes Has patient had a PCN reaction causing severe rash involving mucus membranes or skin necrosis: No Has patient had a PCN reaction that required hospitalization: No Has patient had a PCN reaction occurring within the last 10 years: Unknown If all of the above answers are "NO", then may proceed with Cephalosporin use.  Marland Kitchen Petrolatum-Zinc Oxide Rash    VITALS:  Blood pressure (!) 141/81, pulse 96, temperature 99 F (37.2 C), temperature source Oral, resp. rate 16, height 5\' 4"  (1.626 m), weight 71.2 kg, SpO2 93 %.  PHYSICAL EXAMINATION:  GENERAL:  85 y.o.-year-old patient lying in the bed with no acute distress.  EYES: Pupils equal, round, reactive to light and accommodation. No scleral icterus.  Extraocular muscles intact.  HEENT: Head atraumatic, normocephalic. Oropharynx and nasopharynx clear.  NECK:  Supple, no jugular venous distention. No thyroid enlargement, no tenderness.  LUNGS: Normal breath sounds bilaterally, no wheezing, rales,rhonchi or crepitation. No use of accessory muscles of respiration.  CARDIOVASCULAR: S1, S2 normal. No murmurs, rubs, or gallops.  ABDOMEN: Soft, nontender, nondistended. Bowel sounds present. EXTREMITIES: Left hip is tender and externally rotated no pedal edema, cyanosis, or clubbing.  NEUROLOGIC: Cranial nerves II through XII are intact.  Sensation intact. Gait not checked for safety PSYCHIATRIC: The patient is alert and oriented x 3.  SKIN: No obvious rash, lesion, or ulcer.    LABORATORY PANEL:   CBC Recent Labs  Lab 06/01/19 0449  WBC 13.6*  HGB 13.0  HCT 41.1  PLT 254   ------------------------------------------------------------------------------------------------------------------  Chemistries  Recent Labs  Lab 05/31/19 0353 06/01/19 0449  NA 126* 131*  K 4.0 5.3*  CL 92* 96*  CO2 24 26  GLUCOSE 170* 147*  BUN 16 17  CREATININE 0.79 1.07*  CALCIUM 9.4 9.2  AST 31  --   ALT 21  --   ALKPHOS 103  --   BILITOT 1.1  --    ------------------------------------------------------------------------------------------------------------------  Cardiac Enzymes No results for input(s): TROPONINI in the last 168 hours. ------------------------------------------------------------------------------------------------------------------  RADIOLOGY:  Dg Lumbar Spine Complete  Result Date: 05/31/2019 CLINICAL DATA:  Back pain after fall EXAM: LUMBAR SPINE - COMPLETE 4+ VIEW COMPARISON:  12/12/2013 abdominal CT FINDINGS: Negative for acute fracture or traumatic malalignment. Generalized facet degeneration with slight L4-5 anterolisthesis. Mild lumbar levocurvature. Generalized osteopenia. IMPRESSION: No acute finding. Electronically  Signed   By: Monte Fantasia M.D.   On: 05/31/2019 04:24   Dg Hip Unilat W Or Wo Pelvis 2-3 Views Left  Result Date: 05/31/2019 CLINICAL DATA:  Fall with left hip pain EXAM: DG HIP (WITH OR WITHOUT PELVIS) 2-3V LEFT COMPARISON:  None. FINDINGS: Subcapital left femoral neck fracture with dorsal and lateral impaction. No dislocation. Generalized osteopenia.  No notable acetabular spurring. IMPRESSION: Impacted left subcapital femoral neck fracture. Electronically Signed   By: Monte Fantasia M.D.   On: 05/31/2019 04:21    EKG:   Orders placed or performed during the hospital encounter of 05/31/19  . EKG 12-Lead  . EKG 12-Lead    ASSESSMENT AND PLAN:   1.  Left hip fracture secondary to mechanical fall.   Pain management as needed Orthopedics Dr. Marry Guan is consulted and are planning to do any surgery today or tomorrow in view of her INR being high Coumadin is on hold INR is at 2.5 Anticipating surgery on Sunday, if INR is not improving will consider small dose of vitamin K    2.  Hyponatremia- could be from hydrochlorothiazide and dehydration Hold hydrochlorothiazide and hydrate with IV fluids We will check random urine sodium and osmolarity Repeat labs.  Patient is mentating fine Sodium at 126 previously it was normal in 2019.  Improving sodium at 131 Hydrochlorothiazide on hold  3.  Hypertension.  We will continue Lotrel and hold HCTZ in view of hyponatremia  4.  Type II diabetes mellitus.  The patient will be placed on supplemental coverage with NovoLog.  5.   Atrial fibrillation.  We will holding off Coumadin at this time.  6.   COPD.  No current exacerbation.  We will continue theophylline for now and place the patient on PRN duo nebs.  7.  Hyperkalemia-ACE inhibitor on hold.  Giving Veltassa repeat potassium  DVT prophylaxis.  SCDs.  Coumadin is being held off.     All the records are reviewed and case discussed with Care Management/Social Workerr. Management  plans discussed with the patient, susan -daughter and they are in agreement.  CODE STATUS: fc   TOTAL TIME TAKING CARE OF THIS PATIENT: 36  minutes.   POSSIBLE D/C IN 2-3 DAYS, DEPENDING ON CLINICAL CONDITION.  Note: This dictation was prepared with Dragon dictation along with smaller phrase technology. Any transcriptional errors that result from this process are unintentional.   Nicholes Mango M.D on 06/01/2019 at 9:30 AM  Between 7am to 6pm - Pager - [phone] After 6pm go to [email or web address] - password [email or web address] [facility]  Office  [phone]  CC: Primary care physician; [name], NP

## 2019-06-02 DIAGNOSIS — E871 Hypo-osmolality and hyponatremia: Secondary | ICD-10-CM | POA: Diagnosis not present

## 2019-06-02 DIAGNOSIS — E119 Type 2 diabetes mellitus without complications: Secondary | ICD-10-CM | POA: Diagnosis not present

## 2019-06-02 DIAGNOSIS — J449 Chronic obstructive pulmonary disease, unspecified: Secondary | ICD-10-CM | POA: Diagnosis not present

## 2019-06-02 DIAGNOSIS — I10 Essential (primary) hypertension: Secondary | ICD-10-CM | POA: Diagnosis not present

## 2019-06-02 DIAGNOSIS — S72002A Fracture of unspecified part of neck of left femur, initial encounter for closed fracture: Secondary | ICD-10-CM | POA: Diagnosis not present

## 2019-06-02 LAB — GLUCOSE, CAPILLARY
Glucose-Capillary: 164 mg/dL — ABNORMAL HIGH (ref 70–99)
Glucose-Capillary: 158 mg/dL — ABNORMAL HIGH (ref 70–99)
Glucose-Capillary: 142 mg/dL — ABNORMAL HIGH (ref 70–99)
Glucose-Capillary: 157 mg/dL — ABNORMAL HIGH (ref 70–99)
Glucose-Capillary: 163 mg/dL — ABNORMAL HIGH (ref 70–99)
Glucose-Capillary: 176 mg/dL — ABNORMAL HIGH (ref 70–99)

## 2019-06-02 LAB — BASIC METABOLIC PANEL
Anion gap: 8 (ref 5–15)
BUN: 23 mg/dL (ref 8–23)
CO2: 25 mmol/L (ref 22–32)
Calcium: 9 mg/dL (ref 8.9–10.3)
Chloride: 93 mmol/L — ABNORMAL LOW (ref 98–111)
Creatinine, Ser: 0.83 mg/dL (ref 0.44–1.00)
GFR calc Af Amer: >60 mL/min (ref >60)
GFR calc non Af Amer: >60 mL/min (ref >60)
Glucose, Bld: 161 mg/dL — ABNORMAL HIGH (ref 70–99)
Potassium: 4.6 mmol/L (ref 3.5–5.1)
Sodium: 126 mmol/L — ABNORMAL LOW (ref 135–145)

## 2019-06-02 LAB — PROTIME-INR
INR: 1.9 — ABNORMAL HIGH (ref 0.8–1.2)
Prothrombin Time: 21.1 seconds — ABNORMAL HIGH (ref 11.4–15.2)

## 2019-06-02 MED ORDER — FUROSEMIDE 10 MG/ML IJ SOLN
20.0000 mg | Freq: Once | INTRAMUSCULAR | Status: AC
  Administered 2019-06-02: 20 mg via INTRAVENOUS
  Filled 2019-06-02: qty 4

## 2019-06-02 NOTE — Progress Notes (Signed)
[facility] [place] NAME: Monica [name]    MR#:  [id number]  DATE OF BIRTH:  01-16-1926  SUBJECTIVE:  CHIEF COMPLAINT:  left hip pain okay with pain medications, INR 1.9/ Patient denies any other complaints except left hip pain, very hard of hearing. REVIEW OF SYSTEMS:  CONSTITUTIONAL: No fever, fatigue or weakness.  EYES: No blurred or double vision.  EARS, NOSE, AND THROAT: No tinnitus or ear pain.  RESPIRATORY: No cough, shortness of breath, wheezing or hemoptysis.  CARDIOVASCULAR: No chest pain, orthopnea, edema.  GASTROINTESTINAL: No nausea, vomiting, diarrhea or abdominal pain.  GENITOURINARY: No dysuria, hematuria.  ENDOCRINE: No polyuria, nocturia,  HEMATOLOGY: No anemia, easy bruising or bleeding SKIN: No rash or lesion. MUSCULOSKELETAL: Reporting left hip pain no joint pain or arthritis.   NEUROLOGIC: No tingling, numbness, weakness.  PSYCHIATRY: No anxiety or depression.   DRUG ALLERGIES:   Allergies  Allergen Reactions  . Sulfa Antibiotics Swelling  . Celecoxib Nausea And Vomiting  . Acetaminophen Itching  . Codeine Rash  . Lyrica [Pregabalin] Rash  . Penicillin G Rash    Has patient had a PCN reaction causing immediate rash, facial/tongue/throat swelling, SOB or lightheadedness with hypotension: Yes Has patient had a PCN reaction causing severe rash involving mucus membranes or skin necrosis: No Has patient had a PCN reaction that required hospitalization: No Has patient had a PCN reaction occurring within the last 10 years: Unknown If all of the above answers are "NO", then may proceed with Cephalosporin use.  Marland Kitchen Petrolatum-Zinc Oxide Rash    VITALS:  Blood pressure (!) 129/52, pulse 83, temperature (!) 97.5 F (36.4 C), temperature source Oral, resp. rate 18, height 5\' 4"  (1.626 m), weight 71.2 kg, SpO2 94 %.  PHYSICAL EXAMINATION:  GENERAL:  85 y.o.-year-old patient lying in the bed with no acute distress.   EYES: Pupils equal, round, reactive to light and accommodation. No scleral icterus. Extraocular muscles intact.  HEENT: Head atraumatic, normocephalic. Oropharynx and nasopharynx clear.  NECK:  Supple, no jugular venous distention. No thyroid enlargement, no tenderness.  LUNGS: Normal breath sounds bilaterally, no wheezing, rales,rhonchi or crepitation. No use of accessory muscles of respiration.  CARDIOVASCULAR: S1, S2 normal. No murmurs, rubs, or gallops.  ABDOMEN: Soft, nontender, nondistended. Bowel sounds present. EXTREMITIES: Left hip is tender and externally rotated no pedal edema, cyanosis, or clubbing.  NEUROLOGIC: Cranial nerves II through XII are intact.  Sensation intact. Gait not checked for safety PSYCHIATRIC: The patient is alert and oriented x 3.  SKIN: No obvious rash, lesion, or ulcer.    LABORATORY PANEL:   CBC Recent Labs  Lab 06/01/19 0449  WBC 13.6*  HGB 13.0  HCT 41.1  PLT 254   ------------------------------------------------------------------------------------------------------------------  Chemistries  Recent Labs  Lab 05/31/19 0353  06/02/19 0419  NA 126*   < > 126*  K 4.0   < > 4.6  CL 92*   < > 93*  CO2 24   < > 25  GLUCOSE 170*   < > 161*  BUN 16   < > 23  CREATININE 0.79   < > 0.83  CALCIUM 9.4   < > 9.0  AST 31  --   --   ALT 21  --   --   ALKPHOS 103  --   --   BILITOT 1.1  --   --    < > = values in this interval not displayed.   ------------------------------------------------------------------------------------------------------------------  Cardiac Enzymes No results for input(s): TROPONINI in the last 168 hours. ------------------------------------------------------------------------------------------------------------------  RADIOLOGY:  No results found.  EKG:   Orders placed or performed during the hospital encounter of 05/31/19  . EKG 12-Lead  . EKG 12-Lead    ASSESSMENT AND PLAN:   1.  Left hip fracture  secondary to mechanical fall.   Pain management as needed Orthopedics Dr. Marry Guan is consulted and are planning to do any surgery today or tomorrow in view of her INR being high Coumadin is on hold INR is at 2.5 Anticipating surgery on Sunday, if INR is not improving will consider small dose of vitamin K    2.  Hyponatremia- could be due to diuretics, patient received gentle hydration.     Sodium at 126 previously it was normal in 2019.  Improving sodium at 131 Hydrochlorothiazide on hold  3.  Hypertension.  We will continue Lotrel and hold HCTZ, Lasix in view of hyponatremia, recheck sodium tomorrow.  4.  Type II diabetes mellitus.  The patient will be placed on supplemental coverage with NovoLog.  5.   Atrial fibrillation.  We will holding off Coumadin at this time.  6.   COPD.  No current exacerbation.  We will continue theophylline for now and place the patient on PRN duo nebs.  7.  Hyperkalemia-ACE inhibitor on hold.  Giving Veltassa repeat potassium  DVT prophylaxis.  SCDs.  Coumadin is being held off.     All the records are reviewed and case discussed with Care Management/Social Workerr. Management plans discussed with the patient, susan -daughter and they are in agreement.  CODE STATUS: fc   TOTAL TIME TAKING CARE OF THIS PATIENT: 36  minutes.   POSSIBLE D/C IN 2-3 DAYS, DEPENDING ON CLINICAL CONDITION.  Note: This dictation was prepared with Dragon dictation along with smaller phrase technology. Any transcriptional errors that result from this process are unintentional.   Epifanio Lesches M.D on 06/02/2019 at 11:44 AM  Between 7am to 6pm - Pager - [phone] After 6pm go to [email or web address] - password [email or web address] [facility]  Office  [phone]  CC: Primary care physician; [name], NP

## 2019-06-02 NOTE — Progress Notes (Signed)
Spoke with daughter over phone for update on patient.

## 2019-06-02 NOTE — Progress Notes (Signed)
Patient only voided once this shift at this time. Bladder scanned for 340 mls of urine. Will notify MD.

## 2019-06-02 NOTE — Progress Notes (Signed)
Pt not voiding. Bladder scan done, 830 mls. MD made aware. Order for in and out received.

## 2019-06-03 ENCOUNTER — Encounter
Admission: EM | Disposition: A | Discharge status: Skilled Nursing Facility | Payer: Self-pay | Source: Home / Self Care | Attending: Internal Medicine

## 2019-06-03 ENCOUNTER — Inpatient Hospital Stay: Payer: Medicare Other | Admitting: Anesthesiology

## 2019-06-03 ENCOUNTER — Other Ambulatory Visit: Payer: Self-pay

## 2019-06-03 ENCOUNTER — Inpatient Hospital Stay: Payer: Medicare Other

## 2019-06-03 DIAGNOSIS — E1165 Type 2 diabetes mellitus with hyperglycemia: Secondary | ICD-10-CM | POA: Diagnosis not present

## 2019-06-03 DIAGNOSIS — S72002A Fracture of unspecified part of neck of left femur, initial encounter for closed fracture: Secondary | ICD-10-CM | POA: Diagnosis not present

## 2019-06-03 DIAGNOSIS — J449 Chronic obstructive pulmonary disease, unspecified: Secondary | ICD-10-CM | POA: Diagnosis not present

## 2019-06-03 DIAGNOSIS — I10 Essential (primary) hypertension: Secondary | ICD-10-CM | POA: Diagnosis not present

## 2019-06-03 DIAGNOSIS — Z471 Aftercare following joint replacement surgery: Secondary | ICD-10-CM | POA: Diagnosis not present

## 2019-06-03 DIAGNOSIS — E785 Hyperlipidemia, unspecified: Secondary | ICD-10-CM | POA: Diagnosis not present

## 2019-06-03 DIAGNOSIS — Z96642 Presence of left artificial hip joint: Secondary | ICD-10-CM | POA: Diagnosis not present

## 2019-06-03 DIAGNOSIS — E871 Hypo-osmolality and hyponatremia: Secondary | ICD-10-CM | POA: Diagnosis not present

## 2019-06-03 DIAGNOSIS — E119 Type 2 diabetes mellitus without complications: Secondary | ICD-10-CM | POA: Diagnosis not present

## 2019-06-03 HISTORY — PX: HIP ARTHROPLASTY: SHX981

## 2019-06-03 LAB — BASIC METABOLIC PANEL
Anion gap: 11 (ref 5–15)
BUN: 29 mg/dL — ABNORMAL HIGH (ref 8–23)
CO2: 24 mmol/L (ref 22–32)
Calcium: 9 mg/dL (ref 8.9–10.3)
Chloride: 93 mmol/L — ABNORMAL LOW (ref 98–111)
Creatinine, Ser: 0.89 mg/dL (ref 0.44–1.00)
GFR calc Af Amer: >60 mL/min (ref >60)
GFR calc non Af Amer: 57 mL/min — ABNORMAL LOW (ref >60)
Glucose, Bld: 169 mg/dL — ABNORMAL HIGH (ref 70–99)
Potassium: 4.5 mmol/L (ref 3.5–5.1)
Sodium: 128 mmol/L — ABNORMAL LOW (ref 135–145)

## 2019-06-03 LAB — GLUCOSE, CAPILLARY
Glucose-Capillary: 160 mg/dL — ABNORMAL HIGH (ref 70–99)
Glucose-Capillary: 169 mg/dL — ABNORMAL HIGH (ref 70–99)
Glucose-Capillary: 156 mg/dL — ABNORMAL HIGH (ref 70–99)
Glucose-Capillary: 167 mg/dL — ABNORMAL HIGH (ref 70–99)

## 2019-06-03 LAB — PROTIME-INR
INR: 1.5 — ABNORMAL HIGH (ref 0.8–1.2)
Prothrombin Time: 17.7 seconds — ABNORMAL HIGH (ref 11.4–15.2)

## 2019-06-03 SURGERY — HEMIARTHROPLASTY, HIP, DIRECT ANTERIOR APPROACH, FOR FRACTURE
Anesthesia: Monitor Anesthesia Care | Laterality: Left

## 2019-06-03 MED ORDER — STERILE WATER FOR INJECTION IJ SOLN
INTRAMUSCULAR | Status: AC
  Filled 2019-06-03: qty 10

## 2019-06-03 MED ORDER — PHENYLEPHRINE HCL (PRESSORS) 10 MG/ML IV SOLN
INTRAVENOUS | Status: AC
  Filled 2019-06-03: qty 1

## 2019-06-03 MED ORDER — HYDROMORPHONE HCL 1 MG/ML IJ SOLN: 0.5000 mg | INTRAMUSCULAR | Status: DC | PRN

## 2019-06-03 MED ORDER — PROPOFOL 500 MG/50ML IV EMUL
INTRAVENOUS | Status: DC | PRN
  Administered 2019-06-03: 10 ug/kg/min via INTRAVENOUS

## 2019-06-03 MED ORDER — WARFARIN SODIUM 5 MG PO TABS
5.0000 mg | ORAL_TABLET | Freq: Once | ORAL | Status: AC
  Administered 2019-06-03: 5 mg via ORAL
  Filled 2019-06-03: qty 1

## 2019-06-03 MED ORDER — NEOMYCIN-POLYMYXIN B GU 40-200000 IR SOLN
Status: AC
  Filled 2019-06-03: qty 4

## 2019-06-03 MED ORDER — MAGNESIUM HYDROXIDE 400 MG/5ML PO SUSP
30.0000 mL | Freq: Every day | ORAL | Status: DC | PRN
  Administered 2019-06-03: 30 mL via ORAL
  Filled 2019-06-03: qty 30

## 2019-06-03 MED ORDER — MENTHOL 3 MG MT LOZG
1.0000 | LOZENGE | OROMUCOSAL | Status: DC | PRN
  Filled 2019-06-03 (×2): qty 9

## 2019-06-03 MED ORDER — METOCLOPRAMIDE HCL 10 MG PO TABS: 5.0000 mg | ORAL_TABLET | Freq: Three times a day (TID) | ORAL | Status: DC | PRN

## 2019-06-03 MED ORDER — ONDANSETRON HCL 4 MG PO TABS: 4.0000 mg | ORAL_TABLET | Freq: Four times a day (QID) | ORAL | Status: DC | PRN

## 2019-06-03 MED ORDER — FENTANYL CITRATE (PF) 100 MCG/2ML IJ SOLN
INTRAMUSCULAR | Status: DC | PRN
  Administered 2019-06-03: 25 ug via INTRAVENOUS

## 2019-06-03 MED ORDER — BUPIVACAINE HCL (PF) 0.5 % IJ SOLN
INTRAMUSCULAR | Status: AC
Start: 2019-06-03 — End: ?
  Filled 2019-06-03: qty 10

## 2019-06-03 MED ORDER — ONDANSETRON HCL 4 MG/2ML IJ SOLN: 4.0000 mg | Freq: Four times a day (QID) | INTRAMUSCULAR | Status: DC | PRN

## 2019-06-03 MED ORDER — PROPOFOL 10 MG/ML IV BOLUS
INTRAVENOUS | Status: AC
Start: 2019-06-03 — End: ?
  Filled 2019-06-03: qty 40

## 2019-06-03 MED ORDER — KETAMINE HCL 50 MG/ML IJ SOLN
INTRAMUSCULAR | Status: AC
  Filled 2019-06-03: qty 10

## 2019-06-03 MED ORDER — FERROUS SULFATE 325 (65 FE) MG PO TABS
325.0000 mg | ORAL_TABLET | Freq: Two times a day (BID) | ORAL | Status: DC
  Administered 2019-06-03 – 2019-06-09 (×13): 325 mg via ORAL
  Filled 2019-06-03 (×13): qty 1

## 2019-06-03 MED ORDER — BUPIVACAINE HCL (PF) 0.5 % IJ SOLN
INTRAMUSCULAR | Status: DC | PRN
  Administered 2019-06-03: 2.3 mL

## 2019-06-03 MED ORDER — FLEET ENEMA 7-19 GM/118ML RE ENEM: 1.0000 | ENEMA | Freq: Once | RECTAL | Status: DC | PRN

## 2019-06-03 MED ORDER — FENTANYL CITRATE (PF) 100 MCG/2ML IJ SOLN
INTRAMUSCULAR | Status: AC
  Filled 2019-06-03: qty 2

## 2019-06-03 MED ORDER — ACETAMINOPHEN 325 MG PO TABS: 325.0000 mg | ORAL_TABLET | Freq: Four times a day (QID) | ORAL | Status: DC | PRN

## 2019-06-03 MED ORDER — IPRATROPIUM-ALBUTEROL 0.5-2.5 (3) MG/3ML IN SOLN
3.0000 mL | RESPIRATORY_TRACT | Status: DC
  Administered 2019-06-03: 3 mL via RESPIRATORY_TRACT

## 2019-06-03 MED ORDER — SEVOFLURANE IN SOLN
RESPIRATORY_TRACT | Status: AC
  Filled 2019-06-03: qty 250

## 2019-06-03 MED ORDER — KETAMINE HCL 10 MG/ML IJ SOLN
INTRAMUSCULAR | Status: DC | PRN
  Administered 2019-06-03: 10 mg via INTRAVENOUS

## 2019-06-03 MED ORDER — CLINDAMYCIN PHOSPHATE 900 MG/50ML IV SOLN
INTRAVENOUS | Status: DC | PRN
  Administered 2019-06-03: 900 mg via INTRAVENOUS

## 2019-06-03 MED ORDER — WARFARIN - PHARMACIST DOSING INPATIENT
Freq: Every day | Status: DC
  Administered 2019-06-03 – 2019-06-09 (×4)

## 2019-06-03 MED ORDER — BISACODYL 10 MG RE SUPP
10.0000 mg | Freq: Every day | RECTAL | Status: DC | PRN
  Administered 2019-06-05: 10 mg via RECTAL
  Filled 2019-06-03: qty 1

## 2019-06-03 MED ORDER — METOCLOPRAMIDE HCL 5 MG/ML IJ SOLN: 5.0000 mg | Freq: Three times a day (TID) | INTRAMUSCULAR | Status: DC | PRN

## 2019-06-03 MED ORDER — EPHEDRINE SULFATE 50 MG/ML IJ SOLN
INTRAMUSCULAR | Status: AC
Start: 2019-06-03 — End: ?
  Filled 2019-06-03: qty 1

## 2019-06-03 MED ORDER — PHENYLEPHRINE HCL (PRESSORS) 10 MG/ML IV SOLN
INTRAVENOUS | Status: DC | PRN
  Administered 2019-06-03: 200 ug via INTRAVENOUS
  Administered 2019-06-03: 100 ug via INTRAVENOUS
  Administered 2019-06-03 (×2): 200 ug via INTRAVENOUS

## 2019-06-03 MED ORDER — EPHEDRINE SULFATE 50 MG/ML IJ SOLN
INTRAMUSCULAR | Status: DC | PRN
  Administered 2019-06-03: 15 mg via INTRAVENOUS
  Administered 2019-06-03: 10 mg via INTRAVENOUS

## 2019-06-03 MED ORDER — CLINDAMYCIN PHOSPHATE 600 MG/50ML IV SOLN
600.0000 mg | Freq: Four times a day (QID) | INTRAVENOUS | Status: AC
  Administered 2019-06-03 (×2): 600 mg via INTRAVENOUS
  Filled 2019-06-03 (×2): qty 50

## 2019-06-03 MED ORDER — SODIUM CHLORIDE 0.9 % IV SOLN
INTRAVENOUS | Status: DC | PRN
  Administered 2019-06-03: 30 ug/min via INTRAVENOUS

## 2019-06-03 MED ORDER — ONDANSETRON HCL 4 MG/2ML IJ SOLN: 4.0000 mg | Freq: Once | INTRAMUSCULAR | Status: DC | PRN

## 2019-06-03 MED ORDER — NYSTATIN 100000 UNIT/GM EX CREA
TOPICAL_CREAM | Freq: Two times a day (BID) | CUTANEOUS | Status: DC
  Administered 2019-06-03 – 2019-06-09 (×4): via TOPICAL
  Filled 2019-06-03: qty 15

## 2019-06-03 MED ORDER — PHENOL 1.4 % MT LIQD
1.0000 | OROMUCOSAL | Status: DC | PRN
  Filled 2019-06-03: qty 177

## 2019-06-03 MED ORDER — NEOMYCIN-POLYMYXIN B GU 40-200000 IR SOLN
Status: AC
  Filled 2019-06-03: qty 20

## 2019-06-03 MED ORDER — CLINDAMYCIN PHOSPHATE 600 MG/50ML IV SOLN
600.0000 mg | INTRAVENOUS | Status: AC
  Filled 2019-06-03: qty 50

## 2019-06-03 MED ORDER — IPRATROPIUM-ALBUTEROL 0.5-2.5 (3) MG/3ML IN SOLN
RESPIRATORY_TRACT | Status: AC
  Filled 2019-06-03: qty 3

## 2019-06-03 MED ORDER — OXYCODONE HCL 5 MG PO TABS: 10.0000 mg | ORAL_TABLET | ORAL | Status: DC | PRN

## 2019-06-03 MED ORDER — FENTANYL CITRATE (PF) 100 MCG/2ML IJ SOLN
25.0000 ug | INTRAMUSCULAR | Status: DC | PRN
  Administered 2019-06-03: 25 ug via INTRAVENOUS

## 2019-06-03 MED ORDER — OXYCODONE HCL 5 MG PO TABS: 5.0000 mg | ORAL_TABLET | ORAL | Status: DC | PRN

## 2019-06-03 MED ORDER — PROPOFOL 10 MG/ML IV BOLUS
INTRAVENOUS | Status: DC | PRN
  Administered 2019-06-03 (×6): 10 mg via INTRAVENOUS

## 2019-06-03 MED ORDER — NEOMYCIN-POLYMYXIN B GU 40-200000 IR SOLN
Status: DC | PRN
  Administered 2019-06-03: 16 mL

## 2019-06-03 MED ORDER — CLINDAMYCIN PHOSPHATE 900 MG/50ML IV SOLN
INTRAVENOUS | Status: AC
Start: 2019-06-03 — End: ?
  Filled 2019-06-03: qty 50

## 2019-06-03 SURGICAL SUPPLY — 61 items
BAG DECANTER FOR FLEXI CONT (MISCELLANEOUS) ×3 IMPLANT
BLADE SAW 90X25X1.19 OSCILLAT (BLADE) ×3 IMPLANT
CANISTER SUCT 1200ML W/VALVE (MISCELLANEOUS) ×1 IMPLANT
CANISTER SUCT 3000ML PPV (MISCELLANEOUS) ×4 IMPLANT
CEMENT HV SMART SET (Cement) ×6 IMPLANT
CEMENT RESTRICTOR DEPUY SZ 4 (Cement) ×2 IMPLANT
CENTRALIZER STEM HIP 12MM (Hips) ×2 IMPLANT
COVER BACK TABLE REUSABLE LG (DRAPES) ×3 IMPLANT
COVER WAND RF STERILE (DRAPES) ×3 IMPLANT
DRAPE 3/4 80X56 (DRAPES) ×3 IMPLANT
DRAPE INCISE IOBAN 66X60 STRL (DRAPES) ×3 IMPLANT
DRSG DERMACEA 8X12 NADH (GAUZE/BANDAGES/DRESSINGS) ×3 IMPLANT
DRSG OPSITE POSTOP 4X12 (GAUZE/BANDAGES/DRESSINGS) ×3 IMPLANT
DRSG OPSITE POSTOP 4X14 (GAUZE/BANDAGES/DRESSINGS) ×1 IMPLANT
DRSG TEGADERM 4X4.75 (GAUZE/BANDAGES/DRESSINGS) ×3 IMPLANT
DURAPREP 26ML APPLICATOR (WOUND CARE) ×6 IMPLANT
ELECT REM PT RETURN 9FT ADLT (ELECTROSURGICAL) ×3
ELECTRODE REM PT RTRN 9FT ADLT (ELECTROSURGICAL) ×1 IMPLANT
GAUZE PACK 2X3YD (GAUZE/BANDAGES/DRESSINGS) ×3 IMPLANT
GLOVE BIOGEL M STRL SZ7.5 (GLOVE) ×14 IMPLANT
GLOVE INDICATOR 8.0 STRL GRN (GLOVE) ×3 IMPLANT
GOWN STRL REUS W/ TWL LRG LVL3 (GOWN DISPOSABLE) ×2 IMPLANT
GOWN STRL REUS W/TWL LRG LVL3 (GOWN DISPOSABLE) ×9
HEAD FEM UNIPOLAR 42 OD STRL (Hips) ×2 IMPLANT
HEMOVAC 400CC 10FR (MISCELLANEOUS) ×3 IMPLANT
HOLDER FOLEY CATH W/STRAP (MISCELLANEOUS) ×3 IMPLANT
HOOD PEEL AWAY FLYTE STAYCOOL (MISCELLANEOUS) ×6 IMPLANT
IV NS 100ML SINGLE PACK (IV SOLUTION) ×3 IMPLANT
KIT TURNOVER KIT A (KITS) ×3 IMPLANT
MANIFOLD NEPTUNE II (INSTRUMENTS) ×3 IMPLANT
NDL FILTER BLUNT 18X1 1/2 (NEEDLE) ×1 IMPLANT
NDL SAFETY ECLIPSE 18X1.5 (NEEDLE) ×1 IMPLANT
NEEDLE FILTER BLUNT 18X 1/2SAF (NEEDLE) ×2
NEEDLE FILTER BLUNT 18X1 1/2 (NEEDLE) ×1 IMPLANT
NEEDLE HYPO 18GX1.5 SHARP (NEEDLE) ×3
NS IRRIG 1000ML POUR BTL (IV SOLUTION) ×3 IMPLANT
PACK HIP PROSTHESIS (MISCELLANEOUS) ×3 IMPLANT
PENCIL SMOKE ULTRAEVAC 22 CON (MISCELLANEOUS) ×3 IMPLANT
PRESSURIZER CEMENT PROX FEM SM (MISCELLANEOUS) ×3 IMPLANT
PRESSURIZER FEM CANAL M (MISCELLANEOUS) ×3 IMPLANT
PULSAVAC PLUS IRRIG FAN TIP (DISPOSABLE) ×3
SOL .9 NS 3000ML IRR  AL (IV SOLUTION) ×2
SOL .9 NS 3000ML IRR AL (IV SOLUTION) ×1
SOL .9 NS 3000ML IRR UROMATIC (IV SOLUTION) ×1 IMPLANT
SOL PREP PVP 2OZ (MISCELLANEOUS) ×3
SOLUTION PREP PVP 2OZ (MISCELLANEOUS) ×1 IMPLANT
SPACER DEPUY (Hips) ×2 IMPLANT
SPONGE DRAIN TRACH 4X4 STRL 2S (GAUZE/BANDAGES/DRESSINGS) ×3 IMPLANT
STAPLER SKIN PROX 35W (STAPLE) ×3 IMPLANT
STEM SUMMIT CEMENT BASIC SZ4 (Hips) ×2 IMPLANT
SUT ETHIBOND #5 BRAIDED 30INL (SUTURE) ×3 IMPLANT
SUT VIC AB 0 CT1 36 (SUTURE) ×3 IMPLANT
SUT VIC AB 1 CT1 36 (SUTURE) ×6 IMPLANT
SUT VIC AB 2-0 CT1 27 (SUTURE) ×3
SUT VIC AB 2-0 CT1 TAPERPNT 27 (SUTURE) ×1 IMPLANT
SYR 20CC LL (SYRINGE) ×3 IMPLANT
SYR TB 1ML 27GX1/2 LL (SYRINGE) ×3 IMPLANT
TAPE TRANSPORE STRL 2 31045 (GAUZE/BANDAGES/DRESSINGS) ×3 IMPLANT
TIP BRUSH PULSAVAC PLUS 24.33 (MISCELLANEOUS) ×3 IMPLANT
TIP FAN IRRIG PULSAVAC PLUS (DISPOSABLE) ×1 IMPLANT
TOWER CARTRIDGE SMART MIX (DISPOSABLE) ×3 IMPLANT

## 2019-06-03 NOTE — Transfer of Care (Signed)
Immediate Anesthesia Transfer of Care Note  Patient: Monica [name]  Procedure(s) Performed: Procedure(s): ARTHROPLASTY BIPOLAR HIP (HEMIARTHROPLASTY) (Left)  Patient Location: PACU  Anesthesia Type:Spinal  Level of Consciousness: awake, alert  and oriented  Airway & Oxygen Therapy: Patient Spontanous Breathing and Patient connected to face mask oxygen  Post-op Assessment: Report given to RN and Post -op Vital signs reviewed and stable  Post vital signs: Reviewed and stable  Last Vitals:  Vitals:   06/03/19 0738 06/03/19 1134  BP: 139/64 (!) 108/59  Pulse: (!) 110 99  Resp: 17 19  Temp: 37.7 C 37.4 C  SpO2: 19% 62%    Complications: No apparent anesthesia complications

## 2019-06-03 NOTE — Anesthesia Procedure Notes (Signed)
Spinal  Patient location during procedure: OR Start time: 06/03/2019 8:42 AM End time: 06/03/2019 8:51 AM Staffing Anesthesiologist: Alphonsus Sias, MD Performed: anesthesiologist  Preanesthetic Checklist Completed: patient identified, site marked, surgical consent, pre-op evaluation, timeout performed, IV checked, risks and benefits discussed and monitors and equipment checked Spinal Block Patient position: right lateral decubitus Prep: ChloraPrep Patient monitoring: heart rate, cardiac monitor, continuous pulse ox and blood pressure Approach: right paramedian Location: L3-4 Injection technique: single-shot Needle Needle type: Pencil-Tip  Needle gauge: 24 G Needle length: 9 cm Needle insertion depth: 6 cm Additional Notes Hypobaric bupiv 0.3%/12 mg/ 3 attempts L4/5 & finally L3/4. Tol well w IVS, clear CSF, aspirated x 3 during injection

## 2019-06-03 NOTE — Progress Notes (Signed)
[facility] for warfarin Indication: atrial fibrillation  Allergies  Allergen Reactions  . Sulfa Antibiotics Swelling  . Celecoxib Nausea And Vomiting  . Acetaminophen Itching  . Codeine Rash  . Lyrica [Pregabalin] Rash  . Penicillin G Rash    Has patient had a PCN reaction causing immediate rash, facial/tongue/throat swelling, SOB or lightheadedness with hypotension: Yes Has patient had a PCN reaction causing severe rash involving mucus membranes or skin necrosis: No Has patient had a PCN reaction that required hospitalization: No Has patient had a PCN reaction occurring within the last 10 years: Unknown If all of the above answers are "NO", then may proceed with Cephalosporin use.  Marland Kitchen Petrolatum-Zinc Oxide Rash    Patient Measurements: Height: 5\' 4"  (162.6 cm) Weight: 157 lb (71.2 kg) IBW/kg (Calculated) : 54.7  Vital Signs: Temp: 98.2 F (36.8 C) (07/19 1209) Temp Source: Temporal (07/19 1134) BP: 123/55 (07/19 1209) Pulse Rate: 98 (07/19 1217)  Labs: Recent Labs    06/01/19 0449 06/02/19 0419 06/03/19 0522  HGB 13.0  --   --   HCT 41.1  --   --   PLT 254  --   --   LABPROT 26.6* 21.1* 17.7*  INR 2.5* 1.9* 1.5*  CREATININE 1.07* 0.83 0.89    Estimated Creatinine Clearance: 39.8 mL/min (by C-G formula based on SCr of 0.89 mg/dL).   Medical History: Past Medical History:  Diagnosis Date  . Atrial fibrillation ([place])   . Bronchitis   . Cancer ([place])    Left leg growth, kidneys, lungs and breasts  . Carcinoma of unknown primary ([place])   . COPD (chronic obstructive pulmonary disease) ([place])   . Diabetes mellitus without complication ([place])   . Hyperlipidemia   . Hypertension   . Pancreatitis   . Pneumonia   . Stroke ([place])    TIA's  . Vitamin B12 deficiency     Assessment: 85 year old female presented after a fall resulting in left femoral neck fracture. Patient now s/p left hip hemiarthroplasty. Patient on warfarin PTA  for afib. Home dose 4 mg daily except 2 mg on Tuesdays. Pharmacy consulted to dose warfarin. INR now sub-therapeutic s/p Vit K.  Date INR Dose 7/19 1.5  Goal of Therapy:  INR 2-3 Monitor platelets by anticoagulation protocol: Yes   Plan:  Will give one time dose of warfarin 5 mg today at 1800. Will plan to likely continue home dose in the next one to two days as patient was therapeutic on arrival. INR with morning labs. CBC per protocol.  Tawnya Crook, PharmD Clinical Pharmacist 06/03/2019,12:20 PM

## 2019-06-03 NOTE — Anesthesia Preprocedure Evaluation (Addendum)
Anesthesia Evaluation  Patient identified by MRN, date of birth, ID band Patient awake    Reviewed: Allergy & Precautions, H&P , NPO status , Patient's Chart, lab work & pertinent test results, reviewed documented beta blocker date and time   Airway Mallampati: IV  TM Distance: <3 FB Neck ROM: limited    Dental  (+) Chipped, Missing, Poor Dentition   Pulmonary asthma , pneumonia, COPD, former smoker,     + decreased breath sounds      Cardiovascular hypertension, + dysrhythmias Atrial Fibrillation  Rhythm:irregular     Neuro/Psych  Neuromuscular disease CVA    GI/Hepatic neg GERD  ,  Endo/Other  diabetes  Renal/GU      Musculoskeletal  (+) Arthritis ,   Abdominal   Peds  Hematology  (+) Blood dyscrasia, anemia , Discussed INR 1.5 w surgeon and patient. Pt currently short of breath on O2 [place]. Relative risk of GA/ETT vs SAB/coag weighed. Both agree that avoiding ETT is reasonable. Pt specifically requests spinal.   Anesthesia Other Findings Past Medical History: No date: Atrial fibrillation ([facility]) No date: Bronchitis No date: Cancer [facility])     Comment:  Left leg growth, kidneys, lungs and breasts No date: Carcinoma of unknown primary ([place]) No date: COPD (chronic obstructive pulmonary disease) ([facility]) No date: Diabetes mellitus without complication ([facility]) No date: Hyperlipidemia No date: Hypertension No date: Pancreatitis No date: Pneumonia No date: Stroke [facility])     Comment:  TIA's No date: Vitamin B12 deficiency  Past Surgical History: 1975: ABDOMINAL HYSTERECTOMY 1989: BLADDER SUSPENSION No date: COLONOSCOPY 01/25/2018: COLONOSCOPY WITH PROPOFOL; N/A     Comment:  Procedure: COLONOSCOPY WITH PROPOFOL;  Surgeon: [name], MD;  Location: [facility] ENDOSCOPY;  Service:               Endoscopy;  Laterality: N/A; 01/24/2018: ESOPHAGOGASTRODUODENOSCOPY (EGD) WITH PROPOFOL; N/A     Comment:  Procedure:  ESOPHAGOGASTRODUODENOSCOPY (EGD) WITH               PROPOFOL;  Surgeon: [name], MD;  Location: [facility]               ENDOSCOPY;  Service: Endoscopy;  Laterality: N/A;  BMI    Body Mass Index: 26.95 kg/m      Reproductive/Obstetrics                           Anesthesia Physical Anesthesia Plan  ASA: IV and emergent  Anesthesia Plan: Spinal and MAC   Post-op Pain Management:    Induction: Intravenous  PONV Risk Score and Plan: Treatment may vary due to age or medical condition, TIVA and Propofol infusion  Airway Management Planned: Nasal Cannula and Natural Airway  Additional Equipment:   Intra-op Plan:   Post-operative Plan:   Informed Consent: I have reviewed the patients History and Physical, chart, labs and discussed the procedure including the risks, benefits and alternatives for the proposed anesthesia with the patient or authorized representative who has indicated his/her understanding and acceptance.     Dental Advisory Given  Plan Discussed with: CRNA and Surgeon  Anesthesia Plan Comments:        Anesthesia Quick Evaluation

## 2019-06-03 NOTE — Progress Notes (Signed)
Paged MD Eugenie Norrie for bladder scan of [phone].  Order for 1x I/O cath.

## 2019-06-03 NOTE — Anesthesia Post-op Follow-up Note (Signed)
Anesthesia QCDR form completed.        

## 2019-06-03 NOTE — Op Note (Signed)
OPERATIVE NOTE  DATE OF SURGERY:  06/03/2019  PATIENT NAME:  Monica [name]   DOB: [birth year]  MRN: [id number]  PRE-OPERATIVE DIAGNOSIS: Left femoral neck fracture  POST-OPERATIVE DIAGNOSIS:  Same  PROCEDURE:  Left hip hemiarthroplasty  SURGEON:  Marciano Sequin. M.D.  ANESTHESIA: spinal  ESTIMATED BLOOD LOSS: 75 mL  FLUIDS REPLACED: 700 mL of crystalloid  DRAINS: 2 medium drains to a Hemovac reservoir  IMPLANTS UTILIZED: DePuy size 4 Summit femoral stem (cemented), 12 mm Cementralizer, 42 mm OD Cathcart hip ball, -3 mm tapered spacer, and a size 4 femoral cement restrictor  INDICATIONS FOR SURGERY: Monica [name] is a 85 y.o. year old female who fell and sustained a displaced left femoral neck fracture. After discussion of the risks and benefits of surgical intervention, the patient expressed understanding of the risks benefits and agree with plans for hip hemiarthroplasty.   The risks, benefits, and alternatives were discussed at length including but not limited to the risks of infection, bleeding, nerve injury, stiffness, blood clots, the need for revision surgery, limb length inequality, dislocation, cardiopulmonary complications, among others, and they were willing to proceed.  PROCEDURE IN DETAIL: The patient was brought into the operating room and, after adequate spinal anesthesia was achieved, patient was placed in a right lateral decubitus position. Axillary roll was placed and all bony prominences were well-padded. The patient's left hip was cleaned and prepped with alcohol and DuraPrep and draped in the usual sterile fashion. A "timeout" was performed as per usual protocol. A lateral curvilinear incision was made gently curving towards the posterior superior iliac spine. The IT band was incised in line with the skin incision and the fibers of the gluteus maximus were split in line. The piriformis tendon was identified, skeletonized, and incised at its insertion to the proximal  femur and reflected posteriorly. A T type posterior capsulotomy was performed. The femoral head was then removed using a corkscrew device. The femoral head was measured using calipers and ring gauges and determined to be 42 mm in diameter.The femoral neck cut was performed using an oscillating saw. The acetabulum was inspected for any bony fragments. The articular surface was in good condition.  Attention was then directed to the proximal femur. A pilot hole for preparation of the proximal femoral canal was created using a high-speed bur. The femoral canal finder was inserted followed by insertion of the conical reamer. Serial broaches were inserted up to a size 4 broach. Calcar region was planed and a trial reduction was performed using a 42 mm OD Cathcart ball with a -3 mm neck length. Good equalization of limb lengths was appreciated and excellent stability was noted both anteriorly and posteriorly. Trial components were removed. The femoral canal was sized and was felt that a size 4 cement restrictor was appropriate. The cement restrictor was inserted to the appropriate depth in the femoral canal was irrigated with copious amounts of fluid using the pulse lavage and suctioned dry. The femoral canal was then packed with vaginal packing soaked in dilute Neo-Synephrine. Polymethylmethacrylate cement was prepared in the usual fashion using a vacuum mixer. Vaginal packing was removed and the canal again irrigated and suctioned dry. The polymethylmethacrylate cement was inserted in retrograde fashion and pressurized. The size 4 Summit femoral component with a 12 mm Cementralizer was positioned and impacted into place. Excess cement was removed using [employer] fast streamer. After adequate curing of the cement, the Morse taper was cleaned and dried. A 42 mm outer diameter Cathcart  hip ball with a -3 mm tapered spacer was placed on the trunnion and impacted into place. The acetabulum was again irrigated and suctioned dry,  making sure to inspect for any residal bony debris. The femoral head was then reduced and placed through a range of motion. Excellent stability was noted both anteriorly and posteriorly. Good equalization of limb lengths was appreciated.   The wound was irrigated with copious amounts of normal saline with antibiotic solution and suctioned dry. Good hemostasis was appreciated. The posterior capsulotomy was repaired using #5 Ethibond. Piriformis tendon was reapproximated to the undersurface of the gluteus medius tendon using #5 Ethibond. Two medium drains were placed in the wound bed and brought out through separate stab incisions to be attached to a Hemovac reservoir. The IT band was reapproximated using interrupted sutures of #1 Vicryl. Subcutaneous tissue was proximal phalanx using first #0 Vicryl followed by #2-0 Vicryl. The skin was closed with skin staples.  The patient tolerated the procedure well and was transported to the recovery room in stable condition.   Marciano Sequin., M.D.

## 2019-06-03 NOTE — Anesthesia Procedure Notes (Signed)
Date/Time: 06/03/2019 8:15 AM Performed by: Doreen Salvage, CRNA Pre-anesthesia Checklist: Patient identified, Emergency Drugs available, Suction available and Patient being monitored Patient Re-evaluated:Patient Re-evaluated prior to induction Oxygen Delivery Method: Supernova nasal CPAP Induction Type: IV induction Dental Injury: Teeth and Oropharynx as per pre-operative assessment  Comments: Nasal cannula with etCO2 monitoring

## 2019-06-03 NOTE — Anesthesia Procedure Notes (Signed)
Date/Time: 06/03/2019 9:15 AM Performed by: Doreen Salvage, CRNA Pre-anesthesia Checklist: Patient identified, Emergency Drugs available, Suction available and Patient being monitored Patient Re-evaluated:Patient Re-evaluated prior to induction Oxygen Delivery Method: Simple face mask Induction Type: IV induction Dental Injury: Teeth and Oropharynx as per pre-operative assessment

## 2019-06-03 NOTE — Anesthesia Postprocedure Evaluation (Signed)
Anesthesia Post Note  Patient: Monica [name]  Procedure(s) Performed: ARTHROPLASTY BIPOLAR HIP (HEMIARTHROPLASTY) (Left )  Patient location during evaluation: PACU Anesthesia Type: MAC and Spinal Level of consciousness: oriented and awake and alert Pain management: pain level controlled Vital Signs Assessment: post-procedure vital signs reviewed and stable Respiratory status: spontaneous breathing, respiratory function stable, patient connected to nasal cannula oxygen and nonlabored ventilation Cardiovascular status: blood pressure returned to baseline and stable Postop Assessment: no headache, no backache and no apparent nausea or vomiting Anesthetic complications: no Comments: Baseline O2 of 92% in PACU     Last Vitals:  Vitals:   06/03/19 1539 06/03/19 1639  BP: (!) 110/48 126/61  Pulse: (!) 102 (!) 107  Resp: 17 18  Temp: 36.6 C 36.8 C  SpO2: 99% 94%    Last Pain:  Vitals:   06/03/19 1539  TempSrc: Oral  PainSc:                  Alphonsus Sias

## 2019-06-04 ENCOUNTER — Inpatient Hospital Stay: Payer: Medicare Other

## 2019-06-04 ENCOUNTER — Encounter: Payer: Self-pay | Admitting: Orthopedic Surgery

## 2019-06-04 DIAGNOSIS — S72002A Fracture of unspecified part of neck of left femur, initial encounter for closed fracture: Secondary | ICD-10-CM | POA: Diagnosis not present

## 2019-06-04 DIAGNOSIS — R0602 Shortness of breath: Secondary | ICD-10-CM | POA: Diagnosis not present

## 2019-06-04 DIAGNOSIS — E871 Hypo-osmolality and hyponatremia: Secondary | ICD-10-CM | POA: Diagnosis not present

## 2019-06-04 DIAGNOSIS — J449 Chronic obstructive pulmonary disease, unspecified: Secondary | ICD-10-CM | POA: Diagnosis not present

## 2019-06-04 DIAGNOSIS — E119 Type 2 diabetes mellitus without complications: Secondary | ICD-10-CM | POA: Diagnosis not present

## 2019-06-04 DIAGNOSIS — I10 Essential (primary) hypertension: Secondary | ICD-10-CM | POA: Diagnosis not present

## 2019-06-04 LAB — GLUCOSE, CAPILLARY
Glucose-Capillary: 187 mg/dL — ABNORMAL HIGH (ref 70–99)
Glucose-Capillary: 148 mg/dL — ABNORMAL HIGH (ref 70–99)
Glucose-Capillary: 130 mg/dL — ABNORMAL HIGH (ref 70–99)
Glucose-Capillary: 189 mg/dL — ABNORMAL HIGH (ref 70–99)
Glucose-Capillary: 172 mg/dL — ABNORMAL HIGH (ref 70–99)
Glucose-Capillary: 125 mg/dL — ABNORMAL HIGH (ref 70–99)

## 2019-06-04 LAB — CBC
HCT: 32.5 % — ABNORMAL LOW (ref 36.0–46.0)
Hemoglobin: 10.8 g/dL — ABNORMAL LOW (ref 12.0–15.0)
MCH: 27.1 pg (ref 26.0–34.0)
MCHC: 33.2 g/dL (ref 30.0–36.0)
MCV: 81.5 fL (ref 80.0–100.0)
Platelets: 248 10*3/uL (ref 150–400)
RBC: 3.99 MIL/uL (ref 3.87–5.11)
RDW: 14.1 % (ref 11.5–15.5)
WBC: 10.9 10*3/uL — ABNORMAL HIGH (ref 4.0–10.5)
nRBC: 0 % (ref 0.0–0.2)

## 2019-06-04 LAB — PROTIME-INR
INR: 2 — ABNORMAL HIGH (ref 0.8–1.2)
Prothrombin Time: 22.3 seconds — ABNORMAL HIGH (ref 11.4–15.2)

## 2019-06-04 MED ORDER — FUROSEMIDE 10 MG/ML IJ SOLN
20.0000 mg | Freq: Once | INTRAMUSCULAR | Status: AC
  Administered 2019-06-04: 10:00:00 20 mg via INTRAVENOUS

## 2019-06-04 MED ORDER — WARFARIN SODIUM 4 MG PO TABS
4.0000 mg | ORAL_TABLET | Freq: Once | ORAL | Status: AC
  Administered 2019-06-04: 4 mg via ORAL
  Filled 2019-06-04: qty 1

## 2019-06-04 MED ORDER — FUROSEMIDE 10 MG/ML IJ SOLN
20.0000 mg | Freq: Once | INTRAMUSCULAR | Status: AC
  Administered 2019-06-04: 20 mg via INTRAVENOUS
  Filled 2019-06-04: qty 4

## 2019-06-04 MED ORDER — FUROSEMIDE 10 MG/ML IJ SOLN: 40.0000 mg | Freq: Once | INTRAMUSCULAR | Status: DC

## 2019-06-04 MED ORDER — LEVOFLOXACIN IN D5W 250 MG/50ML IV SOLN
250.0000 mg | INTRAVENOUS | Status: DC
  Administered 2019-06-04: 250 mg via INTRAVENOUS
  Filled 2019-06-04 (×2): qty 50

## 2019-06-04 NOTE — Care Management Important Message (Signed)
Important Message  Patient Details  Name: Monica [name] MRN: [id number] Date of Birth: [birth year]   Medicare Important Message Given:  Yes     [name] 06/04/2019, 11:29 AM

## 2019-06-04 NOTE — Progress Notes (Deleted)
Monica [name] at [place] NAME: Monica [name]    MR#:  [id number]  DATE OF BIRTH:  [birth year]  SUBJECTIVE: Status post surgery for the left hip.  Patient short of breath, O2 sats 83% on room air, chest x-ray showed pulmonary vascular congestion getting IV Lasix.  CHIEF COMPLAINT:   REVIEW OF SYSTEMS:  CONSTITUTIONAL: No fever, fatigue or weakness.  EYES: No blurred or double vision.  EARS, NOSE, AND THROAT: No tinnitus or ear pain.  RESPIRATORY: No cough, shortness of breath, wheezing or hemoptysis.  CARDIOVASCULAR: No chest pain, orthopnea, edema.  GASTROINTESTINAL: No nausea, vomiting, diarrhea or abdominal pain.  GENITOURINARY: No dysuria, hematuria.  ENDOCRINE: No polyuria, nocturia,  HEMATOLOGY: No anemia, easy bruising or bleeding SKIN: No rash or lesion. MUSCULOSKELETAL: Reporting left hip pain no joint pain or arthritis.   NEUROLOGIC: No tingling, numbness, weakness.  PSYCHIATRY: No anxiety or depression.   DRUG ALLERGIES:   Allergies  Allergen Reactions  . Sulfa Antibiotics Swelling  . Celecoxib Nausea And Vomiting  . Acetaminophen Itching  . Codeine Rash  . Lyrica [Pregabalin] Rash  . Penicillin G Rash    Has patient had a PCN reaction causing immediate rash, facial/tongue/throat swelling, SOB or lightheadedness with hypotension: Yes Has patient had a PCN reaction causing severe rash involving mucus membranes or skin necrosis: No Has patient had a PCN reaction that required hospitalization: No Has patient had a PCN reaction occurring within the last 10 years: Unknown If all of the above answers are "NO", then may proceed with Cephalosporin use.  Marland Kitchen Petrolatum-Zinc Oxide Rash    VITALS:  Blood pressure (!) 169/64, pulse (!) 102, temperature 98 F (36.7 C), temperature source Oral, resp. rate 20, height 5\' 4"  (1.626 m), weight 71.2 kg, SpO2 92 %.  PHYSICAL EXAMINATION:  GENERAL:  85 y.o.-year-old patient lying in the bed  with no acute distress.  EYES: Pupils equal, round, reactive to light and accommodation. No scleral icterus. Extraocular muscles intact.  HEENT: Head atraumatic, normocephalic. Oropharynx and nasopharynx clear.  NECK:  Supple, no jugular venous distention. No thyroid enlargement, no tenderness.  LUNGS: Normal breath sounds bilaterally, no wheezing, rales,rhonchi or crepitation. No use of accessory muscles of respiration.  CARDIOVASCULAR: S1, S2 normal. No murmurs, rubs, or gallops.  ABDOMEN: Soft, nontender, nondistended. Bowel sounds present. EXTREMITIES: Left hip is tender and externally rotated no pedal edema, cyanosis, or clubbing.  NEUROLOGIC: Cranial nerves II through XII are intact.  Sensation intact. Gait not checked for safety PSYCHIATRIC: The patient is alert and oriented x 3.  SKIN: No obvious rash, lesion, or ulcer.    LABORATORY PANEL:   CBC Recent Labs  Lab 06/04/19 0342  WBC 10.9*  HGB 10.8*  HCT 32.5*  PLT 248   ------------------------------------------------------------------------------------------------------------------  Chemistries  Recent Labs  Lab 05/31/19 0353  06/03/19 0522  NA 126*   < > 128*  K 4.0   < > 4.5  CL 92*   < > 93*  CO2 24   < > 24  GLUCOSE 170*   < > 169*  BUN 16   < > 29*  CREATININE 0.79   < > 0.89  CALCIUM 9.4   < > 9.0  AST 31  --   --   ALT 21  --   --   ALKPHOS 103  --   --   BILITOT 1.1  --   --    < > = values in this  interval not displayed.   ------------------------------------------------------------------------------------------------------------------  Cardiac Enzymes No results for input(s): TROPONINI in the last 168 hours. ------------------------------------------------------------------------------------------------------------------  RADIOLOGY:  Dg Chest 2 View  Result Date: 06/04/2019 CLINICAL DATA:  Shortness of breath EXAM: CHEST - 2 VIEW COMPARISON:  03/22/2017 FINDINGS: Low lung volumes. Mild  elevation of the right hemidiaphragm. Bibasilar opacities, favor atelectasis, but pneumonia not excluded, particularly in the left base. Suspect small left effusion. Heart is borderline in size. Mild vascular congestion. IMPRESSION: Borderline heart size with mild vascular congestion. Bibasilar opacities could reflect atelectasis or infiltrates. Small left effusion. Electronically Signed   By: Rolm Baptise M.D.   On: 06/04/2019 09:29   Dg Hip Port Unilat With Pelvis 1v Left  Result Date: 06/03/2019 CLINICAL DATA:  Left hip replacement for a left femoral neck fracture. EXAM: DG HIP (WITH OR WITHOUT PELVIS) 1V PORT LEFT COMPARISON:  05/31/2019. FINDINGS: Interval left hip prosthesis in satisfactory position and alignment. No fracture or dislocation seen. IMPRESSION: Satisfactory postoperative appearance of a left hip prosthesis. Electronically Signed   By: Claudie Revering M.D.   On: 06/03/2019 13:01    EKG:   Orders placed or performed during the hospital encounter of 08/08/16  . ED EKG  . ED EKG    ASSESSMENT AND PLAN:   1.  Left hip fracture secondary to mechanical fall.   Pain management as needed Orthopedics Dr. Marry Guan is consulted and are planning to do any surgery today or tomorrow in view of her INR being high Coumadin is on hold INR is at 2.5 Anticipating surgery on Sunday, if INR is not improving will consider small dose of vitamin K    2.  Hyponatremia- could be due to diuretics, patient received gentle hydration.     Sodium at 126 previously it was normal in 2019.  Improving sodium at 131 Hydrochlorothiazide on hold  3.  Hypertension.  We will continue Lotrel and hold HCTZ, Lasix in view of hyponatremia, recheck sodium tomorrow.  4.  Type II diabetes mellitus.  The patient will be placed on supplemental coverage with NovoLog.  5.   Atrial fibrillation.  We will holding off Coumadin at this time.  6.   COPD.  No current exacerbation.  We will continue theophylline for now  and place the patient on PRN duo nebs.  7.  Hyperkalemia-ACE inhibitor on hold.  Giving Veltassa repeat potassium  DVT prophylaxis.  SCDs.  Coumadin is being held off.     All the records are reviewed and case discussed with Care Management/Social Workerr. Management plans discussed with the patient, susan -daughter and they are in agreement.  CODE STATUS: fc   TOTAL TIME TAKING CARE OF THIS PATIENT: 36  minutes.   POSSIBLE D/C IN 2-3 DAYS, DEPENDING ON CLINICAL CONDITION.  Note: This dictation was prepared with Dragon dictation along with smaller phrase technology. Any transcriptional errors that result from this process are unintentional.   Epifanio Lesches M.D on 06/04/2019 at 11:06 AM  Between 7am to 6pm - Pager - [phone] After 6pm go to [email or web address] - password [email or web address] [facility]  Office  [phone]  CC: Primary care physician; [name], NP

## 2019-06-04 NOTE — Progress Notes (Signed)
[facility] for warfarin Indication: atrial fibrillation  Allergies  Allergen Reactions  . Sulfa Antibiotics Swelling  . Celecoxib Nausea And Vomiting  . Acetaminophen Itching  . Codeine Rash  . Lyrica [Pregabalin] Rash  . Penicillin G Rash    Has patient had a PCN reaction causing immediate rash, facial/tongue/throat swelling, SOB or lightheadedness with hypotension: Yes Has patient had a PCN reaction causing severe rash involving mucus membranes or skin necrosis: No Has patient had a PCN reaction that required hospitalization: No Has patient had a PCN reaction occurring within the last 10 years: Unknown If all of the above answers are "NO", then may proceed with Cephalosporin use.  Marland Kitchen Petrolatum-Zinc Oxide Rash    Patient Measurements: Height: 5\' 4"  (162.6 cm) Weight: 157 lb (71.2 kg) IBW/kg (Calculated) : 54.7  Vital Signs: Temp: 98 F (36.7 C) (07/20 0748) Temp Source: Oral (07/20 0748) BP: 135/59 (07/20 0748) Pulse Rate: 90 (07/20 0805)  Labs: Recent Labs    06/02/19 0419 06/03/19 0522 06/04/19 0342  HGB  --   --  10.8*  HCT  --   --  32.5*  PLT  --   --  248  LABPROT 21.1* 17.7* 22.3*  INR 1.9* 1.5* 2.0*  CREATININE 0.83 0.89  --     Estimated Creatinine Clearance: 39.8 mL/min (by C-G formula based on SCr of 0.89 mg/dL).   Medical History: Past Medical History:  Diagnosis Date  . Atrial fibrillation ([place])   . Bronchitis   . Cancer ([place])    Left leg growth, kidneys, lungs and breasts  . Carcinoma of unknown primary ([place])   . COPD (chronic obstructive pulmonary disease) ([place])   . Diabetes mellitus without complication ([place])   . Hyperlipidemia   . Hypertension   . Pancreatitis   . Pneumonia   . Stroke ([place])    TIA's  . Vitamin B12 deficiency     Assessment: 85 year old female presented after a fall resulting in left femoral neck fracture. Patient now s/p left hip hemiarthroplasty. Patient on warfarin PTA for  afib. Home dose 4 mg daily except 2 mg on Tuesdays. Pharmacy consulted to dose warfarin. INR now sub-therapeutic s/p Vit K.  Date INR Dose 7/19 1.5         5mg  7/20     2.0  Goal of Therapy:  INR 2-3 Monitor platelets by anticoagulation protocol: Yes   Plan:  Will resume home dose as patient was therapeutic on arrival. Will order Warfarin 4mg  today. INR with morning labs. CBC per protocol.  Paulina Fusi, PharmD, BCPS 06/04/2019 10:05 AM

## 2019-06-04 NOTE — Progress Notes (Signed)
PT Cancellation Note  Patient Details Name: ALEANE WESENBERG MRN: [id number] DOB: 16-Feb-1926   Cancelled Treatment:    Reason Eval/Treat Not Completed: (Consult received and chart reviewed.  Patient initially conversational with therapist, relaying details of home environment [facility]) and prior level of function (ambulatory with rollator, typically amb to/from dining hall without difficulty; denies additional falls).  However, once therapist attempted supine LE therex, or introduced idea of OOB efforts, patient demeanor promptly changed.  Adamantly refused participation with therex or mobility, stating "Dr. Marry Guan didn't tell me you would be here or that I would have to do this today".  Attempted to review standard progression and provide additional support/encouragement as needed; unable to redirect, and patient intermittently agitated with continued attempts.  Session attempt ended.  Patient very clear that she was not going to engage in mobility of any type this date.  Will continue efforts next date as medically appropriate and patient agreeable.)   Luay Balding H. Owens Shark, PT, DPT, NCS 06/04/19, 1:49 PM [phone]

## 2019-06-04 NOTE — TOC Progression Note (Signed)
Transition of Care [facility]) - Progression Note    Patient Details  Name: Monica [name] MRN: [id number] Date of Birth: 01/30/1926  Transition of Care [facility]) CM/SW Contact  Skye Plamondon, Lenice Llamas Phone Number: [phone]  06/04/2019, 11:40 AM  Clinical Narrative: PT is recommending SNF. Clinical Education officer, museum (CSW) met with patient and presented bed offers. [facility] chose [facility]. [facility] liaison is aware of accepted bed offer. CSW contacted patient's daughter [name] and made her aware of above.       Expected Discharge Plan: [place] Barriers to Discharge: Continued Medical Work up  Expected Discharge Plan and Services Expected Discharge Plan: [place] In-house Referral: Clinical Social Work Discharge Planning Services: CM Consult Post Acute Care Choice: [place] arrangements for the past 2 months: [place]                                       Social Determinants of Health (SDOH) Interventions    Readmission Risk Interventions No flowsheet data found.

## 2019-06-04 NOTE — Progress Notes (Addendum)
OT Cancellation Note  Patient Details Name: Monica [name] MRN: [id number] DOB: September 25, 1926   Cancelled Treatment:    Reason Eval/Treat Not Completed: Other (comment). Consult received, chart reviewed. Pt with nursing for pt care. Will re-attempt at later date/time as pt is available and medically appropriate.   On 2nd attempt, pt refusing to participate stating, "I'm just going to lay here today. I'm not doing anything. The doctor did not tell me you were coming. Emotional support and active listening utilized. Rearranged pt's tray table per her request." Pt agreeable to OT re-attempting next date.   Jeni Salles, MPH, MS, OTR/L ascom [phone] 06/04/19, 10:29 AM

## 2019-06-04 NOTE — Progress Notes (Signed)
   Subjective: 1 Day Post-Op Procedure(s) (LRB): ARTHROPLASTY BIPOLAR HIP (HEMIARTHROPLASTY) (Left) Patient reports pain as mild.   Patient is well, and has had no acute complaints or problems We will start therapy today.  Plan is to go Rehab after hospital stay. no nausea and no vomiting Patient denies any chest pains or shortness of breath. Patient resting very well this morning.  Voicing no complaints.  States that she slept well during the night.  Objective: Vital signs in last 24 hours: Temp:  [97.8 F (36.6 C)-99.9 F (37.7 C)] 98.5 F (36.9 C) (07/20 0401) Pulse Rate:  [96-110] 99 (07/20 0401) Resp:  [17-24] 20 (07/20 0401) BP: (105-141)/(35-75) 127/50 (07/20 0401) SpO2:  [88 %-99 %] 98 % (07/20 0401) well approximated incision Heels are non tender and elevated off the bed using rolled towels Intake/Output from previous day: 07/19 0701 - 07/20 0700 In: 1239.6 [P.O.:220; I.V.:969.6; IV Piggyback:50] Out: 790 [Urine:630; Drains:85; Blood:75] Intake/Output this shift: Total I/O In: 389.6 [P.O.:220; I.V.:169.6] Out: 275 [Urine:255; Drains:20]  Recent Labs    06/04/19 0342  HGB 10.8*   Recent Labs    06/04/19 0342  WBC 10.9*  RBC 3.99  HCT 32.5*  PLT 248   Recent Labs    06/02/19 0419 06/03/19 0522  NA 126* 128*  K 4.6 4.5  CL 93* 93*  CO2 25 24  BUN 23 29*  CREATININE 0.83 0.89  GLUCOSE 161* 169*  CALCIUM 9.0 9.0   Recent Labs    06/03/19 0522 06/04/19 0342  INR 1.5* 2.0*    EXAM General - Patient is Alert and Confused Extremity - Neurologically intact Neurovascular intact Sensation intact distally Intact pulses distally Dorsiflexion/Plantar flexion intact No cellulitis present Compartment soft Dressing - scant drainage Motor Function - intact, moving foot and toes well on exam.    Past Medical History:  Diagnosis Date  . Atrial fibrillation ([place])   . Bronchitis   . Cancer ([place])    Left leg growth, kidneys, lungs and breasts  .  Carcinoma of unknown primary ([place])   . COPD (chronic obstructive pulmonary disease) ([place])   . Diabetes mellitus without complication ([place])   . Hyperlipidemia   . Hypertension   . Pancreatitis   . Pneumonia   . Stroke ([place])    TIA's  . Vitamin B12 deficiency     Assessment/Plan: 1 Day Post-Op Procedure(s) (LRB): ARTHROPLASTY BIPOLAR HIP (HEMIARTHROPLASTY) (Left) Active Problems:   'light-for-dates' infant with signs of fetal malnutrition   Closed left hip fracture (HCC)  Estimated body mass index is 26.95 kg/m as calculated from the following:   Height as of this encounter: 5\' 4"  (1.626 m).   Weight as of this encounter: 71.2 kg. Advance diet Up with therapy D/C IV fluids Plan for discharge tomorrow Discharge to SNF when bed offer available  Labs: Hemoglobin 10.8 with the INR 2.0 DVT Prophylaxis - Coumadin, Foot Pumps and TED hose Weight-Bearing as tolerated to left leg D/C O2 and Pulse OX and try on Room Air Begin working on bowel movement Will need to follow-up in congenital clinic 2 weeks postop Continue Lovenox 40 mg injections daily for 2 weeks after discharge   R. Crystal [place] 06/04/2019, 6:54 AM

## 2019-06-05 DIAGNOSIS — S72002A Fracture of unspecified part of neck of left femur, initial encounter for closed fracture: Secondary | ICD-10-CM | POA: Diagnosis not present

## 2019-06-05 DIAGNOSIS — J449 Chronic obstructive pulmonary disease, unspecified: Secondary | ICD-10-CM | POA: Diagnosis not present

## 2019-06-05 DIAGNOSIS — L8990 Pressure ulcer of unspecified site, unspecified stage: Secondary | ICD-10-CM | POA: Insufficient documentation

## 2019-06-05 DIAGNOSIS — I10 Essential (primary) hypertension: Secondary | ICD-10-CM | POA: Diagnosis not present

## 2019-06-05 DIAGNOSIS — E871 Hypo-osmolality and hyponatremia: Secondary | ICD-10-CM | POA: Diagnosis not present

## 2019-06-05 DIAGNOSIS — E119 Type 2 diabetes mellitus without complications: Secondary | ICD-10-CM | POA: Diagnosis not present

## 2019-06-05 LAB — BASIC METABOLIC PANEL
Anion gap: 8 (ref 5–15)
BUN: 23 mg/dL (ref 8–23)
CO2: 25 mmol/L (ref 22–32)
Calcium: 8.6 mg/dL — ABNORMAL LOW (ref 8.9–10.3)
Chloride: 91 mmol/L — ABNORMAL LOW (ref 98–111)
Creatinine, Ser: 0.72 mg/dL (ref 0.44–1.00)
GFR calc Af Amer: >60 mL/min (ref >60)
GFR calc non Af Amer: >60 mL/min (ref >60)
Glucose, Bld: 190 mg/dL — ABNORMAL HIGH (ref 70–99)
Potassium: 4.4 mmol/L (ref 3.5–5.1)
Sodium: 124 mmol/L — ABNORMAL LOW (ref 135–145)

## 2019-06-05 LAB — GLUCOSE, CAPILLARY
Glucose-Capillary: 155 mg/dL — ABNORMAL HIGH (ref 70–99)
Glucose-Capillary: 118 mg/dL — ABNORMAL HIGH (ref 70–99)
Glucose-Capillary: 123 mg/dL — ABNORMAL HIGH (ref 70–99)
Glucose-Capillary: 154 mg/dL — ABNORMAL HIGH (ref 70–99)
Glucose-Capillary: 163 mg/dL — ABNORMAL HIGH (ref 70–99)
Glucose-Capillary: 148 mg/dL — ABNORMAL HIGH (ref 70–99)

## 2019-06-05 LAB — PROTIME-INR
INR: 2.5 — ABNORMAL HIGH (ref 0.8–1.2)
Prothrombin Time: 26.7 seconds — ABNORMAL HIGH (ref 11.4–15.2)

## 2019-06-05 LAB — PROCALCITONIN: Procalcitonin: 0.15 ng/mL

## 2019-06-05 LAB — SURGICAL PATHOLOGY

## 2019-06-05 MED ORDER — INSULIN ASPART 100 UNIT/ML ~~LOC~~ SOLN
0.0000 [IU] | Freq: Three times a day (TID) | SUBCUTANEOUS | Status: DC
  Administered 2019-06-05: 18:00:00 2 [IU] via SUBCUTANEOUS
  Administered 2019-06-06 (×3): 1 [IU] via SUBCUTANEOUS
  Administered 2019-06-07 – 2019-06-08 (×4): 2 [IU] via SUBCUTANEOUS
  Administered 2019-06-08: 1 [IU] via SUBCUTANEOUS
  Administered 2019-06-08 – 2019-06-09 (×2): 2 [IU] via SUBCUTANEOUS
  Administered 2019-06-09: 1 [IU] via SUBCUTANEOUS
  Filled 2019-06-05 (×13): qty 1

## 2019-06-05 MED ORDER — LEVOFLOXACIN 500 MG PO TABS
500.0000 mg | ORAL_TABLET | Freq: Every day | ORAL | Status: DC
  Administered 2019-06-05 – 2019-06-09 (×5): 500 mg via ORAL
  Filled 2019-06-05 (×5): qty 1

## 2019-06-05 MED ORDER — GUAIFENESIN 100 MG/5ML PO SOLN
10.0000 mL | Freq: Four times a day (QID) | ORAL | Status: DC | PRN
  Administered 2019-06-05: 200 mg via ORAL
  Filled 2019-06-05 (×2): qty 10

## 2019-06-05 MED ORDER — WARFARIN SODIUM 2 MG PO TABS
2.0000 mg | ORAL_TABLET | Freq: Once | ORAL | Status: AC
  Administered 2019-06-05: 2 mg via ORAL
  Filled 2019-06-05: qty 1

## 2019-06-05 NOTE — Progress Notes (Signed)
[place] at [place] NAME: Monica [name]    MR#:  [id number]  DATE OF BIRTH:  13-Feb-1926  SUBJECTIVE: Status post surgery for the left hip.  Patient short of breath, O2 sats 83% on room air, chest x-ray showed pulmonary vascular congestion getting IV Lasix.  CHIEF COMPLAINT:   REVIEW OF SYSTEMS:  CONSTITUTIONAL: No fever, fatigue or weakness.  EYES: No blurred or double vision.  EARS, NOSE, AND THROAT: No tinnitus or ear pain.  RESPIRATORY: No cough, shortness of breath, wheezing or hemoptysis.  CARDIOVASCULAR: No chest pain, orthopnea, edema.  GASTROINTESTINAL: No nausea, vomiting, diarrhea or abdominal pain.  GENITOURINARY: No dysuria, hematuria.  ENDOCRINE: No polyuria, nocturia,  HEMATOLOGY: No anemia, easy bruising or bleeding SKIN: No rash or lesion. MUSCULOSKELETAL: Reporting left hip pain no joint pain or arthritis.   NEUROLOGIC: No tingling, numbness, weakness.  PSYCHIATRY: No anxiety or depression.   DRUG ALLERGIES:   Allergies  Allergen Reactions  . Sulfa Antibiotics Swelling  . Celecoxib Nausea And Vomiting  . Acetaminophen Itching  . Codeine Rash  . Lyrica [Pregabalin] Rash  . Penicillin G Rash    Has patient had a PCN reaction causing immediate rash, facial/tongue/throat swelling, SOB or lightheadedness with hypotension: Yes Has patient had a PCN reaction causing severe rash involving mucus membranes or skin necrosis: No Has patient had a PCN reaction that required hospitalization: No Has patient had a PCN reaction occurring within the last 10 years: Unknown If all of the above answers are "NO", then may proceed with Cephalosporin use.  Marland Kitchen Petrolatum-Zinc Oxide Rash    VITALS:  Blood pressure 126/70, pulse 99, temperature 98 F (36.7 C), resp. rate 20, height 5\' 4"  (1.626 m), weight 71.2 kg, SpO2 95 %.  PHYSICAL EXAMINATION:  GENERAL:  85 y.o.-year-old patient lying in the bed with no acute distress.  EYES:  Pupils equal, round, reactive to light and accommodation. No scleral icterus. Extraocular muscles intact.  HEENT: Head atraumatic, normocephalic. Oropharynx and nasopharynx clear.  NECK:  Supple, no jugular venous distention. No thyroid enlargement, no tenderness.  LUNGS: Normal breath sounds bilaterally, no wheezing, rales,rhonchi or crepitation. No use of accessory muscles of respiration.  CARDIOVASCULAR: S1, S2 normal. No murmurs, rubs, or gallops.  ABDOMEN: Soft, nontender, nondistended. Bowel sounds present. EXTREMITIES: Left hip is tender and externally rotated no pedal edema, cyanosis, or clubbing.  NEUROLOGIC: Cranial nerves II through XII are intact.  Sensation intact. Gait not checked for safety PSYCHIATRIC: The patient is alert and oriented x 3.  SKIN: No obvious rash, lesion, or ulcer.    LABORATORY PANEL:   CBC Recent Labs  Lab 06/04/19 0342  WBC 10.9*  HGB 10.8*  HCT 32.5*  PLT 248   ------------------------------------------------------------------------------------------------------------------  Chemistries  Recent Labs  Lab 05/31/19 0353  06/03/19 0522  NA 126*   < > 128*  K 4.0   < > 4.5  CL 92*   < > 93*  CO2 24   < > 24  GLUCOSE 170*   < > 169*  BUN 16   < > 29*  CREATININE 0.79   < > 0.89  CALCIUM 9.4   < > 9.0  AST 31  --   --   ALT 21  --   --   ALKPHOS 103  --   --   BILITOT 1.1  --   --    < > = values in this interval not displayed.   ------------------------------------------------------------------------------------------------------------------  Cardiac Enzymes No results for input(s): TROPONINI in the last 168 hours. ------------------------------------------------------------------------------------------------------------------  RADIOLOGY:  Dg Chest 2 View  Result Date: 06/04/2019 CLINICAL DATA:  Shortness of breath EXAM: CHEST - 2 VIEW COMPARISON:  03/22/2017 FINDINGS: Low lung volumes. Mild elevation of the right hemidiaphragm.  Bibasilar opacities, favor atelectasis, but pneumonia not excluded, particularly in the left base. Suspect small left effusion. Heart is borderline in size. Mild vascular congestion. IMPRESSION: Borderline heart size with mild vascular congestion. Bibasilar opacities could reflect atelectasis or infiltrates. Small left effusion. Electronically Signed   By: Rolm Baptise M.D.   On: 06/04/2019 09:29    EKG:   Orders placed or performed during the hospital encounter of 08/08/16  . ED EKG  . ED EKG    ASSESSMENT AND PLAN:   1.  Left hip fracture secondary to mechanical fall.    Status post left hip hemiarthroplasty,  Postop day 1, Pain management as needed   \2.  Hyponatremia- could be due to diuretics, patient received gentle hydration.  Developed acuterespiratory failure today with O2 sats on room air around 83%, so we stopped IV fluids, gave a dose of Lasix, chest x-ray showed mild fluid overload, after the Lasix patient respiratory status improved.   Sodium at 126 previously it was normal in 2019.   3.  Hypertension.    4.  Type II diabetes mellitus.  The patient will be placed on supplemental coverage with NovoLog.  5.   Atrial fibrillation.    Restarted the Coumadin  6.   COPD.  No current exacerbation.  We will continue theophylline for now and place the patient on PRN duo nebs.  7.  Hyperkalemia-ACE inhibitor on hold.  Potassium improved. #8 urinary retention, preoperative urine retention likely secondary to hip fracture, urine retention improved after surgery, received an adult cath multiple times, urine retention improved now, now has Pure vic  DVT prophylaxis.  ;  On Coumadin.       All the records are reviewed and case discussed with Care Management/Social Workerr. Management plans discussed with the patient, Monica [name] -daughter and they are in agreement.  CODE STATUS: fc   TOTAL TIME TAKING CARE OF THIS PATIENT: 36  minutes.   POSSIBLE D/C IN 2-3 DAYS, DEPENDING ON  CLINICAL CONDITION.  Note: This dictation was prepared with Dragon dictation along with smaller phrase technology. Any transcriptional errors that result from this process are unintentional.   Epifanio Lesches M.D on 06/05/2019 at 1:42 PM  Between 7am to 6pm - Pager - [phone] After 6pm go to [email or web address] - password [email or web address] [facility]  Office  [phone]  CC: Primary care physician; [name], NP

## 2019-06-05 NOTE — Progress Notes (Signed)
PT Cancellation Note  Patient Details Name: Monica [name] MRN: [id number] DOB: [birth year]   Cancelled Treatment:    Reason Eval/Treat Not Completed: Other (comment)(PT and OT attempted to see pt this afternoon, reported that she was hurting all over (hips, legs, feet, back). Despite education and encouragement pt refusing rehab this PM. PT will follow up as able.)  Lieutenant Diego PT, DPT 3:14 PM,06/05/19 [phone]

## 2019-06-05 NOTE — Progress Notes (Signed)
PHARMACY NOTE:  ANTIMICROBIAL RENAL DOSAGE ADJUSTMENT  Current antimicrobial regimen includes a mismatch between antimicrobial dosage and estimated renal function.  As per policy approved by the Pharmacy & Therapeutics and Medical Executive Committees, the antimicrobial dosage will be adjusted accordingly.  Current antimicrobial dosage:  Levofloxacin 250 mg daily   Indication: CAP  Renal Function:  Estimated Creatinine Clearance: 39.8 mL/min (by C-G formula based on SCr of 0.89 mg/dL).    Antimicrobial dosage has been changed to:  Levofloxacin 500 mg daily   Additional comments: IV changed to PO: Bioavailability IV to PO is similar. Pt able to tolerate PO meds.    Thank you for allowing pharmacy to be a part of this patient's care.  Oswald Hillock, [facility] 06/05/2019 7:51 AM

## 2019-06-05 NOTE — Progress Notes (Signed)
[place] for warfarin Indication: atrial fibrillation  Allergies  Allergen Reactions  . Sulfa Antibiotics Swelling  . Celecoxib Nausea And Vomiting  . Acetaminophen Itching  . Codeine Rash  . Lyrica [Pregabalin] Rash  . Penicillin G Rash    Has patient had a PCN reaction causing immediate rash, facial/tongue/throat swelling, SOB or lightheadedness with hypotension: Yes Has patient had a PCN reaction causing severe rash involving mucus membranes or skin necrosis: No Has patient had a PCN reaction that required hospitalization: No Has patient had a PCN reaction occurring within the last 10 years: Unknown If all of the above answers are "NO", then may proceed with Cephalosporin use.  Marland Kitchen Petrolatum-Zinc Oxide Rash    Patient Measurements: Height: 5\' 4"  (162.6 cm) Weight: 157 lb (71.2 kg) IBW/kg (Calculated) : 54.7  Vital Signs: Temp: 98 F (36.7 C) (07/21 0731) Temp Source: Oral (07/20 2302) BP: 148/63 (07/21 0731) Pulse Rate: 99 (07/21 0731)  Labs: Recent Labs    06/03/19 0522 06/04/19 0342 06/05/19 0523  HGB  --  10.8*  --   HCT  --  32.5*  --   PLT  --  248  --   LABPROT 17.7* 22.3* 26.7*  INR 1.5* 2.0* 2.5*  CREATININE 0.89  --   --     Estimated Creatinine Clearance: 39.8 mL/min (by C-G formula based on SCr of 0.89 mg/dL).   Medical History: Past Medical History:  Diagnosis Date  . Atrial fibrillation ([place])   . Bronchitis   . Cancer ([place])    Left leg growth, kidneys, lungs and breasts  . Carcinoma of unknown primary ([place])   . COPD (chronic obstructive pulmonary disease) ([place])   . Diabetes mellitus without complication ([place])   . Hyperlipidemia   . Hypertension   . Pancreatitis   . Pneumonia   . Stroke ([place])    TIA's  . Vitamin B12 deficiency     Assessment: 85 year old female presented after a fall resulting in left femoral neck fracture. Patient now s/p left hip hemiarthroplasty. Patient on warfarin PTA for  afib. Home dose 4 mg daily except 2 mg on Tuesdays. Pharmacy consulted to dose warfarin. Pt received vit K 7/16.   Date INR Dose 7/19 1.5         5mg  7/20     2.0         4mg  7/21     2.5         2mg   Goal of Therapy:  INR 2-3 Monitor platelets by anticoagulation protocol: Yes   Plan:  Will resume home dose. Will order Warfarin 2mg  today. INR with morning labs. CBC per protocol.  Eleonore Chiquito, PharmD, BCPS 06/05/2019 7:36 AM

## 2019-06-05 NOTE — Progress Notes (Signed)
CRITICAL VALUE ALERT  Critical Value: Na 124  Provider Notified: MD Vianne Bulls  MD consulting nephrology

## 2019-06-05 NOTE — Progress Notes (Signed)
PT Cancellation Note  Patient Details Name: Monica [name] MRN: [id number] DOB: 08-26-1926   Cancelled Treatment:    Reason Eval/Treat Not Completed: (Pt reported increased pain and fatigue this afternoon, stated she did not want to move her leg/get up since she just got comfortable and had been doing exercises. PT agreeable to attempt later this PM.)   Lieutenant Diego PT, DPT 1:58 PM,06/05/19 [phone]

## 2019-06-05 NOTE — Progress Notes (Signed)
[place] at [place] NAME: Monica [name]    MR#:  [id number]  DATE OF BIRTH:  [birth year]  SUBJECTIVE: Postop day 2 of left hip surgery, not motivated to participate in physical therapy.  No nausea, vomiting.  No fever.  CHIEF COMPLAINT:   REVIEW OF SYSTEMS:  CONSTITUTIONAL: No fever, fatigue or weakness.  EYES: No blurred or double vision.  EARS, NOSE, AND THROAT: No tinnitus or ear pain.  RESPIRATORY: No cough, shortness of breath, wheezing or hemoptysis.  CARDIOVASCULAR: No chest pain, orthopnea, edema.  GASTROINTESTINAL: No nausea, vomiting, diarrhea or abdominal pain.  GENITOURINARY: No dysuria, hematuria.  ENDOCRINE: No polyuria, nocturia,  HEMATOLOGY: No anemia, easy bruising or bleeding SKIN: No rash or lesion. MUSCULOSKELETAL: Reporting left hip pain no joint pain or arthritis.   NEUROLOGIC: No tingling, numbness, weakness.  PSYCHIATRY: No anxiety or depression.   DRUG ALLERGIES:   Allergies  Allergen Reactions  . Sulfa Antibiotics Swelling  . Celecoxib Nausea And Vomiting  . Acetaminophen Itching  . Codeine Rash  . Lyrica [Pregabalin] Rash  . Penicillin G Rash    Has patient had a PCN reaction causing immediate rash, facial/tongue/throat swelling, SOB or lightheadedness with hypotension: Yes Has patient had a PCN reaction causing severe rash involving mucus membranes or skin necrosis: No Has patient had a PCN reaction that required hospitalization: No Has patient had a PCN reaction occurring within the last 10 years: Unknown If all of the above answers are "NO", then may proceed with Cephalosporin use.  Marland Kitchen Petrolatum-Zinc Oxide Rash    VITALS:  Blood pressure 126/70, pulse 99, temperature 98 F (36.7 C), resp. rate 20, height 5\' 4"  (1.626 m), weight 71.2 kg, SpO2 95 %.  PHYSICAL EXAMINATION:  GENERAL:  85 y.o.-year-old patient lying in the bed with no acute distress.  EYES: Pupils equal, round, reactive to light  and accommodation. No scleral icterus. Extraocular muscles intact.  HEENT: Head atraumatic, normocephalic. Oropharynx and nasopharynx clear.  NECK:  Supple, no jugular venous distention. No thyroid enlargement, no tenderness.  LUNGS: Normal breath sounds bilaterally, no wheezing, rales,rhonchi or crepitation. No use of accessory muscles of respiration.  CARDIOVASCULAR: S1, S2 normal. No murmurs, rubs, or gallops.  ABDOMEN: Soft, nontender, nondistended. Bowel sounds present. EXTREMITIES: Left hip is tender and externally rotated no pedal edema, cyanosis, or clubbing.  NEUROLOGIC: Cranial nerves II through XII are intact.  Sensation intact. Gait not checked for safety PSYCHIATRIC: The patient is alert and oriented x 3.  SKIN: No obvious rash, lesion, or ulcer.    LABORATORY PANEL:   CBC Recent Labs  Lab 06/04/19 0342  WBC 10.9*  HGB 10.8*  HCT 32.5*  PLT 248   ------------------------------------------------------------------------------------------------------------------  Chemistries  Recent Labs  Lab 05/31/19 0353  06/03/19 0522  NA 126*   < > 128*  K 4.0   < > 4.5  CL 92*   < > 93*  CO2 24   < > 24  GLUCOSE 170*   < > 169*  BUN 16   < > 29*  CREATININE 0.79   < > 0.89  CALCIUM 9.4   < > 9.0  AST 31  --   --   ALT 21  --   --   ALKPHOS 103  --   --   BILITOT 1.1  --   --    < > = values in this interval not displayed.   ------------------------------------------------------------------------------------------------------------------  Cardiac Enzymes No results  for input(s): TROPONINI in the last 168 hours. ------------------------------------------------------------------------------------------------------------------  RADIOLOGY:  Dg Chest 2 View  Result Date: 06/04/2019 CLINICAL DATA:  Shortness of breath EXAM: CHEST - 2 VIEW COMPARISON:  03/22/2017 FINDINGS: Low lung volumes. Mild elevation of the right hemidiaphragm. Bibasilar opacities, favor atelectasis,  but pneumonia not excluded, particularly in the left base. Suspect small left effusion. Heart is borderline in size. Mild vascular congestion. IMPRESSION: Borderline heart size with mild vascular congestion. Bibasilar opacities could reflect atelectasis or infiltrates. Small left effusion. Electronically Signed   By: Rolm Baptise M.D.   On: 06/04/2019 09:29    EKG:   Orders placed or performed during the hospital encounter of 08/08/16  . ED EKG  . ED EKG    ASSESSMENT AND PLAN:   1.  Left hip fracture secondary to mechanical fall.   Pain management as needed Postop day 2, recovering slowly, continue oxycodone, DVT prophylaxis with Coumadin.  2.  Hyponatremia- could be due to diuretics, patient received gentle hydration.,  Recheck the sodium.    3.  Hypertension.    Controlled.  4.  Type II diabetes mellitus.  The patient will be placed on supplemental coverage with NovoLog.  5.   Atrial fibrillation.   restarted the Coumadin.,  INR 2.5.  6.    Acute respiratory failure with hypoxia secondary to fluid overload, patient received IV fluids for surgery, discontinue IV fluids, received Lasix, hypoxia resolving, patient oxygen saturation are than 95% on 2 L.  7.  Hyperkalemia-resolved.     DVT prophylaxis.  SCDs.    Restarted the Coumadin  Plan to discharge her to rehab  All the records are reviewed and case discussed with Care Management/Social Workerr. Management plans discussed with the patient, susan -daughter and they are in agreement.  CODE STATUS: fc   TOTAL TIME TAKING CARE OF THIS PATIENT: 36  minutes.   POSSIBLE D/C IN 2-3 DAYS, DEPENDING ON CLINICAL CONDITION.  Note: This dictation was prepared with Dragon dictation along with smaller phrase technology. Any transcriptional errors that result from this process are unintentional.   Epifanio Lesches M.D on 06/05/2019 at 1:28 PM  Between 7am to 6pm - Pager - [phone] After 6pm go to [email or web address] -  password [email or web address] [place] [facility]  Office  [phone]  CC: Primary care physician; [name], NP

## 2019-06-05 NOTE — Progress Notes (Signed)
OT Cancellation Note  Patient Details Name: Monica [name] MRN: [id number] DOB: 06-27-1926   Cancelled Treatment:    Reason Eval/Treat Not Completed: Patient declined, no reason specified;Pain limiting ability to participate. On 2nd day of attempting to complete OT evaluation, pt continues to refuse despite encouragement. Pt reported "I'm just in too much pain, I can't possibly participate in therapy today." Will re-attempt next date as appropriate.   Jeni Salles, MPH, MS, OTR/L ascom [phone] 06/05/19, 3:14 PM

## 2019-06-05 NOTE — Progress Notes (Deleted)
[place] at [place] NAME: Monica [name]    MR#:  [id number]  DATE OF BIRTH:  June 21, 1926  SUBJECTIVE: Status post surgery for the left hip.  Patient short of breath, O2 sats 83% on room air, chest x-ray showed pulmonary vascular congestion getting IV Lasix.  CHIEF COMPLAINT:   REVIEW OF SYSTEMS:  CONSTITUTIONAL: No fever, fatigue or weakness.  EYES: No blurred or double vision.  EARS, NOSE, AND THROAT: No tinnitus or ear pain.  RESPIRATORY: No cough, shortness of breath, wheezing or hemoptysis.  CARDIOVASCULAR: No chest pain, orthopnea, edema.  GASTROINTESTINAL: No nausea, vomiting, diarrhea or abdominal pain.  GENITOURINARY: No dysuria, hematuria.  ENDOCRINE: No polyuria, nocturia,  HEMATOLOGY: No anemia, easy bruising or bleeding SKIN: No rash or lesion. MUSCULOSKELETAL: Reporting left hip pain no joint pain or arthritis.   NEUROLOGIC: No tingling, numbness, weakness.  PSYCHIATRY: No anxiety or depression.   DRUG ALLERGIES:   Allergies  Allergen Reactions  . Sulfa Antibiotics Swelling  . Celecoxib Nausea And Vomiting  . Acetaminophen Itching  . Codeine Rash  . Lyrica [Pregabalin] Rash  . Penicillin G Rash    Has patient had a PCN reaction causing immediate rash, facial/tongue/throat swelling, SOB or lightheadedness with hypotension: Yes Has patient had a PCN reaction causing severe rash involving mucus membranes or skin necrosis: No Has patient had a PCN reaction that required hospitalization: No Has patient had a PCN reaction occurring within the last 10 years: Unknown If all of the above answers are "NO", then may proceed with Cephalosporin use.  Marland Kitchen Petrolatum-Zinc Oxide Rash    VITALS:  Blood pressure 126/70, pulse 99, temperature 98 F (36.7 C), resp. rate 20, height 5\' 4"  (1.626 m), weight 71.2 kg, SpO2 95 %.  PHYSICAL EXAMINATION:  GENERAL:  85 y.o.-year-old patient lying in the bed with no acute distress.  EYES:  Pupils equal, round, reactive to light and accommodation. No scleral icterus. Extraocular muscles intact.  HEENT: Head atraumatic, normocephalic. Oropharynx and nasopharynx clear.  NECK:  Supple, no jugular venous distention. No thyroid enlargement, no tenderness.  LUNGS: Normal breath sounds bilaterally, no wheezing, rales,rhonchi or crepitation. No use of accessory muscles of respiration.  CARDIOVASCULAR: S1, S2 normal. No murmurs, rubs, or gallops.  ABDOMEN: Soft, nontender, nondistended. Bowel sounds present. EXTREMITIES: Left hip is tender and externally rotated no pedal edema, cyanosis, or clubbing.  NEUROLOGIC: Cranial nerves II through XII are intact.  Sensation intact. Gait not checked for safety PSYCHIATRIC: The patient is alert and oriented x 3.  SKIN: No obvious rash, lesion, or ulcer.    LABORATORY PANEL:   CBC Recent Labs  Lab 06/04/19 0342  WBC 10.9*  HGB 10.8*  HCT 32.5*  PLT 248   ------------------------------------------------------------------------------------------------------------------  Chemistries  Recent Labs  Lab 05/31/19 0353  06/03/19 0522  NA 126*   < > 128*  K 4.0   < > 4.5  CL 92*   < > 93*  CO2 24   < > 24  GLUCOSE 170*   < > 169*  BUN 16   < > 29*  CREATININE 0.79   < > 0.89  CALCIUM 9.4   < > 9.0  AST 31  --   --   ALT 21  --   --   ALKPHOS 103  --   --   BILITOT 1.1  --   --    < > = values in this interval not displayed.   ------------------------------------------------------------------------------------------------------------------  Cardiac Enzymes No results for input(s): TROPONINI in the last 168 hours. ------------------------------------------------------------------------------------------------------------------  RADIOLOGY:  Dg Chest 2 View  Result Date: 06/04/2019 CLINICAL DATA:  Shortness of breath EXAM: CHEST - 2 VIEW COMPARISON:  03/22/2017 FINDINGS: Low lung volumes. Mild elevation of the right hemidiaphragm.  Bibasilar opacities, favor atelectasis, but pneumonia not excluded, particularly in the left base. Suspect small left effusion. Heart is borderline in size. Mild vascular congestion. IMPRESSION: Borderline heart size with mild vascular congestion. Bibasilar opacities could reflect atelectasis or infiltrates. Small left effusion. Electronically Signed   By: Rolm Baptise M.D.   On: 06/04/2019 09:29    EKG:   Orders placed or performed during the hospital encounter of 08/08/16  . ED EKG  . ED EKG    ASSESSMENT AND PLAN:   1.  Left hip fracture secondary to mechanical fall.    Status post left hip hemiarthroplasty,  Postop day 1, Pain management as needed   \2.  Hyponatremia- could be due to diuretics, patient received gentle hydration.  Developed acuterespiratory failure today with O2 sats on room air around 83%, so we stopped IV fluids, gave a dose of Lasix, chest x-ray showed mild fluid overload, after the Lasix patient respiratory status improved.   Sodium at 126 previously it was normal in 2019.   3.  Hypertension.    4.  Type II diabetes mellitus.  The patient will be placed on supplemental coverage with NovoLog.  5.   Atrial fibrillation.    Restarted the Coumadin  6.   COPD.  No current exacerbation.  We will continue theophylline for now and place the patient on PRN duo nebs.  7.  Hyperkalemia-ACE inhibitor on hold.  Potassium improved. #8 urinary retention, preoperative urine retention likely secondary to hip fracture, urine retention improved after surgery, received an adult cath multiple times, urine retention improved now, now has Pure vic  DVT prophylaxis.  ;  On Coumadin.       All the records are reviewed and case discussed with Care Management/Social Workerr. Management plans discussed with the patient, susan -daughter and they are in agreement.  CODE STATUS: fc   TOTAL TIME TAKING CARE OF THIS PATIENT: 36  minutes.   POSSIBLE D/C IN 2-3 DAYS, DEPENDING ON  CLINICAL CONDITION.  Note: This dictation was prepared with Dragon dictation along with smaller phrase technology. Any transcriptional errors that result from this process are unintentional.   Epifanio Lesches M.D on 06/05/2019 at 1:37 PM  Between 7am to 6pm - Pager - [phone] After 6pm go to [email or web address] - password [email or web address] [facility]  Office  [phone]  CC: Primary care physician; [name], NP

## 2019-06-05 NOTE — Progress Notes (Signed)
   Subjective: 2 Days Post-Op Procedure(s) (LRB): ARTHROPLASTY BIPOLAR HIP (HEMIARTHROPLASTY) (Left) Patient reports pain as mild.   Patient is well, and has had no acute complaints or problems Refused physical therapy yesterday.  Patient states that the pain was worse than having a baby. Plan is to go Rehab after hospital stay. no nausea and no vomiting Patient denies any chest pains or shortness of breath. Patient appears to be resting well.  Did not appear to have any discomfort of full this morning.  Was sleeping upon entering the room.  Objective: Vital signs in last 24 hours: Temp:  [98 F (36.7 C)-98.6 F (37 C)] 98.2 F (36.8 C) (07/20 2302) Pulse Rate:  [84-109] 109 (07/20 2302) Resp:  [17] 17 (07/20 2302) BP: (135-169)/(59-70) 144/63 (07/20 2302) SpO2:  [83 %-98 %] 98 % (07/20 2302) well approximated incision Heels are non tender and elevated off the bed using rolled towels Intake/Output from previous day: 07/20 0701 - 07/21 0700 In: 50 [IV Piggyback:50] Out: 1830 [Urine:1800; Drains:30] Intake/Output this shift: Total I/O In: -  Out: 1130 [Urine:1100; Drains:30]  Recent Labs    06/04/19 0342  HGB 10.8*   Recent Labs    06/04/19 0342  WBC 10.9*  RBC 3.99  HCT 32.5*  PLT 248   Recent Labs    06/03/19 0522  NA 128*  K 4.5  CL 93*  CO2 24  BUN 29*  CREATININE 0.89  GLUCOSE 169*  CALCIUM 9.0   Recent Labs    06/04/19 0342 06/05/19 0523  INR 2.0* 2.5*    EXAM General - Patient is Alert, Appropriate and Oriented Extremity - Neurologically intact Neurovascular intact Sensation intact distally Intact pulses distally Dorsiflexion/Plantar flexion intact No cellulitis present Compartment soft Dressing - moderate drainage Motor Function - intact, moving foot and toes well on exam.    Past Medical History:  Diagnosis Date  . Atrial fibrillation ([place])   . Bronchitis   . Cancer ([place])    Left leg growth, kidneys, lungs and breasts  .  Carcinoma of unknown primary ([place])   . COPD (chronic obstructive pulmonary disease) ([place])   . Diabetes mellitus without complication ([place])   . Hyperlipidemia   . Hypertension   . Pancreatitis   . Pneumonia   . Stroke ([place])    TIA's  . Vitamin B12 deficiency     Assessment/Plan: 2 Days Post-Op Procedure(s) (LRB): ARTHROPLASTY BIPOLAR HIP (HEMIARTHROPLASTY) (Left) Active Problems:   'light-for-dates' infant with signs of fetal malnutrition   Closed left hip fracture (HCC)  Estimated body mass index is 26.95 kg/m as calculated from the following:   Height as of this encounter: 5\' 4"  (1.626 m).   Weight as of this encounter: 71.2 kg. Up with therapy Discharge to SNF when medically cleared  Labs: None DVT Prophylaxis - Lovenox, TED hose and SCD Weight-Bearing as tolerated to left leg Begin working on bowel movement Will need to follow-up in [facility] 2 weeks postop Continue Lovenox 40 mg injections daily for 2 weeks after discharge Hemovac was discontinued today.  Ends of the drain appeared to be intact. Honeycomb dressing was changed.  Monica [name]. Monica [name] 06/05/2019, 6:32 AM

## 2019-06-05 NOTE — Evaluation (Signed)
Physical Therapy Evaluation Patient Details Name: Monica [name] MRN: [id number] DOB: 28-Jun-1926 Today's Date: 06/05/2019   History of Present Illness  Patient s/p L hip hemiarthroplasty due to hip fracture. PMH includes HTN, afib, DM, cancer (L leg, kidneys, lungs, breasts), COPD, HLD, TIAs.     Clinical Impression  Patient alert, oriented to self and situation disoriented to place, behavior WFLs for majority of session but did express some annoyance with physical therapist today. Pt reported 6/10 L hip pain, reported living at ALF at [facility] prior to admission, mod I for ADLs/IADLs, ambulated to dining hall pre covid.  The patient needed physical assistance with all bed level exercises today. Supine to sit with modA, progressed sitting EOB balance from modA to CGA, able to maintain for several minutes to allow bed change. Sit <> stand attempted twice this session, maxA with RW, unable to move feet. ModAx2 allowed pt to successfully transfer her feet underneath her and take one small step to the R. Further mobility deferred due to pt fatigue, elevated HR (120s sitting EOB).  Overall the patient demonstrated deficits (see "PT Problem List") that impede the patient's functional abilities, safety, and mobility and would benefit from skilled PT intervention.      Follow Up Recommendations SNF    Equipment Recommendations  Rolling walker with 5" wheels    Recommendations for Other Services       Precautions / Restrictions Precautions Precautions: Fall Precaution Comments: s/p hemiarthroplasty Restrictions Weight Bearing Restrictions: Yes LLE Weight Bearing: Weight bearing as tolerated      Mobility  Bed Mobility Overal bed mobility: Needs Assistance Bed Mobility: Supine to Sit;Rolling Rolling: Min assist   Supine to sit: HOB elevated;Mod assist     General bed mobility comments: for scooting, LLE management, total trunk elevation  Transfers Overall transfer level: Needs  assistance Equipment used: Rolling walker (2 wheeled) Transfers: Sit to/from Stand Sit to Stand: Max assist;Mod assist;+2 physical assistance         General transfer comment: maxAx1 with RW pt able to achieve standing, modAx2 for increased safety  Ambulation/Gait             General Gait Details: Pt was able to take one side step to the R towards the head of the bed. Further mobility deferred due to safety, pt fatigue, and elevated HR (120s)  Stairs            Wheelchair Mobility    Modified Rankin (Stroke Patients Only)       Balance Overall balance assessment: Needs assistance Sitting-balance support: Feet supported Sitting balance-Leahy Scale: Fair       Standing balance-Leahy Scale: Poor                               Pertinent Vitals/Pain Pain Assessment: 0-10 Pain Score: 6  Pain Location: L hip Pain Descriptors / Indicators: Aching;Grimacing Pain Intervention(s): Limited activity within patient's tolerance;Monitored during session;Repositioned    Home Living Family/patient expects to be discharged to:: Assisted living               Home Equipment: Walker - 4 wheels;Grab bars - toilet;Grab bars - tub/shower;Shower seat      Prior Function Level of Independence: Independent with assistive device(s)         Comments: Prior to covid pt was ambulating to dining hall for meals. Stated she is independent for ADLs     Hand Dominance  Dominant Hand: Right    Extremity/Trunk Assessment   Upper Extremity Assessment Upper Extremity Assessment: Generalized weakness    Lower Extremity Assessment Lower Extremity Assessment: Generalized weakness;LLE deficits/detail LLE Deficits / Details: s/p L hemiarthroplasty    Cervical / Trunk Assessment Cervical / Trunk Assessment: Normal  Communication   Communication: No difficulties  Cognition Arousal/Alertness: Awake/alert Behavior During Therapy: WFL for tasks  assessed/performed Overall Cognitive Status: No family/caregiver present to determine baseline cognitive functioning                                 General Comments: Pt oriented to self, sitaution, disoriented to place (thought she was in a [facility] hospital)      General Comments      Exercises Total Joint Exercises Ankle Circles/Pumps: AROM;10 reps Quad Sets: AROM;Left;10 reps Heel Slides: AAROM;Left;10 reps Straight Leg Raises: AAROM;Left;10 reps Other Exercises Other Exercises: Pt able to sit EOB for several minutes, progressed from modA-CGA   Assessment/Plan    PT Assessment Patient needs continued PT services  PT Problem List Decreased strength;Decreased mobility;Decreased safety awareness;Decreased range of motion;Decreased knowledge of precautions;Decreased activity tolerance;Decreased balance;Decreased knowledge of use of DME;Pain       PT Treatment Interventions DME instruction;Therapeutic exercise;Gait training;Balance training;Neuromuscular re-education;Functional mobility training;Therapeutic activities;Patient/family education    PT Goals (Current goals can be found in the Care Plan section)  Acute Rehab PT Goals Patient Stated Goal: Patient wants to decreased pain PT Goal Formulation: With patient Time For Goal Achievement: 06/19/19 Potential to Achieve Goals: Good    Frequency BID   Barriers to discharge Decreased caregiver support      Co-evaluation               AM-PAC PT "6 Clicks" Mobility  Outcome Measure Help needed turning from your back to your side while in a flat bed without using bedrails?: A Lot Help needed moving from lying on your back to sitting on the side of a flat bed without using bedrails?: A Lot Help needed moving to and from a bed to a chair (including a wheelchair)?: A Lot Help needed standing up from a chair using your arms (e.g., wheelchair or bedside chair)?: A Lot Help needed to walk in hospital room?: A  Lot Help needed climbing 3-5 steps with a railing? : Total 6 Click Score: 11    End of Session Equipment Utilized During Treatment: Gait belt Activity Tolerance: Patient limited by fatigue;Other (comment)(elevated HR) Patient left: in bed;with bed alarm set;with nursing/sitter in room Nurse Communication: Mobility status PT Visit Diagnosis: Unsteadiness on feet (R26.81);Other abnormalities of gait and mobility (R26.89);Muscle weakness (generalized) (M62.81);Pain;Difficulty in walking, not elsewhere classified (R26.2) Pain - Right/Left: Left Pain - part of body: Hip    Time: 0865-7846 PT Time Calculation (min) (ACUTE ONLY): 40 min   Charges:   PT Evaluation $PT Eval Low Complexity: 1 Low PT Treatments $Therapeutic Exercise: 8-22 mins $Therapeutic Activity: 8-22 mins       Lieutenant Diego PT, DPT 10:59 AM,06/05/19 [phone]

## 2019-06-06 DIAGNOSIS — E871 Hypo-osmolality and hyponatremia: Secondary | ICD-10-CM | POA: Diagnosis not present

## 2019-06-06 DIAGNOSIS — I480 Paroxysmal atrial fibrillation: Secondary | ICD-10-CM | POA: Diagnosis not present

## 2019-06-06 DIAGNOSIS — S72002A Fracture of unspecified part of neck of left femur, initial encounter for closed fracture: Secondary | ICD-10-CM | POA: Diagnosis not present

## 2019-06-06 DIAGNOSIS — D649 Anemia, unspecified: Secondary | ICD-10-CM | POA: Diagnosis not present

## 2019-06-06 DIAGNOSIS — I10 Essential (primary) hypertension: Secondary | ICD-10-CM | POA: Diagnosis not present

## 2019-06-06 LAB — BASIC METABOLIC PANEL
Anion gap: 10 (ref 5–15)
BUN: 23 mg/dL (ref 8–23)
CO2: 24 mmol/L (ref 22–32)
Calcium: 8.8 mg/dL — ABNORMAL LOW (ref 8.9–10.3)
Chloride: 89 mmol/L — ABNORMAL LOW (ref 98–111)
Creatinine, Ser: 0.84 mg/dL (ref 0.44–1.00)
GFR calc Af Amer: >60 mL/min (ref >60)
GFR calc non Af Amer: >60 mL/min (ref >60)
Glucose, Bld: 155 mg/dL — ABNORMAL HIGH (ref 70–99)
Potassium: 4.4 mmol/L (ref 3.5–5.1)
Sodium: 123 mmol/L — ABNORMAL LOW (ref 135–145)

## 2019-06-06 LAB — GLUCOSE, CAPILLARY
Glucose-Capillary: 148 mg/dL — ABNORMAL HIGH (ref 70–99)
Glucose-Capillary: 144 mg/dL — ABNORMAL HIGH (ref 70–99)
Glucose-Capillary: 126 mg/dL — ABNORMAL HIGH (ref 70–99)
Glucose-Capillary: 159 mg/dL — ABNORMAL HIGH (ref 70–99)

## 2019-06-06 LAB — PROTIME-INR
INR: 2.9 — ABNORMAL HIGH (ref 0.8–1.2)
Prothrombin Time: 29.8 seconds — ABNORMAL HIGH (ref 11.4–15.2)

## 2019-06-06 LAB — SODIUM: Sodium: 123 mmol/L — ABNORMAL LOW (ref 135–145)

## 2019-06-06 MED ORDER — WARFARIN SODIUM 1 MG PO TABS
1.0000 mg | ORAL_TABLET | Freq: Once | ORAL | Status: AC
  Administered 2019-06-06: 1 mg via ORAL
  Filled 2019-06-06: qty 1

## 2019-06-06 MED ORDER — TOLVAPTAN 15 MG PO TABS
15.0000 mg | ORAL_TABLET | ORAL | Status: DC
  Administered 2019-06-06 – 2019-06-09 (×4): 15 mg via ORAL
  Filled 2019-06-06 (×4): qty 1

## 2019-06-06 MED ORDER — FLEET ENEMA 7-19 GM/118ML RE ENEM: 1.0000 | ENEMA | Freq: Every day | RECTAL | Status: DC

## 2019-06-06 MED ORDER — OXYCODONE HCL 5 MG PO TABS
5.0000 mg | ORAL_TABLET | ORAL | Status: DC | PRN
  Administered 2019-06-06 – 2019-06-09 (×9): 5 mg via ORAL
  Filled 2019-06-06 (×9): qty 1

## 2019-06-06 MED ORDER — OXYCODONE HCL 5 MG PO TABS: 10.0000 mg | ORAL_TABLET | Freq: Four times a day (QID) | ORAL | Status: DC | PRN

## 2019-06-06 MED ORDER — FLEET ENEMA 7-19 GM/118ML RE ENEM: 1.0000 | ENEMA | Freq: Every day | RECTAL | Status: DC | PRN

## 2019-06-06 NOTE — Progress Notes (Signed)
Patient ID: Monica [name], female   DOB: July 24, 1926, 85 y.o.   MRN: [id number]  [employer] PROGRESS NOTE  TASMINE HIPWELL BZJ:[id number] DOB: 1926/10/17 DOA: 05/31/2019 PCP: [name], NP  HPI/Subjective: Patient complains of 10 out of 10 pain in certain places and 7 out of 10 pain in other places.  She states she had a bowel movement yesterday evening.  Feels okay.  She is asking to go home prior to going to rehab.  Objective: Vitals:   06/06/19 0829 06/06/19 1050  BP:    Pulse:  90  Resp:    Temp:    SpO2: 92% 96%    Filed Weights   05/31/19 0340  Weight: 71.2 kg    ROS: Review of Systems  Constitutional: Negative for chills and fever.  Eyes: Negative for blurred vision.  Respiratory: Negative for cough and shortness of breath.   Cardiovascular: Negative for chest pain.  Gastrointestinal: Negative for abdominal pain, constipation, diarrhea, nausea and vomiting.  Genitourinary: Negative for dysuria.  Musculoskeletal: Positive for joint pain.  Neurological: Negative for dizziness and headaches.   Exam: Physical Exam  Constitutional: She is oriented to person, place, and time.  HENT:  Nose: No mucosal edema.  Mouth/Throat: No oropharyngeal exudate or posterior oropharyngeal edema.  Eyes: Pupils are equal, round, and reactive to light. Conjunctivae, EOM and lids are normal.  Neck: No JVD present. Carotid bruit is not present. No edema present. No thyroid mass and no thyromegaly present.  Cardiovascular: S1 normal and S2 normal. Exam reveals no gallop.  No murmur heard. Pulses:      Dorsalis pedis pulses are 2+ on the right side and 2+ on the left side.  Respiratory: No respiratory distress. She has no wheezes. She has no rhonchi. She has no rales.  GI: Soft. Bowel sounds are normal. There is no abdominal tenderness.  Musculoskeletal:     Right ankle: She exhibits swelling.     Left ankle: She exhibits swelling.  Lymphadenopathy:    She has no cervical  adenopathy.  Neurological: She is alert and oriented to person, place, and time. No cranial nerve deficit.  Skin: Skin is warm. No rash noted. Nails show no clubbing.  Psychiatric: She has a normal mood and affect.      Data Reviewed: Basic Metabolic Panel: Recent Labs  Lab 06/01/19 0449 06/02/19 0419 06/03/19 0522 06/05/19 1347 06/06/19 1111  NA 131* 126* 128* 124* 123*  K 5.3* 4.6 4.5 4.4 4.4  CL 96* 93* 93* 91* 89*  CO2 26 25 24 25 24   GLUCOSE 147* 161* 169* 190* 155*  BUN 17 23 29* 23 23  CREATININE 1.07* 0.83 0.89 0.72 0.84  CALCIUM 9.2 9.0 9.0 8.6* 8.8*   Liver Function Tests: Recent Labs  Lab 05/31/19 0353  AST 31  ALT 21  ALKPHOS 103  BILITOT 1.1  PROT 6.8  ALBUMIN 3.9   CBC: Recent Labs  Lab 05/31/19 0353 06/01/19 0449 06/04/19 0342  WBC 18.1* 13.6* 10.9*  HGB 12.5 13.0 10.8*  HCT 37.3 41.1 32.5*  MCV 81.1 85.4 81.5  PLT 287 254 248    CBG: Recent Labs  Lab 06/05/19 1143 06/05/19 1631 06/05/19 2058 06/06/19 0800 06/06/19 1129  GLUCAP 154* 163* 148* 148* 144*    Recent Results (from the past 240 hour(s))  SARS Coronavirus 2 (CEPHEID - Performed in [place] hospital lab), Hosp Order     Status: None   Collection Time: 05/31/19  6:35 AM  Specimen: Nasopharyngeal Swab  Result Value Ref Range Status   SARS Coronavirus 2 NEGATIVE NEGATIVE Final    Comment: (NOTE) If result is NEGATIVE SARS-CoV-2 target nucleic acids are NOT DETECTED. The SARS-CoV-2 RNA is generally detectable in upper and lower  respiratory specimens during the acute phase of infection. The lowest  concentration of SARS-CoV-2 viral copies this assay can detect is 250  copies / mL. A negative result does not preclude SARS-CoV-2 infection  and should not be used as the sole basis for treatment or other  patient management decisions.  A negative result may occur with  improper specimen collection / handling, submission of specimen other  than nasopharyngeal swab,  presence of viral mutation(s) within the  areas targeted by this assay, and inadequate number of viral copies  (<250 copies / mL). A negative result must be combined with clinical  observations, patient history, and epidemiological information. If result is POSITIVE SARS-CoV-2 target nucleic acids are DETECTED. The SARS-CoV-2 RNA is generally detectable in upper and lower  respiratory specimens dur ing the acute phase of infection.  Positive  results are indicative of active infection with SARS-CoV-2.  Clinical  correlation with patient history and other diagnostic information is  necessary to determine patient infection status.  Positive results do  not rule out bacterial infection or co-infection with other viruses. If result is PRESUMPTIVE POSTIVE SARS-CoV-2 nucleic acids MAY BE PRESENT.   A presumptive positive result was obtained on the submitted specimen  and confirmed on repeat testing.  While 2019 novel coronavirus  (SARS-CoV-2) nucleic acids may be present in the submitted sample  additional confirmatory testing may be necessary for epidemiological  and / or clinical management purposes  to differentiate between  SARS-CoV-2 and other Sarbecovirus currently known to infect humans.  If clinically indicated additional testing with an alternate test  methodology [phone]) is advised. The SARS-CoV-2 RNA is generally  detectable in upper and lower respiratory sp ecimens during the acute  phase of infection. The expected result is Negative. Fact Sheet for Patients:  StrictlyIdeas.no Fact Sheet for Healthcare Providers: BankingDealers.co.za This test is not yet approved or cleared by the [place] FDA and has been authorized for detection and/or diagnosis of SARS-CoV-2 by FDA under an Emergency Use Authorization (EUA).  This EUA will remain in effect (meaning this test can be used) for the duration of the COVID-19 declaration under  Section 564(b)(1) of the Act, 21 U.S.C. section 360bbb-3(b)(1), unless the authorization is terminated or revoked sooner. Performed at [facility], [address] [place], [place] [address]      Scheduled Meds: . amLODipine  10 mg Oral Daily  . B-complex with vitamin C  1 tablet Oral Daily  . famotidine  20 mg Oral Daily  . ferrous sulfate  325 mg Oral BID WC  . gabapentin  600 mg Oral QHS  . insulin aspart  0-9 Units Subcutaneous TID WC  . levofloxacin  500 mg Oral Daily  . loratadine  10 mg Oral Daily  . magnesium oxide  400 mg Oral Daily  . montelukast  10 mg Oral QHS  . nystatin cream   Topical BID  . oxybutynin  10 mg Oral Daily  . pravastatin  40 mg Oral Daily  . senna-docusate  1 tablet Oral BID  . theophylline  100 mg Oral QODAY  . tolvaptan  15 mg Oral Q24H  . warfarin  1 mg Oral ONCE-1800  . Warfarin - Pharmacist Dosing Inpatient  Does not apply q1800   Continuous Infusions:  Assessment/Plan:  1.  Hyponatremia.  Patient does take hydrochlorothiazide at home likely the culprit.  IV fluids were sent in the patient's sodium level.  Sodium down to 124 today.  Case discussed with nephrology and dose of tolvaptan given today.  Serial sodiums. 2.  Left hip fracture requiring operative repair.  Potentially out to rehab tomorrow or Friday based on patient's sodium level. 3.  Essential hypertension on Norvasc 4.  Atrial fibrillation back on warfarin.  Pharmacy dosing. 5.  Acute hypoxic respiratory failure.  Pulse ox on room air 88% now back on oxygen.  Hopefully the tolvaptan will help get rid of fluid and we can get her off the oxygen. 6.  Atelectasis versus infiltrates on chest x-ray on the 20th and patient placed on Levaquin. 7.  Type 2 diabetes mellitus on sliding scale insulin. 8.  Diabetic neuropathy on gabapentin  Code Status:     Code Status Orders  (From admission, onward)         Start     Ordered   05/31/19 0506  Full code  Continuous      05/31/19 0514        Code Status History    Date Active Date Inactive Code Status Order ID Comments User Context   01/23/2018 2150 01/26/2018 1345 Full Code [id number]  Dustin Flock, MD Inpatient   08/08/2016 1200 08/11/2016 1241 Full Code [id number]  Baxter Hire, MD Inpatient   Advance Care Planning Activity     Family Communication: Spoke with daughter [name] on the phone patient request. Disposition Plan: Hopefully out to rehab tomorrow if sodium is better versus Friday  Consultants:  Orthopedic surgery  Nephrology  Procedures:  Left hip repair  Antibiotics:  Levaquin  Time spent: 28 minutes  [place]

## 2019-06-06 NOTE — Care Management Important Message (Signed)
Important Message  Patient Details  Name: Monica [name] MRN: [id number] Date of Birth: 1926-07-07   Medicare Important Message Given:  Yes     Juliann Pulse A Daegan Arizmendi 06/06/2019, 10:51 AM

## 2019-06-06 NOTE — Progress Notes (Signed)
Physical Therapy Treatment Patient Details Name: Monica [name] MRN: [id number] DOB: [birth year] Today's Date: 06/06/2019    History of Present Illness Patient s/p L hip hemiarthroplasty due to hip fracture. PMH includes HTN, afib, DM, cancer (L leg, kidneys, lungs, breasts), COPD, HLD, TIAs.    PT Comments    Pt initially agreeable to PT, noting she was uncomfortable and hurting in L hip and B ankle wishing legs lowered. Pt noted to be positioned in chair with ankle rolls versus pillow under calves. Assisted pt with seated position and began to educate in exercises. Pt quickly becomes agitated with exercise performance stating she has already done exercises. Education/explanation for benefit of exercises multiple times during day; pt does not agree. Offered stand and pt becomes increasingly agitated telling therapist to get walker out of her room. Upon repositioning pt back in long sit position in chair with pillow under calves for comfort, pt states, "you are aggravating the heck out of me." No further treatment attempted at this time. Pt comfortable in chair with alarm set and all needs in reach. Re attempt PT at a later time to progress participation, strength, endurance, balance and all functional mobility.   Follow Up Recommendations  SNF     Equipment Recommendations  Rolling walker with 5" wheels    Recommendations for Other Services       Precautions / Restrictions Precautions Precautions: Fall;Posterior Hip Precaution Comments: s/p hemiarthroplasty Restrictions Weight Bearing Restrictions: Yes LLE Weight Bearing: Weight bearing as tolerated    Mobility  Bed Mobility            General bed mobility comments: Not tested; up in chair  Transfers            General transfer comment: Refuses; also refuses use of rw (becomes more agitated with discussion/education of why rw in room as pt waves hand at it and tells therapis "get that thing out of sight; get it out of  my room"  Ambulation/Gait                 Stairs             Wheelchair Mobility    Modified Rankin (Stroke Patients Only)       Balance Overall balance assessment: Needs assistance Sitting-balance support: Feet supported Sitting balance-Leahy Scale: Fair                                   Cognition Arousal/Alertness: Awake/alert Behavior During Therapy: Agitated Overall Cognitive Status: Within Functional Limits for tasks assessed                                       Exercises Total Joint Exercises Ankle Circles/Pumps: AROM;Both;10 reps;Seated Long Arc Quad: AROM;Left;10 reps Marching in Standing: AROM;Strengthening;Both;10 reps;Seated;Other (comment)(limited range) Other Exercises Other Exercises: Pt refuses any/all other therapy or education, and states "I have already done exercises" Becomes more agitated with explanation of benefit of moving/gentle exercises several times per day Other Exercises: OT facilitates education re: THPs and use of AE for ADL self-care modifications necessary to maintain THPs safely. Pt verbalized understanding, needs reinforcement. Other Exercises: OT facilitates education re: appropriate d/c recommendations SNF vs home to ALF with HHOT/HHPT-SNF is safest most prudent option for pt safety and to maximize independence potential. Pt verbalized understanding.  OT exercises  under Other, not performed in this session; this therapist unable to remove this from current note, as it automatically transferred over.     General Comments        Pertinent Vitals/Pain Pain Assessment: 0-10 Pain Score: 6  Faces Pain Scale: Hurts whole lot Pain Location: L hip Pain Descriptors / Indicators: Grimacing;Constant(pt on B ankle rolls in chair with complaints at rolls + hip ) Pain Intervention(s): Limited activity within patient's tolerance;Monitored during session    [place]: Walker - 4 wheels;Grab bars - toilet;Grab bars - tub/shower;Shower seat      Prior Function         PT Goals (current goals can now be found in the care plan section)     Frequency    BID      PT Plan Current plan remains appropriate    Co-evaluation              AM-PAC PT "6 Clicks" Mobility   Outcome Measure  Help needed turning from your back to your side while in a flat bed without using bedrails?: A Lot Help needed moving from lying on your back to sitting on the side of a flat bed without using bedrails?: A Lot Help needed moving to and from a bed to a chair (including a wheelchair)?: A Lot Help needed standing up from a chair using your arms (e.g., wheelchair or bedside chair)?: A Lot Help needed to walk in hospital room?: A Lot Help needed climbing 3-5 steps with a railing? : Total 6 Click Score: 11    End of Session Equipment Utilized During Treatment: Oxygen Activity Tolerance: Treatment limited secondary to agitation Patient left: in chair;with call bell/phone within reach;with chair alarm set;Other (comment)(positioned with pillow under calves versus ankle rolls)   PT Visit Diagnosis: Unsteadiness on feet (R26.81);Other abnormalities of gait and mobility (R26.89);Muscle weakness (generalized) (M62.81);Pain;Difficulty in walking, not elsewhere classified (R26.2) Pain - Right/Left: Left Pain - part of body: Hip     Time: 1050-1102 PT Time Calculation (min) (ACUTE ONLY): 12 min  Charges:  $Therapeutic Exercise: 8-22 mins                      Larae Grooms, PTA 06/06/2019, 11:28 AM

## 2019-06-06 NOTE — Progress Notes (Signed)
PT Cancellation Note  Patient Details Name: Monica [name] MRN: [id number] DOB: [birth year]   Cancelled Treatment:    Reason Eval/Treat Not Completed: Patient declined, no reason specified. Treatment attempted this afternoon and pt adamantly declines therapy. Re attempt at a later date.    Larae Grooms, PTA 06/06/2019, 2:25 PM

## 2019-06-06 NOTE — Progress Notes (Signed)
   Subjective: 3 Days Post-Op Procedure(s) (LRB): ARTHROPLASTY BIPOLAR HIP (HEMIARTHROPLASTY) (Left) Patient reports pain as severe.  However patient does not appear to be in discomfort.  Patient was sleeping upon entering the room.  Patient states that pain was severe enough last night she thought about committing suicide.  Also talked about calling her family to come and get her to take her home.  Patient states that she is not happy at all here. Patient is well, and has had no acute complaints or problems Progressing very slowly with therapy. Plan is to go Rehab after hospital stay. no nausea and no vomiting Patient denies any chest pains or shortness of breath.  Objective: Vital signs in last 24 hours: Temp:  [97.8 F (36.6 C)-98.3 F (36.8 C)] 97.8 F (36.6 C) (07/21 2329) Pulse Rate:  [99-100] 100 (07/21 2329) Resp:  [18-20] 18 (07/21 2329) BP: (126-154)/(61-70) 154/61 (07/21 2329) SpO2:  [95 %-96 %] 95 % (07/21 2329) well approximated incision Heels are non tender and elevated off the bed using rolled towels Intake/Output from previous day: 07/21 0701 - 07/22 0700 In: 360 [P.O.:360] Out: 600 [Urine:600] Intake/Output this shift: Total I/O In: -  Out: 300 [Urine:300]  Recent Labs    06/04/19 0342  HGB 10.8*   Recent Labs    06/04/19 0342  WBC 10.9*  RBC 3.99  HCT 32.5*  PLT 248   Recent Labs    06/05/19 1347  NA 124*  K 4.4  CL 91*  CO2 25  BUN 23  CREATININE 0.72  GLUCOSE 190*  CALCIUM 8.6*   Recent Labs    06/05/19 0523 06/06/19 0251  INR 2.5* 2.9*    EXAM General - Patient is Alert and Confused Extremity - Neurologically intact Neurovascular intact Sensation intact distally Intact pulses distally Dorsiflexion/Plantar flexion intact No cellulitis present Compartment soft Dressing - Saturated with serosanguineous drainage secondary to being on Coumadin. Motor Function - intact, moving foot and toes well on exam.    Past Medical  History:  Diagnosis Date  . Atrial fibrillation ([place])   . Bronchitis   . Cancer ([place])    Left leg growth, kidneys, lungs and breasts  . Carcinoma of unknown primary ([place])   . COPD (chronic obstructive pulmonary disease) ([place])   . Diabetes mellitus without complication ([place])   . Hyperlipidemia   . Hypertension   . Pancreatitis   . Pneumonia   . Stroke ([place])    TIA's  . Vitamin B12 deficiency     Assessment/Plan: 3 Days Post-Op Procedure(s) (LRB): ARTHROPLASTY BIPOLAR HIP (HEMIARTHROPLASTY) (Left) Active Problems:   'light-for-dates' infant with signs of fetal malnutrition   Closed left hip fracture (HCC)   Pressure injury of skin  Estimated body mass index is 26.95 kg/m as calculated from the following:   Height as of this encounter: 5\' 4"  (1.626 m).   Weight as of this encounter: 71.2 kg. Up with therapy Discharge to SNF when medically stable  Labs: INR today 2.9 DVT Prophylaxis - Coumadin, Foot Pumps and TED hose Weight-Bearing as tolerated to left leg Please change dressing today and apply a new honeycomb dressing Patient will need to follow-up in clinical clinic in 2 weeks Continue Coumadin Patient needs bowel movement  Jon R. Modesto Shelby 06/06/2019, 6:23 AM

## 2019-06-06 NOTE — Consult Note (Signed)
[facility] CONSULT NOTE    Date: 06/06/2019                  Patient Name:  Monica [name]  MRN: [id number]  DOB: 10-17-1926  Age / Sex: 85 y.o., female         PCP: [name], NP                 Service Requesting Consult: Hospitalist                 Reason for Consult: Hyponatremia            History of Present Illness: Patient is a 85 y.o. female with a PMHx of atrial fibrillation, bronchitis, COPD, diabetes mellitus type 2, hypertension, hyperlipidemia, pancreatitis, pneumonia, prior history of CVA, vitamin D B12 deficiency, who was admitted to [facility] on 05/31/2019 for evaluation of left hip fracture.  Patient developed left hip after she fell onto her left side.  She is status post left hip hemiarthroplasty performed on June 03, 2019.  We are now asked to evaluate her for hyponatremia.  Initial serum sodium was 126.  This did rise to 131.  However now serum sodium down to 124.  She did receive IV fluid hydration earlier as she was on diuretic therapy.  Despite IV fluid hydration however serum sodium has come down suggesting underlying SIADH.  She currently denies nausea, vomiting, or diarrhea.   Medications: Outpatient medications: Medications Prior to Admission  Medication Sig Dispense Refill Last Dose  . amLODipine-benazepril (LOTREL) 5-20 MG capsule TAKE 1 CAPSULE BY MOUTH EVERY DAY 90 capsule 3 05/30/2019 at 0800  . b complex vitamins capsule Take 1 capsule by mouth daily.   05/30/2019 at 0800  . desloratadine (CLARINEX) 5 MG tablet Take 1 tablet (5 mg total) by mouth daily. 30 tablet 3 05/30/2019 at 0800  . famotidine (PEPCID) 20 MG tablet Take 1 tablet (20 mg total) by mouth 2 (two) times daily. 30 tablet 0 05/30/2019 at 2000  . fluticasone furoate-vilanterol (BREO ELLIPTA) 100-25 MCG/INH AEPB TAKE 1 PUFF ONCE A DAY 60 each 3 05/30/2019 at 0800  . furosemide (LASIX) 20 MG tablet Take 20 mg by mouth daily.    05/30/2019 at 0800  . gabapentin (NEURONTIN)  300 MG capsule Take 2 capsules (600 mg total) by mouth at bedtime. 60 capsule 5 05/30/2019 at 2000  . glipiZIDE (GLUCOTROL XL) 2.5 MG 24 hr tablet Take 1 tablet (2.5 mg total) by mouth daily. 90 tablet 1 05/30/2019 at 0800  . hydrochlorothiazide (HYDRODIURIL) 25 MG tablet Take 1 tablet (25 mg total) by mouth daily. 30 tablet 5 05/30/2019 at 0800  . Magnesium 200 MG TABS Take 1 tablet (200 mg total) by mouth daily. 30 tablet 5 05/30/2019 at 0800  . metFORMIN (GLUCOPHAGE-XR) 500 MG 24 hr tablet Take 2 tablets (1,000 mg total) by mouth daily. 135 tablet 2 05/30/2019 at 2000  . montelukast (SINGULAIR) 10 MG tablet Take 10 mg by mouth at bedtime.   05/30/2019 at 2000  . oxybutynin (DITROPAN-XL) 10 MG 24 hr tablet Take 1 tablet (10 mg total) by mouth daily. 30 tablet 5 05/30/2019 at 0800  . pravastatin (PRAVACHOL) 40 MG tablet Take 1 tablet (40 mg total) by mouth daily. 90 tablet 1 05/30/2019 at 2000  . theophylline (THEO-24) 100 MG 24 hr capsule TAKE 1 CAPSULE BY MOUTH EVERY DAY 30 capsule 3 05/30/2019 at 0800  . triamcinolone (KENALOG) 0.025 % cream Apply  1 application topically 2 (two) times daily. Ok to mix with topical lotion and apply to all affected areas. 80 g 3 05/30/2019 at 2000  . Umeclidinium-Vilanterol (ANORO ELLIPTA) 62.5-25 MCG/INH AEPB Inhale 1 puff into the lungs daily.    05/30/2019 at 2000  . warfarin (COUMADIN) 4 MG tablet TAKE ONE TABLET EVERY DAY EXCEPT MONDAY TAKE 1/2 TABLET 30 tablet 5 05/30/2019 at 1800  . albuterol (VENTOLIN HFA) 108 (90 Base) MCG/ACT inhaler Inhale 2 puffs into the lungs every 4 (four) hours as needed for wheezing or shortness of breath. 1 Inhaler 5 prn at prn  . Liniments (SALONPAS PAIN RELIEF PATCH EX) Apply topically.   prn at prn  . ONE TOUCH ULTRA TEST test strip TEST ONCE A DAY AS DIRECTED 100 each 1     Current medications: Current Facility-Administered Medications  Medication Dose Route Frequency Provider Last Rate Last Dose  . acetaminophen (TYLENOL) tablet  325-650 mg  325-650 mg Oral Q6H PRN Hooten, Laurice Record, MD      . albuterol (PROVENTIL) (2.5 MG/3ML) 0.083% nebulizer solution 2.5 mg  2.5 mg Inhalation Q4H PRN Hooten, Laurice Record, MD      . amLODipine (NORVASC) tablet 10 mg  10 mg Oral Daily Hooten, Laurice Record, MD   10 mg at 06/06/19 0834  . B-complex with vitamin C tablet 1 tablet  1 tablet Oral Daily Hooten, Laurice Record, MD   1 tablet at 06/05/19 0946  . bisacodyl (DULCOLAX) suppository 10 mg  10 mg Rectal Daily PRN Dereck Leep, MD   10 mg at 06/05/19 1743  . famotidine (PEPCID) tablet 20 mg  20 mg Oral Daily Hooten, Laurice Record, MD   20 mg at 06/06/19 8588  . ferrous sulfate tablet 325 mg  325 mg Oral BID WC Dereck Leep, MD   325 mg at 06/06/19 5027  . gabapentin (NEURONTIN) capsule 600 mg  600 mg Oral QHS Hooten, Laurice Record, MD   600 mg at 06/05/19 2201  . guaiFENesin (ROBITUSSIN) 100 MG/5ML solution 200 mg  10 mL Oral Q6H PRN Epifanio Lesches, MD   200 mg at 06/05/19 1633  . HYDROmorphone (DILAUDID) injection 0.5-1 mg  0.5-1 mg Intravenous Q4H PRN Hooten, Laurice Record, MD      . insulin aspart (novoLOG) injection 0-9 Units  0-9 Units Subcutaneous TID WC Epifanio Lesches, MD   1 Units at 06/06/19 [phone]  . levofloxacin (LEVAQUIN) tablet 500 mg  500 mg Oral Daily Epifanio Lesches, MD   500 mg at 06/06/19 0835  . loratadine (CLARITIN) tablet 10 mg  10 mg Oral Daily Hooten, Laurice Record, MD   10 mg at 06/06/19 0834  . magnesium hydroxide (MILK OF MAGNESIA) suspension 30 mL  30 mL Oral Daily PRN Hooten, Laurice Record, MD   30 mL at 06/03/19 1736  . magnesium oxide (MAG-OX) tablet 400 mg  400 mg Oral Daily Hooten, Laurice Record, MD   400 mg at 06/06/19 0835  . menthol-cetylpyridinium (CEPACOL) lozenge 3 mg  1 lozenge Oral PRN Hooten, Laurice Record, MD       Or  . phenol (CHLORASEPTIC) mouth spray 1 spray  1 spray Mouth/Throat PRN Hooten, Laurice Record, MD      . metoCLOPramide (REGLAN) tablet 5-10 mg  5-10 mg Oral Q8H PRN Hooten, Laurice Record, MD       Or  . metoCLOPramide (REGLAN)  injection 5-10 mg  5-10 mg Intravenous Q8H PRN Hooten, Laurice Record, MD      .  montelukast (SINGULAIR) tablet 10 mg  10 mg Oral QHS Hooten, Laurice Record, MD   10 mg at 06/05/19 2201  . nystatin cream (MYCOSTATIN)   Topical BID Hooten, Laurice Record, MD      . ondansetron (ZOFRAN) tablet 4 mg  4 mg Oral Q6H PRN Hooten, Laurice Record, MD       Or  . ondansetron (ZOFRAN) injection 4 mg  4 mg Intravenous Q6H PRN Hooten, Laurice Record, MD      . oxybutynin (DITROPAN-XL) 24 hr tablet 10 mg  10 mg Oral Daily Hooten, Laurice Record, MD   10 mg at 06/06/19 0834  . oxyCODONE (Oxy IR/ROXICODONE) immediate release tablet 10 mg  10 mg Oral Q4H PRN Hooten, Laurice Record, MD      . pravastatin (PRAVACHOL) tablet 40 mg  40 mg Oral Daily Hooten, Laurice Record, MD   40 mg at 06/06/19 0835  . senna-docusate (Senokot-S) tablet 1 tablet  1 tablet Oral BID Dereck Leep, MD   1 tablet at 06/06/19 [phone]  . sodium phosphate (FLEET) 7-19 GM/118ML enema 1 enema  1 enema Rectal Daily Wieting, Richard, MD      . theophylline (THEO-24) 24 hr capsule 100 mg  100 mg Oral QODAY Hooten, Laurice Record, MD   100 mg at 06/04/19 0957  . traMADol (ULTRAM) tablet 50 mg  50 mg Oral Q6H PRN Dereck Leep, MD   50 mg at 06/06/19 0836  . traZODone (DESYREL) tablet 25 mg  25 mg Oral QHS PRN Hooten, Laurice Record, MD      . Warfarin - Pharmacist Dosing Inpatient   Does not apply q1800 Tawnya Crook, RPH          Allergies: Allergies  Allergen Reactions  . Sulfa Antibiotics Swelling  . Celecoxib Nausea And Vomiting  . Acetaminophen Itching  . Codeine Rash  . Lyrica [Pregabalin] Rash  . Penicillin G Rash    Has patient had a PCN reaction causing immediate rash, facial/tongue/throat swelling, SOB or lightheadedness with hypotension: Yes Has patient had a PCN reaction causing severe rash involving mucus membranes or skin necrosis: No Has patient had a PCN reaction that required hospitalization: No Has patient had a PCN reaction occurring within the last 10 years: Unknown If all of the  above answers are "NO", then may proceed with Cephalosporin use.  Marland Kitchen Petrolatum-Zinc Oxide Rash      Past Medical History: Past Medical History:  Diagnosis Date  . Atrial fibrillation ([place])   . Bronchitis   . Cancer ([place])    Left leg growth, kidneys, lungs and breasts  . Carcinoma of unknown primary ([place])   . COPD (chronic obstructive pulmonary disease) ([place])   . Diabetes mellitus without complication ([place])   . Hyperlipidemia   . Hypertension   . Pancreatitis   . Pneumonia   . Stroke ([place])    TIA's  . Vitamin B12 deficiency      Past Surgical History: Past Surgical History:  Procedure Laterality Date  . ABDOMINAL HYSTERECTOMY  1975  . BLADDER SUSPENSION  1989  . COLONOSCOPY    . COLONOSCOPY WITH PROPOFOL N/A 01/25/2018   Procedure: COLONOSCOPY WITH PROPOFOL;  Surgeon: [name], MD;  Location: [facility] ENDOSCOPY;  Service: Endoscopy;  Laterality: N/A;  . ESOPHAGOGASTRODUODENOSCOPY (EGD) WITH PROPOFOL N/A 01/24/2018   Procedure: ESOPHAGOGASTRODUODENOSCOPY (EGD) WITH PROPOFOL;  Surgeon: [name], MD;  Location: [facility] ENDOSCOPY;  Service: Endoscopy;  Laterality: N/A;  . HIP ARTHROPLASTY Left 06/03/2019   Procedure:  ARTHROPLASTY BIPOLAR HIP (HEMIARTHROPLASTY);  Surgeon: Dereck Leep, MD;  Location: [facility] ORS;  Service: Orthopedics;  Laterality: Left;     Family History: Family History  Problem Relation Age of Onset  . Cancer Father   . Diabetes Brother      Social History: Social History   Socioeconomic History  . Marital status: Divorced    Spouse name: Not on file  . Number of children: Not on file  . Years of education: Not on file  . Highest education level: Not on file  Occupational History  . Not on file  Social Needs  . Financial resource strain: Not on file  . Food insecurity    Worry: Not on file    Inability: Not on file  . Transportation needs    Medical: Not on file    Non-medical: Not on file  Tobacco Use  . Smoking status: Former Smoker     Packs/day: 0.50    Years: 15.00    Pack years: 7.50    Types: Cigarettes    Quit date: 11/15/1988    Years since quitting: 30.5  . Smokeless tobacco: Never Used  Substance and Sexual Activity  . Alcohol use: No    Alcohol/week: 0.0 standard drinks  . Drug use: No  . Sexual activity: Not on file    Comment: hysterectomy   Lifestyle  . Physical activity    Days per week: Not on file    Minutes per session: Not on file  . Stress: Not on file  Relationships  . Social Herbalist on phone: Not on file    Gets together: Not on file    Attends religious service: Not on file    Active member of club or organization: Not on file    Attends meetings of clubs or organizations: Not on file    Relationship status: Not on file  . Intimate partner violence    Fear of current or ex partner: Not on file    Emotionally abused: Not on file    Physically abused: Not on file    Forced sexual activity: Not on file  Other Topics Concern  . Not on file  Social History Narrative  . Not on file     Review of Systems: Review of Systems  Constitutional: Positive for malaise/fatigue. Negative for chills and fever.  HENT: Positive for hearing loss. Negative for congestion and sore throat.   Eyes: Negative for blurred vision and double vision.  Respiratory: Positive for shortness of breath. Negative for cough and hemoptysis.   Cardiovascular: Negative for chest pain, palpitations and orthopnea.  Gastrointestinal: Negative for diarrhea, heartburn, nausea and vomiting.  Genitourinary: Negative for dysuria and urgency.  Musculoskeletal: Positive for joint pain. Negative for myalgias.  Skin: Negative for itching and rash.  Neurological: Positive for weakness. Negative for speech change.  Endo/Heme/Allergies: Negative for polydipsia. Does not bruise/bleed easily.  Psychiatric/Behavioral: Negative for depression. The patient is not nervous/anxious.      Vital Signs: Blood pressure (!)  141/60, pulse 89, temperature 98.2 F (36.8 C), temperature source Oral, resp. rate 18, height 5\' 4"  (1.626 m), weight 71.2 kg, SpO2 92 %.  Weight trends: Filed Weights   05/31/19 0340  Weight: 71.2 kg    Physical Exam: General: NAD, sitting up in chair  Head: Normocephalic, atraumatic.  Eyes: Anicteric, EOMI  Nose: Mucous membranes moist, not inflammed, nonerythematous.  Throat: Oropharynx nonerythematous, no exudate appreciated.   Neck: Supple, trachea midline.  Lungs:  Normal respiratory effort. Clear to auscultation BL without crackles or wheezes.  Heart: S1S2 irregular  Abdomen:  BS normoactive. Soft, Nondistended, non-tender.  No masses or organomegaly.  Extremities: No pretibial edema.  Neurologic: A&O X3, Motor strength is 5/5 in the all 4 extremities  Skin: No visible rashes, scars.    Lab results: Basic Metabolic Panel: Recent Labs  Lab 06/02/19 0419 06/03/19 0522 06/05/19 1347  NA 126* 128* 124*  K 4.6 4.5 4.4  CL 93* 93* 91*  CO2 25 24 25   GLUCOSE 161* 169* 190*  BUN 23 29* 23  CREATININE 0.83 0.89 0.72  CALCIUM 9.0 9.0 8.6*    Liver Function Tests: Recent Labs  Lab 05/31/19 0353  AST 31  ALT 21  ALKPHOS 103  BILITOT 1.1  PROT 6.8  ALBUMIN 3.9   No results for input(s): LIPASE, AMYLASE in the last 168 hours. No results for input(s): AMMONIA in the last 168 hours.  CBC: Recent Labs  Lab 05/31/19 0353 06/01/19 0449 06/04/19 0342  WBC 18.1* 13.6* 10.9*  HGB 12.5 13.0 10.8*  HCT 37.3 41.1 32.5*  MCV 81.1 85.4 81.5  PLT 287 254 248    Cardiac Enzymes: No results for input(s): CKTOTAL, CKMB, CKMBINDEX, TROPONINI in the last 168 hours.  BNP: Invalid input(s): POCBNP  CBG: Recent Labs  Lab 06/05/19 0729 06/05/19 1143 06/05/19 1631 06/05/19 2058 06/06/19 0800  GLUCAP 118* 154* 163* 148* 148*    Microbiology: Results for orders placed or performed during the hospital encounter of 05/31/19  SARS Coronavirus 2 (CEPHEID - Performed  in [place] hospital lab), Hosp Order     Status: None   Collection Time: 05/31/19  6:35 AM   Specimen: Nasopharyngeal Swab  Result Value Ref Range Status   SARS Coronavirus 2 NEGATIVE NEGATIVE Final    Comment: (NOTE) If result is NEGATIVE SARS-CoV-2 target nucleic acids are NOT DETECTED. The SARS-CoV-2 RNA is generally detectable in upper and lower  respiratory specimens during the acute phase of infection. The lowest  concentration of SARS-CoV-2 viral copies this assay can detect is 250  copies / mL. A negative result does not preclude SARS-CoV-2 infection  and should not be used as the sole basis for treatment or other  patient management decisions.  A negative result may occur with  improper specimen collection / handling, submission of specimen other  than nasopharyngeal swab, presence of viral mutation(s) within the  areas targeted by this assay, and inadequate number of viral copies  (<250 copies / mL). A negative result must be combined with clinical  observations, patient history, and epidemiological information. If result is POSITIVE SARS-CoV-2 target nucleic acids are DETECTED. The SARS-CoV-2 RNA is generally detectable in upper and lower  respiratory specimens dur ing the acute phase of infection.  Positive  results are indicative of active infection with SARS-CoV-2.  Clinical  correlation with patient history and other diagnostic information is  necessary to determine patient infection status.  Positive results do  not rule out bacterial infection or co-infection with other viruses. If result is PRESUMPTIVE POSTIVE SARS-CoV-2 nucleic acids MAY BE PRESENT.   A presumptive positive result was obtained on the submitted specimen  and confirmed on repeat testing.  While 2019 novel coronavirus  (SARS-CoV-2) nucleic acids may be present in the submitted sample  additional confirmatory testing may be necessary for epidemiological  and / or clinical management purposes  to  differentiate between  SARS-CoV-2 and other Sarbecovirus currently known to infect  humans.  If clinically indicated additional testing with an alternate test  methodology [phone]) is advised. The SARS-CoV-2 RNA is generally  detectable in upper and lower respiratory sp ecimens during the acute  phase of infection. The expected result is Negative. Fact Sheet for Patients:  StrictlyIdeas.no Fact Sheet for Healthcare Providers: BankingDealers.co.za This test is not yet approved or cleared by the [place] FDA and has been authorized for detection and/or diagnosis of SARS-CoV-2 by FDA under an Emergency Use Authorization (EUA).  This EUA will remain in effect (meaning this test can be used) for the duration of the COVID-19 declaration under Section 564(b)(1) of the Act, 21 U.S.C. section 360bbb-3(b)(1), unless the authorization is terminated or revoked sooner. Performed at [facility], [address] [place], [place] [address]     Coagulation Studies: Recent Labs    06/04/19 0342 06/05/19 0523 06/06/19 0251  LABPROT 22.3* 26.7* 29.8*  INR 2.0* 2.5* 2.9*    Urinalysis: No results for input(s): COLORURINE, LABSPEC, PHURINE, GLUCOSEU, HGBUR, BILIRUBINUR, KETONESUR, PROTEINUR, UROBILINOGEN, NITRITE, LEUKOCYTESUR in the last 72 hours.  Invalid input(s): APPERANCEUR    Imaging:  No results found.   Assessment & Plan: Pt is a 85 y.o. female with a PMHx of atrial fibrillation, bronchitis, COPD, diabetes mellitus type 2, hypertension, hyperlipidemia, pancreatitis, pneumonia, prior history of CVA, vitamin D B12 deficiency, who was admitted to [facility] on 05/31/2019 for evaluation of left hip fracture.  1.  Hyponatremia. 2.  Hypertension. 3.  Anemia unspecified hemoglobin 10.8.  Plan: We are asked to see the patient for evaluation management of hyponatremia.  She was previously on HCTZ however this has been stopped.   Patient was administered IV fluid hydration however despite this serum sodium dropped to 124.  This is highly suggestive of SIADH which is likely postoperative in nature and may be related to pain.  We will administer the patient tolvaptan 15 mg p.o. x1 to determine response.  Thereafter she may need fluid restriction as well as low-dose furosemide and/or salt tablets.  Further plan to be determined based on response to treatment.

## 2019-06-06 NOTE — Evaluation (Signed)
Occupational Therapy Evaluation Patient Details Name: Monica [name] MRN: [id number] DOB: Nov 05, 1926 Today's Date: 06/06/2019    History of Present Illness Patient s/p L hip hemiarthroplasty due to hip fracture. PMH includes HTN, afib, DM, cancer (L leg, kidneys, lungs, breasts), COPD, HLD, TIAs.   Clinical Impression   Pt seen for OT evaluation this date, POD# from above surgery. Pt was independent in all ADLs prior to surgery, and was using 4WW for fxl mobility. Pt was living in senior living community that provided meals (was able to ambulate to dining hall, but has since been having meals delivered d/t COVID). Pt had cleaning personnel come 1x/wk and this person would also get pt's groceries. Pt otherwise performed IADLs including laundry and was performing this task when fall occurred that lead to L hip fx. Pt is eager to return to PLOF with less pain and improved safety and independence. Pt currently requires MOD A x2 persons with ADL transfers and cannot tolerate standing long enough at time of eval to assess fxl mobility. Pt requires MOD A for LB ADLs d/t THPs and limited AROM of L LE. Pt able to recall 0/3 posterior total hip precautions at start of session and unable to verbalize how to implement during ADL and mobility. Pt instructed in posterior total hip precautions and how to implement, self care skills, falls prevention strategies, home/routines modifications, DME/AE for LB bathing and dressing tasks, compression stocking mgt strategies, and transfer techniques. At end of session, pt able to recall 1/3 posterior total hip precautions. Pt would benefit from additional instruction in self care skills and techniques to help maintain precautions with or without assistive devices to support recall and carryover prior to discharge. Recommend SNF upon discharge.      Follow Up Recommendations  SNF    Equipment Recommendations  3 in 1 bedside commode    Recommendations for Other Services        Precautions / Restrictions Precautions Precautions: Fall;Posterior Hip Precaution Comments: s/p hemiarthroplasty Restrictions Weight Bearing Restrictions: Yes LLE Weight Bearing: Weight bearing as tolerated      Mobility Bed Mobility Overal bed mobility: Needs Assistance Bed Mobility: Supine to Sit     Supine to sit: HOB elevated;Min assist(pt uses bedrail to pull to R side in prep for transition sup to sit)        Transfers Overall transfer level: Needs assistance Equipment used: Rolling walker (2 wheeled) Transfers: Sit to/from [facility] Sit to Stand: Mod assist;+2 physical assistance Stand pivot transfers: Mod assist;+2 physical assistance;From elevated surface(bed only slightly raised, at same level height as chair for SPS t/f)            Balance Overall balance assessment: Needs assistance Sitting-balance support: Feet supported Sitting balance-Leahy Scale: Fair     Standing balance support: Bilateral upper extremity supported Standing balance-Leahy Scale: Poor Standing balance comment: requires MIN/MOD A arm in arm x2 to maintain safe static stand.                           ADL either performed or assessed with clinical judgement   ADL Overall ADL's : Needs assistance/impaired Eating/Feeding: Independent   Grooming: Wash/dry hands;Wash/dry face;Oral care;Set up;Sitting Grooming Details (indicate cue type and reason): unable to ambulate to retrieve ADL items, but if setup on BST, pt able to perform all UB grooming/hygiene. Upper Body Bathing: Set up;Sitting   Lower Body Bathing: Moderate assistance;With adaptive equipment;Sitting/lateral leans Lower Body Bathing  Details (indicate cue type and reason): Pt with THPs requiring AE education for safe LB bathing. Introduced to AE this date, but requires reinforcement for sequence of use. Upper Body Dressing : Set up;Sitting   Lower Body Dressing: Moderate assistance;With  adaptive equipment;Sitting/lateral leans Lower Body Dressing Details (indicate cue type and reason): Pt with THPs requiring AE education for safe LB dressing. Introduced to AE this date, but requires reinforcement for sequence of use. Toilet Transfer: Moderate assistance;+2 for physical assistance;Stand-pivot;BSC   Toileting- Clothing Manipulation and Hygiene: Moderate assistance;+2 for physical assistance Toileting - Clothing Manipulation Details (indicate cue type and reason): +2 persons d/t needing help with clothing mgt over hips while second person helps pt maintain stable static stand. Tub/ Shower Transfer: Moderate assistance;+2 for physical assistance;Stand-pivot   Functional mobility during ADLs: (Unable to engage pt in fxl mobility this date d/ limited standing tolerance, in addition, pt declines when encouraged to trial with FWW stating that she uses 4WW at home despite OT's attempts to educate about FWW being optimal after surgery for pt safety)       Vision Baseline Vision/History: Wears glasses Wears Glasses: At all times Patient Visual Report: No change from baseline       Perception     Praxis      Pertinent Vitals/Pain Pain Assessment: 0-10 Pain Score: 6  Pain Location: L hip Pain Descriptors / Indicators: Aching;Grimacing Pain Intervention(s): Limited activity within patient's tolerance;Monitored during session     Hand Dominance Right   Extremity/Trunk Assessment Upper Extremity Assessment Upper Extremity Assessment: Overall WFL for tasks assessed;RUE deficits/detail;LUE deficits/detail RUE Deficits / Details: 4-/5 MMT of shld/elbow/grip LUE Deficits / Details: 4-/5 MMT of shld/elbow/grip   Lower Extremity Assessment Lower Extremity Assessment: Defer to PT evaluation;LLE deficits/detail LLE Deficits / Details: s/p L hemiarthroplasty LLE: Unable to fully assess due to pain       Communication Communication Communication: No difficulties   Cognition  Arousal/Alertness: Awake/alert Behavior During Therapy: WFL for tasks assessed/performed Overall Cognitive Status: Within Functional Limits for tasks assessed                                 General Comments: Pt oriented to self, sitaution, place (city, but unsure of hospital name) and month, unsure of today's date.   General Comments       Exercises Other Exercises Other Exercises: OT facilitates education re: fall prevention and safety r/t use of AD (FWW recommended although pt declines when offered and states she prefers her 4WW) for ADL transfers and mobility. Pt verbalized understanding, needs reinforecement. Other Exercises: OT facilitates education re: THPs and use of AE for ADL self-care modifications necessary to maintain THPs safely. Pt verbalized understanding, needs reinforcement. Other Exercises: OT facilitates education re: appropriate d/c recommendations SNF vs home to ALF with HHOT/HHPT-SNF is safest most prudent option for pt safety and to maximize independence potential. Pt verbalized understanding.   Shoulder Instructions      Home Living Family/patient expects to be discharged to:: Assisted living                             Home Equipment: Walker - 4 wheels;Grab bars - toilet;Grab bars - tub/shower;Shower seat          Prior Functioning/Environment Level of Independence: Independent with assistive device(s)        Comments: Prior to covid pt was  ambulating to dining hall for meals. Stated she is independent for ADLs, was doing her laundry when fall occurred so was also I with IADLs, however, states she had cleaning lady come 1x/wk and this person also drives to get pt's groceries.        OT Problem List: Decreased strength;Decreased range of motion;Decreased activity tolerance;Impaired balance (sitting and/or standing);Decreased safety awareness;Decreased knowledge of use of DME or AE;Decreased knowledge of precautions;Pain      OT  Treatment/Interventions: Self-care/ADL training;Therapeutic exercise;DME and/or AE instruction;Therapeutic activities;Patient/family education    OT Goals(Current goals can be found in the care plan section) Acute Rehab OT Goals Patient Stated Goal: Pt ultimately wants to return to her senior living community at [facility] OT Goal Formulation: With patient Time For Goal Achievement: 06/20/19 Potential to Achieve Goals: Good(good potential with continued skilled rehab following acute stay)  OT Frequency: Min 2X/week   Barriers to D/C:            Co-evaluation              AM-PAC OT "6 Clicks" Daily Activity     Outcome Measure Help from another person eating meals?: None Help from another person taking care of personal grooming?: A Little Help from another person toileting, which includes using toliet, bedpan, or urinal?: A Lot Help from another person bathing (including washing, rinsing, drying)?: A Lot Help from another person to put on and taking off regular upper body clothing?: A Little Help from another person to put on and taking off regular lower body clothing?: A Lot 6 Click Score: 16   End of Session Equipment Utilized During Treatment: Gait belt Nurse Communication: Other (comment)(communication with CNA re: pt's transfer status.)  Activity Tolerance: Patient tolerated treatment well Patient left: in chair;with call bell/phone within reach;with chair alarm set;Other (comment)(with Renal physician presenting to assess pt at conclusion of therapy.)  OT Visit Diagnosis: Unsteadiness on feet (R26.81);Muscle weakness (generalized) (M62.81);History of falling (Z91.81)                Time: 6803-2122 OT Time Calculation (min): 57 min Charges:  OT General Charges $OT Visit: 1 Visit OT Evaluation $OT Eval Moderate Complexity: 1 Mod OT Treatments $Self Care/Home Management : 23-37 mins $Therapeutic Activity: 8-22 mins  Gerrianne Scale, MS, OTR/L ascom [phone] or  [phone] 06/06/19, 10:59 AM

## 2019-06-06 NOTE — Progress Notes (Signed)
[facility] [place] for warfarin Indication: atrial fibrillation  Allergies  Allergen Reactions  . Sulfa Antibiotics Swelling  . Celecoxib Nausea And Vomiting  . Acetaminophen Itching  . Codeine Rash  . Lyrica [Pregabalin] Rash  . Penicillin G Rash    Has patient had a PCN reaction causing immediate rash, facial/tongue/throat swelling, SOB or lightheadedness with hypotension: Yes Has patient had a PCN reaction causing severe rash involving mucus membranes or skin necrosis: No Has patient had a PCN reaction that required hospitalization: No Has patient had a PCN reaction occurring within the last 10 years: Unknown If all of the above answers are "NO", then may proceed with Cephalosporin use.  Marland Kitchen Petrolatum-Zinc Oxide Rash    Patient Measurements: Height: 5\' 4"  (162.6 cm) Weight: 157 lb (71.2 kg) IBW/kg (Calculated) : 54.7  Vital Signs: Temp: 98.2 F (36.8 C) (07/22 0800) Temp Source: Oral (07/22 0800) BP: 141/60 (07/22 0800) Pulse Rate: 89 (07/22 0800)  Labs: Recent Labs    06/04/19 0342 06/05/19 0523 06/05/19 1347 06/06/19 0251  HGB 10.8*  --   --   --   HCT 32.5*  --   --   --   PLT 248  --   --   --   LABPROT 22.3* 26.7*  --  29.8*  INR 2.0* 2.5*  --  2.9*  CREATININE  --   --  0.72  --     Estimated Creatinine Clearance: 44.3 mL/min (by C-G formula based on SCr of 0.72 mg/dL).   Assessment: 85 year old female presented after a fall resulting in left femoral neck fracture. Patient now s/p left hip hemiarthroplasty. Patient on warfarin PTA for afib. Home dose 4 mg daily except 2 mg on Tuesdays. Pharmacy consulted to dose warfarin. Pt received vit K 2.5 mg 7/16.   Date INR Dose 7/19 1.5         5mg  7/20     2.0         4mg   7/21     2.5         2mg  7/22   2.9  Goal of Therapy:  INR 2-3 Monitor platelets by anticoagulation protocol: Yes   Plan:  INR within therapeutic range today but continues to trend up. Will order low dose  Warfarin 1 mg PO x1 today. INR with morning labs. CBC in AM per protocol.  Rayna Sexton, PharmD, BCPS Clinical Pharmacist 06/06/2019 11:00 AM

## 2019-06-07 DIAGNOSIS — E871 Hypo-osmolality and hyponatremia: Secondary | ICD-10-CM | POA: Diagnosis not present

## 2019-06-07 DIAGNOSIS — I10 Essential (primary) hypertension: Secondary | ICD-10-CM | POA: Diagnosis not present

## 2019-06-07 DIAGNOSIS — S72002A Fracture of unspecified part of neck of left femur, initial encounter for closed fracture: Secondary | ICD-10-CM | POA: Diagnosis not present

## 2019-06-07 DIAGNOSIS — I480 Paroxysmal atrial fibrillation: Secondary | ICD-10-CM | POA: Diagnosis not present

## 2019-06-07 DIAGNOSIS — D649 Anemia, unspecified: Secondary | ICD-10-CM | POA: Diagnosis not present

## 2019-06-07 LAB — CBC
HCT: 30.7 % — ABNORMAL LOW (ref 36.0–46.0)
Hemoglobin: 10.1 g/dL — ABNORMAL LOW (ref 12.0–15.0)
MCH: 26.9 pg (ref 26.0–34.0)
MCHC: 32.9 g/dL (ref 30.0–36.0)
MCV: 81.6 fL (ref 80.0–100.0)
Platelets: 331 10*3/uL (ref 150–400)
RBC: 3.76 MIL/uL — ABNORMAL LOW (ref 3.87–5.11)
RDW: 14.3 % (ref 11.5–15.5)
WBC: 11.2 10*3/uL — ABNORMAL HIGH (ref 4.0–10.5)
nRBC: 0 % (ref 0.0–0.2)

## 2019-06-07 LAB — GLUCOSE, CAPILLARY
Glucose-Capillary: 152 mg/dL — ABNORMAL HIGH (ref 70–99)
Glucose-Capillary: 166 mg/dL — ABNORMAL HIGH (ref 70–99)
Glucose-Capillary: 159 mg/dL — ABNORMAL HIGH (ref 70–99)
Glucose-Capillary: 165 mg/dL — ABNORMAL HIGH (ref 70–99)

## 2019-06-07 LAB — PROTIME-INR
INR: 3.1 — ABNORMAL HIGH (ref 0.8–1.2)
Prothrombin Time: 31.6 seconds — ABNORMAL HIGH (ref 11.4–15.2)

## 2019-06-07 LAB — SODIUM: Sodium: 123 mmol/L — ABNORMAL LOW (ref 135–145)

## 2019-06-07 MED ORDER — FUROSEMIDE 20 MG PO TABS
20.0000 mg | ORAL_TABLET | Freq: Every day | ORAL | Status: DC
  Administered 2019-06-07 – 2019-06-09 (×3): 20 mg via ORAL
  Filled 2019-06-07 (×3): qty 1

## 2019-06-07 MED ORDER — SODIUM CHLORIDE 1 G PO TABS
1.0000 g | ORAL_TABLET | Freq: Three times a day (TID) | ORAL | Status: DC
  Administered 2019-06-07 – 2019-06-09 (×7): 1 g via ORAL
  Filled 2019-06-07 (×7): qty 1

## 2019-06-07 MED ORDER — IPRATROPIUM-ALBUTEROL 0.5-2.5 (3) MG/3ML IN SOLN
3.0000 mL | Freq: Four times a day (QID) | RESPIRATORY_TRACT | Status: DC
  Administered 2019-06-07 – 2019-06-09 (×8): 3 mL via RESPIRATORY_TRACT
  Filled 2019-06-07 (×8): qty 3

## 2019-06-07 MED ORDER — OXYCODONE HCL 5 MG PO TABS: 5.0000 mg | ORAL_TABLET | ORAL | 0 refills | Status: DC | PRN

## 2019-06-07 NOTE — Progress Notes (Signed)
[facility]  ROUNDING NOTE   Subjective:  Patient seen at bedside. Serum sodium remains low at 123.    Objective:  Vital signs in last 24 hours:  Temp:  [97.7 F (36.5 C)-98.4 F (36.9 C)] 98.4 F (36.9 C) (07/23 0802) Pulse Rate:  [89-92] 91 (07/23 0802) Resp:  [17-18] 18 (07/23 0802) BP: (130-139)/(59-61) 130/61 (07/23 0802) SpO2:  [96 %-99 %] 99 % (07/23 1416)  Weight change:  Filed Weights   05/31/19 0340  Weight: 71.2 kg    Intake/Output: I/O last 3 completed shifts: In: 49 [P.O.:970] Out: 1400 [Urine:1400]   Intake/Output this shift:  Total I/O In: 480 [P.O.:480] Out: 650 [Urine:650]  Physical Exam: General: No acute distress  Head: Normocephalic, atraumatic. Moist oral mucosal membranes  Eyes: Anicteric  Neck: Supple, trachea midline  Lungs:  Clear to auscultation, normal effort  Heart: S1S2 no rubs  Abdomen:  Soft, nontender, bowel sounds present  Extremities: No peripheral edema.  Neurologic: Awake, alert, following commands  Skin: No lesions       Basic Metabolic Panel: Recent Labs  Lab 06/01/19 0449 06/02/19 0419 06/03/19 0522 06/05/19 1347 06/06/19 1111 06/06/19 1846 06/07/19 0308  NA 131* 126* 128* 124* 123* 123* 123*  K 5.3* 4.6 4.5 4.4 4.4  --   --   CL 96* 93* 93* 91* 89*  --   --   CO2 26 25 24 25 24   --   --   GLUCOSE 147* 161* 169* 190* 155*  --   --   BUN 17 23 29* 23 23  --   --   CREATININE 1.07* 0.83 0.89 0.72 0.84  --   --   CALCIUM 9.2 9.0 9.0 8.6* 8.8*  --   --     Liver Function Tests: No results for input(s): AST, ALT, ALKPHOS, BILITOT, PROT, ALBUMIN in the last 168 hours. No results for input(s): LIPASE, AMYLASE in the last 168 hours. No results for input(s): AMMONIA in the last 168 hours.  CBC: Recent Labs  Lab 06/01/19 0449 06/04/19 0342 06/07/19 0308  WBC 13.6* 10.9* 11.2*  HGB 13.0 10.8* 10.1*  HCT 41.1 32.5* 30.7*  MCV 85.4 81.5 81.6  PLT 254 248 331    Cardiac Enzymes: No results  for input(s): CKTOTAL, CKMB, CKMBINDEX, TROPONINI in the last 168 hours.  BNP: Invalid input(s): POCBNP  CBG: Recent Labs  Lab 06/06/19 1129 06/06/19 1730 06/06/19 2051 06/07/19 0759 06/07/19 1148  GLUCAP 144* 126* 159* 152* 166*    Microbiology: Results for orders placed or performed during the hospital encounter of 05/31/19  SARS Coronavirus 2 (CEPHEID - Performed in [place] hospital lab), Hosp Order     Status: None   Collection Time: 05/31/19  6:35 AM   Specimen: Nasopharyngeal Swab  Result Value Ref Range Status   SARS Coronavirus 2 NEGATIVE NEGATIVE Final    Comment: (NOTE) If result is NEGATIVE SARS-CoV-2 target nucleic acids are NOT DETECTED. The SARS-CoV-2 RNA is generally detectable in upper and lower  respiratory specimens during the acute phase of infection. The lowest  concentration of SARS-CoV-2 viral copies this assay can detect is 250  copies / mL. A negative result does not preclude SARS-CoV-2 infection  and should not be used as the sole basis for treatment or other  patient management decisions.  A negative result may occur with  improper specimen collection / handling, submission of specimen other  than nasopharyngeal swab, presence of viral mutation(s) within the  areas  targeted by this assay, and inadequate number of viral copies  (<250 copies / mL). A negative result must be combined with clinical  observations, patient history, and epidemiological information. If result is POSITIVE SARS-CoV-2 target nucleic acids are DETECTED. The SARS-CoV-2 RNA is generally detectable in upper and lower  respiratory specimens dur ing the acute phase of infection.  Positive  results are indicative of active infection with SARS-CoV-2.  Clinical  correlation with patient history and other diagnostic information is  necessary to determine patient infection status.  Positive results do  not rule out bacterial infection or co-infection with other viruses. If result  is PRESUMPTIVE POSTIVE SARS-CoV-2 nucleic acids MAY BE PRESENT.   A presumptive positive result was obtained on the submitted specimen  and confirmed on repeat testing.  While 2019 novel coronavirus  (SARS-CoV-2) nucleic acids may be present in the submitted sample  additional confirmatory testing may be necessary for epidemiological  and / or clinical management purposes  to differentiate between  SARS-CoV-2 and other Sarbecovirus currently known to infect humans.  If clinically indicated additional testing with an alternate test  methodology [phone]) is advised. The SARS-CoV-2 RNA is generally  detectable in upper and lower respiratory sp ecimens during the acute  phase of infection. The expected result is Negative. Fact Sheet for Patients:  StrictlyIdeas.no Fact Sheet for Healthcare Providers: BankingDealers.co.za This test is not yet approved or cleared by the [place] FDA and has been authorized for detection and/or diagnosis of SARS-CoV-2 by FDA under an Emergency Use Authorization (EUA).  This EUA will remain in effect (meaning this test can be used) for the duration of the COVID-19 declaration under Section 564(b)(1) of the Act, 21 U.S.C. section 360bbb-3(b)(1), unless the authorization is terminated or revoked sooner. Performed at [facility], [address] [place], [place] [id number]     Coagulation Studies: Recent Labs    06/05/19 0523 06/06/19 0251 06/07/19 0308  LABPROT 26.7* 29.8* 31.6*  INR 2.5* 2.9* 3.1*    Urinalysis: No results for input(s): COLORURINE, LABSPEC, PHURINE, GLUCOSEU, HGBUR, BILIRUBINUR, KETONESUR, PROTEINUR, UROBILINOGEN, NITRITE, LEUKOCYTESUR in the last 72 hours.  Invalid input(s): APPERANCEUR    Imaging: No results found.   Medications:    . amLODipine  10 mg Oral Daily  . B-complex with vitamin C  1 tablet Oral Daily  . famotidine  20 mg Oral Daily  . ferrous  sulfate  325 mg Oral BID WC  . gabapentin  600 mg Oral QHS  . insulin aspart  0-9 Units Subcutaneous TID WC  . ipratropium-albuterol  3 mL Nebulization Q6H  . levofloxacin  500 mg Oral Daily  . loratadine  10 mg Oral Daily  . magnesium oxide  400 mg Oral Daily  . montelukast  10 mg Oral QHS  . nystatin cream   Topical BID  . oxybutynin  10 mg Oral Daily  . pravastatin  40 mg Oral Daily  . senna-docusate  1 tablet Oral BID  . theophylline  100 mg Oral QODAY  . tolvaptan  15 mg Oral Q24H  . Warfarin - Pharmacist Dosing Inpatient   Does not apply q1800   acetaminophen, bisacodyl, guaiFENesin, HYDROmorphone (DILAUDID) injection, magnesium hydroxide, menthol-cetylpyridinium **OR** phenol, metoCLOPramide **OR** metoCLOPramide (REGLAN) injection, ondansetron **OR** ondansetron (ZOFRAN) IV, oxyCODONE, oxyCODONE, sodium phosphate, traZODone  Assessment/ Plan:  85 y.o. female with a PMHx of atrial fibrillation, bronchitis, COPD, diabetes mellitus type 2, hypertension, hyperlipidemia, pancreatitis, pneumonia, prior history of CVA, vitamin D B12 deficiency, who  was admitted to [facility] on 05/31/2019 for evaluation of left hip fracture.  1.  Hyponatremia. 2.  Hypertension. 3.  Anemia unspecified hemoglobin 10.8.  Plan: Patient started treatment with tolvaptan yesterday.  She did appear to have good urine output however serum sodium remains low at 123.  Add salt tablets 1 g p.o. 3 times daily as well as furosemide 20 mg daily.  Continue to monitor serum sodium closely.  If this regimen does not work we may need to consider 3% saline.   LOS: 7 Logan [place] 7/23/20203:31 PM

## 2019-06-07 NOTE — Progress Notes (Signed)
Patient ID: Monica [name], female   DOB: 04-16-1926, 85 y.o.   MRN: [id number]  [facility] PROGRESS NOTE  Monica [name] [id number] DOB: [birth year] DOA: 05/31/2019 PCP: [name], NP  HPI/Subjective: Patient still having pain in the hip.  States her appetite is not that good.  Had a bowel movement the day before yesterday.  Objective: Vitals:   06/06/19 1539 06/06/19 2345  BP: 138/61 (!) 139/59  Pulse: 89 92  Resp: 18 17  Temp: 97.7 F (36.5 C) 98 F (36.7 C)  SpO2: 97% 96%    Filed Weights   05/31/19 0340  Weight: 71.2 kg    ROS: Review of Systems  Constitutional: Negative for chills and fever.  Eyes: Negative for blurred vision.  Respiratory: Negative for cough and shortness of breath.   Cardiovascular: Negative for chest pain.  Gastrointestinal: Negative for abdominal pain, constipation, diarrhea, nausea and vomiting.  Genitourinary: Negative for dysuria.  Musculoskeletal: Positive for joint pain.  Neurological: Negative for dizziness and headaches.   Exam: Physical Exam  Constitutional: She is oriented to person, place, and time.  HENT:  Nose: No mucosal edema.  Mouth/Throat: No oropharyngeal exudate or posterior oropharyngeal edema.  Eyes: Pupils are equal, round, and reactive to light. Conjunctivae, EOM and lids are normal.  Neck: No JVD present. Carotid bruit is not present. No edema present. No thyroid mass and no thyromegaly present.  Cardiovascular: S1 normal and S2 normal. Exam reveals no gallop.  No murmur heard. Pulses:      Dorsalis pedis pulses are 2+ on the right side and 2+ on the left side.  Respiratory: No respiratory distress. She has decreased breath sounds in the right lower field and the left lower field. She has no wheezes. She has no rhonchi. She has no rales.  GI: Soft. Bowel sounds are normal. There is no abdominal tenderness.  Musculoskeletal:     Right ankle: She exhibits swelling.     Left ankle: She exhibits swelling.   Lymphadenopathy:    She has no cervical adenopathy.  Neurological: She is alert and oriented to person, place, and time. No cranial nerve deficit.  Skin: Skin is warm. No rash noted. Nails show no clubbing.  Psychiatric: She has a normal mood and affect.      Data Reviewed: Basic Metabolic Panel: Recent Labs  Lab 06/01/19 0449 06/02/19 0419 06/03/19 0522 06/05/19 1347 06/06/19 1111 06/06/19 1846 06/07/19 0308  NA 131* 126* 128* 124* 123* 123* 123*  K 5.3* 4.6 4.5 4.4 4.4  --   --   CL 96* 93* 93* 91* 89*  --   --   CO2 26 25 24 25 24   --   --   GLUCOSE 147* 161* 169* 190* 155*  --   --   BUN 17 23 29* 23 23  --   --   CREATININE 1.07* 0.83 0.89 0.72 0.84  --   --   CALCIUM 9.2 9.0 9.0 8.6* 8.8*  --   --    CBC: Recent Labs  Lab 06/01/19 0449 06/04/19 0342 06/07/19 0308  WBC 13.6* 10.9* 11.2*  HGB 13.0 10.8* 10.1*  HCT 41.1 32.5* 30.7*  MCV 85.4 81.5 81.6  PLT 254 248 331    CBG: Recent Labs  Lab 06/05/19 2058 06/06/19 0800 06/06/19 1129 06/06/19 1730 06/06/19 2051  GLUCAP 148* 148* 144* 126* 159*    Recent Results (from the past 240 hour(s))  SARS Coronavirus 2 (CEPHEID - Performed in [facility] hospital lab), Hosp Order     Status: None   Collection Time: 05/31/19  6:35 AM   Specimen: Nasopharyngeal Swab  Result Value Ref Range Status   SARS Coronavirus 2 NEGATIVE NEGATIVE Final    Comment: (NOTE) If result is NEGATIVE SARS-CoV-2 target nucleic acids are NOT DETECTED. The SARS-CoV-2 RNA is generally detectable in upper and lower  respiratory specimens during the acute phase of infection. The lowest  concentration of SARS-CoV-2 viral copies this assay can detect is 250  copies / mL. A negative result does not preclude SARS-CoV-2 infection  and should not be used as the sole basis for treatment or other  patient management decisions.  A negative result may occur with  improper specimen collection / handling, submission of specimen other  than  nasopharyngeal swab, presence of viral mutation(s) within the  areas targeted by this assay, and inadequate number of viral copies  (<250 copies / mL). A negative result must be combined with clinical  observations, patient history, and epidemiological information. If result is POSITIVE SARS-CoV-2 target nucleic acids are DETECTED. The SARS-CoV-2 RNA is generally detectable in upper and lower  respiratory specimens dur ing the acute phase of infection.  Positive  results are indicative of active infection with SARS-CoV-2.  Clinical  correlation with patient history and other diagnostic information is  necessary to determine patient infection status.  Positive results do  not rule out bacterial infection or co-infection with other viruses. If result is PRESUMPTIVE POSTIVE SARS-CoV-2 nucleic acids MAY BE PRESENT.   A presumptive positive result was obtained on the submitted specimen  and confirmed on repeat testing.  While 2019 novel coronavirus  (SARS-CoV-2) nucleic acids may be present in the submitted sample  additional confirmatory testing may be necessary for epidemiological  and / or clinical management purposes  to differentiate between  SARS-CoV-2 and other Sarbecovirus currently known to infect humans.  If clinically indicated additional testing with an alternate test  methodology [phone]) is advised. The SARS-CoV-2 RNA is generally  detectable in upper and lower respiratory sp ecimens during the acute  phase of infection. The expected result is Negative. Fact Sheet for Patients:  StrictlyIdeas.no Fact Sheet for Healthcare Providers: BankingDealers.co.za This test is not yet approved or cleared by the [place] FDA and has been authorized for detection and/or diagnosis of SARS-CoV-2 by FDA under an Emergency Use Authorization (EUA).  This EUA will remain in effect (meaning this test can be used) for the duration of  the COVID-19 declaration under Section 564(b)(1) of the Act, 21 U.S.C. section 360bbb-3(b)(1), unless the authorization is terminated or revoked sooner. Performed at [facility], [address] [place], [place] [address]      Scheduled Meds: . amLODipine  10 mg Oral Daily  . B-complex with vitamin C  1 tablet Oral Daily  . famotidine  20 mg Oral Daily  . ferrous sulfate  325 mg Oral BID WC  . gabapentin  600 mg Oral QHS  . insulin aspart  0-9 Units Subcutaneous TID WC  . ipratropium-albuterol  3 mL Nebulization Q6H  . levofloxacin  500 mg Oral Daily  . loratadine  10 mg Oral Daily  . magnesium oxide  400 mg Oral Daily  . montelukast  10 mg Oral QHS  . nystatin cream   Topical BID  . oxybutynin  10 mg Oral Daily  . pravastatin  40 mg Oral Daily  . senna-docusate  1 tablet Oral BID  . theophylline  100 mg Oral QODAY  . tolvaptan  15 mg Oral Q24H  . Warfarin - Pharmacist Dosing Inpatient   Does not apply q1800   Continuous Infusions:  Assessment/Plan:  1.  Hyponatremia.  Patient does take hydrochlorothiazide at home and is likely the culprit.  Sodium still too low to be discharged at 123 today.  Repeat tolvaptan dosing today. 2.  Left hip fracture requiring operative repair.  Patient will go out to rehab once sodium is better 3.  Essential hypertension on Norvasc 4.  Atrial fibrillation back on warfarin.  Pharmacy dosing. 5.  Acute hypoxic respiratory failure.  Check pulse ox on room air to try to get the patient off oxygen.. 6.  Atelectasis versus infiltrates on chest x-ray on the 20th and patient placed on Levaquin. 7.  Type 2 diabetes mellitus on sliding scale insulin. 8.  Diabetic neuropathy on gabapentin  Code Status:     Code Status Orders  (From admission, onward)         Start     Ordered   05/31/19 0506  Full code  Continuous     05/31/19 0514        Code Status History    Date Active Date Inactive Code Status Order ID Comments User Context    01/23/2018 2150 01/26/2018 1345 Full Code [id number]  Dustin Flock, MD Inpatient   08/08/2016 1200 08/11/2016 1241 Full Code [id number]  Baxter Hire, MD Inpatient   Advance Care Planning Activity     Family Communication: Spoke with daughter [name] yesterday afternoon Disposition Plan: Once sodium is better the patient can go out to rehab  Consultants:  Orthopedic surgery  Nephrology  Procedures:  Left hip repair  Antibiotics:  Levaquin  Time spent: 1 minutes  [place]

## 2019-06-07 NOTE — Progress Notes (Signed)
Abdomen soft and distended. Bowel sounds active. No c/o of pain or tenderness reported.

## 2019-06-07 NOTE — Discharge Instructions (Signed)
INSTRUCTIONS AFTER Surgery  o Remove items at home which could result in a fall. This includes throw rugs or furniture in walking pathways o ICE to the affected joint every three hours while awake for 30 minutes at a time, for at least the first 3-5 days, and then as needed for pain and swelling.  Continue to use ice for pain and swelling. You may notice swelling that will progress down to the foot and ankle.  This is normal after surgery.  Elevate your leg when you are not up walking on it.   o Continue to use the breathing machine you got in the hospital (incentive spirometer) which will help keep your temperature down.  It is common for your temperature to cycle up and down following surgery, especially at night when you are not up moving around and exerting yourself.  The breathing machine keeps your lungs expanded and your temperature down.   DIET:  As you were doing prior to hospitalization, we recommend a well-balanced diet.  DRESSING / WOUND CARE / SHOWERING  Dressing changed as needed.  Reinforce if serosanguineous drainage.  No staple removal for 2 weeks.  Staples can then be removed after 2 weeks with application of Steri-Strips and new bandages.  ACTIVITY  o Increase activity slowly as tolerated, but follow the weight bearing instructions below.   o No driving for 6 weeks or until further direction given by your physician.  You cannot drive while taking narcotics.  o No lifting or carrying greater than 10 lbs. until further directed by your surgeon. o Avoid periods of inactivity such as sitting longer than an hour when not asleep. This helps prevent blood clots.  o You may return to work once you are authorized by your doctor.     WEIGHT BEARING  Weightbearing as tolerated   EXERCISES Gait training and ambulation.  CONSTIPATION  Constipation is defined medically as fewer than three stools per week and severe constipation as less than one stool per week.  Even if you have a  regular bowel pattern at home, your normal regimen is likely to be disrupted due to multiple reasons following surgery.  Combination of anesthesia, postoperative narcotics, change in appetite and fluid intake all can affect your bowels.   YOU MUST use at least one of the following options; they are listed in order of increasing strength to get the job done.  They are all available over the counter, and you may need to use some, POSSIBLY even all of these options:    Drink plenty of fluids (prune juice may be helpful) and high fiber foods Colace 100 mg by mouth twice a day  Senokot for constipation as directed and as needed Dulcolax (bisacodyl), take with full glass of water  Miralax (polyethylene glycol) once or twice a day as needed.  If you have tried all these things and are unable to have a bowel movement in the first 3-4 days after surgery call either your surgeon or your primary doctor.    If you experience loose stools or diarrhea, hold the medications until you stool forms back up.  If your symptoms do not get better within 1 week or if they get worse, check with your doctor.  If you experience "the worst abdominal pain ever" or develop nausea or vomiting, please contact the office immediately for further recommendations for treatment.   ITCHING:  If you experience itching with your medications, try taking only a single pain pill, or even half  a pain pill at a time.  You can also use Benadryl over the counter for itching or also to help with sleep.   TED HOSE STOCKINGS:  Use stockings on both legs until for at least 2 weeks or as directed by physician office. They may be removed at night for sleeping.  MEDICATIONS:  See your medication summary on the After Visit Summary that nursing will review with you.  You may have some home medications which will be placed on hold until you complete the course of blood thinner medication.  It is important for you to complete the blood thinner  medication as prescribed.  PRECAUTIONS:  If you experience chest pain or shortness of breath - call 911 immediately for transfer to the hospital emergency department.   If you develop a fever greater that 101 F, purulent drainage from wound, increased redness or drainage from wound, foul odor from the wound/dressing, or calf pain - CONTACT YOUR SURGEON.                                                   FOLLOW-UP APPOINTMENTS:  If you do not already have a post-op appointment, please call the office for an appointment to be seen by your surgeon.  Guidelines for how soon to be seen are listed in your After Visit Summary, but are typically between 1-4 weeks after surgery.  OTHER INSTRUCTIONS:     MAKE SURE YOU:   Understand these instructions.   Get help right away if you are not doing well or get worse.    Thank you for letting us be a part of your medical care team.  It is a privilege we respect greatly.  We hope these instructions will help you stay on track for a fast and full recovery!

## 2019-06-07 NOTE — Progress Notes (Signed)
PT Cancellation Note  Patient Details Name: Monica [name] MRN: [id number] DOB: 05-Dec-1925   Cancelled Treatment:    Reason Eval/Treat Not Completed: Medical issues which prohibited therapy(Holding PT treatment at this time 2/2 Na+ outside of appropriate range for exertional activity (Na+: 123). Will monitor remotely and resume treatment once appropriate.)  . 8:44 AM, 06/07/19 Etta Grandchild, PT, DPT Physical Therapist - [facility]  [phone] ([place])    Eugen Jeansonne C 06/07/2019, 8:44 AM

## 2019-06-07 NOTE — Progress Notes (Signed)
[place] for warfarin Indication: atrial fibrillation  Allergies  Allergen Reactions  . Sulfa Antibiotics Swelling  . Celecoxib Nausea And Vomiting  . Acetaminophen Itching  . Codeine Rash  . Lyrica [Pregabalin] Rash  . Penicillin G Rash    Has patient had a PCN reaction causing immediate rash, facial/tongue/throat swelling, SOB or lightheadedness with hypotension: Yes Has patient had a PCN reaction causing severe rash involving mucus membranes or skin necrosis: No Has patient had a PCN reaction that required hospitalization: No Has patient had a PCN reaction occurring within the last 10 years: Unknown If all of the above answers are "NO", then may proceed with Cephalosporin use.  Marland Kitchen Petrolatum-Zinc Oxide Rash    Patient Measurements: Height: 5\' 4"  (162.6 cm) Weight: 157 lb (71.2 kg) IBW/kg (Calculated) : 54.7  Vital Signs: Temp: 98 F (36.7 C) (07/22 2345) BP: 139/59 (07/22 2345) Pulse Rate: 92 (07/22 2345)  Labs: Recent Labs    06/05/19 0523 06/05/19 1347 06/06/19 0251 06/06/19 1111 06/07/19 0308  HGB  --   --   --   --  10.1*  HCT  --   --   --   --  30.7*  PLT  --   --   --   --  331  LABPROT 26.7*  --  29.8*  --  31.6*  INR 2.5*  --  2.9*  --  3.1*  CREATININE  --  0.72  --  0.84  --     Estimated Creatinine Clearance: 42.2 mL/min (by C-G formula based on SCr of 0.84 mg/dL).   Assessment: 85 year old female presented after a fall resulting in left femoral neck fracture. Patient now s/p left hip hemiarthroplasty. Patient on warfarin PTA for afib. Home dose 4 mg daily except 2 mg on Tuesdays. Pharmacy consulted to dose warfarin. Pt received vit K 2.5 mg 7/16.   Date INR Dose 7/19 1.5         5mg  7/20     2.0         4mg   7/21     2.5         2mg  7/22   2.9   1mg  7/23     3.1  Goal of Therapy:  INR 2-3 Monitor platelets by anticoagulation protocol: Yes   Plan:  INR slightly above desired range of 2.0-3.0 but  having slower trend up. Anticipate INR to decrease tomorrow based on previous doses. Will order low dose Warfarin 1 mg PO x1 again today. DDI: Levaquin  INR with morning labs. CBC in AM per protocol.  Chinita Greenland PharmD Clinical Pharmacist 06/07/2019

## 2019-06-07 NOTE — Progress Notes (Signed)
   Subjective: 4 Days Post-Op Procedure(s) (LRB): ARTHROPLASTY BIPOLAR HIP (HEMIARTHROPLASTY) (Left) Patient reports pain as improving.  She feels better today. Patient is well, and has had no acute complaints or problems Progressing very slowly with therapy. Plan is to go Rehab after hospital stay. no nausea and no vomiting Patient denies any chest pains or shortness of breath.  Objective: Vital signs in last 24 hours: Temp:  [97.7 F (36.5 C)-98.2 F (36.8 C)] 98 F (36.7 C) (07/22 2345) Pulse Rate:  [89-92] 92 (07/22 2345) Resp:  [17-18] 17 (07/22 2345) BP: (138-141)/(59-61) 139/59 (07/22 2345) SpO2:  [87 %-97 %] 96 % (07/22 2345) well approximated incision Heels are non tender and elevated off the bed using rolled towels Intake/Output from previous day: 07/22 0701 - 07/23 0700 In: 970 [P.O.:970] Out: 1100 [Urine:1100] Intake/Output this shift: Total I/O In: 370 [P.O.:370] Out: 850 [Urine:850]  Recent Labs    06/07/19 0308  HGB 10.1*   Recent Labs    06/07/19 0308  WBC 11.2*  RBC 3.76*  HCT 30.7*  PLT 331   Recent Labs    06/05/19 1347 06/06/19 1111 06/06/19 1846 06/07/19 0308  NA 124* 123* 123* 123*  K 4.4 4.4  --   --   CL 91* 89*  --   --   CO2 25 24  --   --   BUN 23 23  --   --   CREATININE 0.72 0.84  --   --   GLUCOSE 190* 155*  --   --   CALCIUM 8.6* 8.8*  --   --    Recent Labs    06/06/19 0251 06/07/19 0308  INR 2.9* 3.1*    EXAM General - Patient is Alert and Confused Extremity - Neurologically intact Neurovascular intact Sensation intact distally Intact pulses distally Dorsiflexion/Plantar flexion intact No cellulitis present Compartment soft Dressing - Saturated with serosanguineous drainage secondary to being on Coumadin. Motor Function - intact, moving foot and toes well on exam.    Past Medical History:  Diagnosis Date  . Atrial fibrillation ([place])   . Bronchitis   . Cancer ([place])    Left leg growth, kidneys, lungs  and breasts  . Carcinoma of unknown primary ([place])   . COPD (chronic obstructive pulmonary disease) ([place])   . Diabetes mellitus without complication ([place])   . Hyperlipidemia   . Hypertension   . Pancreatitis   . Pneumonia   . Stroke ([place])    TIA's  . Vitamin B12 deficiency     Assessment/Plan: 4 Days Post-Op Procedure(s) (LRB): ARTHROPLASTY BIPOLAR HIP (HEMIARTHROPLASTY) (Left) Active Problems:   'light-for-dates' infant with signs of fetal malnutrition   Closed left hip fracture (HCC)   Pressure injury of skin  Estimated body mass index is 26.95 kg/m as calculated from the following:   Height as of this encounter: 5\' 4"  (1.626 m).   Weight as of this encounter: 71.2 kg. Up with therapy Discharge to SNF when medically stable  Labs: INR today 3.1 DVT Prophylaxis - Coumadin, Foot Pumps and TED hose Weight-Bearing as tolerated to left leg Please change dressing today and apply a new honeycomb dressing Patient will need to follow-up in clinical clinic in 2 weeks Continue Coumadin Patient needs bowel movement  Reche Dixon, PA-C [place] 06/07/2019, 6:17 AM

## 2019-06-08 DIAGNOSIS — D649 Anemia, unspecified: Secondary | ICD-10-CM | POA: Diagnosis not present

## 2019-06-08 DIAGNOSIS — S72002A Fracture of unspecified part of neck of left femur, initial encounter for closed fracture: Secondary | ICD-10-CM | POA: Diagnosis not present

## 2019-06-08 DIAGNOSIS — I480 Paroxysmal atrial fibrillation: Secondary | ICD-10-CM | POA: Diagnosis not present

## 2019-06-08 DIAGNOSIS — I10 Essential (primary) hypertension: Secondary | ICD-10-CM | POA: Diagnosis not present

## 2019-06-08 DIAGNOSIS — E871 Hypo-osmolality and hyponatremia: Secondary | ICD-10-CM | POA: Diagnosis not present

## 2019-06-08 LAB — SODIUM
Sodium: 126 mmol/L — ABNORMAL LOW (ref 135–145)
Sodium: 127 mmol/L — ABNORMAL LOW (ref 135–145)
Sodium: 128 mmol/L — ABNORMAL LOW (ref 135–145)

## 2019-06-08 LAB — URINALYSIS, ROUTINE W REFLEX MICROSCOPIC
Bilirubin Urine: NEGATIVE
Glucose, UA: NEGATIVE mg/dL
Hgb urine dipstick: NEGATIVE
Ketones, ur: NEGATIVE mg/dL
Nitrite: NEGATIVE
Protein, ur: NEGATIVE mg/dL
Specific Gravity, Urine: 1.005 (ref 1.005–1.030)
pH: 5 (ref 5.0–8.0)

## 2019-06-08 LAB — GLUCOSE, CAPILLARY
Glucose-Capillary: 149 mg/dL — ABNORMAL HIGH (ref 70–99)
Glucose-Capillary: 166 mg/dL — ABNORMAL HIGH (ref 70–99)
Glucose-Capillary: 154 mg/dL — ABNORMAL HIGH (ref 70–99)
Glucose-Capillary: 157 mg/dL — ABNORMAL HIGH (ref 70–99)

## 2019-06-08 LAB — OSMOLALITY, URINE: Osmolality, Ur: 192 mOsm/kg — ABNORMAL LOW (ref 300–900)

## 2019-06-08 LAB — NOVEL CORONAVIRUS, NAA (HOSP ORDER, SEND-OUT TO REF LAB; TAT 18-24 HRS): SARS-CoV-2, NAA: NOT DETECTED

## 2019-06-08 LAB — OSMOLALITY: Osmolality: 275 mOsm/kg (ref 275–295)

## 2019-06-08 LAB — PROTIME-INR
INR: 2.3 — ABNORMAL HIGH (ref 0.8–1.2)
Prothrombin Time: 25 seconds — ABNORMAL HIGH (ref 11.4–15.2)

## 2019-06-08 LAB — TSH: TSH: 2.751 u[IU]/mL (ref 0.350–4.500)

## 2019-06-08 MED ORDER — POLYETHYLENE GLYCOL 3350 17 G PO PACK
17.0000 g | PACK | Freq: Every day | ORAL | Status: DC
  Administered 2019-06-08 – 2019-06-09 (×2): 17 g via ORAL
  Filled 2019-06-08 (×2): qty 1

## 2019-06-08 MED ORDER — WARFARIN SODIUM 3 MG PO TABS
3.0000 mg | ORAL_TABLET | Freq: Once | ORAL | Status: AC
  Administered 2019-06-08: 3 mg via ORAL
  Filled 2019-06-08: qty 1

## 2019-06-08 NOTE — Progress Notes (Signed)
[facility]  ROUNDING NOTE   Subjective:  Sodium up to 126 today. Good urine output of 1.6 L.   Objective:  Vital signs in last 24 hours:  Temp:  [97.9 F (36.6 C)-98.6 F (37 C)] 98.6 F (37 C) (07/24 0744) Pulse Rate:  [96-104] 98 (07/24 0744) Resp:  [15-18] 16 (07/24 0744) BP: (128-151)/(58-61) 142/59 (07/24 0744) SpO2:  [92 %-100 %] 99 % (07/24 0806)  Weight change:  Filed Weights   05/31/19 0340  Weight: 71.2 kg    Intake/Output: I/O last 3 completed shifts: In: 7564 [P.O.:1220] Out: 2450 [Urine:2450]   Intake/Output this shift:  Total I/O In: 300 [P.O.:300] Out: -   Physical Exam: General: No acute distress  Head: Normocephalic, atraumatic. Moist oral mucosal membranes  Eyes: Anicteric  Neck: Supple, trachea midline  Lungs:  Clear to auscultation, normal effort  Heart: S1S2 no rubs  Abdomen:  Soft, nontender, bowel sounds present  Extremities: No peripheral edema.  Neurologic: Awake, alert, following commands  Skin: No lesions       Basic Metabolic Panel: Recent Labs  Lab 06/02/19 0419 06/03/19 0522 06/05/19 1347 06/06/19 1111 06/06/19 1846 06/07/19 0308 06/08/19 0332  NA 126* 128* 124* 123* 123* 123* 126*  K 4.6 4.5 4.4 4.4  --   --   --   CL 93* 93* 91* 89*  --   --   --   CO2 25 24 25 24   --   --   --   GLUCOSE 161* 169* 190* 155*  --   --   --   BUN 23 29* 23 23  --   --   --   CREATININE 0.83 0.89 0.72 0.84  --   --   --   CALCIUM 9.0 9.0 8.6* 8.8*  --   --   --     Liver Function Tests: No results for input(s): AST, ALT, ALKPHOS, BILITOT, PROT, ALBUMIN in the last 168 hours. No results for input(s): LIPASE, AMYLASE in the last 168 hours. No results for input(s): AMMONIA in the last 168 hours.  CBC: Recent Labs  Lab 06/04/19 0342 06/07/19 0308  WBC 10.9* 11.2*  HGB 10.8* 10.1*  HCT 32.5* 30.7*  MCV 81.5 81.6  PLT 248 331    Cardiac Enzymes: No results for input(s): CKTOTAL, CKMB, CKMBINDEX, TROPONINI in  the last 168 hours.  BNP: Invalid input(s): POCBNP  CBG: Recent Labs  Lab 06/07/19 1148 06/07/19 1650 06/07/19 2056 06/08/19 0742 06/08/19 1125  GLUCAP 166* 159* 165* 149* 166*    Microbiology: Results for orders placed or performed during the hospital encounter of 05/31/19  SARS Coronavirus 2 (CEPHEID - Performed in [place] hospital lab), Hosp Order     Status: None   Collection Time: 05/31/19  6:35 AM   Specimen: Nasopharyngeal Swab  Result Value Ref Range Status   SARS Coronavirus 2 NEGATIVE NEGATIVE Final    Comment: (NOTE) If result is NEGATIVE SARS-CoV-2 target nucleic acids are NOT DETECTED. The SARS-CoV-2 RNA is generally detectable in upper and lower  respiratory specimens during the acute phase of infection. The lowest  concentration of SARS-CoV-2 viral copies this assay can detect is 250  copies / mL. A negative result does not preclude SARS-CoV-2 infection  and should not be used as the sole basis for treatment or other  patient management decisions.  A negative result may occur with  improper specimen collection / handling, submission of specimen other  than nasopharyngeal swab, presence  of viral mutation(s) within the  areas targeted by this assay, and inadequate number of viral copies  (<250 copies / mL). A negative result must be combined with clinical  observations, patient history, and epidemiological information. If result is POSITIVE SARS-CoV-2 target nucleic acids are DETECTED. The SARS-CoV-2 RNA is generally detectable in upper and lower  respiratory specimens dur ing the acute phase of infection.  Positive  results are indicative of active infection with SARS-CoV-2.  Clinical  correlation with patient history and other diagnostic information is  necessary to determine patient infection status.  Positive results do  not rule out bacterial infection or co-infection with other viruses. If result is PRESUMPTIVE POSTIVE SARS-CoV-2 nucleic acids MAY  BE PRESENT.   A presumptive positive result was obtained on the submitted specimen  and confirmed on repeat testing.  While 2019 novel coronavirus  (SARS-CoV-2) nucleic acids may be present in the submitted sample  additional confirmatory testing may be necessary for epidemiological  and / or clinical management purposes  to differentiate between  SARS-CoV-2 and other Sarbecovirus currently known to infect humans.  If clinically indicated additional testing with an alternate test  methodology [phone]) is advised. The SARS-CoV-2 RNA is generally  detectable in upper and lower respiratory sp ecimens during the acute  phase of infection. The expected result is Negative. Fact Sheet for Patients:  StrictlyIdeas.no Fact Sheet for Healthcare Providers: BankingDealers.co.za This test is not yet approved or cleared by the [place] FDA and has been authorized for detection and/or diagnosis of SARS-CoV-2 by FDA under an Emergency Use Authorization (EUA).  This EUA will remain in effect (meaning this test can be used) for the duration of the COVID-19 declaration under Section 564(b)(1) of the Act, 21 U.S.C. section 360bbb-3(b)(1), unless the authorization is terminated or revoked sooner. Performed at [facility], [address] [place],  [address]     Coagulation Studies: Recent Labs    06/06/19 0251 06/07/19 0308 06/08/19 0332  LABPROT 29.8* 31.6* 25.0*  INR 2.9* 3.1* 2.3*    Urinalysis: No results for input(s): COLORURINE, LABSPEC, PHURINE, GLUCOSEU, HGBUR, BILIRUBINUR, KETONESUR, PROTEINUR, UROBILINOGEN, NITRITE, LEUKOCYTESUR in the last 72 hours.  Invalid input(s): APPERANCEUR    Imaging: No results found.   Medications:    . amLODipine  10 mg Oral Daily  . B-complex with vitamin C  1 tablet Oral Daily  . famotidine  20 mg Oral Daily  . ferrous sulfate  325 mg Oral BID WC  . furosemide  20 mg Oral  Daily  . gabapentin  600 mg Oral QHS  . insulin aspart  0-9 Units Subcutaneous TID WC  . ipratropium-albuterol  3 mL Nebulization Q6H  . levofloxacin  500 mg Oral Daily  . loratadine  10 mg Oral Daily  . magnesium oxide  400 mg Oral Daily  . montelukast  10 mg Oral QHS  . nystatin cream   Topical BID  . oxybutynin  10 mg Oral Daily  . polyethylene glycol  17 g Oral Daily  . pravastatin  40 mg Oral Daily  . senna-docusate  1 tablet Oral BID  . sodium chloride  1 g Oral TID WC  . theophylline  100 mg Oral QODAY  . tolvaptan  15 mg Oral Q24H  . warfarin  3 mg Oral ONCE-1800  . Warfarin - Pharmacist Dosing Inpatient   Does not apply q1800   acetaminophen, bisacodyl, guaiFENesin, HYDROmorphone (DILAUDID) injection, magnesium hydroxide, menthol-cetylpyridinium **OR** phenol, metoCLOPramide **OR** metoCLOPramide (REGLAN) injection,  ondansetron **OR** ondansetron (ZOFRAN) IV, oxyCODONE, oxyCODONE, sodium phosphate, traZODone  Assessment/ Plan:  85 y.o. female with a PMHx of atrial fibrillation, bronchitis, COPD, diabetes mellitus type 2, hypertension, hyperlipidemia, pancreatitis, pneumonia, prior history of CVA, vitamin D B12 deficiency, who was admitted to [facility] on 05/31/2019 for evaluation of left hip fracture.  1.  Hyponatremia, suspect SIADH 2.  Hypertension. 3.  Anemia unspecified hemoglobin 10.1.  Plan: Serum sodium up to 126.  It appears that tolvaptan did have some effect as her urine output was 1.6 L.  Continue tolvaptan 15 mg daily for now.  Also maintain the patient on sodium chloride 1 g p.o. 3 times daily as well as furosemide 20 mg daily.  Continue to monitor serum sodium closely.   LOS: 8 Monica [name] 7/24/202012:00 PM

## 2019-06-08 NOTE — Care Management Important Message (Signed)
Important Message  Patient Details  Name: Monica [name] MRN: [id number] Date of Birth: 19-Sep-1926   Medicare Important Message Given:  Yes     Juliann Pulse A [place] Gunn 06/08/2019, 12:00 PM

## 2019-06-08 NOTE — Progress Notes (Signed)
Occupational Therapy Treatment Patient Details Name: Monica [name] MRN: [id number] DOB: [birth year] Today's Date: 06/08/2019    History of present illness Patient s/p L hip hemiarthroplasty due to hip fracture. PMH includes HTN, afib, DM, cancer (L leg, kidneys, lungs, breasts), COPD, HLD, TIAs.   OT comments  Pt noted to have difficulty with cutting lunch meal, ultimately requiring assist from therapist to cut pizza into bite size pieces after pt cut 2 pieces with significant amount of time/effort; lid and straw for coffee to minimize spillage. Educated in optimal set up to improve her independence and access of meal. Pt verbalized understanding. Left to complete meal after pt declining due to eating. Continue to recommend STR.   Follow Up Recommendations  SNF    Equipment Recommendations  3 in 1 bedside commode    Recommendations for Other Services      Precautions / Restrictions Precautions Precautions: Fall;Posterior Hip Precaution Comments: s/p hemiarthroplasty Restrictions Weight Bearing Restrictions: No LLE Weight Bearing: Weight bearing as tolerated       Mobility Bed Mobility Overal bed mobility: Needs Assistance Bed Mobility: Supine to Sit     Supine to sit: Mod assist     General bed mobility comments: Not tested; up in chair  Transfers   Equipment used: Rolling walker (2 wheeled);None Transfers: Risk manager;Sit to/from Stand Sit to Stand: Min assist Stand pivot transfers: Max assist       General transfer comment: deferred, pt eating lunch    Balance Overall balance assessment: Needs assistance Sitting-balance support: Feet supported Sitting balance-Leahy Scale: Poor   Postural control: Posterior lean Standing balance support: Bilateral upper extremity supported;During functional activity(pt does not like RW, prefers 4WW, poor balance.)                               ADL either performed or assessed with clinical judgement    ADL Overall ADL's : Needs assistance/impaired Eating/Feeding: Set up Eating/Feeding Details (indicate cue type and reason): Pt noted to have difficulty with cutting lunch meal, ultimately requiring assist from therapist to cut pizza into bite size pieces after pt cut 2 pieces with significant amount of time/effort; lid and straw for coffee to minimize spillage. Educated in optimal set up to improve her independence and access of meal. Grooming: Set up;Sitting;Brushing hair                                       Vision Baseline Vision/History: Wears glasses Wears Glasses: At all times Patient Visual Report: No change from baseline     Perception     Praxis      Cognition Arousal/Alertness: Awake/alert Behavior During Therapy: WFL for tasks assessed/performed Overall Cognitive Status: Within Functional Limits for tasks assessed                                 General Comments: Confabulating prior PT sessions and other events from yesterday.        Exercises Total Joint Exercises Ankle Circles/Pumps: AROM;Both;20 reps;Supine Short Arc Quad: Left;AROM;15 reps;Supine Heel Slides: AAROM;Left;15 reps;Supine Hip ABduction/ADduction: AAROM;Left;15 reps;Supine   Shoulder Instructions       General Comments      Pertinent Vitals/ Pain       Pain Assessment: No/denies pain  Home Living  Prior Functioning/Environment              Frequency  Min 2X/week        Progress Toward Goals  OT Goals(current goals can now be found in the care plan section)  Progress towards OT goals: Progressing toward goals  Acute Rehab OT Goals Patient Stated Goal: Pt ultimately wants to return to her senior living community at [facility] OT Goal Formulation: With patient Time For Goal Achievement: 06/20/19 Potential to Achieve Goals: Good  Plan Discharge plan remains appropriate;Frequency remains  appropriate    Co-evaluation                 AM-PAC OT "6 Clicks" Daily Activity     Outcome Measure   Help from another person eating meals?: A Little Help from another person taking care of personal grooming?: A Little Help from another person toileting, which includes using toliet, bedpan, or urinal?: A Lot Help from another person bathing (including washing, rinsing, drying)?: A Lot Help from another person to put on and taking off regular upper body clothing?: A Little Help from another person to put on and taking off regular lower body clothing?: A Lot 6 Click Score: 15    End of Session    OT Visit Diagnosis: Unsteadiness on feet (R26.81);Muscle weakness (generalized) (M62.81);History of falling (Z91.81)   Activity Tolerance Patient tolerated treatment well   Patient Left in chair;with call bell/phone within reach;with chair alarm set   Nurse Communication          Time: 1027-2536 OT Time Calculation (min): 8 min  Charges: OT General Charges $OT Visit: 1 Visit OT Treatments $Self Care/Home Management : 8-22 mins  Jeni Salles, MPH, MS, OTR/L ascom [phone] 06/08/19, 12:48 PM

## 2019-06-08 NOTE — Progress Notes (Signed)
pts bladder scan 754 ml, prime doc paged and notified. Prime doc placing orders.

## 2019-06-08 NOTE — Progress Notes (Signed)
[place] for warfarin Indication: atrial fibrillation  Allergies  Allergen Reactions  . Sulfa Antibiotics Swelling  . Celecoxib Nausea And Vomiting  . Acetaminophen Itching  . Codeine Rash  . Lyrica [Pregabalin] Rash  . Penicillin G Rash    Has patient had a PCN reaction causing immediate rash, facial/tongue/throat swelling, SOB or lightheadedness with hypotension: Yes Has patient had a PCN reaction causing severe rash involving mucus membranes or skin necrosis: No Has patient had a PCN reaction that required hospitalization: No Has patient had a PCN reaction occurring within the last 10 years: Unknown If all of the above answers are "NO", then may proceed with Cephalosporin use.  Marland Kitchen Petrolatum-Zinc Oxide Rash    Patient Measurements: Height: 5\' 4"  (162.6 cm) Weight: 157 lb (71.2 kg) IBW/kg (Calculated) : 54.7  Vital Signs: Temp: 98.6 F (37 C) (07/24 0744) Temp Source: Oral (07/24 0744) BP: 142/59 (07/24 0744) Pulse Rate: 98 (07/24 0744)  Labs: Recent Labs    06/05/19 1347 06/06/19 0251 06/06/19 1111 06/07/19 0308 06/08/19 0332  HGB  --   --   --  10.1*  --   HCT  --   --   --  30.7*  --   PLT  --   --   --  331  --   LABPROT  --  29.8*  --  31.6* 25.0*  INR  --  2.9*  --  3.1* 2.3*  CREATININE 0.72  --  0.84  --   --     Estimated Creatinine Clearance: 42.2 mL/min (by C-G formula based on SCr of 0.84 mg/dL).   Assessment: 85 year old female presented after a fall resulting in left femoral neck fracture. Patient now s/p left hip hemiarthroplasty. Patient on warfarin PTA for afib. Home dose 4 mg daily except 2 mg on Tuesdays. Pharmacy consulted to dose warfarin. Pt received vit K 2.5 mg 7/16.   Date INR Dose 7/19 1.5         5mg  7/20     2.0         4mg   7/21     2.5         2mg  7/22   2.9   1mg  7/23     3.1   1mg  7/24 2.3  Goal of Therapy:  INR 2-3 Monitor platelets by anticoagulation protocol: Yes   Plan:  INR  drop but still therapeutic. Will order Warfarin 3 mg PO x1 today. DDI: Levaquin  INR with morning labs. CBC in AM per protocol.  Chinita Greenland PharmD Clinical Pharmacist 06/08/2019

## 2019-06-08 NOTE — TOC Progression Note (Signed)
Transition of Care [facility]) - Progression Note    Patient Details  Name: JOANNY DUPREE MRN: [id number] Date of Birth: [birth year]  Transition of Care [facility]) CM/SW [place], RN Phone Number: 06/08/2019, 2:30 PM  Clinical Narrative:     Received a call from Otila Kluver from [facility] and she stated that [employer] has themsevles as primary and in fact Medicare is Primary, I went and spoke to the patient to get a call to [facility] to get is straightened out, she requested that I call her daughter, I called [name] the patient's daughter and explained that [place] has themselves as primary and someone needed to call them to explain that they are secondary, she agreed to call  Expected Discharge Plan: Skilled Nursing Facility Barriers to Discharge: Continued Medical Work up  Expected Discharge Plan and Services Expected Discharge Plan: [place] In-house Referral: Clinical Social Work Discharge Planning Services: CM Consult Post Acute Care Choice: [place] Living arrangements for the past 2 months: [place]                                       Social Determinants of Health (SDOH) Interventions    Readmission Risk Interventions Readmission Risk Prevention Plan 06/08/2019 06/08/2019  Transportation Screening Complete Complete  PCP or Specialist Appt within 3-5 Days Complete Complete  HRI or [place] - Complete  Social Work Consult for [place] Planning/Counseling Complete Complete  Palliative Care Screening Not Applicable Not Applicable  Medication Review Press photographer) Complete Complete  Some recent data might be hidden

## 2019-06-08 NOTE — Progress Notes (Signed)
Patient ID: Monica [name], female   DOB: 07-Feb-1926, 85 y.o.   MRN: [id number]  [facility] PROGRESS NOTE  Monica [name] YIR:[id number] DOB: [birth year] DOA: 05/31/2019 PCP: [name], NP  HPI/Subjective: Patient feeling okay.  Still having some pain in the hip.  Abdomen a little distended.  No bowel movement in a few days.  Objective: Vitals:   06/08/19 1118 06/08/19 1334  BP:    Pulse:    Resp:    Temp:    SpO2: 91% 92%    Filed Weights   05/31/19 0340  Weight: 71.2 kg    ROS: Review of Systems  Constitutional: Negative for chills and fever.  Eyes: Negative for blurred vision.  Respiratory: Negative for cough and shortness of breath.   Cardiovascular: Negative for chest pain.  Gastrointestinal: Negative for abdominal pain, constipation, diarrhea, nausea and vomiting.  Genitourinary: Negative for dysuria.  Musculoskeletal: Positive for joint pain.  Neurological: Negative for dizziness and headaches.   Exam: Physical Exam  Constitutional: She is oriented to person, place, and time.  HENT:  Nose: No mucosal edema.  Mouth/Throat: No oropharyngeal exudate or posterior oropharyngeal edema.  Eyes: Pupils are equal, round, and reactive to light. Conjunctivae, EOM and lids are normal.  Neck: No JVD present. Carotid bruit is not present. No edema present. No thyroid mass and no thyromegaly present.  Cardiovascular: S1 normal and S2 normal. Exam reveals no gallop.  No murmur heard. Pulses:      Dorsalis pedis pulses are 2+ on the right side and 2+ on the left side.  Respiratory: No respiratory distress. She has decreased breath sounds in the right lower field and the left lower field. She has no wheezes. She has no rhonchi. She has no rales.  GI: Soft. Bowel sounds are normal. There is no abdominal tenderness.  Musculoskeletal:     Right ankle: She exhibits swelling.     Left ankle: She exhibits swelling.  Lymphadenopathy:    She has no cervical adenopathy.   Neurological: She is alert and oriented to person, place, and time. No cranial nerve deficit.  Skin: Skin is warm. No rash noted. Nails show no clubbing.  Psychiatric: She has a normal mood and affect.      Data Reviewed: Basic Metabolic Panel: Recent Labs  Lab 06/02/19 0419 06/03/19 0522 06/05/19 1347 06/06/19 1111 06/06/19 1846 06/07/19 0308 06/08/19 0332 06/08/19 1218  NA 126* 128* 124* 123* 123* 123* 126* 127*  K 4.6 4.5 4.4 4.4  --   --   --   --   CL 93* 93* 91* 89*  --   --   --   --   CO2 25 24 25 24   --   --   --   --   GLUCOSE 161* 169* 190* 155*  --   --   --   --   BUN 23 29* 23 23  --   --   --   --   CREATININE 0.83 0.89 0.72 0.84  --   --   --   --   CALCIUM 9.0 9.0 8.6* 8.8*  --   --   --   --    CBC: Recent Labs  Lab 06/04/19 0342 06/07/19 0308  WBC 10.9* 11.2*  HGB 10.8* 10.1*  HCT 32.5* 30.7*  MCV 81.5 81.6  PLT 248 331    CBG: Recent Labs  Lab 06/07/19 1148 06/07/19 1650 06/07/19 2056 06/08/19 0742 06/08/19 1125  GLUCAP  166* 159* 165* 149* 166*    Recent Results (from the past 240 hour(s))  SARS Coronavirus 2 (CEPHEID - Performed in [facility] hospital lab), Hosp Order     Status: None   Collection Time: 05/31/19  6:35 AM   Specimen: Nasopharyngeal Swab  Result Value Ref Range Status   SARS Coronavirus 2 NEGATIVE NEGATIVE Final    Comment: (NOTE) If result is NEGATIVE SARS-CoV-2 target nucleic acids are NOT DETECTED. The SARS-CoV-2 RNA is generally detectable in upper and lower  respiratory specimens during the acute phase of infection. The lowest  concentration of SARS-CoV-2 viral copies this assay can detect is 250  copies / mL. A negative result does not preclude SARS-CoV-2 infection  and should not be used as the sole basis for treatment or other  patient management decisions.  A negative result may occur with  improper specimen collection / handling, submission of specimen other  than nasopharyngeal swab, presence of viral  mutation(s) within the  areas targeted by this assay, and inadequate number of viral copies  (<250 copies / mL). A negative result must be combined with clinical  observations, patient history, and epidemiological information. If result is POSITIVE SARS-CoV-2 target nucleic acids are DETECTED. The SARS-CoV-2 RNA is generally detectable in upper and lower  respiratory specimens dur ing the acute phase of infection.  Positive  results are indicative of active infection with SARS-CoV-2.  Clinical  correlation with patient history and other diagnostic information is  necessary to determine patient infection status.  Positive results do  not rule out bacterial infection or co-infection with other viruses. If result is PRESUMPTIVE POSTIVE SARS-CoV-2 nucleic acids MAY BE PRESENT.   A presumptive positive result was obtained on the submitted specimen  and confirmed on repeat testing.  While 2019 novel coronavirus  (SARS-CoV-2) nucleic acids may be present in the submitted sample  additional confirmatory testing may be necessary for epidemiological  and / or clinical management purposes  to differentiate between  SARS-CoV-2 and other Sarbecovirus currently known to infect humans.  If clinically indicated additional testing with an alternate test  methodology [id number]) is advised. The SARS-CoV-2 RNA is generally  detectable in upper and lower respiratory sp ecimens during the acute  phase of infection. The expected result is Negative. Fact Sheet for Patients:  StrictlyIdeas.no Fact Sheet for Healthcare Providers: BankingDealers.co.za This test is not yet approved or cleared by the [place] FDA and has been authorized for detection and/or diagnosis of SARS-CoV-2 by FDA under an Emergency Use Authorization (EUA).  This EUA will remain in effect (meaning this test can be used) for the duration of the COVID-19 declaration under Section 564(b)(1)  of the Act, 21 U.S.C. section 360bbb-3(b)(1), unless the authorization is terminated or revoked sooner. Performed at [facility], [address] [place], [place] [address]      Scheduled Meds: . amLODipine  10 mg Oral Daily  . B-complex with vitamin C  1 tablet Oral Daily  . famotidine  20 mg Oral Daily  . ferrous sulfate  325 mg Oral BID WC  . furosemide  20 mg Oral Daily  . gabapentin  600 mg Oral QHS  . insulin aspart  0-9 Units Subcutaneous TID WC  . ipratropium-albuterol  3 mL Nebulization Q6H  . levofloxacin  500 mg Oral Daily  . loratadine  10 mg Oral Daily  . magnesium oxide  400 mg Oral Daily  . montelukast  10 mg Oral QHS  . nystatin cream  Topical BID  . oxybutynin  10 mg Oral Daily  . polyethylene glycol  17 g Oral Daily  . pravastatin  40 mg Oral Daily  . senna-docusate  1 tablet Oral BID  . sodium chloride  1 g Oral TID WC  . theophylline  100 mg Oral QODAY  . tolvaptan  15 mg Oral Q24H  . warfarin  3 mg Oral ONCE-1800  . Warfarin - Pharmacist Dosing Inpatient   Does not apply q1800   Continuous Infusions:  Assessment/Plan:  1.  Hyponatremia.  Patient does take hydrochlorothiazide at home and is likely the culprit.  Sodium still too low to be discharged at 126 today.  This is the third day of tolvaptan.  Salt tablets started yesterday.  Patient also placed on Lasix.  Nephrology following. 2.  Left hip fracture requiring operative repair.  Patient will go out to rehab once sodium is better 3.  Essential hypertension on Norvasc 4.  Atrial fibrillation back on warfarin.  Pharmacy dosing. 5.  Acute hypoxic respiratory failure.  Check pulse ox on room air to try to get the patient off oxygen.. 6.  Atelectasis versus infiltrates on chest x-ray on the 20th and patient placed on Levaquin.  Can likely stop Levaquin tomorrow after the dose on the 25th. 7.  Type 2 diabetes mellitus on sliding scale insulin. 8.  Diabetic neuropathy on gabapentin  Code  Status:     Code Status Orders  (From admission, onward)         Start     Ordered   05/31/19 0506  Full code  Continuous     05/31/19 0514        Code Status History    Date Active Date Inactive Code Status Order ID Comments User Context   01/23/2018 2150 01/26/2018 1345 Full Code [id number]  Dustin Flock, MD Inpatient   08/08/2016 1200 08/11/2016 1241 Full Code [id number]  Baxter Hire, MD Inpatient   Advance Care Planning Activity     Family Communication: Spoke with daughter [name] yesterday afternoon Disposition Plan: Hopefully sodium will be high enough tomorrow in order to get out of the hospital to rehab.  Consultants:  Orthopedic surgery  Nephrology  Procedures:  Left hip repair  Antibiotics:  Levaquin  Time spent: 28 minutes  [facility]          Patient ID: Monica [name], female   DOB: Feb 29, 1926, 85 y.o.   MRN: [id number]

## 2019-06-08 NOTE — Progress Notes (Addendum)
Physical Therapy Treatment Patient Details Name: Monica [name] MRN: [id number] DOB: [birth year] Today's Date: 06/08/2019    History of Present Illness Patient s/p L hip hemiarthroplasty due to hip fracture. PMH includes HTN, afib, DM, cancer (L leg, kidneys, lungs, breasts), COPD, HLD, TIAs.    PT Comments    Pt in bed upon entry, awake and speaking to daughter on phone. Pt is aggreeable to PT treatment, although she reports to have already had PT this morning, which is not accurate. Pt able to participate in entire session without pain limitations. Frequent multimodal cues are required for accuracy and continued.attending to task. Pt able to STS from EOB with minA and RW, but ultimately is not able to establish independent balance nor able to take steps for transfer to chair. Dependent style transfer with MaxA for SPT to chair. Unable to ween to room air, but satting WNL on 1L. Pt motivated, participatory, and interactive throughout. PT treatment frequency will be updated from BID to QD.    Follow Up Recommendations  SNF     Equipment Recommendations  Rolling walker with 5" wheels    Recommendations for Other Services       Precautions / Restrictions Precautions Precautions: Fall;Posterior Hip Precaution Comments: s/p hemiarthroplasty Restrictions Weight Bearing Restrictions: No LLE Weight Bearing: Weight bearing as tolerated    Mobility  Bed Mobility Overal bed mobility: Needs Assistance Bed Mobility: Supine to Sit     Supine to sit: Mod assist     General bed mobility comments: Not tested; up in chair  Transfers   Equipment used: Rolling walker (2 wheeled);None Transfers: Risk manager;Sit to/from Stand Sit to Stand: Min assist Stand pivot transfers: Max assist       General transfer comment: deferred, pt eating lunch  Ambulation/Gait Ambulation/Gait assistance: (unable at this time)               Environmental education officer [place] (Stroke Patients Only)       Balance Overall balance assessment: Needs assistance Sitting-balance support: Feet supported Sitting balance-Leahy Scale: Poor   Postural control: Posterior lean Standing balance support: Bilateral upper extremity supported;During functional activity(pt does not like RW, prefers 4WW, poor balance.)                                Cognition Arousal/Alertness: Awake/alert Behavior During Therapy: WFL for tasks assessed/performed Overall Cognitive Status: Within Functional Limits for tasks assessed                                 General Comments: Confabulating prior PT sessions and other events from yesterday.      Exercises Total Joint Exercises Ankle Circles/Pumps: AROM;Both;20 reps;Supine Short Arc Quad: Left;AROM;15 reps;Supine Heel Slides: AAROM;Left;15 reps;Supine Hip ABduction/ADduction: AAROM;Left;15 reps;Supine    General Comments        Pertinent Vitals/Pain Pain Assessment: No/denies pain    Home Living                      Prior Function            PT Goals (current goals can now be found in the care plan section) Acute Rehab PT Goals Patient Stated Goal: Pt ultimately wants to return to her senior living community  at [facility] PT Goal Formulation: With patient Time For Goal Achievement: 06/19/19 Potential to Achieve Goals: Good Progress towards PT goals: Progressing toward goals    Frequency    7X/week      PT Plan Current plan remains appropriate;Frequency needs to be updated    Co-evaluation              AM-PAC PT "6 Clicks" Mobility   Outcome Measure  Help needed turning from your back to your side while in a flat bed without using bedrails?: A Lot Help needed moving from lying on your back to sitting on the side of a flat bed without using bedrails?: A Lot Help needed moving to and from a bed to a chair (including a wheelchair)?: Total Help  needed standing up from a chair using your arms (e.g., wheelchair or bedside chair)?: A Lot Help needed to walk in hospital room?: Total Help needed climbing 3-5 steps with a railing? : Total 6 Click Score: 9    End of Session Equipment Utilized During Treatment: Oxygen;Gait belt Activity Tolerance: Patient tolerated treatment well;No increased pain;Patient limited by fatigue Patient left: in chair;with call bell/phone within reach;with chair alarm set Nurse Communication: Mobility status PT Visit Diagnosis: Unsteadiness on feet (R26.81);Other abnormalities of gait and mobility (R26.89);Muscle weakness (generalized) (M62.81);Pain;Difficulty in walking, not elsewhere classified (R26.2) Pain - Right/Left: Left Pain - part of body: Hip     Time: 5093-2671 PT Time Calculation (min) (ACUTE ONLY): 30 min  Charges:  $Therapeutic Exercise: 23-37 mins                     2:38 PM, 06/08/19 Etta Grandchild, PT, DPT Physical Therapist - [facility]  [phone] ([place])    [place] C 06/08/2019, 2:38 PM

## 2019-06-08 NOTE — Progress Notes (Signed)
Subjective: 5 Days Post-Op Procedure(s) (LRB): ARTHROPLASTY BIPOLAR HIP (HEMIARTHROPLASTY) (Left) Patient reports pain as improving.  She feels better today. Patient is well, and has had no acute complaints or problems Progressing very slowly with therapy. Plan is to go Rehab after hospital stay. no nausea and no vomiting Patient denies any chest pains or shortness of breath.  Objective: Vital signs in last 24 hours: Temp:  [97.9 F (36.6 C)-98.4 F (36.9 C)] 97.9 F (36.6 C) (07/23 2322) Pulse Rate:  [91-104] 97 (07/23 2322) Resp:  [15-18] 15 (07/23 2322) BP: (128-151)/(58-61) 128/61 (07/23 2322) SpO2:  [92 %-100 %] 98 % (07/24 0203) well approximated incision Heels are non tender and elevated off the bed using rolled towels Intake/Output from previous day: 07/23 0701 - 07/24 0700 In: 850 [P.O.:850] Out: 1600 [Urine:1600] Intake/Output this shift: Total I/O In: 370 [P.O.:370] Out: 700 [Urine:700]  Recent Labs    06/07/19 0308  HGB 10.1*   Recent Labs    06/07/19 0308  WBC 11.2*  RBC 3.76*  HCT 30.7*  PLT 331   Recent Labs    06/05/19 1347 06/06/19 1111  06/07/19 0308 06/08/19 0332  NA 124* 123*   < > 123* 126*  K 4.4 4.4  --   --   --   CL 91* 89*  --   --   --   CO2 25 24  --   --   --   BUN 23 23  --   --   --   CREATININE 0.72 0.84  --   --   --   GLUCOSE 190* 155*  --   --   --   CALCIUM 8.6* 8.8*  --   --   --    < > = values in this interval not displayed.   Recent Labs    06/07/19 0308 06/08/19 0332  INR 3.1* 2.3*    EXAM General - Patient is Alert and Confused Extremity - Neurologically intact Neurovascular intact Sensation intact distally Intact pulses distally Dorsiflexion/Plantar flexion intact No cellulitis present Compartment soft Dressing - Saturated with serosanguineous drainage secondary to being on Coumadin. Motor Function - intact, moving foot and toes well on exam.    Past Medical History:  Diagnosis Date  .  Atrial fibrillation ([place])   . Bronchitis   . Cancer ([place])    Left leg growth, kidneys, lungs and breasts  . Carcinoma of unknown primary ([place])   . COPD (chronic obstructive pulmonary disease) ([place])   . Diabetes mellitus without complication ([place])   . Hyperlipidemia   . Hypertension   . Pancreatitis   . Pneumonia   . Stroke ([place])    TIA's  . Vitamin B12 deficiency     Assessment/Plan: 5 Days Post-Op Procedure(s) (LRB): ARTHROPLASTY BIPOLAR HIP (HEMIARTHROPLASTY) (Left) Active Problems:   'light-for-dates' infant with signs of fetal malnutrition   Closed left hip fracture (HCC)   Pressure injury of skin  Estimated body mass index is 26.95 kg/m as calculated from the following:   Height as of this encounter: 5\' 4"  (1.626 m).   Weight as of this encounter: 71.2 kg. Up with therapy Discharge to SNF when medically stable  Labs: INR today 3.1.  Sodium level up to 126 from 123. DVT Prophylaxis - Coumadin, Foot Pumps and TED hose Weight-Bearing as tolerated to left leg Please change dressing today and apply a new honeycomb dressing Patient will need to follow-up in clinical clinic in 2 weeks Continue Coumadin Patient  needs bowel movement  Reche Dixon, PA-C [place] 06/08/2019, 6:23 AM

## 2019-06-09 ENCOUNTER — Inpatient Hospital Stay: Payer: Medicare Other

## 2019-06-09 DIAGNOSIS — I10 Essential (primary) hypertension: Secondary | ICD-10-CM | POA: Diagnosis not present

## 2019-06-09 DIAGNOSIS — M2550 Pain in unspecified joint: Secondary | ICD-10-CM | POA: Diagnosis not present

## 2019-06-09 DIAGNOSIS — D649 Anemia, unspecified: Secondary | ICD-10-CM | POA: Diagnosis not present

## 2019-06-09 DIAGNOSIS — K210 Gastro-esophageal reflux disease with esophagitis: Secondary | ICD-10-CM | POA: Diagnosis not present

## 2019-06-09 DIAGNOSIS — K219 Gastro-esophageal reflux disease without esophagitis: Secondary | ICD-10-CM | POA: Diagnosis not present

## 2019-06-09 DIAGNOSIS — R21 Rash and other nonspecific skin eruption: Secondary | ICD-10-CM | POA: Diagnosis not present

## 2019-06-09 DIAGNOSIS — S72002A Fracture of unspecified part of neck of left femur, initial encounter for closed fracture: Secondary | ICD-10-CM | POA: Diagnosis not present

## 2019-06-09 DIAGNOSIS — L89313 Pressure ulcer of right buttock, stage 3: Secondary | ICD-10-CM | POA: Diagnosis not present

## 2019-06-09 DIAGNOSIS — L89323 Pressure ulcer of left buttock, stage 3: Secondary | ICD-10-CM | POA: Diagnosis not present

## 2019-06-09 DIAGNOSIS — Z7401 Bed confinement status: Secondary | ICD-10-CM | POA: Diagnosis not present

## 2019-06-09 DIAGNOSIS — W19XXXA Unspecified fall, initial encounter: Secondary | ICD-10-CM | POA: Diagnosis not present

## 2019-06-09 DIAGNOSIS — E785 Hyperlipidemia, unspecified: Secondary | ICD-10-CM | POA: Diagnosis not present

## 2019-06-09 DIAGNOSIS — I959 Hypotension, unspecified: Secondary | ICD-10-CM | POA: Diagnosis not present

## 2019-06-09 DIAGNOSIS — J45901 Unspecified asthma with (acute) exacerbation: Secondary | ICD-10-CM | POA: Diagnosis not present

## 2019-06-09 DIAGNOSIS — Z872 Personal history of diseases of the skin and subcutaneous tissue: Secondary | ICD-10-CM | POA: Diagnosis not present

## 2019-06-09 DIAGNOSIS — T7840XS Allergy, unspecified, sequela: Secondary | ICD-10-CM | POA: Diagnosis not present

## 2019-06-09 DIAGNOSIS — I480 Paroxysmal atrial fibrillation: Secondary | ICD-10-CM | POA: Diagnosis not present

## 2019-06-09 DIAGNOSIS — S99922A Unspecified injury of left foot, initial encounter: Secondary | ICD-10-CM | POA: Diagnosis not present

## 2019-06-09 DIAGNOSIS — E1140 Type 2 diabetes mellitus with diabetic neuropathy, unspecified: Secondary | ICD-10-CM | POA: Diagnosis not present

## 2019-06-09 DIAGNOSIS — M6281 Muscle weakness (generalized): Secondary | ICD-10-CM | POA: Diagnosis not present

## 2019-06-09 DIAGNOSIS — S72002D Fracture of unspecified part of neck of left femur, subsequent encounter for closed fracture with routine healing: Secondary | ICD-10-CM | POA: Diagnosis not present

## 2019-06-09 DIAGNOSIS — E871 Hypo-osmolality and hyponatremia: Secondary | ICD-10-CM | POA: Diagnosis not present

## 2019-06-09 DIAGNOSIS — R52 Pain, unspecified: Secondary | ICD-10-CM | POA: Diagnosis not present

## 2019-06-09 DIAGNOSIS — N398 Other specified disorders of urinary system: Secondary | ICD-10-CM | POA: Diagnosis not present

## 2019-06-09 DIAGNOSIS — Z4789 Encounter for other orthopedic aftercare: Secondary | ICD-10-CM | POA: Diagnosis not present

## 2019-06-09 DIAGNOSIS — E1142 Type 2 diabetes mellitus with diabetic polyneuropathy: Secondary | ICD-10-CM | POA: Diagnosis not present

## 2019-06-09 DIAGNOSIS — E119 Type 2 diabetes mellitus without complications: Secondary | ICD-10-CM | POA: Diagnosis not present

## 2019-06-09 DIAGNOSIS — I4891 Unspecified atrial fibrillation: Secondary | ICD-10-CM | POA: Diagnosis not present

## 2019-06-09 DIAGNOSIS — E1159 Type 2 diabetes mellitus with other circulatory complications: Secondary | ICD-10-CM | POA: Diagnosis not present

## 2019-06-09 DIAGNOSIS — K5900 Constipation, unspecified: Secondary | ICD-10-CM | POA: Diagnosis not present

## 2019-06-09 DIAGNOSIS — J45909 Unspecified asthma, uncomplicated: Secondary | ICD-10-CM | POA: Diagnosis not present

## 2019-06-09 DIAGNOSIS — E1165 Type 2 diabetes mellitus with hyperglycemia: Secondary | ICD-10-CM | POA: Diagnosis not present

## 2019-06-09 LAB — PROTIME-INR
INR: 1.9 — ABNORMAL HIGH (ref 0.8–1.2)
Prothrombin Time: 21.1 seconds — ABNORMAL HIGH (ref 11.4–15.2)

## 2019-06-09 LAB — SODIUM
Sodium: 129 mmol/L — ABNORMAL LOW (ref 135–145)
Sodium: 130 mmol/L — ABNORMAL LOW (ref 135–145)

## 2019-06-09 LAB — GLUCOSE, CAPILLARY
Glucose-Capillary: 149 mg/dL — ABNORMAL HIGH (ref 70–99)
Glucose-Capillary: 154 mg/dL — ABNORMAL HIGH (ref 70–99)
Glucose-Capillary: 161 mg/dL — ABNORMAL HIGH (ref 70–99)

## 2019-06-09 LAB — SARS CORONAVIRUS 2 BY RT PCR (HOSPITAL ORDER, PERFORMED IN ~~LOC~~ HOSPITAL LAB): SARS Coronavirus 2: NEGATIVE

## 2019-06-09 LAB — CORTISOL: Cortisol, Plasma: 17 ug/dL

## 2019-06-09 MED ORDER — NYSTATIN 100000 UNIT/GM EX CREA: TOPICAL_CREAM | Freq: Two times a day (BID) | CUTANEOUS | 0 refills | Status: DC

## 2019-06-09 MED ORDER — WARFARIN SODIUM 3 MG PO TABS
3.0000 mg | ORAL_TABLET | Freq: Once | ORAL | Status: AC
  Administered 2019-06-09: 3 mg via ORAL
  Filled 2019-06-09: qty 1

## 2019-06-09 MED ORDER — AMLODIPINE BESYLATE 10 MG PO TABS: 10.0000 mg | ORAL_TABLET | Freq: Every day | ORAL | Status: DC

## 2019-06-09 MED ORDER — DIPHENHYDRAMINE HCL 25 MG PO CAPS
25.0000 mg | ORAL_CAPSULE | Freq: Three times a day (TID) | ORAL | Status: DC | PRN
  Administered 2019-06-09: 25 mg via ORAL
  Filled 2019-06-09: qty 1

## 2019-06-09 MED ORDER — SODIUM CHLORIDE 1 G PO TABS: 1.0000 g | ORAL_TABLET | Freq: Three times a day (TID) | ORAL | Status: DC

## 2019-06-09 NOTE — Discharge Summary (Signed)
[facility] - [place] at [facility], 85 y.o., DOB 1926/07/06, MRN [id number]. Admission date: 05/31/2019 Discharge Date 06/09/2019 Primary MD [name], NP Admitting Physician Christel Mormon, MD  Admission Diagnosis  Left displaced femoral neck fracture ([place]) [S72.002A] Closed left hip fracture [facility]) [S72.002A]  Discharge Diagnosis   Active Problems: Left hip fracture status post repair Severe hyponatremia Essential hypertension Atrial fibrillation Acute hypoxic respiratory failure wean oxygen as tolerated Type 2 diabetes Diabetic neuropathy   Hospital Course  Monica [name]  is a 85 y.o. Caucasian female with a known history of multiple medical problems that will be mentioned below, who presented emergency room with onset of accidental mechanical fall with subsequent left hip pain.  The patient was apparently reaching up to get something and while bending down she lost her balance.  Patient was noted to have a hip fracture and admitted for that.  She underwent repair of the hip fracture.  Postop she was noted to have hyponatremia.  This was felt to be due to diuretic therapy with HCTZ.  Her sodium continue to be persistently low.  Therefore she was seen by nephrology.  Patient was started on trial dropped down her sodium has steadily improved.  Patient to continue 1200 cc of fluid restriction Check a BMP and 4 days.  Wean oxygen as tolerated            Consults  orthopedic surgery, nephrology  Significant Tests:  See full reports for all details     Dg Chest 2 View  Result Date: 06/04/2019 CLINICAL DATA:  Shortness of breath EXAM: CHEST - 2 VIEW COMPARISON:  03/22/2017 FINDINGS: Low lung volumes. Mild elevation of the right hemidiaphragm. Bibasilar opacities, favor atelectasis, but pneumonia not excluded, particularly in the left base. Suspect small left effusion. Heart is borderline in size. Mild vascular congestion. IMPRESSION: Borderline  heart size with mild vascular congestion. Bibasilar opacities could reflect atelectasis or infiltrates. Small left effusion. Electronically Signed   By: Rolm Baptise M.D.   On: 06/04/2019 09:29   Dg Lumbar Spine Complete  Result Date: 05/31/2019 CLINICAL DATA:  Back pain after fall EXAM: LUMBAR SPINE - COMPLETE 4+ VIEW COMPARISON:  12/12/2013 abdominal CT FINDINGS: Negative for acute fracture or traumatic malalignment. Generalized facet degeneration with slight L4-5 anterolisthesis. Mild lumbar levocurvature. Generalized osteopenia. IMPRESSION: No acute finding. Electronically Signed   By: Monte Fantasia M.D.   On: 05/31/2019 04:24   Dg Hip Port Unilat With Pelvis 1v Left  Result Date: 06/03/2019 CLINICAL DATA:  Left hip replacement for a left femoral neck fracture. EXAM: DG HIP (WITH OR WITHOUT PELVIS) 1V PORT LEFT COMPARISON:  05/31/2019. FINDINGS: Interval left hip prosthesis in satisfactory position and alignment. No fracture or dislocation seen. IMPRESSION: Satisfactory postoperative appearance of a left hip prosthesis. Electronically Signed   By: Claudie Revering M.D.   On: 06/03/2019 13:01   Dg Hip Unilat W Or Wo Pelvis 2-3 Views Left  Result Date: 05/31/2019 CLINICAL DATA:  Fall with left hip pain EXAM: DG HIP (WITH OR WITHOUT PELVIS) 2-3V LEFT COMPARISON:  None. FINDINGS: Subcapital left femoral neck fracture with dorsal and lateral impaction. No dislocation. Generalized osteopenia.  No notable acetabular spurring. IMPRESSION: Impacted left subcapital femoral neck fracture. Electronically Signed   By: Monte Fantasia M.D.   On: 05/31/2019 04:21       Today   Subjective:   Monica [name] patient feeling better complains of some pain in the left foot  Objective:   Blood pressure (!) 129/58, pulse 98, temperature 98.2 F (36.8 C), resp. rate 18, height 5\' 4"  (1.626 m), weight 71.2 kg, SpO2 100 %.  .  Intake/Output Summary (Last 24 hours) at 06/09/2019 0946 Last data filed at 06/08/2019  2113 Gross per 24 hour  Intake 240 ml  Output 1350 ml  Net -1110 ml    Exam VITAL SIGNS: Blood pressure (!) 129/58, pulse 98, temperature 98.2 F (36.8 C), resp. rate 18, height 5\' 4"  (1.626 m), weight 71.2 kg, SpO2 100 %.  GENERAL:  85 y.o.-year-old patient lying in the bed with no acute distress.  EYES: Pupils equal, round, reactive to light and accommodation. No scleral icterus. Extraocular muscles intact.  HEENT: Head atraumatic, normocephalic. Oropharynx and nasopharynx clear.  NECK:  Supple, no jugular venous distention. No thyroid enlargement, no tenderness.  LUNGS: Normal breath sounds bilaterally, no wheezing, rales,rhonchi or crepitation. No use of accessory muscles of respiration.  CARDIOVASCULAR: S1, S2 normal. No murmurs, rubs, or gallops.  ABDOMEN: Soft, nontender, nondistended. Bowel sounds present. No organomegaly or mass.  EXTREMITIES: No pedal edema, cyanosis, or clubbing.  NEUROLOGIC: Cranial nerves II through XII are intact. Muscle strength 5/5 in all extremities. Sensation intact. Gait not checked.  PSYCHIATRIC: The patient is alert and oriented x 3.  SKIN: No obvious rash, lesion, or ulcer.   Data Review     CBC w Diff:  Lab Results  Component Value Date   WBC 11.2 (H) 06/07/2019   HGB 10.1 (L) 06/07/2019   HGB 12.5 03/29/2014   HCT 30.7 (L) 06/07/2019   HCT 38.3 03/29/2014   PLT 331 06/07/2019   PLT 258 03/29/2014   LYMPHOPCT 10 12/03/2016   LYMPHOPCT 10.2 03/29/2014   MONOPCT 6 12/03/2016   MONOPCT 4.8 03/29/2014   EOSPCT 0 12/03/2016   EOSPCT 0.1 03/29/2014   BASOPCT 0 12/03/2016   BASOPCT 0.2 03/29/2014   CMP:  Lab Results  Component Value Date   NA 129 (L) 06/09/2019   NA 136 03/29/2014   K 4.4 06/06/2019   K 4.0 03/29/2014   CL 89 (L) 06/06/2019   CL 101 03/29/2014   CO2 24 06/06/2019   CO2 27 03/29/2014   BUN 23 06/06/2019   BUN 28 (H) 03/29/2014   CREATININE 0.84 06/06/2019   CREATININE 1.29 03/29/2014   PROT 6.8 05/31/2019    PROT 7.6 12/12/2013   ALBUMIN 3.9 05/31/2019   ALBUMIN 3.3 (L) 12/12/2013   BILITOT 1.1 05/31/2019   BILITOT 0.4 12/12/2013   ALKPHOS 103 05/31/2019   ALKPHOS 71 12/12/2013   AST 31 05/31/2019   AST 23 12/12/2013   ALT 21 05/31/2019   ALT 22 12/12/2013  .  Micro Results Recent Results (from the past 240 hour(s))  SARS Coronavirus 2 (CEPHEID - Performed in [place] hospital lab), Hosp Order     Status: None   Collection Time: 05/31/19  6:35 AM   Specimen: Nasopharyngeal Swab  Result Value Ref Range Status   SARS Coronavirus 2 NEGATIVE NEGATIVE Final    Comment: (NOTE) If result is NEGATIVE SARS-CoV-2 target nucleic acids are NOT DETECTED. The SARS-CoV-2 RNA is generally detectable in upper and lower  respiratory specimens during the acute phase of infection. The lowest  concentration of SARS-CoV-2 viral copies this assay can detect is 250  copies / mL. A negative result does not preclude SARS-CoV-2 infection  and should not be used as the sole basis for treatment or other  patient management decisions.  A negative result may occur with  improper specimen collection / handling, submission of specimen other  than nasopharyngeal swab, presence of viral mutation(s) within the  areas targeted by this assay, and inadequate number of viral copies  (<250 copies / mL). A negative result must be combined with clinical  observations, patient history, and epidemiological information. If result is POSITIVE SARS-CoV-2 target nucleic acids are DETECTED. The SARS-CoV-2 RNA is generally detectable in upper and lower  respiratory specimens dur ing the acute phase of infection.  Positive  results are indicative of active infection with SARS-CoV-2.  Clinical  correlation with patient history and other diagnostic information is  necessary to determine patient infection status.  Positive results do  not rule out bacterial infection or co-infection with other viruses. If result is PRESUMPTIVE  POSTIVE SARS-CoV-2 nucleic acids MAY BE PRESENT.   A presumptive positive result was obtained on the submitted specimen  and confirmed on repeat testing.  While 2019 novel coronavirus  (SARS-CoV-2) nucleic acids may be present in the submitted sample  additional confirmatory testing may be necessary for epidemiological  and / or clinical management purposes  to differentiate between  SARS-CoV-2 and other Sarbecovirus currently known to infect humans.  If clinically indicated additional testing with an alternate test  methodology [phone]) is advised. The SARS-CoV-2 RNA is generally  detectable in upper and lower respiratory sp ecimens during the acute  phase of infection. The expected result is Negative. Fact Sheet for Patients:  StrictlyIdeas.no Fact Sheet for Healthcare Providers: BankingDealers.co.za This test is not yet approved or cleared by the [place] FDA and has been authorized for detection and/or diagnosis of SARS-CoV-2 by FDA under an Emergency Use Authorization (EUA).  This EUA will remain in effect (meaning this test can be used) for the duration of the COVID-19 declaration under Section 564(b)(1) of the Act, 21 U.S.C. section 360bbb-3(b)(1), unless the authorization is terminated or revoked sooner. Performed at [facility], [address] [place], [place] [address]   Novel Coronavirus, NAA (hospital order; send-out to ref lab)     Status: None   Collection Time: 06/05/19  4:08 PM   Specimen: Nasopharyngeal Swab; Respiratory  Result Value Ref Range Status   SARS-CoV-2, NAA NOT DETECTED NOT DETECTED Final    Comment: (NOTE) This test was developed and its performance characteristics determined by [employer]. This test has not been FDA cleared or approved. This test has been authorized by FDA under an Emergency Use Authorization (EUA). This test is only authorized for the duration of time the  declaration that circumstances exist justifying the authorization of the emergency use of in vitro diagnostic tests for detection of SARS-CoV-2 virus and/or diagnosis of COVID-19 infection under section 564(b)(1) of the Act, 21 U.S.C. 633HLK-5(G)(2), unless the authorization is terminated or revoked sooner. When diagnostic testing is negative, the possibility of a false negative result should be considered in the context of a patient's recent exposures and the presence of clinical signs and symptoms consistent with COVID-19. An individual without symptoms of COVID-19 and who is not shedding SARS-CoV-2 virus would expect to have a negative (not detected) result in this assay. Performed  At: [facility] [place], [place] [id number] Rush Farmer MD KA:[id number]    [place]  Final    Comment: Performed at [facility], [address] [place], [place] [address]        Code Status Orders  (From admission, onward)  Start     Ordered   05/31/19 0506  Full code  Continuous     05/31/19 0514        Code Status History    Date Active Date Inactive Code Status Order ID Comments User Context   01/23/2018 2150 01/26/2018 1345 Full Code [id number]  Dustin Flock, MD Inpatient   08/08/2016 1200 08/11/2016 1241 Full Code [id number]  Baxter Hire, MD Inpatient   Advance Care Planning Activity           Contact information for follow-up providers    Hooten, Laurice Record, MD In 6 weeks.   Specialty: Orthopedic Surgery Why: For x-rays...Marland KitchenMarland KitchenMarland Kitchen Patient will need to make any follow up appointments. Contact information: [place] [address] [phone]            Contact information for after-discharge care    Destination    [facility] Preferred SNF .   Service: Skilled Nursing Contact information: [address] [place] [phone]                  Discharge Medications   Allergies as of 06/09/2019      Reactions   Sulfa Antibiotics Swelling   Celecoxib Nausea And Vomiting   Acetaminophen Itching   Codeine Rash   Lyrica [pregabalin] Rash   Penicillin G Rash   Has patient had a PCN reaction causing immediate rash, facial/tongue/throat swelling, SOB or lightheadedness with hypotension: Yes Has patient had a PCN reaction causing severe rash involving mucus membranes or skin necrosis: No Has patient had a PCN reaction that required hospitalization: No Has patient had a PCN reaction occurring within the last 10 years: Unknown If all of the above answers are "NO", then may proceed with Cephalosporin use.   Petrolatum-zinc Oxide Rash      Medication List    STOP taking these medications   amLODipine-benazepril 5-20 MG capsule Commonly known as: LOTREL   hydrochlorothiazide 25 MG tablet Commonly known as: HYDRODIURIL     TAKE these medications   albuterol 108 (90 Base) MCG/ACT inhaler Commonly known as: VENTOLIN HFA Inhale 2 puffs into the lungs every 4 (four) hours as needed for wheezing or shortness of breath.   amLODipine 10 MG tablet Commonly known as: NORVASC Take 1 tablet (10 mg total) by mouth daily. Start taking on: June 10, 2019   Anoro Ellipta 62.5-25 MCG/INH Aepb Generic drug: umeclidinium-vilanterol Inhale 1 puff into the lungs daily.   b complex vitamins capsule Take 1 capsule by mouth daily.   desloratadine 5 MG tablet Commonly known as: CLARINEX Take 1 tablet (5 mg total) by mouth daily.   famotidine 20 MG tablet Commonly known as: PEPCID Take 1 tablet (20 mg total) by mouth 2 (two) times daily.   fluticasone furoate-vilanterol 100-25 MCG/INH Aepb Commonly known as: Breo Ellipta TAKE 1 PUFF ONCE A DAY   furosemide 20 MG tablet Commonly known as: LASIX Take 20 mg by mouth daily.   gabapentin 300 MG capsule Commonly known as: NEURONTIN Take 2  capsules (600 mg total) by mouth at bedtime.   glipiZIDE 2.5 MG 24 hr tablet Commonly known as: GLUCOTROL XL Take 1 tablet (2.5 mg total) by mouth daily.   Magnesium 200 MG Tabs Take 1 tablet (200 mg total) by mouth daily.   metFORMIN 500 MG 24 hr tablet Commonly known as: GLUCOPHAGE-XR Take 2 tablets (1,000 mg total) by mouth daily.  montelukast 10 MG tablet Commonly known as: SINGULAIR Take 10 mg by mouth at bedtime.   nystatin cream Commonly known as: MYCOSTATIN Apply topically 2 (two) times daily.   ONE TOUCH ULTRA TEST test strip Generic drug: glucose blood TEST ONCE A DAY AS DIRECTED   oxybutynin 10 MG 24 hr tablet Commonly known as: DITROPAN-XL Take 1 tablet (10 mg total) by mouth daily.   oxyCODONE 5 MG immediate release tablet Commonly known as: Oxy IR/ROXICODONE Take 1 tablet (5 mg total) by mouth every 4 (four) hours as needed for moderate pain.   pravastatin 40 MG tablet Commonly known as: PRAVACHOL Take 1 tablet (40 mg total) by mouth daily.   SALONPAS PAIN RELIEF PATCH EX Apply topically.   sodium chloride 1 g tablet Take 1 tablet (1 g total) by mouth 3 (three) times daily with meals.   theophylline 100 MG 24 hr capsule Commonly known as: Theo-24 TAKE 1 CAPSULE BY MOUTH EVERY DAY   triamcinolone 0.025 % cream Commonly known as: KENALOG Apply 1 application topically 2 (two) times daily. Ok to mix with topical lotion and apply to all affected areas.   warfarin 4 MG tablet Commonly known as: COUMADIN TAKE ONE TABLET EVERY DAY EXCEPT MONDAY TAKE 1/2 TABLET          Total Time in preparing paper work, data evaluation and todays exam - 20 minutes  Dustin Flock M.D on 06/09/2019 at 9:46 AM [facility]   Office  [phone]

## 2019-06-09 NOTE — Progress Notes (Signed)
[facility]  ROUNDING NOTE   Subjective:  Serum sodium up to 129 at the moment. Has tolerated therapy with tolvaptan quite well.   Objective:  Vital signs in last 24 hours:  Temp:  [98 F (36.7 C)-98.5 F (36.9 C)] 98 F (36.7 C) (07/25 1140) Pulse Rate:  [98-108] 101 (07/25 1140) Resp:  [18] 18 (07/25 1140) BP: (128-142)/(55-59) 129/57 (07/25 1140) SpO2:  [90 %-100 %] 95 % (07/25 1140)  Weight change:  Filed Weights   05/31/19 0340  Weight: 71.2 kg    Intake/Output: I/O last 3 completed shifts: In: 670 [P.O.:670] Out: 2050 [Urine:2050]   Intake/Output this shift:  No intake/output data recorded.  Physical Exam: General: No acute distress  Head: Normocephalic, atraumatic. Moist oral mucosal membranes  Eyes: Anicteric  Neck: Supple, trachea midline  Lungs:  Clear to auscultation, normal effort  Heart: S1S2 no rubs  Abdomen:  Soft, nontender, bowel sounds present  Extremities: No peripheral edema.  Neurologic: Awake, alert, following commands  Skin: No lesions       Basic Metabolic Panel: Recent Labs  Lab 06/03/19 0522 06/05/19 1347 06/06/19 1111  06/07/19 0308 06/08/19 0332 06/08/19 1218 06/08/19 2002 06/09/19 0414  NA 128* 124* 123*   < > 123* 126* 127* 128* 129*  K 4.5 4.4 4.4  --   --   --   --   --   --   CL 93* 91* 89*  --   --   --   --   --   --   CO2 24 25 24   --   --   --   --   --   --   GLUCOSE 169* 190* 155*  --   --   --   --   --   --   BUN 29* 23 23  --   --   --   --   --   --   CREATININE 0.89 0.72 0.84  --   --   --   --   --   --   CALCIUM 9.0 8.6* 8.8*  --   --   --   --   --   --    < > = values in this interval not displayed.    Liver Function Tests: No results for input(s): AST, ALT, ALKPHOS, BILITOT, PROT, ALBUMIN in the last 168 hours. No results for input(s): LIPASE, AMYLASE in the last 168 hours. No results for input(s): AMMONIA in the last 168 hours.  CBC: Recent Labs  Lab 06/04/19 0342  06/07/19 0308  WBC 10.9* 11.2*  HGB 10.8* 10.1*  HCT 32.5* 30.7*  MCV 81.5 81.6  PLT 248 331    Cardiac Enzymes: No results for input(s): CKTOTAL, CKMB, CKMBINDEX, TROPONINI in the last 168 hours.  BNP: Invalid input(s): POCBNP  CBG: Recent Labs  Lab 06/08/19 1125 06/08/19 1635 06/08/19 2105 06/09/19 0820 06/09/19 1137  GLUCAP 166* 154* 157* 149* 154*    Microbiology: Results for orders placed or performed during the hospital encounter of 05/31/19  SARS Coronavirus 2 (CEPHEID - Performed in [place] hospital lab), Hosp Order     Status: None   Collection Time: 05/31/19  6:35 AM   Specimen: Nasopharyngeal Swab  Result Value Ref Range Status   SARS Coronavirus 2 NEGATIVE NEGATIVE Final    Comment: (NOTE) If result is NEGATIVE SARS-CoV-2 target nucleic acids are NOT DETECTED. The SARS-CoV-2 RNA is generally detectable in upper and lower  respiratory specimens during the acute phase of infection. The lowest  concentration of SARS-CoV-2 viral copies this assay can detect is 250  copies / mL. A negative result does not preclude SARS-CoV-2 infection  and should not be used as the sole basis for treatment or other  patient management decisions.  A negative result may occur with  improper specimen collection / handling, submission of specimen other  than nasopharyngeal swab, presence of viral mutation(s) within the  areas targeted by this assay, and inadequate number of viral copies  (<250 copies / mL). A negative result must be combined with clinical  observations, patient history, and epidemiological information. If result is POSITIVE SARS-CoV-2 target nucleic acids are DETECTED. The SARS-CoV-2 RNA is generally detectable in upper and lower  respiratory specimens dur ing the acute phase of infection.  Positive  results are indicative of active infection with SARS-CoV-2.  Clinical  correlation with patient history and other diagnostic information is  necessary to  determine patient infection status.  Positive results do  not rule out bacterial infection or co-infection with other viruses. If result is PRESUMPTIVE POSTIVE SARS-CoV-2 nucleic acids MAY BE PRESENT.   A presumptive positive result was obtained on the submitted specimen  and confirmed on repeat testing.  While 2019 novel coronavirus  (SARS-CoV-2) nucleic acids may be present in the submitted sample  additional confirmatory testing may be necessary for epidemiological  and / or clinical management purposes  to differentiate between  SARS-CoV-2 and other Sarbecovirus currently known to infect humans.  If clinically indicated additional testing with an alternate test  methodology [phone]) is advised. The SARS-CoV-2 RNA is generally  detectable in upper and lower respiratory sp ecimens during the acute  phase of infection. The expected result is Negative. Fact Sheet for Patients:  StrictlyIdeas.no Fact Sheet for Healthcare Providers: BankingDealers.co.za This test is not yet approved or cleared by the [place] FDA and has been authorized for detection and/or diagnosis of SARS-CoV-2 by FDA under an Emergency Use Authorization (EUA).  This EUA will remain in effect (meaning this test can be used) for the duration of the COVID-19 declaration under Section 564(b)(1) of the Act, 21 U.S.C. section 360bbb-3(b)(1), unless the authorization is terminated or revoked sooner. Performed at [facility], [address] [place], [place] [address]   Novel Coronavirus, NAA (hospital order; send-out to ref lab)     Status: None   Collection Time: 06/05/19  4:08 PM   Specimen: Nasopharyngeal Swab; Respiratory  Result Value Ref Range Status   SARS-CoV-2, NAA NOT DETECTED NOT DETECTED Final    Comment: (NOTE) This test was developed and its performance characteristics determined by [employer]. This test has not been FDA cleared or  approved. This test has been authorized by FDA under an Emergency Use Authorization (EUA). This test is only authorized for the duration of time the declaration that circumstances exist justifying the authorization of the emergency use of in vitro diagnostic tests for detection of SARS-CoV-2 virus and/or diagnosis of COVID-19 infection under section 564(b)(1) of the Act, 21 U.S.C. 712WPY-0(D)(9), unless the authorization is terminated or revoked sooner. When diagnostic testing is negative, the possibility of a false negative result should be considered in the context of a patient's recent exposures and the presence of clinical signs and symptoms consistent with COVID-19. An individual without symptoms of COVID-19 and who is not shedding SARS-CoV-2 virus would expect to have a negative (not detected) result in this assay. Performed  At: [facility]  [place] [address] [place], [place] [id number] Rush Farmer MD GY:[id number]    Coronavirus Source NASOPHARYNGEAL  Final    Comment: Performed at [facility], [address] [place], [place] [address]  SARS Coronavirus 2 ([place] - Performed in [facility] hospital lab), Hosp Order     Status: None   Collection Time: 06/09/19 10:45 AM   Specimen: Nasopharyngeal Swab  Result Value Ref Range Status   SARS Coronavirus 2 NEGATIVE NEGATIVE Final    Comment: (NOTE) If result is NEGATIVE SARS-CoV-2 target nucleic acids are NOT DETECTED. The SARS-CoV-2 RNA is generally detectable in upper and lower  respiratory specimens during the acute phase of infection. The lowest  concentration of SARS-CoV-2 viral copies this assay can detect is 250  copies / mL. A negative result does not preclude SARS-CoV-2 infection  and should not be used as the sole basis for treatment or other  patient management decisions.  A negative result may occur with  improper specimen collection / handling, submission of specimen other  than nasopharyngeal  swab, presence of viral mutation(s) within the  areas targeted by this assay, and inadequate number of viral copies  (<250 copies / mL). A negative result must be combined with clinical  observations, patient history, and epidemiological information. If result is POSITIVE SARS-CoV-2 target nucleic acids are DETECTED. The SARS-CoV-2 RNA is generally detectable in upper and lower  respiratory specimens dur ing the acute phase of infection.  Positive  results are indicative of active infection with SARS-CoV-2.  Clinical  correlation with patient history and other diagnostic information is  necessary to determine patient infection status.  Positive results do  not rule out bacterial infection or co-infection with other viruses. If result is PRESUMPTIVE POSTIVE SARS-CoV-2 nucleic acids MAY BE PRESENT.   A presumptive positive result was obtained on the submitted specimen  and confirmed on repeat testing.  While 2019 novel coronavirus  (SARS-CoV-2) nucleic acids may be present in the submitted sample  additional confirmatory testing may be necessary for epidemiological  and / or clinical management purposes  to differentiate between  SARS-CoV-2 and other Sarbecovirus currently known to infect humans.  If clinically indicated additional testing with an alternate test  methodology [phone]) is advised. The SARS-CoV-2 RNA is generally  detectable in upper and lower respiratory sp ecimens during the acute  phase of infection. The expected result is Negative. Fact Sheet for Patients:  StrictlyIdeas.no Fact Sheet for Healthcare Providers: BankingDealers.co.za This test is not yet approved or cleared by the [place] FDA and has been authorized for detection and/or diagnosis of SARS-CoV-2 by FDA under an Emergency Use Authorization (EUA).  This EUA will remain in effect (meaning this test can be used) for the duration of the COVID-19 declaration  under Section 564(b)(1) of the Act, 21 U.S.C. section 360bbb-3(b)(1), unless the authorization is terminated or revoked sooner. Performed at [facility], [address] [place], [place] [address]     Coagulation Studies: Recent Labs    06/07/19 0308 06/08/19 0332 06/09/19 0414  LABPROT 31.6* 25.0* 21.1*  INR 3.1* 2.3* 1.9*    Urinalysis: Recent Labs    06/08/19 2110  COLORURINE YELLOW*  LABSPEC 1.005  PHURINE 5.0  GLUCOSEU NEGATIVE  HGBUR NEGATIVE  BILIRUBINUR NEGATIVE  KETONESUR NEGATIVE  PROTEINUR NEGATIVE  NITRITE NEGATIVE  LEUKOCYTESUR SMALL*      Imaging: Dg Foot 2 Views Left  Result Date: 06/09/2019 CLINICAL DATA:  85 year old female with a history of heel pain radiating  to ankle and calf after a fall EXAM: LEFT FOOT - 2 VIEW COMPARISON:  None. FINDINGS: Diffuse osteopenia. Degenerative changes of the interphalangeal joints. No subluxation/dislocation. No radiopaque foreign body. No focal soft tissue swelling. Questionable irregularity of the medial malleolus as well as the neck of the talus on the lateral view. Vascular calcifications of the tibial arteries IMPRESSION: No acute bony abnormality of the forefoot. Osteopenia somewhat limits evaluation, with questionable irregularity involving the neck of the talus on the lateral view and the medial malleolus on the AP view. Further evaluation with dedicated ankle series may be useful, or alternatively ankle CT. Atherosclerosis of the tibial arteries Electronically Signed   By: Corrie Mckusick D.O.   On: 06/09/2019 10:47     Medications:    . amLODipine  10 mg Oral Daily  . B-complex with vitamin C  1 tablet Oral Daily  . famotidine  20 mg Oral Daily  . ferrous sulfate  325 mg Oral BID WC  . furosemide  20 mg Oral Daily  . gabapentin  600 mg Oral QHS  . insulin aspart  0-9 Units Subcutaneous TID WC  . ipratropium-albuterol  3 mL Nebulization Q6H  . levofloxacin  500 mg Oral Daily  . loratadine  10 mg  Oral Daily  . magnesium oxide  400 mg Oral Daily  . montelukast  10 mg Oral QHS  . nystatin cream   Topical BID  . oxybutynin  10 mg Oral Daily  . polyethylene glycol  17 g Oral Daily  . pravastatin  40 mg Oral Daily  . senna-docusate  1 tablet Oral BID  . sodium chloride  1 g Oral TID WC  . theophylline  100 mg Oral QODAY  . tolvaptan  15 mg Oral Q24H  . warfarin  3 mg Oral ONCE-1800  . Warfarin - Pharmacist Dosing Inpatient   Does not apply q1800   acetaminophen, bisacodyl, guaiFENesin, HYDROmorphone (DILAUDID) injection, magnesium hydroxide, menthol-cetylpyridinium **OR** phenol, metoCLOPramide **OR** metoCLOPramide (REGLAN) injection, ondansetron **OR** ondansetron (ZOFRAN) IV, oxyCODONE, oxyCODONE, sodium phosphate, traZODone  Assessment/ Plan:  85 y.o. female with a PMHx of atrial fibrillation, bronchitis, COPD, diabetes mellitus type 2, hypertension, hyperlipidemia, pancreatitis, pneumonia, prior history of CVA, vitamin D B12 deficiency, who was admitted to [facility] on 05/31/2019 for evaluation of left hip fracture.  1.  Hyponatremia, suspect SIADH 2.  Hypertension. 3.  Anemia unspecified hemoglobin 10.1.  Plan: Serum sodium now up to 129.  Okay to discontinue tolvaptan at this time.  Continue furosemide 20 mg daily, salt tablets 1 g 3 times daily, and fluid restriction of 1.2 L when she goes to rehabilitation.  We plan to see the patient back in the office in the next week or 2 for follow-up of her hyponatremia.  Thanks for allowing [place] to participate.   LOS: 9 Laurey Salser 7/25/202012:33 PM

## 2019-06-09 NOTE — Progress Notes (Signed)
[place] for warfarin Indication: atrial fibrillation  Allergies  Allergen Reactions  . Sulfa Antibiotics Swelling  . Celecoxib Nausea And Vomiting  . Acetaminophen Itching  . Codeine Rash  . Lyrica [Pregabalin] Rash  . Penicillin G Rash    Has patient had a PCN reaction causing immediate rash, facial/tongue/throat swelling, SOB or lightheadedness with hypotension: Yes Has patient had a PCN reaction causing severe rash involving mucus membranes or skin necrosis: No Has patient had a PCN reaction that required hospitalization: No Has patient had a PCN reaction occurring within the last 10 years: Unknown If all of the above answers are "NO", then may proceed with Cephalosporin use.  Marland Kitchen Petrolatum-Zinc Oxide Rash    Patient Measurements: Height: 5\' 4"  (162.6 cm) Weight: 157 lb (71.2 kg) IBW/kg (Calculated) : 54.7  Vital Signs:    Labs: Recent Labs    06/06/19 1111 06/07/19 0308 06/08/19 0332 06/09/19 0414  HGB  --  10.1*  --   --   HCT  --  30.7*  --   --   PLT  --  331  --   --   LABPROT  --  31.6* 25.0* 21.1*  INR  --  3.1* 2.3* 1.9*  CREATININE 0.84  --   --   --     Estimated Creatinine Clearance: 42.2 mL/min (by C-G formula based on SCr of 0.84 mg/dL).   Assessment: 85 year old female presented after a fall resulting in left femoral neck fracture. Patient now s/p left hip hemiarthroplasty. Patient on warfarin PTA for afib. Home dose 4 mg daily except 2 mg on Tuesdays. Pharmacy consulted to dose warfarin. Pt received vit K 2.5 mg 7/16.   Date INR Dose 7/19 1.5         5mg  7/20     2.0         4mg   7/21     2.5         2mg  7/22   2.9   1mg  7/23     3.1   1mg  7/24 2.3   3mg  7/25 1.9  Goal of Therapy:  INR 2-3 Monitor platelets by anticoagulation protocol: Yes   Plan:  INR slightly subtherapeutic this morning. Patient still receiving antibiotics (DDI: Levaquin).  Will order Warfarin 3 mg PO x1 today.   INR with  morning labs. CBC in AM per protocol.  Pernell Dupre, PharmD, BCPS Clinical Pharmacist 06/09/2019 5:19 AM

## 2019-06-09 NOTE — TOC Transition Note (Addendum)
Transition of Care [facility]) - CM/SW Discharge Note   Patient Details  Name: AMEA MCPHAIL MRN: [id number] Date of Birth: 01/03/1926  Transition of Care [facility]) CM/SW Contact:  Weston Anna, LCSW Phone Number: 06/09/2019, 11:19 AM   Clinical Narrative:     CSW spoke with patients daughter, [name], via phone to notify her of discharge plans for today. Patient will be returning to [facility] once covid test comes back. Please call report to [phone]. Patient will need transportation via EMS. DC summary faxed to facility.       Patient Goals and CMS Choice     Choice offered to / list presented to : Adult Children  Discharge Placement                       Discharge Plan and Services In-house Referral: Clinical Social Work Discharge Planning Services: CM Consult Post Acute Care Choice: Skilled Nursing Facility                               Social Determinants of Health (SDOH) Interventions     Readmission Risk Interventions Readmission Risk Prevention Plan 06/08/2019 06/08/2019  Transportation Screening Complete Complete  PCP or Specialist Appt within 3-5 Days Complete Complete  HRI or [place] - Complete  Social Work Consult for [place] Planning/Counseling Complete Complete  Palliative Care Screening Not Applicable Not Applicable  Medication Review [employer]) Complete Complete  Some recent data might be hidden

## 2019-06-09 NOTE — Progress Notes (Signed)
Physical Therapy Treatment Patient Details Name: Monica [name] MRN: [id number] DOB: [birth year] Today's Date: 06/09/2019    History of Present Illness Patient s/p L hip hemiarthroplasty due to hip fracture. PMH includes HTN, afib, DM, cancer (L leg, kidneys, lungs, breasts), COPD, HLD, TIAs.    PT Comments    Pt in bed.  Participated in exercises as described below.  X-ray techs in to do x-rays on L ankle due to c/o pain last night but pt c/o little to no pain with ex's.  Standing was deferred while awaiting results out of precaution but she was able to sit EOB x 20 minutes with no LOB.     Follow Up Recommendations  SNF     Equipment Recommendations       Recommendations for Other Services       Precautions / Restrictions Precautions Precautions: Fall;Posterior Hip Precaution Comments: s/p hemiarthroplasty Restrictions Weight Bearing Restrictions: No LLE Weight Bearing: Weight bearing as tolerated    Mobility  Bed Mobility Overal bed mobility: Needs Assistance Bed Mobility: Supine to Sit;Sit to Supine;Rolling Rolling: Min assist   Supine to sit: Min assist Sit to supine: Min assist      Transfers                 General transfer comment: deferred due to awaiting results of ankle x-ray  Ambulation/Gait                 Stairs             Wheelchair Mobility    Modified Rankin (Stroke Patients Only)       Balance Overall balance assessment: Needs assistance Sitting-balance support: Feet supported Sitting balance-Leahy Scale: Good Sitting balance - Comments: able to sit x 20 minutes without difficulty                                    Cognition Arousal/Alertness: Awake/alert Behavior During Therapy: WFL for tasks assessed/performed Overall Cognitive Status: Within Functional Limits for tasks assessed                                        Exercises Total Joint Exercises Ankle Circles/Pumps:  AROM;Both;20 reps;Supine Quad Sets: AROM;Left;10 reps Short Arc Quad: Left;AROM;15 reps;Supine Heel Slides: AAROM;Left;15 reps;Supine Hip ABduction/ADduction: AAROM;Left;15 reps;Supine Straight Leg Raises: AAROM;Left;10 reps    General Comments        Pertinent Vitals/Pain Pain Assessment: No/denies pain Pain Location: x-ray in for L ankle but does not c/o pain with exercises.  WB deferred as a precaution. Pain Descriptors / Indicators: Grimacing;Constant Pain Intervention(s): Limited activity within patient's tolerance    Home Living                      Prior Function            PT Goals (current goals can now be found in the care plan section) Progress towards PT goals: Progressing toward goals    Frequency    7X/week      PT Plan Current plan remains appropriate;Frequency needs to be updated    Co-evaluation              AM-PAC PT "6 Clicks" Mobility   Outcome Measure  Help needed turning from your back to your side while in  a flat bed without using bedrails?: A Little Help needed moving from lying on your back to sitting on the side of a flat bed without using bedrails?: A Little Help needed moving to and from a bed to a chair (including a wheelchair)?: Total Help needed standing up from a chair using your arms (e.g., wheelchair or bedside chair)?: A Lot Help needed to walk in hospital room?: Total Help needed climbing 3-5 steps with a railing? : Total 6 Click Score: 11    End of Session Equipment Utilized During Treatment: Oxygen;Gait belt Activity Tolerance: Patient tolerated treatment well;No increased pain;Patient limited by fatigue Patient left: in chair;with call bell/phone within reach;with chair alarm set   Pain - part of body: Hip     Time: 1003-1046 PT Time Calculation (min) (ACUTE ONLY): 43 min  Charges:  $Therapeutic Exercise: 8-22 mins $Therapeutic Activity: 23-37 mins                     Chesley Noon, PTA 06/09/19,  10:58 AM

## 2019-06-09 NOTE — Progress Notes (Signed)
Report called to Butch Penny RN at [facility] and Rx/DC packet prepared and reviewed with nurse. NT prepared patient for transportation via EMS. IVs removed. EMS called.

## 2019-06-09 NOTE — Progress Notes (Addendum)
Subjective: 6 Days Post-Op Procedure(s) (LRB): ARTHROPLASTY BIPOLAR HIP (HEMIARTHROPLASTY) (Left) Patient reports continued left hip pain, also complaining of some ankle pain.  No recent new falls or trauma. Patient is well, and has had no acute complaints or problems Progressing very slowly with therapy. Plan is to go Rehab after hospital stay. no nausea and no vomiting Patient denies any chest pains or shortness of breath.  Objective: Vital signs in last 24 hours: Temp:  [98.1 F (36.7 C)-98.5 F (36.9 C)] 98.2 F (36.8 C) (07/25 0822) Pulse Rate:  [98-108] 98 (07/25 0822) Resp:  [18] 18 (07/25 0822) BP: (128-142)/(55-59) 129/58 (07/25 0822) SpO2:  [87 %-100 %] 100 % (07/25 0822) well approximated incision Heels are non tender and elevated off the bed using rolled towels Intake/Output from previous day: 07/24 0701 - 07/25 0700 In: 300 [P.O.:300] Out: 1350 [Urine:1350] Intake/Output this shift: No intake/output data recorded.  Recent Labs    06/07/19 0308  HGB 10.1*   Recent Labs    06/07/19 0308  WBC 11.2*  RBC 3.76*  HCT 30.7*  PLT 331   Recent Labs    06/06/19 1111  06/08/19 2002 06/09/19 0414  NA 123*   < > 128* 129*  K 4.4  --   --   --   CL 89*  --   --   --   CO2 24  --   --   --   BUN 23  --   --   --   CREATININE 0.84  --   --   --   GLUCOSE 155*  --   --   --   CALCIUM 8.8*  --   --   --    < > = values in this interval not displayed.   Recent Labs    06/08/19 0332 06/09/19 0414  INR 2.3* 1.9*    EXAM General - Patient is Alert and Confused Extremity - Neurologically intact Neurovascular intact Sensation intact distally Intact pulses distally Dorsiflexion/Plantar flexion intact No cellulitis present Compartment soft  No ecchymosis along the incision.  No warmth or redness along the incision.  Patient does have mild edema along the incision site and posterior lateral hip. Left ankle tender along the anterior ankle mortise with no  swelling, edema, ecchymosis, warmth or redness.  Sock removed and SCD raised further up the leg which improved ankle pain. Dressing -moderate serous drainage.   Motor Function - intact, moving foot and toes well on exam.    Past Medical History:  Diagnosis Date  . Atrial fibrillation ([place])   . Bronchitis   . Cancer ([place])    Left leg growth, kidneys, lungs and breasts  . Carcinoma of unknown primary ([place])   . COPD (chronic obstructive pulmonary disease) ([place])   . Diabetes mellitus without complication ([place])   . Hyperlipidemia   . Hypertension   . Pancreatitis   . Pneumonia   . Stroke ([place])    TIA's  . Vitamin B12 deficiency     Assessment/Plan: 6 Days Post-Op Procedure(s) (LRB): ARTHROPLASTY BIPOLAR HIP (HEMIARTHROPLASTY) (Left) Active Problems:   'light-for-dates' infant with signs of fetal malnutrition   Closed left hip fracture (HCC)   Pressure injury of skin  Estimated body mass index is 26.95 kg/m as calculated from the following:   Height as of this encounter: 5\' 4"  (1.626 m).   Weight as of this encounter: 71.2 kg. Up with therapy Discharge to SNF when medically stable Overall patient doing well,  still having moderate pain in the left hip and making slow progress with PT. Serum sodium slowly trending up Orders placed for new dressing to left hip today. Patient will need to follow-up in clinical clinic in 2 weeks postop for staple removal.    DVT Prophylaxis - Coumadin, TED hose and SCDs Weight-Bearing as tolerated to left leg     [facility] 06/09/2019, 8:51 AM

## 2019-06-12 ENCOUNTER — Other Ambulatory Visit: Payer: Self-pay | Admitting: *Deleted

## 2019-06-12 DIAGNOSIS — I10 Essential (primary) hypertension: Secondary | ICD-10-CM | POA: Diagnosis not present

## 2019-06-12 DIAGNOSIS — S72002D Fracture of unspecified part of neck of left femur, subsequent encounter for closed fracture with routine healing: Secondary | ICD-10-CM | POA: Diagnosis not present

## 2019-06-12 DIAGNOSIS — I4891 Unspecified atrial fibrillation: Secondary | ICD-10-CM | POA: Diagnosis not present

## 2019-06-12 DIAGNOSIS — J45901 Unspecified asthma with (acute) exacerbation: Secondary | ICD-10-CM | POA: Diagnosis not present

## 2019-06-12 DIAGNOSIS — K219 Gastro-esophageal reflux disease without esophagitis: Secondary | ICD-10-CM | POA: Diagnosis not present

## 2019-06-12 DIAGNOSIS — E119 Type 2 diabetes mellitus without complications: Secondary | ICD-10-CM | POA: Diagnosis not present

## 2019-06-12 DIAGNOSIS — E1140 Type 2 diabetes mellitus with diabetic neuropathy, unspecified: Secondary | ICD-10-CM | POA: Diagnosis not present

## 2019-06-12 DIAGNOSIS — K5900 Constipation, unspecified: Secondary | ICD-10-CM | POA: Diagnosis not present

## 2019-06-12 NOTE — Patient Outreach (Signed)
Member assessed for potential [facility] Care Management needs as a benefit of  [place] Medicare.  Member is currently receiving rehab therapy at [facility].  Member discussed in weekly telephonic IDT meeting with  facility staff, [facility] UM RN, and writer.  Monica [name] lived in [place] at [facility] prior.   Writer to follow for disposition plans, progression, and for potential [facility] Care Management needs.   Will continue to collaborate with [facility] UM team and facility on member.   Marthenia Rolling, MSN-Ed, RN,BSN [place] Acute Care Coordinator [phone] [facility]) [phone]  (Toll free office)

## 2019-06-14 LAB — PATHOLOGY

## 2019-06-15 DIAGNOSIS — L89313 Pressure ulcer of right buttock, stage 3: Secondary | ICD-10-CM | POA: Diagnosis not present

## 2019-06-15 DIAGNOSIS — L89323 Pressure ulcer of left buttock, stage 3: Secondary | ICD-10-CM | POA: Diagnosis not present

## 2019-06-21 ENCOUNTER — Encounter: Payer: Self-pay | Admitting: Nurse Practitioner

## 2019-06-21 DIAGNOSIS — E1140 Type 2 diabetes mellitus with diabetic neuropathy, unspecified: Secondary | ICD-10-CM | POA: Diagnosis not present

## 2019-06-21 DIAGNOSIS — K5900 Constipation, unspecified: Secondary | ICD-10-CM | POA: Diagnosis not present

## 2019-06-21 DIAGNOSIS — J45901 Unspecified asthma with (acute) exacerbation: Secondary | ICD-10-CM | POA: Diagnosis not present

## 2019-06-21 DIAGNOSIS — S72002D Fracture of unspecified part of neck of left femur, subsequent encounter for closed fracture with routine healing: Secondary | ICD-10-CM | POA: Diagnosis not present

## 2019-06-21 DIAGNOSIS — I10 Essential (primary) hypertension: Secondary | ICD-10-CM | POA: Diagnosis not present

## 2019-06-21 DIAGNOSIS — I4891 Unspecified atrial fibrillation: Secondary | ICD-10-CM | POA: Diagnosis not present

## 2019-06-21 DIAGNOSIS — K219 Gastro-esophageal reflux disease without esophagitis: Secondary | ICD-10-CM | POA: Diagnosis not present

## 2019-06-21 DIAGNOSIS — D649 Anemia, unspecified: Secondary | ICD-10-CM | POA: Diagnosis not present

## 2019-06-21 LAB — PROTIME-INR
INR: 3 — AB (ref 0.9–1.1)
INR: 3.6 — AB (ref 0.9–1.1)
INR: 4.6 — AB (ref 0.9–1.1)
INR: 2 — AB (ref 0.9–1.1)
INR: 2.9 — AB (ref 0.9–1.1)

## 2019-06-25 ENCOUNTER — Other Ambulatory Visit: Payer: Self-pay

## 2019-06-25 ENCOUNTER — Ambulatory Visit (INDEPENDENT_AMBULATORY_CARE_PROVIDER_SITE_OTHER): Payer: Medicare Other | Admitting: Podiatry

## 2019-06-25 ENCOUNTER — Encounter: Payer: Self-pay | Admitting: Podiatry

## 2019-06-25 DIAGNOSIS — E1159 Type 2 diabetes mellitus with other circulatory complications: Secondary | ICD-10-CM

## 2019-06-25 DIAGNOSIS — E1122 Type 2 diabetes mellitus with diabetic chronic kidney disease: Secondary | ICD-10-CM | POA: Insufficient documentation

## 2019-06-25 DIAGNOSIS — E1142 Type 2 diabetes mellitus with diabetic polyneuropathy: Secondary | ICD-10-CM | POA: Diagnosis not present

## 2019-06-25 DIAGNOSIS — E1140 Type 2 diabetes mellitus with diabetic neuropathy, unspecified: Secondary | ICD-10-CM | POA: Insufficient documentation

## 2019-06-25 NOTE — Progress Notes (Addendum)
This patient presents to the office with chief complaint of  diabetic feet.  This patient  says there  is  no pain and discomfort in her  feet.     Patient has no history of infection or drainage from both feet. . This patient presents  to the office today for  a foot evaluation due to history of  Diabetes. Patient has history of CVA and DVT.     General Appearance  Alert, conversant and in no acute stress.  Vascular  Dorsalis pedis and posterior tibial  pulses are not  palpable  Left foot. Dorsalis pedis is weakly palpable and posterior tibial pulse is absent right foot.  Capillary return is within normal limits  bilaterally. Temperature is within normal limits  bilaterally.  Neurologic  Senn-Weinstein monofilament wire test within normal limits  Bilaterally  Except under big toe joint left foot.. Muscle power within normal limits bilaterally.  Nails Normal nails noted with signs of infection or drainage.  Orthopedic  No limitations of motion of motion feet .  No crepitus or effusions noted.  No bony pathology or digital deformities noted.  Skin  normotropic skin with no porokeratosis noted bilaterally.  No signs of infections or ulcers noted.        Diabetes with vascular disease  IE   A diabetic foot exam was performed and there is no evidence of  neurologic pathology.   Vascular disease is noted  B/L.  RTC prn.  Nursing home has sent this patient over instead of the originally scheduled patient.  Therefore a diabetic foot exam was performed on this patient today   Gardiner Barefoot DPM

## 2019-07-03 ENCOUNTER — Other Ambulatory Visit: Payer: Self-pay | Admitting: *Deleted

## 2019-07-03 NOTE — Patient Outreach (Signed)
Member assessed for potential [facility] Care Management needs as a benefit of [place] Medicare.  Confirmed in [facility] that [name] discharged from [facility] on 06/27/19.  Telephone call made to [name] at [phone] to discuss [place] Management follow up. Patient identifiers confirmed.   Explained [facility] Care Management program. [name] declines by stating " I have had DM for years. I know how to take my meds. I am in my right mind. I do not need any services. You should have called me before I fell. I do not need anyone now. She then hung up."  Writer to sign off as member adamantly declined [place] Management services.  Will make [facility] UM RN aware.   Marthenia Rolling, MSN-Ed, RN,BSN [place] Acute Care Coordinator [phone] [facility]) [phone]  (Toll free office)

## 2019-07-06 ENCOUNTER — Ambulatory Visit (INDEPENDENT_AMBULATORY_CARE_PROVIDER_SITE_OTHER): Payer: Medicare Other

## 2019-07-06 ENCOUNTER — Other Ambulatory Visit: Payer: Self-pay

## 2019-07-06 DIAGNOSIS — Z7901 Long term (current) use of anticoagulants: Secondary | ICD-10-CM

## 2019-07-06 DIAGNOSIS — I8290 Acute embolism and thrombosis of unspecified vein: Secondary | ICD-10-CM

## 2019-07-06 NOTE — Progress Notes (Signed)
Pt inr 4.3 as per heather  pt advised that skip today and go back to usual dose and follow up next week

## 2019-07-16 ENCOUNTER — Ambulatory Visit: Payer: Medicare Other

## 2019-07-16 DIAGNOSIS — I4891 Unspecified atrial fibrillation: Secondary | ICD-10-CM

## 2019-07-16 DIAGNOSIS — Z7901 Long term (current) use of anticoagulants: Secondary | ICD-10-CM

## 2019-07-16 NOTE — Progress Notes (Signed)
Pt   INR 2.8 continue same med as per [employer]

## 2019-07-17 ENCOUNTER — Encounter: Payer: Self-pay | Admitting: Nurse Practitioner

## 2019-07-17 LAB — PROTIME-INR
INR: 2.8 — AB (ref 0.9–1.1)
INR: 4.3 — AB (ref 0.9–1.1)

## 2019-07-18 ENCOUNTER — Other Ambulatory Visit: Payer: Self-pay

## 2019-07-18 ENCOUNTER — Ambulatory Visit (INDEPENDENT_AMBULATORY_CARE_PROVIDER_SITE_OTHER): Payer: Medicare Other | Admitting: Nurse Practitioner

## 2019-07-18 ENCOUNTER — Encounter: Payer: Self-pay | Admitting: Nurse Practitioner

## 2019-07-18 ENCOUNTER — Telehealth: Payer: Self-pay

## 2019-07-18 VITALS — BP 113/58 | HR 94 | Resp 16 | Ht 64.0 in | Wt 154.8 lb

## 2019-07-18 DIAGNOSIS — J449 Chronic obstructive pulmonary disease, unspecified: Secondary | ICD-10-CM

## 2019-07-18 DIAGNOSIS — I10 Essential (primary) hypertension: Secondary | ICD-10-CM

## 2019-07-18 DIAGNOSIS — L209 Atopic dermatitis, unspecified: Secondary | ICD-10-CM

## 2019-07-18 DIAGNOSIS — L03317 Cellulitis of buttock: Secondary | ICD-10-CM | POA: Diagnosis not present

## 2019-07-18 DIAGNOSIS — E1165 Type 2 diabetes mellitus with hyperglycemia: Secondary | ICD-10-CM | POA: Diagnosis not present

## 2019-07-18 LAB — POCT GLYCOSYLATED HEMOGLOBIN (HGB A1C): Hemoglobin A1C: 6 % — AB (ref 4.0–5.6)

## 2019-07-18 MED ORDER — DOXYCYCLINE HYCLATE 100 MG PO TABS: 100.0000 mg | ORAL_TABLET | Freq: Two times a day (BID) | ORAL | 0 refills | Status: DC

## 2019-07-18 MED ORDER — NYSTATIN 100000 UNIT/GM EX CREA: TOPICAL_CREAM | Freq: Two times a day (BID) | CUTANEOUS | 0 refills | Status: DC

## 2019-07-18 NOTE — Telephone Encounter (Signed)
Phar called that coumadin and doxycycline interact each other as per heather is ok and we closely monitor and recheck coumadin 1 week

## 2019-07-18 NOTE — Progress Notes (Signed)
[facility] [place], [place] [address]  Internal MEDICINE  Office Visit Note  Patient Name: Monica [name]  [id number]  [id number]  Date of Service: 07/25/2019  Chief Complaint  Patient presents with  . Diabetes  . Hyperlipidemia  . Hypertension  . Quality Metric Gaps    diabetic eye exam  . Medication Management    pt is confused on what her pravastatin, lasix, and famotidine is for     The patient is here for follow up visit. She was recently hosptialized for left hip fracture. She fell at home. She had left hip replacement. She spent three weeks at [facility]. She has been home for 3 weeks today. She states that when she was discharged, she had bedsores on both buttocks. The sore on left buttock has healed. She states that the sore on right buttock is taking a much longer time to heal. It is sore and hurts when she has to sit for any length of time.       Current Medication: Outpatient Encounter Medications as of 07/18/2019  Medication Sig  . albuterol (VENTOLIN HFA) 108 (90 Base) MCG/ACT inhaler Inhale 2 puffs into the lungs every 4 (four) hours as needed for wheezing or shortness of breath.  Marland Kitchen amLODipine-benazepril (LOTREL) 5-20 MG capsule   . b complex vitamins capsule Take 1 capsule by mouth daily.  . famotidine (PEPCID) 20 MG tablet Take 1 tablet (20 mg total) by mouth 2 (two) times daily.  . fluticasone furoate-vilanterol (BREO ELLIPTA) 100-25 MCG/INH AEPB TAKE 1 PUFF ONCE A DAY  . furosemide (LASIX) 20 MG tablet Take 20 mg by mouth daily.   Marland Kitchen gabapentin (NEURONTIN) 300 MG capsule Take 2 capsules (600 mg total) by mouth at bedtime.  Marland Kitchen glipiZIDE (GLUCOTROL XL) 2.5 MG 24 hr tablet Take 1 tablet (2.5 mg total) by mouth daily.  . hydrochlorothiazide (HYDRODIURIL) 25 MG tablet   . Liniments (SALONPAS PAIN RELIEF PATCH EX) Apply topically.  . Magnesium 200 MG TABS Take 1 tablet (200 mg total) by mouth daily.  . metFORMIN (GLUCOPHAGE-XR) 500 MG 24  hr tablet Take 2 tablets (1,000 mg total) by mouth daily.  . montelukast (SINGULAIR) 10 MG tablet Take 10 mg by mouth at bedtime.  Marland Kitchen nystatin cream (MYCOSTATIN) Apply topically 2 (two) times daily.  . ONE TOUCH ULTRA TEST test strip TEST ONCE A DAY AS DIRECTED  . pravastatin (PRAVACHOL) 40 MG tablet Take 1 tablet (40 mg total) by mouth daily.  . sodium chloride 1 g tablet Take 1 tablet (1 g total) by mouth 3 (three) times daily with meals.  . theophylline (THEO-24) 100 MG 24 hr capsule TAKE 1 CAPSULE BY MOUTH EVERY DAY  . triamcinolone (KENALOG) 0.025 % cream Apply 1 application topically 2 (two) times daily. Ok to mix with topical lotion and apply to all affected areas.  . warfarin (COUMADIN) 4 MG tablet TAKE ONE TABLET EVERY DAY EXCEPT MONDAY TAKE 1/2 TABLET  . [DISCONTINUED] desloratadine (CLARINEX) 5 MG tablet Take 1 tablet (5 mg total) by mouth daily.  . [DISCONTINUED] nystatin cream (MYCOSTATIN) Apply topically 2 (two) times daily.  . [DISCONTINUED] oxybutynin (DITROPAN-XL) 10 MG 24 hr tablet Take 1 tablet (10 mg total) by mouth daily.  Marland Kitchen doxycycline (VIBRA-TABS) 100 MG tablet Take 1 tablet (100 mg total) by mouth 2 (two) times daily.  . [DISCONTINUED] amLODipine (NORVASC) 10 MG tablet Take 1 tablet (10 mg total) by mouth daily. (Patient not taking: Reported on 07/18/2019)  . [  DISCONTINUED] oxyCODONE (OXY IR/ROXICODONE) 5 MG immediate release tablet Take 1 tablet (5 mg total) by mouth every 4 (four) hours as needed for moderate pain. (Patient not taking: Reported on 07/18/2019)  . [DISCONTINUED] Umeclidinium-Vilanterol (ANORO ELLIPTA) 62.5-25 MCG/INH AEPB Inhale 1 puff into the lungs daily.    No facility-administered encounter medications on file as of 07/18/2019.     Surgical History: Past Surgical History:  Procedure Laterality Date  . ABDOMINAL HYSTERECTOMY  1975  . BLADDER SUSPENSION  1989  . COLONOSCOPY    . COLONOSCOPY WITH PROPOFOL N/A 01/25/2018   Procedure: COLONOSCOPY WITH  PROPOFOL;  Surgeon: [name], MD;  Location[facility] ENDOSCOPY;  Service: Endoscopy;  Laterality: N/A;  . ESOPHAGOGASTRODUODENOSCOPY (EGD) WITH PROPOFOL N/A 01/24/2018   Procedure: ESOPHAGOGASTRODUODENOSCOPY (EGD) WITH PROPOFOL;  Surgeon: [name], MD;  Location: [facility] ENDOSCOPY;  Service: Endoscopy;  Laterality: N/A;  . HIP ARTHROPLASTY Left 06/03/2019   Procedure: ARTHROPLASTY BIPOLAR HIP (HEMIARTHROPLASTY);  Surgeon: Dereck Leep, MD;  Location: [facility] ORS;  Service: Orthopedics;  Laterality: Left;    Medical History: Past Medical History:  Diagnosis Date  . Atrial fibrillation ([place])   . Bronchitis   . Cancer ([place])    Left leg growth, kidneys, lungs and breasts  . Carcinoma of unknown primary ([place])   . COPD (chronic obstructive pulmonary disease) ([place])   . Diabetes mellitus without complication ([place])   . Hyperlipidemia   . Hypertension   . Pancreatitis   . Pneumonia   . Stroke ([place])    TIA's  . Vitamin B12 deficiency     Family History: Family History  Problem Relation Age of Onset  . Cancer Father   . Diabetes Brother     Social History   Socioeconomic History  . Marital status: Divorced    Spouse name: Not on file  . Number of children: Not on file  . Years of education: Not on file  . Highest education level: Not on file  Occupational History  . Not on file  Social Needs  . Financial resource strain: Not on file  . Food insecurity    Worry: Not on file    Inability: Not on file  . Transportation needs    Medical: Not on file    Non-medical: Not on file  Tobacco Use  . Smoking status: Former Smoker    Packs/day: 0.50    Years: 15.00    Pack years: 7.50    Types: Cigarettes    Quit date: 11/15/1988    Years since quitting: 30.7  . Smokeless tobacco: Never Used  Substance and Sexual Activity  . Alcohol use: No    Alcohol/week: 0.0 standard drinks  . Drug use: No  . Sexual activity: Not on file    Comment: hysterectomy   Lifestyle  . Physical  activity    Days per week: Not on file    Minutes per session: Not on file  . Stress: Not on file  Relationships  . Social Herbalist on phone: Not on file    Gets together: Not on file    Attends religious service: Not on file    Active member of club or organization: Not on file    Attends meetings of clubs or organizations: Not on file    Relationship status: Not on file  . Intimate partner violence    Fear of current or ex partner: Not on file    Emotionally abused: Not on file    Physically  abused: Not on file    Forced sexual activity: Not on file  Other Topics Concern  . Not on file  Social History Narrative  . Not on file      Review of Systems  Constitutional: Positive for activity change. Negative for chills, fatigue and unexpected weight change.  HENT: Negative for congestion, postnasal drip, rhinorrhea, sneezing and sore throat.   Respiratory: Positive for shortness of breath. Negative for cough, chest tightness and wheezing.   Cardiovascular: Positive for leg swelling. Negative for chest pain and palpitations.  Gastrointestinal: Negative for abdominal pain, constipation, diarrhea, nausea and vomiting.  Endocrine: Negative for cold intolerance, heat intolerance, polydipsia and polyuria.       Blood sugars doing well   Musculoskeletal: Positive for arthralgias, back pain and gait problem. Negative for joint swelling and neck pain.  Skin: Negative for rash.       Non healing wound on the right buttock. Second lesion on left buttock is healing well but still sore.  Neurological: Positive for weakness. Negative for dizziness, tremors, numbness and headaches.  Hematological: Negative for adenopathy. Does not bruise/bleed easily.  Psychiatric/Behavioral: Negative for behavioral problems (Depression), sleep disturbance and suicidal ideas. The patient is not nervous/anxious.     Today's Vitals   07/18/19 1036  BP: (!) 113/58  Pulse: 94  Resp: 16  SpO2: 97%   Weight: 154 lb 12.8 oz (70.2 kg)  Height: 5\' 4"  (1.626 m)   Body mass index is 26.57 kg/m.  Physical Exam Vitals signs and nursing note reviewed.  Constitutional:      General: She is not in acute distress.    Appearance: Normal appearance. She is well-developed. She is not diaphoretic.  HENT:     Head: Normocephalic and atraumatic.     Mouth/Throat:     Pharynx: No oropharyngeal exudate.  Eyes:     Pupils: Pupils are equal, round, and reactive to light.  Neck:     Musculoskeletal: Normal range of motion and neck supple.     Thyroid: No thyromegaly.     Vascular: No JVD.     Trachea: No tracheal deviation.  Cardiovascular:     Rate and Rhythm: Normal rate. Rhythm irregular.     Heart sounds: Murmur present. No friction rub. No gallop.   Pulmonary:     Effort: Pulmonary effort is normal. No respiratory distress.     Breath sounds: Normal breath sounds. No wheezing or rales.  Chest:     Chest wall: No tenderness.  Abdominal:     General: Bowel sounds are normal.     Palpations: Abdomen is soft.     Tenderness: There is no abdominal tenderness.  Musculoskeletal: Normal range of motion.     Comments: Patient is using a rolling walker with seat to help with mobility.   Lymphadenopathy:     Cervical: No cervical adenopathy.  Skin:    General: Skin is warm and dry.     Findings: Erythema and lesion present.     Comments: There is open wound on the right buttock which is about 3cm in diameter. There is some necrotic skin present in center of wound. Small area of erythema surrounding central wound. Tender to palpate. Draining small amount of serosanguinous fluid.   Neurological:     Mental Status: She is alert and oriented to person, place, and time.     Cranial Nerves: No cranial nerve deficit.  Psychiatric:        Behavior: Behavior normal.  Thought Content: Thought content normal.        Judgment: Judgment normal.    Assessment/Plan: 1. Type 2 diabetes mellitus  with hyperglycemia, unspecified whether long term insulin use (HCC) - POCT HgB A1C 6.0 today. Continue diabetic medication as prescribed.   2. Cellulitis of right buttock Start doxycycline 100mg  twice daily for next 2 weeks. Keep wound clean and dry and cover with clean and dry dressing.  - doxycycline (VIBRA-TABS) 100 MG tablet; Take 1 tablet (100 mg total) by mouth 2 (two) times daily.  Dispense: 28 tablet; Refill: 0  3. Atopic dermatitis, unspecified type May apply nystatin cream to skin surrounding wound on buttock. Use twice daily.  - nystatin cream (MYCOSTATIN); Apply topically 2 (two) times daily.  Dispense: 30 g; Refill: 0  4. Essential hypertension Stable. Continue bp medication as prescribed.   5. Chronic obstructive pulmonary disease, unspecified COPD type ([place]) Stable.   General Counseling: Timika verbalizes understanding of the findings of todays visit and agrees with plan of treatment. I have discussed any further diagnostic evaluation that may be needed or ordered today. We also reviewed her medications today. she has been encouraged to call the office with any questions or concerns that should arise related to todays visit.  This patient was seen by Leretha Pol FNP Collaboration with Dr Lavera Guise as a part of collaborative care agreement  Orders Placed This Encounter  Procedures  . POCT HgB A1C    Meds ordered this encounter  Medications  . nystatin cream (MYCOSTATIN)    Sig: Apply topically 2 (two) times daily.    Dispense:  30 g    Refill:  0    Order Specific Question:   Supervising Provider    Answer:   Lavera Guise [id number]  . doxycycline (VIBRA-TABS) 100 MG tablet    Sig: Take 1 tablet (100 mg total) by mouth 2 (two) times daily.    Dispense:  28 tablet    Refill:  0    Order Specific Question:   Supervising Provider    Answer:   Lavera Guise [id number]    Time spent: 53 Minutes      Dr Lavera Guise Internal medicine

## 2019-07-19 ENCOUNTER — Other Ambulatory Visit: Payer: Self-pay

## 2019-07-19 DIAGNOSIS — R350 Frequency of micturition: Secondary | ICD-10-CM

## 2019-07-19 DIAGNOSIS — L209 Atopic dermatitis, unspecified: Secondary | ICD-10-CM

## 2019-07-19 DIAGNOSIS — L239 Allergic contact dermatitis, unspecified cause: Secondary | ICD-10-CM

## 2019-07-19 MED ORDER — DESLORATADINE 5 MG PO TABS: 5.0000 mg | ORAL_TABLET | Freq: Every day | ORAL | 3 refills | Status: DC

## 2019-07-19 MED ORDER — OXYBUTYNIN CHLORIDE ER 10 MG PO TB24: 10.0000 mg | ORAL_TABLET | Freq: Every day | ORAL | 3 refills | Status: DC

## 2019-07-25 DIAGNOSIS — L03317 Cellulitis of buttock: Secondary | ICD-10-CM | POA: Insufficient documentation

## 2019-07-26 ENCOUNTER — Other Ambulatory Visit: Payer: Self-pay

## 2019-07-26 MED ORDER — ONETOUCH ULTRA VI STRP: 1.0000 | ORAL_STRIP | Freq: Every day | 3 refills | Status: DC

## 2019-07-27 ENCOUNTER — Other Ambulatory Visit: Payer: Self-pay

## 2019-07-27 ENCOUNTER — Ambulatory Visit (INDEPENDENT_AMBULATORY_CARE_PROVIDER_SITE_OTHER): Payer: Medicare Other

## 2019-07-27 ENCOUNTER — Other Ambulatory Visit: Payer: Self-pay | Admitting: Family Medicine

## 2019-07-27 DIAGNOSIS — I480 Paroxysmal atrial fibrillation: Secondary | ICD-10-CM | POA: Diagnosis not present

## 2019-07-27 DIAGNOSIS — Z7901 Long term (current) use of anticoagulants: Secondary | ICD-10-CM | POA: Diagnosis not present

## 2019-07-27 DIAGNOSIS — I4821 Permanent atrial fibrillation: Secondary | ICD-10-CM | POA: Diagnosis not present

## 2019-07-27 NOTE — Progress Notes (Signed)
Pt inr 3.2 continue same med as pres as per [employer]

## 2019-08-06 ENCOUNTER — Ambulatory Visit (INDEPENDENT_AMBULATORY_CARE_PROVIDER_SITE_OTHER): Payer: Medicare Other

## 2019-08-06 DIAGNOSIS — Z7901 Long term (current) use of anticoagulants: Secondary | ICD-10-CM | POA: Diagnosis not present

## 2019-08-06 DIAGNOSIS — I8290 Acute embolism and thrombosis of unspecified vein: Secondary | ICD-10-CM

## 2019-08-07 NOTE — Progress Notes (Addendum)
Pt INR 3.1 continue same med as per [employer]

## 2019-08-08 ENCOUNTER — Encounter: Payer: Self-pay | Admitting: Nurse Practitioner

## 2019-08-08 LAB — PROTIME-INR
INR: 3.1 — AB (ref 0.9–1.1)
INR: 3.2 — AB (ref 0.9–1.1)

## 2019-08-13 ENCOUNTER — Other Ambulatory Visit: Payer: Self-pay

## 2019-08-13 ENCOUNTER — Ambulatory Visit (INDEPENDENT_AMBULATORY_CARE_PROVIDER_SITE_OTHER): Payer: Medicare Other

## 2019-08-13 DIAGNOSIS — I8290 Acute embolism and thrombosis of unspecified vein: Secondary | ICD-10-CM | POA: Diagnosis not present

## 2019-08-13 DIAGNOSIS — Z7901 Long term (current) use of anticoagulants: Secondary | ICD-10-CM | POA: Diagnosis not present

## 2019-08-13 NOTE — Progress Notes (Signed)
Pt  Inr 2.2  Continue same as per  Nira Conn

## 2019-08-15 ENCOUNTER — Other Ambulatory Visit: Payer: Self-pay

## 2019-08-15 MED ORDER — METFORMIN HCL ER 500 MG PO TB24: 750.0000 mg | ORAL_TABLET | Freq: Every day | ORAL | 2 refills | Status: DC

## 2019-08-15 MED ORDER — PRAVASTATIN SODIUM 40 MG PO TABS: 40.0000 mg | ORAL_TABLET | Freq: Every day | ORAL | 1 refills | Status: DC

## 2019-08-16 ENCOUNTER — Ambulatory Visit (INDEPENDENT_AMBULATORY_CARE_PROVIDER_SITE_OTHER): Payer: Medicare Other

## 2019-08-16 ENCOUNTER — Other Ambulatory Visit: Payer: Self-pay

## 2019-08-16 DIAGNOSIS — Z7901 Long term (current) use of anticoagulants: Secondary | ICD-10-CM

## 2019-08-16 NOTE — Progress Notes (Signed)
Pt INR 2.7 continue same med  

## 2019-08-21 ENCOUNTER — Encounter: Payer: Self-pay | Admitting: Nurse Practitioner

## 2019-08-21 ENCOUNTER — Ambulatory Visit (INDEPENDENT_AMBULATORY_CARE_PROVIDER_SITE_OTHER): Payer: Medicare Other | Admitting: Nurse Practitioner

## 2019-08-21 ENCOUNTER — Other Ambulatory Visit: Payer: Self-pay

## 2019-08-21 VITALS — BP 141/68 | HR 99 | Temp 97.4°F | Resp 16 | Ht 64.0 in | Wt 159.0 lb

## 2019-08-21 DIAGNOSIS — R300 Dysuria: Secondary | ICD-10-CM

## 2019-08-21 DIAGNOSIS — J449 Chronic obstructive pulmonary disease, unspecified: Secondary | ICD-10-CM

## 2019-08-21 DIAGNOSIS — I10 Essential (primary) hypertension: Secondary | ICD-10-CM | POA: Diagnosis not present

## 2019-08-21 DIAGNOSIS — L89319 Pressure ulcer of right buttock, unspecified stage: Secondary | ICD-10-CM | POA: Diagnosis not present

## 2019-08-21 DIAGNOSIS — E1165 Type 2 diabetes mellitus with hyperglycemia: Secondary | ICD-10-CM

## 2019-08-21 DIAGNOSIS — Z0001 Encounter for general adult medical examination with abnormal findings: Secondary | ICD-10-CM | POA: Diagnosis not present

## 2019-08-21 NOTE — Progress Notes (Signed)
[facility] [place], [place] [address]  Internal MEDICINE  Office Visit Note  Patient Name: Monica [name]  [id number]  [id number]  Date of Service: 08/21/2019   Pt is here for routine health maintenance examination  Chief Complaint  Patient presents with  . Annual Exam  . Diabetes  . Hypertension  . Hyperlipidemia     The patient is here for annual health maintenance exam she is diabetic and has very good control of her blood sugars. Her last hgba1c was 6.0 on 07/18/2019. Her INRs have been stable. At her most recent visit, she was treated with doxycycline BID for bedsore on the right buttock. This is much better than at her last check. Still a bit red, but necrotic tissue has resolved. She states this is no longer tender to touch and does not hurt when she sits down. She states that she had a fall this morning. Was reaching for clothes in her closet and lost her balance. She injured the thumb on her right hand. It is bruised however, she can move it without difficulty. She also states that her oldest daughter passed away this past 22-Feb-2023. The funeral is tomorrow.     Current Medication: Outpatient Encounter Medications as of 08/21/2019  Medication Sig  . albuterol (VENTOLIN HFA) 108 (90 Base) MCG/ACT inhaler Inhale 2 puffs into the lungs every 4 (four) hours as needed for wheezing or shortness of breath.  Marland Kitchen amLODipine-benazepril (LOTREL) 5-20 MG capsule   . b complex vitamins capsule Take 1 capsule by mouth daily.  Marland Kitchen desloratadine (CLARINEX) 5 MG tablet Take 1 tablet (5 mg total) by mouth daily.  Marland Kitchen doxycycline (VIBRA-TABS) 100 MG tablet Take 1 tablet (100 mg total) by mouth 2 (two) times daily.  . famotidine (PEPCID) 20 MG tablet Take 1 tablet (20 mg total) by mouth 2 (two) times daily.  . fluticasone furoate-vilanterol (BREO ELLIPTA) 100-25 MCG/INH AEPB TAKE 1 PUFF ONCE A DAY  . furosemide (LASIX) 20 MG tablet Take 20 mg by mouth daily.   Marland Kitchen gabapentin  (NEURONTIN) 300 MG capsule Take 2 capsules (600 mg total) by mouth at bedtime.  Marland Kitchen glipiZIDE (GLUCOTROL XL) 2.5 MG 24 hr tablet Take 1 tablet (2.5 mg total) by mouth daily.  Marland Kitchen glucose blood (ONETOUCH ULTRA) test strip 1 each by Other route daily. Use as instructed  . hydrochlorothiazide (HYDRODIURIL) 25 MG tablet   . Liniments (SALONPAS PAIN RELIEF PATCH EX) Apply topically.  . Magnesium 200 MG TABS Take 1 tablet (200 mg total) by mouth daily.  . metFORMIN (GLUCOPHAGE-XR) 500 MG 24 hr tablet Take 2 tablets (1,000 mg total) by mouth daily.  . montelukast (SINGULAIR) 10 MG tablet Take 10 mg by mouth at bedtime.  Marland Kitchen nystatin cream (MYCOSTATIN) Apply topically 2 (two) times daily.  Marland Kitchen oxybutynin (DITROPAN-XL) 10 MG 24 hr tablet Take 1 tablet (10 mg total) by mouth daily.  . pravastatin (PRAVACHOL) 40 MG tablet Take 1 tablet (40 mg total) by mouth daily.  . sodium chloride 1 g tablet Take 1 tablet (1 g total) by mouth 3 (three) times daily with meals.  . theophylline (THEO-24) 100 MG 24 hr capsule TAKE 1 CAPSULE BY MOUTH EVERY DAY  . triamcinolone (KENALOG) 0.025 % cream Apply 1 application topically 2 (two) times daily. Ok to mix with topical lotion and apply to all affected areas.  . warfarin (COUMADIN) 4 MG tablet TAKE ONE TABLET EVERY DAY EXCEPT MONDAY TAKE 1/2 TABLET   No facility-administered  encounter medications on file as of 08/21/2019.     Surgical History: Past Surgical History:  Procedure Laterality Date  . ABDOMINAL HYSTERECTOMY  1975  . BLADDER SUSPENSION  1989  . COLONOSCOPY    . COLONOSCOPY WITH PROPOFOL N/A 01/25/2018   Procedure: COLONOSCOPY WITH PROPOFOL;  Surgeon: [name], MD;  Location: [facility] ENDOSCOPY;  Service: Endoscopy;  Laterality: N/A;  . ESOPHAGOGASTRODUODENOSCOPY (EGD) WITH PROPOFOL N/A 01/24/2018   Procedure: ESOPHAGOGASTRODUODENOSCOPY (EGD) WITH PROPOFOL;  Surgeon: [name], MD;  Location: [facility] ENDOSCOPY;  Service: Endoscopy;  Laterality: N/A;  . HIP  ARTHROPLASTY Left 06/03/2019   Procedure: ARTHROPLASTY BIPOLAR HIP (HEMIARTHROPLASTY);  Surgeon: Dereck Leep, MD;  Location: [facility] ORS;  Service: Orthopedics;  Laterality: Left;    Medical History: Past Medical History:  Diagnosis Date  . Atrial fibrillation ([place])   . Bronchitis   . Cancer ([place])    Left leg growth, kidneys, lungs and breasts  . Carcinoma of unknown primary ([place])   . COPD (chronic obstructive pulmonary disease) ([place])   . Diabetes mellitus without complication ([place])   . Hyperlipidemia   . Hypertension   . Pancreatitis   . Pneumonia   . Stroke ([place])    TIA's  . Vitamin B12 deficiency     Family History: Family History  Problem Relation Age of Onset  . Cancer Father   . Diabetes Brother       Review of Systems  Constitutional: Negative for activity change, chills, fatigue and unexpected weight change.  HENT: Negative for congestion, postnasal drip, rhinorrhea, sneezing and sore throat.   Respiratory: Positive for shortness of breath. Negative for cough, chest tightness and wheezing.        Intermittent and well managed with inhalers and respiratory medication.   Cardiovascular: Positive for leg swelling. Negative for chest pain and palpitations.  Gastrointestinal: Negative for abdominal pain, constipation, diarrhea, nausea and vomiting.  Endocrine: Negative for cold intolerance, heat intolerance, polydipsia and polyuria.       Blood sugars doing well   Musculoskeletal: Positive for arthralgias, back pain and gait problem. Negative for joint swelling and neck pain.  Skin: Negative for rash.       Wound on right buttock is healing well. No longer tender to touch and does not hurt to sit down.   Neurological: Positive for weakness. Negative for dizziness, tremors, numbness and headaches.  Hematological: Negative for adenopathy. Does not bruise/bleed easily.  Psychiatric/Behavioral: Negative for behavioral problems (Depression), sleep disturbance and suicidal  ideas. The patient is not nervous/anxious.    Today's Vitals   08/21/19 1349  BP: (!) 141/68  Pulse: 99  Resp: 16  Temp: (!) 97.4 F (36.3 C)  SpO2: 97%  Weight: 159 lb (72.1 kg)  Height: 5\' 4"  (1.626 m)   Body mass index is 27.29 kg/m.  Physical Exam Vitals signs and nursing note reviewed.  Constitutional:      General: She is not in acute distress.    Appearance: Normal appearance. She is well-developed. She is not diaphoretic.  HENT:     Head: Normocephalic and atraumatic.     Mouth/Throat:     Pharynx: No oropharyngeal exudate.  Eyes:     Pupils: Pupils are equal, round, and reactive to light.  Neck:     Musculoskeletal: Normal range of motion and neck supple.     Thyroid: No thyromegaly.     Vascular: No JVD.     Trachea: No tracheal deviation.  Cardiovascular:     Rate  and Rhythm: Normal rate. Rhythm irregular.     Heart sounds: Murmur present. No friction rub. No gallop.   Pulmonary:     Effort: Pulmonary effort is normal. No respiratory distress.     Breath sounds: Normal breath sounds. No wheezing or rales.  Chest:     Chest wall: No tenderness.  Abdominal:     General: Bowel sounds are normal.     Palpations: Abdomen is soft.     Tenderness: There is no abdominal tenderness.  Musculoskeletal: Normal range of motion.     Comments: Patient is using a rolling walker with seat to help with mobility.   Lymphadenopathy:     Cervical: No cervical adenopathy.  Skin:    General: Skin is warm and dry.     Findings: Erythema and lesion present.     Comments: Lesion on right buttock is healing well. There is no longer necrotic tissue present. Some redness around the central lesion without evidence of infection. No drainage is present. Wound is non tender.   Neurological:     Mental Status: She is alert and oriented to person, place, and time. Mental status is at baseline.     Cranial Nerves: No cranial nerve deficit.  Psychiatric:        Behavior: Behavior  normal.        Thought Content: Thought content normal.        Judgment: Judgment normal.    Depression screen [facility] 2/9 08/21/2019 07/18/2019 05/07/2019 03/06/2019 11/13/2018  Decreased Interest 0 0 0 0 0  Down, Depressed, Hopeless 0 0 0 0 0  PHQ - 2 Score 0 0 0 0 0    Functional Status Survey: Is the patient deaf or have difficulty hearing?: Yes Does the patient have difficulty seeing, even when wearing glasses/contacts?: No Does the patient have difficulty concentrating, remembering, or making decisions?: No Does the patient have difficulty walking or climbing stairs?: Yes(uses a walker) Does the patient have difficulty dressing or bathing?: No Does the patient have difficulty doing errands alone such as visiting a doctor's office or shopping?: No  MMSE - [place] Exam 08/21/2019 06/30/2018  Orientation to time 5 5  Orientation to Place 5 5  Registration 3 3  Attention/ Calculation 5 5  Recall 3 3  Language- name 2 objects 2 2  Language- repeat 1 1  Language- follow 3 step command 3 1  Language- read & follow direction 1 0  Write a sentence 1 1  Copy design 1 0  Total score 30 26    Fall Risk  08/21/2019 07/18/2019 05/07/2019 03/06/2019 11/13/2018  Falls in the past year? 1 1 1 1  0  Number falls in past yr: 0 0 1 0 -  Injury with Fall? 0 1 0 0 -  Comment - hip broke, had surgery - - -      Assessment/Plan:  1. Encounter for general adult medical examination with abnormal findings Annual health maintenance exam today  2. Type 2 diabetes mellitus with hyperglycemia, without long-term current use of insulin (HCC) Good control of blood sugars. Continue medication as prescribed. Refer for diabetic eye exam.  - Ambulatory referral to Ophthalmology  3. Pressure injury of skin of right buttock, unspecified injury stage Improved. Continue topical treatment twice daily. Repeat antibiotic treatment as indicated   4. Essential hypertension Stable. Continue bp medication as  presctived.   5. Chronic obstructive pulmonary disease, unspecified COPD type ([place]) Stable. Continue inhalers and respiratory medication as  prescribed   6. Dysuria - UA/M w/rflx Culture, Routine   General Counseling: Kelsha verbalizes understanding of the findings of todays visit and agrees with plan of treatment. I have discussed any further diagnostic evaluation that may be needed or ordered today. We also reviewed her medications today. she has been encouraged to call the office with any questions or concerns that should arise related to todays visit.    Counseling:  This patient was seen by Leretha Pol FNP Collaboration with Dr Lavera Guise as a part of collaborative care agreement  Orders Placed This Encounter  Procedures  . UA/M w/rflx Culture, Routine  . Ambulatory referral to Ophthalmology     Time spent: Ben Avon, MD  Internal Medicine

## 2019-08-22 NOTE — Progress Notes (Signed)
Waiting on culture results.

## 2019-08-24 ENCOUNTER — Telehealth: Payer: Self-pay

## 2019-08-24 ENCOUNTER — Other Ambulatory Visit: Payer: Self-pay | Admitting: Nurse Practitioner

## 2019-08-24 DIAGNOSIS — I480 Paroxysmal atrial fibrillation: Secondary | ICD-10-CM | POA: Diagnosis not present

## 2019-08-24 DIAGNOSIS — R319 Hematuria, unspecified: Secondary | ICD-10-CM

## 2019-08-24 DIAGNOSIS — N390 Urinary tract infection, site not specified: Secondary | ICD-10-CM

## 2019-08-24 DIAGNOSIS — I4821 Permanent atrial fibrillation: Secondary | ICD-10-CM | POA: Diagnosis not present

## 2019-08-24 LAB — MICROSCOPIC EXAMINATION
Casts: NONE SEEN /lpf
RBC, Urine: NONE SEEN /hpf (ref 0–2)
WBC, UA: >30 /hpf — AB (ref 0–5)

## 2019-08-24 LAB — UA/M W/RFLX CULTURE, ROUTINE
Bilirubin, UA: NEGATIVE
Glucose, UA: NEGATIVE
Ketones, UA: NEGATIVE
Nitrite, UA: NEGATIVE
Protein,UA: NEGATIVE
RBC, UA: NEGATIVE
Specific Gravity, UA: 1.011 (ref 1.005–1.030)
Urobilinogen, Ur: 0.2 mg/dL (ref 0.2–1.0)
pH, UA: 6 (ref 5.0–7.5)

## 2019-08-24 LAB — URINE CULTURE, REFLEX

## 2019-08-24 MED ORDER — LEVOFLOXACIN 500 MG PO TABS: 500.0000 mg | ORAL_TABLET | Freq: Every day | ORAL | 0 refills | Status: DC

## 2019-08-24 NOTE — Progress Notes (Signed)
uti at time of CPE. Treat with levofloxacin 500mg  daily for 10 days. Monitor INR carefully. Will adjust dosing as indicated.

## 2019-08-24 NOTE — Telephone Encounter (Signed)
-----   Message from [name], NP sent at 08/24/2019  8:42 AM EDT ----- Please let the patient know that she had uti at time of CPE. Treat with levofloxacin 500mg  daily for 10 days. Monitor INR carefully. Will adjust dosing as indicated. I have [facility] the Lev[facility]quin [facility]. Thanks.

## 2019-08-24 NOTE — Progress Notes (Signed)
Please let the patient know that she had uti at time of CPE. Treat with levofloxacin 500mg  daily for 10 days. Monitor INR carefully. Will adjust dosing as indicated. I have sent the Levaquin [facility]. Thanks.

## 2019-08-24 NOTE — Telephone Encounter (Signed)
Pt advised we send med to [facility] for uti and we closely monitor INr  And if any change we call

## 2019-08-27 ENCOUNTER — Other Ambulatory Visit: Payer: Self-pay

## 2019-08-27 ENCOUNTER — Ambulatory Visit (INDEPENDENT_AMBULATORY_CARE_PROVIDER_SITE_OTHER): Payer: Medicare Other

## 2019-08-27 DIAGNOSIS — Z7901 Long term (current) use of anticoagulants: Secondary | ICD-10-CM | POA: Diagnosis not present

## 2019-08-27 NOTE — Progress Notes (Signed)
Pt inr 2.7 continue same med as per adam

## 2019-08-28 ENCOUNTER — Ambulatory Visit: Payer: BLUE CROSS/BLUE SHIELD

## 2019-08-30 ENCOUNTER — Ambulatory Visit (INDEPENDENT_AMBULATORY_CARE_PROVIDER_SITE_OTHER): Payer: Medicare Other

## 2019-08-30 ENCOUNTER — Other Ambulatory Visit: Payer: Self-pay

## 2019-08-30 DIAGNOSIS — Z7901 Long term (current) use of anticoagulants: Secondary | ICD-10-CM | POA: Diagnosis not present

## 2019-08-30 NOTE — Progress Notes (Signed)
Pt INR 3.6  Continue same nmed as per [employer]

## 2019-08-31 ENCOUNTER — Other Ambulatory Visit: Payer: Self-pay | Admitting: Internal Medicine

## 2019-09-04 DIAGNOSIS — Z23 Encounter for immunization: Secondary | ICD-10-CM | POA: Diagnosis not present

## 2019-09-06 ENCOUNTER — Telehealth: Payer: Self-pay

## 2019-09-06 ENCOUNTER — Other Ambulatory Visit: Payer: Self-pay

## 2019-09-06 ENCOUNTER — Ambulatory Visit (INDEPENDENT_AMBULATORY_CARE_PROVIDER_SITE_OTHER): Payer: Medicare Other

## 2019-09-06 DIAGNOSIS — Z7901 Long term (current) use of anticoagulants: Secondary | ICD-10-CM

## 2019-09-06 NOTE — Telephone Encounter (Signed)
Pt inr results today is 1.8. spoke with Nira Conn and advised pt to take 1.5 tabs of warfarin today and continue regular dose tomorrow.

## 2019-09-13 ENCOUNTER — Ambulatory Visit (INDEPENDENT_AMBULATORY_CARE_PROVIDER_SITE_OTHER): Payer: Medicare Other

## 2019-09-13 ENCOUNTER — Other Ambulatory Visit: Payer: Self-pay

## 2019-09-13 DIAGNOSIS — Z7901 Long term (current) use of anticoagulants: Secondary | ICD-10-CM

## 2019-09-13 NOTE — Progress Notes (Signed)
Pt INR 2.3  Continue same med as  Prescribed as per [employer]

## 2019-09-14 ENCOUNTER — Other Ambulatory Visit: Payer: Self-pay | Admitting: Nurse Practitioner

## 2019-09-14 MED ORDER — THEOPHYLLINE ER 100 MG PO CP24: ORAL_CAPSULE | ORAL | 3 refills | Status: DC

## 2019-09-14 MED ORDER — BREO ELLIPTA 100-25 MCG/INH IN AEPB: INHALATION_SPRAY | RESPIRATORY_TRACT | 3 refills | Status: DC

## 2019-09-14 MED ORDER — GABAPENTIN 300 MG PO CAPS: 600.0000 mg | ORAL_CAPSULE | Freq: Every day | ORAL | 5 refills | Status: DC

## 2019-09-20 ENCOUNTER — Ambulatory Visit (INDEPENDENT_AMBULATORY_CARE_PROVIDER_SITE_OTHER): Payer: Medicare Other

## 2019-09-20 ENCOUNTER — Other Ambulatory Visit: Payer: Self-pay

## 2019-09-20 DIAGNOSIS — Z7901 Long term (current) use of anticoagulants: Secondary | ICD-10-CM | POA: Diagnosis not present

## 2019-09-20 DIAGNOSIS — I4821 Permanent atrial fibrillation: Secondary | ICD-10-CM | POA: Diagnosis not present

## 2019-09-20 DIAGNOSIS — I480 Paroxysmal atrial fibrillation: Secondary | ICD-10-CM | POA: Diagnosis not present

## 2019-09-21 NOTE — Progress Notes (Signed)
Pt INR 2.3 continue same med as prescribed

## 2019-09-21 NOTE — Progress Notes (Signed)
Pt inr 1.8  As per heather advised pt take 1.5 tabs of coumadin today and continue a precribred

## 2019-09-28 ENCOUNTER — Ambulatory Visit (INDEPENDENT_AMBULATORY_CARE_PROVIDER_SITE_OTHER): Payer: Medicare Other

## 2019-09-28 ENCOUNTER — Other Ambulatory Visit: Payer: Self-pay

## 2019-09-28 DIAGNOSIS — Z7901 Long term (current) use of anticoagulants: Secondary | ICD-10-CM | POA: Diagnosis not present

## 2019-10-01 NOTE — Progress Notes (Signed)
Pt INR 2.5 continue same med as per [employer]

## 2019-10-03 ENCOUNTER — Telehealth: Payer: Self-pay

## 2019-10-03 ENCOUNTER — Other Ambulatory Visit: Payer: Self-pay | Admitting: Nurse Practitioner

## 2019-10-03 DIAGNOSIS — M25572 Pain in left ankle and joints of left foot: Secondary | ICD-10-CM

## 2019-10-03 DIAGNOSIS — M79672 Pain in left foot: Secondary | ICD-10-CM

## 2019-10-03 NOTE — Telephone Encounter (Signed)
Patient called with pain in foot from injury and wanted advice, I tolf pt we could put order for x ray but if it came back broken she would need to see podiatry, pt will think about it and call back if she wants to go for x ray or urgent care. Monica [name]

## 2019-10-04 ENCOUNTER — Other Ambulatory Visit: Payer: Self-pay

## 2019-10-04 ENCOUNTER — Ambulatory Visit (INDEPENDENT_AMBULATORY_CARE_PROVIDER_SITE_OTHER): Payer: Medicare Other

## 2019-10-04 DIAGNOSIS — Z7901 Long term (current) use of anticoagulants: Secondary | ICD-10-CM | POA: Diagnosis not present

## 2019-10-05 DIAGNOSIS — S93402A Sprain of unspecified ligament of left ankle, initial encounter: Secondary | ICD-10-CM | POA: Diagnosis not present

## 2019-10-05 DIAGNOSIS — S92155A Nondisplaced avulsion fracture (chip fracture) of left talus, initial encounter for closed fracture: Secondary | ICD-10-CM | POA: Diagnosis not present

## 2019-10-05 NOTE — Progress Notes (Signed)
Pt  inr 2.6 continue same med as prescribed

## 2019-10-15 ENCOUNTER — Ambulatory Visit (INDEPENDENT_AMBULATORY_CARE_PROVIDER_SITE_OTHER): Payer: Medicare Other

## 2019-10-15 ENCOUNTER — Other Ambulatory Visit: Payer: Self-pay

## 2019-10-15 DIAGNOSIS — Z7901 Long term (current) use of anticoagulants: Secondary | ICD-10-CM | POA: Diagnosis not present

## 2019-10-15 NOTE — Progress Notes (Signed)
Pt INR 2.8 continue same med as prescribed

## 2019-10-18 ENCOUNTER — Other Ambulatory Visit: Payer: Self-pay

## 2019-10-18 ENCOUNTER — Ambulatory Visit (INDEPENDENT_AMBULATORY_CARE_PROVIDER_SITE_OTHER): Payer: Medicare Other

## 2019-10-18 DIAGNOSIS — I4821 Permanent atrial fibrillation: Secondary | ICD-10-CM | POA: Diagnosis not present

## 2019-10-18 DIAGNOSIS — R350 Frequency of micturition: Secondary | ICD-10-CM

## 2019-10-18 DIAGNOSIS — Z7901 Long term (current) use of anticoagulants: Secondary | ICD-10-CM

## 2019-10-18 DIAGNOSIS — I4891 Unspecified atrial fibrillation: Secondary | ICD-10-CM

## 2019-10-18 DIAGNOSIS — I480 Paroxysmal atrial fibrillation: Secondary | ICD-10-CM | POA: Diagnosis not present

## 2019-10-18 MED ORDER — OXYBUTYNIN CHLORIDE ER 10 MG PO TB24: 10.0000 mg | ORAL_TABLET | Freq: Every day | ORAL | 3 refills | Status: DC

## 2019-10-18 MED ORDER — WARFARIN SODIUM 4 MG PO TABS: ORAL_TABLET | ORAL | 5 refills | Status: DC

## 2019-10-19 ENCOUNTER — Other Ambulatory Visit: Payer: Self-pay

## 2019-10-19 NOTE — Progress Notes (Signed)
Pt INR 3.0 continue same med

## 2019-10-24 ENCOUNTER — Emergency Department
Admission: EM | Admit: 2019-10-24 | Discharge: 2019-10-24 | Disposition: A | Discharge status: Home / Self Care | Payer: Medicare Other | Attending: Emergency Medicine | Admitting: Emergency Medicine

## 2019-10-24 ENCOUNTER — Other Ambulatory Visit: Payer: Self-pay

## 2019-10-24 ENCOUNTER — Emergency Department: Payer: Medicare Other

## 2019-10-24 DIAGNOSIS — J449 Chronic obstructive pulmonary disease, unspecified: Secondary | ICD-10-CM | POA: Diagnosis not present

## 2019-10-24 DIAGNOSIS — Z853 Personal history of malignant neoplasm of breast: Secondary | ICD-10-CM | POA: Insufficient documentation

## 2019-10-24 DIAGNOSIS — W01198A Fall on same level from slipping, tripping and stumbling with subsequent striking against other object, initial encounter: Secondary | ICD-10-CM | POA: Insufficient documentation

## 2019-10-24 DIAGNOSIS — Z7984 Long term (current) use of oral hypoglycemic drugs: Secondary | ICD-10-CM | POA: Diagnosis not present

## 2019-10-24 DIAGNOSIS — Z8673 Personal history of transient ischemic attack (TIA), and cerebral infarction without residual deficits: Secondary | ICD-10-CM | POA: Diagnosis not present

## 2019-10-24 DIAGNOSIS — S0101XA Laceration without foreign body of scalp, initial encounter: Secondary | ICD-10-CM | POA: Insufficient documentation

## 2019-10-24 DIAGNOSIS — Z85118 Personal history of other malignant neoplasm of bronchus and lung: Secondary | ICD-10-CM | POA: Diagnosis not present

## 2019-10-24 DIAGNOSIS — Z79899 Other long term (current) drug therapy: Secondary | ICD-10-CM | POA: Diagnosis not present

## 2019-10-24 DIAGNOSIS — R279 Unspecified lack of coordination: Secondary | ICD-10-CM | POA: Diagnosis not present

## 2019-10-24 DIAGNOSIS — Z7901 Long term (current) use of anticoagulants: Secondary | ICD-10-CM | POA: Insufficient documentation

## 2019-10-24 DIAGNOSIS — Y9389 Activity, other specified: Secondary | ICD-10-CM | POA: Diagnosis not present

## 2019-10-24 DIAGNOSIS — R0902 Hypoxemia: Secondary | ICD-10-CM | POA: Diagnosis not present

## 2019-10-24 DIAGNOSIS — Z85528 Personal history of other malignant neoplasm of kidney: Secondary | ICD-10-CM | POA: Diagnosis not present

## 2019-10-24 DIAGNOSIS — I959 Hypotension, unspecified: Secondary | ICD-10-CM | POA: Diagnosis not present

## 2019-10-24 DIAGNOSIS — Y92018 Other place in single-family (private) house as the place of occurrence of the external cause: Secondary | ICD-10-CM | POA: Diagnosis not present

## 2019-10-24 DIAGNOSIS — Z87891 Personal history of nicotine dependence: Secondary | ICD-10-CM | POA: Insufficient documentation

## 2019-10-24 DIAGNOSIS — Z96642 Presence of left artificial hip joint: Secondary | ICD-10-CM | POA: Insufficient documentation

## 2019-10-24 DIAGNOSIS — Z743 Need for continuous supervision: Secondary | ICD-10-CM | POA: Diagnosis not present

## 2019-10-24 DIAGNOSIS — S0990XA Unspecified injury of head, initial encounter: Secondary | ICD-10-CM | POA: Diagnosis present

## 2019-10-24 DIAGNOSIS — S0003XA Contusion of scalp, initial encounter: Secondary | ICD-10-CM | POA: Diagnosis not present

## 2019-10-24 DIAGNOSIS — W19XXXA Unspecified fall, initial encounter: Secondary | ICD-10-CM

## 2019-10-24 DIAGNOSIS — I10 Essential (primary) hypertension: Secondary | ICD-10-CM | POA: Diagnosis not present

## 2019-10-24 DIAGNOSIS — Y998 Other external cause status: Secondary | ICD-10-CM | POA: Diagnosis not present

## 2019-10-24 DIAGNOSIS — E119 Type 2 diabetes mellitus without complications: Secondary | ICD-10-CM | POA: Diagnosis not present

## 2019-10-24 LAB — CBC WITH DIFFERENTIAL/PLATELET
Abs Immature Granulocytes: 0.03 10*3/uL (ref 0.00–0.07)
Basophils Absolute: 0.1 10*3/uL (ref 0.0–0.1)
Basophils Relative: 1 %
Eosinophils Absolute: 0.4 10*3/uL (ref 0.0–0.5)
Eosinophils Relative: 4 %
HCT: 30.9 % — ABNORMAL LOW (ref 36.0–46.0)
Hemoglobin: 10.3 g/dL — ABNORMAL LOW (ref 12.0–15.0)
Immature Granulocytes: 0 %
Lymphocytes Relative: 19 %
Lymphs Abs: 2.1 10*3/uL (ref 0.7–4.0)
MCH: 24.9 pg — ABNORMAL LOW (ref 26.0–34.0)
MCHC: 33.3 g/dL (ref 30.0–36.0)
MCV: 74.6 fL — ABNORMAL LOW (ref 80.0–100.0)
Monocytes Absolute: 0.9 10*3/uL (ref 0.1–1.0)
Monocytes Relative: 8 %
Neutro Abs: 7.4 10*3/uL (ref 1.7–7.7)
Neutrophils Relative %: 68 %
Platelets: 316 10*3/uL (ref 150–400)
RBC: 4.14 MIL/uL (ref 3.87–5.11)
RDW: 15.8 % — ABNORMAL HIGH (ref 11.5–15.5)
WBC: 10.9 10*3/uL — ABNORMAL HIGH (ref 4.0–10.5)
nRBC: 0 % (ref 0.0–0.2)

## 2019-10-24 LAB — COMPREHENSIVE METABOLIC PANEL
ALT: 14 U/L (ref 0–44)
AST: 25 U/L (ref 15–41)
Albumin: 3.9 g/dL (ref 3.5–5.0)
Alkaline Phosphatase: 105 U/L (ref 38–126)
Anion gap: 12 (ref 5–15)
BUN: 15 mg/dL (ref 8–23)
CO2: 24 mmol/L (ref 22–32)
Calcium: 9.2 mg/dL (ref 8.9–10.3)
Chloride: 91 mmol/L — ABNORMAL LOW (ref 98–111)
Creatinine, Ser: 0.93 mg/dL (ref 0.44–1.00)
GFR calc Af Amer: >60 mL/min (ref >60)
GFR calc non Af Amer: 54 mL/min — ABNORMAL LOW (ref >60)
Glucose, Bld: 106 mg/dL — ABNORMAL HIGH (ref 70–99)
Potassium: 4.5 mmol/L (ref 3.5–5.1)
Sodium: 127 mmol/L — ABNORMAL LOW (ref 135–145)
Total Bilirubin: 0.6 mg/dL (ref 0.3–1.2)
Total Protein: 6.7 g/dL (ref 6.5–8.1)

## 2019-10-24 LAB — PROTIME-INR
INR: 2.6 — ABNORMAL HIGH (ref 0.8–1.2)
Prothrombin Time: 27.4 seconds — ABNORMAL HIGH (ref 11.4–15.2)

## 2019-10-24 MED ORDER — LIDOCAINE-EPINEPHRINE (PF) 2 %-1:200000 IJ SOLN
10.0000 mL | Freq: Once | INTRAMUSCULAR | Status: AC
  Administered 2019-10-24: 10 mL

## 2019-10-24 NOTE — ED Notes (Signed)
Bleeding under control after MD sutured laceration. Wound left unwrapped at this time per Dr. Joni Fears.

## 2019-10-24 NOTE — ED Provider Notes (Signed)
[facility] Emergency Department Provider Note  ____________________________________________   First MD Initiated Contact with Patient 10/24/19 1149     (approximate)  I have reviewed the triage vital signs and the nursing notes.   HISTORY  Chief Complaint Head Injury    HPI KAYLYN GARROW is a 85 y.o. female presents emergency department after a fall.  Patient states that she was leaning over to get a sweater out of a laundry basket and fell backwards hitting her head.  She denies any back pain, hip pain or leg pain.  No wrist pain or arm pain.  Patient does take Coumadin.  She states she has bleeding from the back of her head.  She lives at [facility] which does not have skilled nursing care.  She denies any LOC.    Past Medical History:  Diagnosis Date   Atrial fibrillation ([place])    Bronchitis    Cancer ([place])    Left leg growth, kidneys, lungs and breasts   Carcinoma of unknown primary ([place])    COPD (chronic obstructive pulmonary disease) ([place])    Diabetes mellitus without complication ([place])    Hyperlipidemia    Hypertension    Pancreatitis    Pneumonia    Stroke ([place])    TIA's   Vitamin B12 deficiency     Patient Active Problem List   Diagnosis Date Noted   Encounter for general adult medical examination with abnormal findings 08/21/2019   Cellulitis of right buttock 07/25/2019   Diabetic neuropathy ([place]) 06/25/2019   Type 2 diabetes mellitus with vascular disease ([place]) 06/25/2019   Pressure injury of skin 06/05/2019   'light-for-dates' infant with signs of fetal malnutrition 05/31/2019   Closed left hip fracture ([place]) 05/31/2019   Inflammatory polyarthritis ([place]) 05/09/2019   Deep vein thrombosis (DVT) ([place]) 11/29/2018   Edema of left lower extremity 11/13/2018   Cellulitis of left lower leg 11/01/2018   Encounter for therapeutic drug level monitoring 07/05/2018   Allergic contact dermatitis 04/19/2018    Atopic dermatitis 03/15/2018   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[place]) 03/15/2018   Atrial fibrillation ([place]) 02/12/2018   Melena    Urinary tract infection without hematuria 01/04/2018   Chronic obstructive pulmonary disease ([place]) 12/27/2017   Acute cystitis with hematuria 12/27/2017   Dysuria 12/27/2017   Urinary frequency 12/27/2017   Uncontrolled type 2 diabetes mellitus with hyperglycemia ([place]) 12/27/2017   Iron deficiency anemia due to chronic blood loss 09/28/2016   Avitaminosis D 09/09/2016   B12 deficiency 09/09/2016   Temporary cerebral vascular dysfunction 09/09/2016   Spinal stenosis 09/09/2016   Pain in shoulder 09/09/2016   Restless legs syndrome 09/09/2016   Personal history of urinary calculi 09/09/2016   H/O deep venous thrombosis 09/09/2016   Gout 09/09/2016   Accumulation of fluid in tissues 09/09/2016   Carpal tunnel syndrome 09/09/2016   Chronic lung disease 09/09/2016   Cataract 09/09/2016   Appendicular ataxia 09/09/2016   Airway hyperreactivity 09/09/2016   Rectal bleeding    Anemia due to blood loss, acute    Gastrointestinal hemorrhage 08/08/2016   Carcinoma of unknown primary ([place]) 08/02/2016   Diabetes ([place]) 05/08/2015   Arthropathy of hand 02/04/2010   Hypertonicity of bladder 08/31/2009   [place]or instability of bladder 06/18/2009   Pure hypercholesterolemia 04/10/2009   Essential hypertension 04/10/2009   Diverticulitis of colon 04/10/2009    Past Surgical History:  Procedure Laterality Date   [place]  COLONOSCOPY     COLONOSCOPY WITH PROPOFOL N/A 01/25/2018   Procedure: COLONOSCOPY WITH PROPOFOL;  Surgeon: [name], MD;  Location: [facility] ENDOSCOPY;  Service: Endoscopy;  Laterality: N/A;   ESOPHAGOGASTRODUODENOSCOPY (EGD) WITH PROPOFOL N/A 01/24/2018   Procedure: ESOPHAGOGASTRODUODENOSCOPY (EGD) WITH PROPOFOL;  Surgeon: [name], MD;  Location[facility]  ENDOSCOPY;  Service: Endoscopy;  Laterality: N/A;   HIP ARTHROPLASTY Left 06/03/2019   Procedure: ARTHROPLASTY BIPOLAR HIP (HEMIARTHROPLASTY);  Surgeon: Dereck Leep, MD;  Location: [facility] ORS;  Service: Orthopedics;  Laterality: Left;    Prior to Admission medications   Medication Sig Start Date End Date Taking? Authorizing Provider  albuterol (VENTOLIN HFA) 108 (90 Base) MCG/ACT inhaler Inhale 2 puffs into the lungs every 4 (four) hours as needed for wheezing or shortness of breath. 03/06/19   [name], NP  amLODipine-benazepril (LOTREL) 5-20 MG capsule  06/18/19   [provider]  b complex vitamins capsule Take 1 capsule by mouth daily.    [provider]  desloratadine (CLARINEX) 5 MG tablet Take 1 tablet (5 mg total) by mouth daily. 07/19/19   [name], NP  doxycycline (VIBRA-TABS) 100 MG tablet Take 1 tablet (100 mg total) by mouth 2 (two) times daily. 07/18/19   [name], NP  famotidine (PEPCID) 20 MG tablet Take 1 tablet (20 mg total) by mouth 2 (two) times daily. 08/11/16   Epifanio Lesches, MD  fluticasone furoate-vilanterol (BREO ELLIPTA) 100-25 MCG/INH AEPB TAKE 1 PUFF ONCE A DAY 09/14/19   Boscia, Greer Ee, NP  furosemide (LASIX) 20 MG tablet Take 20 mg by mouth daily.  05/24/16   [provider]  gabapentin (NEURONTIN) 300 MG capsule Take 2 capsules (600 mg total) by mouth at bedtime. 09/14/19   [name], NP  glipiZIDE (GLUCOTROL XL) 2.5 MG 24 hr tablet Take 1 tablet (2.5 mg total) by mouth daily. 04/18/19   [name], NP  glucose blood (ONETOUCH ULTRA) test strip 1 each by Other route daily. Use as instructed 07/26/19   [name], NP  hydrochlorothiazide (HYDRODIURIL) 25 MG tablet  06/18/19   [provider]  levofloxacin (LEVAQUIN) 500 MG tablet Take 1 tablet (500 mg total) by mouth daily. 08/24/19   [name], NP  Liniments (SALONPAS PAIN RELIEF PATCH EX) Apply topically.    [provider]  Magnesium 200 MG TABS Take 1 tablet (200 mg total) by mouth daily. 05/07/19   [name], NP  metFORMIN (GLUCOPHAGE-XR) 500 MG 24 hr tablet Take 2 tablets (1,000 mg total) by mouth daily. 08/15/19   [name], NP  montelukast (SINGULAIR) 10 MG tablet Take 10 mg by mouth at bedtime.    [provider]  nystatin cream (MYCOSTATIN) Apply topically 2 (two) times daily. 07/18/19   [name], NP  oxybutynin (DITROPAN-XL) 10 MG 24 hr tablet Take 1 tablet (10 mg total) by mouth daily. 10/18/19   [name], NP  pravastatin (PRAVACHOL) 40 MG tablet Take 1 tablet (40 mg total) by mouth daily. 08/15/19   [name], NP  sodium chloride 1 g tablet Take 1 tablet (1 g total) by mouth 3 (three) times daily with meals. 06/09/19   Dustin Flock, MD  theophylline (THEO-24) 100 MG 24 hr capsule TAKE 1 CAPSULE BY MOUTH EVERY DAY 09/14/19   [name], NP  triamcinolone (KENALOG) 0.025 % cream Apply 1 application topically 2 (two) times daily. Ok to mix with topical  lotion and apply to all affected areas. 05/07/19   [name], NP  warfarin (COUMADIN) 4 MG tablet TAKE ONE TABLET EVERY DAY EXCEPT MONDAY TAKE 1/2 TABLET 10/18/19   [name], NP    Allergies Sulfa antibiotics, Celecoxib, Acetaminophen, Codeine, Lyrica [pregabalin], Penicillin g, and Petrolatum-zinc oxide  Family History  Problem Relation Age of Onset   Cancer Father    Diabetes Brother     Social History Social History   Tobacco Use   Smoking status: Former Smoker    Packs/day: 0.50    Years: 15.00    Pack years: 7.50    Types: Cigarettes    Quit date: 11/15/1988    Years since quitting: 30.9   Smokeless tobacco: Never Used  Substance Use Topics   Alcohol use: No    Alcohol/week: 0.0 standard drinks   Drug use: No    Review of Systems  Constitutional: No fever/chills Head: Positive head injury with laceration Eyes: No visual changes. ENT: No sore  throat. Respiratory: Denies cough Genitourinary: Negative for dysuria. Musculoskeletal: Negative for back pain. Skin: Negative for rash.    ____________________________________________   PHYSICAL EXAM:  VITAL SIGNS: ED Triage Vitals [10/24/19 1112]  Enc Vitals Group     BP (!) 143/62     Pulse Rate 92     Resp 20     Temp 97.8 F (36.6 C)     Temp Source Oral     SpO2 100 %     Weight 150 lb (68 kg)     Height 5' 4"  (1.626 m)     Head Circumference      Peak Flow      Pain Score 0     Pain Loc      Pain Edu?      Excl. in [place]?     Constitutional: Alert and oriented. Well appearing and in no acute distress. Eyes: Conjunctivae are normal.  Head: Laceration noted to the posterior scalp, arterial bleeding noted. Nose: No congestion/rhinnorhea. Mouth/Throat: Mucous membranes are moist.   Neck:  supple no lymphadenopathy noted Cardiovascular: Normal rate, regular rhythm. Heart sounds are normal Respiratory: Normal respiratory effort.  No retractions, lungs c t a  GU: deferred Musculoskeletal: FROM all extremities, warm and well perfused, hips are nontender, lower extremities are nontender, upper extremities are nontender Neurologic:  Normal speech and language.  Skin:  Skin is warm, dry  No rash noted. Psychiatric: Mood and affect are normal. Speech and behavior are normal.  ____________________________________________   LABS (all labs ordered are listed, but only abnormal results are displayed)  Labs Reviewed  COMPREHENSIVE METABOLIC PANEL - Abnormal; Notable for the following components:      Result Value   Sodium 127 (*)    Chloride 91 (*)    Glucose, Bld 106 (*)    GFR calc non Af Amer 54 (*)    All other components within normal limits  CBC WITH DIFFERENTIAL/PLATELET - Abnormal; Notable for the following components:   WBC 10.9 (*)    Hemoglobin 10.3 (*)    HCT 30.9 (*)    MCV 74.6 (*)    MCH 24.9 (*)    RDW 15.8 (*)    All other components within  normal limits  PROTIME-INR - Abnormal; Notable for the following components:   Prothrombin Time 27.4 (*)    INR 2.6 (*)    All other components within normal limits   ____________________________________________   ____________________________________________  RADIOLOGY  CT  of the head and C-spine are negative for any acute abnormality  ____________________________________________   PROCEDURES  Procedure(s) performed: See Dr. Jerene Canny procedure note for repair of the laceration   Procedures    ____________________________________________   INITIAL IMPRESSION / ASSESSMENT AND PLAN / ED COURSE  Pertinent labs & imaging results that were available during my care of the patient were reviewed by me and considered in my medical decision making (see chart for details).   Patient is 85 year old female presents emergency department with head injury.  Patient is on Coumadin.  CT the head and C-spine were ordered.  CT of the head and C-spine are negative for any acute abnormality.  The nurses noticed a arterial type bleed while they were cleaning the wound, Dr. Joni Fears in to assess patient.  He will do a repair.  Instructed to order CBC, met B, and INR for the patient.  Clinical Course as of Oct 23 1308  Wed Oct 24, 2019  1219 Patient seen with Ashok Cordia, PA.  Patient found to have pulsatile arterial bleeding from scalp wound.  This was cleaned and repaired immediately upon completion of CT scan.  Now hemostatic.  Will check labs and reassess.   [PS]  1240 Labs show chronic mild hyponatremia and therapeutic anticoagulation on Coumadin.  HPI consistent with a mechanical fall.  Stable for discharge to follow-up with primary care with return precautions for any recurrent bleeding, dizziness, severe headaches, or large scalp swelling.   [PS]    Clinical Course User Index [PS] Carrie Mew, MD    ----------------------------------------- 1:08 PM on  10/24/2019 -----------------------------------------  Labs are basically normal at her baseline.  The bleeding has been resolved.  There is a hematoma noted at the scalp.  Feel she is stable at this time and can return home.  EMS will be called for transportation.   MADELON WELSCH was evaluated in Emergency Department on 10/24/2019 for the symptoms described in the history of present illness. She was evaluated in the context of the global COVID-19 pandemic, which necessitated consideration that the patient might be at risk for infection with the SARS-CoV-2 virus that causes COVID-19. Institutional protocols and algorithms that pertain to the evaluation of patients at risk for COVID-19 are in a state of rapid change based on information released by regulatory bodies including the CDC and federal and state organizations. These policies and algorithms were followed during the patient's care in the ED.   As part of my medical decision making, I reviewed the following data within the [facility] notes reviewed and incorporated, Labs reviewed , Old chart reviewed, Radiograph reviewed , Evaluated by EM attending Dr. Joni Fears, Notes from prior ED visits and  Controlled Substance Database  ____________________________________________   FINAL CLINICAL IMPRESSION(S) / ED DIAGNOSES  Final diagnoses:  Laceration of scalp, initial encounter  Injury of head, initial encounter  Fall, initial encounter      NEW MEDICATIONS STARTED DURING THIS VISIT:  New Prescriptions   No medications on file     Note:  This document was prepared using Dragon voice recognition software and may include unintentional dictation errors.    Versie Starks, PA-C 10/24/19 1309    Carrie Mew, MD 10/24/19 1311

## 2019-10-24 NOTE — ED Provider Notes (Signed)
..  Laceration Repair  Date/Time: 10/24/2019 1:00 PM Performed by: Carrie Mew, MD Authorized by: Carrie Mew, MD   Consent:    Consent obtained:  Verbal   Consent given by:  Patient   Risks discussed:  Infection, pain, retained foreign body, poor cosmetic result, poor wound healing, need for additional repair and vascular damage Anesthesia (see MAR for exact dosages):    Anesthesia method:  Local infiltration   Local anesthetic:  Lidocaine 1% WITH epi Laceration details:    Location:  Scalp   Scalp location:  R parietal   Length (cm):  3 Repair type:    Repair type:  Intermediate Pre-procedure details:    Preparation:  Patient was prepped and draped in usual sterile fashion and imaging obtained to evaluate for foreign bodies Exploration:    Hemostasis achieved with:  Direct pressure   Wound exploration: entire depth of wound probed and visualized     Wound extent: vascular damage     Wound extent comment:  Pulsatile bleeding from small cutaneous artery   Contaminated: no   Treatment:    Area cleansed with:  Saline and Betadine   Amount of cleaning:  Extensive   Irrigation solution:  Sterile saline   Irrigation method:  Pressure wash   Visualized foreign bodies/material removed: no   Skin repair:    Repair method:  Sutures   Suture size:  4-0   Wound skin closure material used: Monocryl.   Suture technique:  Running and figure eight   Number of sutures:  7 Approximation:    Approximation:  Close Post-procedure details:    Dressing:  Sterile dressing   Patient tolerance of procedure:  Tolerated well, no immediate complications Comments:     Hemostasis obtained by figure-of-eight suturing of bleeding location and wound closure.  Persistent small scalp hematoma present    Clinical Course as of Oct 23 1302  Wed Oct 24, 2019  1219 Patient seen with Ashok Cordia, PA.  Patient found to have pulsatile arterial bleeding from scalp wound.  This was cleaned and  repaired immediately upon completion of CT scan.  Now hemostatic.  Will check labs and reassess.   [PS]  1240 Labs show chronic mild hyponatremia and therapeutic anticoagulation on Coumadin.  HPI consistent with a mechanical fall.  Stable for discharge to follow-up with primary care with return precautions for any recurrent bleeding, dizziness, severe headaches, or large scalp swelling.   [PS]    Clinical Course User Index [PS] Carrie Mew, MD    Final diagnoses:  Laceration of scalp, initial encounter        Carrie Mew, MD 10/24/19 1304

## 2019-10-24 NOTE — ED Notes (Signed)
Report given to Richardson Landry at [facility].

## 2019-10-24 NOTE — ED Notes (Addendum)
Pt back to treatment room from CT. No neuro changes noted and NAD noted. Pt head cleaned for wound assessment. Upon assessment has swelling to the laceration and pulsating blood flow. NP brought to bed side and MD notified of arterial bleed. Pressure dressing applied until MD assessment.

## 2019-10-24 NOTE — ED Notes (Signed)
Head wrapped with gauze and pressure wrapping.

## 2019-10-24 NOTE — ED Triage Notes (Signed)
Pt to ED via [facility] from [facility] for chief complaint of head injury/lacteration from a fall. Pt denies LOC.  Pt oriented to person, place and situation; disoriented to time.  EMS reports pt taking blood thinners.  Pt in NAD at this time.  Pt has head wrapped at this time by EMS

## 2019-10-24 NOTE — ED Notes (Signed)
Patient transported to CT 

## 2019-10-24 NOTE — Discharge Instructions (Addendum)
Follow-up with your regular doctor in 2 days for recheck.  Suture should be removed in 10 days.  Return if worsening.  Apply ice to the scalp.

## 2019-10-24 NOTE — ED Notes (Signed)
Head rewrapped.  No bleeding seen from laceration.  AAOx3.  Skin warm and dry. NAD

## 2019-10-24 NOTE — ED Notes (Signed)
Attempted to call patient's daughter, [name], to update with no answer.

## 2019-10-26 ENCOUNTER — Encounter: Payer: Self-pay | Admitting: Nurse Practitioner

## 2019-10-26 ENCOUNTER — Ambulatory Visit (INDEPENDENT_AMBULATORY_CARE_PROVIDER_SITE_OTHER): Payer: Medicare Other | Admitting: Nurse Practitioner

## 2019-10-26 ENCOUNTER — Other Ambulatory Visit: Payer: Self-pay

## 2019-10-26 VITALS — BP 146/66 | HR 82 | Temp 97.4°F | Resp 16 | Ht 64.0 in | Wt 146.6 lb

## 2019-10-26 DIAGNOSIS — Z7901 Long term (current) use of anticoagulants: Secondary | ICD-10-CM | POA: Diagnosis not present

## 2019-10-26 DIAGNOSIS — R252 Cramp and spasm: Secondary | ICD-10-CM | POA: Insufficient documentation

## 2019-10-26 DIAGNOSIS — N390 Urinary tract infection, site not specified: Secondary | ICD-10-CM | POA: Diagnosis not present

## 2019-10-26 DIAGNOSIS — S0101XD Laceration without foreign body of scalp, subsequent encounter: Secondary | ICD-10-CM | POA: Insufficient documentation

## 2019-10-26 DIAGNOSIS — R319 Hematuria, unspecified: Secondary | ICD-10-CM | POA: Diagnosis not present

## 2019-10-26 MED ORDER — MAGNESIUM 200 MG PO TABS: 1.0000 | ORAL_TABLET | Freq: Every day | ORAL | 5 refills | Status: DC

## 2019-10-26 MED ORDER — LEVOFLOXACIN 500 MG PO TABS: 500.0000 mg | ORAL_TABLET | Freq: Every day | ORAL | 0 refills | Status: DC

## 2019-10-26 NOTE — Progress Notes (Signed)
[facility] [place], [place] [address]  Internal MEDICINE  Office Visit Note  Patient Name: Monica [name]  [id number]  OI:[id number]  Date of Service: 10/26/2019  Chief Complaint  Patient presents with  . Hospitalization Follow-up    head laceration, concerned with anticoagulant use    The patient is here for ER visit follow up. [place] in her home on Wednesday, hitting her head. Had arterial lesion to the back of her scalp. She has seven stitches. They will need to be removed next week at week's end. She did have a CT scan which was negative for acute issues. She is on warfarin. Her INR at the hospital at the time of her visit was 2.6. she continues to take the same dose of warfarin. Today, she states that head is tender. She is unable to move her head in certain directions without pain. Unable to lay on the right side of her head due to the scalp laceration and repair. The PA who saw the patient in the ER felt she might need antibiotic, but wanted her to be seen by PCP.       Current Medication: Outpatient Encounter Medications as of 10/26/2019  Medication Sig  . albuterol (VENTOLIN HFA) 108 (90 Base) MCG/ACT inhaler Inhale 2 puffs into the lungs every 4 (four) hours as needed for wheezing or shortness of breath.  Marland Kitchen amLODipine-benazepril (LOTREL) 5-20 MG capsule   . b complex vitamins capsule Take 1 capsule by mouth daily.  Marland Kitchen desloratadine (CLARINEX) 5 MG tablet Take 1 tablet (5 mg total) by mouth daily.  Marland Kitchen doxycycline (VIBRA-TABS) 100 MG tablet Take 1 tablet (100 mg total) by mouth 2 (two) times daily.  . famotidine (PEPCID) 20 MG tablet Take 1 tablet (20 mg total) by mouth 2 (two) times daily.  . fluticasone furoate-vilanterol (BREO ELLIPTA) 100-25 MCG/INH AEPB TAKE 1 PUFF ONCE A DAY  . furosemide (LASIX) 20 MG tablet Take 20 mg by mouth daily.   Marland Kitchen gabapentin (NEURONTIN) 300 MG capsule Take 2 capsules (600 mg total) by mouth at bedtime.  Marland Kitchen glipiZIDE  (GLUCOTROL XL) 2.5 MG 24 hr tablet Take 1 tablet (2.5 mg total) by mouth daily.  Marland Kitchen glucose blood (ONETOUCH ULTRA) test strip 1 each by Other route daily. Use as instructed  . hydrochlorothiazide (HYDRODIURIL) 25 MG tablet   . levofloxacin (LEVAQUIN) 500 MG tablet Take 1 tablet (500 mg total) by mouth daily.  . Liniments (SALONPAS PAIN RELIEF PATCH EX) Apply topically.  . Magnesium 200 MG TABS Take 1 tablet (200 mg total) by mouth daily.  . metFORMIN (GLUCOPHAGE-XR) 500 MG 24 hr tablet Take 2 tablets (1,000 mg total) by mouth daily.  . montelukast (SINGULAIR) 10 MG tablet Take 10 mg by mouth at bedtime.  Marland Kitchen nystatin cream (MYCOSTATIN) Apply topically 2 (two) times daily.  Marland Kitchen oxybutynin (DITROPAN-XL) 10 MG 24 hr tablet Take 1 tablet (10 mg total) by mouth daily.  . pravastatin (PRAVACHOL) 40 MG tablet Take 1 tablet (40 mg total) by mouth daily.  . sodium chloride 1 g tablet Take 1 tablet (1 g total) by mouth 3 (three) times daily with meals.  . theophylline (THEO-24) 100 MG 24 hr capsule TAKE 1 CAPSULE BY MOUTH EVERY DAY  . triamcinolone (KENALOG) 0.025 % cream Apply 1 application topically 2 (two) times daily. Ok to mix with topical lotion and apply to all affected areas.  . warfarin (COUMADIN) 4 MG tablet TAKE ONE TABLET EVERY DAY EXCEPT MONDAY TAKE  1/2 TABLET  . [DISCONTINUED] levofloxacin (LEVAQUIN) 500 MG tablet Take 1 tablet (500 mg total) by mouth daily.  . [DISCONTINUED] Magnesium 200 MG TABS Take 1 tablet (200 mg total) by mouth daily.   No facility-administered encounter medications on file as of 10/26/2019.    Surgical History: Past Surgical History:  Procedure Laterality Date  . ABDOMINAL HYSTERECTOMY  1975  . BLADDER SUSPENSION  1989  . COLONOSCOPY    . COLONOSCOPY WITH PROPOFOL N/A 01/25/2018   Procedure: COLONOSCOPY WITH PROPOFOL;  Surgeon: [name], MD;  Location: [facility] ENDOSCOPY;  Service: Endoscopy;  Laterality: N/A;  . ESOPHAGOGASTRODUODENOSCOPY (EGD) WITH PROPOFOL N/A  01/24/2018   Procedure: ESOPHAGOGASTRODUODENOSCOPY (EGD) WITH PROPOFOL;  Surgeon: [name], MD;  Location: [facility] ENDOSCOPY;  Service: Endoscopy;  Laterality: N/A;  . HIP ARTHROPLASTY Left 06/03/2019   Procedure: ARTHROPLASTY BIPOLAR HIP (HEMIARTHROPLASTY);  Surgeon: Dereck Leep, MD;  Location: [facility] ORS;  Service: Orthopedics;  Laterality: Left;    Medical History: Past Medical History:  Diagnosis Date  . Atrial fibrillation ([place])   . Bronchitis   . Cancer ([place])    Left leg growth, kidneys, lungs and breasts  . Carcinoma of unknown primary ([place])   . COPD (chronic obstructive pulmonary disease) ([place])   . Diabetes mellitus without complication ([place])   . Hyperlipidemia   . Hypertension   . Pancreatitis   . Pneumonia   . Stroke ([place])    TIA's  . Vitamin B12 deficiency     Family History: Family History  Problem Relation Age of Onset  . Cancer Father   . Diabetes Brother     Social History   Socioeconomic History  . Marital status: Divorced    Spouse name: Not on file  . Number of children: Not on file  . Years of education: Not on file  . Highest education level: Not on file  Occupational History  . Not on file  Tobacco Use  . Smoking status: Former Smoker    Packs/day: 0.50    Years: 15.00    Pack years: 7.50    Types: Cigarettes    Quit date: 11/15/1988    Years since quitting: 30.9  . Smokeless tobacco: Never Used  Substance and Sexual Activity  . Alcohol use: No    Alcohol/week: 0.0 standard drinks  . Drug use: No  . Sexual activity: Not on file    Comment: hysterectomy   Other Topics Concern  . Not on file  Social History Narrative  . Not on file   Social Determinants of Health   Financial Resource Strain:   . Difficulty of Paying Living Expenses: Not on file  Food Insecurity:   . Worried About Charity fundraiser in the Last Year: Not on file  . Ran Out of Food in the Last Year: Not on file  Transportation Needs:   . Lack of Transportation  (Medical): Not on file  . Lack of Transportation (Non-Medical): Not on file  Physical Activity:   . Days of Exercise per Week: Not on file  . Minutes of Exercise per Session: Not on file  Stress:   . Feeling of Stress : Not on file  Social Connections:   . Frequency of Communication with Friends and Family: Not on file  . Frequency of Social Gatherings with Friends and Family: Not on file  . Attends Religious Services: Not on file  . Active Member of Clubs or Organizations: Not on file  . Attends Archivist Meetings:  Not on file  . Marital Status: Not on file  Intimate Partner Violence:   . Fear of Current or Ex-Partner: Not on file  . Emotionally Abused: Not on file  . Physically Abused: Not on file  . Sexually Abused: Not on file      Review of Systems  Constitutional: Negative for activity change, chills, fatigue and unexpected weight change.  HENT: Negative for congestion, postnasal drip, rhinorrhea, sneezing and sore throat.        Scalp laceration which was repaired in ER. She has seven stitches in her scalp.   Eyes: Negative for redness.  Respiratory: Negative for cough, chest tightness and shortness of breath.   Cardiovascular: Negative for chest pain and palpitations.  Gastrointestinal: Negative for abdominal pain, constipation, diarrhea, nausea and vomiting.  Genitourinary: Positive for dysuria, frequency and urgency.  Musculoskeletal: Negative for arthralgias, back pain, joint swelling and neck pain.  Skin: Negative for rash.  Neurological: Negative for dizziness, tremors, numbness and headaches.  Hematological: Negative for adenopathy. Does not bruise/bleed easily.  Psychiatric/Behavioral: Negative for behavioral problems (Depression), sleep disturbance and suicidal ideas. The patient is not nervous/anxious.     Today's Vitals   10/26/19 1349  BP: (!) 146/66  Pulse: 82  Resp: 16  Temp: (!) 97.4 F (36.3 C)  SpO2: 97%  Weight: 146 lb 9.6 oz (66.5  kg)  Height: 5\' 4"  (1.626 m)   Body mass index is 25.16 kg/m.  Physical Exam Vitals and nursing note reviewed.  Constitutional:      General: She is not in acute distress.    Appearance: Normal appearance. She is well-developed. She is not diaphoretic.  HENT:     Head: Normocephalic and atraumatic.     Comments: Scalp laceration on posterior aspect of head. There are seven stitches.  Wound is clean with dried blood around it. Small hematoma noted. There is mild redness and inflammation present.     Mouth/Throat:     Pharynx: No oropharyngeal exudate.  Eyes:     Pupils: Pupils are equal, round, and reactive to light.  Neck:     Thyroid: No thyromegaly.     Vascular: No JVD.     Trachea: No tracheal deviation.  Cardiovascular:     Rate and Rhythm: Normal rate and regular rhythm.     Heart sounds: Normal heart sounds. No murmur. No friction rub. No gallop.   Pulmonary:     Effort: Pulmonary effort is normal. No respiratory distress.     Breath sounds: Normal breath sounds. No wheezing or rales.  Chest:     Chest wall: No tenderness.  Abdominal:     Palpations: Abdomen is soft.  Musculoskeletal:        General: Normal range of motion.     Cervical back: Normal range of motion and neck supple.     Comments: Patient has hairline fracture of left ankle which is being followed per orthopedics.   Lymphadenopathy:     Cervical: No cervical adenopathy.  Skin:    General: Skin is warm and dry.  Neurological:     General: No focal deficit present.     Mental Status: She is alert and oriented to person, place, and time. Mental status is at baseline.     Cranial Nerves: No cranial nerve deficit.     Gait: Gait abnormal.  Psychiatric:        Behavior: Behavior normal.        Thought Content: Thought content normal.  Judgment: Judgment normal.    Assessment/Plan: 1. Scalp laceration, subsequent encounter Patient has scalp laceration to right posterior aspect of the head.  Seven stitches present. Need to be removed next week, end of the week.   2. Urinary tract infection with hematuria, site unspecified Treat with levofloxacin 500mg  daily for 14 days. Will adjust antibiotics as indicated  - levofloxacin (LEVAQUIN) 500 MG tablet; Take 1 tablet (500 mg total) by mouth daily.  Dispense: 14 tablet; Refill: 0  3. Muscle cramping - Magnesium 200 MG TABS; Take 1 tablet (200 mg total) by mouth daily.  Dispense: 30 tablet; Refill: 5  4. Long term current use of anticoagulant therapy INR Wednesday during ER visit was 2.6. continue current dose warfarin. Will recheck at next visit.   General Counseling: Yvonnia verbalizes understanding of the findings of todays visit and agrees with plan of treatment. I have discussed any further diagnostic evaluation that may be needed or ordered today. We also reviewed her medications today. she has been encouraged to call the office with any questions or concerns that should arise related to todays visit.   This patient was seen by [place] with Dr Lavera Guise as a part of collaborative care agreement  Meds ordered this encounter  Medications  . levofloxacin (LEVAQUIN) 500 MG tablet    Sig: Take 1 tablet (500 mg total) by mouth daily.    Dispense:  14 tablet    Refill:  0    Please deliver to patient today. Thanks.    Order Specific Question:   Supervising Provider    Answer:   Lavera Guise [id number]  . Magnesium 200 MG TABS    Sig: Take 1 tablet (200 mg total) by mouth daily.    Dispense:  30 tablet    Refill:  5    Order Specific Question:   Supervising Provider    Answer:   Lavera Guise [id number]    Time spent: 30 Minutes      Dr Lavera Guise Internal medicine

## 2019-10-29 ENCOUNTER — Telehealth: Payer: Self-pay

## 2019-10-29 NOTE — Telephone Encounter (Signed)
Confirmed appointment with patient. klh °

## 2019-10-31 ENCOUNTER — Ambulatory Visit (INDEPENDENT_AMBULATORY_CARE_PROVIDER_SITE_OTHER): Payer: Medicare Other | Admitting: Adult Health

## 2019-10-31 ENCOUNTER — Other Ambulatory Visit: Payer: Self-pay

## 2019-10-31 ENCOUNTER — Encounter: Payer: Self-pay | Admitting: Adult Health

## 2019-10-31 VITALS — BP 148/63 | HR 96 | Temp 97.4°F | Resp 16 | Ht 64.0 in | Wt 150.0 lb

## 2019-10-31 DIAGNOSIS — S0101XD Laceration without foreign body of scalp, subsequent encounter: Secondary | ICD-10-CM

## 2019-10-31 NOTE — Progress Notes (Signed)
[facility] [place], [place] [address]  Internal MEDICINE  Office Visit Note  Patient Name: Monica [name]  [id number]  OI:[id number]  Date of Service: 11/15/2019  Chief Complaint  Patient presents with  . Fall    check sutures in head possibly take out     HPI  PT is here for suture removal.  She fell and had a head laceration about one week ago.  She is here to assess if the stitches can be removed. Upon inspection of her chart it seems dissolvable sutures were used. There are no visible sutures at this time.     Current Medication: Outpatient Encounter Medications as of 10/31/2019  Medication Sig  . albuterol (VENTOLIN HFA) 108 (90 Base) MCG/ACT inhaler Inhale 2 puffs into the lungs every 4 (four) hours as needed for wheezing or shortness of breath.  Marland Kitchen amLODipine-benazepril (LOTREL) 5-20 MG capsule   . b complex vitamins capsule Take 1 capsule by mouth daily.  Marland Kitchen desloratadine (CLARINEX) 5 MG tablet Take 1 tablet (5 mg total) by mouth daily.  Marland Kitchen doxycycline (VIBRA-TABS) 100 MG tablet Take 1 tablet (100 mg total) by mouth 2 (two) times daily.  . famotidine (PEPCID) 20 MG tablet Take 1 tablet (20 mg total) by mouth 2 (two) times daily.  . fluticasone furoate-vilanterol (BREO ELLIPTA) 100-25 MCG/INH AEPB TAKE 1 PUFF ONCE A DAY  . furosemide (LASIX) 20 MG tablet Take 20 mg by mouth daily.   Marland Kitchen gabapentin (NEURONTIN) 300 MG capsule Take 2 capsules (600 mg total) by mouth at bedtime.  Marland Kitchen glipiZIDE (GLUCOTROL XL) 2.5 MG 24 hr tablet Take 1 tablet (2.5 mg total) by mouth daily.  Marland Kitchen glucose blood (ONETOUCH ULTRA) test strip 1 each by Other route daily. Use as instructed  . hydrochlorothiazide (HYDRODIURIL) 25 MG tablet   . levofloxacin (LEVAQUIN) 500 MG tablet Take 1 tablet (500 mg total) by mouth daily.  . Liniments (SALONPAS PAIN RELIEF PATCH EX) Apply topically.  . Magnesium 200 MG TABS Take 1 tablet (200 mg total) by mouth daily.  . metFORMIN (GLUCOPHAGE-XR) 500  MG 24 hr tablet Take 2 tablets (1,000 mg total) by mouth daily.  . montelukast (SINGULAIR) 10 MG tablet Take 10 mg by mouth at bedtime.  Marland Kitchen nystatin cream (MYCOSTATIN) Apply topically 2 (two) times daily.  Marland Kitchen oxybutynin (DITROPAN-XL) 10 MG 24 hr tablet Take 1 tablet (10 mg total) by mouth daily.  . pravastatin (PRAVACHOL) 40 MG tablet Take 1 tablet (40 mg total) by mouth daily.  . sodium chloride 1 g tablet Take 1 tablet (1 g total) by mouth 3 (three) times daily with meals.  . theophylline (THEO-24) 100 MG 24 hr capsule TAKE 1 CAPSULE BY MOUTH EVERY DAY  . triamcinolone (KENALOG) 0.025 % cream Apply 1 application topically 2 (two) times daily. Ok to mix with topical lotion and apply to all affected areas.  . warfarin (COUMADIN) 4 MG tablet TAKE ONE TABLET EVERY DAY EXCEPT MONDAY TAKE 1/2 TABLET   No facility-administered encounter medications on file as of 10/31/2019.    Surgical History: Past Surgical History:  Procedure Laterality Date  . ABDOMINAL HYSTERECTOMY  1975  . BLADDER SUSPENSION  1989  . COLONOSCOPY    . COLONOSCOPY WITH PROPOFOL N/A 01/25/2018   Procedure: COLONOSCOPY WITH PROPOFOL;  Surgeon: [name], MD;  Location: [facility] ENDOSCOPY;  Service: Endoscopy;  Laterality: N/A;  . ESOPHAGOGASTRODUODENOSCOPY (EGD) WITH PROPOFOL N/A 01/24/2018   Procedure: ESOPHAGOGASTRODUODENOSCOPY (EGD) WITH PROPOFOL;  Surgeon: Allen Norris,  Darren, MD;  Location: [facility] ENDOSCOPY;  Service: Endoscopy;  Laterality: N/A;  . HIP ARTHROPLASTY Left 06/03/2019   Procedure: ARTHROPLASTY BIPOLAR HIP (HEMIARTHROPLASTY);  Surgeon: Dereck Leep, MD;  Location: [facility] ORS;  Service: Orthopedics;  Laterality: Left;    Medical History: Past Medical History:  Diagnosis Date  . Atrial fibrillation ([place])   . Bronchitis   . Cancer ([place])    Left leg growth, kidneys, lungs and breasts  . Carcinoma of unknown primary ([place])   . COPD (chronic obstructive pulmonary disease) ([place])   . Diabetes mellitus without complication  ([place])   . Hyperlipidemia   . Hypertension   . Pancreatitis   . Pneumonia   . Stroke ([place])    TIA's  . Vitamin B12 deficiency     Family History: Family History  Problem Relation Age of Onset  . Cancer Father   . Diabetes Brother     Social History   Socioeconomic History  . Marital status: Divorced    Spouse name: Not on file  . Number of children: Not on file  . Years of education: Not on file  . Highest education level: Not on file  Occupational History  . Not on file  Tobacco Use  . Smoking status: Former Smoker    Packs/day: 0.50    Years: 15.00    Pack years: 7.50    Types: Cigarettes    Quit date: 11/15/1988    Years since quitting: 31.0  . Smokeless tobacco: Never Used  Substance and Sexual Activity  . Alcohol use: No    Alcohol/week: 0.0 standard drinks  . Drug use: No  . Sexual activity: Not on file    Comment: hysterectomy   Other Topics Concern  . Not on file  Social History Narrative  . Not on file   Social Determinants of Health   Financial Resource Strain:   . Difficulty of Paying Living Expenses: Not on file  Food Insecurity:   . Worried About Charity fundraiser in the Last Year: Not on file  . Ran Out of Food in the Last Year: Not on file  Transportation Needs:   . Lack of Transportation (Medical): Not on file  . Lack of Transportation (Non-Medical): Not on file  Physical Activity:   . Days of Exercise per Week: Not on file  . Minutes of Exercise per Session: Not on file  Stress:   . Feeling of Stress : Not on file  Social Connections:   . Frequency of Communication with Friends and Family: Not on file  . Frequency of Social Gatherings with Friends and Family: Not on file  . Attends Religious Services: Not on file  . Active Member of Clubs or Organizations: Not on file  . Attends Archivist Meetings: Not on file  . Marital Status: Not on file  Intimate Partner Violence:   . Fear of Current or Ex-Partner: Not on file  .  Emotionally Abused: Not on file  . Physically Abused: Not on file  . Sexually Abused: Not on file      Review of Systems  Constitutional: Negative for chills, fatigue and unexpected weight change.  HENT: Negative for congestion, rhinorrhea, sneezing and sore throat.        Sutured head laceration  Eyes: Negative for photophobia, pain and redness.  Respiratory: Negative for cough, chest tightness and shortness of breath.   Cardiovascular: Negative for chest pain and palpitations.  Gastrointestinal: Negative for abdominal pain, constipation, diarrhea, nausea  and vomiting.  Endocrine: Negative.   Genitourinary: Negative for dysuria and frequency.  Musculoskeletal: Negative for arthralgias, back pain, joint swelling and neck pain.  Skin: Negative for rash.  Allergic/Immunologic: Negative.   Neurological: Negative for tremors and numbness.  Hematological: Negative for adenopathy. Does not bruise/bleed easily.  Psychiatric/Behavioral: Negative for behavioral problems and sleep disturbance. The patient is not nervous/anxious.     Vital Signs: BP (!) 148/63   Pulse 96   Temp (!) 97.4 F (36.3 C)   Resp 16   Ht 5\' 4"  (1.626 m)   Wt 150 lb (68 kg)   SpO2 98%   BMI 25.75 kg/m    Physical Exam Vitals and nursing note reviewed.  Constitutional:      General: She is not in acute distress.    Appearance: She is well-developed. She is not diaphoretic.  HENT:     Head: Normocephalic.     Comments: Sutured head lac in placed with small hematoma    Mouth/Throat:     Pharynx: No oropharyngeal exudate.  Eyes:     Pupils: Pupils are equal, round, and reactive to light.  Neck:     Thyroid: No thyromegaly.     Vascular: No JVD.     Trachea: No tracheal deviation.  Cardiovascular:     Rate and Rhythm: Normal rate and regular rhythm.     Heart sounds: Normal heart sounds. No murmur. No friction rub. No gallop.   Pulmonary:     Effort: Pulmonary effort is normal. No respiratory  distress.     Breath sounds: Normal breath sounds. No wheezing or rales.  Chest:     Chest wall: No tenderness.  Abdominal:     Palpations: Abdomen is soft.     Tenderness: There is no abdominal tenderness. There is no guarding.  Musculoskeletal:        General: Normal range of motion.     Cervical back: Normal range of motion and neck supple.  Lymphadenopathy:     Cervical: No cervical adenopathy.  Skin:    General: Skin is warm and dry.  Neurological:     Mental Status: She is alert and oriented to person, place, and time.     Cranial Nerves: No cranial nerve deficit.  Psychiatric:        Behavior: Behavior normal.        Thought Content: Thought content normal.        Judgment: Judgment normal.    Assessment/Plan: 1. Scalp laceration, subsequent encounter Pts wound looks good, well approximated. NO bleeding or Signs or symptoms of infection.  Sutures appears dissolvable, continue to monitor.   General Counseling: Ailey verbalizes understanding of the findings of todays visit and agrees with plan of treatment. I have discussed any further diagnostic evaluation that may be needed or ordered today. We also reviewed her medications today. she has been encouraged to call the office with any questions or concerns that should arise related to todays visit.    No orders of the defined types were placed in this encounter.   No orders of the defined types were placed in this encounter.   Time spent: 15  Minutes   This patient was seen by Orson Gear AGNP-C in Collaboration with Dr Lavera Guise as a part of collaborative care agreement     Kendell Bane AGNP-C Internal medicine

## 2019-11-01 ENCOUNTER — Ambulatory Visit (INDEPENDENT_AMBULATORY_CARE_PROVIDER_SITE_OTHER): Payer: Medicare Other

## 2019-11-01 DIAGNOSIS — Z7901 Long term (current) use of anticoagulants: Secondary | ICD-10-CM

## 2019-11-01 NOTE — Progress Notes (Signed)
Pt INR 3.1 continue same med as pres

## 2019-11-06 ENCOUNTER — Encounter: Payer: Self-pay | Admitting: Emergency Medicine

## 2019-11-06 ENCOUNTER — Emergency Department
Admission: EM | Admit: 2019-11-06 | Discharge: 2019-11-06 | Disposition: A | Discharge status: Home / Self Care | Payer: Medicare Other | Attending: Emergency Medicine | Admitting: Emergency Medicine

## 2019-11-06 ENCOUNTER — Other Ambulatory Visit: Payer: Self-pay

## 2019-11-06 DIAGNOSIS — Y9389 Activity, other specified: Secondary | ICD-10-CM | POA: Diagnosis not present

## 2019-11-06 DIAGNOSIS — E119 Type 2 diabetes mellitus without complications: Secondary | ICD-10-CM | POA: Diagnosis not present

## 2019-11-06 DIAGNOSIS — I10 Essential (primary) hypertension: Secondary | ICD-10-CM | POA: Insufficient documentation

## 2019-11-06 DIAGNOSIS — X58XXXA Exposure to other specified factors, initial encounter: Secondary | ICD-10-CM | POA: Diagnosis not present

## 2019-11-06 DIAGNOSIS — Z85528 Personal history of other malignant neoplasm of kidney: Secondary | ICD-10-CM | POA: Insufficient documentation

## 2019-11-06 DIAGNOSIS — Z7901 Long term (current) use of anticoagulants: Secondary | ICD-10-CM | POA: Diagnosis not present

## 2019-11-06 DIAGNOSIS — Z79899 Other long term (current) drug therapy: Secondary | ICD-10-CM | POA: Insufficient documentation

## 2019-11-06 DIAGNOSIS — Z96642 Presence of left artificial hip joint: Secondary | ICD-10-CM | POA: Diagnosis not present

## 2019-11-06 DIAGNOSIS — Z7984 Long term (current) use of oral hypoglycemic drugs: Secondary | ICD-10-CM | POA: Diagnosis not present

## 2019-11-06 DIAGNOSIS — Z85118 Personal history of other malignant neoplasm of bronchus and lung: Secondary | ICD-10-CM | POA: Insufficient documentation

## 2019-11-06 DIAGNOSIS — Z853 Personal history of malignant neoplasm of breast: Secondary | ICD-10-CM | POA: Diagnosis not present

## 2019-11-06 DIAGNOSIS — Y998 Other external cause status: Secondary | ICD-10-CM | POA: Insufficient documentation

## 2019-11-06 DIAGNOSIS — J449 Chronic obstructive pulmonary disease, unspecified: Secondary | ICD-10-CM | POA: Diagnosis not present

## 2019-11-06 DIAGNOSIS — R58 Hemorrhage, not elsewhere classified: Secondary | ICD-10-CM | POA: Diagnosis not present

## 2019-11-06 DIAGNOSIS — S0990XA Unspecified injury of head, initial encounter: Secondary | ICD-10-CM | POA: Diagnosis present

## 2019-11-06 DIAGNOSIS — W19XXXA Unspecified fall, initial encounter: Secondary | ICD-10-CM | POA: Diagnosis not present

## 2019-11-06 DIAGNOSIS — S0003XA Contusion of scalp, initial encounter: Secondary | ICD-10-CM | POA: Diagnosis not present

## 2019-11-06 DIAGNOSIS — Z743 Need for continuous supervision: Secondary | ICD-10-CM | POA: Diagnosis not present

## 2019-11-06 DIAGNOSIS — Z8673 Personal history of transient ischemic attack (TIA), and cerebral infarction without residual deficits: Secondary | ICD-10-CM | POA: Diagnosis not present

## 2019-11-06 DIAGNOSIS — R000 Tachycardia, unspecified: Secondary | ICD-10-CM | POA: Diagnosis not present

## 2019-11-06 DIAGNOSIS — Z87891 Personal history of nicotine dependence: Secondary | ICD-10-CM | POA: Diagnosis not present

## 2019-11-06 DIAGNOSIS — Y92129 Unspecified place in nursing home as the place of occurrence of the external cause: Secondary | ICD-10-CM | POA: Diagnosis not present

## 2019-11-06 DIAGNOSIS — I959 Hypotension, unspecified: Secondary | ICD-10-CM | POA: Diagnosis not present

## 2019-11-06 LAB — CBC WITH DIFFERENTIAL/PLATELET
Abs Immature Granulocytes: 0.02 10*3/uL (ref 0.00–0.07)
Basophils Absolute: 0.1 10*3/uL (ref 0.0–0.1)
Basophils Relative: 1 %
Eosinophils Absolute: 0.4 10*3/uL (ref 0.0–0.5)
Eosinophils Relative: 4 %
HCT: 28.6 % — ABNORMAL LOW (ref 36.0–46.0)
Hemoglobin: 9.2 g/dL — ABNORMAL LOW (ref 12.0–15.0)
Immature Granulocytes: 0 %
Lymphocytes Relative: 28 %
Lymphs Abs: 2.9 10*3/uL (ref 0.7–4.0)
MCH: 24.9 pg — ABNORMAL LOW (ref 26.0–34.0)
MCHC: 32.2 g/dL (ref 30.0–36.0)
MCV: 77.5 fL — ABNORMAL LOW (ref 80.0–100.0)
Monocytes Absolute: 0.9 10*3/uL (ref 0.1–1.0)
Monocytes Relative: 9 %
Neutro Abs: 6 10*3/uL (ref 1.7–7.7)
Neutrophils Relative %: 58 %
Platelets: 336 10*3/uL (ref 150–400)
RBC: 3.69 MIL/uL — ABNORMAL LOW (ref 3.87–5.11)
RDW: 16.1 % — ABNORMAL HIGH (ref 11.5–15.5)
WBC: 10.2 10*3/uL (ref 4.0–10.5)
nRBC: 0 % (ref 0.0–0.2)

## 2019-11-06 LAB — BASIC METABOLIC PANEL
Anion gap: 9 (ref 5–15)
BUN: 27 mg/dL — ABNORMAL HIGH (ref 8–23)
CO2: 26 mmol/L (ref 22–32)
Calcium: 9.2 mg/dL (ref 8.9–10.3)
Chloride: 97 mmol/L — ABNORMAL LOW (ref 98–111)
Creatinine, Ser: 1.23 mg/dL — ABNORMAL HIGH (ref 0.44–1.00)
GFR calc Af Amer: 44 mL/min — ABNORMAL LOW (ref >60)
GFR calc non Af Amer: 38 mL/min — ABNORMAL LOW (ref >60)
Glucose, Bld: 124 mg/dL — ABNORMAL HIGH (ref 70–99)
Potassium: 3.7 mmol/L (ref 3.5–5.1)
Sodium: 132 mmol/L — ABNORMAL LOW (ref 135–145)

## 2019-11-06 LAB — PROTIME-INR
INR: 2.4 — ABNORMAL HIGH (ref 0.8–1.2)
Prothrombin Time: 25.8 seconds — ABNORMAL HIGH (ref 11.4–15.2)

## 2019-11-06 MED ORDER — LACTATED RINGERS IV BOLUS
1000.0000 mL | Freq: Once | INTRAVENOUS | Status: AC
  Administered 2019-11-06: 1000 mL via INTRAVENOUS

## 2019-11-06 MED ORDER — BACITRACIN-NEOMYCIN-POLYMYXIN 400-5-5000 EX OINT
TOPICAL_OINTMENT | Freq: Once | CUTANEOUS | Status: AC
  Administered 2019-11-06: 1 via TOPICAL
  Filled 2019-11-06: qty 1

## 2019-11-06 NOTE — ED Notes (Signed)
Wound remains hemostatic. Pt given pillow

## 2019-11-06 NOTE — ED Notes (Signed)
Cleaned as much blood as possible from pt head/hair without disrupting coagulation of wound.  Clothes taken off and will send home with pt as they had blood all over them.

## 2019-11-06 NOTE — ED Notes (Signed)
Bacitracin with quickclot placed to head wound. Wrapped with coban.  Daughter to pick pt up

## 2019-11-06 NOTE — ED Triage Notes (Signed)
Pt arrived from [facility] via [facility] with a bleeding wound from a hematoma on the back of the head.

## 2019-11-06 NOTE — ED Notes (Signed)
Pt is claustrophobic and has repeatedly asked for her door to be kept open.

## 2019-11-06 NOTE — ED Provider Notes (Signed)
[facility] Emergency Department Provider Note   ____________________________________________   First MD Initiated Contact with Patient 11/06/19 1500     (approximate)  I have reviewed the triage vital signs and the nursing notes.   HISTORY  Chief Complaint bleeding wound    HPI Monica [name] is a 85 y.o. female with past medical history of atrial fibrillation on Coumadin, COPD, hypertension, diabetes who presents to the ED for bleeding wound.  Patient reports that she was wearing a hat earlier today to protect hematoma to her head that she suffered 2 weeks prior.  She suddenly noticed bleeding from the back of her head streaming down onto her sweater.  She denies any associated fall or pain and due to the degree of bleeding, EMS was called to [facility], where patient resides.  Patient reports that she initially fell 2 weeks ago onto the back of her head and had a bleeding wound then, that required suturing here in the ED.  She reportedly had a small area of arterial bleeding at that time, but had been doing well since then.  She does state that she was recently put on a course of antibiotics for a UTI, and this usually affects her INR.  She currently denies any other symptoms including fevers, cough, chest pain, shortness of breath, dysuria, or hematuria.        Past Medical History:  Diagnosis Date  . Atrial fibrillation ([place])   . Bronchitis   . Cancer ([place])    Left leg growth, kidneys, lungs and breasts  . Carcinoma of unknown primary ([place])   . COPD (chronic obstructive pulmonary disease) ([place])   . Diabetes mellitus without complication ([place])   . Hyperlipidemia   . Hypertension   . Pancreatitis   . Pneumonia   . Stroke ([place])    TIA's  . Vitamin B12 deficiency     Patient Active Problem List   Diagnosis Date Noted  . Scalp laceration, subsequent encounter 10/26/2019  . Muscle cramping 10/26/2019  . Long term current use of anticoagulant  therapy 10/26/2019  . Encounter for general adult medical examination with abnormal findings 08/21/2019  . Cellulitis of right buttock 07/25/2019  . Diabetic neuropathy ([place]) 06/25/2019  . Type 2 diabetes mellitus with vascular disease ([place]) 06/25/2019  . Pressure injury of skin 06/05/2019  . 'light-for-dates' infant with signs of fetal malnutrition 05/31/2019  . Closed left hip fracture ([place]) 05/31/2019  . Inflammatory polyarthritis ([place]) 05/09/2019  . Deep vein thrombosis (DVT) ([place]) 11/29/2018  . Edema of left lower extremity 11/13/2018  . Cellulitis of left lower leg 11/01/2018  . Encounter for therapeutic drug level monitoring 07/05/2018  . Allergic contact dermatitis 04/19/2018  . Atopic dermatitis 03/15/2018  . 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 Atrial fibrillation ([place]) 02/12/2018  . Melena   . Urinary tract infection with hematuria 01/04/2018  . Chronic obstructive pulmonary disease ([place]) 12/27/2017  . Acute cystitis with hematuria 12/27/2017  . Dysuria 12/27/2017  . Urinary frequency 12/27/2017  . Uncontrolled type 2 diabetes mellitus with hyperglycemia ([place]) 12/27/2017  . Iron deficiency anemia due to chronic blood loss 09/28/2016  . Avitaminosis D 09/09/2016  . B12 deficiency 09/09/2016  . Temporary cerebral vascular dysfunction 09/09/2016  . Spinal stenosis 09/09/2016  . Pain in shoulder 09/09/2016  . Restless legs syndrome 09/09/2016  . Personal history of urinary calculi 09/09/2016  . H/O deep venous thrombosis 09/09/2016  . Gout 09/09/2016  . Accumulation of fluid  in tissues 09/09/2016  . Carpal tunnel syndrome 09/09/2016  . Chronic lung disease 09/09/2016  . Cataract 09/09/2016  . Appendicular ataxia 09/09/2016  . Airway hyperreactivity 09/09/2016  . Rectal bleeding   . Anemia due to blood loss, acute   . Gastrointestinal hemorrhage 08/08/2016  . Carcinoma of unknown primary ([place]) 08/02/2016  . Diabetes ([place]) 05/08/2015  . Arthropathy of hand  02/04/2010  . 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 Detrusor instability of bladder 06/18/2009  . Pure hypercholesterolemia 04/10/2009  . Essential hypertension 04/10/2009  . Diverticulitis of colon 04/10/2009    Past Surgical History:  Procedure Laterality Date  . ABDOMINAL HYSTERECTOMY  1975  . BLADDER SUSPENSION  1989  . COLONOSCOPY    . COLONOSCOPY WITH PROPOFOL N/A 01/25/2018   Procedure: COLONOSCOPY WITH PROPOFOL;  Surgeon: [name], MD;  Location: [facility] ENDOSCOPY;  Service: Endoscopy;  Laterality: N/A;  . ESOPHAGOGASTRODUODENOSCOPY (EGD) WITH PROPOFOL N/A 01/24/2018   Procedure: ESOPHAGOGASTRODUODENOSCOPY (EGD) WITH PROPOFOL;  Surgeon: [name], MD;  Location: [facility] ENDOSCOPY;  Service: Endoscopy;  Laterality: N/A;  . HIP ARTHROPLASTY Left 06/03/2019   Procedure: ARTHROPLASTY BIPOLAR HIP (HEMIARTHROPLASTY);  Surgeon: Dereck Leep, MD;  Location: [facility] ORS;  Service: Orthopedics;  Laterality: Left;    Prior to Admission medications   Medication Sig Start Date End Date Taking? Authorizing Provider  albuterol (VENTOLIN HFA) 108 (90 Base) MCG/ACT inhaler Inhale 2 puffs into the lungs every 4 (four) hours as needed for wheezing or shortness of breath. 03/06/19   [name], NP  amLODipine-benazepril (LOTREL) 5-20 MG capsule  06/18/19   [provider]  b complex vitamins capsule Take 1 capsule by mouth daily.    [provider]  desloratadine (CLARINEX) 5 MG tablet Take 1 tablet (5 mg total) by mouth daily. 07/19/19   [name], NP  doxycycline (VIBRA-TABS) 100 MG tablet Take 1 tablet (100 mg total) by mouth 2 (two) times daily. 07/18/19   [name], NP  famotidine (PEPCID) 20 MG tablet Take 1 tablet (20 mg total) by mouth 2 (two) times daily. 08/11/16   Epifanio Lesches, MD  fluticasone furoate-vilanterol (BREO ELLIPTA) 100-25 MCG/INH AEPB TAKE 1 PUFF ONCE A DAY 09/14/19   Boscia, Greer Ee, NP  furosemide (LASIX) 20 MG tablet Take 20 mg by  mouth daily.  05/24/16   [provider]  gabapentin (NEURONTIN) 300 MG capsule Take 2 capsules (600 mg total) by mouth at bedtime. 09/14/19   [name], NP  glipiZIDE (GLUCOTROL XL) 2.5 MG 24 hr tablet Take 1 tablet (2.5 mg total) by mouth daily. 04/18/19   [name], NP  glucose blood (ONETOUCH ULTRA) test strip 1 each by Other route daily. Use as instructed 07/26/19   [name], NP  hydrochlorothiazide (HYDRODIURIL) 25 MG tablet  06/18/19   [provider]  levofloxacin (LEVAQUIN) 500 MG tablet Take 1 tablet (500 mg total) by mouth daily. 10/26/19   [name], NP  Liniments (SALONPAS PAIN RELIEF PATCH EX) Apply topically.    [provider]  Magnesium 200 MG TABS Take 1 tablet (200 mg total) by mouth daily. 10/26/19   [name], NP  metFORMIN (GLUCOPHAGE-XR) 500 MG 24 hr tablet Take 2 tablets (1,000 mg total) by mouth daily. 08/15/19   [name], NP  montelukast (SINGULAIR) 10 MG tablet Take 10 mg by mouth at bedtime.    [provider]  nystatin cream (MYCOSTATIN) Apply topically 2 (two) times daily.  07/18/19   [name], NP  oxybutynin (DITROPAN-XL) 10 MG 24 hr tablet Take 1 tablet (10 mg total) by mouth daily. 10/18/19   [name], NP  pravastatin (PRAVACHOL) 40 MG tablet Take 1 tablet (40 mg total) by mouth daily. 08/15/19   [name], NP  sodium chloride 1 g tablet Take 1 tablet (1 g total) by mouth 3 (three) times daily with meals. 06/09/19   Dustin Flock, MD  theophylline (THEO-24) 100 MG 24 hr capsule TAKE 1 CAPSULE BY MOUTH EVERY DAY 09/14/19   [name], NP  triamcinolone (KENALOG) 0.025 % cream Apply 1 application topically 2 (two) times daily. Ok to mix with topical lotion and apply to all affected areas. 05/07/19   [name], NP  warfarin (COUMADIN) 4 MG tablet TAKE ONE TABLET EVERY DAY EXCEPT MONDAY TAKE 1/2 TABLET 10/18/19   [name], NP    Allergies Sulfa  antibiotics, Celecoxib, Acetaminophen, Codeine, Lyrica [pregabalin], Penicillin g, and Petrolatum-zinc oxide  Family History  Problem Relation Age of Onset  . Cancer Father   . Diabetes Brother     Social History Social History   Tobacco Use  . Smoking status: Former Smoker    Packs/day: 0.50    Years: 15.00    Pack years: 7.50    Types: Cigarettes    Quit date: 11/15/1988    Years since quitting: 30.9  . Smokeless tobacco: Never Used  Substance Use Topics  . Alcohol use: No    Alcohol/week: 0.0 standard drinks  . Drug use: No    Review of Systems  Constitutional: No fever/chills Eyes: No visual changes. ENT: No sore throat. Cardiovascular: Denies chest pain. Respiratory: Denies shortness of breath. Gastrointestinal: No abdominal pain.  No nausea, no vomiting.  No diarrhea.  No constipation. Genitourinary: Negative for dysuria. Musculoskeletal: Negative for back pain. Skin: Negative for rash.  Positive for bleeding wound. Neurological: Negative for headaches, focal weakness or numbness.  ____________________________________________   PHYSICAL EXAM:  VITAL SIGNS: ED Triage Vitals  Enc Vitals Group     BP 11/06/19 1454 (!) 87/58     Pulse Rate 11/06/19 1454 (!) 108     Resp 11/06/19 1454 18     Temp 11/06/19 1454 98.1 F (36.7 C)     Temp Source 11/06/19 1454 Oral     SpO2 11/06/19 1454 96 %     Weight 11/06/19 1455 150 lb (68 kg)     Height 11/06/19 1455 5\' 4"  (1.626 m)     Head Circumference --      Peak Flow --      Pain Score --      Pain Loc --      Pain Edu? --      Excl. in [place]? --     Constitutional: Alert and oriented. Eyes: Conjunctivae are normal. Head: Small hematoma to posterior scalp with signs of significant bleeding but no active bleeding at this time.  No obvious laceration. Nose: No congestion/rhinnorhea. Mouth/Throat: Mucous membranes are moist. Neck: Normal ROM Cardiovascular: Normal rate, regular rhythm. Grossly normal heart  sounds. Respiratory: Normal respiratory effort.  No retractions. Lungs CTAB. Gastrointestinal: Soft and nontender. No distention. Genitourinary: deferred Musculoskeletal: No lower extremity tenderness nor edema. Neurologic:  Normal speech and language. No gross focal neurologic deficits are appreciated. Skin:  Skin is warm, dry and intact. No rash noted. Psychiatric: Mood and affect are normal. Speech and behavior are normal.  ____________________________________________   LABS (  all labs ordered are listed, but only abnormal results are displayed)  Labs Reviewed  BASIC METABOLIC PANEL - Abnormal; Notable for the following components:      Result Value   Sodium 132 (*)    Chloride 97 (*)    Glucose, Bld 124 (*)    BUN 27 (*)    Creatinine, Ser 1.23 (*)    GFR calc non Af Amer 38 (*)    GFR calc Af Amer 44 (*)    All other components within normal limits  CBC WITH DIFFERENTIAL/PLATELET - Abnormal; Notable for the following components:   RBC 3.69 (*)    Hemoglobin 9.2 (*)    HCT 28.6 (*)    MCV 77.5 (*)    MCH 24.9 (*)    RDW 16.1 (*)    All other components within normal limits  PROTIME-INR - Abnormal; Notable for the following components:   Prothrombin Time 25.8 (*)    INR 2.4 (*)    All other components within normal limits     PROCEDURES  Procedure(s) performed (including Critical Care):  Procedures  ED ECG REPORT I, Blake Divine, the attending physician, personally viewed and interpreted this ECG.   Date: 11/06/2019  EKG Time: 15:20  Rate: 92  Rhythm: normal sinus rhythm  Axis: LAD  Intervals:left anterior fascicular block  ST&T Change: None  ____________________________________________   INITIAL IMPRESSION / ASSESSMENT AND PLAN / ED COURSE       85 year old female with history of A. fib on Coumadin presents to the ED following spontaneous bleeding from hematoma to her scalp that required repair of arterial bleeding 2 weeks ago after a fall.  She  had a punctate area of bleeding over the hematoma that appeared consistent with recurrent arterial bleeding.  Quick clot was applied immediately along with pressure, bleeding subsequently stopped.  We will check CBC, BMP, and INR.  She has a borderline low blood pressure, will give fluid bolus and continue to monitor although there is not appear to be any signs of infection or sepsis.  I also doubt hypotension is from blood loss given relatively minor amount of blood loss and no ongoing bleeding.  Patient's blood pressure rose and remains significantly improved following IV fluid bolus.  Over approximately 3 hours of observation she had no recurrence of bleeding.  Area of dried blood was cleaned and dressing placed with wrap around patient's head.  Lab work is reassuring, INR is therapeutic.  I have counseled patient to follow-up with her PCP and otherwise to return to the ED for worsening bleeding.  Patient agrees with plan.      ____________________________________________   FINAL CLINICAL IMPRESSION(S) / ED DIAGNOSES  Final diagnoses:  Hematoma of scalp, initial encounter     ED Discharge Orders    None       Note:  This document was prepared using Dragon voice recognition software and may include unintentional dictation errors.   Blake Divine, MD 11/06/19 [phone]

## 2019-11-08 ENCOUNTER — Encounter: Payer: Self-pay | Admitting: Nurse Practitioner

## 2019-11-08 ENCOUNTER — Ambulatory Visit (INDEPENDENT_AMBULATORY_CARE_PROVIDER_SITE_OTHER): Payer: Medicare Other | Admitting: Nurse Practitioner

## 2019-11-08 ENCOUNTER — Other Ambulatory Visit: Payer: Self-pay

## 2019-11-08 VITALS — Ht 64.0 in

## 2019-11-08 DIAGNOSIS — E1159 Type 2 diabetes mellitus with other circulatory complications: Secondary | ICD-10-CM | POA: Diagnosis not present

## 2019-11-08 DIAGNOSIS — S0101XD Laceration without foreign body of scalp, subsequent encounter: Secondary | ICD-10-CM

## 2019-11-08 DIAGNOSIS — I8290 Acute embolism and thrombosis of unspecified vein: Secondary | ICD-10-CM | POA: Diagnosis not present

## 2019-11-08 DIAGNOSIS — S0003XD Contusion of scalp, subsequent encounter: Secondary | ICD-10-CM

## 2019-11-08 NOTE — Progress Notes (Signed)
[facility] [place], [place] [address]  Internal MEDICINE  Telephone Visit  Patient Name: Monica [name]  [id number]  OI:[id number]  Date of Service: 11/17/2019  I connected with the patient at 11:36am by telephone and verified the patients identity using two identifiers.   I discussed the limitations, risks, security and privacy concerns of performing an evaluation and management service by telephone and the availability of in person appointments. I also discussed with the patient that there may be a patient responsible charge related to the service.  The patient expressed understanding and agrees to proceed.    Chief Complaint  Patient presents with  . Telephone Assessment    pt did not take coumadin on tuesday and wednesday because she was bleeding  . Telephone Screen  . Diabetes    The patient has been contacted via telephone for follow up visit due to concerns for spread of novel coronavirus. The patient presents for follow up. She had episode of significant bleeding from the scalp where she had fallen 2 weeks earlier and had healing laceration and hematoma. Stitches from the laceration had been removed about a week ago. Had been healing normally. She had also been on antibiotics due to UTI. This tends to affect her INR. Her INR was 2.4 in the ER.  The patient denies headache, dizziness, or other abnormalities. She states that she has not washed her hair since her visit to the ER 11/06/2019 because she is afraid to disturb the healing hematoma.       Current Medication: Outpatient Encounter Medications as of 11/08/2019  Medication Sig  . albuterol (VENTOLIN HFA) 108 (90 Base) MCG/ACT inhaler Inhale 2 puffs into the lungs every 4 (four) hours as needed for wheezing or shortness of breath.  Marland Kitchen amLODipine-benazepril (LOTREL) 5-20 MG capsule   . b complex vitamins capsule Take 1 capsule by mouth daily.  Marland Kitchen desloratadine (CLARINEX) 5 MG tablet Take 1 tablet (5 mg  total) by mouth daily.  Marland Kitchen doxycycline (VIBRA-TABS) 100 MG tablet Take 1 tablet (100 mg total) by mouth 2 (two) times daily.  . famotidine (PEPCID) 20 MG tablet Take 1 tablet (20 mg total) by mouth 2 (two) times daily.  . fluticasone furoate-vilanterol (BREO ELLIPTA) 100-25 MCG/INH AEPB TAKE 1 PUFF ONCE A DAY  . furosemide (LASIX) 20 MG tablet Take 20 mg by mouth daily.   Marland Kitchen gabapentin (NEURONTIN) 300 MG capsule Take 2 capsules (600 mg total) by mouth at bedtime.  Marland Kitchen glipiZIDE (GLUCOTROL XL) 2.5 MG 24 hr tablet Take 1 tablet (2.5 mg total) by mouth daily.  Marland Kitchen glucose blood (ONETOUCH ULTRA) test strip 1 each by Other route daily. Use as instructed  . hydrochlorothiazide (HYDRODIURIL) 25 MG tablet   . levofloxacin (LEVAQUIN) 500 MG tablet Take 1 tablet (500 mg total) by mouth daily.  . Liniments (SALONPAS PAIN RELIEF PATCH EX) Apply topically.  . Magnesium 200 MG TABS Take 1 tablet (200 mg total) by mouth daily.  . metFORMIN (GLUCOPHAGE-XR) 500 MG 24 hr tablet Take 2 tablets (1,000 mg total) by mouth daily.  . montelukast (SINGULAIR) 10 MG tablet Take 10 mg by mouth at bedtime.  Marland Kitchen nystatin cream (MYCOSTATIN) Apply topically 2 (two) times daily.  Marland Kitchen oxybutynin (DITROPAN-XL) 10 MG 24 hr tablet Take 1 tablet (10 mg total) by mouth daily.  . pravastatin (PRAVACHOL) 40 MG tablet Take 1 tablet (40 mg total) by mouth daily.  . sodium chloride 1 g tablet Take 1 tablet (1 g  total) by mouth 3 (three) times daily with meals.  . theophylline (THEO-24) 100 MG 24 hr capsule TAKE 1 CAPSULE BY MOUTH EVERY DAY  . triamcinolone (KENALOG) 0.025 % cream Apply 1 application topically 2 (two) times daily. Ok to mix with topical lotion and apply to all affected areas.  . warfarin (COUMADIN) 4 MG tablet TAKE ONE TABLET EVERY DAY EXCEPT MONDAY TAKE 1/2 TABLET   No facility-administered encounter medications on file as of 11/08/2019.    Surgical History: Past Surgical History:  Procedure Laterality Date  . ABDOMINAL  HYSTERECTOMY  1975  . BLADDER SUSPENSION  1989  . COLONOSCOPY    . COLONOSCOPY WITH PROPOFOL N/A 01/25/2018   Procedure: COLONOSCOPY WITH PROPOFOL;  Surgeon: [name], MD;  Location: [facility] ENDOSCOPY;  Service: Endoscopy;  Laterality: N/A;  . ESOPHAGOGASTRODUODENOSCOPY (EGD) WITH PROPOFOL N/A 01/24/2018   Procedure: ESOPHAGOGASTRODUODENOSCOPY (EGD) WITH PROPOFOL;  Surgeon: [name], MD;  Location: [facility] ENDOSCOPY;  Service: Endoscopy;  Laterality: N/A;  . HIP ARTHROPLASTY Left 06/03/2019   Procedure: ARTHROPLASTY BIPOLAR HIP (HEMIARTHROPLASTY);  Surgeon: Dereck Leep, MD;  Location: [facility] ORS;  Service: Orthopedics;  Laterality: Left;    Medical History: Past Medical History:  Diagnosis Date  . Atrial fibrillation ([place])   . Bronchitis   . Cancer ([place])    Left leg growth, kidneys, lungs and breasts  . Carcinoma of unknown primary ([place])   . COPD (chronic obstructive pulmonary disease) ([place])   . Diabetes mellitus without complication ([place])   . Hyperlipidemia   . Hypertension   . Pancreatitis   . Pneumonia   . Stroke ([place])    TIA's  . Vitamin B12 deficiency     Family History: Family History  Problem Relation Age of Onset  . Cancer Father   . Diabetes Brother     Social History   Socioeconomic History  . Marital status: Divorced    Spouse name: Not on file  . Number of children: Not on file  . Years of education: Not on file  . Highest education level: Not on file  Occupational History  . Not on file  Tobacco Use  . Smoking status: Former Smoker    Packs/day: 0.50    Years: 15.00    Pack years: 7.50    Types: Cigarettes    Quit date: 11/15/1988    Years since quitting: 31.0  . Smokeless tobacco: Never Used  Substance and Sexual Activity  . Alcohol use: No    Alcohol/week: 0.0 standard drinks  . Drug use: No  . Sexual activity: Not on file    Comment: hysterectomy   Other Topics Concern  . Not on file  Social History Narrative  . Not on file   Social  Determinants of Health   Financial Resource Strain:   . Difficulty of Paying Living Expenses: Not on file  Food Insecurity:   . Worried About Charity fundraiser in the Last Year: Not on file  . Ran Out of Food in the Last Year: Not on file  Transportation Needs:   . Lack of Transportation (Medical): Not on file  . Lack of Transportation (Non-Medical): Not on file  Physical Activity:   . Days of Exercise per Week: Not on file  . Minutes of Exercise per Session: Not on file  Stress:   . Feeling of Stress : Not on file  Social Connections:   . Frequency of Communication with Friends and Family: Not on file  . Frequency of Social  Gatherings with Friends and Family: Not on file  . Attends Religious Services: Not on file  . Active Member of Clubs or Organizations: Not on file  . Attends Archivist Meetings: Not on file  . Marital Status: Not on file  Intimate Partner Violence:   . Fear of Current or Ex-Partner: Not on file  . Emotionally Abused: Not on file  . Physically Abused: Not on file  . Sexually Abused: Not on file      Review of Systems  Constitutional: Negative for activity change, chills, fatigue and unexpected weight change.  HENT: Negative for congestion, postnasal drip, rhinorrhea, sneezing and sore throat.        Hematoma on the posterior area of the scalp.   Respiratory: Negative for cough, chest tightness and shortness of breath.   Cardiovascular: Negative for chest pain and palpitations.  Gastrointestinal: Negative for abdominal pain, constipation, diarrhea, nausea and vomiting.  Musculoskeletal: Negative for arthralgias, back pain, joint swelling and neck pain.  Skin: Negative for rash.  Neurological: Negative for dizziness, tremors, numbness and headaches.  Hematological: Negative for adenopathy. Does not bruise/bleed easily.  Psychiatric/Behavioral: Negative for behavioral problems (Depression), sleep disturbance and suicidal ideas. The patient is  not nervous/anxious.     Today's Vitals   11/08/19 1114  Height: 5\' 4"  (1.626 m)   Body mass index is 25.75 kg/m.  Observation/Objective:   The patient is alert and oriented. She is pleasant and answers all questions appropriately. Breathing is non-labored. She is in no acute distress at this time. She sounds worried.    Assessment/Plan: 1. Hematoma of scalp, subsequent encounter Recent trip to the ER due to bleeding hematoma of the scalp. Quick clot was applied and patient's bleeding stopped. Advised her to monitor the hematoma closely.   2. Deep vein thrombosis (DVT) of non-extremity vein, unspecified chronicity INR 11/06/2019 was 2.4. for now, continue current dosing regimen of warfarin. Recheck in one week.   3. Type 2 diabetes mellitus with vascular disease ([place]) Continue all diabetic medication as prescribed   General Counseling: Ralene verbalizes understanding of the findings of today's phone visit and agrees with plan of treatment. I have discussed any further diagnostic evaluation that may be needed or ordered today. We also reviewed her medications today. she has been encouraged to call the office with any questions or concerns that should arise related to todays visit.  This patient was seen by Leretha Pol FNP Collaboration with Dr Lavera Guise as a part of collaborative care agreement  Time spent: 25 Minutes    Dr Lavera Guise Internal medicine

## 2019-11-14 ENCOUNTER — Other Ambulatory Visit: Payer: Self-pay

## 2019-11-14 ENCOUNTER — Emergency Department
Admission: EM | Admit: 2019-11-14 | Discharge: 2019-11-14 | Disposition: A | Discharge status: Home / Self Care | Payer: Medicare Other | Attending: Emergency Medicine | Admitting: Emergency Medicine

## 2019-11-14 DIAGNOSIS — J449 Chronic obstructive pulmonary disease, unspecified: Secondary | ICD-10-CM | POA: Insufficient documentation

## 2019-11-14 DIAGNOSIS — I10 Essential (primary) hypertension: Secondary | ICD-10-CM | POA: Insufficient documentation

## 2019-11-14 DIAGNOSIS — Z853 Personal history of malignant neoplasm of breast: Secondary | ICD-10-CM | POA: Insufficient documentation

## 2019-11-14 DIAGNOSIS — Z85528 Personal history of other malignant neoplasm of kidney: Secondary | ICD-10-CM | POA: Insufficient documentation

## 2019-11-14 DIAGNOSIS — Z79899 Other long term (current) drug therapy: Secondary | ICD-10-CM | POA: Diagnosis not present

## 2019-11-14 DIAGNOSIS — Z87891 Personal history of nicotine dependence: Secondary | ICD-10-CM | POA: Insufficient documentation

## 2019-11-14 DIAGNOSIS — Z7901 Long term (current) use of anticoagulants: Secondary | ICD-10-CM | POA: Diagnosis not present

## 2019-11-14 DIAGNOSIS — Z85118 Personal history of other malignant neoplasm of bronchus and lung: Secondary | ICD-10-CM | POA: Diagnosis not present

## 2019-11-14 DIAGNOSIS — X58XXXD Exposure to other specified factors, subsequent encounter: Secondary | ICD-10-CM | POA: Insufficient documentation

## 2019-11-14 DIAGNOSIS — Z7984 Long term (current) use of oral hypoglycemic drugs: Secondary | ICD-10-CM | POA: Insufficient documentation

## 2019-11-14 DIAGNOSIS — S0003XA Contusion of scalp, initial encounter: Secondary | ICD-10-CM | POA: Diagnosis not present

## 2019-11-14 DIAGNOSIS — Z743 Need for continuous supervision: Secondary | ICD-10-CM | POA: Diagnosis not present

## 2019-11-14 DIAGNOSIS — E119 Type 2 diabetes mellitus without complications: Secondary | ICD-10-CM | POA: Diagnosis not present

## 2019-11-14 DIAGNOSIS — W19XXXA Unspecified fall, initial encounter: Secondary | ICD-10-CM | POA: Diagnosis not present

## 2019-11-14 DIAGNOSIS — S0003XD Contusion of scalp, subsequent encounter: Secondary | ICD-10-CM | POA: Insufficient documentation

## 2019-11-14 DIAGNOSIS — T148XXA Other injury of unspecified body region, initial encounter: Secondary | ICD-10-CM

## 2019-11-14 DIAGNOSIS — R58 Hemorrhage, not elsewhere classified: Secondary | ICD-10-CM | POA: Diagnosis not present

## 2019-11-14 NOTE — ED Provider Notes (Signed)
[facility] Emergency Department Provider Note   ____________________________________________    I have reviewed the triage vital signs and the nursing notes.   HISTORY  Chief Complaint Hematoma     HPI Monica [name] is a 85 y.o. female who fell on 9 December and required stitches to her scalp because of small arterial bleeding in the emergency department.  She is on blood thinners.  Had a repeat visit on the 22nd for continued bleeding.  Now presents today after washing her hair hematoma started bleeding again.  She was able to stop the bleeding on her own.  Feels well overall.  No dizziness or lightheadedness.  Past Medical History:  Diagnosis Date  . Atrial fibrillation ([place])   . Bronchitis   . Cancer ([place])    Left leg growth, kidneys, lungs and breasts  . Carcinoma of unknown primary ([place])   . COPD (chronic obstructive pulmonary disease) ([place])   . Diabetes mellitus without complication ([place])   . Hyperlipidemia   . Hypertension   . Pancreatitis   . Pneumonia   . Stroke ([place])    TIA's  . Vitamin B12 deficiency     Patient Active Problem List   Diagnosis Date Noted  . Scalp laceration, subsequent encounter 10/26/2019  . Muscle cramping 10/26/2019  . Long term current use of anticoagulant therapy 10/26/2019  . Encounter for general adult medical examination with abnormal findings 08/21/2019  . Cellulitis of right buttock 07/25/2019  . Diabetic neuropathy ([place]) 06/25/2019  . Type 2 diabetes mellitus with vascular disease ([place]) 06/25/2019  . Pressure injury of skin 06/05/2019  . 'light-for-dates' infant with signs of fetal malnutrition 05/31/2019  . Closed left hip fracture ([place]) 05/31/2019  . Inflammatory polyarthritis ([place]) 05/09/2019  . Deep vein thrombosis (DVT) ([place]) 11/29/2018  . Edema of left lower extremity 11/13/2018  . Cellulitis of left lower leg 11/01/2018  . Encounter for therapeutic drug level monitoring 07/05/2018  .  Allergic contact dermatitis 04/19/2018  . Atopic dermatitis 03/15/2018  . 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 Atrial fibrillation ([place]) 02/12/2018  . Melena   . Urinary tract infection with hematuria 01/04/2018  . Chronic obstructive pulmonary disease ([place]) 12/27/2017  . Acute cystitis with hematuria 12/27/2017  . Dysuria 12/27/2017  . Urinary frequency 12/27/2017  . Uncontrolled type 2 diabetes mellitus with hyperglycemia ([place]) 12/27/2017  . Iron deficiency anemia due to chronic blood loss 09/28/2016  . Avitaminosis D 09/09/2016  . B12 deficiency 09/09/2016  . Temporary cerebral vascular dysfunction 09/09/2016  . Spinal stenosis 09/09/2016  . Pain in shoulder 09/09/2016  . Restless legs syndrome 09/09/2016  . Personal history of urinary calculi 09/09/2016  . H/O deep venous thrombosis 09/09/2016  . Gout 09/09/2016  . Accumulation of fluid in tissues 09/09/2016  . Carpal tunnel syndrome 09/09/2016  . Chronic lung disease 09/09/2016  . Cataract 09/09/2016  . Appendicular ataxia 09/09/2016  . Airway hyperreactivity 09/09/2016  . Rectal bleeding   . Anemia due to blood loss, acute   . Gastrointestinal hemorrhage 08/08/2016  . Carcinoma of unknown primary ([place]) 08/02/2016  . Diabetes ([place]) 05/08/2015  . Arthropathy of hand 02/04/2010  . 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 Detrusor instability of bladder 06/18/2009  . Pure hypercholesterolemia 04/10/2009  . Essential hypertension 04/10/2009  . Diverticulitis of colon 04/10/2009    Past Surgical History:  Procedure Laterality Date  . ABDOMINAL HYSTERECTOMY  1975  . BLADDER SUSPENSION  1989  . COLONOSCOPY    .  COLONOSCOPY WITH PROPOFOL N/A 01/25/2018   Procedure: COLONOSCOPY WITH PROPOFOL;  Surgeon: [name], MD;  Location: [facility] ENDOSCOPY;  Service: Endoscopy;  Laterality: N/A;  . ESOPHAGOGASTRODUODENOSCOPY (EGD) WITH PROPOFOL N/A 01/24/2018   Procedure: ESOPHAGOGASTRODUODENOSCOPY (EGD) WITH PROPOFOL;  Surgeon:  [name], MD;  Location: [facility] ENDOSCOPY;  Service: Endoscopy;  Laterality: N/A;  . HIP ARTHROPLASTY Left 06/03/2019   Procedure: ARTHROPLASTY BIPOLAR HIP (HEMIARTHROPLASTY);  Surgeon: Dereck Leep, MD;  Location: [facility] ORS;  Service: Orthopedics;  Laterality: Left;    Prior to Admission medications   Medication Sig Start Date End Date Taking? Authorizing Provider  albuterol (VENTOLIN HFA) 108 (90 Base) MCG/ACT inhaler Inhale 2 puffs into the lungs every 4 (four) hours as needed for wheezing or shortness of breath. 03/06/19   [name], NP  amLODipine-benazepril (LOTREL) 5-20 MG capsule  06/18/19   [provider]  b complex vitamins capsule Take 1 capsule by mouth daily.    [provider]  desloratadine (CLARINEX) 5 MG tablet Take 1 tablet (5 mg total) by mouth daily. 07/19/19   [name], NP  doxycycline (VIBRA-TABS) 100 MG tablet Take 1 tablet (100 mg total) by mouth 2 (two) times daily. 07/18/19   [name], NP  famotidine (PEPCID) 20 MG tablet Take 1 tablet (20 mg total) by mouth 2 (two) times daily. 08/11/16   Epifanio Lesches, MD  fluticasone furoate-vilanterol (BREO ELLIPTA) 100-25 MCG/INH AEPB TAKE 1 PUFF ONCE A DAY 09/14/19   Boscia, Greer Ee, NP  furosemide (LASIX) 20 MG tablet Take 20 mg by mouth daily.  05/24/16   [provider]  gabapentin (NEURONTIN) 300 MG capsule Take 2 capsules (600 mg total) by mouth at bedtime. 09/14/19   [name], NP  glipiZIDE (GLUCOTROL XL) 2.5 MG 24 hr tablet Take 1 tablet (2.5 mg total) by mouth daily. 04/18/19   [name], NP  glucose blood (ONETOUCH ULTRA) test strip 1 each by Other route daily. Use as instructed 07/26/19   [name], NP  hydrochlorothiazide (HYDRODIURIL) 25 MG tablet  06/18/19   [provider]  levofloxacin (LEVAQUIN) 500 MG tablet Take 1 tablet (500 mg total) by mouth daily. 10/26/19   [name], NP  Liniments (SALONPAS PAIN RELIEF PATCH EX)  Apply topically.    [provider]  Magnesium 200 MG TABS Take 1 tablet (200 mg total) by mouth daily. 10/26/19   [name], NP  metFORMIN (GLUCOPHAGE-XR) 500 MG 24 hr tablet Take 2 tablets (1,000 mg total) by mouth daily. 08/15/19   [name], NP  montelukast (SINGULAIR) 10 MG tablet Take 10 mg by mouth at bedtime.    [provider]  nystatin cream (MYCOSTATIN) Apply topically 2 (two) times daily. 07/18/19   [name], NP  oxybutynin (DITROPAN-XL) 10 MG 24 hr tablet Take 1 tablet (10 mg total) by mouth daily. 10/18/19   [name], NP  pravastatin (PRAVACHOL) 40 MG tablet Take 1 tablet (40 mg total) by mouth daily. 08/15/19   [name], NP  sodium chloride 1 g tablet Take 1 tablet (1 g total) by mouth 3 (three) times daily with meals. 06/09/19   Dustin Flock, MD  theophylline (THEO-24) 100 MG 24 hr capsule TAKE 1 CAPSULE BY MOUTH EVERY DAY 09/14/19   [name], NP  triamcinolone (KENALOG) 0.025 % cream Apply 1 application topically 2 (two) times daily. Ok to mix with topical lotion and apply to all  affected areas. 05/07/19   [name], NP  warfarin (COUMADIN) 4 MG tablet TAKE ONE TABLET EVERY DAY EXCEPT MONDAY TAKE 1/2 TABLET 10/18/19   [name], NP     Allergies Sulfa antibiotics, Celecoxib, Acetaminophen, Codeine, Lyrica [pregabalin], Penicillin g, and Petrolatum-zinc oxide  Family History  Problem Relation Age of Onset  . Cancer Father   . Diabetes Brother     Social History Social History   Tobacco Use  . Smoking status: Former Smoker    Packs/day: 0.50    Years: 15.00    Pack years: 7.50    Types: Cigarettes    Quit date: 11/15/1988    Years since quitting: 31.0  . Smokeless tobacco: Never Used  Substance Use Topics  . Alcohol use: No    Alcohol/week: 0.0 standard drinks  . Drug use: No    Review of Systems  Constitutional: No dizziness     Gastrointestinal:  No nausea, no vomiting.      Skin: Negative for rash. Neurological: Negative for headaches     ____________________________________________   PHYSICAL EXAM:  VITAL SIGNS: ED Triage Vitals  Enc Vitals Group     BP 11/14/19 1103 111/63     Pulse Rate 11/14/19 1103 86     Resp 11/14/19 1103 16     Temp 11/14/19 1103 97.6 F (36.4 C)     Temp Source 11/14/19 1103 Oral     SpO2 11/14/19 1103 97 %     Weight 11/14/19 1105 68 kg (150 lb)     Height 11/14/19 1105 1.626 m (5\' 4" )     Head Circumference --      Peak Flow --      Pain Score 11/14/19 1105 0     Pain Loc --      Pain Edu? --      Excl. in [place]? --      Constitutional: Alert and oriented. No acute distress.  Eyes: Conjunctivae are normal.  Head: Mild posterior scalp hematoma, no active bleeding,  Nose: No congestion/rhinnorhea. Mouth/Throat: Mucous membranes are moist.     Respiratory: Normal respiratory effort.  Musculoskeletal: No lower extremity tenderness nor edema.   Neurologic:  Normal speech and language. No gross focal neurologic deficits are appreciated.   Skin:  Skin is warm, dry and intact. No rash noted.   ____________________________________________   LABS (all labs ordered are listed, but only abnormal results are displayed)  Labs Reviewed - No data to display ____________________________________________  EKG   ____________________________________________  RADIOLOGY   ____________________________________________   PROCEDURES  Procedure(s) performed: No  Procedures   Critical Care performed: No ____________________________________________   INITIAL IMPRESSION / ASSESSMENT AND PLAN / ED COURSE  Pertinent labs & imaging results that were available during my care of the patient were reviewed by me and considered in my medical decision making (see chart for details).  Patient's bleeding controlled without need for intervention, suspect her blood thinners are the cause of this however she does have a  history of atrial fibrillation.  Recommended avoiding washing outside of her hair for 1 to 2 weeks to allow the hematoma to fully heal.   ____________________________________________   FINAL CLINICAL IMPRESSION(S) / ED DIAGNOSES  Final diagnoses:  Hematoma      NEW MEDICATIONS STARTED DURING THIS VISIT:  Discharge Medication List as of 11/14/2019  2:11 PM       Note:  This document was prepared using Dragon voice recognition software and may include unintentional  dictation errors.   Lavonia Drafts, MD 11/14/19 [id number]

## 2019-11-14 NOTE — ED Notes (Signed)
MD at bedside. 

## 2019-11-14 NOTE — ED Triage Notes (Signed)
Pt comes into the ED via EMS from independent living at [facility], pt has a hematoma to the back of the head that she has had since a fall 12/9 and today it ruptured and bleed. Is not bleeding on arrival . Pt denies having a HA or fever. Pt is a/ox on arrival.

## 2019-11-14 NOTE — ED Notes (Signed)
Daughter [name] updated on pt plan of care and discharge, will come pick up pt

## 2019-11-15 DIAGNOSIS — I4821 Permanent atrial fibrillation: Secondary | ICD-10-CM | POA: Diagnosis not present

## 2019-11-15 DIAGNOSIS — I480 Paroxysmal atrial fibrillation: Secondary | ICD-10-CM | POA: Diagnosis not present

## 2019-11-17 DIAGNOSIS — S0003XD Contusion of scalp, subsequent encounter: Secondary | ICD-10-CM | POA: Insufficient documentation

## 2019-11-19 ENCOUNTER — Ambulatory Visit (INDEPENDENT_AMBULATORY_CARE_PROVIDER_SITE_OTHER): Payer: Medicare Other

## 2019-11-19 ENCOUNTER — Other Ambulatory Visit: Payer: Self-pay

## 2019-11-19 DIAGNOSIS — Z7901 Long term (current) use of anticoagulants: Secondary | ICD-10-CM

## 2019-11-19 NOTE — Progress Notes (Signed)
Pt INR 1.9 as per  Monica [name] continue same med for now

## 2019-11-20 ENCOUNTER — Other Ambulatory Visit: Payer: Self-pay

## 2019-11-20 ENCOUNTER — Telehealth: Payer: Self-pay

## 2019-11-20 DIAGNOSIS — L209 Atopic dermatitis, unspecified: Secondary | ICD-10-CM

## 2019-11-20 DIAGNOSIS — L239 Allergic contact dermatitis, unspecified cause: Secondary | ICD-10-CM

## 2019-11-20 MED ORDER — GLIPIZIDE ER 2.5 MG PO TB24: 2.5000 mg | ORAL_TABLET | Freq: Every day | ORAL | 1 refills | Status: DC

## 2019-11-20 MED ORDER — DESLORATADINE 5 MG PO TABS: 5.0000 mg | ORAL_TABLET | Freq: Every day | ORAL | 3 refills | Status: DC

## 2019-11-20 NOTE — Telephone Encounter (Signed)
Spoke with [facility] for home health referral for wound care

## 2019-11-20 NOTE — Telephone Encounter (Signed)
I am giving you order to get home health/wound care to go out to evaluate and treat her for this. We need to get her this ASAP so she does not feel the need to keep going to ER for this. Thanks. Her INR has been ok, even a little low.

## 2019-11-20 NOTE — Addendum Note (Signed)
Addended by: Leretha Pol on: 11/20/2019 01:14 PM   Modules accepted: Orders

## 2019-11-21 NOTE — Telephone Encounter (Signed)
Pt was referred to [facility]

## 2019-11-22 ENCOUNTER — Inpatient Hospital Stay
Admission: EM | Admit: 2019-11-22 | Discharge: 2019-11-25 | DRG: 982 | Disposition: A | Discharge status: Home / Self Care | Payer: Medicare Other | Attending: Internal Medicine | Admitting: Internal Medicine

## 2019-11-22 ENCOUNTER — Observation Stay: Payer: Medicare Other

## 2019-11-22 ENCOUNTER — Other Ambulatory Visit: Payer: Self-pay

## 2019-11-22 ENCOUNTER — Telehealth: Payer: Self-pay

## 2019-11-22 DIAGNOSIS — R58 Hemorrhage, not elsewhere classified: Secondary | ICD-10-CM | POA: Diagnosis not present

## 2019-11-22 DIAGNOSIS — K859 Acute pancreatitis without necrosis or infection, unspecified: Secondary | ICD-10-CM | POA: Diagnosis not present

## 2019-11-22 DIAGNOSIS — I10 Essential (primary) hypertension: Secondary | ICD-10-CM | POA: Diagnosis not present

## 2019-11-22 DIAGNOSIS — S0003XA Contusion of scalp, initial encounter: Secondary | ICD-10-CM | POA: Diagnosis not present

## 2019-11-22 DIAGNOSIS — Z87891 Personal history of nicotine dependence: Secondary | ICD-10-CM

## 2019-11-22 DIAGNOSIS — S0003XD Contusion of scalp, subsequent encounter: Secondary | ICD-10-CM | POA: Diagnosis not present

## 2019-11-22 DIAGNOSIS — Z7984 Long term (current) use of oral hypoglycemic drugs: Secondary | ICD-10-CM | POA: Diagnosis not present

## 2019-11-22 DIAGNOSIS — W19XXXA Unspecified fall, initial encounter: Secondary | ICD-10-CM | POA: Diagnosis present

## 2019-11-22 DIAGNOSIS — E1159 Type 2 diabetes mellitus with other circulatory complications: Secondary | ICD-10-CM

## 2019-11-22 DIAGNOSIS — G5600 Carpal tunnel syndrome, unspecified upper limb: Secondary | ICD-10-CM | POA: Diagnosis not present

## 2019-11-22 DIAGNOSIS — E1165 Type 2 diabetes mellitus with hyperglycemia: Secondary | ICD-10-CM | POA: Diagnosis not present

## 2019-11-22 DIAGNOSIS — E785 Hyperlipidemia, unspecified: Secondary | ICD-10-CM | POA: Diagnosis not present

## 2019-11-22 DIAGNOSIS — R791 Abnormal coagulation profile: Secondary | ICD-10-CM | POA: Diagnosis not present

## 2019-11-22 DIAGNOSIS — Z880 Allergy status to penicillin: Secondary | ICD-10-CM

## 2019-11-22 DIAGNOSIS — Z882 Allergy status to sulfonamides status: Secondary | ICD-10-CM

## 2019-11-22 DIAGNOSIS — D649 Anemia, unspecified: Secondary | ICD-10-CM

## 2019-11-22 DIAGNOSIS — J449 Chronic obstructive pulmonary disease, unspecified: Secondary | ICD-10-CM | POA: Diagnosis not present

## 2019-11-22 DIAGNOSIS — C7902 Secondary malignant neoplasm of left kidney and renal pelvis: Secondary | ICD-10-CM | POA: Diagnosis not present

## 2019-11-22 DIAGNOSIS — Z7901 Long term (current) use of anticoagulants: Secondary | ICD-10-CM

## 2019-11-22 DIAGNOSIS — C7901 Secondary malignant neoplasm of right kidney and renal pelvis: Secondary | ICD-10-CM | POA: Diagnosis not present

## 2019-11-22 DIAGNOSIS — Z885 Allergy status to narcotic agent status: Secondary | ICD-10-CM

## 2019-11-22 DIAGNOSIS — Z8673 Personal history of transient ischemic attack (TIA), and cerebral infarction without residual deficits: Secondary | ICD-10-CM

## 2019-11-22 DIAGNOSIS — I4891 Unspecified atrial fibrillation: Secondary | ICD-10-CM | POA: Diagnosis not present

## 2019-11-22 DIAGNOSIS — Z96642 Presence of left artificial hip joint: Secondary | ICD-10-CM | POA: Diagnosis present

## 2019-11-22 DIAGNOSIS — C7989 Secondary malignant neoplasm of other specified sites: Secondary | ICD-10-CM | POA: Diagnosis not present

## 2019-11-22 DIAGNOSIS — Z888 Allergy status to other drugs, medicaments and biological substances status: Secondary | ICD-10-CM | POA: Diagnosis not present

## 2019-11-22 DIAGNOSIS — E559 Vitamin D deficiency, unspecified: Secondary | ICD-10-CM | POA: Diagnosis not present

## 2019-11-22 DIAGNOSIS — Z743 Need for continuous supervision: Secondary | ICD-10-CM | POA: Diagnosis not present

## 2019-11-22 DIAGNOSIS — E538 Deficiency of other specified B group vitamins: Secondary | ICD-10-CM | POA: Diagnosis not present

## 2019-11-22 DIAGNOSIS — E7800 Pure hypercholesterolemia, unspecified: Secondary | ICD-10-CM | POA: Diagnosis not present

## 2019-11-22 DIAGNOSIS — G2581 Restless legs syndrome: Secondary | ICD-10-CM | POA: Diagnosis not present

## 2019-11-22 DIAGNOSIS — Z03818 Encounter for observation for suspected exposure to other biological agents ruled out: Secondary | ICD-10-CM | POA: Diagnosis not present

## 2019-11-22 DIAGNOSIS — E119 Type 2 diabetes mellitus without complications: Secondary | ICD-10-CM

## 2019-11-22 DIAGNOSIS — Z79899 Other long term (current) drug therapy: Secondary | ICD-10-CM | POA: Diagnosis not present

## 2019-11-22 DIAGNOSIS — E1151 Type 2 diabetes mellitus with diabetic peripheral angiopathy without gangrene: Secondary | ICD-10-CM | POA: Diagnosis present

## 2019-11-22 DIAGNOSIS — S0083XD Contusion of other part of head, subsequent encounter: Secondary | ICD-10-CM | POA: Diagnosis not present

## 2019-11-22 DIAGNOSIS — M109 Gout, unspecified: Secondary | ICD-10-CM | POA: Diagnosis not present

## 2019-11-22 DIAGNOSIS — I482 Chronic atrial fibrillation, unspecified: Secondary | ICD-10-CM | POA: Diagnosis present

## 2019-11-22 DIAGNOSIS — C801 Malignant (primary) neoplasm, unspecified: Secondary | ICD-10-CM | POA: Diagnosis not present

## 2019-11-22 DIAGNOSIS — D62 Acute posthemorrhagic anemia: Secondary | ICD-10-CM | POA: Diagnosis not present

## 2019-11-22 DIAGNOSIS — S15091A Other specified injury of right carotid artery, initial encounter: Secondary | ICD-10-CM | POA: Diagnosis not present

## 2019-11-22 DIAGNOSIS — C7801 Secondary malignant neoplasm of right lung: Secondary | ICD-10-CM | POA: Diagnosis not present

## 2019-11-22 DIAGNOSIS — D500 Iron deficiency anemia secondary to blood loss (chronic): Secondary | ICD-10-CM | POA: Diagnosis not present

## 2019-11-22 DIAGNOSIS — Y92009 Unspecified place in unspecified non-institutional (private) residence as the place of occurrence of the external cause: Secondary | ICD-10-CM

## 2019-11-22 DIAGNOSIS — Z90710 Acquired absence of both cervix and uterus: Secondary | ICD-10-CM

## 2019-11-22 DIAGNOSIS — Z833 Family history of diabetes mellitus: Secondary | ICD-10-CM | POA: Diagnosis not present

## 2019-11-22 DIAGNOSIS — C7981 Secondary malignant neoplasm of breast: Secondary | ICD-10-CM | POA: Diagnosis not present

## 2019-11-22 DIAGNOSIS — C7802 Secondary malignant neoplasm of left lung: Secondary | ICD-10-CM | POA: Diagnosis not present

## 2019-11-22 DIAGNOSIS — Z20822 Contact with and (suspected) exposure to covid-19: Secondary | ICD-10-CM | POA: Diagnosis present

## 2019-11-22 DIAGNOSIS — Z79890 Hormone replacement therapy (postmenopausal): Secondary | ICD-10-CM | POA: Diagnosis not present

## 2019-11-22 DIAGNOSIS — S0101XD Laceration without foreign body of scalp, subsequent encounter: Secondary | ICD-10-CM | POA: Diagnosis not present

## 2019-11-22 DIAGNOSIS — I8290 Acute embolism and thrombosis of unspecified vein: Secondary | ICD-10-CM | POA: Diagnosis not present

## 2019-11-22 DIAGNOSIS — E1140 Type 2 diabetes mellitus with diabetic neuropathy, unspecified: Secondary | ICD-10-CM | POA: Diagnosis not present

## 2019-11-22 DIAGNOSIS — E1122 Type 2 diabetes mellitus with diabetic chronic kidney disease: Secondary | ICD-10-CM

## 2019-11-22 LAB — BASIC METABOLIC PANEL
Anion gap: 12 (ref 5–15)
BUN: 16 mg/dL (ref 8–23)
CO2: 25 mmol/L (ref 22–32)
Calcium: 9.8 mg/dL (ref 8.9–10.3)
Chloride: 98 mmol/L (ref 98–111)
Creatinine, Ser: 0.86 mg/dL (ref 0.44–1.00)
GFR calc Af Amer: >60 mL/min (ref >60)
GFR calc non Af Amer: 59 mL/min — ABNORMAL LOW (ref >60)
Glucose, Bld: 121 mg/dL — ABNORMAL HIGH (ref 70–99)
Potassium: 4.2 mmol/L (ref 3.5–5.1)
Sodium: 135 mmol/L (ref 135–145)

## 2019-11-22 LAB — HEMOGLOBIN AND HEMATOCRIT, BLOOD
HCT: 21.8 % — ABNORMAL LOW (ref 36.0–46.0)
Hemoglobin: 6.6 g/dL — ABNORMAL LOW (ref 12.0–15.0)

## 2019-11-22 LAB — CBC
HCT: 24 % — ABNORMAL LOW (ref 36.0–46.0)
Hemoglobin: 7.4 g/dL — ABNORMAL LOW (ref 12.0–15.0)
MCH: 23.6 pg — ABNORMAL LOW (ref 26.0–34.0)
MCHC: 30.8 g/dL (ref 30.0–36.0)
MCV: 76.7 fL — ABNORMAL LOW (ref 80.0–100.0)
Platelets: 420 10*3/uL — ABNORMAL HIGH (ref 150–400)
RBC: 3.13 MIL/uL — ABNORMAL LOW (ref 3.87–5.11)
RDW: 15.3 % (ref 11.5–15.5)
WBC: 8.8 10*3/uL (ref 4.0–10.5)
nRBC: 0 % (ref 0.0–0.2)

## 2019-11-22 LAB — PROTIME-INR
INR: 3.3 — ABNORMAL HIGH (ref 0.8–1.2)
Prothrombin Time: 33.4 seconds — ABNORMAL HIGH (ref 11.4–15.2)

## 2019-11-22 LAB — PREPARE RBC (CROSSMATCH)

## 2019-11-22 MED ORDER — LIDOCAINE HCL (PF) 1 % IJ SOLN
INTRAMUSCULAR | Status: AC
  Filled 2019-11-22: qty 10

## 2019-11-22 MED ORDER — ONDANSETRON HCL 4 MG/2ML IJ SOLN: 4.0000 mg | Freq: Four times a day (QID) | INTRAMUSCULAR | Status: DC | PRN

## 2019-11-22 MED ORDER — SODIUM CHLORIDE 0.9% IV SOLUTION
Freq: Once | INTRAVENOUS | Status: AC
  Filled 2019-11-22: qty 250

## 2019-11-22 MED ORDER — ONDANSETRON HCL 4 MG PO TABS: 4.0000 mg | ORAL_TABLET | Freq: Four times a day (QID) | ORAL | Status: DC | PRN

## 2019-11-22 MED ORDER — LIDOCAINE HCL (CARDIAC) PF 100 MG/5ML IV SOSY
PREFILLED_SYRINGE | INTRAVENOUS | Status: AC
  Filled 2019-11-22: qty 5

## 2019-11-22 MED ORDER — FENTANYL CITRATE (PF) 100 MCG/2ML IJ SOLN: 100.0000 ug | Freq: Once | INTRAMUSCULAR | Status: AC

## 2019-11-22 MED ORDER — FENTANYL CITRATE (PF) 100 MCG/2ML IJ SOLN
INTRAMUSCULAR | Status: AC
  Administered 2019-11-22: 100 ug via INTRAMUSCULAR
  Filled 2019-11-22: qty 2

## 2019-11-22 MED ORDER — IOHEXOL 350 MG/ML SOLN
75.0000 mL | Freq: Once | INTRAVENOUS | Status: AC | PRN
  Administered 2019-11-22: 75 mL via INTRAVENOUS

## 2019-11-22 MED ORDER — INSULIN ASPART 100 UNIT/ML ~~LOC~~ SOLN
0.0000 [IU] | Freq: Three times a day (TID) | SUBCUTANEOUS | Status: DC
  Administered 2019-11-24: 2 [IU] via SUBCUTANEOUS
  Administered 2019-11-24: 3 [IU] via SUBCUTANEOUS
  Administered 2019-11-25 (×2): 2 [IU] via SUBCUTANEOUS
  Filled 2019-11-22 (×4): qty 1

## 2019-11-22 MED ORDER — VITAMIN K1 10 MG/ML IJ SOLN
5.0000 mg | Freq: Once | INTRAVENOUS | Status: AC
  Administered 2019-11-23: 01:00:00 5 mg via INTRAVENOUS
  Filled 2019-11-22: qty 0.5

## 2019-11-22 MED ORDER — SODIUM CHLORIDE 0.9 % IV SOLN: 10.0000 mL/h | Freq: Once | INTRAVENOUS | Status: DC

## 2019-11-22 MED ORDER — PHYTONADIONE 5 MG PO TABS
5.0000 mg | ORAL_TABLET | Freq: Once | ORAL | Status: DC
  Filled 2019-11-22: qty 1

## 2019-11-22 MED ORDER — ACETAMINOPHEN 500 MG PO TABS
ORAL_TABLET | ORAL | Status: AC
  Filled 2019-11-22: qty 2

## 2019-11-22 MED ORDER — ACETAMINOPHEN 500 MG PO TABS: 1000.0000 mg | ORAL_TABLET | Freq: Once | ORAL | Status: DC

## 2019-11-22 NOTE — ED Triage Notes (Signed)
Pt arrives via EMS from [facility] d/t hematoma on the back of her head bleeding- pt had a fall on 12/9 that caused the hematoma and pt noted it was bleeding again today- per EMS that had to put a clot control bandage on it to stop it was bleeding- pt takes coumadin

## 2019-11-22 NOTE — Telephone Encounter (Signed)
Gave verbal orders for [facility] for skill nursing, 1 one week, 2 two weeks, 1 for week 3. Nurse reported inr was 4.7 and per Oneita Kras pt has been advised to skip coumadin today and tomorrow and go back to regular dose on Saturday 11/24/2019. Beth

## 2019-11-22 NOTE — ED Notes (Signed)
EDP at bedside  

## 2019-11-22 NOTE — H&P (Signed)
History and Physical    Monica [name] [id number] DOB: June 27, 1926 DOA: 11/22/2019  PCP: Monica Freshwater, NP   Patient coming from: [facility]  I have personally briefly reviewed patient's old medical records in [place]  Chief Complaint: bleeding from hematoma on scalp  HPI: Monica [name] is a 86 y.o. female with medical history significant for Chronic A. fib on Coumadin, hypertension and non-insulin-dependent type 2 diabetes, who presents to the emergency room with bleeding from an occipital hematoma that she sustained in a fall on 10/24/2019 this patient's third visit to the emergency room for bleeding from the hematoma.  She denies headache or blurred vision.  She denied another fall.  She otherwise feels at her baseline.  Denies chest pains or shortness of breath or palpitations  ED Course: On arrival in the emergency room she was afebrile and normotensive with blood pressure 135/54, heart rate 90 respirations 18 with O2 sat 99% on room air.  Hemoglobin was 7.4, down from 08/07/19. Repeat hemoglobin returned at 6.6.  INR was 3.3 and apparently 4.7 the outpatient setting earlier.   According to ER provider Dr. Kerman Passey, hematoma appeared to have an active bleed, probably arterial which he successfully ligated, achieving hemostasis.  He spoke with surgeon, Dr. Hampton Abbot who advised to reverse Coumadin and get serial H&H's every 8.  Patient received 5 mg of IV vitamin K in the ER and was typed and crossed for 2 units and started on 1 unit PRBC.  [facility] hospitalist consulted for admission  Review of Systems: As per HPI otherwise 10 point review of systems negative.    Past Medical History:  Diagnosis Date  . Atrial fibrillation ([place])   . Bronchitis   . Cancer ([place])    Left leg growth, kidneys, lungs and breasts  . Carcinoma of unknown primary ([place])   . COPD (chronic obstructive pulmonary disease) ([place])   . Diabetes mellitus without complication ([place])   .  Hyperlipidemia   . Hypertension   . Pancreatitis   . Pneumonia   . Stroke ([place])    TIA's  . Vitamin B12 deficiency     Past Surgical History:  Procedure Laterality Date  . ABDOMINAL HYSTERECTOMY  1975  . BLADDER SUSPENSION  1989  . COLONOSCOPY    . COLONOSCOPY WITH PROPOFOL N/A 01/25/2018   Procedure: COLONOSCOPY WITH PROPOFOL;  Surgeon: [name], MD;  Location: [facility] ENDOSCOPY;  Service: Endoscopy;  Laterality: N/A;  . ESOPHAGOGASTRODUODENOSCOPY (EGD) WITH PROPOFOL N/A 01/24/2018   Procedure: ESOPHAGOGASTRODUODENOSCOPY (EGD) WITH PROPOFOL;  Surgeon: [name], MD;  Location: [facility] ENDOSCOPY;  Service: Endoscopy;  Laterality: N/A;  . HIP ARTHROPLASTY Left 06/03/2019   Procedure: ARTHROPLASTY BIPOLAR HIP (HEMIARTHROPLASTY);  Surgeon: Dereck Leep, MD;  Location: [facility] ORS;  Service: Orthopedics;  Laterality: Left;     reports that she quit smoking about 31 years ago. Her smoking use included cigarettes. She has a 7.50 pack-year smoking history. She has never used smokeless tobacco. She reports that she does not drink alcohol or use drugs.  Allergies  Allergen Reactions  . Sulfa Antibiotics Swelling  . Celecoxib Nausea And Vomiting  . Acetaminophen Itching  . Codeine Rash  . Lyrica [Pregabalin] Rash  . Penicillin G Rash    Has patient had a PCN reaction causing immediate rash, facial/tongue/throat swelling, SOB or lightheadedness with hypotension: Yes Has patient had a PCN reaction causing severe rash involving mucus membranes or skin necrosis: No Has patient had a PCN reaction  that required hospitalization: No Has patient had a PCN reaction occurring within the last 10 years: Unknown If all of the above answers are "NO", then may proceed with Cephalosporin use.  Marland Kitchen Petrolatum-Zinc Oxide Rash    Family History  Problem Relation Age of Onset  . Cancer Father   . Diabetes Brother      Prior to Admission medications   Medication Sig Start Date End Date Taking?  Authorizing Provider  albuterol (VENTOLIN HFA) 108 (90 Base) MCG/ACT inhaler Inhale 2 puffs into the lungs every 4 (four) hours as needed for wheezing or shortness of breath. 03/06/19   Monica Freshwater, NP  amLODipine-benazepril (LOTREL) 5-20 MG capsule  06/18/19   [provider]  b complex vitamins capsule Take 1 capsule by mouth daily.    [provider]  desloratadine (CLARINEX) 5 MG tablet Take 1 tablet (5 mg total) by mouth daily. 11/20/19   Monica Freshwater, NP  doxycycline (VIBRA-TABS) 100 MG tablet Take 1 tablet (100 mg total) by mouth 2 (two) times daily. 07/18/19   Monica Freshwater, NP  famotidine (PEPCID) 20 MG tablet Take 1 tablet (20 mg total) by mouth 2 (two) times daily. 08/11/16   Epifanio Lesches, MD  fluticasone furoate-vilanterol (BREO ELLIPTA) 100-25 MCG/INH AEPB TAKE 1 PUFF ONCE A DAY 09/14/19   Boscia, Greer Ee, NP  furosemide (LASIX) 20 MG tablet Take 20 mg by mouth daily.  05/24/16   [provider]  gabapentin (NEURONTIN) 300 MG capsule Take 2 capsules (600 mg total) by mouth at bedtime. 09/14/19   Monica Freshwater, NP  glipiZIDE (GLUCOTROL XL) 2.5 MG 24 hr tablet Take 1 tablet (2.5 mg total) by mouth daily. 11/20/19   Monica Freshwater, NP  glucose blood (ONETOUCH ULTRA) test strip 1 each by Other route daily. Use as instructed 07/26/19   Monica Freshwater, NP  hydrochlorothiazide (HYDRODIURIL) 25 MG tablet  06/18/19   [provider]  levofloxacin (LEVAQUIN) 500 MG tablet Take 1 tablet (500 mg total) by mouth daily. 10/26/19   Monica Freshwater, NP  Liniments (SALONPAS PAIN RELIEF PATCH EX) Apply topically.    [provider]  Magnesium 200 MG TABS Take 1 tablet (200 mg total) by mouth daily. 10/26/19   Monica Freshwater, NP  metFORMIN (GLUCOPHAGE-XR) 500 MG 24 hr tablet Take 2 tablets (1,000 mg total) by mouth daily. 08/15/19   Monica Freshwater, NP  montelukast (SINGULAIR) 10 MG tablet Take 10 mg by mouth at bedtime.    [provider]  nystatin cream (MYCOSTATIN) Apply topically 2 (two) times daily. 07/18/19   Monica Freshwater, NP  oxybutynin (DITROPAN-XL) 10 MG 24 hr tablet Take 1 tablet (10 mg total) by mouth daily. 10/18/19   Monica Freshwater, NP  pravastatin (PRAVACHOL) 40 MG tablet Take 1 tablet (40 mg total) by mouth daily. 08/15/19   Monica Freshwater, NP  sodium chloride 1 g tablet Take 1 tablet (1 g total) by mouth 3 (three) times daily with meals. 06/09/19   Dustin Flock, MD  theophylline (THEO-24) 100 MG 24 hr capsule TAKE 1 CAPSULE BY MOUTH EVERY DAY 09/14/19   Monica Freshwater, NP  triamcinolone (KENALOG) 0.025 % cream Apply 1 application topically 2 (two) times daily. Ok to mix with topical lotion and apply to all affected areas. 05/07/19   Monica Freshwater, NP  warfarin (COUMADIN) 4 MG tablet TAKE ONE TABLET EVERY DAY EXCEPT MONDAY TAKE 1/2 TABLET 10/18/19  Monica Freshwater, NP    Physical Exam: Vitals:   11/22/19 1839 11/22/19 1841  BP:  (!) 135/54  Pulse:  90  Resp:  18  Temp:  97.8 F (36.6 C)  TempSrc:  Oral  SpO2:  99%  Weight: 66.7 kg   Height: 5\' 4"  (1.626 m)      Vitals:   11/22/19 1839 11/22/19 1841  BP:  (!) 135/54  Pulse:  90  Resp:  18  Temp:  97.8 F (36.6 C)  TempSrc:  Oral  SpO2:  99%  Weight: 66.7 kg   Height: 5\' 4"  (1.626 m)     Constitutional: NAD, alert and oriented x 3 Eyes: PERRL, lids and conjunctivae normal HENMT: Mucous membranes are moist. Head with gauze wrap, not blood stained Neck: normal, supple, no masses, no thyromegaly Respiratory: clear to auscultation bilaterally, no wheezing, no crackles. Normal respiratory effort. No accessory muscle use.  Cardiovascular: Regular rate and rhythm, no murmurs / rubs / gallops. No extremity edema. 2+ pedal pulses. No carotid bruits.  Abdomen: no tenderness, no masses palpated. No hepatosplenomegaly. Bowel sounds positive.  Musculoskeletal: no clubbing / cyanosis. No joint deformity upper and lower  extremities.  Skin: no rashes, lesions, ulcers.  Neurologic: No gross focal neurologic deficit. Psychiatric: Normal mood and affect.   Labs on Admission: I have personally reviewed following labs and imaging studies  CBC: Recent Labs  Lab 11/22/19 1902 11/22/19 2129  WBC 8.8  --   HGB 7.4* 6.6*  HCT 24.0* 21.8*  MCV 76.7*  --   PLT 420*  --    Basic Metabolic Panel: Recent Labs  Lab 11/22/19 1902  NA 135  K 4.2  CL 98  CO2 25  GLUCOSE 121*  BUN 16  CREATININE 0.86  CALCIUM 9.8   GFR: Estimated Creatinine Clearance: 40 mL/min (by C-G formula based on SCr of 0.86 mg/dL). Liver Function Tests: No results for input(s): AST, ALT, ALKPHOS, BILITOT, PROT, ALBUMIN in the last 168 hours. No results for input(s): LIPASE, AMYLASE in the last 168 hours. No results for input(s): AMMONIA in the last 168 hours. Coagulation Profile: Recent Labs  Lab 11/22/19 1902  INR 3.3*   Cardiac Enzymes: No results for input(s): CKTOTAL, CKMB, CKMBINDEX, TROPONINI in the last 168 hours. BNP (last 3 results) No results for input(s): PROBNP in the last 8760 hours. HbA1C: No results for input(s): HGBA1C in the last 72 hours. CBG: No results for input(s): GLUCAP in the last 168 hours. Lipid Profile: No results for input(s): CHOL, HDL, LDLCALC, TRIG, CHOLHDL, LDLDIRECT in the last 72 hours. Thyroid Function Tests: No results for input(s): TSH, T4TOTAL, FREET4, T3FREE, THYROIDAB in the last 72 hours. Anemia Panel: No results for input(s): VITAMINB12, FOLATE, FERRITIN, TIBC, IRON, RETICCTPCT in the last 72 hours. Urine analysis:    Component Value Date/Time   COLORURINE YELLOW (A) 06/08/2019 2110   APPEARANCEUR Cloudy (A) 08/21/2019 1350   LABSPEC 1.005 06/08/2019 2110   LABSPEC 1.012 03/04/2014 1535   PHURINE 5.0 06/08/2019 2110   GLUCOSEU Negative 08/21/2019 1350   GLUCOSEU 50 mg/dL 03/04/2014 1535   HGBUR NEGATIVE 06/08/2019 2110   BILIRUBINUR Negative 08/21/2019 1350    BILIRUBINUR Negative 03/04/2014 1535   KETONESUR NEGATIVE 06/08/2019 2110   PROTEINUR Negative 08/21/2019 1350   PROTEINUR NEGATIVE 06/08/2019 2110   UROBILINOGEN 0.2 12/27/2017 1520   NITRITE Negative 08/21/2019 1350   NITRITE NEGATIVE 06/08/2019 2110   LEUKOCYTESUR 3+ (A) 08/21/2019 1350   LEUKOCYTESUR SMALL (A) 06/08/2019  2110   LEUKOCYTESUR 3+ 03/04/2014 1535    Radiological Exams on Admission: No results found.  EKG: Independently reviewed.   Assessment/Plan   Acute blood loss anemia Bleeding occipital hematoma sustained during a fall on 10/24/2019 -Continue transfusion of PRBC started in the emergency room.  Will decide on second unit based on posttransfusion H&H --Compressive bandage to area -Wound checks to ensure continued hemostasis -Surgical consult placed to Dr. Hampton Abbot -Coumadin discontinued -May need to evaluate risk benefit of continued anticoagulation, given patient also does have history of GI bleed    Supratherapeutic INR -Discontinue Coumadin -Vitamin K 5 mg IV given in the emergency room -Monitor INR    Essential hypertension -Blood pressure presently controlled -Monitor for hypotension in view of blood loss    Atrial fibrillation, chronic (HCC) -Currently rate controlled -Continue home rate control agents once blood pressure tolerates    Non-insulin dependent type 2 diabetes mellitus ([place]) -Hold home oral hypoglycemic agents -Supplemental insulin coverage       DVT prophylaxis: scd  Code Status: full  Family Communication: none  Disposition Plan: Back to previous home environment Consults called: Dr Hampton Abbot, surgery     Athena Masse MD [facility]     11/22/2019, 10:15 PM

## 2019-11-22 NOTE — ED Provider Notes (Addendum)
[facility] Emergency Department Provider Note  Time seen: 7:01 PM  I have reviewed the triage vital signs and the nursing notes.   HISTORY  Chief Complaint Head Injury   HPI Monica [name] is a 86 y.o. female with a past medical history of atrial fibrillation on Coumadin, diabetes, hypertension, hyperlipidemia presents to the emergency department for bleeding hematoma.  According to the patient approximately 1 month ago she had a fall with a posterior head injury.  States this is the third or fourth time since that fall that she has had bleeding from this injury.  Patient denies any new injuries today.  States she was sitting on her couch watching TV when she began feeling warm liquid flow down the back of her head and her neck.  Patient denies any lightheadedness or dizziness.  Denies any significant pain to this area.   Past Medical History:  Diagnosis Date  . Atrial fibrillation ([place])   . Bronchitis   . Cancer ([place])    Left leg growth, kidneys, lungs and breasts  . Carcinoma of unknown primary ([place])   . COPD (chronic obstructive pulmonary disease) ([place])   . Diabetes mellitus without complication ([place])   . Hyperlipidemia   . Hypertension   . Pancreatitis   . Pneumonia   . Stroke ([place])    TIA's  . Vitamin B12 deficiency     Patient Active Problem List   Diagnosis Date Noted  . Hematoma of scalp 11/17/2019  . Scalp laceration, subsequent encounter 10/26/2019  . Muscle cramping 10/26/2019  . Long term current use of anticoagulant therapy 10/26/2019  . Encounter for general adult medical examination with abnormal findings 08/21/2019  . Cellulitis of right buttock 07/25/2019  . Diabetic neuropathy ([place]) 06/25/2019  . Type 2 diabetes mellitus with vascular disease ([place]) 06/25/2019  . Pressure injury of skin 06/05/2019  . 'light-for-dates' infant with signs of fetal malnutrition 05/31/2019  . Closed left hip fracture ([place]) 05/31/2019  . Inflammatory  polyarthritis ([place]) 05/09/2019  . Deep vein thrombosis (DVT) of non-extremity vein 11/29/2018  . Edema of left lower extremity 11/13/2018  . Cellulitis of left lower leg 11/01/2018  . Encounter for therapeutic drug level monitoring 07/05/2018  . Allergic contact dermatitis 04/19/2018  . Atopic dermatitis 03/15/2018  . 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 Atrial fibrillation ([place]) 02/12/2018  . Melena   . Urinary tract infection with hematuria 01/04/2018  . Chronic obstructive pulmonary disease ([place]) 12/27/2017  . Acute cystitis with hematuria 12/27/2017  . Dysuria 12/27/2017  . Urinary frequency 12/27/2017  . Uncontrolled type 2 diabetes mellitus with hyperglycemia ([place]) 12/27/2017  . Iron deficiency anemia due to chronic blood loss 09/28/2016  . Avitaminosis D 09/09/2016  . B12 deficiency 09/09/2016  . Temporary cerebral vascular dysfunction 09/09/2016  . Spinal stenosis 09/09/2016  . Pain in shoulder 09/09/2016  . Restless legs syndrome 09/09/2016  . Personal history of urinary calculi 09/09/2016  . H/O deep venous thrombosis 09/09/2016  . Gout 09/09/2016  . Accumulation of fluid in tissues 09/09/2016  . Carpal tunnel syndrome 09/09/2016  . Chronic lung disease 09/09/2016  . Cataract 09/09/2016  . Appendicular ataxia 09/09/2016  . Airway hyperreactivity 09/09/2016  . Rectal bleeding   . Anemia due to blood loss, acute   . Gastrointestinal hemorrhage 08/08/2016  . Carcinoma of unknown primary ([place]) 08/02/2016  . Diabetes ([place]) 05/08/2015  . Arthropathy of hand 02/04/2010  . 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 Detrusor instability of  bladder 06/18/2009  . Pure hypercholesterolemia 04/10/2009  . Essential hypertension 04/10/2009  . Diverticulitis of colon 04/10/2009    Past Surgical History:  Procedure Laterality Date  . ABDOMINAL HYSTERECTOMY  1975  . BLADDER SUSPENSION  1989  . COLONOSCOPY    . COLONOSCOPY WITH PROPOFOL N/A 01/25/2018   Procedure:  COLONOSCOPY WITH PROPOFOL;  Surgeon: [name], MD;  Location[facility] ENDOSCOPY;  Service: Endoscopy;  Laterality: N/A;  . ESOPHAGOGASTRODUODENOSCOPY (EGD) WITH PROPOFOL N/A 01/24/2018   Procedure: ESOPHAGOGASTRODUODENOSCOPY (EGD) WITH PROPOFOL;  Surgeon: [name], MD;  Location: [facility] ENDOSCOPY;  Service: Endoscopy;  Laterality: N/A;  . HIP ARTHROPLASTY Left 06/03/2019   Procedure: ARTHROPLASTY BIPOLAR HIP (HEMIARTHROPLASTY);  Surgeon: Dereck Leep, MD;  Location: [facility] ORS;  Service: Orthopedics;  Laterality: Left;    Prior to Admission medications   Medication Sig Start Date End Date Taking? Authorizing Provider  albuterol (VENTOLIN HFA) 108 (90 Base) MCG/ACT inhaler Inhale 2 puffs into the lungs every 4 (four) hours as needed for wheezing or shortness of breath. 03/06/19   [name], NP  amLODipine-benazepril (LOTREL) 5-20 MG capsule  06/18/19   [provider]  b complex vitamins capsule Take 1 capsule by mouth daily.    [provider]  desloratadine (CLARINEX) 5 MG tablet Take 1 tablet (5 mg total) by mouth daily. 11/20/19   [name], NP  doxycycline (VIBRA-TABS) 100 MG tablet Take 1 tablet (100 mg total) by mouth 2 (two) times daily. 07/18/19   [name], NP  famotidine (PEPCID) 20 MG tablet Take 1 tablet (20 mg total) by mouth 2 (two) times daily. 08/11/16   Epifanio Lesches, MD  fluticasone furoate-vilanterol (BREO ELLIPTA) 100-25 MCG/INH AEPB TAKE 1 PUFF ONCE A DAY 09/14/19   Boscia, Greer Ee, NP  furosemide (LASIX) 20 MG tablet Take 20 mg by mouth daily.  05/24/16   [provider]  gabapentin (NEURONTIN) 300 MG capsule Take 2 capsules (600 mg total) by mouth at bedtime. 09/14/19   [name], NP  glipiZIDE (GLUCOTROL XL) 2.5 MG 24 hr tablet Take 1 tablet (2.5 mg total) by mouth daily. 11/20/19   [name], NP  glucose blood (ONETOUCH ULTRA) test strip 1 each by Other route daily. Use as instructed 07/26/19   [name], NP  hydrochlorothiazide (HYDRODIURIL) 25 MG tablet  06/18/19   [provider]  levofloxacin (LEVAQUIN) 500 MG tablet Take 1 tablet (500 mg total) by mouth daily. 10/26/19   [name], NP  Liniments (SALONPAS PAIN RELIEF PATCH EX) Apply topically.    [provider]  Magnesium 200 MG TABS Take 1 tablet (200 mg total) by mouth daily. 10/26/19   [name], NP  metFORMIN (GLUCOPHAGE-XR) 500 MG 24 hr tablet Take 2 tablets (1,000 mg total) by mouth daily. 08/15/19   [name], NP  montelukast (SINGULAIR) 10 MG tablet Take 10 mg by mouth at bedtime.    [provider]  nystatin cream (MYCOSTATIN) Apply topically 2 (two) times daily. 07/18/19   [name], NP  oxybutynin (DITROPAN-XL) 10 MG 24 hr tablet Take 1 tablet (10 mg total) by mouth daily. 10/18/19   [name], NP  pravastatin (PRAVACHOL) 40 MG tablet Take 1 tablet (40 mg total) by mouth daily. 08/15/19   [name], NP  sodium chloride 1 g tablet Take 1 tablet (1 g total) by mouth 3 (three) times daily with meals. 06/09/19   Dustin Flock,  MD  theophylline (THEO-24) 100 MG 24 hr capsule TAKE 1 CAPSULE BY MOUTH EVERY DAY 09/14/19   [name], NP  triamcinolone (KENALOG) 0.025 % cream Apply 1 application topically 2 (two) times daily. Ok to mix with topical lotion and apply to all affected areas. 05/07/19   [name], NP  warfarin (COUMADIN) 4 MG tablet TAKE ONE TABLET EVERY DAY EXCEPT MONDAY TAKE 1/2 TABLET 10/18/19   [name], NP    Allergies  Allergen Reactions  . Sulfa Antibiotics Swelling  . Celecoxib Nausea And Vomiting  . Acetaminophen Itching  . Codeine Rash  . Lyrica [Pregabalin] Rash  . Penicillin G Rash    Has patient had a PCN reaction causing immediate rash, facial/tongue/throat swelling, SOB or lightheadedness with hypotension: Yes Has patient had a PCN reaction causing severe rash involving mucus membranes or skin necrosis:  No Has patient had a PCN reaction that required hospitalization: No Has patient had a PCN reaction occurring within the last 10 years: Unknown If all of the above answers are "NO", then may proceed with Cephalosporin use.  Marland Kitchen Petrolatum-Zinc Oxide Rash    Family History  Problem Relation Age of Onset  . Cancer Father   . Diabetes Brother     Social History Social History   Tobacco Use  . Smoking status: Former Smoker    Packs/day: 0.50    Years: 15.00    Pack years: 7.50    Types: Cigarettes    Quit date: 11/15/1988    Years since quitting: 31.0  . Smokeless tobacco: Never Used  Substance Use Topics  . Alcohol use: No    Alcohol/week: 0.0 standard drinks  . Drug use: No    Review of Systems Constitutional: Negative for fever. Cardiovascular: Negative for chest pain. Respiratory: Negative for shortness of breath. Gastrointestinal: Negative for abdominal pain Musculoskeletal: Negative for musculoskeletal complaints Neurological: Negative for headache All other ROS negative  ____________________________________________   PHYSICAL EXAM:  VITAL SIGNS: ED Triage Vitals  Enc Vitals Group     BP 11/22/19 1841 (!) 135/54     Pulse Rate 11/22/19 1841 90     Resp 11/22/19 1841 18     Temp 11/22/19 1841 97.8 F (36.6 C)     Temp Source 11/22/19 1841 Oral     SpO2 11/22/19 1841 99 %     Weight 11/22/19 1839 147 lb (66.7 kg)     Height 11/22/19 1839 5\' 4"  (1.626 m)     Head Circumference --      Peak Flow --      Pain Score 11/22/19 1839 3     Pain Loc --      Pain Edu? --      Excl. in [place]? --    Constitutional: Alert and oriented. Well appearing and in no distress. Eyes: Normal exam ENT      Head: Patient has an approximately 3 to 4 cm diameter hematoma to the occipital scalp with a pinhole bleed but bleeding consistent with likely very small arterial bleed within the hematoma.  When pressed the blood drains out but reaccumulate within seconds.  Pressure dressing  applied by myself.      Mouth/Throat: Mucous membranes are moist. Cardiovascular: Normal rate, regular rhythm.  Respiratory: Normal respiratory effort without tachypnea nor retractions. Breath sounds are clear Gastrointestinal: Soft and nontender. No distention.   Musculoskeletal: Nontender with normal range of motion in all extremities.  Neurologic:  Normal speech and language. No gross  focal neurologic deficits  Skin:  Skin is warm.  Hematoma to occipital scalp with bleed as described above. Psychiatric: Mood and affect are normal.   ____________________________________________   INITIAL IMPRESSION / ASSESSMENT AND PLAN / ED COURSE  Pertinent labs & imaging results that were available during my care of the patient were reviewed by me and considered in my medical decision making (see chart for details).   Patient presents emergency department with a bleed to occipital scalp after a fall approximately 1 month ago.  No new trauma.  We will check labs including INR.  I currently have a pressure dressing applied to the wound.  We will attempt to obtain hemostasis.  Patient's labs have resulted showing a two-point hemoglobin drop down to 7.4.  Down approximately 2 points from 2 weeks ago.  Patient's INR is 3.3, she is already aware that her INR was somewhat elevated and has been informed by her doctor not to take Coumadin tonight or tomorrow.  After unwrapping the pressure dressing the hematoma immediately filled with blood once again.  I was able to use digital pressure to find the approximate location of the artery leading into the hematoma and perform several sutures trying to blindly tie off the artery.  Was able to obtain significantly more hemostasis after this but continued to have a slight bleed but no longer an arterial flow to the bleed.  Given the patient's likely arterial bleed unsure of how much she bled at home with her hemoglobin dropping 2 points we will repeat a hemoglobin.    Patient has had mild bleeding once again of the site.  Given her two-point hemoglobin drop I discussed the patient with general surgery Dr. Hampton Abbot.  He recommends reversing the Coumadin we will dose 5 mg of oral vitamin K.  He also recommends a CTA of the head which should give [place] a better idea where the scalp arteries run.  Given a two-point drop we will discuss with the hospitalist for admission.  Surgery states they will consult on the patient and will take to the OR if needed for artery ligation.  Patient's repeat hemoglobin has dropped to 6.6.  We will dose IV vitamin K opposed to oral vitamin K, we will transfuse 1 unit and admit to the hospital service for continued monitoring.  Surgery will consult on the patient.   Monica [name] was evaluated in Emergency Department on 11/22/2019 for the symptoms described in the history of present illness. She was evaluated in the context of the global COVID-19 pandemic, which necessitated consideration that the patient might be at risk for infection with the SARS-CoV-2 virus that causes COVID-19. Institutional protocols and algorithms that pertain to the evaluation of patients at risk for COVID-19 are in a state of rapid change based on information released by regulatory bodies including the CDC and federal and state organizations. These policies and algorithms were followed during the patient's care in the ED.  CRITICAL CARE Performed by: Harvest Dark   Total critical care time: 30 minutes  Critical care time was exclusive of separately billable procedures and treating other patients.  Critical care was necessary to treat or prevent imminent or life-threatening deterioration.  Critical care was time spent personally by me on the following activities: development of treatment plan with patient and/or surrogate as well as nursing, discussions with consultants, evaluation of patient's response to treatment, examination of patient, obtaining history  from patient or surrogate, ordering and performing treatments and interventions, ordering and review  of laboratory studies, ordering and review of radiographic studies, pulse oximetry and re-evaluation of patient's condition.   ____________________________________________   FINAL CLINICAL IMPRESSION(S) / ED DIAGNOSES  Bleeding hematoma Anemia Supratherapeutic INR   Harvest Dark, MD 11/22/19 2148    Harvest Dark, MD 11/22/19 2155

## 2019-11-22 NOTE — ED Notes (Signed)
Attempted IV x2 without success  

## 2019-11-22 NOTE — ED Notes (Addendum)
Patient yelling out in pain, saying she needs some meds or she'll scream. Offered her 1g Tylenol which she refused due to allergy, which she says causing itching of her tongue and a doctor once told her she'd die. Ordered ibuprofen which she says she's not allowed to take because of her blood thinners but she does take Aleve. Made MD aware

## 2019-11-23 ENCOUNTER — Encounter
Admission: EM | Disposition: A | Discharge status: Home / Self Care | Payer: Self-pay | Source: Home / Self Care | Attending: Internal Medicine

## 2019-11-23 ENCOUNTER — Other Ambulatory Visit (INDEPENDENT_AMBULATORY_CARE_PROVIDER_SITE_OTHER): Payer: Self-pay | Admitting: Vascular Surgery

## 2019-11-23 DIAGNOSIS — D62 Acute posthemorrhagic anemia: Secondary | ICD-10-CM

## 2019-11-23 DIAGNOSIS — Z885 Allergy status to narcotic agent status: Secondary | ICD-10-CM | POA: Diagnosis not present

## 2019-11-23 DIAGNOSIS — Z8673 Personal history of transient ischemic attack (TIA), and cerebral infarction without residual deficits: Secondary | ICD-10-CM | POA: Diagnosis not present

## 2019-11-23 DIAGNOSIS — Z03818 Encounter for observation for suspected exposure to other biological agents ruled out: Secondary | ICD-10-CM | POA: Diagnosis not present

## 2019-11-23 DIAGNOSIS — S0083XD Contusion of other part of head, subsequent encounter: Secondary | ICD-10-CM | POA: Diagnosis not present

## 2019-11-23 DIAGNOSIS — Z20822 Contact with and (suspected) exposure to covid-19: Secondary | ICD-10-CM | POA: Diagnosis present

## 2019-11-23 DIAGNOSIS — R791 Abnormal coagulation profile: Secondary | ICD-10-CM | POA: Diagnosis present

## 2019-11-23 DIAGNOSIS — I482 Chronic atrial fibrillation, unspecified: Secondary | ICD-10-CM | POA: Diagnosis present

## 2019-11-23 DIAGNOSIS — J449 Chronic obstructive pulmonary disease, unspecified: Secondary | ICD-10-CM | POA: Diagnosis present

## 2019-11-23 DIAGNOSIS — Z90710 Acquired absence of both cervix and uterus: Secondary | ICD-10-CM | POA: Diagnosis not present

## 2019-11-23 DIAGNOSIS — Z833 Family history of diabetes mellitus: Secondary | ICD-10-CM | POA: Diagnosis not present

## 2019-11-23 DIAGNOSIS — R58 Hemorrhage, not elsewhere classified: Secondary | ICD-10-CM | POA: Diagnosis not present

## 2019-11-23 DIAGNOSIS — Z79899 Other long term (current) drug therapy: Secondary | ICD-10-CM | POA: Diagnosis not present

## 2019-11-23 DIAGNOSIS — I10 Essential (primary) hypertension: Secondary | ICD-10-CM

## 2019-11-23 DIAGNOSIS — E1151 Type 2 diabetes mellitus with diabetic peripheral angiopathy without gangrene: Secondary | ICD-10-CM | POA: Diagnosis present

## 2019-11-23 DIAGNOSIS — Z7901 Long term (current) use of anticoagulants: Secondary | ICD-10-CM | POA: Diagnosis not present

## 2019-11-23 DIAGNOSIS — W19XXXA Unspecified fall, initial encounter: Secondary | ICD-10-CM | POA: Diagnosis present

## 2019-11-23 DIAGNOSIS — Z87891 Personal history of nicotine dependence: Secondary | ICD-10-CM | POA: Diagnosis not present

## 2019-11-23 DIAGNOSIS — S15091A Other specified injury of right carotid artery, initial encounter: Secondary | ICD-10-CM | POA: Diagnosis not present

## 2019-11-23 DIAGNOSIS — D649 Anemia, unspecified: Secondary | ICD-10-CM | POA: Diagnosis not present

## 2019-11-23 DIAGNOSIS — Z7984 Long term (current) use of oral hypoglycemic drugs: Secondary | ICD-10-CM | POA: Diagnosis not present

## 2019-11-23 DIAGNOSIS — Y92009 Unspecified place in unspecified non-institutional (private) residence as the place of occurrence of the external cause: Secondary | ICD-10-CM | POA: Diagnosis not present

## 2019-11-23 DIAGNOSIS — S0003XA Contusion of scalp, initial encounter: Secondary | ICD-10-CM | POA: Diagnosis present

## 2019-11-23 DIAGNOSIS — Z79890 Hormone replacement therapy (postmenopausal): Secondary | ICD-10-CM | POA: Diagnosis not present

## 2019-11-23 DIAGNOSIS — Z96642 Presence of left artificial hip joint: Secondary | ICD-10-CM | POA: Diagnosis present

## 2019-11-23 DIAGNOSIS — Z882 Allergy status to sulfonamides status: Secondary | ICD-10-CM | POA: Diagnosis not present

## 2019-11-23 DIAGNOSIS — Z880 Allergy status to penicillin: Secondary | ICD-10-CM | POA: Diagnosis not present

## 2019-11-23 DIAGNOSIS — Z888 Allergy status to other drugs, medicaments and biological substances status: Secondary | ICD-10-CM | POA: Diagnosis not present

## 2019-11-23 DIAGNOSIS — E785 Hyperlipidemia, unspecified: Secondary | ICD-10-CM | POA: Diagnosis present

## 2019-11-23 HISTORY — PX: CAROTID ANGIOGRAPHY: CATH118230

## 2019-11-23 LAB — CBC
HCT: 23.3 % — ABNORMAL LOW (ref 36.0–46.0)
Hemoglobin: 7.3 g/dL — ABNORMAL LOW (ref 12.0–15.0)
MCH: 24.3 pg — ABNORMAL LOW (ref 26.0–34.0)
MCHC: 31.3 g/dL (ref 30.0–36.0)
MCV: 77.7 fL — ABNORMAL LOW (ref 80.0–100.0)
Platelets: 356 10*3/uL (ref 150–400)
RBC: 3 MIL/uL — ABNORMAL LOW (ref 3.87–5.11)
RDW: 15.9 % — ABNORMAL HIGH (ref 11.5–15.5)
WBC: 8.6 10*3/uL (ref 4.0–10.5)
nRBC: 0 % (ref 0.0–0.2)

## 2019-11-23 LAB — PROTIME-INR
INR: 2.1 — ABNORMAL HIGH (ref 0.8–1.2)
Prothrombin Time: 23.6 seconds — ABNORMAL HIGH (ref 11.4–15.2)

## 2019-11-23 LAB — GLUCOSE, CAPILLARY
Glucose-Capillary: 120 mg/dL — ABNORMAL HIGH (ref 70–99)
Glucose-Capillary: 114 mg/dL — ABNORMAL HIGH (ref 70–99)

## 2019-11-23 LAB — BASIC METABOLIC PANEL
Anion gap: 8 (ref 5–15)
BUN: 10 mg/dL (ref 8–23)
CO2: 22 mmol/L (ref 22–32)
Calcium: 9 mg/dL (ref 8.9–10.3)
Chloride: 103 mmol/L (ref 98–111)
Creatinine, Ser: 0.78 mg/dL (ref 0.44–1.00)
GFR calc Af Amer: >60 mL/min (ref >60)
GFR calc non Af Amer: >60 mL/min (ref >60)
Glucose, Bld: 158 mg/dL — ABNORMAL HIGH (ref 70–99)
Potassium: 4.2 mmol/L (ref 3.5–5.1)
Sodium: 133 mmol/L — ABNORMAL LOW (ref 135–145)

## 2019-11-23 LAB — SARS CORONAVIRUS 2 (TAT 6-24 HRS): SARS Coronavirus 2: NEGATIVE

## 2019-11-23 LAB — RESPIRATORY PANEL BY RT PCR (FLU A&B, COVID)
Influenza A by PCR: NEGATIVE
Influenza B by PCR: NEGATIVE
SARS Coronavirus 2 by RT PCR: NEGATIVE

## 2019-11-23 LAB — HEMOGLOBIN A1C
Hgb A1c MFr Bld: 6.1 % — ABNORMAL HIGH (ref 4.8–5.6)
Mean Plasma Glucose: 128.37 mg/dL

## 2019-11-23 SURGERY — CAROTID ANGIOGRAPHY
Anesthesia: Moderate Sedation | Laterality: Right

## 2019-11-23 MED ORDER — ACETAMINOPHEN 500 MG PO TABS: 500.0000 mg | ORAL_TABLET | Freq: Four times a day (QID) | ORAL | Status: DC | PRN

## 2019-11-23 MED ORDER — FAMOTIDINE 20 MG PO TABS: 40.0000 mg | ORAL_TABLET | Freq: Once | ORAL | Status: DC | PRN

## 2019-11-23 MED ORDER — SODIUM CHLORIDE 0.9% IV SOLUTION: Freq: Once | INTRAVENOUS | Status: DC

## 2019-11-23 MED ORDER — ONDANSETRON HCL 4 MG/2ML IJ SOLN: 4.0000 mg | Freq: Four times a day (QID) | INTRAMUSCULAR | Status: DC | PRN

## 2019-11-23 MED ORDER — CLINDAMYCIN PHOSPHATE 300 MG/50ML IV SOLN: 300.0000 mg | Freq: Once | INTRAVENOUS | Status: AC

## 2019-11-23 MED ORDER — FENTANYL CITRATE (PF) 100 MCG/2ML IJ SOLN
INTRAMUSCULAR | Status: DC | PRN
  Administered 2019-11-23: 12.5 ug via INTRAVENOUS
  Administered 2019-11-23 (×3): 25 ug via INTRAVENOUS

## 2019-11-23 MED ORDER — HYDROMORPHONE HCL 1 MG/ML IJ SOLN: 1.0000 mg | Freq: Once | INTRAMUSCULAR | Status: DC | PRN

## 2019-11-23 MED ORDER — HEPARIN SODIUM (PORCINE) 1000 UNIT/ML IJ SOLN
INTRAMUSCULAR | Status: AC
  Filled 2019-11-23: qty 1

## 2019-11-23 MED ORDER — DIPHENHYDRAMINE HCL 50 MG/ML IJ SOLN: 50.0000 mg | Freq: Once | INTRAMUSCULAR | Status: DC | PRN

## 2019-11-23 MED ORDER — SODIUM CHLORIDE 0.9 % IV SOLN: INTRAVENOUS | Status: DC

## 2019-11-23 MED ORDER — MIDAZOLAM HCL 5 MG/5ML IJ SOLN
INTRAMUSCULAR | Status: AC
  Filled 2019-11-23: qty 5

## 2019-11-23 MED ORDER — CLINDAMYCIN PHOSPHATE 300 MG/50ML IV SOLN
INTRAVENOUS | Status: AC
  Administered 2019-11-23: 300 mg via INTRAVENOUS
  Filled 2019-11-23: qty 50

## 2019-11-23 MED ORDER — HEPARIN SODIUM (PORCINE) 1000 UNIT/ML IJ SOLN
INTRAMUSCULAR | Status: DC | PRN
  Administered 2019-11-23: 2000 [IU] via INTRAVENOUS

## 2019-11-23 MED ORDER — IODIXANOL 320 MG/ML IV SOLN
INTRAVENOUS | Status: DC | PRN
  Administered 2019-11-23: 60 mL via INTRA_ARTERIAL

## 2019-11-23 MED ORDER — GABAPENTIN 300 MG PO CAPS
300.0000 mg | ORAL_CAPSULE | Freq: Once | ORAL | Status: AC
  Administered 2019-11-23: 300 mg via ORAL
  Filled 2019-11-23: qty 1

## 2019-11-23 MED ORDER — MIDAZOLAM HCL 2 MG/2ML IJ SOLN
INTRAMUSCULAR | Status: DC | PRN
  Administered 2019-11-23 (×2): 0.5 mg via INTRAVENOUS
  Administered 2019-11-23: 1 mg via INTRAVENOUS
  Administered 2019-11-23: 0.5 mg via INTRAVENOUS

## 2019-11-23 MED ORDER — MIDAZOLAM HCL 2 MG/ML PO SYRP: 8.0000 mg | ORAL_SOLUTION | Freq: Once | ORAL | Status: DC | PRN

## 2019-11-23 MED ORDER — METHYLPREDNISOLONE SODIUM SUCC 125 MG IJ SOLR: 125.0000 mg | Freq: Once | INTRAMUSCULAR | Status: DC | PRN

## 2019-11-23 MED ORDER — FENTANYL CITRATE (PF) 100 MCG/2ML IJ SOLN
INTRAMUSCULAR | Status: AC
  Filled 2019-11-23: qty 2

## 2019-11-23 SURGICAL SUPPLY — 25 items
CATH ANGIO 5F 100CM .035 PIG (CATHETERS) ×2 IMPLANT
CATH BEACON 5 .035 100 JB1 TIP (CATHETERS) ×2 IMPLANT
CATH LANTERN 025 150CM 45TIP (MICROCATHETER) ×2 IMPLANT
CATH MICROCATH PRGRT 2.8F 130 (MICROCATHETER) IMPLANT
CATH VERT 5FR 125CM (CATHETERS) ×2 IMPLANT
COIL 400 COMPLEX SOFT 2X4CM (Vascular Products) ×2 IMPLANT
COIL 400 COMPLEX SOFT 3X5CM (Vascular Products) ×2 IMPLANT
DEVICE OCCLUSION PODJ15 (Vascular Products) IMPLANT
DEVICE STARCLOSE SE CLOSURE (Vascular Products) ×2 IMPLANT
DEVICE TORQUE .025-.038 (MISCELLANEOUS) ×2 IMPLANT
GLIDEWIRE ADV .035X260CM (WIRE) ×2 IMPLANT
GLIDEWIRE ANGLED SS 035X260CM (WIRE) ×2 IMPLANT
GUIDEWIRE PFTE-COATED .018X300 (WIRE) ×2 IMPLANT
HANDLE DETACHMENT COIL (MISCELLANEOUS) ×2 IMPLANT
MICROCATH PROGREAT 2.8F 130CM (MICROCATHETER) ×3
NDL ENTRY 21GA 7CM ECHOTIP (NEEDLE) IMPLANT
NEEDLE ENTRY 21GA 7CM ECHOTIP (NEEDLE) ×3 IMPLANT
OCCLUSION DEVICE PODJ15 (Vascular Products) IMPLANT
PACK ANGIOGRAPHY (CUSTOM PROCEDURE TRAY) ×2 IMPLANT
SET INTRO CAPELLA COAXIAL (SET/KITS/TRAYS/PACK) ×2 IMPLANT
SHEATH ANL 5FRX90 (SHEATH) ×2 IMPLANT
SHEATH BRITE TIP 5FRX11 (SHEATH) ×2 IMPLANT
SHEATH SHUTTLE 6FR (SHEATH) ×2 IMPLANT
WIRE G VAS 035X260 STIFF (WIRE) ×2 IMPLANT
WIRE J 3MM .035X145CM (WIRE) ×2 IMPLANT

## 2019-11-23 NOTE — Consult Note (Signed)
[facility] Vascular Consult Note  MRN : [id number]  Monica [name] is a 86 y.o. (1926/03/25) female who presents with chief complaint of  Chief Complaint  Patient presents with   Head Injury   History of Present Illness:  The patient is a 86 year old female with multiple medical issues (see below) who presented to the [facility] emergency department with a chief complaint of a "head injury".  Patient endorses experiencing a mechanical fall on October 24, 2019.  She describes falling backwards and striking her head.  The patient presented at that time for laceration repair.  Since her initial fall, the patient has presented to the emergency department approximately 4 times for bleeding from the area of injury (posterior scalp).  Of note, the patient is on Coumadin for atrial fibrillation.  Denies any additional head trauma.  She denies any strokelike symptoms, neurological deficits, fever, nausea vomiting.  She denies any chest pain or shortness of breath.  Upon arrival to the emergency department she was found to have a hemoglobin of 7.4 which trended down to 6.6 requiring a transfusion of 2 units of packed red blood cells.  Posttransfusion hemoglobin 7.3.  INR was 3.3 she received vitamin K with a repeat INR of 2.1.  CTA head (11/22/19): 1. Right parietal scalp hematoma with arterial contrast blush arising from a small branch of the right occipital artery. 2. No intracranial arterial occlusion or high-grade stenosis. 3. No acute intracranial abnormality.  Vascular surgery was consulted by Dr. Hampton Abbot for possible endovascular intervention.  Current Facility-Administered Medications  Medication Dose Route Frequency Provider Last Rate Last Admin   0.9 %  sodium chloride infusion  10 mL/hr Intravenous Once Harvest Dark, MD       0.9 %  sodium chloride infusion   Intravenous Continuous Sascha Baugher A, PA-C 75 mL/hr at 11/23/19 0847  New Bag at 11/23/19 0847   acetaminophen (TYLENOL) tablet 500 mg  500 mg Oral Q6H PRN Athena Masse, MD       insulin aspart (novoLOG) injection 0-15 Units  0-15 Units Subcutaneous TID WC Athena Masse, MD       ondansetron [facility]) tablet 4 mg  4 mg Oral Q6H PRN Athena Masse, MD       Or   ondansetron [facility]) injection 4 mg  4 mg Intravenous Q6H PRN Athena Masse, MD       Current Outpatient Medications  Medication Sig Dispense Refill   albuterol (VENTOLIN HFA) 108 (90 Base) MCG/ACT inhaler Inhale 2 puffs into the lungs every 4 (four) hours as needed for wheezing or shortness of breath. 1 Inhaler 5   amLODipine-benazepril (LOTREL) 5-20 MG capsule Take 1 capsule by mouth daily.      b complex vitamins capsule Take 1 capsule by mouth daily.     desloratadine (CLARINEX) 5 MG tablet Take 1 tablet (5 mg total) by mouth daily. 30 tablet 3   famotidine (PEPCID) 20 MG tablet Take 1 tablet (20 mg total) by mouth 2 (two) times daily. 30 tablet 0   fluticasone furoate-vilanterol (BREO ELLIPTA) 100-25 MCG/INH AEPB TAKE 1 PUFF ONCE A DAY 60 each 3   furosemide (LASIX) 20 MG tablet Take 20 mg by mouth daily.      gabapentin (NEURONTIN) 300 MG capsule Take 2 capsules (600 mg total) by mouth at bedtime. 60 capsule 5   glipiZIDE (GLUCOTROL XL) 2.5 MG 24 hr tablet Take 1 tablet (2.5 mg total) by mouth  daily. 90 tablet 1   glucose blood (ONETOUCH ULTRA) test strip 1 each by Other route daily. Use as instructed 100 each 3   hydrochlorothiazide (HYDRODIURIL) 25 MG tablet      levofloxacin (LEVAQUIN) 500 MG tablet Take 1 tablet (500 mg total) by mouth daily. 14 tablet 0   Liniments (SALONPAS PAIN RELIEF PATCH EX) Apply topically.     Magnesium 200 MG TABS Take 1 tablet (200 mg total) by mouth daily. 30 tablet 5   metFORMIN (GLUCOPHAGE-XR) 500 MG 24 hr tablet Take 2 tablets (1,000 mg total) by mouth daily. 135 tablet 2   montelukast (SINGULAIR) 10 MG tablet Take 10 mg by mouth at  bedtime.     nystatin cream (MYCOSTATIN) Apply topically 2 (two) times daily. 30 g 0   oxybutynin (DITROPAN-XL) 10 MG 24 hr tablet Take 1 tablet (10 mg total) by mouth daily. 30 tablet 3   pravastatin (PRAVACHOL) 40 MG tablet Take 1 tablet (40 mg total) by mouth daily. 90 tablet 1   sodium chloride 1 g tablet Take 1 tablet (1 g total) by mouth 3 (three) times daily with meals.     theophylline (THEO-24) 100 MG 24 hr capsule TAKE 1 CAPSULE BY MOUTH EVERY DAY 30 capsule 3   triamcinolone (KENALOG) 0.025 % cream Apply 1 application topically 2 (two) times daily. Ok to mix with topical lotion and apply to all affected areas. 80 g 3   warfarin (COUMADIN) 4 MG tablet TAKE ONE TABLET EVERY DAY EXCEPT MONDAY TAKE 1/2 TABLET (Patient taking differently: Take 2-4 mg by mouth as directed. TAKE ONE TABLET EVERY DAY EXCEPT MONDAY TAKE 1/2 TABLET) 30 tablet 5   Past Medical History:  Diagnosis Date   Atrial fibrillation (HCC)    Bronchitis    Cancer (HCC)    Left leg growth, kidneys, lungs and breasts   Carcinoma of unknown primary ([place])    COPD (chronic obstructive pulmonary disease) ([place])    Diabetes mellitus without complication ([place])    Hyperlipidemia    Hypertension    Pancreatitis    Pneumonia    Stroke ([place])    TIA's   Vitamin B12 deficiency    Past Surgical History:  Procedure Laterality Date   ABDOMINAL HYSTERECTOMY  1975   BLADDER SUSPENSION  1989   COLONOSCOPY     COLONOSCOPY WITH PROPOFOL N/A 01/25/2018   Procedure: COLONOSCOPY WITH PROPOFOL;  Surgeon: [name], MD;  Location: [facility] ENDOSCOPY;  Service: Endoscopy;  Laterality: N/A;   ESOPHAGOGASTRODUODENOSCOPY (EGD) WITH PROPOFOL N/A 01/24/2018   Procedure: ESOPHAGOGASTRODUODENOSCOPY (EGD) WITH PROPOFOL;  Surgeon: [name], MD;  Location[facility] ENDOSCOPY;  Service: Endoscopy;  Laterality: N/A;   HIP ARTHROPLASTY Left 06/03/2019   Procedure: ARTHROPLASTY BIPOLAR HIP (HEMIARTHROPLASTY);  Surgeon: Dereck Leep, MD;  Location: [facility] ORS;  Service: Orthopedics;  Laterality: Left;   Social History Social History   Tobacco Use   Smoking status: Former Smoker    Packs/day: 0.50    Years: 15.00    Pack years: 7.50    Types: Cigarettes    Quit date: 11/15/1988    Years since quitting: 31.0   Smokeless tobacco: Never Used  Substance Use Topics   Alcohol use: No    Alcohol/week: 0.0 standard drinks   Drug use: No   Family History Family History  Problem Relation Age of Onset   Cancer Father    Diabetes Brother   Denies family history of peripheral artery disease, venous disease, renal disease or  bleeding/clotting disorders.  Allergies  Allergen Reactions   Sulfa Antibiotics Swelling   Celecoxib Nausea And Vomiting   Acetaminophen Itching   Codeine Rash   Lyrica [Pregabalin] Rash   Penicillin G Rash    Has patient had a PCN reaction causing immediate rash, facial/tongue/throat swelling, SOB or lightheadedness with hypotension: Yes Has patient had a PCN reaction causing severe rash involving mucus membranes or skin necrosis: No Has patient had a PCN reaction that required hospitalization: No Has patient had a PCN reaction occurring within the last 10 years: Unknown If all of the above answers are "NO", then may proceed with Cephalosporin use.   Petrolatum-Zinc Oxide Rash   REVIEW OF SYSTEMS (Negative unless checked)  Constitutional: [] Weight loss  [] Fever  [] Chills Cardiac: [] Chest pain   [] Chest pressure   [] Palpitations   [] Shortness of breath when laying flat   [] Shortness of breath at rest   [] Shortness of breath with exertion. Vascular:  [] Pain in legs with walking   [] Pain in legs at rest   [] Pain in legs when laying flat   [] Claudication   [] Pain in feet when walking  [] Pain in feet at rest  [] Pain in feet when laying flat   [] History of DVT   [] Phlebitis   [] Swelling in legs   [] Varicose veins   [] Non-healing ulcers Pulmonary:   [] Uses home oxygen   [] Productive  cough   [] Hemoptysis   [] Wheeze  [] COPD   [] Asthma Neurologic:  [] Dizziness  [] Blackouts   [] Seizures   [] History of stroke   [] History of TIA  [] Aphasia   [] Temporary blindness   [] Dysphagia   [] Weakness or numbness in arms   [] Weakness or numbness in legs Musculoskeletal:  [] Arthritis   [] Joint swelling   [] Joint pain   [] Low back pain Hematologic:  [x] Easy bruising  [x] Easy bleeding   [] Hypercoagulable state   [] Anemic  [] Hepatitis Gastrointestinal:  [] Blood in stool   [] Vomiting blood  [] Gastroesophageal reflux/heartburn   [] Difficulty swallowing. Genitourinary:  [] Chronic kidney disease   [] Difficult urination  [] Frequent urination  [] Burning with urination   [] Blood in urine Skin:  [] Rashes   [] Ulcers   [x] Wounds Psychological:  [] History of anxiety   []  History of major depression.  Physical Examination  Vitals:   11/23/19 0330 11/23/19 0430 11/23/19 0500 11/23/19 0730  BP: (!) 146/62 (!) 156/56 (!) 158/78 (!) 174/65  Pulse: 89 93 98 98  Resp:    16  Temp:      TempSrc:      SpO2: 100% 100% 100% 100%  Weight:      Height:       Body mass index is 25.23 kg/m. Gen:  WD/WN, NAD Head: [place]/AT, No temporalis wasting. Prominent temp pulse not noted.  Right Occipital: Laceration noted. No active bleeding.     Ear/Nose/Throat: Hearing grossly intact, nares w/o erythema or drainage, oropharynx w/o Erythema/Exudate Eyes: Sclera non-icteric, conjunctiva clear Neck: Trachea midline.  No JVD.  Pulmonary:  Good air movement, respirations not labored, equal bilaterally.  Cardiac: RRR, normal S1, S2. Vascular:  Vessel Right Left  Radial Palpable Palpable  Ulnar Palpable Palpable  Brachial Palpable Palpable                           Gastrointestinal: soft, non-tender/non-distended. No guarding/reflex.  Musculoskeletal: M/S 5/5 throughout.  Extremities without ischemic changes.  No deformity or atrophy. No edema. Neurologic: Sensation grossly intact in extremities.  Symmetrical.   Speech is fluent.  Motor exam as listed above. Psychiatric: Judgment intact, Mood & affect appropriate for pt's clinical situation. Dermatologic: As above Lymph : No Cervical, Axillary, or Inguinal lymphadenopathy.  CBC Lab Results  Component Value Date   WBC 8.6 11/23/2019   HGB 7.3 (L) 11/23/2019   HCT 23.3 (L) 11/23/2019   MCV 77.7 (L) 11/23/2019   PLT 356 11/23/2019   BMET    Component Value Date/Time   NA 135 11/22/2019 1902   NA 136 03/29/2014 0609   K 4.2 11/22/2019 1902   K 4.0 03/29/2014 0609   CL 98 11/22/2019 1902   CL 101 03/29/2014 0609   CO2 25 11/22/2019 1902   CO2 27 03/29/2014 0609   GLUCOSE 121 (H) 11/22/2019 1902   GLUCOSE 226 (H) 03/29/2014 0609   BUN 16 11/22/2019 1902   BUN 28 (H) 03/29/2014 0609   CREATININE 0.86 11/22/2019 1902   CREATININE 1.29 03/29/2014 0609   CALCIUM 9.8 11/22/2019 1902   CALCIUM 9.4 03/29/2014 0609   GFRNONAA 59 (L) 11/22/2019 1902   GFRNONAA 37 (L) 03/29/2014 0609   GFRAA >60 11/22/2019 1902   GFRAA 43 (L) 03/29/2014 0609   Estimated Creatinine Clearance: 40 mL/min (by C-G formula based on SCr of 0.86 mg/dL).  COAG Lab Results  Component Value Date   INR 2.1 (H) 11/23/2019   INR 3.3 (H) 11/22/2019   INR 2.4 (H) 11/06/2019   Radiology CT Angio Head W or Wo Contrast  Result Date: 11/22/2019 CLINICAL DATA:  Scalp hemorrhage EXAM: CT ANGIOGRAPHY HEAD TECHNIQUE: Multidetector CT imaging of the head was performed using the standard protocol during bolus administration of intravenous contrast. Multiplanar CT image reconstructions and MIPs were obtained to evaluate the vascular anatomy. CONTRAST:  39mL OMNIPAQUE IOHEXOL 350 MG/ML SOLN COMPARISON:  None. FINDINGS: CT HEAD Brain: There is no mass, hemorrhage or extra-axial collection. The size and configuration of the ventricles and extra-axial CSF spaces are normal. There is hypoattenuation of the white matter, most commonly indicating chronic small vessel disease. Vascular: No  abnormal hyperdensity of the major intracranial arteries or dural venous sinuses. No intracranial atherosclerosis. Skull: Right parietal scalp hematoma.  No skull fracture. Sinuses/Orbits: No fluid levels or advanced mucosal thickening of the visualized paranasal sinuses. No mastoid or middle ear effusion. The orbits are normal. CTA HEAD POSTERIOR CIRCULATION: --Vertebral arteries: Normal V4 segments. --Posterior inferior cerebellar arteries (PICA): Patent origins from the vertebral arteries. --Anterior inferior cerebellar arteries (AICA): Patent origins from the basilar artery. --Basilar artery: Normal. --Superior cerebellar arteries: Normal. --Posterior cerebral arteries: Normal. Both originate from the basilar artery. Posterior communicating arteries (p-comm) are diminutive or absent. ANTERIOR CIRCULATION: --Intracranial internal carotid arteries: Atherosclerotic calcification of the internal carotid arteries at the skull base without hemodynamically significant stenosis. --Anterior cerebral arteries (ACA): Normal. Both A1 segments are present. Patent anterior communicating artery (a-comm). --Middle cerebral arteries (MCA): Normal. External carotid artery branches: The right parietal scalp hematoma contains an arterial blush of contrast arising from a branch of the right occipital artery. Venous sinuses: Normal Anatomic variants: None IMPRESSION: 1. Right parietal scalp hematoma with arterial contrast blush arising from a small branch of the right occipital artery. 2. No intracranial arterial occlusion or high-grade stenosis. 3. No acute intracranial abnormality. Electronically Signed   By: Ulyses Jarred M.D.   On: 11/22/2019 23:17   CT Head Wo Contrast  Result Date: 10/24/2019 CLINICAL DATA:  Fall with head trauma/laceration. Patient reports taking blood thinners. EXAM: CT HEAD WITHOUT CONTRAST CT CERVICAL SPINE WITHOUT CONTRAST  TECHNIQUE: Multidetector CT imaging of the head and cervical spine was performed  following the standard protocol without intravenous contrast. Multiplanar CT image reconstructions of the cervical spine were also generated. COMPARISON:  MRI brain 12/19/2014 FINDINGS: CT HEAD FINDINGS Brain: Examination demonstrates the ventricles and cisterns to be within normal. There is mild age related atrophic change which is stable. There is chronic ischemic microvascular disease. There is no mass, mass effect, shift of midline structures or acute hemorrhage. No evidence of acute infarction. Vascular: No hyperdense vessel or unexpected calcification. Skull: Normal. Negative for fracture or focal lesion. Sinuses/Orbits: No acute finding. Other: Mild soft tissue swelling over the right posterior parietal scalp. CT CERVICAL SPINE FINDINGS Alignment: Minimal reversal of the normal cervical lordosis. Skull base and vertebrae: Vertebral body heights are maintained. There is moderate spondylosis of the cervical spine. Prominent anterior osteophyte at the C4-5 level. Posterior osteophytes at the C5-6 and C6-7 levels. 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 There is uncovertebral joint spurring and facet arthropathy. Atlantoaxial articulation is unremarkable. Significant left-sided neural foraminal narrowing at the C2-3 and C3-4 levels with bilateral neural foraminal narrowing from the C4-5 level to the C6-7 level. No acute fracture. Soft tissues and spinal canal: Canal stenosis at the C6-7 level due to prominent posterior osteophyte formation. Prevertebral soft tissues are normal. Disc levels: Mild disc space narrowing from the C4-5 level C7-T1 level. Upper chest: Negative. Other: None. IMPRESSION: 1. No acute brain injury. Small right posterior parietal scalp contusion. 2. Mild chronic ischemic microvascular disease and age related atrophic change. 3.  No acute cervical spine injury. 4. Moderate spondylosis of the cervical spine with mild multilevel disc disease as described.  Significant multilevel neural foraminal narrowing as described. Canal stenosis at the C6-7 level probably due to posterior osteophyte formation. Electronically Signed   By: Marin Olp M.D.   On: 10/24/2019 11:44   CT Cervical Spine Wo Contrast  Result Date: 10/24/2019 CLINICAL DATA:  Fall with head trauma/laceration. Patient reports taking blood thinners. EXAM: CT HEAD WITHOUT CONTRAST CT CERVICAL SPINE WITHOUT CONTRAST TECHNIQUE: Multidetector CT imaging of the head and cervical spine was performed following the standard protocol without intravenous contrast. Multiplanar CT image reconstructions of the cervical spine were also generated. COMPARISON:  MRI brain 12/19/2014 FINDINGS: CT HEAD FINDINGS Brain: Examination demonstrates the ventricles and cisterns to be within normal. There is mild age related atrophic change which is stable. There is chronic ischemic microvascular disease. There is no mass, mass effect, shift of midline structures or acute hemorrhage. No evidence of acute infarction. Vascular: No hyperdense vessel or unexpected calcification. Skull: Normal. Negative for fracture or focal lesion. Sinuses/Orbits: No acute finding. Other: Mild soft tissue swelling over the right posterior parietal scalp. CT CERVICAL SPINE FINDINGS Alignment: Minimal reversal of the normal cervical lordosis. Skull base and vertebrae: Vertebral body heights are maintained. There is moderate spondylosis of the cervical spine. Prominent anterior osteophyte at the C4-5 level. Posterior osteophytes at the C5-6 and C6-7 levels. 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 There is uncovertebral joint spurring and facet arthropathy. Atlantoaxial articulation is unremarkable. Significant left-sided neural foraminal narrowing at the C2-3 and C3-4 levels with bilateral neural foraminal narrowing from the C4-5 level to the C6-7 level. No acute fracture. Soft tissues and spinal canal: Canal stenosis at the  C6-7 level due to prominent posterior osteophyte formation. Prevertebral soft tissues are normal. Disc levels: Mild disc space narrowing from the C4-5 level C7-T1 level.  Upper chest: Negative. Other: None. IMPRESSION: 1. No acute brain injury. Small right posterior parietal scalp contusion. 2. Mild chronic ischemic microvascular disease and age related atrophic change. 3.  No acute cervical spine injury. 4. Moderate spondylosis of the cervical spine with mild multilevel disc disease as described. Significant multilevel neural foraminal narrowing as described. Canal stenosis at the C6-7 level probably due to posterior osteophyte formation. Electronically Signed   By: Marin Olp M.D.   On: 10/24/2019 11:44   Assessment/Plan The patient is a 86 year old female with multiple medical issues (see below) who presented to the [facility] emergency department with a chief complaint of a "head injury".  1. Recurrent bleeding from scalp laceration:  Patient presents with recurrent bleeding from scalp laceration sustained on October 24, 2019.  Of note, the patient is on Coumadin for atrial fibrillation.    CTA head (11/22/19): 1. Right parietal scalp hematoma with arterial contrast blush arising from a small branch of the right occipital artery. 2. No intracranial arterial occlusion or high-grade stenosis. 3. No acute intracranial abnormality.  Contrast arising from small branch of the right occipital artery.  Recommend endovascular intervention in the setting of recurrent bleeding, age and multiple co-morbidities in an attempt to stop bleeding and subsequent anemia.  Procedure, risks and benefits explained to the patient.  All questions answered.  Patient was to proceed.  We will plan on this today with Dr. Delana Meyer.  2.  Atrial fibrillation: Treated with Coumadin.  Has had supratherapeutic INR in the past.  Would consider possibly switching to Eliquis?  Defer to primary team.  3.   Diabetes: On appropriate medications. Encouraged good control as its slows the progression of atherosclerotic disease.  4.  Hyperlipidemia: On statin for medical management. Encouraged good control as its slows the progression of atherosclerotic disease.  Discussed with Dr. Francene Castle, PA-C  11/23/2019 9:28 AM  This note was created with Dragon medical transcription system.  Any error is purely unintentional.

## 2019-11-23 NOTE — ED Notes (Signed)
Signature pad in room not working, pt gives consent for blood transfusion

## 2019-11-23 NOTE — ED Notes (Signed)
Patient transported to specials at this time by transport

## 2019-11-23 NOTE — ED Notes (Signed)
Pt asleep in bed at this time.

## 2019-11-23 NOTE — Progress Notes (Addendum)
PROGRESS NOTE                                                                                                                                                                                                             Patient Demographics:    Monica [name], is a 86 y.o. female, DOB - 1926/05/22, [id number]  Admit date - 11/22/2019   Admitting Physician Athena Masse, MD  Outpatient Primary MD for the patient is [name], NP  LOS - 0  Outpatient Specialists: None  Chief Complaint  Patient presents with  . Head Injury       Brief Narrative 86 year old female with chronic A. fib on Coumadin, non-insulin-dependent diabetes mellitus, hypertension who had a fall 1 month back and developed an occipital hematoma presents to the ED for the third time with bleeding from the hematoma.  Denies further falls at home, headache or blurred vision.  Uses walker to ambulate. In the ED vitals were stable, hemoglobin 6.6 with almost 3 g drop, supratherapeutic INR of 3.3.  Hemostasis achieved in the ED with ligation.  Vitamin K given to reverse INR and 1 unit PRBC transfused.  Vascular surgery consulted and patient admitted to hospital service.   Subjective:   Patient complains of some soreness over the hematoma site.  Denies any headache or blurred vision.   Assessment  & Plan :    Principal Problem:   Acute blood loss anemia Secondary to recurrent bleeding scalp hematoma with supratherapeutic INR. 1 unit PRBC transfused and INR reversed with vitamin K.  Monitor serial H&H and INR.  Hemoglobin improved to 7.3 with 1 unit PRBC, will transfuse another unit.  Bleeding right occipital scalp hematoma, recurrent Recurrent bleeding with blood loss anemia.  CT angiogram without intracranial arterial occlusion or stenosis with parietal scalp hematoma with arterial contrast blush arising from small branch of the right occipital artery.   Plan on endovascular intervention in the setting of recurrent bleeding.   Active Problems:    Atrial fibrillation, chronic (HCC) On Coumadin with supratherapeutic INR.  Reversed with vitamin K.  Given history of fall will have PT evaluate patient and if concern for high fall risk may need to discontinue anticoagulation altogether.  If patient is at low fall risk I will switch her Coumadin to Eliquis.    Non-insulin dependent type  2 diabetes mellitus ([place]) Stable on sliding scale coverage   Essential hypertension Stable.  Patient status: Inpatient Patient presenting with recurrent bleeding occipital scalp hematoma with acute blood loss anemia and supratherapeutic INR.  Patient needs surgical intervention/endovascular repair, frequent blood transfusion and close monitoring of further bleeding.  She needs to be monitored in the inpatient setting for at least >2 midnights.  Code Status : Full code  Family Communication  : We will update her daughter  Disposition Plan  : Home possibly in the next 24-48 hours if no further bleeding and H&H stable  Barriers For Discharge : Active symptoms  Consults  : Vascular surgery/IR  Procedures  : CTA  DVT Prophylaxis  : Supratherapeutic INR  Lab Results  Component Value Date   PLT 356 11/23/2019    Antibiotics  :    Anti-infectives (From admission, onward)   Start     Dose/Rate Route Frequency Ordered Stop   11/24/19 0000  clindamycin (CLEOCIN) IVPB 300 mg    Note to Pharmacy: To be given in specials   300 mg 100 mL/hr over 30 Minutes Intravenous  Once 11/23/19 1108     11/23/19 1026  clindamycin (CLEOCIN) 300 MG/50ML IVPB    Note to Pharmacy: Maynor, Erin   : cabinet override      11/23/19 1026 11/23/19 2229        Objective:   Vitals:   11/23/19 1004 11/23/19 1009 11/23/19 1023 11/23/19 1052  BP:    124/66  Pulse: 96 91 91 83  Resp: (!) 23 (!) 22 (!) 21   Temp:    98.5 F (36.9 C)  TempSrc:    Oral  SpO2: 99% 100% 99% 99%   Weight:    66.7 kg  Height:    5\' 4"  (1.626 m)    Wt Readings from Last 3 Encounters:  11/23/19 66.7 kg  11/14/19 68 kg  11/06/19 68 kg     Intake/Output Summary (Last 24 hours) at 11/23/2019 1419 Last data filed at 11/23/2019 0408 Gross per 24 hour  Intake 686.6 ml  Output 800 ml  Net -113.4 ml     Physical Exam  Gen: not in distress HEENT: Clean dressing over scalp moist mucosa, supple neck Chest: clear b/l, no added sounds CVS: S1-S2 regular, no murmurs GI: soft, NT, ND,  Musculoskeletal: warm, no edema     Data Review:    CBC Recent Labs  Lab 11/22/19 1902 11/22/19 2129 11/23/19 0501  WBC 8.8  --  8.6  HGB 7.4* 6.6* 7.3*  HCT 24.0* 21.8* 23.3*  PLT 420*  --  356  MCV 76.7*  --  77.7*  MCH 23.6*  --  24.3*  MCHC 30.8  --  31.3  RDW 15.3  --  15.9*    Chemistries  Recent Labs  Lab 11/22/19 1902  NA 135  K 4.2  CL 98  CO2 25  GLUCOSE 121*  BUN 16  CREATININE 0.86  CALCIUM 9.8   ------------------------------------------------------------------------------------------------------------------ No results for input(s): CHOL, HDL, LDLCALC, TRIG, CHOLHDL, LDLDIRECT in the last 72 hours.  Lab Results  Component Value Date   HGBA1C 6.1 (H) 11/22/2019   ------------------------------------------------------------------------------------------------------------------ No results for input(s): TSH, T4TOTAL, T3FREE, THYROIDAB in the last 72 hours.  Invalid input(s): FREET3 ------------------------------------------------------------------------------------------------------------------ No results for input(s): VITAMINB12, FOLATE, FERRITIN, TIBC, IRON, RETICCTPCT in the last 72 hours.  Coagulation profile Recent Labs  Lab 11/22/19 1902 11/23/19 0501  INR 3.3* 2.1*    No results for  input(s): DDIMER in the last 72 hours.  Cardiac Enzymes No results for input(s): CKMB, TROPONINI, MYOGLOBIN in the last 168 hours.  Invalid input(s):  CK ------------------------------------------------------------------------------------------------------------------    Component Value Date/Time   BNP 311 03/27/2014 2029    Inpatient Medications  Scheduled Meds: . fentaNYL      . [MAR Hold] insulin aspart  0-15 Units Subcutaneous TID WC  . midazolam       Continuous Infusions: . [MAR Hold] sodium chloride    . sodium chloride 75 mL/hr at 11/23/19 0847  . sodium chloride    . clindamycin    . [START ON 11/24/2019] clindamycin (CLEOCIN) IV     PRN Meds:.[MAR Hold] acetaminophen, diphenhydrAMINE, famotidine, HYDROmorphone (DILAUDID) injection, methylPREDNISolone (SOLU-MEDROL) injection, midazolam, [MAR Hold] ondansetron **OR** [MAR Hold] ondansetron (ZOFRAN) IV, ondansetron (ZOFRAN) IV  Micro Results Recent Results (from the past 240 hour(s))  SARS CORONAVIRUS 2 (TAT 6-24 HRS) Nasopharyngeal Nasopharyngeal Swab     Status: None   Collection Time: 11/22/19 10:28 PM   Specimen: Nasopharyngeal Swab  Result Value Ref Range Status   SARS Coronavirus 2 NEGATIVE NEGATIVE Final    Comment: (NOTE) SARS-CoV-2 target nucleic acids are NOT DETECTED. The SARS-CoV-2 RNA is generally detectable in upper and lower respiratory specimens during the acute phase of infection. Negative results do not preclude SARS-CoV-2 infection, do not rule out co-infections with other pathogens, and should not be used as the sole basis for treatment or other patient management decisions. Negative results must be combined with clinical observations, patient history, and epidemiological information. The expected result is Negative. Fact Sheet for Patients: SugarRoll.be Fact Sheet for Healthcare Providers: https://www.woods-mathews.com/ This test is not yet approved or cleared by the [place] FDA and  has been authorized for detection and/or diagnosis of SARS-CoV-2 by FDA under an Emergency Use Authorization (EUA).  This EUA will remain  in effect (meaning this test can be used) for the duration of the COVID-19 declaration under Section 56 4(b)(1) of the Act, 21 U.S.C. section 360bbb-3(b)(1), unless the authorization is terminated or revoked sooner. Performed at [facility], [place] [address] [place], [place] [address]   Respiratory Panel by RT PCR (Flu A&B, Covid) - Nasopharyngeal Swab     Status: None   Collection Time: 11/23/19  8:51 AM   Specimen: Nasopharyngeal Swab  Result Value Ref Range Status   SARS Coronavirus 2 by RT PCR NEGATIVE NEGATIVE Final    Comment: (NOTE) SARS-CoV-2 target nucleic acids are NOT DETECTED. The SARS-CoV-2 RNA is generally detectable in upper respiratoy specimens during the acute phase of infection. The lowest concentration of SARS-CoV-2 viral copies this assay can detect is 131 copies/mL. A negative result does not preclude SARS-Cov-2 infection and should not be used as the sole basis for treatment or other patient management decisions. A negative result may occur with  improper specimen collection/handling, submission of specimen other than nasopharyngeal swab, presence of viral mutation(s) within the areas targeted by this assay, and inadequate number of viral copies (<131 copies/mL). A negative result must be combined with clinical observations, patient history, and epidemiological information. The expected result is Negative. Fact Sheet for Patients:  PinkCheek.be Fact Sheet for Healthcare Providers:  GravelBags.it This test is not yet ap proved or cleared by the [place] FDA and  has been authorized for detection and/or diagnosis of SARS-CoV-2 by FDA under an Emergency Use Authorization (EUA). This EUA will remain  in effect (meaning this test can be used) for the duration  of the COVID-19 declaration under Section 564(b)(1) of the Act, 21 U.S.C. section 360bbb-3(b)(1), unless the  authorization is terminated or revoked sooner.    Influenza A by PCR NEGATIVE NEGATIVE Final   Influenza B by PCR NEGATIVE NEGATIVE Final    Comment: (NOTE) The Xpert Xpress SARS-CoV-2/FLU/RSV assay is intended as an aid in  the diagnosis of influenza from Nasopharyngeal swab specimens and  should not be used as a sole basis for treatment. Nasal washings and  aspirates are unacceptable for Xpert Xpress SARS-CoV-2/FLU/RSV  testing. Fact Sheet for Patients: PinkCheek.be Fact Sheet for Healthcare Providers: GravelBags.it This test is not yet approved or cleared by the [place] FDA and  has been authorized for detection and/or diagnosis of SARS-CoV-2 by  FDA under an Emergency Use Authorization (EUA). This EUA will remain  in effect (meaning this test can be used) for the duration of the  Covid-19 declaration under Section 564(b)(1) of the Act, 21  U.S.C. section 360bbb-3(b)(1), unless the authorization is  terminated or revoked. Performed at [facility], [address] [place], [place] [address]     Radiology Reports CT Angio Head W or [place] Contrast  Result Date: 11/22/2019 CLINICAL DATA:  Scalp hemorrhage EXAM: CT ANGIOGRAPHY HEAD TECHNIQUE: Multidetector CT imaging of the head was performed using the standard protocol during bolus administration of intravenous contrast. Multiplanar CT image reconstructions and MIPs were obtained to evaluate the vascular anatomy. CONTRAST:  78mL OMNIPAQUE IOHEXOL 350 MG/ML SOLN COMPARISON:  None. FINDINGS: CT HEAD Brain: There is no mass, hemorrhage or extra-axial collection. The size and configuration of the ventricles and extra-axial CSF spaces are normal. There is hypoattenuation of the white matter, most commonly indicating chronic small vessel disease. Vascular: No abnormal hyperdensity of the major intracranial arteries or dural venous sinuses. No intracranial atherosclerosis.  Skull: Right parietal scalp hematoma.  No skull fracture. Sinuses/Orbits: No fluid levels or advanced mucosal thickening of the visualized paranasal sinuses. No mastoid or middle ear effusion. The orbits are normal. CTA HEAD POSTERIOR CIRCULATION: --Vertebral arteries: Normal V4 segments. --Posterior inferior cerebellar arteries (PICA): Patent origins from the vertebral arteries. --Anterior inferior cerebellar arteries (AICA): Patent origins from the basilar artery. --Basilar artery: Normal. --Superior cerebellar arteries: Normal. --Posterior cerebral arteries: Normal. Both originate from the basilar artery. Posterior communicating arteries (p-comm) are diminutive or absent. ANTERIOR CIRCULATION: --Intracranial internal carotid arteries: Atherosclerotic calcification of the internal carotid arteries at the skull base without hemodynamically significant stenosis. --Anterior cerebral arteries (ACA): Normal. Both A1 segments are present. Patent anterior communicating artery (a-comm). --Middle cerebral arteries (MCA): Normal. External carotid artery branches: The right parietal scalp hematoma contains an arterial blush of contrast arising from a branch of the right occipital artery. Venous sinuses: Normal Anatomic variants: None IMPRESSION: 1. Right parietal scalp hematoma with arterial contrast blush arising from a small branch of the right occipital artery. 2. No intracranial arterial occlusion or high-grade stenosis. 3. No acute intracranial abnormality. Electronically Signed   By: Ulyses Jarred M.D.   On: 11/22/2019 23:17    Time Spent in minutes 35    Barrett Holthaus M.D on 11/23/2019 at 2:19 PM  Between 7am to 7pm - Pager - [phone]  After 7pm go to [email or web address] - password [facility]  [facility] -  Office  [phone]

## 2019-11-23 NOTE — Consult Note (Signed)
[facility] SURGICAL CONSULTATION NOTE (initial) - [id number]   HISTORY OF PRESENT ILLNESS (HPI):  86 y.o. female presented to [facility] ED yesterday for evaluation of bleeding scalp hematoma. Patient had a mechanical fall on 12/09 in which she fell backward and struck her head, no additional injuries. She presented to the ED and had laceration repair. Since that time, she has presented to the ED ~4 times for bleeding from her posterior scalp hematoma. Each time her hemoglobin and INR were monitored but she never required any intervention. Of note, she is on coumadin for atrial fibrillation. She denied any new injuries or trauma prior to this presentation. She reports that she was sleeping and "felt something crawling on her: and when she woke up she realized that she was bleeding again. She called EMS and presented to the ED. No other new complaints. In the ED she was found to have Hgb of 7.4 and trended down to 6.6 and INR was 3.3. She did receive 2 units of pRBCs and hemoglobin this morning has come up to 7.3. She got Vitamin K and INR improved to 2.1. She had CTA of the head which showed arterial blush from the right occipital artery. She was admitted to the medicine service for monitoring given the anemia and recurrent re-bleeding.   Surgery is consulted by emergency medicine physician Dr. Harvest Dark, MD in this context for evaluation and management of bleeding scalp hematoma.   PAST MEDICAL HISTORY (PMH):  Past Medical History:  Diagnosis Date  . Atrial fibrillation ([place])   . Bronchitis   . Cancer ([place])    Left leg growth, kidneys, lungs and breasts  . Carcinoma of unknown primary ([place])   . COPD (chronic obstructive pulmonary disease) ([place])   . Diabetes mellitus without complication ([place])   . Hyperlipidemia   . Hypertension   . Pancreatitis   . Pneumonia   . Stroke ([place])    TIA's  . Vitamin B12 deficiency      PAST SURGICAL HISTORY ([place]):  Past Surgical History:   Procedure Laterality Date  . ABDOMINAL HYSTERECTOMY  1975  . BLADDER SUSPENSION  1989  . COLONOSCOPY    . COLONOSCOPY WITH PROPOFOL N/A 01/25/2018   Procedure: COLONOSCOPY WITH PROPOFOL;  Surgeon: [name], MD;  Location: [facility] ENDOSCOPY;  Service: Endoscopy;  Laterality: N/A;  . ESOPHAGOGASTRODUODENOSCOPY (EGD) WITH PROPOFOL N/A 01/24/2018   Procedure: ESOPHAGOGASTRODUODENOSCOPY (EGD) WITH PROPOFOL;  Surgeon: [name], MD;  Location: [facility] ENDOSCOPY;  Service: Endoscopy;  Laterality: N/A;  . HIP ARTHROPLASTY Left 06/03/2019   Procedure: ARTHROPLASTY BIPOLAR HIP (HEMIARTHROPLASTY);  Surgeon: Dereck Leep, MD;  Location: [facility] ORS;  Service: Orthopedics;  Laterality: Left;     MEDICATIONS:  Prior to Admission medications   Medication Sig Start Date End Date Taking? Authorizing Provider  albuterol (VENTOLIN HFA) 108 (90 Base) MCG/ACT inhaler Inhale 2 puffs into the lungs every 4 (four) hours as needed for wheezing or shortness of breath. 03/06/19   Boscia, Heather E, NP  amLODipine-benazepril (LOTREL) 5-20 MG capsule Take 1 capsule by mouth daily.  06/18/19   [provider]  b complex vitamins capsule Take 1 capsule by mouth daily.    [provider]  desloratadine (CLARINEX) 5 MG tablet Take 1 tablet (5 mg total) by mouth daily. 11/20/19   [name], NP  famotidine (PEPCID) 20 MG tablet Take 1 tablet (20 mg total) by mouth 2 (two) times daily. 08/11/16   Epifanio Lesches, MD  fluticasone furoate-vilanterol (  BREO ELLIPTA) 100-25 MCG/INH AEPB TAKE 1 PUFF ONCE A DAY 09/14/19   Boscia, Heather E, NP  furosemide (LASIX) 20 MG tablet Take 20 mg by mouth daily.  05/24/16   [provider]  gabapentin (NEURONTIN) 300 MG capsule Take 2 capsules (600 mg total) by mouth at bedtime. 09/14/19   [name], NP  glipiZIDE (GLUCOTROL XL) 2.5 MG 24 hr tablet Take 1 tablet (2.5 mg total) by mouth daily. 11/20/19   [name], NP  glucose blood (ONETOUCH ULTRA)  test strip 1 each by Other route daily. Use as instructed 07/26/19   [name], NP  hydrochlorothiazide (HYDRODIURIL) 25 MG tablet  06/18/19   [provider]  levofloxacin (LEVAQUIN) 500 MG tablet Take 1 tablet (500 mg total) by mouth daily. 10/26/19   [name], NP  Liniments (SALONPAS PAIN RELIEF PATCH EX) Apply topically.    [provider]  Magnesium 200 MG TABS Take 1 tablet (200 mg total) by mouth daily. 10/26/19   [name], NP  metFORMIN (GLUCOPHAGE-XR) 500 MG 24 hr tablet Take 2 tablets (1,000 mg total) by mouth daily. 08/15/19   [name], NP  montelukast (SINGULAIR) 10 MG tablet Take 10 mg by mouth at bedtime.    [provider]  nystatin cream (MYCOSTATIN) Apply topically 2 (two) times daily. 07/18/19   [name], NP  oxybutynin (DITROPAN-XL) 10 MG 24 hr tablet Take 1 tablet (10 mg total) by mouth daily. 10/18/19   [name], NP  pravastatin (PRAVACHOL) 40 MG tablet Take 1 tablet (40 mg total) by mouth daily. 08/15/19   [name], NP  sodium chloride 1 g tablet Take 1 tablet (1 g total) by mouth 3 (three) times daily with meals. 06/09/19   Dustin Flock, MD  theophylline (THEO-24) 100 MG 24 hr capsule TAKE 1 CAPSULE BY MOUTH EVERY DAY 09/14/19   [name], NP  triamcinolone (KENALOG) 0.025 % cream Apply 1 application topically 2 (two) times daily. Ok to mix with topical lotion and apply to all affected areas. 05/07/19   [name], NP  warfarin (COUMADIN) 4 MG tablet TAKE ONE TABLET EVERY DAY EXCEPT MONDAY TAKE 1/2 TABLET Patient taking differently: Take 2-4 mg by mouth as directed. TAKE ONE TABLET EVERY DAY EXCEPT MONDAY TAKE 1/2 TABLET 10/18/19   [name], NP     ALLERGIES:  Allergies  Allergen Reactions  . Sulfa Antibiotics Swelling  . Celecoxib Nausea And Vomiting  . Acetaminophen Itching  . Codeine Rash  . Lyrica [Pregabalin] Rash  . Penicillin G Rash    Has patient had a PCN  reaction causing immediate rash, facial/tongue/throat swelling, SOB or lightheadedness with hypotension: Yes Has patient had a PCN reaction causing severe rash involving mucus membranes or skin necrosis: No Has patient had a PCN reaction that required hospitalization: No Has patient had a PCN reaction occurring within the last 10 years: Unknown If all of the above answers are "NO", then may proceed with Cephalosporin use.  Marland Kitchen Petrolatum-Zinc Oxide Rash     SOCIAL HISTORY:  Social History   Socioeconomic History  . Marital status: Divorced    Spouse name: Not on file  . Number of children: Not on file  . Years of education: Not on file  . Highest education level: Not on file  Occupational History  . Not on file  Tobacco Use  . Smoking status: Former Smoker    Packs/day: 0.50  Years: 15.00    Pack years: 7.50    Types: Cigarettes    Quit date: 11/15/1988    Years since quitting: 31.0  . Smokeless tobacco: Never Used  Substance and Sexual Activity  . Alcohol use: No    Alcohol/week: 0.0 standard drinks  . Drug use: No  . Sexual activity: Not on file    Comment: hysterectomy   Other Topics Concern  . Not on file  Social History Narrative  . Not on file   Social Determinants of Health   Financial Resource Strain:   . Difficulty of Paying Living Expenses: Not on file  Food Insecurity:   . Worried About Charity fundraiser in the Last Year: Not on file  . Ran Out of Food in the Last Year: Not on file  Transportation Needs:   . Lack of Transportation (Medical): Not on file  . Lack of Transportation (Non-Medical): Not on file  Physical Activity:   . Days of Exercise per Week: Not on file  . Minutes of Exercise per Session: Not on file  Stress:   . Feeling of Stress : Not on file  Social Connections:   . Frequency of Communication with Friends and Family: Not on file  . Frequency of Social Gatherings with Friends and Family: Not on file  . Attends Religious Services: Not  on file  . Active Member of Clubs or Organizations: Not on file  . Attends Archivist Meetings: Not on file  . Marital Status: Not on file  Intimate Partner Violence:   . Fear of Current or Ex-Partner: Not on file  . Emotionally Abused: Not on file  . Physically Abused: Not on file  . Sexually Abused: Not on file     FAMILY HISTORY:  Family History  Problem Relation Age of Onset  . Cancer Father   . Diabetes Brother       REVIEW OF SYSTEMS:  Review of Systems  Constitutional: Negative for chills and fever.  HENT: Negative for congestion and sore throat.   Respiratory: Negative for cough and shortness of breath.   Cardiovascular: Negative for chest pain and palpitations.  Gastrointestinal: Negative for abdominal pain, nausea and vomiting.  Musculoskeletal: Positive for falls (12/09).  Skin:       + Scalp Hematoma  Neurological: Negative for loss of consciousness and weakness.  All other systems reviewed and are negative.   VITAL SIGNS:  Temp:  [97.7 F (36.5 C)-98.4 F (36.9 C)] 98 F (36.7 C) (01/08 0208) Pulse Rate:  [87-98] 98 (01/08 0500) Resp:  [15-18] 16 (01/08 0208) BP: (128-164)/(51-90) 158/78 (01/08 0500) SpO2:  [99 %-100 %] 100 % (01/08 0500) Weight:  [66.7 kg] 66.7 kg (01/07 1839)     Height: 5\' 4"  (162.6 cm) Weight: 66.7 kg BMI (Calculated): 25.22   INTAKE/OUTPUT:  01/07 0701 - 01/08 0700 In: 686.6 [Blood:640; IV Piggyback:46.6] Out: 800 [Urine:800]  PHYSICAL EXAM:  Physical Exam Vitals and nursing note reviewed.  Constitutional:      General: She is not in acute distress.    Appearance: Normal appearance. She is normal weight. She is not ill-appearing.  HENT:     Head: Laceration present.   Eyes:     General: No scleral icterus.    Conjunctiva/sclera: Conjunctivae normal.  Cardiovascular:     Rate and Rhythm: Normal rate and regular rhythm.     Pulses: Normal pulses.     Heart sounds: No murmur. No friction rub. No gallop.  Pulmonary:     Effort: Pulmonary effort is normal. No respiratory distress.     Breath sounds: Normal breath sounds.  Genitourinary:    Comments: Deferred Musculoskeletal:     Right lower leg: No edema.     Left lower leg: No edema.  Neurological:     General: No focal deficit present.     Mental Status: She is alert and oriented to person, place, and time.  Psychiatric:        Mood and Affect: Mood normal.        Behavior: Behavior normal.     Posterior Scalp (11/23/2019):      Labs:  CBC Latest Ref Rng & Units 11/23/2019 11/22/2019 11/22/2019  WBC 4.0 - 10.5 K/uL 8.6 - 8.8  Hemoglobin 12.0 - 15.0 g/dL 7.3(L) 6.6(L) 7.4(L)  Hematocrit 36.0 - 46.0 % 23.3(L) 21.8(L) 24.0(L)  Platelets 150 - 400 K/uL 356 - 420(H)   CMP Latest Ref Rng & Units 11/22/2019 11/06/2019 10/24/2019  Glucose 70 - 99 mg/dL 121(H) 124(H) 106(H)  BUN 8 - 23 mg/dL 16 27(H) 15  Creatinine 0.44 - 1.00 mg/dL 0.86 1.23(H) 0.93  Sodium 135 - 145 mmol/L 135 132(L) 127(L)  Potassium 3.5 - 5.1 mmol/L 4.2 3.7 4.5  Chloride 98 - 111 mmol/L 98 97(L) 91(L)  CO2 22 - 32 mmol/L 25 26 24   Calcium 8.9 - 10.3 mg/dL 9.8 9.2 9.2  Total Protein 6.5 - 8.1 g/dL - - 6.7  Total Bilirubin 0.3 - 1.2 mg/dL - - 0.6  Alkaline Phos 38 - 126 U/L - - 105  AST 15 - 41 U/L - - 25  ALT 0 - 44 U/L - - 14     Imaging studies:   CTA Head (11/23/2119) personally reviewed which showed possible contrast blush in the posterior scalp, and radiologist report reviewed:  IMPRESSION: 1. Right parietal scalp hematoma with arterial contrast blush arising from a small branch of the right occipital artery. 2. No intracranial arterial occlusion or high-grade stenosis. 3. No acute intracranial abnormality.   Assessment/Plan: (ICD-10's: S47.83XD) 86 y.o. female with recurrent bleeding from posterior scalp hematoma s/p mechanical fall on 0000000, complicated by pertinent comorbidities including history of atrial fibrillation on coumadin with  supratherapeutic INR on presentation.   - Appreciate medicine admission  - May need to consider vascular surgery evaluation for coil embolization  - Correct INR  - Monitor H&H ; transfuse as needed  - No emergent general surgery intervention  - further management per primary team; consider holiday from coumadin vs risk stratify bleeding risk  - DVT prophylaxis; hold  All of the above findings and recommendations were discussed with the patient, and all of patient's questions were answered to her expressed satisfaction.  Thank you for the opportunity to participate in this patient's care.   -- Edison Simon, PA-C [facility] 11/23/2019, 7:31 AM [phone] M-F: 7am - 4pm

## 2019-11-23 NOTE — ED Notes (Signed)
Pt repositioned in bed at this time. Given warm blanket and coffee. No orders for pain meds at this time per admitting MD

## 2019-11-23 NOTE — ED Notes (Signed)
Pt repositioned in bed.

## 2019-11-23 NOTE — Op Note (Signed)
[facility]  Percutaneous Study/Intervention Procedural Note   Date of Surgery: 11/23/2019,4:32 PM  Surgeon:Evelynn Hench, Dolores Lory   Pre-operative Diagnosis: Persistent bleeding from a traumatic occipital head wound  Post-operative diagnosis:  Same  Procedure(s) Performed:  1.  Arch aortogram  2.  Selective injection of the right occipital artery third order catheter placement  3.  Coil embolization distal right occipital artery  4.  Star close right common femoral   Anesthesia: Conscious sedation was administered by the interventional radiology RN under my direct supervision. IV Versed plus fentanyl were utilized. Continuous ECG, pulse oximetry and blood pressure was monitored throughout the entire procedure. Conscious sedation was administered for a total of 1 hour 45 minutes.  Sheath: 6 [place] shuttle right common femoral retrograde  Contrast: 60 cc   Fluoroscopy Time: 18.1 minutes  Indications: Monica [name] is a 86 year old woman who sustained a fall and a traumatic occipital head wound a week or so ago.  Since the initial encounter she is presented to the emergency room several times with persistent bleeding.  She has now had a hemoglobin documented of 7.3 this morning.  This represents a approximately 3 g drop in her blood count.  CT scan for reevaluation of her head and hematoma was obtained earlier today which demonstrated active extravasation from a branch of the occipital artery.  I am asked to consult.  Procedure:  Monica Pulsipher Smithis a 86 y.o. female who was identified and appropriate procedural time out was performed.  The patient was then placed supine on the table and prepped and draped in the usual sterile fashion.  Ultrasound was used to evaluate the right common femoral artery.  It was echolucent and pulsatile indicating it is patent .  An ultrasound image was acquired for the permanent record.  A micropuncture needle was used to access the right common  femoral artery under direct ultrasound guidance.  The microwire was then advanced under fluoroscopic guidance without difficulty followed by the micro-sheath.  A 0.035 J wire was advanced without resistance and a 5Fr sheath was placed.    The pigtail catheter and advantage wire were then negotiated to the ascending aorta and an LAO projection of the aortic arch is obtained.  JB 1 catheter and advantage wire the negotiated first into the innominate and then the common carotid was selected.  Several views of the carotid bifurcation was then obtained to determine the best working angle and the advantage wire and JB 1 catheter were negotiated into the external carotid artery.  Amplatz wire was then advanced under fluoroscopic guidance into the JB 1 which was removed and the 5 French sheath was exchanged for a 90 cm shuttle sheath.  Initial attempts at selecting the occipital artery with a prograde catheter alone were not successful and the advantage wire was reintroduced followed by a Kumpe catheter.  Successful selection of the occipital artery was then achieved.  With selective injection as well as injection in the carotid bifurcation active extravasation was easily identified.  Now that we had purchase in the occipital artery the 035 advantage wire exchanged for an 018 advantage wire.  Subsequently a lantern catheter was advanced to the distal occipital artery.  2 coils were deployed initially a 3 x 5 soft Ruby coil was deployed in the distal occipital and subsequently a packing coil was attempted but this did not reform well and this was removed and a 2 x 4 soft Ruby coil was deployed.  Follow-up imaging demonstrated occlusion  of this distal branch and now there is no longer any indication of extravasation.  Findings:   Arch aortogram: The aortic arch is opacified with a bolus injection of contrast.  It is type I arch.  There is no evidence of hemodynamically significant atherosclerotic plaque.  Actually  there is minimal atherosclerotic changes throughout her entire arterial tree.  Right carotid: The right common carotid carotid bifurcation internal and external carotid arteries are opacified with contrast.  They are free of atherosclerotic occlusive disease of hemodynamic significance there is there is some very mild plaque noted at the bifurcation itself.  This is trivial.  Selective injection of contrast at the level of the carotid bulb does demonstrate extravasation from the distalmost occipital artery.  As noted above once we had selected the occipital artery itself the extravasation was quite prominent.  Following coil embolization there is complete cessation of flow to the distal most aspect and there is no longer any evidence of extravasation.   Summary: Successful coil embolization of the right occipital artery for cessation of bleeding.  Disposition: Patient was taken to the recovery room in stable condition having tolerated the procedure well.  Monica [name] 11/23/2019,4:32 PM

## 2019-11-24 DIAGNOSIS — I482 Chronic atrial fibrillation, unspecified: Secondary | ICD-10-CM

## 2019-11-24 LAB — CBC
HCT: 22.3 % — ABNORMAL LOW (ref 36.0–46.0)
Hemoglobin: 7 g/dL — ABNORMAL LOW (ref 12.0–15.0)
MCH: 24.4 pg — ABNORMAL LOW (ref 26.0–34.0)
MCHC: 31.4 g/dL (ref 30.0–36.0)
MCV: 77.7 fL — ABNORMAL LOW (ref 80.0–100.0)
Platelets: 379 10*3/uL (ref 150–400)
RBC: 2.87 MIL/uL — ABNORMAL LOW (ref 3.87–5.11)
RDW: 16 % — ABNORMAL HIGH (ref 11.5–15.5)
WBC: 8.2 10*3/uL (ref 4.0–10.5)
nRBC: 0 % (ref 0.0–0.2)

## 2019-11-24 LAB — PROTIME-INR
INR: 1.3 — ABNORMAL HIGH (ref 0.8–1.2)
Prothrombin Time: 16.1 seconds — ABNORMAL HIGH (ref 11.4–15.2)

## 2019-11-24 LAB — GLUCOSE, CAPILLARY
Glucose-Capillary: 135 mg/dL — ABNORMAL HIGH (ref 70–99)
Glucose-Capillary: 158 mg/dL — ABNORMAL HIGH (ref 70–99)
Glucose-Capillary: 128 mg/dL — ABNORMAL HIGH (ref 70–99)
Glucose-Capillary: 100 mg/dL — ABNORMAL HIGH (ref 70–99)

## 2019-11-24 LAB — BASIC METABOLIC PANEL
Anion gap: 11 (ref 5–15)
BUN: 10 mg/dL (ref 8–23)
CO2: 22 mmol/L (ref 22–32)
Calcium: 8.9 mg/dL (ref 8.9–10.3)
Chloride: 103 mmol/L (ref 98–111)
Creatinine, Ser: 0.73 mg/dL (ref 0.44–1.00)
GFR calc Af Amer: >60 mL/min (ref >60)
GFR calc non Af Amer: >60 mL/min (ref >60)
Glucose, Bld: 116 mg/dL — ABNORMAL HIGH (ref 70–99)
Potassium: 3.8 mmol/L (ref 3.5–5.1)
Sodium: 136 mmol/L (ref 135–145)

## 2019-11-24 LAB — MAGNESIUM: Magnesium: 1.8 mg/dL (ref 1.7–2.4)

## 2019-11-24 LAB — PREPARE RBC (CROSSMATCH)

## 2019-11-24 MED ORDER — SODIUM CHLORIDE 0.9% IV SOLUTION: Freq: Once | INTRAVENOUS | Status: DC

## 2019-11-24 MED ORDER — GABAPENTIN 300 MG PO CAPS
600.0000 mg | ORAL_CAPSULE | Freq: Once | ORAL | Status: AC
  Administered 2019-11-24: 600 mg via ORAL
  Filled 2019-11-24: qty 2

## 2019-11-24 NOTE — Progress Notes (Signed)
Monica [name] is a 86 year old female status post fall and bleeding a scalp hematoma on a patient that was anticoagulated with high INR.  She was given blood transfusion vitamin K as well as FFP and her coagulation resolved.  He did she did have a blush on the CTA that I have personally reviewed and she underwent embolization of a right occipital artery by Dr. Delana Meyer yesterday.  Vital signs have been stable and there is no evidence of expanding hematoma.  She is doing well.  Her hemoglobin is 7 and has been is stable as compared to yesterday. On exam today she is in no acute distress.  Her vital signs are stable she is mentating well.  There is evidence of an occipital nonexpanding hematoma that extends to the posterior parietal area.  No evidence of infection or active bleeding.  Plan: No need for surgical intervention. May follow-up as an outpatient with Dr. Hampton Abbot. We will be available if needed. Please note I spent at least 25 minutes in this encounter with greater than 50% spent in coordination and counseling of her care

## 2019-11-24 NOTE — Care Management (Signed)
Patient is from independent living at [facility] apartments. Please determine if PT evaluation is needed.

## 2019-11-24 NOTE — Progress Notes (Signed)
PROGRESS NOTE                                                                                                                                                                                                             Patient Demographics:    Monica [name], is a 86 y.o. female, DOB - 1926-05-16, [id number]  Admit date - 11/22/2019   Admitting Physician Louellen Molder, MD  Outpatient Primary MD for the patient is [name], NP  LOS - 1  Outpatient Specialists: None  Chief Complaint  Patient presents with  . Head Injury       Brief Narrative 86 year old female with chronic A. fib on Coumadin, non-insulin-dependent diabetes mellitus, hypertension who had a fall 1 month back and developed an occipital hematoma presents to the ED for the third time with bleeding from the hematoma.  Denies further falls at home, headache or blurred vision.  Uses walker to ambulate. In the ED vitals were stable, hemoglobin 6.6 with almost 3 g drop, supratherapeutic INR of 3.3.  Hemostasis achieved in the ED with ligation.  Vitamin K given to reverse INR and 1 unit PRBC transfused.  Vascular surgery consulted and patient admitted to hospital service.   Subjective:   Patient frustrated this morning in regards to beeping IV fluids and not being given the right information about her condition overnight.   Assessment  & Plan :    Principal Problem:   Acute blood loss anemia Secondary to recurrent bleeding scalp hematoma with supratherapeutic INR. 2 unit PRBC transfused and INR reversed with vitamin K.  Still hemoglobin is 7 and ordered another unit of blood.  Monitor H&H.  Bleeding right occipital scalp hematoma, recurrent Recurrent bleeding with blood loss anemia.  CT angiogram without intracranial arterial occlusion or stenosis with parietal scalp hematoma with arterial contrast blush arising from small branch of the right occipital  artery.   Underwent embolization of the right occipital artery by vascular surgery.  (Arch aortogram with coil embolization of the distal right occipital artery).  Tolerated well.     Active Problems:    Atrial fibrillation, chronic (HCC) On Coumadin with supratherapeutic INR.  Reversed with vitamin K.  Given history of fall will have PT evaluate patient and if concern for high fall risk may need to discontinue anticoagulation altogether.  If patient is  at low fall risk I will switch her Coumadin to Eliquis.    Non-insulin dependent type 2 diabetes mellitus (HCC) Stable on sliding scale coverage   Essential hypertension Stable.   Code Status : Full code  Family Communication  : Called daughter and left a message yesterday.  Will call her again today to update  Disposition Plan  : Home possibly tomorrow if no concern for further bleeding, pending PT evaluation.  Barriers For Discharge : Active symptoms  Consults  : Vascular surgery/IR  Procedures  : CTA  DVT Prophylaxis  : Blood loss anemia  Lab Results  Component Value Date   PLT 379 11/24/2019    Antibiotics  :    Anti-infectives (From admission, onward)   Start     Dose/Rate Route Frequency Ordered Stop   11/24/19 0000  clindamycin (CLEOCIN) IVPB 300 mg    Note to Pharmacy: To be given in specials   300 mg 100 mL/hr over 30 Minutes Intravenous  Once 11/23/19 1108 11/23/19 1539        Objective:   Vitals:   11/24/19 0608 11/24/19 0815 11/24/19 1230 11/24/19 1249  BP: (!) 113/56 (!) 132/45 (!) 115/59 (!) 137/50  Pulse: (!) 102 91 93 93  Resp:  16 18 18   Temp: 99.6 F (37.6 C) 98.8 F (37.1 C) 99.6 F (37.6 C) 98.4 F (36.9 C)  TempSrc: Oral Oral Oral   SpO2: 99% 96% 98% 99%  Weight:      Height:        Wt Readings from Last 3 Encounters:  11/23/19 66.7 kg  11/14/19 68 kg  11/06/19 68 kg     Intake/Output Summary (Last 24 hours) at 11/24/2019 1408 Last data filed at 11/24/2019 1234 Gross per 24  hour  Intake 1000.22 ml  Output 400 ml  Net 600.22 ml   Physical exam Elderly female, irritable, not in distress HEENT: Pallor present, moist mucosa, supple neck, clean stitches over the right occipital scalp Chest: Clear CVs: Normal S1-S2 GI: Soft, nondistended, nontender Musculoskeletal: Warm, no edema     Data Review:    CBC Recent Labs  Lab 11/22/19 1902 11/22/19 2129 11/23/19 0501 11/24/19 0531  WBC 8.8  --  8.6 8.2  HGB 7.4* 6.6* 7.3* 7.0*  HCT 24.0* 21.8* 23.3* 22.3*  PLT 420*  --  356 379  MCV 76.7*  --  77.7* 77.7*  MCH 23.6*  --  24.3* 24.4*  MCHC 30.8  --  31.3 31.4  RDW 15.3  --  15.9* 16.0*    Chemistries  Recent Labs  Lab 11/22/19 1902 11/23/19 1827 11/24/19 0531  NA 135 133* 136  K 4.2 4.2 3.8  CL 98 103 103  CO2 25 22 22   GLUCOSE 121* 158* 116*  BUN 16 10 10   CREATININE 0.86 0.78 0.73  CALCIUM 9.8 9.0 8.9  MG  --   --  1.8   ------------------------------------------------------------------------------------------------------------------ No results for input(s): CHOL, HDL, LDLCALC, TRIG, CHOLHDL, LDLDIRECT in the last 72 hours.  Lab Results  Component Value Date   HGBA1C 6.1 (H) 11/22/2019   ------------------------------------------------------------------------------------------------------------------ No results for input(s): TSH, T4TOTAL, T3FREE, THYROIDAB in the last 72 hours.  Invalid input(s): FREET3 ------------------------------------------------------------------------------------------------------------------ No results for input(s): VITAMINB12, FOLATE, FERRITIN, TIBC, IRON, RETICCTPCT in the last 72 hours.  Coagulation profile Recent Labs  Lab 11/22/19 1902 11/23/19 0501 11/24/19 0531  INR 3.3* 2.1* 1.3*    No results for input(s): DDIMER in the last 72 hours.  Cardiac  Enzymes No results for input(s): CKMB, TROPONINI, MYOGLOBIN in the last 168 hours.  Invalid input(s):  CK ------------------------------------------------------------------------------------------------------------------    Component Value Date/Time   BNP 311 03/27/2014 2029    Inpatient Medications  Scheduled Meds: . sodium chloride   Intravenous Once  . sodium chloride   Intravenous Once  . insulin aspart  0-15 Units Subcutaneous TID WC   Continuous Infusions: . sodium chloride    . sodium chloride 75 mL/hr at 11/24/19 0152   PRN Meds:.acetaminophen, HYDROmorphone (DILAUDID) injection, ondansetron **OR** ondansetron (ZOFRAN) IV, ondansetron (ZOFRAN) IV  Micro Results Recent Results (from the past 240 hour(s))  SARS CORONAVIRUS 2 (TAT 6-24 HRS) Nasopharyngeal Nasopharyngeal Swab     Status: None   Collection Time: 11/22/19 10:28 PM   Specimen: Nasopharyngeal Swab  Result Value Ref Range Status   SARS Coronavirus 2 NEGATIVE NEGATIVE Final    Comment: (NOTE) SARS-CoV-2 target nucleic acids are NOT DETECTED. The SARS-CoV-2 RNA is generally detectable in upper and lower respiratory specimens during the acute phase of infection. Negative results do not preclude SARS-CoV-2 infection, do not rule out co-infections with other pathogens, and should not be used as the sole basis for treatment or other patient management decisions. Negative results must be combined with clinical observations, patient history, and epidemiological information. The expected result is Negative. Fact Sheet for Patients: SugarRoll.be Fact Sheet for Healthcare Providers: https://www.woods-mathews.com/ This test is not yet approved or cleared by the [place] FDA and  has been authorized for detection and/or diagnosis of SARS-CoV-2 by FDA under an Emergency Use Authorization (EUA). This EUA will remain  in effect (meaning this test can be used) for the duration of the COVID-19 declaration under Section 56 4(b)(1) of the Act, 21 U.S.C. section 360bbb-3(b)(1),  unless the authorization is terminated or revoked sooner. Performed at [facility], [place] [address], [place], [place] [address]   Respiratory Panel by RT PCR (Flu A&B, Covid) - Nasopharyngeal Swab     Status: None   Collection Time: 11/23/19  8:51 AM   Specimen: Nasopharyngeal Swab  Result Value Ref Range Status   SARS Coronavirus 2 by RT PCR NEGATIVE NEGATIVE Final    Comment: (NOTE) SARS-CoV-2 target nucleic acids are NOT DETECTED. The SARS-CoV-2 RNA is generally detectable in upper respiratoy specimens during the acute phase of infection. The lowest concentration of SARS-CoV-2 viral copies this assay can detect is 131 copies/mL. A negative result does not preclude SARS-Cov-2 infection and should not be used as the sole basis for treatment or other patient management decisions. A negative result may occur with  improper specimen collection/handling, submission of specimen other than nasopharyngeal swab, presence of viral mutation(s) within the areas targeted by this assay, and inadequate number of viral copies (<131 copies/mL). A negative result must be combined with clinical observations, patient history, and epidemiological information. The expected result is Negative. Fact Sheet for Patients:  PinkCheek.be Fact Sheet for Healthcare Providers:  GravelBags.it This test is not yet ap proved or cleared by the [place] FDA and  has been authorized for detection and/or diagnosis of SARS-CoV-2 by FDA under an Emergency Use Authorization (EUA). This EUA will remain  in effect (meaning this test can be used) for the duration of the COVID-19 declaration under Section 564(b)(1) of the Act, 21 U.S.C. section 360bbb-3(b)(1), unless the authorization is terminated or revoked sooner.    Influenza A by PCR NEGATIVE NEGATIVE Final   Influenza B by PCR NEGATIVE NEGATIVE Final  Comment: (NOTE) The Xpert Xpress  SARS-CoV-2/FLU/RSV assay is intended as an aid in  the diagnosis of influenza from Nasopharyngeal swab specimens and  should not be used as a sole basis for treatment. Nasal washings and  aspirates are unacceptable for Xpert Xpress SARS-CoV-2/FLU/RSV  testing. Fact Sheet for Patients: PinkCheek.be Fact Sheet for Healthcare Providers: GravelBags.it This test is not yet approved or cleared by the [place] FDA and  has been authorized for detection and/or diagnosis of SARS-CoV-2 by  FDA under an Emergency Use Authorization (EUA). This EUA will remain  in effect (meaning this test can be used) for the duration of the  Covid-19 declaration under Section 564(b)(1) of the Act, 21  U.S.C. section 360bbb-3(b)(1), unless the authorization is  terminated or revoked. Performed at [facility], [address] [place], [place] [address]     Radiology Reports CT Angio Head W or [place] Contrast  Result Date: 11/22/2019 CLINICAL DATA:  Scalp hemorrhage EXAM: CT ANGIOGRAPHY HEAD TECHNIQUE: Multidetector CT imaging of the head was performed using the standard protocol during bolus administration of intravenous contrast. Multiplanar CT image reconstructions and MIPs were obtained to evaluate the vascular anatomy. CONTRAST:  56mL OMNIPAQUE IOHEXOL 350 MG/ML SOLN COMPARISON:  None. FINDINGS: CT HEAD Brain: There is no mass, hemorrhage or extra-axial collection. The size and configuration of the ventricles and extra-axial CSF spaces are normal. There is hypoattenuation of the white matter, most commonly indicating chronic small vessel disease. Vascular: No abnormal hyperdensity of the major intracranial arteries or dural venous sinuses. No intracranial atherosclerosis. Skull: Right parietal scalp hematoma.  No skull fracture. Sinuses/Orbits: No fluid levels or advanced mucosal thickening of the visualized paranasal sinuses. No mastoid or middle ear  effusion. The orbits are normal. CTA HEAD POSTERIOR CIRCULATION: --Vertebral arteries: Normal V4 segments. --Posterior inferior cerebellar arteries (PICA): Patent origins from the vertebral arteries. --Anterior inferior cerebellar arteries (AICA): Patent origins from the basilar artery. --Basilar artery: Normal. --Superior cerebellar arteries: Normal. --Posterior cerebral arteries: Normal. Both originate from the basilar artery. Posterior communicating arteries (p-comm) are diminutive or absent. ANTERIOR CIRCULATION: --Intracranial internal carotid arteries: Atherosclerotic calcification of the internal carotid arteries at the skull base without hemodynamically significant stenosis. --Anterior cerebral arteries (ACA): Normal. Both A1 segments are present. Patent anterior communicating artery (a-comm). --Middle cerebral arteries (MCA): Normal. External carotid artery branches: The right parietal scalp hematoma contains an arterial blush of contrast arising from a branch of the right occipital artery. Venous sinuses: Normal Anatomic variants: None IMPRESSION: 1. Right parietal scalp hematoma with arterial contrast blush arising from a small branch of the right occipital artery. 2. No intracranial arterial occlusion or high-grade stenosis. 3. No acute intracranial abnormality. Electronically Signed   By: Ulyses Jarred M.D.   On: 11/22/2019 23:17   PERIPHERAL VASCULAR CATHETERIZATION  Result Date: 11/23/2019 See op note   Time Spent in minutes 35    Johnnette Laux M.D on 11/24/2019 at 2:08 PM  Between 7am to 7pm - Pager - [phone]  After 7pm go to [email or web address] - password [email or web address]  [facility] -  Office  [phone]

## 2019-11-25 DIAGNOSIS — R791 Abnormal coagulation profile: Secondary | ICD-10-CM

## 2019-11-25 LAB — GLUCOSE, CAPILLARY
Glucose-Capillary: 139 mg/dL — ABNORMAL HIGH (ref 70–99)
Glucose-Capillary: 121 mg/dL — ABNORMAL HIGH (ref 70–99)
Glucose-Capillary: 147 mg/dL — ABNORMAL HIGH (ref 70–99)

## 2019-11-25 LAB — CBC
HCT: 28.2 % — ABNORMAL LOW (ref 36.0–46.0)
Hemoglobin: 9 g/dL — ABNORMAL LOW (ref 12.0–15.0)
MCH: 25.1 pg — ABNORMAL LOW (ref 26.0–34.0)
MCHC: 31.9 g/dL (ref 30.0–36.0)
MCV: 78.8 fL — ABNORMAL LOW (ref 80.0–100.0)
Platelets: 357 10*3/uL (ref 150–400)
RBC: 3.58 MIL/uL — ABNORMAL LOW (ref 3.87–5.11)
RDW: 16.1 % — ABNORMAL HIGH (ref 11.5–15.5)
WBC: 8.5 10*3/uL (ref 4.0–10.5)
nRBC: 0 % (ref 0.0–0.2)

## 2019-11-25 MED ORDER — APIXABAN 2.5 MG PO TABS: 2.5000 mg | ORAL_TABLET | Freq: Two times a day (BID) | ORAL | 2 refills | Status: DC

## 2019-11-25 MED ORDER — FUROSEMIDE 20 MG PO TABS: 20.0000 mg | ORAL_TABLET | Freq: Every day | ORAL | 0 refills | Status: DC | PRN

## 2019-11-25 NOTE — Evaluation (Signed)
Physical Therapy Evaluation Patient Details Name: Monica [name] MRN: [id number] DOB: 1926/02/18 Today's Date: 11/25/2019   History of Present Illness  Patient is a 86 year old female who was admitted with acute blood loss anemia. She has occipital hematoma that is bleeding from a fall sustained in [place]. She has come to ED 3x since for same. PMH includes: Afib, HTN, DM  Clinical Impression  Patient received in bed, agrees to PT assessment. She is hopeful that she can go home today. She performed bed mobility and transfers with supervision. Ambulated with hand held assist 150'. She normally uses a rollator, she did not want to use rolling walker and opted for hand held assist. She reports she feels steady, she does not have any dizziness. Appears to be at her baseline level of functioning. She does not require PT follow up upon discharge at this time.        Follow Up Recommendations No PT follow up    Equipment Recommendations  None recommended by PT    Recommendations for Other Services       Precautions / Restrictions Precautions Precautions: Fall Restrictions Weight Bearing Restrictions: No      Mobility  Bed Mobility Overal bed mobility: Independent                Transfers   Equipment used: 1 person hand held assist             General transfer comment: patient did not want to use RW, uses rollator and is afraid of standard RW  Ambulation/Gait Ambulation/Gait assistance: Min guard Gait Distance (Feet): 150 Feet Assistive device: 1 person hand held assist Gait Pattern/deviations: WFL(Within Functional Limits) Gait velocity: decreased   General Gait Details: patient steady, no LOB, slight fatigue with this distance  Stairs            Wheelchair Mobility    Modified Rankin (Stroke Patients Only)       Balance Overall balance assessment: Modified Independent                                           Pertinent  Vitals/Pain Pain Assessment: No/denies pain    Home Living Family/patient expects to be discharged to:: Assisted living Living Arrangements: Alone Available Help at Discharge: Personal care attendant Type of Home: [place]: One level Home Equipment: Walker - 4 wheels;Grab bars - toilet;Grab bars - tub/shower;Shower seat      Prior Function Level of Independence: Independent with assistive device(s)         Comments: Prior to covid pt was ambulating to dining hall for meals. Stated she she has assistance with groceries, cleaning.     Hand Dominance   Dominant Hand: Right    Extremity/Trunk Assessment   Upper Extremity Assessment Upper Extremity Assessment: Generalized weakness    Lower Extremity Assessment Lower Extremity Assessment: Generalized weakness    Cervical / Trunk Assessment Cervical / Trunk Assessment: Normal  Communication   Communication: No difficulties  Cognition Arousal/Alertness: Awake/alert Behavior During Therapy: WFL for tasks assessed/performed Overall Cognitive Status: Within Functional Limits for tasks assessed                                        General Comments  Exercises     Assessment/Plan    PT Assessment Patent does not need any further PT services  PT Problem List Decreased mobility       PT Treatment Interventions      PT Goals (Current goals can be found in the Care Plan section)  Acute Rehab PT Goals Patient Stated Goal: to return home today hopefully PT Goal Formulation: With patient Time For Goal Achievement: 11/28/19 Potential to Achieve Goals: Good    Frequency     Barriers to discharge        Co-evaluation               AM-PAC PT "6 Clicks" Mobility  Outcome Measure Help needed turning from your back to your side while in a flat bed without using bedrails?: None Help needed moving from lying on your back to sitting on the side of a flat bed without using  bedrails?: None Help needed moving to and from a bed to a chair (including a wheelchair)?: A Little Help needed standing up from a chair using your arms (e.g., wheelchair or bedside chair)?: A Little Help needed to walk in hospital room?: A Little Help needed climbing 3-5 steps with a railing? : A Little 6 Click Score: 20    End of Session Equipment Utilized During Treatment: Gait belt Activity Tolerance: Patient tolerated treatment well Patient left: in bed;with bed alarm set;with call bell/phone within reach Nurse Communication: Mobility status PT Visit Diagnosis: Other abnormalities of gait and mobility (R26.89);Muscle weakness (generalized) (M62.81)    Time: 1435-1500 PT Time Calculation (min) (ACUTE ONLY): 25 min   Charges:   PT Evaluation $PT Eval Moderate Complexity: 1 Mod PT Treatments $Gait Training: 8-22 mins        Izaia Say, PT, GCS 11/25/19,3:12 PM

## 2019-11-25 NOTE — Discharge Summary (Addendum)
Physician Discharge Summary  Monica [name] [id number] DOB: [birth year] DOA: 11/22/2019  PCP: [name], NP  Admit date: 11/22/2019 Discharge date: 11/25/2019  Admitted From: Independent living Disposition: Independent living  Recommendations for Outpatient Follow-up:  1. Follow up with PCP in 1-2 weeks.  Patient is being discharged on Eliquis.  Please check H&H as outpatient 2. Follow-up with general surgery and vascular surgery in 1-2 weeks.  Home Health: None Equipment/Devices: Has rolling walker and wheelchair at home  Discharge Condition: Fair CODE STATUS: Full code Diet recommendation: Carb modified    Discharge Diagnoses:  Principal Problem:   Acute blood loss anemia   Active Problems:   Essential hypertension   Atrial fibrillation, chronic (HCC)   Non-insulin dependent type 2 diabetes mellitus (HCC)   Hematoma of occipital surface of head   Supratherapeutic INR  Brief narrative/HPI 87 year old female with chronic A. fib on Coumadin, non-insulin-dependent diabetes mellitus, hypertension who had a fall 1 month back and developed an occipital hematoma presents to the ED for the third time with bleeding from the hematoma.  Denies further falls at home, headache or blurred vision.  Uses walker to ambulate. In the ED vitals were stable, hemoglobin 6.6 with almost 3 g drop, supratherapeutic INR of 3.3.  Hemostasis achieved in the ED with ligation.  Vitamin K given to reverse INR and 1 unit PRBC transfused.  Vascular surgery consulted and patient admitted to the hospitalist service.  Hospital course   Principal Problem:   Acute blood loss anemia Secondary to recurrent bleeding scalp hematoma with supratherapeutic INR.  Received vitamin K to reverse INR. Received total 3 unit PRBC and hemoglobin improved to >9.  Bleeding right occipital scalp hematoma, recurrent Recurrent bleeding with blood loss anemia.  CT angiogram without intracranial arterial occlusion or  stenosis with parietal scalp hematoma with arterial contrast blush arising from small branch of the right occipital artery.   Underwent embolization of the right occipital artery by vascular surgery.  (Arch aortogram with coil embolization of the distal right occipital artery).  Tolerated well.  No further bleeding noted.  Patient will be followed by vascular surgery and general surgery as outpatient.     Active Problems:    Atrial fibrillation, chronic (HCC) On Coumadin with supratherapeutic INR.  Reversed with vitamin K.   Seen by PT and appeared steady to ambulate with a walker without any dizziness or weakness.  I will switch her to low-dose Eliquis (2.5 mg twice daily). I have instructed the patient and her daughter that if she has issues with balance and has falls in future we will need to take her off anticoagulation completely.    Non-insulin dependent type 2 diabetes mellitus (Post) CBGs in low to mid 100s throughout hospital stay.  Patient is on glipizide and metformin at home.  Given her advanced age and risk of hypoglycemia I will discontinue glipizide.   Essential hypertension Remains stable.  Patient is on 4 different blood pressure medication (amlodipine-benazepril, HCTZ and Lasix).  Given her advanced age and risk of hypotension and orthostasis I will discontinue her HCTZ and ordered Lasix only as needed for leg edema.     Family Communication  :  Discussed in detail with daughter [name] on the phone.  Disposition Plan  : Home   Consults  : Vascular surgery, general surgery  Procedures  : CTA   Discharge Instructions   Allergies as of 11/25/2019      Reactions   Sulfa Antibiotics Swelling   Celecoxib  Nausea And Vomiting   Acetaminophen Itching   Codeine Rash   Lyrica [pregabalin] Rash   Penicillin G Rash   Has patient had a PCN reaction causing immediate rash, facial/tongue/throat swelling, SOB or lightheadedness with hypotension: Yes Has  patient had a PCN reaction causing severe rash involving mucus membranes or skin necrosis: No Has patient had a PCN reaction that required hospitalization: No Has patient had a PCN reaction occurring within the last 10 years: Unknown If all of the above answers are "NO", then may proceed with Cephalosporin use.   Petrolatum-zinc Oxide Rash      Medication List    STOP taking these medications   glipiZIDE 2.5 MG 24 hr tablet Commonly known as: GLUCOTROL XL   hydrochlorothiazide 25 MG tablet Commonly known as: HYDRODIURIL   levofloxacin 500 MG tablet Commonly known as: Levaquin   warfarin 4 MG tablet Commonly known as: COUMADIN     TAKE these medications   albuterol 108 (90 Base) MCG/ACT inhaler Commonly known as: VENTOLIN HFA Inhale 2 puffs into the lungs every 4 (four) hours as needed for wheezing or shortness of breath.   amLODipine-benazepril 5-20 MG capsule Commonly known as: LOTREL Take 1 capsule by mouth daily.   apixaban 2.5 MG Tabs tablet Commonly known as: Eliquis Take 1 tablet (2.5 mg total) by mouth 2 (two) times daily.   b complex vitamins capsule Take 1 capsule by mouth daily.   Breo Ellipta 100-25 MCG/INH Aepb Generic drug: fluticasone furoate-vilanterol TAKE 1 PUFF ONCE A DAY   desloratadine 5 MG tablet Commonly known as: CLARINEX Take 1 tablet (5 mg total) by mouth daily.   famotidine 20 MG tablet Commonly known as: PEPCID Take 1 tablet (20 mg total) by mouth 2 (two) times daily.   furosemide 20 MG tablet Commonly known as: LASIX Take 1 tablet (20 mg total) by mouth daily as needed for fluid or edema. What changed:   when to take this  reasons to take this   gabapentin 300 MG capsule Commonly known as: NEURONTIN Take 2 capsules (600 mg total) by mouth at bedtime.   Magnesium 200 MG Tabs Take 1 tablet (200 mg total) by mouth daily.   metFORMIN 500 MG 24 hr tablet Commonly known as: GLUCOPHAGE-XR Take 2 tablets (1,000 mg total) by  mouth daily.   montelukast 10 MG tablet Commonly known as: SINGULAIR Take 10 mg by mouth at bedtime.   nystatin cream Commonly known as: MYCOSTATIN Apply topically 2 (two) times daily.   OneTouch Ultra test strip Generic drug: glucose blood 1 each by Other route daily. Use as instructed   oxybutynin 10 MG 24 hr tablet Commonly known as: DITROPAN-XL Take 1 tablet (10 mg total) by mouth daily.   pravastatin 40 MG tablet Commonly known as: PRAVACHOL Take 1 tablet (40 mg total) by mouth daily.   SALONPAS PAIN RELIEF PATCH EX Apply topically.   sodium chloride 1 g tablet Take 1 tablet (1 g total) by mouth 3 (three) times daily with meals.   theophylline 100 MG 24 hr capsule Commonly known as: Theo-24 TAKE 1 CAPSULE BY MOUTH EVERY DAY   triamcinolone 0.025 % cream Commonly known as: KENALOG Apply 1 application topically 2 (two) times daily. Ok to mix with topical lotion and apply to all affected areas.      Follow-up Information    Schnier, Dolores Lory, MD Follow up in 2 week(s).   Specialties: Vascular Surgery, Cardiology, Radiology, Vascular Surgery Why: no studies  Contact  information: [place] [address] [phone]        Olean Ree, MD Follow up in 1 week(s).   Specialty: General Surgery Contact information: [address] [place] [address] [phone]        [name], NP. Schedule an appointment as soon as possible for a visit in 1 week(s).   Specialty: Family Medicine Contact information: [address] [place] [address] [phone]          Allergies  Allergen Reactions  . Sulfa Antibiotics Swelling  . Celecoxib Nausea And Vomiting  . Acetaminophen Itching  . Codeine Rash  . Lyrica [Pregabalin] Rash  . Penicillin G Rash    Has patient had a PCN reaction causing immediate rash, facial/tongue/throat swelling, SOB or lightheadedness with hypotension: Yes Has patient had a PCN  reaction causing severe rash involving mucus membranes or skin necrosis: No Has patient had a PCN reaction that required hospitalization: No Has patient had a PCN reaction occurring within the last 10 years: Unknown If all of the above answers are "NO", then may proceed with Cephalosporin use.  Marland Kitchen Petrolatum-Zinc Oxide Rash       Procedures/Studies: CT Angio Head W or Wo Contrast  Result Date: 11/22/2019 CLINICAL DATA:  Scalp hemorrhage EXAM: CT ANGIOGRAPHY HEAD TECHNIQUE: Multidetector CT imaging of the head was performed using the standard protocol during bolus administration of intravenous contrast. Multiplanar CT image reconstructions and MIPs were obtained to evaluate the vascular anatomy. CONTRAST:  83mL OMNIPAQUE IOHEXOL 350 MG/ML SOLN COMPARISON:  None. FINDINGS: CT HEAD Brain: There is no mass, hemorrhage or extra-axial collection. The size and configuration of the ventricles and extra-axial CSF spaces are normal. There is hypoattenuation of the white matter, most commonly indicating chronic small vessel disease. Vascular: No abnormal hyperdensity of the major intracranial arteries or dural venous sinuses. No intracranial atherosclerosis. Skull: Right parietal scalp hematoma.  No skull fracture. Sinuses/Orbits: No fluid levels or advanced mucosal thickening of the visualized paranasal sinuses. No mastoid or middle ear effusion. The orbits are normal. CTA HEAD POSTERIOR CIRCULATION: --Vertebral arteries: Normal V4 segments. --Posterior inferior cerebellar arteries (PICA): Patent origins from the vertebral arteries. --Anterior inferior cerebellar arteries (AICA): Patent origins from the basilar artery. --Basilar artery: Normal. --Superior cerebellar arteries: Normal. --Posterior cerebral arteries: Normal. Both originate from the basilar artery. Posterior communicating arteries (p-comm) are diminutive or absent. ANTERIOR CIRCULATION: --Intracranial internal carotid arteries: Atherosclerotic  calcification of the internal carotid arteries at the skull base without hemodynamically significant stenosis. --Anterior cerebral arteries (ACA): Normal. Both A1 segments are present. Patent anterior communicating artery (a-comm). --Middle cerebral arteries (MCA): Normal. External carotid artery branches: The right parietal scalp hematoma contains an arterial blush of contrast arising from a branch of the right occipital artery. Venous sinuses: Normal Anatomic variants: None IMPRESSION: 1. Right parietal scalp hematoma with arterial contrast blush arising from a small branch of the right occipital artery. 2. No intracranial arterial occlusion or high-grade stenosis. 3. No acute intracranial abnormality. Electronically Signed   By: Ulyses Jarred M.D.   On: 11/22/2019 23:17   PERIPHERAL VASCULAR CATHETERIZATION  Result Date: 11/23/2019 See op note     Subjective: Denies any scalp pain or bleeding.  Discharge Exam: Vitals:   11/25/19 0718 11/25/19 1432  BP: (!) 131/52 (!) 133/53  Pulse: 72 85  Resp: 18 18  Temp: 97.7 F (36.5 C) 98.9 F (37.2 C)  SpO2: 99% 96%   Vitals:   11/24/19 1947 11/25/19 0500  11/25/19 0718 11/25/19 1432  BP: (!) 150/54 138/75 (!) 131/52 (!) 133/53  Pulse: 91 79 72 85  Resp: 18 18 18 18   Temp: 98.1 F (36.7 C) 99.3 F (37.4 C) 97.7 F (36.5 C) 98.9 F (37.2 C)  TempSrc: Oral Oral Oral Oral  SpO2: 97% 97% 99% 96%  Weight:      Height:        General: Elderly pleasant female not in distress HEENT: Moist mucosa, supple neck, clean stitches over the occipital scalp, no bleeding Chest: Clear bilaterally CVs: Normal S1-S2 GI: Soft, nondistended, nontender Musculoskeletal: Warm, no edema   The results of significant diagnostics from this hospitalization (including imaging, microbiology, ancillary and laboratory) are listed below for reference.     Microbiology: Recent Results (from the past 240 hour(s))  SARS CORONAVIRUS 2 (TAT 6-24 HRS) Nasopharyngeal  Nasopharyngeal Swab     Status: None   Collection Time: 11/22/19 10:28 PM   Specimen: Nasopharyngeal Swab  Result Value Ref Range Status   SARS Coronavirus 2 NEGATIVE NEGATIVE Final    Comment: (NOTE) SARS-CoV-2 target nucleic acids are NOT DETECTED. The SARS-CoV-2 RNA is generally detectable in upper and lower respiratory specimens during the acute phase of infection. Negative results do not preclude SARS-CoV-2 infection, do not rule out co-infections with other pathogens, and should not be used as the sole basis for treatment or other patient management decisions. Negative results must be combined with clinical observations, patient history, and epidemiological information. The expected result is Negative. Fact Sheet for Patients: SugarRoll.be Fact Sheet for Healthcare Providers: https://www.woods-mathews.com/ This test is not yet approved or cleared by the [place] FDA and  has been authorized for detection and/or diagnosis of SARS-CoV-2 by FDA under an Emergency Use Authorization (EUA). This EUA will remain  in effect (meaning this test can be used) for the duration of the COVID-19 declaration under Section 56 4(b)(1) of the Act, 21 U.S.C. section 360bbb-3(b)(1), unless the authorization is terminated or revoked sooner. Performed at [facility], [place] [address] [place], [place] [address]   Respiratory Panel by RT PCR (Flu A&B, Covid) - Nasopharyngeal Swab     Status: None   Collection Time: 11/23/19  8:51 AM   Specimen: Nasopharyngeal Swab  Result Value Ref Range Status   SARS Coronavirus 2 by RT PCR NEGATIVE NEGATIVE Final    Comment: (NOTE) SARS-CoV-2 target nucleic acids are NOT DETECTED. The SARS-CoV-2 RNA is generally detectable in upper respiratoy specimens during the acute phase of infection. The lowest concentration of SARS-CoV-2 viral copies this assay can detect is 131 copies/mL. A negative result does not  preclude SARS-Cov-2 infection and should not be used as the sole basis for treatment or other patient management decisions. A negative result may occur with  improper specimen collection/handling, submission of specimen other than nasopharyngeal swab, presence of viral mutation(s) within the areas targeted by this assay, and inadequate number of viral copies (<131 copies/mL). A negative result must be combined with clinical observations, patient history, and epidemiological information. The expected result is Negative. Fact Sheet for Patients:  PinkCheek.be Fact Sheet for Healthcare Providers:  GravelBags.it This test is not yet ap proved or cleared by the [place] FDA and  has been authorized for detection and/or diagnosis of SARS-CoV-2 by FDA under an Emergency Use Authorization (EUA). This EUA will remain  in effect (meaning this test can be used) for the duration of the COVID-19 declaration under Section 564(b)(1) of the Act, 21 U.S.C. section  360bbb-3(b)(1), unless the authorization is terminated or revoked sooner.    Influenza A by PCR NEGATIVE NEGATIVE Final   Influenza B by PCR NEGATIVE NEGATIVE Final    Comment: (NOTE) The Xpert Xpress SARS-CoV-2/FLU/RSV assay is intended as an aid in  the diagnosis of influenza from Nasopharyngeal swab specimens and  should not be used as a sole basis for treatment. Nasal washings and  aspirates are unacceptable for Xpert Xpress SARS-CoV-2/FLU/RSV  testing. Fact Sheet for Patients: PinkCheek.be Fact Sheet for Healthcare Providers: GravelBags.it This test is not yet approved or cleared by the [place] FDA and  has been authorized for detection and/or diagnosis of SARS-CoV-2 by  FDA under an Emergency Use Authorization (EUA). This EUA will remain  in effect (meaning this test can be used) for the duration of the   Covid-19 declaration under Section 564(b)(1) of the Act, 21  U.S.C. section 360bbb-3(b)(1), unless the authorization is  terminated or revoked. Performed at [facility], [place] [place], [place] [address]      Labs: BNP (last 3 results) No results for input(s): BNP in the last 8760 hours. Basic Metabolic Panel: Recent Labs  Lab 11/22/19 1902 11/23/19 1827 11/24/19 0531  NA 135 133* 136  K 4.2 4.2 3.8  CL 98 103 103  CO2 25 22 22   GLUCOSE 121* 158* 116*  BUN 16 10 10   CREATININE 0.86 0.78 0.73  CALCIUM 9.8 9.0 8.9  MG  --   --  1.8   Liver Function Tests: No results for input(s): AST, ALT, ALKPHOS, BILITOT, PROT, ALBUMIN in the last 168 hours. No results for input(s): LIPASE, AMYLASE in the last 168 hours. No results for input(s): AMMONIA in the last 168 hours. CBC: Recent Labs  Lab 11/22/19 1902 11/22/19 2129 11/23/19 0501 11/24/19 0531 11/25/19 0657  WBC 8.8  --  8.6 8.2 8.5  HGB 7.4* 6.6* 7.3* 7.0* 9.0*  HCT 24.0* 21.8* 23.3* 22.3* 28.2*  MCV 76.7*  --  77.7* 77.7* 78.8*  PLT 420*  --  356 379 357   Cardiac Enzymes: No results for input(s): CKTOTAL, CKMB, CKMBINDEX, TROPONINI in the last 168 hours. BNP: Invalid input(s): POCBNP CBG: Recent Labs  Lab 11/24/19 1114 11/24/19 1618 11/24/19 2121 11/25/19 0715 11/25/19 1112  GLUCAP 158* 128* 100* 139* 121*   D-Dimer No results for input(s): DDIMER in the last 72 hours. Hgb A1c Recent Labs    11/22/19 2123  HGBA1C 6.1*   Lipid Profile No results for input(s): CHOL, HDL, LDLCALC, TRIG, CHOLHDL, LDLDIRECT in the last 72 hours. Thyroid function studies No results for input(s): TSH, T4TOTAL, T3FREE, THYROIDAB in the last 72 hours.  Invalid input(s): FREET3 Anemia work up No results for input(s): VITAMINB12, FOLATE, FERRITIN, TIBC, IRON, RETICCTPCT in the last 72 hours. Urinalysis    Component Value Date/Time   COLORURINE YELLOW (A) 06/08/2019 2110   APPEARANCEUR Cloudy (A)  08/21/2019 1350   LABSPEC 1.005 06/08/2019 2110   LABSPEC 1.012 03/04/2014 1535   PHURINE 5.0 06/08/2019 2110   GLUCOSEU Negative 08/21/2019 1350   GLUCOSEU 50 mg/dL 03/04/2014 1535   HGBUR NEGATIVE 06/08/2019 2110   BILIRUBINUR Negative 08/21/2019 1350   BILIRUBINUR Negative 03/04/2014 [place] 06/08/2019 2110   PROTEINUR Negative 08/21/2019 1350   PROTEINUR NEGATIVE 06/08/2019 2110   UROBILINOGEN 0.2 12/27/2017 1520   NITRITE Negative 08/21/2019 1350   NITRITE NEGATIVE 06/08/2019 2110   LEUKOCYTESUR 3+ (A) 08/21/2019 1350   LEUKOCYTESUR SMALL (A) 06/08/2019 2110  LEUKOCYTESUR 3+ 03/04/2014 1535   Sepsis Labs Invalid input(s): PROCALCITONIN,  WBC,  LACTICIDVEN Microbiology Recent Results (from the past 240 hour(s))  SARS CORONAVIRUS 2 (TAT 6-24 HRS) Nasopharyngeal Nasopharyngeal Swab     Status: None   Collection Time: 11/22/19 10:28 PM   Specimen: Nasopharyngeal Swab  Result Value Ref Range Status   SARS Coronavirus 2 NEGATIVE NEGATIVE Final    Comment: (NOTE) SARS-CoV-2 target nucleic acids are NOT DETECTED. The SARS-CoV-2 RNA is generally detectable in upper and lower respiratory specimens during the acute phase of infection. Negative results do not preclude SARS-CoV-2 infection, do not rule out co-infections with other pathogens, and should not be used as the sole basis for treatment or other patient management decisions. Negative results must be combined with clinical observations, patient history, and epidemiological information. The expected result is Negative. Fact Sheet for Patients: SugarRoll.be Fact Sheet for Healthcare Providers: https://www.woods-mathews.com/ This test is not yet approved or cleared by the [place] FDA and  has been authorized for detection and/or diagnosis of SARS-CoV-2 by FDA under an Emergency Use Authorization (EUA). This EUA will remain  in effect (meaning this test can be  used) for the duration of the COVID-19 declaration under Section 56 4(b)(1) of the Act, 21 U.S.C. section 360bbb-3(b)(1), unless the authorization is terminated or revoked sooner. Performed at [facility], [place] [address] [place], [place] [address]   Respiratory Panel by RT PCR (Flu A&B, Covid) - Nasopharyngeal Swab     Status: None   Collection Time: 11/23/19  8:51 AM   Specimen: Nasopharyngeal Swab  Result Value Ref Range Status   SARS Coronavirus 2 by RT PCR NEGATIVE NEGATIVE Final    Comment: (NOTE) SARS-CoV-2 target nucleic acids are NOT DETECTED. The SARS-CoV-2 RNA is generally detectable in upper respiratoy specimens during the acute phase of infection. The lowest concentration of SARS-CoV-2 viral copies this assay can detect is 131 copies/mL. A negative result does not preclude SARS-Cov-2 infection and should not be used as the sole basis for treatment or other patient management decisions. A negative result may occur with  improper specimen collection/handling, submission of specimen other than nasopharyngeal swab, presence of viral mutation(s) within the areas targeted by this assay, and inadequate number of viral copies (<131 copies/mL). A negative result must be combined with clinical observations, patient history, and epidemiological information. The expected result is Negative. Fact Sheet for Patients:  PinkCheek.be Fact Sheet for Healthcare Providers:  GravelBags.it This test is not yet ap proved or cleared by the [place] FDA and  has been authorized for detection and/or diagnosis of SARS-CoV-2 by FDA under an Emergency Use Authorization (EUA). This EUA will remain  in effect (meaning this test can be used) for the duration of the COVID-19 declaration under Section 564(b)(1) of the Act, 21 U.S.C. section 360bbb-3(b)(1), unless the authorization is terminated or revoked sooner.    Influenza A by  PCR NEGATIVE NEGATIVE Final   Influenza B by PCR NEGATIVE NEGATIVE Final    Comment: (NOTE) The Xpert Xpress SARS-CoV-2/FLU/RSV assay is intended as an aid in  the diagnosis of influenza from Nasopharyngeal swab specimens and  should not be used as a sole basis for treatment. Nasal washings and  aspirates are unacceptable for Xpert Xpress SARS-CoV-2/FLU/RSV  testing. Fact Sheet for Patients: PinkCheek.be Fact Sheet for Healthcare Providers: GravelBags.it This test is not yet approved or cleared by the [place] FDA and  has been authorized for detection and/or diagnosis of SARS-CoV-2 by  FDA under an Emergency Use Authorization (EUA). This EUA will remain  in effect (meaning this test can be used) for the duration of the  Covid-19 declaration under Section 564(b)(1) of the Act, 21  U.S.C. section 360bbb-3(b)(1), unless the authorization is  terminated or revoked. Performed at [facility], [address] [place], [place] [address]      Time coordinating discharge: 35 minutes  SIGNED:   Louellen Molder, MD  [facility] 11/25/2019, 4:13 PM Pager   If 7PM-7AM, please contact night-coverage [email or web address] Password [email or web address]

## 2019-11-25 NOTE — Discharge Instructions (Signed)
Apixaban oral tablets What is this medicine? APIXABAN (a PIX a ban) is an anticoagulant (blood thinner). It is used to lower the chance of stroke in people with a medical condition called atrial fibrillation. It is also used to treat or prevent blood clots in the lungs or in the veins. This medicine may be used for other purposes; ask your health care provider or pharmacist if you have questions. COMMON BRAND NAME(S): Eliquis What should I tell my health care provider before I take this medicine? They need to know if you have any of these conditions:  antiphospholipid antibody syndrome  bleeding disorders  bleeding in the brain  blood in your stools (black or tarry stools) or if you have blood in your vomit  history of blood clots  history of stomach bleeding  kidney disease  liver disease  mechanical heart valve  an unusual or allergic reaction to apixaban, other medicines, foods, dyes, or preservatives  pregnant or trying to get pregnant  breast-feeding How should I use this medicine? Take this medicine by mouth with a glass of water. Follow the directions on the prescription label. You can take it with or without food. If it upsets your stomach, take it with food. Take your medicine at regular intervals. Do not take it more often than directed. Do not stop taking except on your doctor's advice. Stopping this medicine may increase your risk of a blood clot. Be sure to refill your prescription before you run out of medicine. Talk to your pediatrician regarding the use of this medicine in children. Special care may be needed. Overdosage: If you think you have taken too much of this medicine contact a poison control center or emergency room at once. NOTE: This medicine is only for you. Do not share this medicine with others. What if I miss a dose? If you miss a dose, take it as soon as you can. If it is almost time for your next dose, take only that dose. Do not take double or  extra doses. What may interact with this medicine? This medicine may interact with the following:  aspirin and aspirin-like medicines  certain medicines for fungal infections like ketoconazole and itraconazole  certain medicines for seizures like carbamazepine and phenytoin  certain medicines that treat or prevent blood clots like warfarin, enoxaparin, and dalteparin  clarithromycin  NSAIDs, medicines for pain and inflammation, like ibuprofen or naproxen  rifampin  ritonavir  St. John's wort This list may not describe all possible interactions. Give your health care provider a list of all the medicines, herbs, non-prescription drugs, or dietary supplements you use. Also tell them if you smoke, drink alcohol, or use illegal drugs. Some items may interact with your medicine. What should I watch for while using this medicine? Visit your healthcare professional for regular checks on your progress. You may need blood work done while you are taking this medicine. Your condition will be monitored carefully while you are receiving this medicine. It is important not to miss any appointments. Avoid sports and activities that might cause injury while you are using this medicine. Severe falls or injuries can cause unseen bleeding. Be careful when using sharp tools or knives. Consider using an electric razor. Take special care brushing or flossing your teeth. Report any injuries, bruising, or red spots on the skin to your healthcare professional. If you are going to need surgery or other procedure, tell your healthcare professional that you are taking this medicine. Wear a medical ID bracelet   or chain. Carry a card that describes your disease and details of your medicine and dosage times. What side effects may I notice from receiving this medicine? Side effects that you should report to your doctor or health care professional as soon as possible:  allergic reactions like skin rash, itching or hives,  swelling of the face, lips, or tongue  signs and symptoms of bleeding such as bloody or black, tarry stools; red or dark-brown urine; spitting up blood or brown material that looks like coffee grounds; red spots on the skin; unusual bruising or bleeding from the eye, gums, or nose  signs and symptoms of a blood clot such as chest pain; shortness of breath; pain, swelling, or warmth in the leg  signs and symptoms of a stroke such as changes in vision; confusion; trouble speaking or understanding; severe headaches; sudden numbness or weakness of the face, arm or leg; trouble walking; dizziness; loss of coordination This list may not describe all possible side effects. Call your doctor for medical advice about side effects. You may report side effects to FDA at 1-800-FDA-1088. Where should I keep my medicine? Keep out of the reach of children. Store at room temperature between 20 and 25 degrees C (68 and 77 degrees F). Throw away any unused medicine after the expiration date. NOTE: This sheet is a summary. It may not cover all possible information. If you have questions about this medicine, talk to your doctor, pharmacist, or health care provider.  2020 Elsevier/Gold Standard (2018-07-12 17:39:34)  

## 2019-11-25 NOTE — Progress Notes (Signed)
DISCHARGE NOTE:  Pt given discharge instructions and Eliquis script. Pt verbalized understanding. Pt wheeled to car by staff, daughter providing transportation.

## 2019-11-26 ENCOUNTER — Telehealth: Payer: Self-pay

## 2019-11-26 ENCOUNTER — Encounter: Payer: Self-pay | Admitting: Cardiology

## 2019-11-26 DIAGNOSIS — S0003XD Contusion of scalp, subsequent encounter: Secondary | ICD-10-CM | POA: Diagnosis not present

## 2019-11-26 DIAGNOSIS — E1151 Type 2 diabetes mellitus with diabetic peripheral angiopathy without gangrene: Secondary | ICD-10-CM | POA: Diagnosis not present

## 2019-11-26 DIAGNOSIS — S0101XD Laceration without foreign body of scalp, subsequent encounter: Secondary | ICD-10-CM | POA: Diagnosis not present

## 2019-11-26 DIAGNOSIS — E1165 Type 2 diabetes mellitus with hyperglycemia: Secondary | ICD-10-CM | POA: Diagnosis not present

## 2019-11-26 DIAGNOSIS — E1140 Type 2 diabetes mellitus with diabetic neuropathy, unspecified: Secondary | ICD-10-CM | POA: Diagnosis not present

## 2019-11-26 DIAGNOSIS — I8290 Acute embolism and thrombosis of unspecified vein: Secondary | ICD-10-CM | POA: Diagnosis not present

## 2019-11-26 LAB — TYPE AND SCREEN
ABO/RH(D): B POS
Antibody Screen: NEGATIVE
Unit division: 0
Unit division: 0
Unit division: 0

## 2019-11-26 LAB — BPAM RBC
Blood Product Expiration Date: 202102032359
Blood Product Expiration Date: 202102032359
Blood Product Expiration Date: 202102102359
ISSUE DATE / TIME: 202101080036
ISSUE DATE / TIME: 202101091226
Unit Type and Rh: 7300
Unit Type and Rh: 7300
Unit Type and Rh: 7300

## 2019-11-26 NOTE — Telephone Encounter (Signed)
Confirmed appointment with patient. klh °

## 2019-11-27 ENCOUNTER — Other Ambulatory Visit: Payer: Self-pay | Admitting: Internal Medicine

## 2019-11-27 ENCOUNTER — Telehealth: Payer: Self-pay

## 2019-11-27 ENCOUNTER — Ambulatory Visit: Payer: BLUE CROSS/BLUE SHIELD | Admitting: Nurse Practitioner

## 2019-11-27 LAB — PREPARE RBC (CROSSMATCH)

## 2019-11-27 MED ORDER — APIXABAN 2.5 MG PO TABS: 2.5000 mg | ORAL_TABLET | Freq: Two times a day (BID) | ORAL | 2 refills | Status: DC

## 2019-11-27 NOTE — Telephone Encounter (Signed)
I sent a new RX for it

## 2019-11-27 NOTE — Telephone Encounter (Signed)
Eliquis and Apixaban ( generic is the same, she can take one or the other

## 2019-11-28 ENCOUNTER — Other Ambulatory Visit: Payer: Self-pay

## 2019-11-28 ENCOUNTER — Ambulatory Visit (INDEPENDENT_AMBULATORY_CARE_PROVIDER_SITE_OTHER): Payer: Medicare Other | Admitting: Adult Health

## 2019-11-28 ENCOUNTER — Encounter: Payer: Self-pay | Admitting: Adult Health

## 2019-11-28 VITALS — BP 152/52 | HR 94 | Temp 97.2°F | Resp 16 | Ht 64.0 in | Wt 148.4 lb

## 2019-11-28 DIAGNOSIS — Z79899 Other long term (current) drug therapy: Secondary | ICD-10-CM

## 2019-11-28 DIAGNOSIS — I10 Essential (primary) hypertension: Secondary | ICD-10-CM | POA: Diagnosis not present

## 2019-11-28 DIAGNOSIS — Z09 Encounter for follow-up examination after completed treatment for conditions other than malignant neoplasm: Secondary | ICD-10-CM | POA: Diagnosis not present

## 2019-11-28 DIAGNOSIS — S0003XD Contusion of scalp, subsequent encounter: Secondary | ICD-10-CM

## 2019-11-28 DIAGNOSIS — Z7901 Long term (current) use of anticoagulants: Secondary | ICD-10-CM | POA: Diagnosis not present

## 2019-11-28 MED ORDER — APIXABAN 2.5 MG PO TABS: 2.5000 mg | ORAL_TABLET | Freq: Two times a day (BID) | ORAL | 2 refills | Status: DC

## 2019-11-28 NOTE — Progress Notes (Signed)
[facility] [place], [place] [address]  Internal MEDICINE  Office Visit Note  Patient Name: Monica [name]  [id number]  [id number]  Date of Service: 11/28/2019     Chief Complaint  Patient presents with  . Hospitalization Follow-up  . Hypertension  . Hyperlipidemia  . Medication Refill    pt has been out of eliquis since sunday   . Hand Problem    hand and arms cramp     HPI Pt is here for recent hospital follow up. She had a fall at home the beginning of December and hit the back of her head. She fell backwards and hit the back of her head of the right side, she was on Coumadin which caused a large hematoma to develop. She visited the ED multiple times the month of December and early January and was sent home each time after they were able to stop the bleeding with manual pressure. On her fourth visit to the ED she was seen by general surgery and vascular surgery. They determined that she had a small hole in the vessel which was causing the recurrent bleeds. Dr. Delana Meyer with vascular surgery embolized the artery, inserted through right groin. Sutures were placed to the scalp by general surgery Dr. Dahlia Byes. Her hospital course was further complicated by requiring blood transfusion due to low hemoglobin as well as reversal agent needed, Vitamin K, due to INR levels. Discharged hemoglobin level was 9.0, there has been no further bleeding since discharge. Discharge medications were changed, stopped coumadin and started 2.5 Eliquis BID, stopped glipizide and stopped HCTZ. Denies chest pain, headaches or dizziness at this time.   Current Medication: Outpatient Encounter Medications as of 11/28/2019  Medication Sig  . albuterol (VENTOLIN HFA) 108 (90 Base) MCG/ACT inhaler Inhale 2 puffs into the lungs every 4 (four) hours as needed for wheezing or shortness of breath.  Marland Kitchen amLODipine-benazepril (LOTREL) 5-20 MG capsule Take 1 capsule by mouth daily.   Marland Kitchen apixaban  (ELIQUIS) 2.5 MG TABS tablet Take 1 tablet (2.5 mg total) by mouth 2 (two) times daily.  Marland Kitchen b complex vitamins capsule Take 1 capsule by mouth daily.  Marland Kitchen desloratadine (CLARINEX) 5 MG tablet Take 1 tablet (5 mg total) by mouth daily.  . famotidine (PEPCID) 20 MG tablet Take 1 tablet (20 mg total) by mouth 2 (two) times daily.  . fluticasone furoate-vilanterol (BREO ELLIPTA) 100-25 MCG/INH AEPB TAKE 1 PUFF ONCE A DAY  . furosemide (LASIX) 20 MG tablet Take 1 tablet (20 mg total) by mouth daily as needed for fluid or edema.  . gabapentin (NEURONTIN) 300 MG capsule Take 2 capsules (600 mg total) by mouth at bedtime.  Marland Kitchen glucose blood (ONETOUCH ULTRA) test strip 1 each by Other route daily. Use as instructed  . Liniments (SALONPAS PAIN RELIEF PATCH EX) Apply topically.  . Magnesium 200 MG TABS Take 1 tablet (200 mg total) by mouth daily.  . metFORMIN (GLUCOPHAGE-XR) 500 MG 24 hr tablet Take 2 tablets (1,000 mg total) by mouth daily.  . montelukast (SINGULAIR) 10 MG tablet Take 10 mg by mouth at bedtime.  Marland Kitchen nystatin cream (MYCOSTATIN) Apply topically 2 (two) times daily.  Marland Kitchen oxybutynin (DITROPAN-XL) 10 MG 24 hr tablet Take 1 tablet (10 mg total) by mouth daily.  . pravastatin (PRAVACHOL) 40 MG tablet Take 1 tablet (40 mg total) by mouth daily.  . sodium chloride 1 g tablet Take 1 tablet (1 g total) by mouth 3 (three) times daily with  meals.  . theophylline (THEO-24) 100 MG 24 hr capsule TAKE 1 CAPSULE BY MOUTH EVERY DAY  . triamcinolone (KENALOG) 0.025 % cream Apply 1 application topically 2 (two) times daily. Ok to mix with topical lotion and apply to all affected areas.   No facility-administered encounter medications on file as of 11/28/2019.    Surgical History: Past Surgical History:  Procedure Laterality Date  . ABDOMINAL HYSTERECTOMY  1975  . BLADDER SUSPENSION  1989  . CAROTID ANGIOGRAPHY Right 11/23/2019   Procedure: CAROTID ANGIOGRAPHY;  Surgeon: Katha Cabal, MD;  Location: [facility];  Service: Cardiovascular;  Laterality: Right;  . COLONOSCOPY    . COLONOSCOPY WITH PROPOFOL N/A 01/25/2018   Procedure: COLONOSCOPY WITH PROPOFOL;  Surgeon: [name], MD;  Location: [facility];  Service: Endoscopy;  Laterality: N/A;  . ESOPHAGOGASTRODUODENOSCOPY (EGD) WITH PROPOFOL N/A 01/24/2018   Procedure: ESOPHAGOGASTRODUODENOSCOPY (EGD) WITH PROPOFOL;  Surgeon: [name], MD;  Location: [facility] ENDOSCOPY;  Service: Endoscopy;  Laterality: N/A;  . HIP ARTHROPLASTY Left 06/03/2019   Procedure: ARTHROPLASTY BIPOLAR HIP (HEMIARTHROPLASTY);  Surgeon: Dereck Leep, MD;  Location: [facility];  Service: Orthopedics;  Laterality: Left;    Medical History: Past Medical History:  Diagnosis Date  . Atrial fibrillation ([place])   . Bronchitis   . Cancer ([place])    Left leg growth, kidneys, lungs and breasts  . Carcinoma of unknown primary ([place])   . COPD (chronic obstructive pulmonary disease) ([place])   . Diabetes mellitus without complication ([place])   . Hyperlipidemia   . Hypertension   . Pancreatitis   . Pneumonia   . Stroke ([place])    TIA's  . Vitamin B12 deficiency     Family History: Family History  Problem Relation Age of Onset  . Cancer Father   . Diabetes Brother     Social History   Socioeconomic History  . Marital status: Divorced    Spouse name: Not on file  . Number of children: Not on file  . Years of education: Not on file  . Highest education level: Not on file  Occupational History  . Not on file  Tobacco Use  . Smoking status: Former Smoker    Packs/day: 0.50    Years: 15.00    Pack years: 7.50    Types: Cigarettes    Quit date: 11/15/1988    Years since quitting: 31.0  . Smokeless tobacco: Never Used  Substance and Sexual Activity  . Alcohol use: No    Alcohol/week: 0.0 standard drinks  . Drug use: No  . Sexual activity: Not on file    Comment: hysterectomy   Other Topics Concern  . Not on file  Social History Narrative  . Not on  file   Social Determinants of Health   Financial Resource Strain:   . Difficulty of Paying Living Expenses: Not on file  Food Insecurity:   . Worried About Charity fundraiser in the Last Year: Not on file  . Ran Out of Food in the Last Year: Not on file  Transportation Needs:   . Lack of Transportation (Medical): Not on file  . Lack of Transportation (Non-Medical): Not on file  Physical Activity:   . Days of Exercise per Week: Not on file  . Minutes of Exercise per Session: Not on file  Stress:   . Feeling of Stress : Not on file  Social Connections:   . Frequency of Communication with Friends and Family: Not on file  .  Frequency of Social Gatherings with Friends and Family: Not on file  . Attends Religious Services: Not on file  . Active Member of Clubs or Organizations: Not on file  . Attends Archivist Meetings: Not on file  . Marital Status: Not on file  Intimate Partner Violence:   . Fear of Current or Ex-Partner: Not on file  . Emotionally Abused: Not on file  . Physically Abused: Not on file  . Sexually Abused: Not on file      Review of Systems  Constitutional: Negative for chills, fatigue and unexpected weight change.  HENT: Negative for congestion, rhinorrhea, sneezing and sore throat.   Eyes: Negative for photophobia, pain and redness.  Respiratory: Negative for cough, chest tightness and shortness of breath.   Cardiovascular: Negative for chest pain and palpitations.  Gastrointestinal: Negative for abdominal pain, constipation, diarrhea, nausea and vomiting.  Endocrine: Negative.   Genitourinary: Negative for dysuria and frequency.  Musculoskeletal: Negative for arthralgias, back pain, joint swelling and neck pain.  Skin: Negative for rash.  Allergic/Immunologic: Negative.   Neurological: Negative for tremors and numbness.  Hematological: Negative for adenopathy. Bruises/bleeds easily.  Psychiatric/Behavioral: Negative for behavioral problems and  sleep disturbance. The patient is not nervous/anxious.     Vital Signs: BP (!) 152/52   Pulse 94   Temp (!) 97.2 F (36.2 C)   Resp 16   Ht 5\' 4"  (1.626 m)   Wt 148 lb 6.4 oz (67.3 kg)   SpO2 100%   BMI 25.47 kg/m    Physical Exam Vitals and nursing note reviewed.  Constitutional:      General: She is not in acute distress.    Appearance: She is well-developed. She is not diaphoretic.  HENT:     Head: Normocephalic and atraumatic.     Mouth/Throat:     Pharynx: No oropharyngeal exudate.  Eyes:     Pupils: Pupils are equal, round, and reactive to light.  Neck:     Thyroid: No thyromegaly.     Vascular: No JVD.     Trachea: No tracheal deviation.  Cardiovascular:     Rate and Rhythm: Normal rate and regular rhythm.     Heart sounds: Normal heart sounds. No murmur. No friction rub. No gallop.   Pulmonary:     Effort: Pulmonary effort is normal. No respiratory distress.     Breath sounds: Normal breath sounds. No wheezing or rales.  Chest:     Chest wall: No tenderness.  Abdominal:     Palpations: Abdomen is soft.     Tenderness: There is no abdominal tenderness. There is no guarding.  Musculoskeletal:        General: Normal range of motion.     Cervical back: Normal range of motion and neck supple.     Right lower leg: Edema present.     Left lower leg: Edema present.  Lymphadenopathy:     Cervical: No cervical adenopathy.  Skin:    General: Skin is warm and dry.          Comments: Small hematoma still present, larger area of dried blood, sutures in place  Neurological:     Mental Status: She is alert and oriented to person, place, and time.     Cranial Nerves: No cranial nerve deficit.  Psychiatric:        Behavior: Behavior normal.        Thought Content: Thought content normal.        Judgment: Judgment  normal.     Assessment/Plan: 1. Hospital discharge follow-up Multiple visits to ED for back of head right sided hematoma that developed from fall at  home, recurrent episodes of bleeding. Was recently admitted for same symptoms and discharged 11/25/19. Vessel required embolization from vascular surgery and suture placement by general surgery.  2. Long term current use of anticoagulant therapy Has been on Coumadin for many years for history of DVT and A. Fib. Due to recent events, coumadin was discontinued and changed to Eliquis to reduce the risk of bleeding. After extensive discussions with patient about weighing the risk vs benefit of anticoagulation therapy she would like to continue with Eliquis at this time. - apixaban (ELIQUIS) 2.5 MG TABS tablet; Take 1 tablet (2.5 mg total) by mouth 2 (two) times daily.  Dispense: 60 tablet; Refill: 2  3. Hematoma of scalp, subsequent encounter Sutures placed 11/24/19 and to stay in place for 10 days, will follow-up with Dr. Arley Phenix with general surgery to have sutures removed. No further bleeding since discharge and per patient report hematoma has greatly decreased in size.   4. Essential hypertension Stable on current therapy, HCTZ discontinued at hospital discharge. Was started on Lasix PRN for fluid build up in legs, patient has not taken any since discharge.   5. Medication therapy changed Patient requested we review her medications and discontinue those we feel are no longer indicated. Statin therapy discontinued as guidelines recommend at her age therapy is no longer needed. Also stopped famotidine as it has been many years since patient had a gastric ulcer and does not suffer from GERD.   General Counseling: Jamayia verbalizes understanding of the findings of todays visit and agrees with plan of treatment. I have discussed any further diagnostic evaluation that may be needed or ordered today. We also reviewed her medications today. she has been encouraged to call the office with any questions or concerns that should arise related to todays visit.    No orders of the defined types were placed in this  encounter.    I have reviewed all medical records from hospital follow up including radiology reports and consults from other physicians. Appropriate follow up diagnostics will be scheduled as needed. Patient/ Family understands the plan of treatment. Time spent 30 minutes.   Orson Gear AGNP-C Internal Medicine

## 2019-11-30 DIAGNOSIS — S0101XD Laceration without foreign body of scalp, subsequent encounter: Secondary | ICD-10-CM | POA: Diagnosis not present

## 2019-11-30 DIAGNOSIS — E1165 Type 2 diabetes mellitus with hyperglycemia: Secondary | ICD-10-CM | POA: Diagnosis not present

## 2019-11-30 DIAGNOSIS — E1140 Type 2 diabetes mellitus with diabetic neuropathy, unspecified: Secondary | ICD-10-CM | POA: Diagnosis not present

## 2019-11-30 DIAGNOSIS — S0003XD Contusion of scalp, subsequent encounter: Secondary | ICD-10-CM | POA: Diagnosis not present

## 2019-11-30 DIAGNOSIS — E1151 Type 2 diabetes mellitus with diabetic peripheral angiopathy without gangrene: Secondary | ICD-10-CM | POA: Diagnosis not present

## 2019-11-30 DIAGNOSIS — I8290 Acute embolism and thrombosis of unspecified vein: Secondary | ICD-10-CM | POA: Diagnosis not present

## 2019-12-06 ENCOUNTER — Telehealth (INDEPENDENT_AMBULATORY_CARE_PROVIDER_SITE_OTHER): Payer: Self-pay | Admitting: Vascular Surgery

## 2019-12-10 ENCOUNTER — Ambulatory Visit (INDEPENDENT_AMBULATORY_CARE_PROVIDER_SITE_OTHER): Payer: Medicare Other | Admitting: Vascular Surgery

## 2019-12-11 ENCOUNTER — Ambulatory Visit: Payer: Medicare Other | Admitting: Adult Health

## 2019-12-11 DIAGNOSIS — E1165 Type 2 diabetes mellitus with hyperglycemia: Secondary | ICD-10-CM | POA: Diagnosis not present

## 2019-12-11 DIAGNOSIS — S0101XD Laceration without foreign body of scalp, subsequent encounter: Secondary | ICD-10-CM | POA: Diagnosis not present

## 2019-12-11 DIAGNOSIS — I8290 Acute embolism and thrombosis of unspecified vein: Secondary | ICD-10-CM | POA: Diagnosis not present

## 2019-12-11 DIAGNOSIS — S0003XD Contusion of scalp, subsequent encounter: Secondary | ICD-10-CM | POA: Diagnosis not present

## 2019-12-11 DIAGNOSIS — E1140 Type 2 diabetes mellitus with diabetic neuropathy, unspecified: Secondary | ICD-10-CM | POA: Diagnosis not present

## 2019-12-11 DIAGNOSIS — E1151 Type 2 diabetes mellitus with diabetic peripheral angiopathy without gangrene: Secondary | ICD-10-CM | POA: Diagnosis not present

## 2019-12-13 ENCOUNTER — Ambulatory Visit (INDEPENDENT_AMBULATORY_CARE_PROVIDER_SITE_OTHER): Payer: Medicare Other | Admitting: Vascular Surgery

## 2019-12-13 ENCOUNTER — Encounter (INDEPENDENT_AMBULATORY_CARE_PROVIDER_SITE_OTHER): Payer: Self-pay | Admitting: Vascular Surgery

## 2019-12-13 ENCOUNTER — Other Ambulatory Visit: Payer: Self-pay

## 2019-12-13 VITALS — BP 153/76 | HR 112 | Resp 20

## 2019-12-13 DIAGNOSIS — E119 Type 2 diabetes mellitus without complications: Secondary | ICD-10-CM | POA: Diagnosis not present

## 2019-12-13 DIAGNOSIS — E7800 Pure hypercholesterolemia, unspecified: Secondary | ICD-10-CM | POA: Diagnosis not present

## 2019-12-13 DIAGNOSIS — S0003XD Contusion of scalp, subsequent encounter: Secondary | ICD-10-CM | POA: Diagnosis not present

## 2019-12-13 DIAGNOSIS — I482 Chronic atrial fibrillation, unspecified: Secondary | ICD-10-CM | POA: Diagnosis not present

## 2019-12-13 NOTE — Progress Notes (Signed)
MRN : [id number]  Monica [name] is a 86 y.o. (08-02-1926) female who presents with chief complaint of follow up scalp hematome.  History of Present Illness:  Patient returns to the office status post embolization of the occipital artery on the left secondary to multiple episodes of acute hemorrhage from a scalp hematoma.  Hematoma developed after the patient had a fall.  She is on anticoagulation secondary to atrial fibrillation.  She had presented several times to the hospital with bleeding episodes and required transfusion.  The most recent episode CT scan was obt ained which suggested persistent extravasation from a distal occipital artery.  This prompted angiography which indeed did confirm extravasation and therefore the occipital artery was embolized.  Since this treatment the patient has had no further bleeding and in fact her hematoma has largely resolved.  Today she presents with her only complaint being the stitches in her scalp are sharp and making it very difficult for her to wash her hair.   Current Meds  Medication Sig   albuterol (VENTOLIN HFA) 108 (90 Base) MCG/ACT inhaler Inhale 2 puffs into the lungs every 4 (four) hours as needed for wheezing or shortness of breath.   amLODipine-benazepril (LOTREL) 5-20 MG capsule Take 1 capsule by mouth daily.    apixaban (ELIQUIS) 2.5 MG TABS tablet Take 1 tablet (2.5 mg total) by mouth 2 (two) times daily.   b complex vitamins capsule Take 1 capsule by mouth daily.   desloratadine (CLARINEX) 5 MG tablet Take 1 tablet (5 mg total) by mouth daily.   fluticasone furoate-vilanterol (BREO ELLIPTA) 100-25 MCG/INH AEPB TAKE 1 PUFF ONCE A DAY   furosemide (LASIX) 20 MG tablet Take 1 tablet (20 mg total) by mouth daily as needed for fluid or edema.   gabapentin (NEURONTIN) 300 MG capsule Take 2 capsules (600 mg total) by mouth at bedtime.   Magnesium 200 MG TABS Take 1 tablet (200 mg total) by mouth daily.   metFORMIN (GLUCOPHAGE-XR)  500 MG 24 hr tablet Take 2 tablets (1,000 mg total) by mouth daily.   montelukast (SINGULAIR) 10 MG tablet Take 10 mg by mouth at bedtime.   nystatin cream (MYCOSTATIN) Apply topically 2 (two) times daily.   oxybutynin (DITROPAN-XL) 10 MG 24 hr tablet Take 1 tablet (10 mg total) by mouth daily.   sodium chloride 1 g tablet Take 1 tablet (1 g total) by mouth 3 (three) times daily with meals.   theophylline (THEO-24) 100 MG 24 hr capsule TAKE 1 CAPSULE BY MOUTH EVERY DAY   triamcinolone (KENALOG) 0.025 % cream Apply 1 application topically 2 (two) times daily. Ok to mix with topical lotion and apply to all affected areas.    Past Medical History:  Diagnosis Date   Atrial fibrillation ([place])    Bronchitis    Cancer (HCC)    Left leg growth, kidneys, lungs and breasts   Carcinoma of unknown primary ([place])    COPD (chronic obstructive pulmonary disease) ([place])    Diabetes mellitus without complication ([place])    Hyperlipidemia    Hypertension    Pancreatitis    Pneumonia    Stroke ([place])    TIA's   Vitamin B12 deficiency     Past Surgical History:  Procedure Laterality Date   [place]   CAROTID ANGIOGRAPHY Right 11/23/2019   Procedure: CAROTID ANGIOGRAPHY;  Surgeon: Katha Cabal, MD;  Location: [facility];  Service: Cardiovascular;  Laterality: Right;   COLONOSCOPY     COLONOSCOPY WITH PROPOFOL N/A 01/25/2018   Procedure: COLONOSCOPY WITH PROPOFOL;  Surgeon: [name], MD;  Location: [facility] ENDOSCOPY;  Service: Endoscopy;  Laterality: N/A;   ESOPHAGOGASTRODUODENOSCOPY (EGD) WITH PROPOFOL N/A 01/24/2018   Procedure: ESOPHAGOGASTRODUODENOSCOPY (EGD) WITH PROPOFOL;  Surgeon: [name], MD;  Location: [facility] ENDOSCOPY;  Service: Endoscopy;  Laterality: N/A;   HIP ARTHROPLASTY Left 06/03/2019   Procedure: ARTHROPLASTY BIPOLAR HIP (HEMIARTHROPLASTY);  Surgeon: Dereck Leep, MD;  Location: [facility] ORS;  Service:  Orthopedics;  Laterality: Left;    Social History Social History   Tobacco Use   Smoking status: Former Smoker    Packs/day: 0.50    Years: 15.00    Pack years: 7.50    Types: Cigarettes    Quit date: 11/15/1988    Years since quitting: 31.0   Smokeless tobacco: Never Used  Substance Use Topics   Alcohol use: No    Alcohol/week: 0.0 standard drinks   Drug use: No    Family History Family History  Problem Relation Age of Onset   Cancer Father    Diabetes Brother     Allergies  Allergen Reactions   Sulfa Antibiotics Swelling   Celecoxib Nausea And Vomiting   Acetaminophen Itching   Codeine Rash   Lyrica [Pregabalin] Rash   Penicillin G Rash    Has patient had a PCN reaction causing immediate rash, facial/tongue/throat swelling, SOB or lightheadedness with hypotension: Yes Has patient had a PCN reaction causing severe rash involving mucus membranes or skin necrosis: No Has patient had a PCN reaction that required hospitalization: No Has patient had a PCN reaction occurring within the last 10 years: Unknown If all of the above answers are "NO", then may proceed with Cephalosporin use.   Petrolatum-Zinc Oxide Rash     REVIEW OF SYSTEMS (Negative unless checked)  Constitutional: [] Weight loss  [] Fever  [] Chills Cardiac: [] Chest pain   [] Chest pressure   [] Palpitations   [] Shortness of breath when laying flat   [] Shortness of breath with exertion. Vascular:  [] Pain in legs with walking   [] Pain in legs at rest  [] History of DVT   [] Phlebitis   [] Swelling in legs   [] Varicose veins   [] Non-healing ulcers Pulmonary:   [] Uses home oxygen   [] Productive cough   [] Hemoptysis   [] Wheeze  [] COPD   [] Asthma Neurologic:  [] Dizziness   [] Seizures   [] History of stroke   [] History of TIA  [] Aphasia   [] Vissual changes   [] Weakness or numbness in arm   [] Weakness or numbness in leg Musculoskeletal:   [] Joint swelling   [] Joint pain   [] Low back pain Hematologic:  [] Easy  bruising  [] Easy bleeding   [] Hypercoagulable state   [] Anemic Gastrointestinal:  [] Diarrhea   [] Vomiting  [] Gastroesophageal reflux/heartburn   [] Difficulty swallowing. Genitourinary:  [] Chronic kidney disease   [] Difficult urination  [] Frequent urination   [] Blood in urine Skin:  [] Rashes   [] Ulcers  Psychological:  [] History of anxiety   []  History of major depression.  Physical Examination  There were no vitals filed for this visit. There is no height or weight on file to calculate BMI. Gen: WD/WN, NAD Head: [place]/AT, No temporalis wasting.  Ear/Nose/Throat: Hearing grossly intact, nares w/o erythema or drainage Eyes: PER, EOMI, sclera nonicteric.  Neck: Supple, no large masses.   Pulmonary:  Good air movement, no audible wheezing bilaterally, no use of accessory muscles.  Cardiac: RRR, no JVD Vascular: There is minimal residual  hematoma there is a 3 cm x 2 cm scab which is starting to lift off.  Surrounding tissues are absolutely normal and healthy in appearance.  There are half a dozen nylon stitches present.   Gastrointestinal: Non-distended. No guarding/no peritoneal signs.  Musculoskeletal: M/S 5/5 throughout.  No deformity or atrophy.  Neurologic: CN 2-12 intact. Symmetrical.  Speech is fluent. Motor exam as listed above. Psychiatric: Judgment intact, Mood & affect appropriate for pt's clinical situation. Dermatologic: No rashes or ulcers noted.  No changes consistent with cellulitis.  CBC Lab Results  Component Value Date   WBC 8.5 11/25/2019   HGB 9.0 (L) 11/25/2019   HCT 28.2 (L) 11/25/2019   MCV 78.8 (L) 11/25/2019   PLT 357 11/25/2019    BMET    Component Value Date/Time   NA 136 11/24/2019 0531   NA 136 03/29/2014 0609   K 3.8 11/24/2019 0531   K 4.0 03/29/2014 0609   CL 103 11/24/2019 0531   CL 101 03/29/2014 0609   CO2 22 11/24/2019 0531   CO2 27 03/29/2014 0609   GLUCOSE 116 (H) 11/24/2019 0531   GLUCOSE 226 (H) 03/29/2014 0609   BUN 10 11/24/2019 0531     BUN 28 (H) 03/29/2014 0609   CREATININE 0.73 11/24/2019 0531   CREATININE 1.29 03/29/2014 0609   CALCIUM 8.9 11/24/2019 0531   CALCIUM 9.4 03/29/2014 0609   GFRNONAA >60 11/24/2019 0531   GFRNONAA 37 (L) 03/29/2014 0609   GFRAA >60 11/24/2019 0531   GFRAA 43 (L) 03/29/2014 0609   CrCl cannot be calculated (Unknown ideal weight.).  COAG Lab Results  Component Value Date   INR 1.3 (H) 11/24/2019   INR 2.1 (H) 11/23/2019   INR 3.3 (H) 11/22/2019    Radiology CT Angio Head W or Wo Contrast  Result Date: 11/22/2019 CLINICAL DATA:  Scalp hemorrhage EXAM: CT ANGIOGRAPHY HEAD TECHNIQUE: Multidetector CT imaging of the head was performed using the standard protocol during bolus administration of intravenous contrast. Multiplanar CT image reconstructions and MIPs were obtained to evaluate the vascular anatomy. CONTRAST:  59mL OMNIPAQUE IOHEXOL 350 MG/ML SOLN COMPARISON:  None. FINDINGS: CT HEAD Brain: There is no mass, hemorrhage or extra-axial collection. The size and configuration of the ventricles and extra-axial CSF spaces are normal. There is hypoattenuation of the white matter, most commonly indicating chronic small vessel disease. Vascular: No abnormal hyperdensity of the major intracranial arteries or dural venous sinuses. No intracranial atherosclerosis. Skull: Right parietal scalp hematoma.  No skull fracture. Sinuses/Orbits: No fluid levels or advanced mucosal thickening of the visualized paranasal sinuses. No mastoid or middle ear effusion. The orbits are normal. CTA HEAD POSTERIOR CIRCULATION: --Vertebral arteries: Normal V4 segments. --Posterior inferior cerebellar arteries (PICA): Patent origins from the vertebral arteries. --Anterior inferior cerebellar arteries (AICA): Patent origins from the basilar artery. --Basilar artery: Normal. --Superior cerebellar arteries: Normal. --Posterior cerebral arteries: Normal. Both originate from the basilar artery. Posterior communicating arteries  (p-comm) are diminutive or absent. ANTERIOR CIRCULATION: --Intracranial internal carotid arteries: Atherosclerotic calcification of the internal carotid arteries at the skull base without hemodynamically significant stenosis. --Anterior cerebral arteries (ACA): Normal. Both A1 segments are present. Patent anterior communicating artery (a-comm). --Middle cerebral arteries (MCA): Normal. External carotid artery branches: The right parietal scalp hematoma contains an arterial blush of contrast arising from a branch of the right occipital artery. Venous sinuses: Normal Anatomic variants: None IMPRESSION: 1. Right parietal scalp hematoma with arterial contrast blush arising from a small branch of the right occipital  artery. 2. No intracranial arterial occlusion or high-grade stenosis. 3. No acute intracranial abnormality. Electronically Signed   By: Ulyses Jarred M.D.   On: 11/22/2019 23:17   PERIPHERAL VASCULAR CATHETERIZATION  Result Date: 11/23/2019 See op note    Assessment/Plan 1. Scalp hematoma, subsequent encounter Improved should be completely resolved in a few weeks  2. Pure hypercholesterolemia Continue statin as ordered and reviewed, no changes at this time   3. Non-insulin dependent type 2 diabetes mellitus ([place]) Continue hypoglycemic medications as already ordered, these medications have been reviewed and there are no changes at this time.  Hgb A1C to be monitored as already arranged by primary service   4. Atrial fibrillation, chronic (HCC) Continue antiarrhythmia medications as already ordered, these medications have been reviewed and there are no changes at this time.  Continue anticoagulation as ordered by Cardiology Service    Hortencia Pilar, MD  12/13/2019 11:04 AM

## 2019-12-18 ENCOUNTER — Other Ambulatory Visit: Payer: Self-pay

## 2019-12-20 DIAGNOSIS — I8290 Acute embolism and thrombosis of unspecified vein: Secondary | ICD-10-CM | POA: Diagnosis not present

## 2019-12-20 DIAGNOSIS — E1140 Type 2 diabetes mellitus with diabetic neuropathy, unspecified: Secondary | ICD-10-CM | POA: Diagnosis not present

## 2019-12-20 DIAGNOSIS — S0101XD Laceration without foreign body of scalp, subsequent encounter: Secondary | ICD-10-CM | POA: Diagnosis not present

## 2019-12-20 DIAGNOSIS — E1151 Type 2 diabetes mellitus with diabetic peripheral angiopathy without gangrene: Secondary | ICD-10-CM | POA: Diagnosis not present

## 2019-12-20 DIAGNOSIS — E1165 Type 2 diabetes mellitus with hyperglycemia: Secondary | ICD-10-CM | POA: Diagnosis not present

## 2019-12-20 DIAGNOSIS — S0003XD Contusion of scalp, subsequent encounter: Secondary | ICD-10-CM | POA: Diagnosis not present

## 2019-12-22 DIAGNOSIS — E1140 Type 2 diabetes mellitus with diabetic neuropathy, unspecified: Secondary | ICD-10-CM | POA: Diagnosis not present

## 2019-12-22 DIAGNOSIS — E785 Hyperlipidemia, unspecified: Secondary | ICD-10-CM | POA: Diagnosis not present

## 2019-12-22 DIAGNOSIS — I10 Essential (primary) hypertension: Secondary | ICD-10-CM | POA: Diagnosis not present

## 2019-12-22 DIAGNOSIS — K859 Acute pancreatitis without necrosis or infection, unspecified: Secondary | ICD-10-CM | POA: Diagnosis not present

## 2019-12-22 DIAGNOSIS — I4891 Unspecified atrial fibrillation: Secondary | ICD-10-CM | POA: Diagnosis not present

## 2019-12-22 DIAGNOSIS — S0003XD Contusion of scalp, subsequent encounter: Secondary | ICD-10-CM | POA: Diagnosis not present

## 2019-12-22 DIAGNOSIS — E559 Vitamin D deficiency, unspecified: Secondary | ICD-10-CM | POA: Diagnosis not present

## 2019-12-22 DIAGNOSIS — E538 Deficiency of other specified B group vitamins: Secondary | ICD-10-CM | POA: Diagnosis not present

## 2019-12-22 DIAGNOSIS — C7902 Secondary malignant neoplasm of left kidney and renal pelvis: Secondary | ICD-10-CM | POA: Diagnosis not present

## 2019-12-22 DIAGNOSIS — I8290 Acute embolism and thrombosis of unspecified vein: Secondary | ICD-10-CM | POA: Diagnosis not present

## 2019-12-22 DIAGNOSIS — C7802 Secondary malignant neoplasm of left lung: Secondary | ICD-10-CM | POA: Diagnosis not present

## 2019-12-22 DIAGNOSIS — C7989 Secondary malignant neoplasm of other specified sites: Secondary | ICD-10-CM | POA: Diagnosis not present

## 2019-12-22 DIAGNOSIS — S0101XD Laceration without foreign body of scalp, subsequent encounter: Secondary | ICD-10-CM | POA: Diagnosis not present

## 2019-12-22 DIAGNOSIS — C7901 Secondary malignant neoplasm of right kidney and renal pelvis: Secondary | ICD-10-CM | POA: Diagnosis not present

## 2019-12-22 DIAGNOSIS — E1151 Type 2 diabetes mellitus with diabetic peripheral angiopathy without gangrene: Secondary | ICD-10-CM | POA: Diagnosis not present

## 2019-12-22 DIAGNOSIS — G5600 Carpal tunnel syndrome, unspecified upper limb: Secondary | ICD-10-CM | POA: Diagnosis not present

## 2019-12-22 DIAGNOSIS — D500 Iron deficiency anemia secondary to blood loss (chronic): Secondary | ICD-10-CM | POA: Diagnosis not present

## 2019-12-22 DIAGNOSIS — C801 Malignant (primary) neoplasm, unspecified: Secondary | ICD-10-CM | POA: Diagnosis not present

## 2019-12-22 DIAGNOSIS — J449 Chronic obstructive pulmonary disease, unspecified: Secondary | ICD-10-CM | POA: Diagnosis not present

## 2019-12-22 DIAGNOSIS — E1165 Type 2 diabetes mellitus with hyperglycemia: Secondary | ICD-10-CM | POA: Diagnosis not present

## 2019-12-22 DIAGNOSIS — E7800 Pure hypercholesterolemia, unspecified: Secondary | ICD-10-CM | POA: Diagnosis not present

## 2019-12-22 DIAGNOSIS — C7801 Secondary malignant neoplasm of right lung: Secondary | ICD-10-CM | POA: Diagnosis not present

## 2019-12-22 DIAGNOSIS — G2581 Restless legs syndrome: Secondary | ICD-10-CM | POA: Diagnosis not present

## 2019-12-22 DIAGNOSIS — M109 Gout, unspecified: Secondary | ICD-10-CM | POA: Diagnosis not present

## 2019-12-22 DIAGNOSIS — C7981 Secondary malignant neoplasm of breast: Secondary | ICD-10-CM | POA: Diagnosis not present

## 2020-01-01 DIAGNOSIS — S0003XD Contusion of scalp, subsequent encounter: Secondary | ICD-10-CM | POA: Diagnosis not present

## 2020-01-01 DIAGNOSIS — E1165 Type 2 diabetes mellitus with hyperglycemia: Secondary | ICD-10-CM | POA: Diagnosis not present

## 2020-01-01 DIAGNOSIS — S0101XD Laceration without foreign body of scalp, subsequent encounter: Secondary | ICD-10-CM | POA: Diagnosis not present

## 2020-01-01 DIAGNOSIS — E1151 Type 2 diabetes mellitus with diabetic peripheral angiopathy without gangrene: Secondary | ICD-10-CM | POA: Diagnosis not present

## 2020-01-01 DIAGNOSIS — I8290 Acute embolism and thrombosis of unspecified vein: Secondary | ICD-10-CM | POA: Diagnosis not present

## 2020-01-01 DIAGNOSIS — E1140 Type 2 diabetes mellitus with diabetic neuropathy, unspecified: Secondary | ICD-10-CM | POA: Diagnosis not present

## 2020-01-10 ENCOUNTER — Ambulatory Visit (INDEPENDENT_AMBULATORY_CARE_PROVIDER_SITE_OTHER): Payer: Medicare Other

## 2020-01-10 DIAGNOSIS — Z5189 Encounter for other specified aftercare: Secondary | ICD-10-CM | POA: Diagnosis not present

## 2020-01-10 NOTE — Progress Notes (Signed)
Sign plan of care by dr Humphrey Rolls

## 2020-01-17 ENCOUNTER — Telehealth: Payer: Self-pay

## 2020-01-17 NOTE — Telephone Encounter (Signed)
PLAN OF CARE SIGNED AND PLACED IN [facility].

## 2020-01-21 ENCOUNTER — Other Ambulatory Visit: Payer: Self-pay | Admitting: Adult Health

## 2020-01-21 ENCOUNTER — Other Ambulatory Visit: Payer: Self-pay

## 2020-01-21 DIAGNOSIS — Z7901 Long term (current) use of anticoagulants: Secondary | ICD-10-CM

## 2020-01-21 MED ORDER — THEOPHYLLINE ER 100 MG PO CP24: ORAL_CAPSULE | ORAL | 3 refills | Status: DC

## 2020-02-01 ENCOUNTER — Other Ambulatory Visit: Payer: Self-pay

## 2020-02-01 DIAGNOSIS — R252 Cramp and spasm: Secondary | ICD-10-CM

## 2020-02-01 MED ORDER — HYDROCHLOROTHIAZIDE 25 MG PO TABS: 25.0000 mg | ORAL_TABLET | Freq: Every day | ORAL | 5 refills | Status: DC

## 2020-02-01 MED ORDER — MAGNESIUM 250 MG PO TABS: 1.0000 | ORAL_TABLET | Freq: Every day | ORAL | 5 refills | Status: DC

## 2020-02-01 MED ORDER — MAGNESIUM 200 MG PO TABS: 1.0000 | ORAL_TABLET | Freq: Every day | ORAL | 5 refills | Status: DC

## 2020-02-01 NOTE — Telephone Encounter (Signed)
Pharmacy called and stated that pt was previously on 250 mg of magnesium and 200 mg is an odd dose that they will have to order. Per Nira Conn ok to change to 250 mg daily.

## 2020-02-01 NOTE — Telephone Encounter (Signed)
Spoke with pt she still taking Hctz as per heather its ok to send it

## 2020-02-05 DIAGNOSIS — E119 Type 2 diabetes mellitus without complications: Secondary | ICD-10-CM | POA: Diagnosis not present

## 2020-02-14 ENCOUNTER — Other Ambulatory Visit: Payer: Self-pay

## 2020-02-14 DIAGNOSIS — R350 Frequency of micturition: Secondary | ICD-10-CM

## 2020-02-14 MED ORDER — OXYBUTYNIN CHLORIDE ER 10 MG PO TB24: 10.0000 mg | ORAL_TABLET | Freq: Every day | ORAL | 3 refills | Status: DC

## 2020-02-14 MED ORDER — BREO ELLIPTA 100-25 MCG/INH IN AEPB: INHALATION_SPRAY | RESPIRATORY_TRACT | 3 refills | Status: DC

## 2020-02-18 ENCOUNTER — Other Ambulatory Visit: Payer: Self-pay

## 2020-02-18 MED ORDER — METFORMIN HCL ER 500 MG PO TB24: 750.0000 mg | ORAL_TABLET | Freq: Every day | ORAL | 2 refills | Status: DC

## 2020-02-22 ENCOUNTER — Telehealth: Payer: Self-pay

## 2020-02-22 NOTE — Telephone Encounter (Signed)
Called lmom informing patient of appointment on 02/26/2020. klh 

## 2020-02-26 ENCOUNTER — Ambulatory Visit (INDEPENDENT_AMBULATORY_CARE_PROVIDER_SITE_OTHER): Payer: Medicare Other | Admitting: Adult Health

## 2020-02-26 ENCOUNTER — Other Ambulatory Visit: Payer: Self-pay

## 2020-02-26 ENCOUNTER — Encounter: Payer: Self-pay | Admitting: Adult Health

## 2020-02-26 VITALS — BP 148/68 | HR 86 | Temp 97.4°F | Resp 16 | Ht 64.0 in | Wt 146.6 lb

## 2020-02-26 DIAGNOSIS — E1165 Type 2 diabetes mellitus with hyperglycemia: Secondary | ICD-10-CM | POA: Diagnosis not present

## 2020-02-26 DIAGNOSIS — J449 Chronic obstructive pulmonary disease, unspecified: Secondary | ICD-10-CM | POA: Diagnosis not present

## 2020-02-26 DIAGNOSIS — I10 Essential (primary) hypertension: Secondary | ICD-10-CM

## 2020-02-26 LAB — POCT GLYCOSYLATED HEMOGLOBIN (HGB A1C): Hemoglobin A1C: 6.5 % — AB (ref 4.0–5.6)

## 2020-02-26 NOTE — Progress Notes (Signed)
[facility] [place], [place] [address]  Internal MEDICINE  Office Visit Note  Patient Name: Monica [name]  [id number]  [id number]  Date of Service: 02/26/2020  Chief Complaint  Patient presents with  . Hypertension  . Hyperlipidemia  . Diabetes    HPI  Pt is seen today for follow up on HTN, HLD and DM. Patient reports overall she is doing well.  Her A1C today is 6.5.  Her blood sugars have been well controlled. She denies any issues at this time.  Her blood pressure was slightly elevated today 164/64, she Denies Chest pain, Shortness of breath, palpitations, headache, or blurred vision.      Current Medication: Outpatient Encounter Medications as of 02/26/2020  Medication Sig  . albuterol (VENTOLIN HFA) 108 (90 Base) MCG/ACT inhaler Inhale 2 puffs into the lungs every 4 (four) hours as needed for wheezing or shortness of breath.  Marland Kitchen amLODipine-benazepril (LOTREL) 5-20 MG capsule Take 1 capsule by mouth daily.   Marland Kitchen b complex vitamins capsule Take 1 capsule by mouth daily.  Marland Kitchen desloratadine (CLARINEX) 5 MG tablet Take 1 tablet (5 mg total) by mouth daily.  Marland Kitchen ELIQUIS 2.5 MG TABS tablet TAKE ONE TABLET TWICE DAILY  . fluticasone furoate-vilanterol (BREO ELLIPTA) 100-25 MCG/INH AEPB TAKE 1 PUFF ONCE A DAY  . furosemide (LASIX) 20 MG tablet Take 1 tablet (20 mg total) by mouth daily as needed for fluid or edema.  . gabapentin (NEURONTIN) 300 MG capsule Take 2 capsules (600 mg total) by mouth at bedtime.  Marland Kitchen glucose blood (ONETOUCH ULTRA) test strip 1 each by Other route daily. Use as instructed  . hydrochlorothiazide (HYDRODIURIL) 25 MG tablet Take 1 tablet (25 mg total) by mouth daily.  . Liniments (SALONPAS PAIN RELIEF PATCH EX) Apply topically.  . Magnesium 250 MG TABS Take 1 tablet (250 mg total) by mouth daily.  . metFORMIN (GLUCOPHAGE-XR) 500 MG 24 hr tablet Take 2 tablets (1,000 mg total) by mouth daily.  . montelukast (SINGULAIR) 10 MG tablet Take 10 mg by  mouth at bedtime.  Marland Kitchen nystatin cream (MYCOSTATIN) Apply topically 2 (two) times daily.  Marland Kitchen oxybutynin (DITROPAN-XL) 10 MG 24 hr tablet Take 1 tablet (10 mg total) by mouth daily.  . sodium chloride 1 g tablet Take 1 tablet (1 g total) by mouth 3 (three) times daily with meals.  . theophylline (THEO-24) 100 MG 24 hr capsule TAKE 1 CAPSULE BY MOUTH EVERY DAY  . triamcinolone (KENALOG) 0.025 % cream Apply 1 application topically 2 (two) times daily. Ok to mix with topical lotion and apply to all affected areas.  . [DISCONTINUED] famotidine (PEPCID) 20 MG tablet Take 1 tablet (20 mg total) by mouth 2 (two) times daily.  . [DISCONTINUED] pravastatin (PRAVACHOL) 40 MG tablet Take 1 tablet (40 mg total) by mouth daily.   No facility-administered encounter medications on file as of 02/26/2020.    Surgical History: Past Surgical History:  Procedure Laterality Date  . ABDOMINAL HYSTERECTOMY  1975  . BLADDER SUSPENSION  1989  . CAROTID ANGIOGRAPHY Right 11/23/2019   Procedure: CAROTID ANGIOGRAPHY;  Surgeon: Katha Cabal, MD;  Location: [facility];  Service: Cardiovascular;  Laterality: Right;  . COLONOSCOPY    . COLONOSCOPY WITH PROPOFOL N/A 01/25/2018   Procedure: COLONOSCOPY WITH PROPOFOL;  Surgeon: [name], MD;  [facility] ENDOSCOPY;  Service: Endoscopy;  Laterality: N/A;  . ESOPHAGOGASTRODUODENOSCOPY (EGD) WITH PROPOFOL N/A 01/24/2018   Procedure: ESOPHAGOGASTRODUODENOSCOPY (EGD) WITH PROPOFOL;  Surgeon: [name], MD;  Location: [facility] ENDOSCOPY;  Service: Endoscopy;  Laterality: N/A;  . HIP ARTHROPLASTY Left 06/03/2019   Procedure: ARTHROPLASTY BIPOLAR HIP (HEMIARTHROPLASTY);  Surgeon: Dereck Leep, MD;  Location: [facility] ORS;  Service: Orthopedics;  Laterality: Left;    Medical History: Past Medical History:  Diagnosis Date  . Atrial fibrillation ([place])   . Bronchitis   . Cancer ([place])    Left leg growth, kidneys, lungs and breasts  . Carcinoma of unknown primary ([place])    . COPD (chronic obstructive pulmonary disease) ([place])   . Diabetes mellitus without complication ([place])   . Hyperlipidemia   . Hypertension   . Pancreatitis   . Pneumonia   . Stroke ([place])    TIA's  . Vitamin B12 deficiency     Family History: Family History  Problem Relation Age of Onset  . Cancer Father   . Diabetes Brother     Social History   Socioeconomic History  . Marital status: Divorced    Spouse name: Not on file  . Number of children: Not on file  . Years of education: Not on file  . Highest education level: Not on file  Occupational History  . Not on file  Tobacco Use  . Smoking status: Former Smoker    Packs/day: 0.50    Years: 15.00    Pack years: 7.50    Types: Cigarettes    Quit date: 11/15/1988    Years since quitting: 31.3  . Smokeless tobacco: Never Used  Substance and Sexual Activity  . Alcohol use: No    Alcohol/week: 0.0 standard drinks  . Drug use: No  . Sexual activity: Not on file    Comment: hysterectomy   Other Topics Concern  . Not on file  Social History Narrative  . Not on file   Social Determinants of Health   Financial Resource Strain:   . Difficulty of Paying Living Expenses:   Food Insecurity:   . Worried About Charity fundraiser in the Last Year:   . Arboriculturist in the Last Year:   Transportation Needs:   . Film/video editor (Medical):   Marland Kitchen Lack of Transportation (Non-Medical):   Physical Activity:   . Days of Exercise per Week:   . Minutes of Exercise per Session:   Stress:   . Feeling of Stress :   Social Connections:   . Frequency of Communication with Friends and Family:   . Frequency of Social Gatherings with Friends and Family:   . Attends Religious Services:   . Active Member of Clubs or Organizations:   . Attends Archivist Meetings:   Marland Kitchen Marital Status:   Intimate Partner Violence:   . Fear of Current or Ex-Partner:   . Emotionally Abused:   Marland Kitchen Physically Abused:   . Sexually Abused:        Review of Systems  Constitutional: Negative for chills, fatigue and unexpected weight change.  HENT: Negative for congestion, rhinorrhea, sneezing and sore throat.   Eyes: Negative for photophobia, pain and redness.  Respiratory: Negative for cough, chest tightness and shortness of breath.   Cardiovascular: Negative for chest pain and palpitations.  Gastrointestinal: Negative for abdominal pain, constipation, diarrhea, nausea and vomiting.  Endocrine: Negative.   Genitourinary: Negative for dysuria and frequency.  Musculoskeletal: Negative for arthralgias, back pain, joint swelling and neck pain.  Skin: Negative for rash.  Allergic/Immunologic: Negative.   Neurological: Negative for tremors and numbness.  Hematological:  Negative for adenopathy. Does not bruise/bleed easily.  Psychiatric/Behavioral: Negative for behavioral problems and sleep disturbance. The patient is not nervous/anxious.     Vital Signs: BP (!) 164/64   Pulse 86   Temp (!) 97.4 F (36.3 C)   Resp 16   Ht 5\' 4"  (1.626 m)   Wt 146 lb 9.6 oz (66.5 kg)   SpO2 96%   BMI 25.16 kg/m    Physical Exam Vitals and nursing note reviewed.  Constitutional:      General: She is not in acute distress.    Appearance: She is well-developed. She is not diaphoretic.  HENT:     Head: Normocephalic and atraumatic.     Mouth/Throat:     Pharynx: No oropharyngeal exudate.  Eyes:     Pupils: Pupils are equal, round, and reactive to light.  Neck:     Thyroid: No thyromegaly.     Vascular: No JVD.     Trachea: No tracheal deviation.  Cardiovascular:     Rate and Rhythm: Normal rate and regular rhythm.     Heart sounds: Normal heart sounds. No murmur. No friction rub. No gallop.   Pulmonary:     Effort: Pulmonary effort is normal. No respiratory distress.     Breath sounds: Normal breath sounds. No wheezing or rales.  Chest:     Chest wall: No tenderness.  Abdominal:     Palpations: Abdomen is soft.      Tenderness: There is no abdominal tenderness. There is no guarding.  Musculoskeletal:        General: Normal range of motion.     Cervical back: Normal range of motion and neck supple.  Lymphadenopathy:     Cervical: No cervical adenopathy.  Skin:    General: Skin is warm and dry.  Neurological:     Mental Status: She is alert and oriented to person, place, and time.     Cranial Nerves: No cranial nerve deficit.  Psychiatric:        Behavior: Behavior normal.        Thought Content: Thought content normal.        Judgment: Judgment normal.     Assessment/Plan: 1. Type 2 diabetes mellitus with hyperglycemia, unspecified whether long term insulin use (HCC) A1C is is 6.5.  Patient is doing well. Continue current management.  - POCT HgB A1C  2. Essential hypertension Stable, continue to monitor. Call office or go to Emergency room is symptoms such as headache, dizziness, or blurred vision occur.   3. Chronic obstructive pulmonary disease, unspecified COPD type ([place]) Controlled, continue present management.   General Counseling: Natane verbalizes understanding of the findings of todays visit and agrees with plan of treatment. I have discussed any further diagnostic evaluation that may be needed or ordered today. We also reviewed her medications today. she has been encouraged to call the office with any questions or concerns that should arise related to todays visit.    Orders Placed This Encounter  Procedures  . POCT HgB A1C    No orders of the defined types were placed in this encounter.   Time spent: 30 Minutes   This patient was seen by Orson Gear AGNP-C in Collaboration with Dr Lavera Guise as a part of collaborative care agreement     Kendell Bane AGNP-C Internal medicine

## 2020-03-19 ENCOUNTER — Other Ambulatory Visit: Payer: Self-pay

## 2020-03-19 DIAGNOSIS — L209 Atopic dermatitis, unspecified: Secondary | ICD-10-CM

## 2020-03-19 DIAGNOSIS — L239 Allergic contact dermatitis, unspecified cause: Secondary | ICD-10-CM

## 2020-03-19 MED ORDER — AMLODIPINE BESY-BENAZEPRIL HCL 5-20 MG PO CAPS: 1.0000 | ORAL_CAPSULE | Freq: Every day | ORAL | 1 refills | Status: DC

## 2020-03-19 MED ORDER — GABAPENTIN 300 MG PO CAPS: 600.0000 mg | ORAL_CAPSULE | Freq: Every day | ORAL | 5 refills | Status: DC

## 2020-03-19 MED ORDER — DESLORATADINE 5 MG PO TABS: 5.0000 mg | ORAL_TABLET | Freq: Every day | ORAL | 3 refills | Status: DC

## 2020-04-18 ENCOUNTER — Other Ambulatory Visit: Payer: Self-pay

## 2020-04-18 MED ORDER — METFORMIN HCL ER 500 MG PO TB24: 750.0000 mg | ORAL_TABLET | Freq: Every day | ORAL | 2 refills | Status: DC

## 2020-04-19 ENCOUNTER — Inpatient Hospital Stay
Admission: EM | Admit: 2020-04-19 | Discharge: 2020-04-24 | DRG: 522 | Disposition: A | Discharge status: Skilled Nursing Facility | Payer: Medicare Other | Attending: Hospitalist | Admitting: Hospitalist

## 2020-04-19 ENCOUNTER — Emergency Department: Payer: Medicare Other

## 2020-04-19 ENCOUNTER — Encounter: Payer: Self-pay | Admitting: *Deleted

## 2020-04-19 ENCOUNTER — Other Ambulatory Visit: Payer: Self-pay

## 2020-04-19 DIAGNOSIS — M6281 Muscle weakness (generalized): Secondary | ICD-10-CM | POA: Diagnosis not present

## 2020-04-19 DIAGNOSIS — E7800 Pure hypercholesterolemia, unspecified: Secondary | ICD-10-CM

## 2020-04-19 DIAGNOSIS — Z79899 Other long term (current) drug therapy: Secondary | ICD-10-CM | POA: Diagnosis not present

## 2020-04-19 DIAGNOSIS — S72031A Displaced midcervical fracture of right femur, initial encounter for closed fracture: Secondary | ICD-10-CM | POA: Diagnosis not present

## 2020-04-19 DIAGNOSIS — E785 Hyperlipidemia, unspecified: Secondary | ICD-10-CM | POA: Diagnosis present

## 2020-04-19 DIAGNOSIS — R2689 Other abnormalities of gait and mobility: Secondary | ICD-10-CM | POA: Diagnosis not present

## 2020-04-19 DIAGNOSIS — Z96642 Presence of left artificial hip joint: Secondary | ICD-10-CM | POA: Diagnosis present

## 2020-04-19 DIAGNOSIS — S7290XA Unspecified fracture of unspecified femur, initial encounter for closed fracture: Secondary | ICD-10-CM | POA: Diagnosis not present

## 2020-04-19 DIAGNOSIS — R296 Repeated falls: Secondary | ICD-10-CM | POA: Diagnosis not present

## 2020-04-19 DIAGNOSIS — E119 Type 2 diabetes mellitus without complications: Secondary | ICD-10-CM | POA: Diagnosis present

## 2020-04-19 DIAGNOSIS — Z471 Aftercare following joint replacement surgery: Secondary | ICD-10-CM | POA: Diagnosis not present

## 2020-04-19 DIAGNOSIS — R5381 Other malaise: Secondary | ICD-10-CM | POA: Diagnosis not present

## 2020-04-19 DIAGNOSIS — E871 Hypo-osmolality and hyponatremia: Secondary | ICD-10-CM | POA: Diagnosis present

## 2020-04-19 DIAGNOSIS — Z741 Need for assistance with personal care: Secondary | ICD-10-CM | POA: Diagnosis not present

## 2020-04-19 DIAGNOSIS — Z20822 Contact with and (suspected) exposure to covid-19: Secondary | ICD-10-CM | POA: Diagnosis present

## 2020-04-19 DIAGNOSIS — Z7901 Long term (current) use of anticoagulants: Secondary | ICD-10-CM | POA: Diagnosis not present

## 2020-04-19 DIAGNOSIS — W19XXXA Unspecified fall, initial encounter: Secondary | ICD-10-CM

## 2020-04-19 DIAGNOSIS — S72001A Fracture of unspecified part of neck of right femur, initial encounter for closed fracture: Principal | ICD-10-CM | POA: Diagnosis present

## 2020-04-19 DIAGNOSIS — J449 Chronic obstructive pulmonary disease, unspecified: Secondary | ICD-10-CM | POA: Diagnosis not present

## 2020-04-19 DIAGNOSIS — R52 Pain, unspecified: Secondary | ICD-10-CM | POA: Diagnosis not present

## 2020-04-19 DIAGNOSIS — I482 Chronic atrial fibrillation, unspecified: Secondary | ICD-10-CM | POA: Diagnosis not present

## 2020-04-19 DIAGNOSIS — Z86718 Personal history of other venous thrombosis and embolism: Secondary | ICD-10-CM | POA: Diagnosis not present

## 2020-04-19 DIAGNOSIS — Z8673 Personal history of transient ischemic attack (TIA), and cerebral infarction without residual deficits: Secondary | ICD-10-CM | POA: Diagnosis not present

## 2020-04-19 DIAGNOSIS — Z90710 Acquired absence of both cervix and uterus: Secondary | ICD-10-CM | POA: Diagnosis not present

## 2020-04-19 DIAGNOSIS — D500 Iron deficiency anemia secondary to blood loss (chronic): Secondary | ICD-10-CM | POA: Diagnosis not present

## 2020-04-19 DIAGNOSIS — Y92009 Unspecified place in unspecified non-institutional (private) residence as the place of occurrence of the external cause: Secondary | ICD-10-CM

## 2020-04-19 DIAGNOSIS — M542 Cervicalgia: Secondary | ICD-10-CM | POA: Diagnosis not present

## 2020-04-19 DIAGNOSIS — W010XXA Fall on same level from slipping, tripping and stumbling without subsequent striking against object, initial encounter: Secondary | ICD-10-CM | POA: Diagnosis present

## 2020-04-19 DIAGNOSIS — I10 Essential (primary) hypertension: Secondary | ICD-10-CM | POA: Diagnosis present

## 2020-04-19 DIAGNOSIS — R278 Other lack of coordination: Secondary | ICD-10-CM | POA: Diagnosis not present

## 2020-04-19 DIAGNOSIS — Z87891 Personal history of nicotine dependence: Secondary | ICD-10-CM | POA: Diagnosis not present

## 2020-04-19 DIAGNOSIS — Z736 Limitation of activities due to disability: Secondary | ICD-10-CM | POA: Diagnosis not present

## 2020-04-19 DIAGNOSIS — Z96641 Presence of right artificial hip joint: Secondary | ICD-10-CM | POA: Diagnosis not present

## 2020-04-19 DIAGNOSIS — S199XXA Unspecified injury of neck, initial encounter: Secondary | ICD-10-CM | POA: Diagnosis not present

## 2020-04-19 DIAGNOSIS — S72002A Fracture of unspecified part of neck of left femur, initial encounter for closed fracture: Secondary | ICD-10-CM | POA: Diagnosis not present

## 2020-04-19 DIAGNOSIS — S299XXA Unspecified injury of thorax, initial encounter: Secondary | ICD-10-CM | POA: Diagnosis not present

## 2020-04-19 DIAGNOSIS — R600 Localized edema: Secondary | ICD-10-CM | POA: Diagnosis not present

## 2020-04-19 DIAGNOSIS — S0990XA Unspecified injury of head, initial encounter: Secondary | ICD-10-CM | POA: Diagnosis not present

## 2020-04-19 DIAGNOSIS — Z96649 Presence of unspecified artificial hip joint: Secondary | ICD-10-CM

## 2020-04-19 DIAGNOSIS — R41841 Cognitive communication deficit: Secondary | ICD-10-CM | POA: Diagnosis not present

## 2020-04-19 DIAGNOSIS — R279 Unspecified lack of coordination: Secondary | ICD-10-CM | POA: Diagnosis not present

## 2020-04-19 DIAGNOSIS — R1310 Dysphagia, unspecified: Secondary | ICD-10-CM | POA: Diagnosis not present

## 2020-04-19 DIAGNOSIS — S72001D Fracture of unspecified part of neck of right femur, subsequent encounter for closed fracture with routine healing: Secondary | ICD-10-CM | POA: Diagnosis not present

## 2020-04-19 LAB — CBC WITH DIFFERENTIAL/PLATELET
Abs Immature Granulocytes: 0.04 10*3/uL (ref 0.00–0.07)
Basophils Absolute: 0.1 10*3/uL (ref 0.0–0.1)
Basophils Relative: 1 %
Eosinophils Absolute: 0.2 10*3/uL (ref 0.0–0.5)
Eosinophils Relative: 3 %
HCT: 31.6 % — ABNORMAL LOW (ref 36.0–46.0)
Hemoglobin: 9.2 g/dL — ABNORMAL LOW (ref 12.0–15.0)
Immature Granulocytes: 0 %
Lymphocytes Relative: 17 %
Lymphs Abs: 1.5 10*3/uL (ref 0.7–4.0)
MCH: 20.4 pg — ABNORMAL LOW (ref 26.0–34.0)
MCHC: 29.1 g/dL — ABNORMAL LOW (ref 30.0–36.0)
MCV: 70.1 fL — ABNORMAL LOW (ref 80.0–100.0)
Monocytes Absolute: 0.6 10*3/uL (ref 0.1–1.0)
Monocytes Relative: 7 %
Neutro Abs: 6.5 10*3/uL (ref 1.7–7.7)
Neutrophils Relative %: 72 %
Platelets: 327 10*3/uL (ref 150–400)
RBC: 4.51 MIL/uL (ref 3.87–5.11)
RDW: 19.5 % — ABNORMAL HIGH (ref 11.5–15.5)
WBC: 8.9 10*3/uL (ref 4.0–10.5)
nRBC: 0 % (ref 0.0–0.2)

## 2020-04-19 LAB — PROTIME-INR
INR: 1.2 (ref 0.8–1.2)
Prothrombin Time: 14.6 seconds (ref 11.4–15.2)

## 2020-04-19 LAB — MRSA PCR SCREENING: MRSA by PCR: NEGATIVE

## 2020-04-19 LAB — BASIC METABOLIC PANEL
Anion gap: 9 (ref 5–15)
BUN: 19 mg/dL (ref 8–23)
CO2: 27 mmol/L (ref 22–32)
Calcium: 9.3 mg/dL (ref 8.9–10.3)
Chloride: 101 mmol/L (ref 98–111)
Creatinine, Ser: 0.84 mg/dL (ref 0.44–1.00)
GFR calc Af Amer: >60 mL/min (ref >60)
GFR calc non Af Amer: >60 mL/min (ref >60)
Glucose, Bld: 151 mg/dL — ABNORMAL HIGH (ref 70–99)
Potassium: 4.1 mmol/L (ref 3.5–5.1)
Sodium: 137 mmol/L (ref 135–145)

## 2020-04-19 LAB — TYPE AND SCREEN
ABO/RH(D): B POS
Antibody Screen: NEGATIVE

## 2020-04-19 LAB — BRAIN NATRIURETIC PEPTIDE: B Natriuretic Peptide: 137.9 pg/mL — ABNORMAL HIGH (ref 0.0–100.0)

## 2020-04-19 LAB — APTT
aPTT: 27 seconds (ref 24–36)
aPTT: 29 seconds (ref 24–36)

## 2020-04-19 LAB — SARS CORONAVIRUS 2 BY RT PCR (HOSPITAL ORDER, PERFORMED IN ~~LOC~~ HOSPITAL LAB): SARS Coronavirus 2: NEGATIVE

## 2020-04-19 LAB — HEPARIN LEVEL (UNFRACTIONATED): Heparin Unfractionated: 2.78 IU/mL — ABNORMAL HIGH (ref 0.30–0.70)

## 2020-04-19 LAB — GLUCOSE, CAPILLARY: Glucose-Capillary: 189 mg/dL — ABNORMAL HIGH (ref 70–99)

## 2020-04-19 MED ORDER — GABAPENTIN 300 MG PO CAPS
600.0000 mg | ORAL_CAPSULE | Freq: Every day | ORAL | Status: DC
  Administered 2020-04-19 – 2020-04-23 (×5): 600 mg via ORAL
  Filled 2020-04-19 (×5): qty 2

## 2020-04-19 MED ORDER — INSULIN ASPART 100 UNIT/ML ~~LOC~~ SOLN
0.0000 [IU] | Freq: Every day | SUBCUTANEOUS | Status: DC
  Administered 2020-04-20: 2 [IU] via SUBCUTANEOUS
  Filled 2020-04-19: qty 1

## 2020-04-19 MED ORDER — RENA-VITE PO TABS
1.0000 | ORAL_TABLET | Freq: Every day | ORAL | Status: DC
  Administered 2020-04-20: 1 via ORAL
  Filled 2020-04-19: qty 1

## 2020-04-19 MED ORDER — OXYCODONE HCL 5 MG PO TABS
5.0000 mg | ORAL_TABLET | Freq: Four times a day (QID) | ORAL | Status: DC | PRN
  Administered 2020-04-19 – 2020-04-24 (×8): 5 mg via ORAL
  Filled 2020-04-19 (×8): qty 1

## 2020-04-19 MED ORDER — FLUTICASONE FUROATE-VILANTEROL 100-25 MCG/INH IN AEPB
1.0000 | INHALATION_SPRAY | Freq: Every day | RESPIRATORY_TRACT | Status: DC
  Administered 2020-04-19 – 2020-04-24 (×6): 1 via RESPIRATORY_TRACT
  Filled 2020-04-19: qty 28

## 2020-04-19 MED ORDER — ALBUTEROL SULFATE (2.5 MG/3ML) 0.083% IN NEBU
2.5000 mg | INHALATION_SOLUTION | RESPIRATORY_TRACT | Status: DC | PRN
  Administered 2020-04-20: 2.5 mg via RESPIRATORY_TRACT
  Filled 2020-04-19: qty 3

## 2020-04-19 MED ORDER — LORATADINE 10 MG PO TABS
10.0000 mg | ORAL_TABLET | Freq: Every day | ORAL | Status: DC
  Administered 2020-04-20 – 2020-04-24 (×5): 10 mg via ORAL
  Filled 2020-04-19 (×5): qty 1

## 2020-04-19 MED ORDER — MONTELUKAST SODIUM 10 MG PO TABS
10.0000 mg | ORAL_TABLET | Freq: Every day | ORAL | Status: DC
  Administered 2020-04-19 – 2020-04-23 (×5): 10 mg via ORAL
  Filled 2020-04-19 (×5): qty 1

## 2020-04-19 MED ORDER — HYDROCHLOROTHIAZIDE 25 MG PO TABS
25.0000 mg | ORAL_TABLET | Freq: Every day | ORAL | Status: DC
  Administered 2020-04-20 – 2020-04-24 (×5): 25 mg via ORAL
  Filled 2020-04-19 (×5): qty 1

## 2020-04-19 MED ORDER — METHOCARBAMOL 500 MG PO TABS
500.0000 mg | ORAL_TABLET | Freq: Three times a day (TID) | ORAL | Status: DC | PRN
  Administered 2020-04-19 – 2020-04-22 (×2): 500 mg via ORAL
  Filled 2020-04-19 (×3): qty 1

## 2020-04-19 MED ORDER — MORPHINE SULFATE (PF) 2 MG/ML IV SOLN: 0.5000 mg | INTRAVENOUS | Status: DC | PRN

## 2020-04-19 MED ORDER — MAGNESIUM OXIDE 400 (241.3 MG) MG PO TABS
200.0000 mg | ORAL_TABLET | Freq: Every day | ORAL | Status: DC
  Administered 2020-04-20 – 2020-04-24 (×5): 200 mg via ORAL
  Filled 2020-04-19 (×6): qty 1

## 2020-04-19 MED ORDER — HEPARIN (PORCINE) 25000 UT/250ML-% IV SOLN
1050.0000 [IU]/h | INTRAVENOUS | Status: DC
  Administered 2020-04-19: 1100 [IU]/h via INTRAVENOUS
  Administered 2020-04-20: 1050 [IU]/h via INTRAVENOUS
  Filled 2020-04-19 (×2): qty 250

## 2020-04-19 MED ORDER — BENAZEPRIL HCL 20 MG PO TABS
20.0000 mg | ORAL_TABLET | Freq: Every day | ORAL | Status: DC
  Administered 2020-04-20 – 2020-04-24 (×5): 20 mg via ORAL
  Filled 2020-04-19 (×5): qty 1

## 2020-04-19 MED ORDER — OXYBUTYNIN CHLORIDE ER 5 MG PO TB24
10.0000 mg | ORAL_TABLET | Freq: Every day | ORAL | Status: DC
  Administered 2020-04-20 – 2020-04-24 (×5): 10 mg via ORAL
  Filled 2020-04-19 (×5): qty 2

## 2020-04-19 MED ORDER — AMLODIPINE BESY-BENAZEPRIL HCL 5-20 MG PO CAPS: 1.0000 | ORAL_CAPSULE | Freq: Every day | ORAL | Status: DC

## 2020-04-19 MED ORDER — ONDANSETRON HCL 4 MG/2ML IJ SOLN: 4.0000 mg | Freq: Three times a day (TID) | INTRAMUSCULAR | Status: DC | PRN

## 2020-04-19 MED ORDER — ALBUTEROL SULFATE HFA 108 (90 BASE) MCG/ACT IN AERS: 2.0000 | INHALATION_SPRAY | RESPIRATORY_TRACT | Status: DC | PRN

## 2020-04-19 MED ORDER — AMLODIPINE BESYLATE 5 MG PO TABS
5.0000 mg | ORAL_TABLET | Freq: Every day | ORAL | Status: DC
  Administered 2020-04-20 – 2020-04-24 (×5): 5 mg via ORAL
  Filled 2020-04-19 (×5): qty 1

## 2020-04-19 MED ORDER — SODIUM CHLORIDE 1 G PO TABS
1.0000 g | ORAL_TABLET | Freq: Three times a day (TID) | ORAL | Status: DC
  Administered 2020-04-20 – 2020-04-24 (×13): 1 g via ORAL
  Filled 2020-04-19 (×15): qty 1

## 2020-04-19 MED ORDER — DM-GUAIFENESIN ER 30-600 MG PO TB12
1.0000 | ORAL_TABLET | Freq: Two times a day (BID) | ORAL | Status: DC | PRN
  Filled 2020-04-19: qty 1

## 2020-04-19 MED ORDER — FENTANYL CITRATE (PF) 100 MCG/2ML IJ SOLN
25.0000 ug | Freq: Once | INTRAMUSCULAR | Status: AC
  Administered 2020-04-19: 25 ug via INTRAVENOUS
  Filled 2020-04-19: qty 2

## 2020-04-19 MED ORDER — HYDRALAZINE HCL 20 MG/ML IJ SOLN
5.0000 mg | INTRAMUSCULAR | Status: DC | PRN
  Administered 2020-04-20: 5 mg via INTRAVENOUS
  Filled 2020-04-19 (×2): qty 0.25

## 2020-04-19 MED ORDER — THEOPHYLLINE ER 100 MG PO CP24
100.0000 mg | ORAL_CAPSULE | Freq: Every day | ORAL | Status: DC
  Administered 2020-04-20 – 2020-04-24 (×5): 100 mg via ORAL
  Filled 2020-04-19 (×5): qty 1

## 2020-04-19 MED ORDER — INSULIN ASPART 100 UNIT/ML ~~LOC~~ SOLN
0.0000 [IU] | Freq: Three times a day (TID) | SUBCUTANEOUS | Status: DC
  Administered 2020-04-20: 1 [IU] via SUBCUTANEOUS
  Administered 2020-04-20: 2 [IU] via SUBCUTANEOUS
  Administered 2020-04-20 – 2020-04-21 (×3): 1 [IU] via SUBCUTANEOUS
  Administered 2020-04-22: 2 [IU] via SUBCUTANEOUS
  Administered 2020-04-22: 1 [IU] via SUBCUTANEOUS
  Administered 2020-04-22: 2 [IU] via SUBCUTANEOUS
  Administered 2020-04-23: 3 [IU] via SUBCUTANEOUS
  Administered 2020-04-23 (×2): 2 [IU] via SUBCUTANEOUS
  Administered 2020-04-24: 1 [IU] via SUBCUTANEOUS
  Administered 2020-04-24: 2 [IU] via SUBCUTANEOUS
  Filled 2020-04-19 (×13): qty 1

## 2020-04-19 MED ORDER — SENNOSIDES-DOCUSATE SODIUM 8.6-50 MG PO TABS: 1.0000 | ORAL_TABLET | Freq: Every evening | ORAL | Status: DC | PRN

## 2020-04-19 NOTE — H&P (Signed)
History and Physical    Monica [name]:[id number] DOB: 30-Jan-1926 DOA: 04/19/2020  Referring MD/NP/PA:   PCP: [name], NP   Patient coming from:  The patient is coming from independent living facility.  At baseline, pt is partially dependent for most of ADL.        Chief Complaint: fall and right hip pain  HPI: Monica [name] is a 86 y.o. 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  Per her daughter who is at beside, pt tripped over her bedspread while making her bed. No LOC, head or neck injury. She developed pain in the right hip, which is constant, sharp, severe, nonradiating.  Patient does not have chest pain, shortness breath, cough.  No nausea vomiting, diarrhea, abdominal pain, symptoms of UTI.  No unilateral numbness or tingling is extremities.  Patient's last dose of Eliquis was this morning  ED Course: pt was found to have WBC 8.9, INR 1.2, electrolytes renal function okay, WBC 8.9, temperature normal, blood pressure 160/83, heart rate 87, RR 18, oxygen saturation 96% on 2 L nasal cannula oxygen. Pending Covid19 PCR.  X-ray of pelvis showed right femoral neck fracture. Pt is admitted to [facility] bed as inpatient  # CT of head is negative for acute intracranial abnormalities.    # CT of C-spine showed degenerative disc disease and AP narrowing of spinal canal at C4 due to calcification of the posterior longitudinal ligament question slightly flattening ventral aspect of spinal cord. Pt is   # CXR showed:  1. Chronic reticulonodular opacities at the left lung base, likely from residuum of chronic infection. Stable adjacent blunting of the left lateral costophrenic angle. 2. Fullness in the region of the main pulmonary artery could possibly be from pulmonary arterial  hypertension or underlying lesion/adenopathy. 3. Low lung volumes. 4. Mild cardiomegaly.  Review of Systems:   General: no fevers, chills, no body weight gain, has fatigue HEENT: no blurry vision, hearing changes or sore throat Respiratory: no dyspnea, coughing, wheezing CV: no chest pain, no palpitations GI: no nausea, vomiting, abdominal pain, diarrhea, constipation GU: no dysuria, burning on urination, increased urinary frequency, hematuria  Ext: has trace leg edema Neuro: no unilateral weakness, numbness, or tingling, no vision change or hearing loss. Has fall. Skin: no rash, no skin tear. MSK: has left hip pain Heme: No easy bruising.  Travel history: No recent long distant travel.  Allergy:  Allergies  Allergen Reactions  . Sulfa Antibiotics Swelling  . Celecoxib Nausea And Vomiting  . Acetaminophen Itching  . Codeine Rash  . Lyrica [Pregabalin] Rash  . Penicillin G Rash    Has patient had a PCN reaction causing immediate rash, facial/tongue/throat swelling, SOB or lightheadedness with hypotension: Yes Has patient had a PCN reaction causing severe rash involving mucus membranes or skin necrosis: No Has patient had a PCN reaction that required hospitalization: No Has patient had a PCN reaction occurring within the last 10 years: Unknown If all of the above answers are "NO", then may proceed with Cephalosporin use.  Marland Kitchen Petrolatum-Zinc Oxide Rash    Past Medical History:  Diagnosis Date  . Atrial fibrillation ([place])   . Bronchitis   . Cancer ([place])    Left leg growth, kidneys, lungs and breasts  . Carcinoma of unknown primary ([place])   . COPD (chronic obstructive  pulmonary disease) ([place])   . Diabetes mellitus without complication ([place] [employer])   . Hyperlipidemia   . Hypertension   . Pancreatitis   . Pneumonia   . Stroke ([place])    TIA's  . Vitamin B12 deficiency     Past Surgical History:  Procedure Laterality Date  . ABDOMINAL HYSTERECTOMY  1975  . BLADDER SUSPENSION   1989  . CAROTID ANGIOGRAPHY Right 11/23/2019   Procedure: CAROTID ANGIOGRAPHY;  Surgeon: Katha Cabal, MD;  Location: [place] [facility] LAB;  Service: Cardiovascular;  Laterality: Right;  . COLONOSCOPY    . COLONOSCOPY WITH PROPOFOL N/A 01/25/2018   Procedure: COLONOSCOPY WITH PROPOFOL;  Surgeon: [name], MD;  Location: [facility] ENDOSCOPY;  Service: Endoscopy;  Laterality: N/A;  . ESOPHAGOGASTRODUODENOSCOPY (EGD) WITH PROPOFOL N/A 01/24/2018   Procedure: ESOPHAGOGASTRODUODENOSCOPY (EGD) WITH PROPOFOL;  Surgeon: [name], MD;  Location: [facility] ENDOSCOPY;  Service: Endoscopy;  Laterality: N/A;  . HIP ARTHROPLASTY Left 06/03/2019   Procedure: ARTHROPLASTY BIPOLAR HIP (HEMIARTHROPLASTY);  Surgeon: Dereck Leep, MD;  Location: [facility] ORS;  Service: Orthopedics;  Laterality: Left;    Social History:  reports that she quit smoking about 31 years ago. Her smoking use included cigarettes. She has a 7.50 pack-year smoking history. She has never used smokeless tobacco. She reports that she does not drink alcohol or use drugs.  Family History:  Family History  Problem Relation Age of Onset  . Cancer Father   . Diabetes Brother      Prior to Admission medications   Medication Sig Start Date End Date Taking? Authorizing Provider  amLODipine-benazepril (LOTREL) 5-20 MG capsule Take 1 capsule by mouth daily. 03/19/20  Yes [name], NP  b complex vitamins capsule Take 1 capsule by mouth daily.   Yes [provider]  desloratadine (CLARINEX) 5 MG tablet Take 1 tablet (5 mg total) by mouth daily. 03/19/20  Yes Boscia, Heather E, NP  ELIQUIS 2.5 MG TABS tablet TAKE ONE TABLET TWICE DAILY Patient taking differently: Take 2.5 mg by mouth 2 (two) times daily.  01/21/20  Yes Scarboro, Audie Clear, NP  fluticasone furoate-vilanterol (BREO ELLIPTA) 100-25 MCG/INH AEPB TAKE 1 PUFF ONCE A DAY 02/14/20  Yes Scarboro, Audie Clear, NP  furosemide (LASIX) 20 MG tablet Take 1 tablet (20 mg total) by mouth daily as  needed for fluid or edema. 11/25/19  Yes Dhungel, Nishant, MD  gabapentin (NEURONTIN) 300 MG capsule Take 2 capsules (600 mg total) by mouth at bedtime. 03/19/20  Yes Boscia, Heather E, NP  hydrochlorothiazide (HYDRODIURIL) 25 MG tablet Take 1 tablet (25 mg total) by mouth daily. 02/01/20  Yes [name], NP  Magnesium 250 MG TABS Take 250 mg by mouth daily at 6 (six) AM. 10/26/19  Yes [provider]  metFORMIN (GLUCOPHAGE-XR) 500 MG 24 hr tablet Take 2 tablets (1,000 mg total) by mouth daily. 04/18/20  Yes Boscia, Heather E, NP  montelukast (SINGULAIR) 10 MG tablet Take 10 mg by mouth at bedtime.   Yes [provider]  nystatin cream (MYCOSTATIN) Apply topically 2 (two) times daily. 07/18/19  Yes Boscia, Greer Ee, NP  oxybutynin (DITROPAN-XL) 10 MG 24 hr tablet Take 1 tablet (10 mg total) by mouth daily. 02/14/20  Yes Scarboro, Audie Clear, NP  sodium chloride 1 g tablet Take 1 tablet (1 g total) by mouth 3 (three) times daily with meals. 06/09/19  Yes Dustin Flock, MD  theophylline (THEO-24) 100 MG 24 hr capsule TAKE 1 CAPSULE BY MOUTH EVERY DAY  Patient taking differently: Take 100 mg by mouth daily. TAKE 1 CAPSULE BY MOUTH EVERY DAY 01/21/20  Yes Scarboro, Audie Clear, NP  albuterol (VENTOLIN HFA) 108 (90 Base) MCG/ACT inhaler Inhale 2 puffs into the lungs every 4 (four) hours as needed for wheezing or shortness of breath. 03/06/19   Boscia, Heather E, NP  glucose blood (ONETOUCH ULTRA) test strip 1 each by Other route daily. Use as instructed 07/26/19   [name], NP  Liniments (SALONPAS PAIN RELIEF PATCH EX) Apply topically.    [provider]  triamcinolone (KENALOG) 0.025 % cream Apply 1 application topically 2 (two) times daily. Ok to mix with topical lotion and apply to all affected areas. 05/07/19   [name], NP  famotidine (PEPCID) 20 MG tablet Take 1 tablet (20 mg total) by mouth 2 (two) times daily. 08/11/16 11/28/19  Epifanio Lesches, MD  pravastatin  (PRAVACHOL) 40 MG tablet Take 1 tablet (40 mg total) by mouth daily. 08/15/19 11/28/19  [name], NP    Physical Exam: Vitals:   04/19/20 1429 04/19/20 1615 04/19/20 1616 04/19/20 1800  BP: (!) 170/98  121/88 (!) 173/68  Pulse: 96 87 88 (!) 52  Resp: 18   18  Temp:    97.7 F (36.5 C)  TempSrc:    Oral  SpO2: 96% 97% 96% 97%  Weight:      Height:       General: Not in acute distress HEENT:       Eyes: PERRL, EOMI, no scleral icterus.       ENT: No discharge from the ears and nose, no pharynx injection, no tonsillar enlargement.        Neck: No JVD, no bruit, no mass felt. Heme: No neck lymph node enlargement. Cardiac: S1/S2, RRR, No murmurs, No gallops or rubs. Respiratory: No rales, wheezing, rhonchi or rubs. GI: Soft, nondistended, nontender, no rebound pain, no organomegaly, BS present. GU: No hematuria Ext: has trace leg edema bilaterally. 1+DP/PT pulse bilaterally. Musculoskeletal: has tenderness in right hip Skin: No rashes.  Neuro: Alert, oriented X3, cranial nerves II-XII grossly intact, moves all extremities normally. Psych: Patient is not psychotic, no suicidal or hemocidal ideation.  Labs on Admission: I have personally reviewed following labs and imaging studies  CBC: Recent Labs  Lab 04/19/20 1252  WBC 8.9  NEUTROABS 6.5  HGB 9.2*  HCT 31.6*  MCV 70.1*  PLT 725   Basic Metabolic Panel: Recent Labs  Lab 04/19/20 1252  NA 137  K 4.1  CL 101  CO2 27  GLUCOSE 151*  BUN 19  CREATININE 0.84  CALCIUM 9.3   GFR: Estimated Creatinine Clearance: 40 mL/min (by C-G formula based on SCr of 0.84 mg/dL). Liver Function Tests: No results for input(s): AST, ALT, ALKPHOS, BILITOT, PROT, ALBUMIN in the last 168 hours. No results for input(s): LIPASE, AMYLASE in the last 168 hours. No results for input(s): AMMONIA in the last 168 hours. Coagulation Profile: Recent Labs  Lab 04/19/20 1252  INR 1.2   Cardiac Enzymes: No results for input(s):  CKTOTAL, CKMB, CKMBINDEX, TROPONINI in the last 168 hours. BNP (last 3 results) No results for input(s): PROBNP in the last 8760 hours. HbA1C: No results for input(s): HGBA1C in the last 72 hours. CBG: No results for input(s): GLUCAP in the last 168 hours. Lipid Profile: No results for input(s): CHOL, HDL, LDLCALC, TRIG, CHOLHDL, LDLDIRECT in the last 72 hours. Thyroid Function Tests: No results for input(s): TSH, T4TOTAL, FREET4,  T3FREE, THYROIDAB in the last 72 hours. Anemia Panel: No results for input(s): VITAMINB12, FOLATE, FERRITIN, TIBC, IRON, RETICCTPCT in the last 72 hours. Urine analysis:    Component Value Date/Time   COLORURINE YELLOW (A) 06/08/2019 2110   APPEARANCEUR Cloudy (A) 08/21/2019 1350   LABSPEC 1.005 06/08/2019 2110   LABSPEC 1.012 03/04/2014 1535   PHURINE 5.0 06/08/2019 2110   GLUCOSEU Negative 08/21/2019 1350   GLUCOSEU 50 mg/dL 03/04/2014 1535   HGBUR NEGATIVE 06/08/2019 2110   BILIRUBINUR Negative 08/21/2019 1350   BILIRUBINUR Negative 03/04/2014 1535   KETONESUR NEGATIVE 06/08/2019 2110   PROTEINUR Negative 08/21/2019 1350   PROTEINUR NEGATIVE 06/08/2019 2110   UROBILINOGEN 0.2 12/27/2017 1520   NITRITE Negative 08/21/2019 1350   NITRITE NEGATIVE 06/08/2019 2110   LEUKOCYTESUR 3+ (A) 08/21/2019 1350   LEUKOCYTESUR SMALL (A) 06/08/2019 2110   LEUKOCYTESUR 3+ 03/04/2014 1535   Sepsis Labs: @LABRCNTIP (procalcitonin:4,lacticidven:4) ) Recent Results (from the past 240 hour(s))  SARS Coronavirus 2 by RT PCR (hospital order, performed in [place] hospital lab) Nasopharyngeal Nasopharyngeal Swab     Status: None   Collection Time: 04/19/20  2:35 PM   Specimen: Nasopharyngeal Swab  Result Value Ref Range Status   SARS Coronavirus 2 NEGATIVE NEGATIVE Final    Comment: (NOTE) SARS-CoV-2 target nucleic acids are NOT DETECTED. The SARS-CoV-2 RNA is generally detectable in upper and lower respiratory specimens during the acute phase of infection. The  lowest concentration of SARS-CoV-2 viral copies this assay can detect is 250 copies / mL. A negative result does not preclude SARS-CoV-2 infection and should not be used as the sole basis for treatment or other patient management decisions.  A negative result may occur with improper specimen collection / handling, submission of specimen other than nasopharyngeal swab, presence of viral mutation(s) within the areas targeted by this assay, and inadequate number of viral copies (<250 copies / mL). A negative result must be combined with clinical observations, patient history, and epidemiological information. Fact Sheet for Patients:   StrictlyIdeas.no Fact Sheet for Healthcare Providers: BankingDealers.co.za This test is not yet approved or cleared  by the [place] FDA and has been authorized for detection and/or diagnosis of SARS-CoV-2 by FDA under an Emergency Use Authorization (EUA).  This EUA will remain in effect (meaning this test can be used) for the duration of the COVID-19 declaration under Section 564(b)(1) of the Act, 21 U.S.C. section 360bbb-3(b)(1), unless the authorization is terminated or revoked sooner. Performed at [facility], [address], [place], [place] [address]      Radiological Exams on Admission: DG Chest 1 View  Result Date: 04/19/2020 CLINICAL DATA:  Fall, right hip fracture. EXAM: CHEST  1 VIEW COMPARISON:  06/04/2019 FINDINGS: Low lung volumes are present, causing crowding of the pulmonary vasculature. Chronic reticulonodular opacities at the left lung base with chronic blunting of the left lateral costophrenic angle, appearance likely reflects residuum of chronic infection. Mild cardiomegaly. Atherosclerotic calcification of the aortic arch. Fullness in the region of the main pulmonary artery could possibly free be from pulmonary pulmonary arterial hypertension or underlying lesion/adenopathy. Old  right lateral rib fractures. Thoracic spondylosis. IMPRESSION: 1. Chronic reticulonodular opacities at the left lung base, likely from residuum of chronic infection. Stable adjacent blunting of the left lateral costophrenic angle. 2. Fullness in the region of the main pulmonary artery could possibly be from pulmonary arterial hypertension or underlying lesion/adenopathy. 3. Low lung volumes. 4. Mild cardiomegaly. Electronically Signed   By: Van Clines  M.D.   On: 04/19/2020 13:10   CT Head Wo Contrast  Result Date: 04/19/2020 CLINICAL DATA:  Unwitnessed fall at independent living facility, neck pain at EXAM: CT HEAD WITHOUT CONTRAST CT CERVICAL SPINE WITHOUT CONTRAST TECHNIQUE: Multidetector CT imaging of the head and cervical spine was performed following the standard protocol without intravenous contrast. Multiplanar CT image reconstructions of the cervical spine were also generated. COMPARISON:  10/24/2019 FINDINGS: CT HEAD FINDINGS Brain: Generalized atrophy. Normal ventricular morphology. No midline shift or mass effect. Small vessel chronic ischemic changes of deep cerebral white matter. No intracranial hemorrhage, mass lesion, evidence of acute infarction, or extra-axial fluid collection. Vascular: Atherosclerotic calcification of internal carotid arteries at skull base. No hyperdense vessels. Skull: Intact Sinuses/Orbits: Clear Other: N/A CT CERVICAL SPINE FINDINGS Alignment: Normal Skull base and vertebrae: Mild osseous demineralization. Skull base intact. Multilevel facet degenerative changes. Scattered endplate spur formation as well as calcification of the anterior posterior longitudinal ligaments at several levels. Vertebral body heights maintained. No fracture, subluxation, or bone destruction. Soft tissues and spinal canal: Prevertebral soft tissues normal thickness. Atherosclerotic calcifications at the carotid bifurcations. Carotid bifurcations extend retropharyngeal bilaterally.  Additional atherosclerotic calcifications at the proximal great vessels. Disc levels: AP narrowing of spinal canal LEFT paracentral due to calcified posterior longitudinal ligament at C4 with question mild flattening of the ventral aspect of the spinal cord. Upper chest: Tips of lung apices clear Other: N/A IMPRESSION: Atrophy with small vessel chronic ischemic changes of deep cerebral white matter. No acute intracranial abnormalities. Degenerative disc and facet disease changes of the cervical spine. AP narrowing of spinal canal at C4 due to calcification of the posterior longitudinal ligament question slightly flattening ventral aspect of spinal cord. Extensive atherosclerotic calcifications in the carotid systems, including carotid bifurcations which are located retropharyngeal. Electronically Signed   By: Lavonia Dana M.D.   On: 04/19/2020 13:40   CT Cervical Spine Wo Contrast  Result Date: 04/19/2020 CLINICAL DATA:  Unwitnessed fall at independent living facility, neck pain at EXAM: CT HEAD WITHOUT CONTRAST CT CERVICAL SPINE WITHOUT CONTRAST TECHNIQUE: Multidetector CT imaging of the head and cervical spine was performed following the standard protocol without intravenous contrast. Multiplanar CT image reconstructions of the cervical spine were also generated. COMPARISON:  10/24/2019 FINDINGS: CT HEAD FINDINGS Brain: Generalized atrophy. Normal ventricular morphology. No midline shift or mass effect. Small vessel chronic ischemic changes of deep cerebral white matter. No intracranial hemorrhage, mass lesion, evidence of acute infarction, or extra-axial fluid collection. Vascular: Atherosclerotic calcification of internal carotid arteries at skull base. No hyperdense vessels. Skull: Intact Sinuses/Orbits: Clear Other: N/A CT CERVICAL SPINE FINDINGS Alignment: Normal Skull base and vertebrae: Mild osseous demineralization. Skull base intact. Multilevel facet degenerative changes. Scattered endplate spur  formation as well as calcification of the anterior posterior longitudinal ligaments at several levels. Vertebral body heights maintained. No fracture, subluxation, or bone destruction. Soft tissues and spinal canal: Prevertebral soft tissues normal thickness. Atherosclerotic calcifications at the carotid bifurcations. Carotid bifurcations extend retropharyngeal bilaterally. Additional atherosclerotic calcifications at the proximal great vessels. Disc levels: AP narrowing of spinal canal LEFT paracentral due to calcified posterior longitudinal ligament at C4 with question mild flattening of the ventral aspect of the spinal cord. Upper chest: Tips of lung apices clear Other: N/A IMPRESSION: Atrophy with small vessel chronic ischemic changes of deep cerebral white matter. No acute intracranial abnormalities. Degenerative disc and facet disease changes of the cervical spine. AP narrowing of spinal canal at C4 due to calcification of the  posterior longitudinal ligament question slightly flattening ventral aspect of spinal cord. Extensive atherosclerotic calcifications in the carotid systems, including carotid bifurcations which are located retropharyngeal. Electronically Signed   By: Lavonia Dana M.D.   On: 04/19/2020 13:40   DG Hip Unilat With Pelvis 2-3 Views Right  Addendum Date: 04/19/2020   ADDENDUM REPORT: 04/19/2020 13:57 ADDENDUM: The original report was by Dr. Van Clines. The following addendum is by Dr. Van Clines: This is a correction to previous impression # 1. Impression # 1 should read: 1. Intra-articular RIGHT femoral neck fracture with varus angulation. Discussed via Epic chat with Dr. Derrell Lolling at 1:55 p.m. on April 19, 2020. Electronically Signed   By: Van Clines M.D.   On: 04/19/2020 13:57   Result Date: 04/19/2020 CLINICAL DATA:  Fall, right hip pain EXAM: DG HIP (WITH OR WITHOUT PELVIS) 2-3V RIGHT COMPARISON:  06/03/2019 pelvic radiograph FINDINGS: Right femoral neck fracture  noted, intra-articular, with varus angulation. Primarily transcervical. Left hip hemiarthroplasty. Vascular calcifications noted. No additional fractures are identified. IMPRESSION: 1. Intra-articular left femoral neck fracture with varus angulation. 2. Left hip hemiarthroplasty. 3. Atherosclerosis. Electronically Signed: By: Van Clines M.D. On: 04/19/2020 13:07     EKG: Independently reviewed.  QTC 471, intermittent A. fib and sinus rhythm, PAC, low voltage, poor R wave progression  Assessment/Plan Principal Problem:   Fracture of femoral neck, right (HCC) Active Problems:   H/O deep venous thrombosis   Essential hypertension   Iron deficiency anemia due to chronic blood loss   Chronic obstructive pulmonary disease (HCC)   Atrial fibrillation, chronic (HCC)   Diabetes mellitus without complication ([place])   Fall at home, initial encounter   Fracture of femoral neck, right ([place]): As evidenced by x-ray. Patient has moderate pain now. No neurovascular compromise. Orthopedic surgeon, Dr. Roland Rack was consulted, planning to do surgery Monday since pt need to wean off Eliquis  - will admit to Med-surg bed - Pain control: morphine prn and percocet - When necessary Zofran for nausea - Robaxin for muscle spasm - type and cross - INR/PTT -PT/OT when able to (not ordered now)  H/O deep venous thrombosis -switch Eliquis to IV heparin  HTN:  -Continue home medications: Lotrel, HCTC,  -hydralazine prn  Iron deficiency anemia due to chronic blood loss: Hgb stable, 9.0 -->9.2 -f/u by CBC  Chronic obstructive pulmonary disease ([place]): stable -Continue theophylline, Singulair and bronchodilators  Atrial fibrillation, chronic ([place]): HR 87 -switch Eliquis to IV heparin  Diabetes mellitus without complication ([place]): Most recent A1c 6.5, controled. Patient is taking Metformin at home -SSI  Fall at home, initial encounter -pt/ot when able to  Leg edema: pt has trace leg edema.  She is  on as needed Lasix.  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 No presents with right femoral neck fracture. Pt dose not have hx of CAD and denies hx of CHF, but she is taking prn lasix for "leg fluid". No 2D echo on record.  Will get BNP and 2D echo for further evaluation before the surgery. Currently patient is active and partially dependent of her ADLs, IADLs. She is on Eliquis for A fib and DVT. She has trace leg edema, but bo chest pain, shortness of breath, palpitation, leg edema.  No signs of acute CHF exacerbation currently. His GUPTA score perioperative myocardial infarction or cardaic arrest is 1.93 which is at least moderate risk. I discussed the risk with patient and her daughter. They would like to proceed for surgery. Pending 2D echo.   DVT ppx: On IV Heparin     Code Status: Full code Family Communication: Yes, patient's daughter    at bed side Disposition Plan:  Anticipate discharge back to previous independent living facility  Consults called: Dr. Roland Rack of ortho Admission status: Med-surg bed as inpt          Status is: Inpatient Remains inpatient appropriate because:Inpatient level of care appropriate due to severity of illness Dispo: The patient is from: ALF              Anticipated d/c is to: ALF              Anticipated d/c date is: 2 days              Patient currently is not medically stable to d/c.           Date of Service 04/19/2020    [facility]   If 7PM-7AM, please contact night-coverage [email or web address] 04/19/2020, 7:09 PM

## 2020-04-19 NOTE — Progress Notes (Signed)
Patient beign admitted to room 155 falling a fall with left hip pain at her [place]. Patient is A&Ox4. On 2L oxygen via [place] with sats of 94%. Skin is intact. Sacral foam placed. Assigned RN will followup with pain. Admission assessment complete.

## 2020-04-19 NOTE — ED Triage Notes (Addendum)
Per EMT report, patient is a resident of [facility] and tripped over her bedspread while making her bed. Patient scooted out to living room to call for help. Patient denies LOC. Patient c/o pain over right hip which is shortened and externally rotated.patient is alert and oriented. Patient was given 162mcg of Fentanyl en route by EMT. Patient's pulse ox upon arrival is 90%. Patient placed on 2L O2 via [place].

## 2020-04-19 NOTE — ED Provider Notes (Signed)
[facility] Emergency Department Provider Note  ____________________________________________   First MD Initiated Contact with Patient 04/19/20 1205     (approximate)  I have reviewed the triage vital signs and the nursing notes.  History  Chief Complaint Fall    HPI Monica [name] is a 86 y.o. female past medical history as below, including atrial fibrillation on Eliquis, DM, HTN, HLD who presents to the emergency department from her independent living facility for a fall with resultant right hip pain and concern for fracture.  Patient states this morning she was making her bed, when she accidentally got tangled up and tripped over her bedspread.  She landed onto her right hip.  She does not think she hit her head.  Denies any LOC.  RLE is slightly shortened and externally rotated.  Received pain medication with EMS prior to arrival.  Pain is currently moderate in severity, located to the right hip, described as aching.  Worsened with movement, improved with rest.  No radiation.  Has a history of surgery to the left hip, no history of surgery or prior trauma to the right.  Last took her Eliquis this AM.   Past Medical Hx Past Medical History:  Diagnosis Date  . Atrial fibrillation ([place])   . Bronchitis   . Cancer ([place])    Left leg growth, kidneys, lungs and breasts  . Carcinoma of unknown primary ([place])   . COPD (chronic obstructive pulmonary disease) ([place])   . Diabetes mellitus without complication ([place])   . Hyperlipidemia   . Hypertension   . Pancreatitis   . Pneumonia   . Stroke ([place])    TIA's  . Vitamin B12 deficiency     Problem List Patient Active Problem List   Diagnosis Date Noted  . Supratherapeutic INR 11/22/2019  . Scalp hematoma, subsequent encounter 11/17/2019  . Scalp laceration, subsequent encounter 10/26/2019  . Muscle cramping 10/26/2019  . Long term current use of anticoagulant therapy 10/26/2019  . Encounter for general adult  medical examination with abnormal findings 08/21/2019  . Cellulitis of right buttock 07/25/2019  . Diabetic neuropathy ([place]) 06/25/2019  . Non-insulin dependent type 2 diabetes mellitus ([place]) 06/25/2019  . Pressure injury of skin 06/05/2019  . 'light-for-dates' infant with signs of fetal malnutrition 05/31/2019  . Closed left hip fracture ([place]) 05/31/2019  . Inflammatory polyarthritis ([place]) 05/09/2019  . Deep vein thrombosis (DVT) of non-extremity vein 11/29/2018  . Edema of left lower extremity 11/13/2018  . Cellulitis of left lower leg 11/01/2018  . Encounter for therapeutic drug level monitoring 07/05/2018  . Allergic contact dermatitis 04/19/2018  . Atopic dermatitis 03/15/2018  . 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 Atrial fibrillation, chronic ([place]) 02/12/2018  . Melena   . Urinary tract infection with hematuria 01/04/2018  . Chronic obstructive pulmonary disease ([place]) 12/27/2017  . Acute cystitis with hematuria 12/27/2017  . Dysuria 12/27/2017  . Urinary frequency 12/27/2017  . Uncontrolled type 2 diabetes mellitus with hyperglycemia ([place]) 12/27/2017  . Iron deficiency anemia due to chronic blood loss 09/28/2016  . Avitaminosis D 09/09/2016  . B12 deficiency 09/09/2016  . Temporary cerebral vascular dysfunction 09/09/2016  . Spinal stenosis 09/09/2016  . Pain in shoulder 09/09/2016  . Restless legs syndrome 09/09/2016  . Personal history of urinary calculi 09/09/2016  . H/O deep venous thrombosis 09/09/2016  . Gout 09/09/2016  . Accumulation of fluid in tissues 09/09/2016  . Carpal tunnel syndrome 09/09/2016  . Chronic lung disease 09/09/2016  .  Cataract 09/09/2016  . Appendicular ataxia 09/09/2016  . Airway hyperreactivity 09/09/2016  . Rectal bleeding   . Acute blood loss anemia   . Gastrointestinal hemorrhage 08/08/2016  . Carcinoma of unknown primary ([place]) 08/02/2016  . Diabetes ([place]) 05/08/2015  . Arthropathy of hand 02/04/2010  . Hypertonicity of bladder  08/31/2009  . Detrusor instability of bladder 06/18/2009  . Pure hypercholesterolemia 04/10/2009  . Essential hypertension 04/10/2009  . Diverticulitis of colon 04/10/2009    Past Surgical Hx Past Surgical History:  Procedure Laterality Date  . ABDOMINAL HYSTERECTOMY  1975  . BLADDER SUSPENSION  1989  . CAROTID ANGIOGRAPHY Right 11/23/2019   Procedure: CAROTID ANGIOGRAPHY;  Surgeon: Katha Cabal, MD;  Location: [facility] LAB;  Service: Cardiovascular;  Laterality: Right;  . COLONOSCOPY    . COLONOSCOPY WITH PROPOFOL N/A 01/25/2018   Procedure: COLONOSCOPY WITH PROPOFOL;  Surgeon: [name], MD;  Location: [facility] ENDOSCOPY;  Service: Endoscopy;  Laterality: N/A;  . ESOPHAGOGASTRODUODENOSCOPY (EGD) WITH PROPOFOL N/A 01/24/2018   Procedure: ESOPHAGOGASTRODUODENOSCOPY (EGD) WITH PROPOFOL;  Surgeon: [name], MD;  Location: [facility] ENDOSCOPY;  Service: Endoscopy;  Laterality: N/A;  . HIP ARTHROPLASTY Left 06/03/2019   Procedure: ARTHROPLASTY BIPOLAR HIP (HEMIARTHROPLASTY);  Surgeon: Dereck Leep, MD;  Location: [facility] ORS;  Service: Orthopedics;  Laterality: Left;    Medications Prior to Admission medications   Medication Sig Start Date End Date Taking? Authorizing Provider  amLODipine-benazepril (LOTREL) 5-20 MG capsule Take 1 capsule by mouth daily. 03/19/20  Yes [name], NP  b complex vitamins capsule Take 1 capsule by mouth daily.   Yes [provider]  desloratadine (CLARINEX) 5 MG tablet Take 1 tablet (5 mg total) by mouth daily. 03/19/20  Yes Boscia, Heather E, NP  ELIQUIS 2.5 MG TABS tablet TAKE ONE TABLET TWICE DAILY Patient taking differently: Take 2.5 mg by mouth 2 (two) times daily.  01/21/20  Yes Scarboro, Audie Clear, NP  fluticasone furoate-vilanterol (BREO ELLIPTA) 100-25 MCG/INH AEPB TAKE 1 PUFF ONCE A DAY 02/14/20  Yes Scarboro, Audie Clear, NP  furosemide (LASIX) 20 MG tablet Take 1 tablet (20 mg total) by mouth daily as needed for fluid or edema. 11/25/19   Yes Dhungel, Nishant, MD  gabapentin (NEURONTIN) 300 MG capsule Take 2 capsules (600 mg total) by mouth at bedtime. 03/19/20  Yes Boscia, Heather E, NP  hydrochlorothiazide (HYDRODIURIL) 25 MG tablet Take 1 tablet (25 mg total) by mouth daily. 02/01/20  Yes [name], NP  Magnesium 250 MG TABS Take 250 mg by mouth daily at 6 (six) AM. 10/26/19  Yes [provider]  metFORMIN (GLUCOPHAGE-XR) 500 MG 24 hr tablet Take 2 tablets (1,000 mg total) by mouth daily. 04/18/20  Yes Boscia, Heather E, NP  montelukast (SINGULAIR) 10 MG tablet Take 10 mg by mouth at bedtime.   Yes [provider]  nystatin cream (MYCOSTATIN) Apply topically 2 (two) times daily. 07/18/19  Yes Boscia, Greer Ee, NP  oxybutynin (DITROPAN-XL) 10 MG 24 hr tablet Take 1 tablet (10 mg total) by mouth daily. 02/14/20  Yes Scarboro, Audie Clear, NP  sodium chloride 1 g tablet Take 1 tablet (1 g total) by mouth 3 (three) times daily with meals. 06/09/19  Yes Dustin Flock, MD  theophylline (THEO-24) 100 MG 24 hr capsule TAKE 1 CAPSULE BY MOUTH EVERY DAY Patient taking differently: Take 100 mg by mouth daily. TAKE 1 CAPSULE BY MOUTH EVERY DAY 01/21/20  Yes Kendell Bane, NP  albuterol (VENTOLIN HFA) 108 (  90 Base) MCG/ACT inhaler Inhale 2 puffs into the lungs every 4 (four) hours as needed for wheezing or shortness of breath. 03/06/19   Boscia, Heather E, NP  glucose blood (ONETOUCH ULTRA) test strip 1 each by Other route daily. Use as instructed 07/26/19   [name], NP  Liniments (SALONPAS PAIN RELIEF PATCH EX) Apply topically.    [provider]  triamcinolone (KENALOG) 0.025 % cream Apply 1 application topically 2 (two) times daily. Ok to mix with topical lotion and apply to all affected areas. 05/07/19   [name], NP  famotidine (PEPCID) 20 MG tablet Take 1 tablet (20 mg total) by mouth 2 (two) times daily. 08/11/16 11/28/19  Epifanio Lesches, MD  pravastatin (PRAVACHOL) 40 MG tablet Take 1 tablet  (40 mg total) by mouth daily. 08/15/19 11/28/19  [name], NP    Allergies Sulfa antibiotics, Celecoxib, Acetaminophen, Codeine, Lyrica [pregabalin], Penicillin g, and Petrolatum-zinc oxide  Family Hx Family History  Problem Relation Age of Onset  . Cancer Father   . Diabetes Brother     Social Hx Social History   Tobacco Use  . Smoking status: Former Smoker    Packs/day: 0.50    Years: 15.00    Pack years: 7.50    Types: Cigarettes    Quit date: 11/15/1988    Years since quitting: 31.4  . Smokeless tobacco: Never Used  Substance Use Topics  . Alcohol use: No    Alcohol/week: 0.0 standard drinks  . Drug use: No     Review of Systems  Constitutional: Negative for fever. Negative for chills.  Positive for fall. Eyes: Negative for visual changes. ENT: Negative for sore throat. Cardiovascular: Negative for chest pain. Respiratory: Negative for shortness of breath. Gastrointestinal: Negative for nausea. Negative for vomiting.  Genitourinary: Negative for dysuria. Musculoskeletal: Positive for RIGHT hip pain. Skin: Negative for rash. Neurological: Negative for headaches.   Physical Exam  Vital Signs: ED Triage Vitals  Enc Vitals Group     BP 04/19/20 1154 (!) 160/83     Pulse Rate 04/19/20 1154 87     Resp 04/19/20 1154 18     Temp 04/19/20 1154 97.6 F (36.4 C)     Temp Source 04/19/20 1154 Oral     SpO2 --      Weight 04/19/20 1155 147 lb (66.7 kg)     Height 04/19/20 1155 5\' 3"  (1.6 m)     Head Circumference --      Peak Flow --      Pain Score 04/19/20 1155 7     Pain Loc --      Pain Edu? --      Excl. in [place]? --     Constitutional: Alert and oriented.  NAD.  Head: Normocephalic. Atraumatic. Eyes: Conjunctivae clear. Sclera anicteric. Pupils equal and symmetric. Nose: No masses or lesions. No congestion or rhinorrhea. Mouth/Throat: Wearing mask.  Neck: No stridor. Trachea midline.  No midline CS tenderness. Cardiovascular: Normal rate,  regular rhythm. Extremities well perfused.  2+ right DP pulse by palpation.  Toes are warm and well-perfused.  Cap refill less than 3 seconds. Respiratory: Normal respiratory effort.  Lungs CTAB.  Chest wall is stable and nontender.  No step-offs or crepitance. Gastrointestinal: Soft. Non-distended. Non-tender.  Genitourinary: Deferred. Musculoskeletal: RLE slightly shortened and held in external rotation.  ROM of the right hip deferred secondary to concern for likely underlying injury.  Otherwise, bilateral shoulders, elbows, wrists, left hip/knee/ankle demonstrate FROM  and NT.  Neurologic:  Normal speech and language. No gross focal or lateralizing neurologic deficits are appreciated.  Skin: Skin is warm, dry and intact. No rash noted.  No lacerations. Psychiatric: Mood and affect are appropriate for situation.  EKG  Personally reviewed and interpreted by myself.   Date: 04/19/2020 Time: 1325 Rate: 102 Rhythm: Sinus, with PACs Axis: Left Intervals: Within normal limits Sinus, with PACs No STEMI    Radiology  Personally reviewed available imaging myself.   XR RIGHT hip unilateral with pelvis reveals right femoral neck fracture.  CT head with no acute intracranial abnormalities.   Procedures  Procedure(s) performed (including critical care):  Procedures   Initial Impression / Assessment and Plan / MDM / ED Course  86 y.o. female who presents to the ED for non-syncopal, mechanical fall onto her RIGHT hip, concerning for fracture.  Further history and exam as above.  Will obtain basic screening labs, imaging.  Patient does not think that she hit her head, but as she is not definitive and she is on anticoagulation, will plan for CT imaging to rule out any intracranial bleed as a result.  Work-up reveals a right sided femoral neck fracture.  CT head without any acute abnormalities.  Updated patient and daughter at bedside on results, need for admission.  Discussed case with  orthopedics, and hospitalist for admission.  _______________________________   As part of my medical decision making I have reviewed available labs, radiology tests, reviewed old records/performed chart review, obtained additional history from family, and discussed with consultants (orthopedics, Dr. Roland Rack).     Final Clinical Impression(s) / ED Diagnosis  Final diagnoses:  Fall, initial encounter  Closed fracture of right hip, initial encounter [facility])       Note:  This document was prepared using Dragon voice recognition software and may include unintentional dictation errors.   Lilia Pro., MD 04/19/20 1416

## 2020-04-19 NOTE — ED Notes (Signed)
Linens were changed. Patient gave permission for gown from home to be cut off. Purewick is in place. Patient tolerated procedure well.

## 2020-04-19 NOTE — ED Notes (Signed)
Patient taken to xray.

## 2020-04-19 NOTE — Progress Notes (Addendum)
ANTICOAGULATION CONSULT NOTE - Initial Consult  Pharmacy Consult for Heparin  Indication: DVT  Allergies  Allergen Reactions  . Sulfa Antibiotics Swelling  . Celecoxib Nausea And Vomiting  . Acetaminophen Itching  . Codeine Rash  . Lyrica [Pregabalin] Rash  . Penicillin G Rash    Has patient had a PCN reaction causing immediate rash, facial/tongue/throat swelling, SOB or lightheadedness with hypotension: Yes Has patient had a PCN reaction causing severe rash involving mucus membranes or skin necrosis: No Has patient had a PCN reaction that required hospitalization: No Has patient had a PCN reaction occurring within the last 10 years: Unknown If all of the above answers are "NO", then may proceed with Cephalosporin use.  Marland Kitchen Petrolatum-Zinc Oxide Rash    Patient Measurements: Height: 5\' 3"  (160 cm) Weight: 66.7 kg (147 lb) IBW/kg (Calculated) : 52.4 Heparin Dosing Weight: 65.9 kg   Vital Signs: Temp: 97.6 F (36.4 C) (06/05 1154) Temp Source: Oral (06/05 1154) BP: 121/88 (06/05 1616) Pulse Rate: 88 (06/05 1616)  Labs: Recent Labs    04/19/20 1252 04/19/20 1631  HGB 9.2*  --   HCT 31.6*  --   PLT 327  --   APTT  --  27  LABPROT 14.6  --   INR 1.2  --   CREATININE 0.84  --     Estimated Creatinine Clearance: 40 mL/min (by C-G formula based on SCr of 0.84 mg/dL).   Medical History: Past Medical History:  Diagnosis Date  . Atrial fibrillation ([place])   . Bronchitis   . Cancer ([place])    Left leg growth, kidneys, lungs and breasts  . Carcinoma of unknown primary ([place])   . COPD (chronic obstructive pulmonary disease) ([place])   . Diabetes mellitus without complication ([place])   . Hyperlipidemia   . Hypertension   . Pancreatitis   . Pneumonia   . Stroke ([place])    TIA's  . Vitamin B12 deficiency     Medications:  Medications Prior to Admission  Medication Sig Dispense Refill Last Dose  . amLODipine-benazepril (LOTREL) 5-20 MG capsule Take 1 capsule by mouth daily.  90 capsule 1 04/19/2020 at 0800  . b complex vitamins capsule Take 1 capsule by mouth daily.   04/19/2020 at 0800  . desloratadine (CLARINEX) 5 MG tablet Take 1 tablet (5 mg total) by mouth daily. 30 tablet 3 04/19/2020 at 0800  . ELIQUIS 2.5 MG TABS tablet TAKE ONE TABLET TWICE DAILY (Patient taking differently: Take 2.5 mg by mouth 2 (two) times daily. ) 60 tablet 3 04/19/2020 at 0800  . fluticasone furoate-vilanterol (BREO ELLIPTA) 100-25 MCG/INH AEPB TAKE 1 PUFF ONCE A DAY 60 each 3 04/18/2020 at 1200  . furosemide (LASIX) 20 MG tablet Take 1 tablet (20 mg total) by mouth daily as needed for fluid or edema. 30 tablet 0 Past Week at prn  . gabapentin (NEURONTIN) 300 MG capsule Take 2 capsules (600 mg total) by mouth at bedtime. 60 capsule 5 04/18/2020 at 2000  . hydrochlorothiazide (HYDRODIURIL) 25 MG tablet Take 1 tablet (25 mg total) by mouth daily. 30 tablet 5 04/19/2020 at 0800  . Magnesium 250 MG TABS Take 250 mg by mouth daily at 6 (six) AM.   04/19/2020 at 0800  . metFORMIN (GLUCOPHAGE-XR) 500 MG 24 hr tablet Take 2 tablets (1,000 mg total) by mouth daily. 135 tablet 2 04/19/2020 at 0800  . montelukast (SINGULAIR) 10 MG tablet Take 10 mg by mouth at bedtime.   04/18/2020 at 2000  .  nystatin cream (MYCOSTATIN) Apply topically 2 (two) times daily. 30 g 0 Past Week at Unknown time  . oxybutynin (DITROPAN-XL) 10 MG 24 hr tablet Take 1 tablet (10 mg total) by mouth daily. 30 tablet 3 04/19/2020 at 0800  . sodium chloride 1 g tablet Take 1 tablet (1 g total) by mouth 3 (three) times daily with meals.   04/19/2020 at 0800  . theophylline (THEO-24) 100 MG 24 hr capsule TAKE 1 CAPSULE BY MOUTH EVERY DAY (Patient taking differently: Take 100 mg by mouth daily. TAKE 1 CAPSULE BY MOUTH EVERY DAY) 30 capsule 3 04/19/2020 at 0800  . albuterol (VENTOLIN HFA) 108 (90 Base) MCG/ACT inhaler Inhale 2 puffs into the lungs every 4 (four) hours as needed for wheezing or shortness of breath. 1 Inhaler 5 unknown at prn  . glucose blood  (ONETOUCH ULTRA) test strip 1 each by Other route daily. Use as instructed 100 each 3   . Liniments (SALONPAS PAIN RELIEF PATCH EX) Apply topically.   unknown at prn  . triamcinolone (KENALOG) 0.025 % cream Apply 1 application topically 2 (two) times daily. Ok to mix with topical lotion and apply to all affected areas. 80 g 3 unknown at prn    Assessment: Pharmacy consulted to dose heparin in this 86 year old female with DVT/AFib. Scheduled for surgical repair of fractured hip on 6/6.  Pt was on Eliquis 2.5 mg PO BID.   Last dose was on 6/5 @ 0800.  CrCl = 40 ml/min  Goal of Therapy:  Heparin level 0.3-0.7 units/ml aPTT 66 - 102  seconds Monitor platelets by anticoagulation protocol: Yes   Plan:  Will start Heparin @ 1100 units/hr @ 2000.  Will not bolus this pt. Will draw aPTT 8 hrs after start of drip. Will use aPTT to guide heparin dosing until HL and aPTT are both therapeutic.   Christerpher Clos D 04/19/2020,5:44 PM

## 2020-04-20 ENCOUNTER — Inpatient Hospital Stay (HOSPITAL_COMMUNITY)
Admit: 2020-04-20 | Discharge: 2020-04-20 | Disposition: A | Discharge status: Home / Self Care | Payer: Medicare Other | Attending: Internal Medicine | Admitting: Internal Medicine

## 2020-04-20 DIAGNOSIS — R600 Localized edema: Secondary | ICD-10-CM

## 2020-04-20 LAB — CBC
HCT: 33 % — ABNORMAL LOW (ref 36.0–46.0)
Hemoglobin: 9.7 g/dL — ABNORMAL LOW (ref 12.0–15.0)
MCH: 20.8 pg — ABNORMAL LOW (ref 26.0–34.0)
MCHC: 29.4 g/dL — ABNORMAL LOW (ref 30.0–36.0)
MCV: 70.8 fL — ABNORMAL LOW (ref 80.0–100.0)
Platelets: 310 10*3/uL (ref 150–400)
RBC: 4.66 MIL/uL (ref 3.87–5.11)
RDW: 19.5 % — ABNORMAL HIGH (ref 11.5–15.5)
WBC: 10.3 10*3/uL (ref 4.0–10.5)
nRBC: 0 % (ref 0.0–0.2)

## 2020-04-20 LAB — APTT
aPTT: 82 seconds — ABNORMAL HIGH (ref 24–36)
aPTT: 95 seconds — ABNORMAL HIGH (ref 24–36)

## 2020-04-20 LAB — FERRITIN: Ferritin: 9 ng/mL — ABNORMAL LOW (ref 11–307)

## 2020-04-20 LAB — RETICULOCYTES
Immature Retic Fract: 23.5 % — ABNORMAL HIGH (ref 2.3–15.9)
RBC.: 4.62 MIL/uL (ref 3.87–5.11)
Retic Count, Absolute: 61.9 10*3/uL (ref 19.0–186.0)
Retic Ct Pct: 1.3 % (ref 0.4–3.1)

## 2020-04-20 LAB — GLUCOSE, CAPILLARY
Glucose-Capillary: 132 mg/dL — ABNORMAL HIGH (ref 70–99)
Glucose-Capillary: 143 mg/dL — ABNORMAL HIGH (ref 70–99)
Glucose-Capillary: 171 mg/dL — ABNORMAL HIGH (ref 70–99)

## 2020-04-20 LAB — IRON AND TIBC
Iron: 23 ug/dL — ABNORMAL LOW (ref 28–170)
Saturation Ratios: 6 % — ABNORMAL LOW (ref 10.4–31.8)
TIBC: 414 ug/dL (ref 250–450)
UIBC: 391 ug/dL

## 2020-04-20 LAB — HEPARIN LEVEL (UNFRACTIONATED): Heparin Unfractionated: 2.18 IU/mL — ABNORMAL HIGH (ref 0.30–0.70)

## 2020-04-20 LAB — FOLATE: Folate: 9.8 ng/mL (ref >5.9)

## 2020-04-20 LAB — VITAMIN B12: Vitamin B-12: 277 pg/mL (ref 180–914)

## 2020-04-20 MED ORDER — ENSURE ENLIVE PO LIQD
237.0000 mL | Freq: Two times a day (BID) | ORAL | Status: DC
  Administered 2020-04-20 – 2020-04-24 (×4): 237 mL via ORAL

## 2020-04-20 MED ORDER — ALBUTEROL SULFATE (2.5 MG/3ML) 0.083% IN NEBU: 3.0000 mL | INHALATION_SOLUTION | RESPIRATORY_TRACT | Status: DC | PRN

## 2020-04-20 MED ORDER — ALBUTEROL SULFATE HFA 108 (90 BASE) MCG/ACT IN AERS
1.0000 | INHALATION_SPRAY | RESPIRATORY_TRACT | Status: DC | PRN
  Filled 2020-04-20: qty 6.7

## 2020-04-20 MED ORDER — ADULT MULTIVITAMIN W/MINERALS CH
1.0000 | ORAL_TABLET | Freq: Every day | ORAL | Status: DC
  Administered 2020-04-21 – 2020-04-24 (×4): 1 via ORAL
  Filled 2020-04-20 (×4): qty 1

## 2020-04-20 NOTE — Progress Notes (Signed)
ANTICOAGULATION CONSULT NOTE - Initial Consult  Pharmacy Consult for Heparin  Indication: DVT  Allergies  Allergen Reactions  . Sulfa Antibiotics Swelling  . Celecoxib Nausea And Vomiting  . Acetaminophen Itching  . Codeine Rash  . Lyrica [Pregabalin] Rash  . Penicillin G Rash    Has patient had a PCN reaction causing immediate rash, facial/tongue/throat swelling, SOB or lightheadedness with hypotension: Yes Has patient had a PCN reaction causing severe rash involving mucus membranes or skin necrosis: No Has patient had a PCN reaction that required hospitalization: No Has patient had a PCN reaction occurring within the last 10 years: Unknown If all of the above answers are "NO", then may proceed with Cephalosporin use.  Marland Kitchen Petrolatum-Zinc Oxide Rash    Patient Measurements: Height: 5\' 3"  (160 cm) Weight: 66.7 kg (147 lb) IBW/kg (Calculated) : 52.4 Heparin Dosing Weight: 65.9 kg   Vital Signs: Temp: 97.6 F (36.4 C) (06/06 0358) Temp Source: Oral (06/06 0358) BP: 110/48 (06/06 0358) Pulse Rate: 67 (06/06 0358)  Labs: Recent Labs    04/19/20 1252 04/19/20 1631 04/19/20 1820 04/20/20 0525  HGB 9.2*  --   --   --   HCT 31.6*  --   --   --   PLT 327  --   --   --   APTT  --  27 29 82*  LABPROT 14.6  --   --   --   INR 1.2  --   --   --   HEPARINUNFRC  --  2.78*  --  2.18*  CREATININE 0.84  --   --   --     Estimated Creatinine Clearance: 40 mL/min (by C-G formula based on SCr of 0.84 mg/dL).   Medical History: Past Medical History:  Diagnosis Date  . Atrial fibrillation ([place])   . Bronchitis   . Cancer ([place])    Left leg growth, kidneys, lungs and breasts  . Carcinoma of unknown primary ([place])   . COPD (chronic obstructive pulmonary disease) ([place])   . Diabetes mellitus without complication ([place])   . Hyperlipidemia   . Hypertension   . Pancreatitis   . Pneumonia   . Stroke ([place])    TIA's  . Vitamin B12 deficiency     Medications:  Medications Prior to  Admission  Medication Sig Dispense Refill Last Dose  . amLODipine-benazepril (LOTREL) 5-20 MG capsule Take 1 capsule by mouth daily. 90 capsule 1 04/19/2020 at 0800  . b complex vitamins capsule Take 1 capsule by mouth daily.   04/19/2020 at 0800  . desloratadine (CLARINEX) 5 MG tablet Take 1 tablet (5 mg total) by mouth daily. 30 tablet 3 04/19/2020 at 0800  . ELIQUIS 2.5 MG TABS tablet TAKE ONE TABLET TWICE DAILY (Patient taking differently: Take 2.5 mg by mouth 2 (two) times daily. ) 60 tablet 3 04/19/2020 at 0800  . fluticasone furoate-vilanterol (BREO ELLIPTA) 100-25 MCG/INH AEPB TAKE 1 PUFF ONCE A DAY 60 each 3 04/18/2020 at 1200  . furosemide (LASIX) 20 MG tablet Take 1 tablet (20 mg total) by mouth daily as needed for fluid or edema. 30 tablet 0 Past Week at prn  . gabapentin (NEURONTIN) 300 MG capsule Take 2 capsules (600 mg total) by mouth at bedtime. 60 capsule 5 04/18/2020 at 2000  . hydrochlorothiazide (HYDRODIURIL) 25 MG tablet Take 1 tablet (25 mg total) by mouth daily. 30 tablet 5 04/19/2020 at 0800  . Magnesium 250 MG TABS Take 250 mg by mouth daily at  6 (six) AM.   04/19/2020 at 0800  . metFORMIN (GLUCOPHAGE-XR) 500 MG 24 hr tablet Take 2 tablets (1,000 mg total) by mouth daily. 135 tablet 2 04/19/2020 at 0800  . montelukast (SINGULAIR) 10 MG tablet Take 10 mg by mouth at bedtime.   04/18/2020 at 2000  . nystatin cream (MYCOSTATIN) Apply topically 2 (two) times daily. 30 g 0 Past Week at Unknown time  . oxybutynin (DITROPAN-XL) 10 MG 24 hr tablet Take 1 tablet (10 mg total) by mouth daily. 30 tablet 3 04/19/2020 at 0800  . sodium chloride 1 g tablet Take 1 tablet (1 g total) by mouth 3 (three) times daily with meals.   04/19/2020 at 0800  . theophylline (THEO-24) 100 MG 24 hr capsule TAKE 1 CAPSULE BY MOUTH EVERY DAY (Patient taking differently: Take 100 mg by mouth daily. TAKE 1 CAPSULE BY MOUTH EVERY DAY) 30 capsule 3 04/19/2020 at 0800  . albuterol (VENTOLIN HFA) 108 (90 Base) MCG/ACT inhaler Inhale 2  puffs into the lungs every 4 (four) hours as needed for wheezing or shortness of breath. 1 Inhaler 5 unknown at prn  . glucose blood (ONETOUCH ULTRA) test strip 1 each by Other route daily. Use as instructed 100 each 3   . Liniments (SALONPAS PAIN RELIEF PATCH EX) Apply topically.   unknown at prn  . triamcinolone (KENALOG) 0.025 % cream Apply 1 application topically 2 (two) times daily. Ok to mix with topical lotion and apply to all affected areas. 80 g 3 unknown at prn    Assessment: Pharmacy consulted to dose heparin in this 86 year old female with DVT/AFib. Scheduled for surgical repair of fractured hip on 6/6.  Pt was on Eliquis 2.5 mg PO BID.   Last dose was on 6/5 @ 0800.  CrCl = 40 ml/min  Goal of Therapy:  Heparin level 0.3-0.7 units/ml aPTT 66 - 102  seconds Monitor platelets by anticoagulation protocol: Yes   Plan:  06/06 @ 0530 aPTT 82 seconds therapeutic. HL pending, won't wait for it to continue rate and recheck aPTT at 1500, will continue to monitor.  Tobie Lords, PharmD, BCPS Clinical Pharmacist 04/20/2020,7:03 AM

## 2020-04-20 NOTE — Consult Note (Signed)
ORTHOPAEDIC CONSULTATION  REQUESTING PHYSICIAN: Lorella Nimrod, MD  Chief Complaint:   Right hip pain.  History of Present Illness: Monica [name] is a 86 y.o. female with multiple medical issues, including chronic atrial fibrillation for which she takes Eliquis, COPD, diabetes, hyperlipidemia, hypertension, and status post a TIA who lives in an apartment in a retirement community.  The patient normally ambulates with a walker.  Apparently she was trying to make her bed yesterday when she lost her balance and fell, injuring her right hip.  She was brought to the emergency room where x-rays demonstrated a displaced right femoral neck fracture.  The patient denies any associated injuries.  She did not strike her head or lose consciousness.  She also denies any lightheadedness, dizziness, chest pain, shortness of breath, or other symptoms which may have precipitated her fall.  Past Medical History:  Diagnosis Date  . Atrial fibrillation ([place])   . Bronchitis   . Cancer ([place])    Left leg growth, kidneys, lungs and breasts  . Carcinoma of unknown primary ([place])   . COPD (chronic obstructive pulmonary disease) ([place])   . Diabetes mellitus without complication ([place])   . Hyperlipidemia   . Hypertension   . Pancreatitis   . Pneumonia   . Stroke ([place])    TIA's  . Vitamin B12 deficiency    Past Surgical History:  Procedure Laterality Date  . ABDOMINAL HYSTERECTOMY  1975  . BLADDER SUSPENSION  1989  . CAROTID ANGIOGRAPHY Right 11/23/2019   Procedure: CAROTID ANGIOGRAPHY;  Surgeon: Katha Cabal, MD;  Location: [place] [facility];  Service: Cardiovascular;  Laterality: Right;  . COLONOSCOPY    . COLONOSCOPY WITH PROPOFOL N/A 01/25/2018   Procedure: COLONOSCOPY WITH PROPOFOL;  Surgeon: [name], MD;  Location: [facility] ENDOSCOPY;  Service: Endoscopy;  Laterality: N/A;  . ESOPHAGOGASTRODUODENOSCOPY (EGD) WITH PROPOFOL N/A 01/24/2018    Procedure: ESOPHAGOGASTRODUODENOSCOPY (EGD) WITH PROPOFOL;  Surgeon: [name], MD;  Location: [facility] ENDOSCOPY;  Service: Endoscopy;  Laterality: N/A;  . HIP ARTHROPLASTY Left 06/03/2019   Procedure: ARTHROPLASTY BIPOLAR HIP (HEMIARTHROPLASTY);  Surgeon: Dereck Leep, MD;  Location: [facility] ORS;  Service: Orthopedics;  Laterality: Left;   Social History   Socioeconomic History  . Marital status: Divorced    Spouse name: Not on file  . Number of children: Not on file  . Years of education: Not on file  . Highest education level: Not on file  Occupational History  . Not on file  Tobacco Use  . Smoking status: Former Smoker    Packs/day: 0.50    Years: 15.00    Pack years: 7.50    Types: Cigarettes    Quit date: 11/15/1988    Years since quitting: 31.4  . Smokeless tobacco: Never Used  Substance and Sexual Activity  . Alcohol use: No    Alcohol/week: 0.0 standard drinks  . Drug use: No  . Sexual activity: Not on file    Comment: hysterectomy   Other Topics Concern  . Not on file  Social History Narrative  . Not on file   Social Determinants of Health   Financial Resource Strain:   . Difficulty of Paying Living Expenses:   Food Insecurity:   . Worried About Charity fundraiser in the Last Year:   . Arboriculturist in the Last Year:   Transportation Needs:   . Film/video editor (Medical):   Marland Kitchen Lack of Transportation (Non-Medical):   Physical Activity:   .  Days of Exercise per Week:   . Minutes of Exercise per Session:   Stress:   . Feeling of Stress :   Social Connections:   . Frequency of Communication with Friends and Family:   . Frequency of Social Gatherings with Friends and Family:   . Attends Religious Services:   . Active Member of Clubs or Organizations:   . Attends Archivist Meetings:   Marland Kitchen Marital Status:    Family History  Problem Relation Age of Onset  . Cancer Father   . Diabetes Brother    Allergies  Allergen Reactions  . Sulfa  Antibiotics Swelling  . Celecoxib Nausea And Vomiting  . Acetaminophen Itching  . Codeine Rash  . Lyrica [Pregabalin] Rash  . Penicillin G Rash    Has patient had a PCN reaction causing immediate rash, facial/tongue/throat swelling, SOB or lightheadedness with hypotension: Yes Has patient had a PCN reaction causing severe rash involving mucus membranes or skin necrosis: No Has patient had a PCN reaction that required hospitalization: No Has patient had a PCN reaction occurring within the last 10 years: Unknown If all of the above answers are "NO", then may proceed with Cephalosporin use.  Marland Kitchen Petrolatum-Zinc Oxide Rash   Prior to Admission medications   Medication Sig Start Date End Date Taking? Authorizing Provider  amLODipine-benazepril (LOTREL) 5-20 MG capsule Take 1 capsule by mouth daily. 03/19/20  Yes [name], NP  b complex vitamins capsule Take 1 capsule by mouth daily.   Yes [provider]  desloratadine (CLARINEX) 5 MG tablet Take 1 tablet (5 mg total) by mouth daily. 03/19/20  Yes Boscia, Heather E, NP  ELIQUIS 2.5 MG TABS tablet TAKE ONE TABLET TWICE DAILY Patient taking differently: Take 2.5 mg by mouth 2 (two) times daily.  01/21/20  Yes Scarboro, Audie Clear, NP  fluticasone furoate-vilanterol (BREO ELLIPTA) 100-25 MCG/INH AEPB TAKE 1 PUFF ONCE A DAY 02/14/20  Yes Scarboro, Audie Clear, NP  furosemide (LASIX) 20 MG tablet Take 1 tablet (20 mg total) by mouth daily as needed for fluid or edema. 11/25/19  Yes Dhungel, Nishant, MD  gabapentin (NEURONTIN) 300 MG capsule Take 2 capsules (600 mg total) by mouth at bedtime. 03/19/20  Yes Boscia, Heather E, NP  hydrochlorothiazide (HYDRODIURIL) 25 MG tablet Take 1 tablet (25 mg total) by mouth daily. 02/01/20  Yes [name], NP  Magnesium 250 MG TABS Take 250 mg by mouth daily at 6 (six) AM. 10/26/19  Yes [provider]  metFORMIN (GLUCOPHAGE-XR) 500 MG 24 hr tablet Take 2 tablets (1,000 mg total) by mouth daily. 04/18/20   Yes Boscia, Heather E, NP  montelukast (SINGULAIR) 10 MG tablet Take 10 mg by mouth at bedtime.   Yes [provider]  nystatin cream (MYCOSTATIN) Apply topically 2 (two) times daily. 07/18/19  Yes Boscia, Greer Ee, NP  oxybutynin (DITROPAN-XL) 10 MG 24 hr tablet Take 1 tablet (10 mg total) by mouth daily. 02/14/20  Yes Scarboro, Audie Clear, NP  sodium chloride 1 g tablet Take 1 tablet (1 g total) by mouth 3 (three) times daily with meals. 06/09/19  Yes Dustin Flock, MD  theophylline (THEO-24) 100 MG 24 hr capsule TAKE 1 CAPSULE BY MOUTH EVERY DAY Patient taking differently: Take 100 mg by mouth daily. TAKE 1 CAPSULE BY MOUTH EVERY DAY 01/21/20  Yes Scarboro, Audie Clear, NP  albuterol (VENTOLIN HFA) 108 (90 Base) MCG/ACT inhaler Inhale 2 puffs into the lungs every 4 (four) hours as  needed for wheezing or shortness of breath. 03/06/19   Leretha Pol E, NP  glucose blood (ONETOUCH ULTRA) test strip 1 each by Other route daily. Use as instructed 07/26/19   [name], NP  Liniments (SALONPAS PAIN RELIEF PATCH EX) Apply topically.    [provider]  triamcinolone (KENALOG) 0.025 % cream Apply 1 application topically 2 (two) times daily. Ok to mix with topical lotion and apply to all affected areas. 05/07/19   [name], NP  famotidine (PEPCID) 20 MG tablet Take 1 tablet (20 mg total) by mouth 2 (two) times daily. 08/11/16 11/28/19  Epifanio Lesches, MD  pravastatin (PRAVACHOL) 40 MG tablet Take 1 tablet (40 mg total) by mouth daily. 08/15/19 11/28/19  [name], NP   DG Chest 1 View  Result Date: 04/19/2020 CLINICAL DATA:  Fall, right hip fracture. EXAM: CHEST  1 VIEW COMPARISON:  06/04/2019 FINDINGS: Low lung volumes are present, causing crowding of the pulmonary vasculature. Chronic reticulonodular opacities at the left lung base with chronic blunting of the left lateral costophrenic angle, appearance likely reflects residuum of chronic infection. Mild cardiomegaly.  Atherosclerotic calcification of the aortic arch. Fullness in the region of the main pulmonary artery could possibly free be from pulmonary pulmonary arterial hypertension or underlying lesion/adenopathy. Old right lateral rib fractures. Thoracic spondylosis. IMPRESSION: 1. Chronic reticulonodular opacities at the left lung base, likely from residuum of chronic infection. Stable adjacent blunting of the left lateral costophrenic angle. 2. Fullness in the region of the main pulmonary artery could possibly be from pulmonary arterial hypertension or underlying lesion/adenopathy. 3. Low lung volumes. 4. Mild cardiomegaly. Electronically Signed   By: Van Clines M.D.   On: 04/19/2020 13:10   CT Head Wo Contrast  Result Date: 04/19/2020 CLINICAL DATA:  Unwitnessed fall at independent living facility, neck pain at EXAM: CT HEAD WITHOUT CONTRAST CT CERVICAL SPINE WITHOUT CONTRAST TECHNIQUE: Multidetector CT imaging of the head and cervical spine was performed following the standard protocol without intravenous contrast. Multiplanar CT image reconstructions of the cervical spine were also generated. COMPARISON:  10/24/2019 FINDINGS: CT HEAD FINDINGS Brain: Generalized atrophy. Normal ventricular morphology. No midline shift or mass effect. Small vessel chronic ischemic changes of deep cerebral white matter. No intracranial hemorrhage, mass lesion, evidence of acute infarction, or extra-axial fluid collection. Vascular: Atherosclerotic calcification of internal carotid arteries at skull base. No hyperdense vessels. Skull: Intact Sinuses/Orbits: Clear Other: N/A CT CERVICAL SPINE FINDINGS Alignment: Normal Skull base and vertebrae: Mild osseous demineralization. Skull base intact. Multilevel facet degenerative changes. Scattered endplate spur formation as well as calcification of the anterior posterior longitudinal ligaments at several levels. Vertebral body heights maintained. No fracture, subluxation, or bone  destruction. Soft tissues and spinal canal: Prevertebral soft tissues normal thickness. Atherosclerotic calcifications at the carotid bifurcations. Carotid bifurcations extend retropharyngeal bilaterally. Additional atherosclerotic calcifications at the proximal great vessels. Disc levels: AP narrowing of spinal canal LEFT paracentral due to calcified posterior longitudinal ligament at C4 with question mild flattening of the ventral aspect of the spinal cord. Upper chest: Tips of lung apices clear Other: N/A IMPRESSION: Atrophy with small vessel chronic ischemic changes of deep cerebral white matter. No acute intracranial abnormalities. Degenerative disc and facet disease changes of the cervical spine. AP narrowing of spinal canal at C4 due to calcification of the posterior longitudinal ligament question slightly flattening ventral aspect of spinal cord. Extensive atherosclerotic calcifications in the carotid systems, including carotid bifurcations which are located retropharyngeal. Electronically Signed  By: Lavonia Dana M.D.   On: 04/19/2020 13:40   CT Cervical Spine Wo Contrast  Result Date: 04/19/2020 CLINICAL DATA:  Unwitnessed fall at independent living facility, neck pain at EXAM: CT HEAD WITHOUT CONTRAST CT CERVICAL SPINE WITHOUT CONTRAST TECHNIQUE: Multidetector CT imaging of the head and cervical spine was performed following the standard protocol without intravenous contrast. Multiplanar CT image reconstructions of the cervical spine were also generated. COMPARISON:  10/24/2019 FINDINGS: CT HEAD FINDINGS Brain: Generalized atrophy. Normal ventricular morphology. No midline shift or mass effect. Small vessel chronic ischemic changes of deep cerebral white matter. No intracranial hemorrhage, mass lesion, evidence of acute infarction, or extra-axial fluid collection. Vascular: Atherosclerotic calcification of internal carotid arteries at skull base. No hyperdense vessels. Skull: Intact Sinuses/Orbits:  Clear Other: N/A CT CERVICAL SPINE FINDINGS Alignment: Normal Skull base and vertebrae: Mild osseous demineralization. Skull base intact. Multilevel facet degenerative changes. Scattered endplate spur formation as well as calcification of the anterior posterior longitudinal ligaments at several levels. Vertebral body heights maintained. No fracture, subluxation, or bone destruction. Soft tissues and spinal canal: Prevertebral soft tissues normal thickness. Atherosclerotic calcifications at the carotid bifurcations. Carotid bifurcations extend retropharyngeal bilaterally. Additional atherosclerotic calcifications at the proximal great vessels. Disc levels: AP narrowing of spinal canal LEFT paracentral due to calcified posterior longitudinal ligament at C4 with question mild flattening of the ventral aspect of the spinal cord. Upper chest: Tips of lung apices clear Other: N/A IMPRESSION: Atrophy with small vessel chronic ischemic changes of deep cerebral white matter. No acute intracranial abnormalities. Degenerative disc and facet disease changes of the cervical spine. AP narrowing of spinal canal at C4 due to calcification of the posterior longitudinal ligament question slightly flattening ventral aspect of spinal cord. Extensive atherosclerotic calcifications in the carotid systems, including carotid bifurcations which are located retropharyngeal. Electronically Signed   By: Lavonia Dana M.D.   On: 04/19/2020 13:40   DG Hip Unilat With Pelvis 2-3 Views Right  Addendum Date: 04/19/2020   ADDENDUM REPORT: 04/19/2020 13:57 ADDENDUM: The original report was by Dr. Van Clines. The following addendum is by Dr. Van Clines: This is a correction to previous impression # 1. Impression # 1 should read: 1. Intra-articular RIGHT femoral neck fracture with varus angulation. Discussed via Epic chat with Dr. Derrell Lolling at 1:55 p.m. on April 19, 2020. Electronically Signed   By: Van Clines M.D.   On:  04/19/2020 13:57   Result Date: 04/19/2020 CLINICAL DATA:  Fall, right hip pain EXAM: DG HIP (WITH OR WITHOUT PELVIS) 2-3V RIGHT COMPARISON:  06/03/2019 pelvic radiograph FINDINGS: Right femoral neck fracture noted, intra-articular, with varus angulation. Primarily transcervical. Left hip hemiarthroplasty. Vascular calcifications noted. No additional fractures are identified. IMPRESSION: 1. Intra-articular left femoral neck fracture with varus angulation. 2. Left hip hemiarthroplasty. 3. Atherosclerosis. Electronically Signed: By: Van Clines M.D. On: 04/19/2020 13:07    Positive ROS: All other systems have been reviewed and were otherwise negative with the exception of those mentioned in the HPI and as above.  Physical Exam: General:  Alert, no acute distress Psychiatric:  Patient is competent for consent with normal mood and affect   Cardiovascular:  No pedal edema Respiratory:  No wheezing, non-labored breathing GI:  Abdomen is soft and non-tender Skin:  No lesions in the area of chief complaint Neurologic:  Sensation intact distally Lymphatic:  No axillary or cervical lymphadenopathy  Orthopedic Exam:  Orthopedic examination is limited to the right hip and lower extremity.  The right  lower extremity is somewhat shortened and externally rotated as compared to the left.  Skin inspection around the right hip is unremarkable.  No swelling, erythema, ecchymosis, abrasions, or other skin abnormalities are identified.  She has only mild tenderness to palpation over the lateral aspect of the right hip.  She has more severe pain with any attempted active or passive motion of the hip.  She is able to dorsiflex and plantarflex her toes and ankle.  Sensation is intact to light touch to all distributions.  She has good capillary refill to her right foot.  X-rays:  X-rays of the pelvis and right hip are available for review and have been reviewed by myself.  These films demonstrate a varus displaced  right femoral neck fracture.  No significant degenerative changes are identified.  No lytic lesions or other acute bony abnormalities are noted.  Assessment: Displaced right femoral neck fracture.  Plan: The treatment options have been discussed with the patient and her daughters, who are at the bedside, including both surgical and nonsurgical choices.  The patient would like to proceed with surgical intervention to include a right hip hemiarthroplasty.  This procedure has been discussed in detail with the patient and her daughters, as have the potential risks (including bleeding, infection, nerve and/or blood vessel injury, persistent or recurrent pain, stiffness, loosening of and/or failure of the components, leg length inequality, dislocation, etc.) and benefits.  The patient and her daughters state their understanding and wish to proceed.  A formal written consent will be obtained by the nursing staff.  Thank you for asking me to participate in the care of this most delightful woman.  We will plan on doing the operation tomorrow afternoon which will be 2 days after her last dose of Eliquis.  I will be happy to follow her with you.   Pascal Lux, MD  Beeper #:  [phone]  04/20/2020 2:45 PM

## 2020-04-20 NOTE — Progress Notes (Signed)
Initial Nutrition Assessment  DOCUMENTATION CODES:   Not applicable  INTERVENTION:   Ensure Enlive po BID, each supplement provides 350 kcal and 20 grams of protein  MVI daily   Liberalize diet   Recommend check iron, ferritin, transferrin, TIBC, folate and B12 labs  NUTRITION DIAGNOSIS:   Increased nutrient needs related to hip fracture as evidenced by increased estimated needs.  GOAL:   Patient will meet greater than or equal to 90% of their needs  MONITOR:   PO intake, Supplement acceptance, Labs, Weight trends, Skin, I & O's  REASON FOR ASSESSMENT:   Consult Hip fracture protocol  ASSESSMENT:   86 y.o. 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  Unable to reach pt by phone. Suspect pt with good appetite and oral intake at baseline. RD will add supplements and MVI to help pt meet her estimated needs. RD will also liberalize the heart healthy portion of pts diet as this is restrictive of protein. Of note, pt with microcytic anemia and h/o iron deficiency, would recommend check iron/anemia labs. Per chart, pt down 12lbs(7%) in 8 months; this is not significant.   Medications reviewed and include: insulin, Mg oxide, rena-vite, NaCl  Labs reviewed: Hgb 9.2(L), Hct 31.6(L), MCV 70.1(L), MCH 20.4(L), MCHC 29.1(L) cbgs- 189, 132 x 24 hrs AIC 6.5(H)- 4/13  NUTRITION - FOCUSED PHYSICAL EXAM: Unable to perform at this time   Diet Order:   Diet Order            Diet heart healthy/carb modified Room service appropriate? Yes; Fluid consistency: Thin  Diet effective now             EDUCATION NEEDS:   Not appropriate for education at this time  Skin:  Skin Assessment: Reviewed RN Assessment  Last BM:  6/5  Height:   Ht Readings from  Last 1 Encounters:  04/19/20 5\' 3"  (1.6 m)    Weight:   Wt Readings from Last 1 Encounters:  04/19/20 66.7 kg    Ideal Body Weight:  52.3 kg  BMI:  Body mass index is 26.04 kg/m.  Estimated Nutritional Needs:   Kcal:  1400-1600kcal/day  Protein:  70-80g/day  Fluid:  >1.4L/day  Koleen Distance MS, RD, LDN Please refer to [facility] for RD and/or RD on-call/weekend/after hours pager

## 2020-04-20 NOTE — Progress Notes (Signed)
ANTICOAGULATION CONSULT NOTE - Initial Consult  Pharmacy Consult for Heparin  Indication: DVT  Allergies  Allergen Reactions  . Sulfa Antibiotics Swelling  . Celecoxib Nausea And Vomiting  . Acetaminophen Itching  . Codeine Rash  . Lyrica [Pregabalin] Rash  . Penicillin G Rash    Has patient had a PCN reaction causing immediate rash, facial/tongue/throat swelling, SOB or lightheadedness with hypotension: Yes Has patient had a PCN reaction causing severe rash involving mucus membranes or skin necrosis: No Has patient had a PCN reaction that required hospitalization: No Has patient had a PCN reaction occurring within the last 10 years: Unknown If all of the above answers are "NO", then may proceed with Cephalosporin use.  Marland Kitchen Petrolatum-Zinc Oxide Rash    Patient Measurements: Height: 5\' 3"  (160 cm) Weight: 66.7 kg (147 lb) IBW/kg (Calculated) : 52.4 Heparin Dosing Weight: 65.9 kg   Vital Signs: Temp: 97.7 F (36.5 C) (06/06 0830) Temp Source: Oral (06/06 0830) BP: 110/47 (06/06 0830) Pulse Rate: 64 (06/06 0830)  Labs: Recent Labs    04/19/20 1252 04/19/20 1631 04/19/20 1631 04/19/20 1820 04/20/20 0525 04/20/20 1357  HGB 9.2*  --   --   --   --  9.7*  HCT 31.6*  --   --   --   --  33.0*  PLT 327  --   --   --   --  310  APTT  --  27   < > 29 82* 95*  LABPROT 14.6  --   --   --   --   --   INR 1.2  --   --   --   --   --   HEPARINUNFRC  --  2.78*  --   --  2.18*  --   CREATININE 0.84  --   --   --   --   --    < > = values in this interval not displayed.    Estimated Creatinine Clearance: 40 mL/min (by C-G formula based on SCr of 0.84 mg/dL).   Medical History: Past Medical History:  Diagnosis Date  . Atrial fibrillation ([place])   . Bronchitis   . Cancer ([place])    Left leg growth, kidneys, lungs and breasts  . Carcinoma of unknown primary ([place])   . COPD (chronic obstructive pulmonary disease) ([place])   . Diabetes mellitus without complication ([place])   .  Hyperlipidemia   . Hypertension   . Pancreatitis   . Pneumonia   . Stroke ([place])    TIA's  . Vitamin B12 deficiency     Medications:  Medications Prior to Admission  Medication Sig Dispense Refill Last Dose  . amLODipine-benazepril (LOTREL) 5-20 MG capsule Take 1 capsule by mouth daily. 90 capsule 1 04/19/2020 at 0800  . b complex vitamins capsule Take 1 capsule by mouth daily.   04/19/2020 at 0800  . desloratadine (CLARINEX) 5 MG tablet Take 1 tablet (5 mg total) by mouth daily. 30 tablet 3 04/19/2020 at 0800  . ELIQUIS 2.5 MG TABS tablet TAKE ONE TABLET TWICE DAILY (Patient taking differently: Take 2.5 mg by mouth 2 (two) times daily. ) 60 tablet 3 04/19/2020 at 0800  . fluticasone furoate-vilanterol (BREO ELLIPTA) 100-25 MCG/INH AEPB TAKE 1 PUFF ONCE A DAY 60 each 3 04/18/2020 at 1200  . furosemide (LASIX) 20 MG tablet Take 1 tablet (20 mg total) by mouth daily as needed for fluid or edema. 30 tablet 0 Past Week at prn  .  gabapentin (NEURONTIN) 300 MG capsule Take 2 capsules (600 mg total) by mouth at bedtime. 60 capsule 5 04/18/2020 at 2000  . hydrochlorothiazide (HYDRODIURIL) 25 MG tablet Take 1 tablet (25 mg total) by mouth daily. 30 tablet 5 04/19/2020 at 0800  . Magnesium 250 MG TABS Take 250 mg by mouth daily at 6 (six) AM.   04/19/2020 at 0800  . metFORMIN (GLUCOPHAGE-XR) 500 MG 24 hr tablet Take 2 tablets (1,000 mg total) by mouth daily. 135 tablet 2 04/19/2020 at 0800  . montelukast (SINGULAIR) 10 MG tablet Take 10 mg by mouth at bedtime.   04/18/2020 at 2000  . nystatin cream (MYCOSTATIN) Apply topically 2 (two) times daily. 30 g 0 Past Week at Unknown time  . oxybutynin (DITROPAN-XL) 10 MG 24 hr tablet Take 1 tablet (10 mg total) by mouth daily. 30 tablet 3 04/19/2020 at 0800  . sodium chloride 1 g tablet Take 1 tablet (1 g total) by mouth 3 (three) times daily with meals.   04/19/2020 at 0800  . theophylline (THEO-24) 100 MG 24 hr capsule TAKE 1 CAPSULE BY MOUTH EVERY DAY (Patient taking differently:  Take 100 mg by mouth daily. TAKE 1 CAPSULE BY MOUTH EVERY DAY) 30 capsule 3 04/19/2020 at 0800  . albuterol (VENTOLIN HFA) 108 (90 Base) MCG/ACT inhaler Inhale 2 puffs into the lungs every 4 (four) hours as needed for wheezing or shortness of breath. 1 Inhaler 5 unknown at prn  . glucose blood (ONETOUCH ULTRA) test strip 1 each by Other route daily. Use as instructed 100 each 3   . Liniments (SALONPAS PAIN RELIEF PATCH EX) Apply topically.   unknown at prn  . triamcinolone (KENALOG) 0.025 % cream Apply 1 application topically 2 (two) times daily. Ok to mix with topical lotion and apply to all affected areas. 80 g 3 unknown at prn    Assessment: Pharmacy consulted to dose heparin in this 86 year old female with DVT/AFib. Scheduled for surgical repair of fractured hip on 6/6.  Pt was on Eliquis 2.5 mg PO BID.   Last dose was on 6/5 @ 0800.  CrCl = 40 ml/min  06/06 @ 0530 aPTT 82 seconds therapeutic. 06/06 @ 1357 aPTT 95  Goal of Therapy:  Heparin level 0.3-0.7 units/ml aPTT 66 - 102  seconds Monitor platelets by anticoagulation protocol: Yes   Plan:  06/06 @ 1357 aPTT 95. Level is therapeutic x 2. Since level is at the high end of therapeutic will reduce rate slightly to 1050 units/hr.  Will order HL/aPTT and CBC with AM labs.   Pernell Dupre, PharmD, BCPS Clinical Pharmacist 04/20/2020 2:58 PM

## 2020-04-20 NOTE — Plan of Care (Signed)
  Problem: Education: Goal: Knowledge of General Education information will improve Description: Including pain rating scale, medication(s)/side effects and non-pharmacologic comfort measures 04/20/2020 0524 by Rollen Sox, RN Outcome: Progressing 04/20/2020 0524 by Rollen Sox, RN Outcome: Progressing   Problem: Clinical Measurements: Goal: Ability to maintain clinical measurements within normal limits will improve 04/20/2020 0524 by Rollen Sox, RN Outcome: Progressing 04/20/2020 0524 by Rollen Sox, RN Outcome: Progressing Goal: Will remain free from infection 04/20/2020 0524 by Rollen Sox, RN Outcome: Progressing 04/20/2020 0524 by Rollen Sox, RN Outcome: Progressing Goal: Diagnostic test results will improve 04/20/2020 0524 by Rollen Sox, RN Outcome: Progressing 04/20/2020 0524 by Rollen Sox, RN Outcome: Progressing Goal: Respiratory complications will improve Outcome: Progressing Goal: Cardiovascular complication will be avoided Outcome: Progressing   Problem: Activity: Goal: Risk for activity intolerance will decrease 04/20/2020 0524 by Rollen Sox, RN Outcome: Progressing 04/20/2020 0524 by Rollen Sox, RN Outcome: Progressing   Problem: Coping: Goal: Level of anxiety will decrease Outcome: Progressing   Problem: Pain Managment: Goal: General experience of comfort will improve 04/20/2020 0524 by Rollen Sox, RN Outcome: Progressing 04/20/2020 0524 by Rollen Sox, RN Outcome: Progressing   Problem: Safety: Goal: Ability to remain free from injury will improve 04/20/2020 0524 by Rollen Sox, RN Outcome: Progressing 04/20/2020 0524 by Rollen Sox, RN Outcome: Progressing

## 2020-04-20 NOTE — Progress Notes (Signed)
PROGRESS NOTE    Monica [name]  SWF:[id number] DOB: 1926/07/24 DOA: 04/19/2020 PCP: [name], NP   Brief Narrative:  TRINTY [name] is a 86 y.o. 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 X-ray with right femoral neck fracture.  Going for surgery on Monday as needed Eliquis clearance.  Subjective: Patient continued to experience some pain in right hip.  Stating that pain medications make it bearable.  Daughter was in the room.  Assessment & Plan:   Principal Problem:   Fracture of femoral neck, right (HCC) Active Problems:   H/O deep venous thrombosis   Essential hypertension   Iron deficiency anemia due to chronic blood loss   Chronic obstructive pulmonary disease (HCC)   Atrial fibrillation, chronic (HCC)   Diabetes mellitus without complication ([place])   Fall at home, initial encounter  Fracture of femoral neck, right ([place]): As evidenced by x-ray. Patient is going for surgery by orthopedic tomorrow. -Continue to hold Eliquis. -IV Heparin. -Continue with pain management. -PT/OT evaluation postsurgically for disposition.  Per patient she will go to rehab as she lives in independent living facility.  H/O deep venous thrombosis -switch Eliquis to IV heparin  HTN:  -Continue home medications: Lotrel, HCTC,  -hydralazine prn  Chronic anemia . Hgb stable, 9.7 -Check anemia panel. -f/u by CBC  Chronic obstructive pulmonary disease ([place]): stable -Continue theophylline, Singulair and bronchodilators  Atrial fibrillation, chronic (HCC): HR 87 -switch Eliquis to IV heparin  Diabetes mellitus without complication ([place]): Most recent A1c 6.5, controled. Patient is taking Metformin at home -SSI  Fall at home, initial encounter -pt/ot when able to  Leg edema:  pt has trace leg edema.  She is on as needed Lasix.  No history of CHF -will get 2D echo for further evaluation before surgery -Check BNP -Hold Lasix  Objective: Vitals:   04/19/20 2201 04/20/20 0250 04/20/20 0358 04/20/20 0830  BP: (!) 192/81 (!) 112/47 (!) 110/48 (!) 110/47  Pulse: 66 66 67 64  Resp: 16 17 16 18   Temp: 98.1 F (36.7 C) 98.1 F (36.7 C) 97.6 F (36.4 C) 97.7 F (36.5 C)  TempSrc: Oral Oral Oral Oral  SpO2: 98% 99% 98% 97%  Weight:      Height:        Intake/Output Summary (Last 24 hours) at 04/20/2020 1448 Last data filed at 04/20/2020 2202 Gross per 24 hour  Intake 88 ml  Output 1100 ml  Net -1012 ml   Filed Weights   04/19/20 1155  Weight: 66.7 kg    Examination:  General exam: Appears calm and comfortable  Respiratory system: Clear to auscultation. Respiratory effort normal. Cardiovascular system: S1 & S2 heard, RRR. No JVD, murmurs, rubs, . Gastrointestinal system: Soft, nontender, nondistended, bowel sounds positive. Central nervous system: Alert and oriented. No focal neurological deficits. Extremities: No edema, no cyanosis, pulses intact and symmetrical. Psychiatry: Judgement and insight appear normal. Mood & affect appropriate.    DVT prophylaxis: Heparin Code Status: Full Family Communication: Daughter was updated at bedside. Disposition Plan:  Status is: Inpatient  Remains inpatient appropriate because:Inpatient level of care appropriate due to severity of illness   Dispo: The patient is from: Home              Anticipated d/c is to: SNF  Anticipated d/c date is: 2 days              Patient currently is not medically stable to d/c.   Consultants:   Orthopedic  Procedures:  Antimicrobials:   Data Reviewed: I have personally reviewed following labs and imaging studies  CBC: Recent Labs  Lab 04/19/20 1252 04/20/20 1357  WBC 8.9 10.3  NEUTROABS 6.5  --   HGB 9.2* 9.7*  HCT 31.6* 33.0*  MCV 70.1* 70.8*    PLT 327 161   Basic Metabolic Panel: Recent Labs  Lab 04/19/20 1252  NA 137  K 4.1  CL 101  CO2 27  GLUCOSE 151*  BUN 19  CREATININE 0.84  CALCIUM 9.3   GFR: Estimated Creatinine Clearance: 40 mL/min (by C-G formula based on SCr of 0.84 mg/dL). Liver Function Tests: No results for input(s): AST, ALT, ALKPHOS, BILITOT, PROT, ALBUMIN in the last 168 hours. No results for input(s): LIPASE, AMYLASE in the last 168 hours. No results for input(s): AMMONIA in the last 168 hours. Coagulation Profile: Recent Labs  Lab 04/19/20 1252  INR 1.2   Cardiac Enzymes: No results for input(s): CKTOTAL, CKMB, CKMBINDEX, TROPONINI in the last 168 hours. BNP (last 3 results) No results for input(s): PROBNP in the last 8760 hours. HbA1C: No results for input(s): HGBA1C in the last 72 hours. CBG: Recent Labs  Lab 04/19/20 2118 04/20/20 0835 04/20/20 1211  GLUCAP 189* 132* 143*   Lipid Profile: No results for input(s): CHOL, HDL, LDLCALC, TRIG, CHOLHDL, LDLDIRECT in the last 72 hours. Thyroid Function Tests: No results for input(s): TSH, T4TOTAL, FREET4, T3FREE, THYROIDAB in the last 72 hours. Anemia Panel: No results for input(s): VITAMINB12, FOLATE, FERRITIN, TIBC, IRON, RETICCTPCT in the last 72 hours. Sepsis Labs: No results for input(s): PROCALCITON, LATICACIDVEN in the last 168 hours.  Recent Results (from the past 240 hour(s))  SARS Coronavirus 2 by RT PCR (hospital order, performed in [facility] hospital lab) Nasopharyngeal Nasopharyngeal Swab     Status: None   Collection Time: 04/19/20  2:35 PM   Specimen: Nasopharyngeal Swab  Result Value Ref Range Status   SARS Coronavirus 2 NEGATIVE NEGATIVE Final    Comment: (NOTE) SARS-CoV-2 target nucleic acids are NOT DETECTED. The SARS-CoV-2 RNA is generally detectable in upper and lower respiratory specimens during the acute phase of infection. The lowest concentration of SARS-CoV-2 viral copies this assay can detect is  250 copies / mL. A negative result does not preclude SARS-CoV-2 infection and should not be used as the sole basis for treatment or other patient management decisions.  A negative result may occur with improper specimen collection / handling, submission of specimen other than nasopharyngeal swab, presence of viral mutation(s) within the areas targeted by this assay, and inadequate number of viral copies (<250 copies / mL). A negative result must be combined with clinical observations, patient history, and epidemiological information. Fact Sheet for Patients:   StrictlyIdeas.no Fact Sheet for Healthcare Providers: BankingDealers.co.za This test is not yet approved or cleared  by the [place] FDA and has been authorized for detection and/or diagnosis of SARS-CoV-2 by FDA under an Emergency Use Authorization (EUA).  This EUA will remain in effect (meaning this test can be used) for the duration of the COVID-19 declaration under Section 564(b)(1) of the Act, 21 U.S.C. section 360bbb-3(b)(1), unless the authorization is terminated or revoked sooner. Performed at [facility], [address], [place], [place] [address]   MRSA PCR Screening  Status: None   Collection Time: 04/19/20  7:30 PM   Specimen: Nasopharyngeal  Result Value Ref Range Status   MRSA by PCR NEGATIVE NEGATIVE Final    Comment:        The GeneXpert MRSA Assay (FDA approved for NASAL specimens only), is one component of a comprehensive MRSA colonization surveillance program. It is not intended to diagnose MRSA infection nor to guide or monitor treatment for MRSA infections. Performed at [facility], [address] [place], [place] [address]      Radiology Studies: DG Chest 1 View  Result Date: 04/19/2020 CLINICAL DATA:  Fall, right hip fracture. EXAM: CHEST  1 VIEW COMPARISON:  06/04/2019 FINDINGS: Low lung volumes are present, causing  crowding of the pulmonary vasculature. Chronic reticulonodular opacities at the left lung base with chronic blunting of the left lateral costophrenic angle, appearance likely reflects residuum of chronic infection. Mild cardiomegaly. Atherosclerotic calcification of the aortic arch. Fullness in the region of the main pulmonary artery could possibly free be from pulmonary pulmonary arterial hypertension or underlying lesion/adenopathy. Old right lateral rib fractures. Thoracic spondylosis. IMPRESSION: 1. Chronic reticulonodular opacities at the left lung base, likely from residuum of chronic infection. Stable adjacent blunting of the left lateral costophrenic angle. 2. Fullness in the region of the main pulmonary artery could possibly be from pulmonary arterial hypertension or underlying lesion/adenopathy. 3. Low lung volumes. 4. Mild cardiomegaly. Electronically Signed   By: Van Clines M.D.   On: 04/19/2020 13:10   CT Head Wo Contrast  Result Date: 04/19/2020 CLINICAL DATA:  Unwitnessed fall at independent living facility, neck pain at EXAM: CT HEAD WITHOUT CONTRAST CT CERVICAL SPINE WITHOUT CONTRAST TECHNIQUE: Multidetector CT imaging of the head and cervical spine was performed following the standard protocol without intravenous contrast. Multiplanar CT image reconstructions of the cervical spine were also generated. COMPARISON:  10/24/2019 FINDINGS: CT HEAD FINDINGS Brain: Generalized atrophy. Normal ventricular morphology. No midline shift or mass effect. Small vessel chronic ischemic changes of deep cerebral white matter. No intracranial hemorrhage, mass lesion, evidence of acute infarction, or extra-axial fluid collection. Vascular: Atherosclerotic calcification of internal carotid arteries at skull base. No hyperdense vessels. Skull: Intact Sinuses/Orbits: Clear Other: N/A CT CERVICAL SPINE FINDINGS Alignment: Normal Skull base and vertebrae: Mild osseous demineralization. Skull base intact.  Multilevel facet degenerative changes. Scattered endplate spur formation as well as calcification of the anterior posterior longitudinal ligaments at several levels. Vertebral body heights maintained. No fracture, subluxation, or bone destruction. Soft tissues and spinal canal: Prevertebral soft tissues normal thickness. Atherosclerotic calcifications at the carotid bifurcations. Carotid bifurcations extend retropharyngeal bilaterally. Additional atherosclerotic calcifications at the proximal great vessels. Disc levels: AP narrowing of spinal canal LEFT paracentral due to calcified posterior longitudinal ligament at C4 with question mild flattening of the ventral aspect of the spinal cord. Upper chest: Tips of lung apices clear Other: N/A IMPRESSION: Atrophy with small vessel chronic ischemic changes of deep cerebral white matter. No acute intracranial abnormalities. Degenerative disc and facet disease changes of the cervical spine. AP narrowing of spinal canal at C4 due to calcification of the posterior longitudinal ligament question slightly flattening ventral aspect of spinal cord. Extensive atherosclerotic calcifications in the carotid systems, including carotid bifurcations which are located retropharyngeal. Electronically Signed   By: Lavonia Dana M.D.   On: 04/19/2020 13:40   CT Cervical Spine Wo Contrast  Result Date: 04/19/2020 CLINICAL DATA:  Unwitnessed fall at independent living facility, neck pain at EXAM: CT HEAD WITHOUT  CONTRAST CT CERVICAL SPINE WITHOUT CONTRAST TECHNIQUE: Multidetector CT imaging of the head and cervical spine was performed following the standard protocol without intravenous contrast. Multiplanar CT image reconstructions of the cervical spine were also generated. COMPARISON:  10/24/2019 FINDINGS: CT HEAD FINDINGS Brain: Generalized atrophy. Normal ventricular morphology. No midline shift or mass effect. Small vessel chronic ischemic changes of deep cerebral white matter. No  intracranial hemorrhage, mass lesion, evidence of acute infarction, or extra-axial fluid collection. Vascular: Atherosclerotic calcification of internal carotid arteries at skull base. No hyperdense vessels. Skull: Intact Sinuses/Orbits: Clear Other: N/A CT CERVICAL SPINE FINDINGS Alignment: Normal Skull base and vertebrae: Mild osseous demineralization. Skull base intact. Multilevel facet degenerative changes. Scattered endplate spur formation as well as calcification of the anterior posterior longitudinal ligaments at several levels. Vertebral body heights maintained. No fracture, subluxation, or bone destruction. Soft tissues and spinal canal: Prevertebral soft tissues normal thickness. Atherosclerotic calcifications at the carotid bifurcations. Carotid bifurcations extend retropharyngeal bilaterally. Additional atherosclerotic calcifications at the proximal great vessels. Disc levels: AP narrowing of spinal canal LEFT paracentral due to calcified posterior longitudinal ligament at C4 with question mild flattening of the ventral aspect of the spinal cord. Upper chest: Tips of lung apices clear Other: N/A IMPRESSION: Atrophy with small vessel chronic ischemic changes of deep cerebral white matter. No acute intracranial abnormalities. Degenerative disc and facet disease changes of the cervical spine. AP narrowing of spinal canal at C4 due to calcification of the posterior longitudinal ligament question slightly flattening ventral aspect of spinal cord. Extensive atherosclerotic calcifications in the carotid systems, including carotid bifurcations which are located retropharyngeal. Electronically Signed   By: Lavonia Dana M.D.   On: 04/19/2020 13:40   DG Hip Unilat With Pelvis 2-3 Views Right  Addendum Date: 04/19/2020   ADDENDUM REPORT: 04/19/2020 13:57 ADDENDUM: The original report was by Dr. Van Clines. The following addendum is by Dr. Van Clines: This is a correction to previous impression # 1.  Impression # 1 should read: 1. Intra-articular RIGHT femoral neck fracture with varus angulation. Discussed via Epic chat with Dr. Derrell Lolling at 1:55 p.m. on April 19, 2020. Electronically Signed   By: Van Clines M.D.   On: 04/19/2020 13:57   Result Date: 04/19/2020 CLINICAL DATA:  Fall, right hip pain EXAM: DG HIP (WITH OR WITHOUT PELVIS) 2-3V RIGHT COMPARISON:  06/03/2019 pelvic radiograph FINDINGS: Right femoral neck fracture noted, intra-articular, with varus angulation. Primarily transcervical. Left hip hemiarthroplasty. Vascular calcifications noted. No additional fractures are identified. IMPRESSION: 1. Intra-articular left femoral neck fracture with varus angulation. 2. Left hip hemiarthroplasty. 3. Atherosclerosis. Electronically Signed: By: Van Clines M.D. On: 04/19/2020 13:07    Scheduled Meds: . amLODipine  5 mg Oral Daily   And  . benazepril  20 mg Oral Daily  . feeding supplement (ENSURE ENLIVE)  237 mL Oral BID BM  . fluticasone furoate-vilanterol  1 puff Inhalation Daily  . gabapentin  600 mg Oral QHS  . hydrochlorothiazide  25 mg Oral Daily  . insulin aspart  0-5 Units Subcutaneous QHS  . insulin aspart  0-9 Units Subcutaneous TID WC  . loratadine  10 mg Oral Daily  . magnesium oxide  200 mg Oral Q0600  . montelukast  10 mg Oral QHS  . [START ON 04/21/2020] multivitamin with minerals  1 tablet Oral Daily  . oxybutynin  10 mg Oral Daily  . sodium chloride  1 g Oral TID WC  . theophylline  100 mg Oral Daily  Continuous Infusions: . heparin 1,100 Units/hr (04/19/20 2101)     LOS: 1 day   Time spent: 40 minutes.  Lorella Nimrod, MD [facility]  If 7PM-7AM, please contact night-coverage [email or web address]  04/20/2020, 2:48 PM   This record has been created using Systems analyst. Errors have been sought and corrected,but may not always be located. Such creation errors do not reflect on the standard of care.

## 2020-04-21 ENCOUNTER — Encounter
Admission: EM | Disposition: A | Discharge status: Skilled Nursing Facility | Payer: Self-pay | Source: Home / Self Care | Attending: Hospitalist

## 2020-04-21 ENCOUNTER — Other Ambulatory Visit: Payer: Self-pay

## 2020-04-21 ENCOUNTER — Inpatient Hospital Stay: Payer: Medicare Other

## 2020-04-21 ENCOUNTER — Inpatient Hospital Stay: Payer: Medicare Other | Admitting: Anesthesiology

## 2020-04-21 ENCOUNTER — Encounter: Payer: Self-pay | Admitting: Internal Medicine

## 2020-04-21 HISTORY — PX: HIP ARTHROPLASTY: SHX981

## 2020-04-21 LAB — GLUCOSE, CAPILLARY
Glucose-Capillary: 202 mg/dL — ABNORMAL HIGH (ref 70–99)
Glucose-Capillary: 132 mg/dL — ABNORMAL HIGH (ref 70–99)
Glucose-Capillary: 149 mg/dL — ABNORMAL HIGH (ref 70–99)
Glucose-Capillary: 122 mg/dL — ABNORMAL HIGH (ref 70–99)
Glucose-Capillary: 122 mg/dL — ABNORMAL HIGH (ref 70–99)
Glucose-Capillary: 159 mg/dL — ABNORMAL HIGH (ref 70–99)

## 2020-04-21 LAB — ECHOCARDIOGRAM COMPLETE
Height: 63 in
Weight: 2352 oz

## 2020-04-21 LAB — APTT: aPTT: 85 seconds — ABNORMAL HIGH (ref 24–36)

## 2020-04-21 LAB — CBC
HCT: 29.7 % — ABNORMAL LOW (ref 36.0–46.0)
Hemoglobin: 9.1 g/dL — ABNORMAL LOW (ref 12.0–15.0)
MCH: 20.8 pg — ABNORMAL LOW (ref 26.0–34.0)
MCHC: 30.6 g/dL (ref 30.0–36.0)
MCV: 68 fL — ABNORMAL LOW (ref 80.0–100.0)
Platelets: 327 10*3/uL (ref 150–400)
RBC: 4.37 MIL/uL (ref 3.87–5.11)
RDW: 19.4 % — ABNORMAL HIGH (ref 11.5–15.5)
WBC: 9.6 10*3/uL (ref 4.0–10.5)
nRBC: 0 % (ref 0.0–0.2)

## 2020-04-21 LAB — HEPARIN LEVEL (UNFRACTIONATED): Heparin Unfractionated: 1.18 IU/mL — ABNORMAL HIGH (ref 0.30–0.70)

## 2020-04-21 SURGERY — HEMIARTHROPLASTY, HIP, DIRECT ANTERIOR APPROACH, FOR FRACTURE
Anesthesia: General | Site: Hip | Laterality: Right

## 2020-04-21 MED ORDER — FLEET ENEMA 7-19 GM/118ML RE ENEM: 1.0000 | ENEMA | Freq: Once | RECTAL | Status: DC | PRN

## 2020-04-21 MED ORDER — PHENYLEPHRINE HCL (PRESSORS) 10 MG/ML IV SOLN
INTRAVENOUS | Status: AC
  Filled 2020-04-21: qty 1

## 2020-04-21 MED ORDER — LIDOCAINE HCL (PF) 2 % IJ SOLN
INTRAMUSCULAR | Status: AC
  Filled 2020-04-21: qty 5

## 2020-04-21 MED ORDER — METOCLOPRAMIDE HCL 5 MG/ML IJ SOLN: 5.0000 mg | Freq: Three times a day (TID) | INTRAMUSCULAR | Status: DC | PRN

## 2020-04-21 MED ORDER — MAGNESIUM HYDROXIDE 400 MG/5ML PO SUSP: 30.0000 mL | Freq: Every day | ORAL | Status: DC | PRN

## 2020-04-21 MED ORDER — FENTANYL CITRATE (PF) 100 MCG/2ML IJ SOLN
INTRAMUSCULAR | Status: AC
  Filled 2020-04-21: qty 2

## 2020-04-21 MED ORDER — TRANEXAMIC ACID 1000 MG/10ML IV SOLN
INTRAVENOUS | Status: DC | PRN
  Administered 2020-04-21: 1000 mg via TOPICAL

## 2020-04-21 MED ORDER — SODIUM CHLORIDE 0.9 % IV SOLN: INTRAVENOUS | Status: DC | PRN

## 2020-04-21 MED ORDER — SUCCINYLCHOLINE CHLORIDE 200 MG/10ML IV SOSY
PREFILLED_SYRINGE | INTRAVENOUS | Status: AC
  Filled 2020-04-21: qty 10

## 2020-04-21 MED ORDER — LIDOCAINE HCL (CARDIAC) PF 100 MG/5ML IV SOSY
PREFILLED_SYRINGE | INTRAVENOUS | Status: DC | PRN
  Administered 2020-04-21: 50 mg via INTRAVENOUS

## 2020-04-21 MED ORDER — DOCUSATE SODIUM 100 MG PO CAPS
100.0000 mg | ORAL_CAPSULE | Freq: Two times a day (BID) | ORAL | Status: DC
  Administered 2020-04-21 – 2020-04-24 (×6): 100 mg via ORAL
  Filled 2020-04-21 (×6): qty 1

## 2020-04-21 MED ORDER — BUPIVACAINE-EPINEPHRINE (PF) 0.5% -1:200000 IJ SOLN
INTRAMUSCULAR | Status: DC | PRN
  Administered 2020-04-21: 30 mL

## 2020-04-21 MED ORDER — METOCLOPRAMIDE HCL 10 MG PO TABS: 5.0000 mg | ORAL_TABLET | Freq: Three times a day (TID) | ORAL | Status: DC | PRN

## 2020-04-21 MED ORDER — PROPOFOL 10 MG/ML IV BOLUS
INTRAVENOUS | Status: AC
  Filled 2020-04-21: qty 20

## 2020-04-21 MED ORDER — ROCURONIUM BROMIDE 10 MG/ML (PF) SYRINGE
PREFILLED_SYRINGE | INTRAVENOUS | Status: AC
  Filled 2020-04-21: qty 10

## 2020-04-21 MED ORDER — ROCURONIUM BROMIDE 100 MG/10ML IV SOLN
INTRAVENOUS | Status: DC | PRN
  Administered 2020-04-21: 40 mg via INTRAVENOUS
  Administered 2020-04-21: 10 mg via INTRAVENOUS

## 2020-04-21 MED ORDER — ONDANSETRON HCL 4 MG/2ML IJ SOLN: 4.0000 mg | Freq: Four times a day (QID) | INTRAMUSCULAR | Status: DC | PRN

## 2020-04-21 MED ORDER — PHENYLEPHRINE HCL (PRESSORS) 10 MG/ML IV SOLN
INTRAVENOUS | Status: DC | PRN
  Administered 2020-04-21 (×5): 100 ug via INTRAVENOUS

## 2020-04-21 MED ORDER — DIPHENHYDRAMINE HCL 12.5 MG/5ML PO ELIX: 12.5000 mg | ORAL_SOLUTION | ORAL | Status: DC | PRN

## 2020-04-21 MED ORDER — CEFAZOLIN SODIUM-DEXTROSE 2-4 GM/100ML-% IV SOLN
2.0000 g | Freq: Four times a day (QID) | INTRAVENOUS | Status: AC
  Administered 2020-04-22 (×3): 2 g via INTRAVENOUS
  Filled 2020-04-21 (×3): qty 100

## 2020-04-21 MED ORDER — CEFAZOLIN SODIUM-DEXTROSE 2-4 GM/100ML-% IV SOLN
INTRAVENOUS | Status: AC
  Filled 2020-04-21: qty 100

## 2020-04-21 MED ORDER — CEFAZOLIN SODIUM-DEXTROSE 2-4 GM/100ML-% IV SOLN
2.0000 g | INTRAVENOUS | Status: AC
  Administered 2020-04-21: 2 g via INTRAVENOUS
  Filled 2020-04-21: qty 100

## 2020-04-21 MED ORDER — SODIUM CHLORIDE 0.9 % IV SOLN
INTRAVENOUS | Status: DC | PRN
  Administered 2020-04-21: 60 mL

## 2020-04-21 MED ORDER — ONDANSETRON HCL 4 MG PO TABS: 4.0000 mg | ORAL_TABLET | Freq: Four times a day (QID) | ORAL | Status: DC | PRN

## 2020-04-21 MED ORDER — ONDANSETRON HCL 4 MG/2ML IJ SOLN: 4.0000 mg | Freq: Once | INTRAMUSCULAR | Status: DC | PRN

## 2020-04-21 MED ORDER — FENTANYL CITRATE (PF) 100 MCG/2ML IJ SOLN
INTRAMUSCULAR | Status: DC | PRN
  Administered 2020-04-21 (×2): 25 ug via INTRAVENOUS
  Administered 2020-04-21: 50 ug via INTRAVENOUS

## 2020-04-21 MED ORDER — SODIUM CHLORIDE 0.9 % IV SOLN
INTRAVENOUS | Status: DC | PRN
  Administered 2020-04-21: 50 ug/min via INTRAVENOUS

## 2020-04-21 MED ORDER — BUPIVACAINE LIPOSOME 1.3 % IJ SUSP: INTRAMUSCULAR | Status: DC | PRN

## 2020-04-21 MED ORDER — ONDANSETRON HCL 4 MG/2ML IJ SOLN
INTRAMUSCULAR | Status: AC
  Filled 2020-04-21: qty 2

## 2020-04-21 MED ORDER — PROPOFOL 10 MG/ML IV BOLUS
INTRAVENOUS | Status: DC | PRN
  Administered 2020-04-21: 60 mg via INTRAVENOUS

## 2020-04-21 MED ORDER — APIXABAN 2.5 MG PO TABS
2.5000 mg | ORAL_TABLET | Freq: Two times a day (BID) | ORAL | Status: DC
  Administered 2020-04-22 – 2020-04-24 (×5): 2.5 mg via ORAL
  Filled 2020-04-21 (×5): qty 1

## 2020-04-21 MED ORDER — SUGAMMADEX SODIUM 200 MG/2ML IV SOLN
INTRAVENOUS | Status: DC | PRN
  Administered 2020-04-21: 140 mg via INTRAVENOUS

## 2020-04-21 MED ORDER — FENTANYL CITRATE (PF) 100 MCG/2ML IJ SOLN: 25.0000 ug | INTRAMUSCULAR | Status: DC | PRN

## 2020-04-21 MED ORDER — BISACODYL 10 MG RE SUPP
10.0000 mg | Freq: Every day | RECTAL | Status: DC | PRN
  Administered 2020-04-23: 10 mg via RECTAL
  Filled 2020-04-21: qty 1

## 2020-04-21 MED ORDER — ONDANSETRON HCL 4 MG/2ML IJ SOLN
INTRAMUSCULAR | Status: DC | PRN
  Administered 2020-04-21: 4 mg via INTRAVENOUS

## 2020-04-21 SURGICAL SUPPLY — 68 items
APL PRP STRL LF DISP 70% ISPRP (MISCELLANEOUS) ×2
BAG DECANTER FOR FLEXI CONT (MISCELLANEOUS) IMPLANT
BLADE SAGITTAL WIDE XTHICK NO (BLADE) ×2 IMPLANT
BLADE SURG SZ20 CARB STEEL (BLADE) ×2 IMPLANT
BNDG COHESIVE 6X5 TAN STRL LF (GAUZE/BANDAGES/DRESSINGS) ×2 IMPLANT
BOWL CEMENT MIXING ADV NOZZLE (MISCELLANEOUS) IMPLANT
CANISTER SUCT 1200ML W/VALVE (MISCELLANEOUS) ×2 IMPLANT
CANISTER SUCT 3000ML PPV (MISCELLANEOUS) ×4 IMPLANT
CEMENT BONE 40GM (Cement) IMPLANT
CHLORAPREP W/TINT 26 (MISCELLANEOUS) ×4 IMPLANT
COVER WAND RF STERILE (DRAPES) ×2 IMPLANT
DECANTER SPIKE VIAL GLASS SM (MISCELLANEOUS) ×2 IMPLANT
DRAPE 3/4 80X56 (DRAPES) ×1 IMPLANT
DRAPE INCISE IOBAN 66X60 STRL (DRAPES) ×2 IMPLANT
DRAPE SPLIT 6X30 W/TAPE (DRAPES) ×2 IMPLANT
DRAPE SURG 17X11 SM STRL (DRAPES) ×2 IMPLANT
DRAPE SURG 17X23 STRL (DRAPES) ×2 IMPLANT
DRSG OPSITE POSTOP 4X12 (GAUZE/BANDAGES/DRESSINGS) ×1 IMPLANT
DRSG OPSITE POSTOP 4X14 (GAUZE/BANDAGES/DRESSINGS) IMPLANT
DRSG OPSITE POSTOP 4X8 (GAUZE/BANDAGES/DRESSINGS) ×2 IMPLANT
ELECT BLADE 6.5 EXT (BLADE) ×2 IMPLANT
ELECT CAUTERY BLADE 6.4 (BLADE) ×2 IMPLANT
ELECT REM PT RETURN 9FT ADLT (ELECTROSURGICAL) ×2
ELECTRODE REM PT RTRN 9FT ADLT (ELECTROSURGICAL) ×1 IMPLANT
GAUZE PACK 2X3YD (GAUZE/BANDAGES/DRESSINGS) IMPLANT
GLOVE BIO SURGEON STRL SZ8 (GLOVE) ×4 IMPLANT
GLOVE BIOGEL M STRL SZ7.5 (GLOVE) IMPLANT
GLOVE BIOGEL PI IND STRL 8 (GLOVE) IMPLANT
GLOVE BIOGEL PI INDICATOR 8 (GLOVE)
GLOVE INDICATOR 8.0 STRL GRN (GLOVE) ×2 IMPLANT
GOWN STRL REUS W/ TWL LRG LVL3 (GOWN DISPOSABLE) ×1 IMPLANT
GOWN STRL REUS W/ TWL XL LVL3 (GOWN DISPOSABLE) ×1 IMPLANT
GOWN STRL REUS W/TWL LRG LVL3 (GOWN DISPOSABLE) ×2
GOWN STRL REUS W/TWL XL LVL3 (GOWN DISPOSABLE) ×2
HEAD ENDO II MOD SZ 43 (Orthopedic Implant) ×1 IMPLANT
HOOD PEEL AWAY FLYTE STAYCOOL (MISCELLANEOUS) ×6 IMPLANT
INSERT TAPER ENDO II -6 (Orthopedic Implant) ×1 IMPLANT
IV NS 100ML SINGLE PACK (IV SOLUTION) IMPLANT
LABEL OR SOLS (LABEL) ×2 IMPLANT
NDL FILTER BLUNT 18X1 1/2 (NEEDLE) ×1 IMPLANT
NDL SAFETY ECLIPSE 18X1.5 (NEEDLE) ×1 IMPLANT
NDL SPNL 20GX3.5 QUINCKE YW (NEEDLE) ×1 IMPLANT
NEEDLE FILTER BLUNT 18X 1/2SAF (NEEDLE) ×1
NEEDLE FILTER BLUNT 18X1 1/2 (NEEDLE) ×1 IMPLANT
NEEDLE HYPO 18GX1.5 SHARP (NEEDLE) ×2
NEEDLE SPNL 20GX3.5 QUINCKE YW (NEEDLE) ×2 IMPLANT
NS IRRIG 1000ML POUR BTL (IV SOLUTION) ×2 IMPLANT
PACK HIP PROSTHESIS (MISCELLANEOUS) ×2 IMPLANT
PULSAVAC PLUS IRRIG FAN TIP (DISPOSABLE) ×2
SOL .9 NS 3000ML IRR  AL (IV SOLUTION) ×4
SOL .9 NS 3000ML IRR AL (IV SOLUTION) ×2
SOL .9 NS 3000ML IRR UROMATIC (IV SOLUTION) ×2 IMPLANT
STAPLER SKIN PROX 35W (STAPLE) ×2 IMPLANT
STEM COLLARLESS RED 12X120X130 (Stem) ×1 IMPLANT
STRAP SAFETY 5IN WIDE (MISCELLANEOUS) ×2 IMPLANT
SUT ETHIBOND 2 V 37 (SUTURE) ×2 IMPLANT
SUT VIC AB 1 CT1 36 (SUTURE) IMPLANT
SUT VIC AB 2-0 CT1 (SUTURE) ×4 IMPLANT
SUT VIC AB 2-0 CT1 27 (SUTURE)
SUT VIC AB 2-0 CT1 TAPERPNT 27 (SUTURE) IMPLANT
SUT VICRYL 1-0 27IN ABS (SUTURE) ×4
SUTURE VICRYL 1-0 27IN ABS (SUTURE) ×2 IMPLANT
SYR 10ML LL (SYRINGE) ×2 IMPLANT
SYR 30ML LL (SYRINGE) ×6 IMPLANT
SYR TB 1ML 27GX1/2 LL (SYRINGE) IMPLANT
TAPE TRANSPORE STRL 2 31045 (GAUZE/BANDAGES/DRESSINGS) ×2 IMPLANT
TIP BRUSH PULSAVAC PLUS 24.33 (MISCELLANEOUS) ×1 IMPLANT
TIP FAN IRRIG PULSAVAC PLUS (DISPOSABLE) ×1 IMPLANT

## 2020-04-21 NOTE — Transfer of Care (Signed)
Immediate Anesthesia Transfer of Care Note  Patient: Monica [name]  Procedure(s) Performed: ARTHROPLASTY BIPOLAR HIP (HEMIARTHROPLASTY) (Right Hip)  Patient Location: PACU  Anesthesia Type:General  Level of Consciousness: sedated and patient cooperative  Airway & Oxygen Therapy: Patient Spontanous Breathing and Patient connected to face mask oxygen  Post-op Assessment: Report given to RN and Post -op Vital signs reviewed and stable  Post vital signs: Reviewed and stable  Last Vitals:  Vitals Value Taken Time  BP 141/41 04/21/20 1859  Temp 36.8 C 04/21/20 1859  Pulse 78 04/21/20 1901  Resp 17 04/21/20 1901  SpO2 100 % 04/21/20 1901  Vitals shown include unvalidated device data.  Last Pain:  Vitals:   04/21/20 1559  TempSrc: Tympanic  PainSc: 0-No pain      Patients Stated Pain Goal: 2 (29/02/11 1552)  Complications: No apparent anesthesia complications

## 2020-04-21 NOTE — Anesthesia Postprocedure Evaluation (Signed)
Anesthesia Post Note  Patient: Monica [name]  Procedure(s) Performed: ARTHROPLASTY BIPOLAR HIP (HEMIARTHROPLASTY) (Right Hip)  Patient location during evaluation: PACU Anesthesia Type: General Level of consciousness: awake and alert Pain management: pain level controlled Vital Signs Assessment: post-procedure vital signs reviewed and stable Respiratory status: spontaneous breathing and respiratory function stable Cardiovascular status: stable Anesthetic complications: no     Last Vitals:  Vitals:   04/21/20 1925 04/21/20 1930  BP:  (!) 139/56  Pulse: 83 78  Resp: 19 17  Temp:    SpO2: 96% 97%    Last Pain:  Vitals:   04/21/20 1915  TempSrc:   PainSc: 0-No pain                 [name] K

## 2020-04-21 NOTE — Anesthesia Procedure Notes (Signed)
Procedure Name: Intubation Date/Time: 04/21/2020 4:57 PM Performed by: Jonna Clark, CRNA Pre-anesthesia Checklist: Patient identified, Emergency Drugs available, Suction available, Patient being monitored and Timeout performed Patient Re-evaluated:Patient Re-evaluated prior to induction Oxygen Delivery Method: Circle system utilized Preoxygenation: Pre-oxygenation with 100% oxygen Induction Type: IV induction Ventilation: Mask ventilation without difficulty Laryngoscope Size: McGraph Grade View: Grade I Tube type: Oral Tube size: 6.5 mm Number of attempts: 1 Airway Equipment and Method: Stylet Placement Confirmation: ETT inserted through vocal cords under direct vision,  positive ETCO2 and breath sounds checked- equal and bilateral Secured at: 19 cm Tube secured with: Tape Dental Injury: Teeth and Oropharynx as per pre-operative assessment

## 2020-04-21 NOTE — H&P (Signed)
H&P and consult reviewed and patient re-examined. No changes. 

## 2020-04-21 NOTE — Progress Notes (Signed)
PROGRESS NOTE    Monica [name]  LZJ:[id number] DOB: 08-05-1926 DOA: 04/19/2020 PCP: [name], NP   Brief Narrative:  Monica [name] is a 86 y.o. 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 X-ray with right femoral neck fracture.  Going for surgery on Monday as needed Eliquis clearance.  Subjective: Patient continued to experience some pain in right hip.  Pain increased with any movements around the hip.  It is bearable when she is lying still.  Assessment & Plan:   Principal Problem:   Fracture of femoral neck, right (HCC) Active Problems:   H/O deep venous thrombosis   Essential hypertension   Iron deficiency anemia due to chronic blood loss   Chronic obstructive pulmonary disease (HCC)   Atrial fibrillation, chronic (HCC)   Diabetes mellitus without complication ([place])   Fall at home, initial encounter  Fracture of femoral neck, right ([place]): As evidenced by x-ray. Patient is going for surgery by orthopedic today. -Continue to hold Eliquis. -IV Heparin. -Continue with pain management. -PT/OT evaluation postsurgically for disposition.  Per patient she will go to rehab as she lives in independent living facility.  H/O deep venous thrombosis -switch Eliquis to IV heparin  HTN:  -Continue home medications: Lotrel, HCTC,  -hydralazine prn  Chronic anemia . Hgb stable, 9.7 -Check anemia panel. -f/u by CBC  Chronic obstructive pulmonary disease ([place]): stable -Continue theophylline, Singulair and bronchodilators  Atrial fibrillation, chronic (HCC): HR 87 -switch Eliquis to IV heparin  Diabetes mellitus without complication ([place]): Most recent A1c 6.5, controled. Patient is taking Metformin at home -SSI  Fall at home, initial encounter -pt/ot when able  to  Leg edema: pt has trace leg edema.  She is on as needed Lasix.  No history of CHF -will get 2D echo for further evaluation before surgery -Check BNP -Hold Lasix  Objective: Vitals:   04/21/20 0401 04/21/20 0744 04/21/20 1129 04/21/20 1559  BP: (!) 123/51 (!) 121/48 (!) 112/48 (!) 117/58  Pulse: 64 (!) 58 64 69  Resp: 16 16 16 17   Temp: 97.8 F (36.6 C) 98.1 F (36.7 C) 97.8 F (36.6 C) 98.2 F (36.8 C)  TempSrc: Oral Oral Oral Tympanic  SpO2: 98% 100% 96% 95%  Weight:    66.7 kg  Height:    5\' 3"  (1.6 m)    Intake/Output Summary (Last 24 hours) at 04/21/2020 1617 Last data filed at 04/21/2020 1157 Gross per 24 hour  Intake 806.02 ml  Output 2600 ml  Net -1793.98 ml   Filed Weights   04/19/20 1155 04/21/20 1559  Weight: 66.7 kg 66.7 kg    Examination:  General exam: Appears calm and comfortable  Respiratory system: Clear to auscultation. Respiratory effort normal. Cardiovascular system: S1 & S2 heard, RRR. No JVD, murmurs, rubs, . Gastrointestinal system: Soft, nontender, nondistended, bowel sounds positive. Central nervous system: Alert and oriented. No focal neurological deficits. Extremities: No edema, no cyanosis, pulses intact and symmetrical. Psychiatry: Judgement and insight appear normal. Mood & affect appropriate.    DVT prophylaxis: Heparin Code Status: Full Family Communication: Daughter was updated at bedside. Disposition Plan:  Status is: Inpatient  Remains inpatient appropriate because:Inpatient level of care appropriate due to severity of illness   Dispo: The patient is from: Home  Anticipated d/c is to: SNF              Anticipated d/c date is: 2 days              Patient currently is not medically stable to d/c.   Consultants:   Orthopedic  Procedures:  Antimicrobials:   Data Reviewed: I have personally reviewed following labs and imaging studies  CBC: Recent Labs  Lab 04/19/20 1252 04/20/20 1357 04/21/20 0419   WBC 8.9 10.3 9.6  NEUTROABS 6.5  --   --   HGB 9.2* 9.7* 9.1*  HCT 31.6* 33.0* 29.7*  MCV 70.1* 70.8* 68.0*  PLT 327 310 782   Basic Metabolic Panel: Recent Labs  Lab 04/19/20 1252  NA 137  K 4.1  CL 101  CO2 27  GLUCOSE 151*  BUN 19  CREATININE 0.84  CALCIUM 9.3   GFR: Estimated Creatinine Clearance: 40 mL/min (by C-G formula based on SCr of 0.84 mg/dL). Liver Function Tests: No results for input(s): AST, ALT, ALKPHOS, BILITOT, PROT, ALBUMIN in the last 168 hours. No results for input(s): LIPASE, AMYLASE in the last 168 hours. No results for input(s): AMMONIA in the last 168 hours. Coagulation Profile: Recent Labs  Lab 04/19/20 1252  INR 1.2   Cardiac Enzymes: No results for input(s): CKTOTAL, CKMB, CKMBINDEX, TROPONINI in the last 168 hours. BNP (last 3 results) No results for input(s): PROBNP in the last 8760 hours. HbA1C: No results for input(s): HGBA1C in the last 72 hours. CBG: Recent Labs  Lab 04/20/20 1211 04/20/20 1716 04/20/20 2101 04/21/20 0745 04/21/20 1130  GLUCAP 143* 171* 202* 132* 149*   Lipid Profile: No results for input(s): CHOL, HDL, LDLCALC, TRIG, CHOLHDL, LDLDIRECT in the last 72 hours. Thyroid Function Tests: No results for input(s): TSH, T4TOTAL, FREET4, T3FREE, THYROIDAB in the last 72 hours. Anemia Panel: Recent Labs    04/19/20 1252 04/20/20 0528 04/20/20 1357  VITAMINB12 277  --   --   FOLATE  --  9.8  --   FERRITIN  --  9*  --   TIBC  --  414  --   IRON  --  23*  --   RETICCTPCT  --   --  1.3   Sepsis Labs: No results for input(s): PROCALCITON, LATICACIDVEN in the last 168 hours.  Recent Results (from the past 240 hour(s))  SARS Coronavirus 2 by RT PCR (hospital order, performed in [facility] hospital lab) Nasopharyngeal Nasopharyngeal Swab     Status: None   Collection Time: 04/19/20  2:35 PM   Specimen: Nasopharyngeal Swab  Result Value Ref Range Status   SARS Coronavirus 2 NEGATIVE NEGATIVE Final    Comment:  (NOTE) SARS-CoV-2 target nucleic acids are NOT DETECTED. The SARS-CoV-2 RNA is generally detectable in upper and lower respiratory specimens during the acute phase of infection. The lowest concentration of SARS-CoV-2 viral copies this assay can detect is 250 copies / mL. A negative result does not preclude SARS-CoV-2 infection and should not be used as the sole basis for treatment or other patient management decisions.  A negative result may occur with improper specimen collection / handling, submission of specimen other than nasopharyngeal swab, presence of viral mutation(s) within the areas targeted by this assay, and inadequate number of viral copies (<250 copies / mL). A negative result must be combined with clinical observations, patient history, and epidemiological information. Fact Sheet for Patients:   StrictlyIdeas.no Fact Sheet for Healthcare Providers: BankingDealers.co.za This test is not  yet approved or cleared  by the [place] and has been authorized for detection and/or diagnosis of SARS-CoV-2 by FDA under an Emergency Use Authorization (EUA).  This EUA will remain in effect (meaning this test can be used) for the duration of the COVID-19 declaration under Section 564(b)(1) of the Act, 21 U.S.C. section 360bbb-3(b)(1), unless the authorization is terminated or revoked sooner. Performed at [facility], [address] [place], [place] [address]   MRSA PCR Screening     Status: None   Collection Time: 04/19/20  7:30 PM   Specimen: Nasopharyngeal  Result Value Ref Range Status   MRSA by PCR NEGATIVE NEGATIVE Final    Comment:        The GeneXpert MRSA Assay (FDA approved for NASAL specimens only), is one component of a comprehensive MRSA colonization surveillance program. It is not intended to diagnose MRSA infection nor to guide or monitor treatment for MRSA infections. Performed at [facility], [address], [place], [place] [address]      Radiology Studies: ECHOCARDIOGRAM COMPLETE  Result Date: 04/21/2020    ECHOCARDIOGRAM REPORT   Patient Name:   Monica [name] Date of Exam: 04/20/2020 Medical Rec #:  [id number]     Height:       63.0 in Accession #:    [id number]    Weight:       147.0 lb Date of Birth:  [birth year]     BSA:          1.696 m Patient Age:    68 years      BP:           110/48 mmHg Patient Gender: F             HR:           67 bpm. Exam Location:  [facility] Procedure: 2D Echo Indications:     Bilateral Leg Edema  History:         Patient has no prior history of Echocardiogram examinations.                  COPD and Stroke, Arrythmias:Atrial Fibrillation; Risk                  Factors:Hypertension, Dyslipidemia and Diabetes.  Sonographer:     L Thornton-Maynard Referring Phys:  [id number] Soledad Gerlach NIU Diagnosing Phys: Kathlyn Sacramento MD IMPRESSIONS  1. Left ventricular ejection fraction, by estimation, is 60 to 65%. The left ventricle has normal function. The left ventricle has no regional wall motion abnormalities. Left ventricular diastolic parameters are indeterminate.  2. Right ventricular systolic function is normal. The right ventricular size is normal. Tricuspid regurgitation signal is inadequate for assessing PA pressure.  3. The mitral valve is normal in structure. No evidence of mitral valve regurgitation. No evidence of mitral stenosis.  4. The aortic valve is normal in structure. Aortic valve regurgitation is not visualized. Mild to moderate aortic valve sclerosis/calcification is present, without any evidence of aortic stenosis.  5. The inferior vena cava is normal in size with greater than 50% respiratory variability, suggesting right atrial pressure of 3 mmHg. FINDINGS  Left Ventricle: Left ventricular ejection fraction, by estimation, is 60 to 65%. The left ventricle has normal function. The left ventricle has no regional wall motion abnormalities. The left ventricular  internal cavity size was normal in size. There is  no left ventricular hypertrophy. Left ventricular diastolic parameters are indeterminate. Right Ventricle: The right ventricular  size is normal. No increase in right ventricular wall thickness. Right ventricular systolic function is normal. Tricuspid regurgitation signal is inadequate for assessing PA pressure. The tricuspid regurgitant velocity is 1.85 m/s, and with an assumed right atrial pressure of 10 mmHg, the estimated right ventricular systolic pressure is 80.0 mmHg. Left Atrium: Left atrial size was normal in size. Right Atrium: Right atrial size was normal in size. Pericardium: There is no evidence of pericardial effusion. Mitral Valve: The mitral valve is normal in structure. Normal mobility of the mitral valve leaflets. No evidence of mitral valve regurgitation. No evidence of mitral valve stenosis. Tricuspid Valve: The tricuspid valve is normal in structure. Tricuspid valve regurgitation is trivial. No evidence of tricuspid stenosis. Aortic Valve: The aortic valve is normal in structure. Aortic valve regurgitation is not visualized. Mild to moderate aortic valve sclerosis/calcification is present, without any evidence of aortic stenosis. Aortic valve mean gradient measures 4.0 mmHg. Aortic valve peak gradient measures 7.4 mmHg. Aortic valve area, by VTI measures 1.46 cm. Pulmonic Valve: The pulmonic valve was normal in structure. Pulmonic valve regurgitation is trivial. No evidence of pulmonic stenosis. Aorta: The aortic root is normal in size and structure. Venous: The inferior vena cava is normal in size with greater than 50% respiratory variability, suggesting right atrial pressure of 3 mmHg. IAS/Shunts: No atrial level shunt detected by color flow Doppler.  LEFT VENTRICLE PLAX 2D LVIDd:         3.92 cm  Diastology LVIDs:         2.60 cm  LV e' lateral:   5.00 cm/s LV PW:         0.94 cm  LV E/e' lateral: 21.8 LV IVS:        0.84 cm  LV e' medial:     5.00 cm/s LVOT diam:     1.90 cm  LV E/e' medial:  21.8 LV SV:         49 LV SV Index:   29 LVOT Area:     2.84 cm  RIGHT VENTRICLE RV S prime:     12.40 cm/s TAPSE (M-mode): 2.2 cm LEFT ATRIUM             Index LA diam:        3.50 cm 2.06 cm/m LA Vol (A2C):   42.8 ml 25.23 ml/m LA Vol (A4C):   34.1 ml 20.10 ml/m LA Biplane Vol: 38.4 ml 22.64 ml/m  AORTIC VALVE                   PULMONIC VALVE AV Area (Vmax):    1.44 cm    PV Vmax:       1.01 m/s AV Area (Vmean):   1.58 cm    PV Peak grad:  4.1 mmHg AV Area (VTI):     1.46 cm AV Vmax:           136.00 cm/s AV Vmean:          96.300 cm/s AV VTI:            0.333 m AV Peak Grad:      7.4 mmHg AV Mean Grad:      4.0 mmHg LVOT Vmax:         69.20 cm/s LVOT Vmean:        53.700 cm/s LVOT VTI:          0.172 m LVOT/AV VTI ratio: 0.52  AORTA Ao Root diam: 2.90 cm MITRAL VALVE  TRICUSPID VALVE MV Area (PHT): 2.02 cm     TR Peak grad:   13.7 mmHg MV E velocity: 109.00 cm/s  TR Vmax:        185.00 cm/s MV A velocity: 101.00 cm/s MV E/A ratio:  1.08         SHUNTS                             Systemic VTI:  0.17 m                             Systemic Diam: 1.90 cm Kathlyn Sacramento MD Electronically signed by Kathlyn Sacramento MD Signature Date/Time: 04/21/2020/9:19:36 AM    Final     Scheduled Meds: . [MAR Hold] amLODipine  5 mg Oral Daily   And  . [MAR Hold] benazepril  20 mg Oral Daily  . [MAR Hold] feeding supplement (ENSURE ENLIVE)  237 mL Oral BID BM  . [MAR Hold] fluticasone furoate-vilanterol  1 puff Inhalation Daily  . [MAR Hold] gabapentin  600 mg Oral QHS  . [MAR Hold] hydrochlorothiazide  25 mg Oral Daily  . [MAR Hold] insulin aspart  0-5 Units Subcutaneous QHS  . [MAR Hold] insulin aspart  0-9 Units Subcutaneous TID WC  . [MAR Hold] loratadine  10 mg Oral Daily  . [MAR Hold] magnesium oxide  200 mg Oral Q0600  . [MAR Hold] montelukast  10 mg Oral QHS  . [MAR Hold] multivitamin with minerals  1 tablet Oral Daily  . [MAR Hold]  oxybutynin  10 mg Oral Daily  . [MAR Hold] sodium chloride  1 g Oral TID WC  . [MAR Hold] theophylline  100 mg Oral Daily   Continuous Infusions: . ceFAZolin    . [MAR Hold]  ceFAZolin (ANCEF) IV       LOS: 2 days   Time spent: 35 minutes.  Lorella Nimrod, MD [facility]  If 7PM-7AM, please contact night-coverage [email or web address]  04/21/2020, 4:17 PM   This record has been created using Systems analyst. Errors have been sought and corrected,but may not always be located. Such creation errors do not reflect on the standard of care.

## 2020-04-21 NOTE — Progress Notes (Signed)
ANTICOAGULATION CONSULT NOTE - Initial Consult  Pharmacy Consult for Heparin  Indication: DVT  Allergies  Allergen Reactions  . Sulfa Antibiotics Swelling  . Celecoxib Nausea And Vomiting  . Acetaminophen Itching  . Codeine Rash  . Lyrica [Pregabalin] Rash  . Penicillin G Rash    Has patient had a PCN reaction causing immediate rash, facial/tongue/throat swelling, SOB or lightheadedness with hypotension: Yes Has patient had a PCN reaction causing severe rash involving mucus membranes or skin necrosis: No Has patient had a PCN reaction that required hospitalization: No Has patient had a PCN reaction occurring within the last 10 years: Unknown If all of the above answers are "NO", then may proceed with Cephalosporin use.  Marland Kitchen Petrolatum-Zinc Oxide Rash    Patient Measurements: Height: 5\' 3"  (160 cm) Weight: 66.7 kg (147 lb) IBW/kg (Calculated) : 52.4 Heparin Dosing Weight: 65.9 kg   Vital Signs: Temp: 97.8 F (36.6 C) (06/07 0401) Temp Source: Oral (06/07 0401) BP: 123/51 (06/07 0401) Pulse Rate: 64 (06/07 0401)  Labs: Recent Labs    04/19/20 1252 04/19/20 1252 04/19/20 1631 04/19/20 1820 04/20/20 0525 04/20/20 1357 04/21/20 0419  HGB 9.2*   < >  --   --   --  9.7* 9.1*  HCT 31.6*  --   --   --   --  33.0* 29.7*  PLT 327  --   --   --   --  310 327  APTT  --   --  27   < > 82* 95* 85*  LABPROT 14.6  --   --   --   --   --   --   INR 1.2  --   --   --   --   --   --   HEPARINUNFRC  --   --  2.78*  --  2.18*  --   --   CREATININE 0.84  --   --   --   --   --   --    < > = values in this interval not displayed.    Estimated Creatinine Clearance: 40 mL/min (by C-G formula based on SCr of 0.84 mg/dL).   Medical History: Past Medical History:  Diagnosis Date  . Atrial fibrillation ([place])   . Bronchitis   . Cancer ([place])    Left leg growth, kidneys, lungs and breasts  . Carcinoma of unknown primary ([place])   . COPD (chronic obstructive pulmonary disease) ([place])   .  Diabetes mellitus without complication ([place])   . Hyperlipidemia   . Hypertension   . Pancreatitis   . Pneumonia   . Stroke ([place])    TIA's  . Vitamin B12 deficiency     Medications:  Medications Prior to Admission  Medication Sig Dispense Refill Last Dose  . amLODipine-benazepril (LOTREL) 5-20 MG capsule Take 1 capsule by mouth daily. 90 capsule 1 04/19/2020 at 0800  . b complex vitamins capsule Take 1 capsule by mouth daily.   04/19/2020 at 0800  . desloratadine (CLARINEX) 5 MG tablet Take 1 tablet (5 mg total) by mouth daily. 30 tablet 3 04/19/2020 at 0800  . ELIQUIS 2.5 MG TABS tablet TAKE ONE TABLET TWICE DAILY (Patient taking differently: Take 2.5 mg by mouth 2 (two) times daily. ) 60 tablet 3 04/19/2020 at 0800  . fluticasone furoate-vilanterol (BREO ELLIPTA) 100-25 MCG/INH AEPB TAKE 1 PUFF ONCE A DAY 60 each 3 04/18/2020 at 1200  . furosemide (LASIX) 20 MG tablet Take 1 tablet (  20 mg total) by mouth daily as needed for fluid or edema. 30 tablet 0 Past Week at prn  . gabapentin (NEURONTIN) 300 MG capsule Take 2 capsules (600 mg total) by mouth at bedtime. 60 capsule 5 04/18/2020 at 2000  . hydrochlorothiazide (HYDRODIURIL) 25 MG tablet Take 1 tablet (25 mg total) by mouth daily. 30 tablet 5 04/19/2020 at 0800  . Magnesium 250 MG TABS Take 250 mg by mouth daily at 6 (six) AM.   04/19/2020 at 0800  . metFORMIN (GLUCOPHAGE-XR) 500 MG 24 hr tablet Take 2 tablets (1,000 mg total) by mouth daily. 135 tablet 2 04/19/2020 at 0800  . montelukast (SINGULAIR) 10 MG tablet Take 10 mg by mouth at bedtime.   04/18/2020 at 2000  . nystatin cream (MYCOSTATIN) Apply topically 2 (two) times daily. 30 g 0 Past Week at Unknown time  . oxybutynin (DITROPAN-XL) 10 MG 24 hr tablet Take 1 tablet (10 mg total) by mouth daily. 30 tablet 3 04/19/2020 at 0800  . sodium chloride 1 g tablet Take 1 tablet (1 g total) by mouth 3 (three) times daily with meals.   04/19/2020 at 0800  . theophylline (THEO-24) 100 MG 24 hr capsule TAKE 1  CAPSULE BY MOUTH EVERY DAY (Patient taking differently: Take 100 mg by mouth daily. TAKE 1 CAPSULE BY MOUTH EVERY DAY) 30 capsule 3 04/19/2020 at 0800  . albuterol (VENTOLIN HFA) 108 (90 Base) MCG/ACT inhaler Inhale 2 puffs into the lungs every 4 (four) hours as needed for wheezing or shortness of breath. 1 Inhaler 5 unknown at prn  . glucose blood (ONETOUCH ULTRA) test strip 1 each by Other route daily. Use as instructed 100 each 3   . Liniments (SALONPAS PAIN RELIEF PATCH EX) Apply topically.   unknown at prn  . triamcinolone (KENALOG) 0.025 % cream Apply 1 application topically 2 (two) times daily. Ok to mix with topical lotion and apply to all affected areas. 80 g 3 unknown at prn    Assessment: Pharmacy consulted to dose heparin in this 86 year old female with DVT/AFib. Scheduled for surgical repair of fractured hip on 6/6.  Pt was on Eliquis 2.5 mg PO BID.   Last dose was on 6/5 @ 0800.  CrCl = 40 ml/min  06/06 @ 0530 aPTT 82 seconds therapeutic. 06/06 @ 1357 aPTT 95  Goal of Therapy:  Heparin level 0.3-0.7 units/ml aPTT 66 - 102  seconds Monitor platelets by anticoagulation protocol: Yes   Plan:  06/07 @ 0500 aPTT 85 seconds, HL still pending. Will continue current rate and will recheck aPTT/HL w/ am labs, CBC low but stable will continue to monitor.  Tobie Lords, PharmD, BCPS Clinical Pharmacist 04/21/2020 6:09 AM

## 2020-04-21 NOTE — Anesthesia Preprocedure Evaluation (Addendum)
Anesthesia Evaluation  Patient identified by MRN, date of birth, ID band Patient awake    Reviewed: Allergy & Precautions, NPO status , Patient's Chart, lab work & pertinent test results  History of Anesthesia Complications Negative for: history of anesthetic complications  Airway Mallampati: III       Dental  (+) Missing, Poor Dentition, Chipped   Pulmonary neg sleep apnea, COPD,  COPD inhaler, Not current smoker, former smoker,           Cardiovascular hypertension, Pt. on medications (-) Past MI and (-) CHF + dysrhythmias Atrial Fibrillation (-) Valvular Problems/Murmurs     Neuro/Psych neg Seizures CVA (visual difficulties, L facial weakness )    GI/Hepatic Neg liver ROS, neg GERD  ,  Endo/Other  diabetes, Type 2, Oral Hypoglycemic Agents  Renal/GU negative Renal ROS     Musculoskeletal   Abdominal   Peds  Hematology   Anesthesia Other Findings   Reproductive/Obstetrics                            Anesthesia Physical Anesthesia Plan  ASA: III and emergent  Anesthesia Plan: General   Post-op Pain Management:    Induction: Intravenous  PONV Risk Score and Plan: 3 and Ondansetron, Dexamethasone and Treatment may vary due to age or medical condition  Airway Management Planned: Oral ETT  Additional Equipment:   Intra-op Plan:   Post-operative Plan:   Informed Consent: I have reviewed the patients History and Physical, chart, labs and discussed the procedure including the risks, benefits and alternatives for the proposed anesthesia with the patient or authorized representative who has indicated his/her understanding and acceptance.       Plan Discussed with:   Anesthesia Plan Comments:         Anesthesia Quick Evaluation

## 2020-04-21 NOTE — Op Note (Signed)
04/21/2020  6:46 PM  Patient:   Monica [name]  Pre-Op Diagnosis:   Displaced femoral neck fracture, right hip.  Post-Op Diagnosis:   Same.  Procedure:   Right hip unipolar hemiarthroplasty.  Surgeon:   Pascal Lux, MD  Assistant:   Cameron Proud, PA-C; Kirkland Hun, PA-S  Anesthesia:   GET  Findings:   As above.  Complications:   None  EBL:   100 cc  Fluids:   750 cc crystalloid  UOP:   None  TT:   None  Drains:   None  Closure:   Staples  Implants:   Biomet press-fit system with a #12 laterally offset reduced proximal profile Echo femoral stem, a 43 mm outer diameter shell, and a -6 mm neck adapter.  Brief Clinical Note:   The patient is a 86 year old female who sustained the above-noted injury 2 days ago when she lost her balance and fell while making her bed. She was brought to the emergency room where x-rays demonstrated the above-noted injury. The patient has been cleared medically and presents at this time for definitive management of the injury.  Procedure:   The patient was brought into the operating room and lain in the supine position. After adequate general endotracheal intubation and anesthesia was obtained, the patient was repositioned in the left lateral decubitus position and secured using a lateral hip positioner. The right hip and lower extremity were prepped with ChloroPrep solution before being draped sterilely. Preoperative antibiotics were administered.   A timeout was performed to verify the appropriate surgical site before a standard posterior approach to the hip was made through an approximately 4-5 inch incision. The incision was carried down through the subcutaneous tissues to expose the gluteal fascia and proximal end of the iliotibial band. These structures were split the length of the incision and the Charnley self-retaining hip retractor placed. The bursal tissues were swept posteriorly to expose the short external rotators. The anterior  border of the piriformis tendon was identified and this plane developed down through the capsule to enter the joint. Abundant fracture hematoma was suctioned. A flap of tissue was elevated off the posterior aspect of the femoral neck and greater trochanter and retracted posteriorly. This flap included the piriformis tendon, the short external rotators, and the posterior capsule. The femoral head was removed in its entirety, then taken to the back table where it was measured and found to be optimally replicated by a 43 mm head. The appropriate trial head was inserted and found to demonstrate an excellent suction fit.   Attention was directed to the femoral side. The femoral neck was recut 10-12 mm above the lesser trochanter using an oscillating saw. The piriformis fossa was debrided of soft tissues before the intramedullary canal was accessed through this point using a triple step reamer. The canal was reamed sequentially beginning with a #7 tapered reamer and progressing to a #12 tapered reamer. This provided excellent circumferential chatter. A box osteotome was used to establish version before the canal was broached sequentially beginning with a #10 broach and progressing to a #12 broach. This was left in place and several trial reductions performed. The permanent #12 laterally offset reduced proximal profile femoral stem was impacted into place. A repeat trial reduction was performed using the -6 mm neck length. The -6 mm neck length demonstrated excellent stability both in extension and external rotation as well as with flexion to 90 and internal rotation beyond 70. It also was stable in the  position of sleep. The 43 mm outer diameter shell with the -6 mm neck adapter construct was put together on the back table before being impacted onto the stem of the femoral component. The Morse taper locking mechanism was verified using manual distraction before the head was relocated and the hip placed through a range  of motion with the findings as described above.  The wound was copiously irrigated with bacitracin saline solution via the jet lavage system before the peri-incisional and pericapsular tissues were injected with 30 cc of 0.5% Sensorcaine with epinephrine and 20 cc of Exparel diluted out to 60 cc with normal saline to help with postoperative analgesia. The posterior flap was reapproximated to the posterior aspect of the greater trochanter using #2 Tycron interrupted sutures placed through drill holes. The iliotibial band was reapproximated using #1 Vicryl interrupted sutures before the gluteal fascia was closed using a running #1 Vicryl suture. At this point, 1 g of transexemic acid in 10 cc of normal saline was injected into the joint to help reduce postoperative bleeding. The subcutaneous tissues were closed in several layers using 2-0 Vicryl interrupted sutures before the skin was closed using staples. A sterile occlusive dressing was applied to the wound . The patient then was rolled back into the supine position on the hospital bed before being awakened, extubated, and returned to the recovery room in satisfactory condition after tolerating the procedure well.

## 2020-04-22 LAB — BASIC METABOLIC PANEL
Anion gap: 7 (ref 5–15)
BUN: 25 mg/dL — ABNORMAL HIGH (ref 8–23)
CO2: 27 mmol/L (ref 22–32)
Calcium: 9.3 mg/dL (ref 8.9–10.3)
Chloride: 99 mmol/L (ref 98–111)
Creatinine, Ser: 1.11 mg/dL — ABNORMAL HIGH (ref 0.44–1.00)
GFR calc Af Amer: 50 mL/min — ABNORMAL LOW (ref >60)
GFR calc non Af Amer: 43 mL/min — ABNORMAL LOW (ref >60)
Glucose, Bld: 145 mg/dL — ABNORMAL HIGH (ref 70–99)
Potassium: 5.1 mmol/L (ref 3.5–5.1)
Sodium: 133 mmol/L — ABNORMAL LOW (ref 135–145)

## 2020-04-22 LAB — CBC
HCT: 32.3 % — ABNORMAL LOW (ref 36.0–46.0)
Hemoglobin: 9.3 g/dL — ABNORMAL LOW (ref 12.0–15.0)
MCH: 20.6 pg — ABNORMAL LOW (ref 26.0–34.0)
MCHC: 28.8 g/dL — ABNORMAL LOW (ref 30.0–36.0)
MCV: 71.5 fL — ABNORMAL LOW (ref 80.0–100.0)
Platelets: 319 10*3/uL (ref 150–400)
RBC: 4.52 MIL/uL (ref 3.87–5.11)
RDW: 19.3 % — ABNORMAL HIGH (ref 11.5–15.5)
WBC: 10 10*3/uL (ref 4.0–10.5)
nRBC: 0 % (ref 0.0–0.2)

## 2020-04-22 LAB — GLUCOSE, CAPILLARY
Glucose-Capillary: 133 mg/dL — ABNORMAL HIGH (ref 70–99)
Glucose-Capillary: 156 mg/dL — ABNORMAL HIGH (ref 70–99)
Glucose-Capillary: 175 mg/dL — ABNORMAL HIGH (ref 70–99)
Glucose-Capillary: 150 mg/dL — ABNORMAL HIGH (ref 70–99)

## 2020-04-22 LAB — APTT: aPTT: 30 seconds (ref 24–36)

## 2020-04-22 MED ORDER — POLYETHYLENE GLYCOL 3350 17 G PO PACK
17.0000 g | PACK | Freq: Two times a day (BID) | ORAL | Status: DC
  Administered 2020-04-22 – 2020-04-23 (×2): 17 g via ORAL
  Filled 2020-04-22 (×2): qty 1

## 2020-04-22 MED ORDER — SODIUM CHLORIDE 0.9 % IV SOLN
INTRAVENOUS | Status: DC | PRN
  Administered 2020-04-22: 250 mL via INTRAVENOUS

## 2020-04-22 NOTE — NC FL2 (Signed)
[place] LEVEL OF CARE SCREENING TOOL     IDENTIFICATION  Patient Name: Monica [name] Birthdate: [birth year] Sex: female Admission Date (Current Location): 04/19/2020  [facility] and [place] Number:      Facility and Address:  [facility], [address], [place], [place] [address]      Provider Number: [id number]  Attending Physician Name and Address:  Enzo Bi, MD  Relative Name and Phone Number:  [name] [phone]    Current Level of Care: Hospital Recommended Level of Care: [place] Prior Approval Number:    Date Approved/Denied:   PASRR Number: [id number]  Discharge Plan: SNF    Current Diagnoses: Patient Active Problem List   Diagnosis Date Noted   Fracture of femoral neck, right ([place]) 04/19/2020   Diabetes mellitus without complication ([place]) 15/40/0867   Fall at home, initial encounter 04/19/2020   Supratherapeutic INR 11/22/2019   Scalp hematoma, subsequent encounter 11/17/2019   Scalp laceration, subsequent encounter 10/26/2019   Muscle cramping 10/26/2019   Long term current use of anticoagulant therapy 10/26/2019   Encounter for general adult medical examination with abnormal findings 08/21/2019   Cellulitis of right buttock 07/25/2019   Diabetic neuropathy ([place]) 06/25/2019   Non-insulin dependent type 2 diabetes mellitus ([place]) 06/25/2019   Pressure injury of skin 06/05/2019   'light-for-dates' infant with signs of fetal malnutrition 05/31/2019   Closed left hip fracture ([place]) 05/31/2019   Inflammatory polyarthritis ([place]) 05/09/2019   Deep vein thrombosis (DVT) of non-extremity vein 11/29/2018   Edema of left lower extremity 11/13/2018   Cellulitis of left lower leg 11/01/2018   Encounter for therapeutic drug level monitoring 07/05/2018   Allergic contact dermatitis 04/19/2018   Atopic dermatitis 03/15/2018   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[place]) 03/15/2018   Atrial fibrillation,  chronic ([place]) 02/12/2018   Melena    Urinary tract infection with hematuria 01/04/2018   Chronic obstructive pulmonary disease ([place]) 12/27/2017   Acute cystitis with hematuria 12/27/2017   Dysuria 12/27/2017   Urinary frequency 12/27/2017   Uncontrolled type 2 diabetes mellitus with hyperglycemia ([place]) 12/27/2017   Iron deficiency anemia due to chronic blood loss 09/28/2016   Avitaminosis D 09/09/2016   B12 deficiency 09/09/2016   Temporary cerebral vascular dysfunction 09/09/2016   Spinal stenosis 09/09/2016   Pain in shoulder 09/09/2016   Restless legs syndrome 09/09/2016   Personal history of urinary calculi 09/09/2016   H/O deep venous thrombosis 09/09/2016   Gout 09/09/2016   Accumulation of fluid in tissues 09/09/2016   Carpal tunnel syndrome 09/09/2016   Chronic lung disease 09/09/2016   Cataract 09/09/2016   Appendicular ataxia 09/09/2016   Airway hyperreactivity 09/09/2016   Rectal bleeding    Acute blood loss anemia    Gastrointestinal hemorrhage 08/08/2016   Carcinoma of unknown primary ([place]) 08/02/2016   Diabetes ([place]) 05/08/2015   Arthropathy of hand 02/04/2010   Hypertonicity of bladder 08/31/2009   Detrusor instability of bladder 06/18/2009   Pure hypercholesterolemia 04/10/2009   Essential hypertension 04/10/2009   Diverticulitis of colon 04/10/2009    Orientation RESPIRATION BLADDER Height & Weight     Self, Time, Situation, Place  Normal External catheter Weight: 66.7 kg Height:  5\' 3"  (160 cm)  BEHAVIORAL SYMPTOMS/MOOD NEUROLOGICAL BOWEL NUTRITION STATUS      Continent Diet(carb modified, heart healthy)  AMBULATORY STATUS COMMUNICATION OF NEEDS Skin   Extensive Assist Verbally Surgical wounds  Personal Care Assistance Level of Assistance  Bathing, Dressing Bathing Assistance: Limited assistance   Dressing Assistance: Limited assistance     Functional Limitations Info              SPECIAL CARE FACTORS FREQUENCY  PT (By licensed PT)     PT Frequency: 5 times per week              Contractures Contractures Info: Not present    Additional Factors Info  Code Status, Allergies Code Status Info: full code Allergies Info: Sulfa Antibiotics, Celecoxib, Acetaminophen, Codeine, Lyrica Penicillin G, Petrolatum-zinc Oxide           Current Medications (04/22/2020):  This is the current hospital active medication list Current Facility-Administered Medications  Medication Dose Route Frequency Provider Last Rate Last Admin   0.9 %  sodium chloride infusion   Intravenous PRN Lorella Nimrod, MD 10 mL/hr at 04/22/20 0032 250 mL at 04/22/20 0032   albuterol (VENTOLIN HFA) 108 (90 Base) MCG/ACT inhaler 1 puff  1 puff Inhalation Q4H PRN Poggi, Marshall Cork, MD       amLODipine (NORVASC) tablet 5 mg  5 mg Oral Daily Poggi, Marshall Cork, MD   5 mg at 04/22/20 5852   And   benazepril (LOTENSIN) tablet 20 mg  20 mg Oral Daily Poggi, Marshall Cork, MD   20 mg at 04/22/20 0911   apixaban (ELIQUIS) tablet 2.5 mg  2.5 mg Oral BID Corky Mull, MD   2.5 mg at 04/22/20 0910   bisacodyl (DULCOLAX) suppository 10 mg  10 mg Rectal Daily PRN Poggi, Marshall Cork, MD       ceFAZolin (ANCEF) IVPB 2g/100 mL premix  2 g Intravenous Q6H Poggi, Marshall Cork, MD 200 mL/hr at 04/22/20 0703 2 g at 04/22/20 0703   dextromethorphan-guaiFENesin ([place] DM) 30-600 MG per 12 hr tablet 1 tablet  1 tablet Oral BID PRN Poggi, Marshall Cork, MD       diphenhydrAMINE (BENADRYL) 12.5 MG/5ML elixir 12.5-25 mg  12.5-25 mg Oral Q4H PRN Poggi, Marshall Cork, MD       docusate sodium (COLACE) capsule 100 mg  100 mg Oral BID Poggi, Marshall Cork, MD   100 mg at 04/22/20 0910   feeding supplement (ENSURE ENLIVE) (ENSURE ENLIVE) liquid 237 mL  237 mL Oral BID BM Poggi, Marshall Cork, MD   237 mL at 04/20/20 1515   fluticasone furoate-vilanterol (BREO ELLIPTA) 100-25 MCG/INH 1 puff  1 puff Inhalation Daily Poggi, Marshall Cork, MD   1 puff at 04/22/20 0910    gabapentin (NEURONTIN) capsule 600 mg  600 mg Oral QHS Poggi, Marshall Cork, MD   600 mg at 04/21/20 2255   hydrALAZINE (APRESOLINE) injection 5 mg  5 mg Intravenous Q2H PRN Poggi, Marshall Cork, MD   5 mg at 04/20/20 1735   hydrochlorothiazide (HYDRODIURIL) tablet 25 mg  25 mg Oral Daily Poggi, Marshall Cork, MD   25 mg at 04/22/20 0910   insulin aspart (novoLOG) injection 0-5 Units  0-5 Units Subcutaneous QHS Corky Mull, MD   2 Units at 04/20/20 2211   insulin aspart (novoLOG) injection 0-9 Units  0-9 Units Subcutaneous TID WC Poggi, Marshall Cork, MD   1 Units at 04/22/20 0909   loratadine (CLARITIN) tablet 10 mg  10 mg Oral Daily Poggi, Marshall Cork, MD   10 mg at 04/22/20 0910   magnesium hydroxide (MILK OF MAGNESIA) suspension 30 mL  30 mL Oral Daily PRN Poggi, Marshall Cork, MD  magnesium oxide (MAG-OX) tablet 200 mg  200 mg Oral Q0600 Poggi, Marshall Cork, MD   200 mg at 04/22/20 0700   methocarbamol (ROBAXIN) tablet 500 mg  500 mg Oral Q8H PRN Poggi, Marshall Cork, MD   500 mg at 04/19/20 2115   metoCLOPramide (REGLAN) tablet 5-10 mg  5-10 mg Oral Q8H PRN Poggi, Marshall Cork, MD       Or   metoCLOPramide (REGLAN) injection 5-10 mg  5-10 mg Intravenous Q8H PRN Poggi, Marshall Cork, MD       montelukast (SINGULAIR) tablet 10 mg  10 mg Oral QHS Poggi, Marshall Cork, MD   10 mg at 04/21/20 2255   morphine 2 MG/ML injection 0.5 mg  0.5 mg Intravenous Q3H PRN Poggi, Marshall Cork, MD       multivitamin with minerals tablet 1 tablet  1 tablet Oral Daily Poggi, Marshall Cork, MD   1 tablet at 04/22/20 0910   ondansetron (ZOFRAN) tablet 4 mg  4 mg Oral Q6H PRN Poggi, Marshall Cork, MD       Or   ondansetron (ZOFRAN) injection 4 mg  4 mg Intravenous Q6H PRN Poggi, Marshall Cork, MD       oxybutynin (DITROPAN-XL) 24 hr tablet 10 mg  10 mg Oral Daily Poggi, Marshall Cork, MD   10 mg at 04/22/20 5945   oxyCODONE (Oxy IR/ROXICODONE) immediate release tablet 5 mg  5 mg Oral Q6H PRN Poggi, Marshall Cork, MD   5 mg at 04/22/20 8592   senna-docusate (Senokot-S) tablet 1 tablet  1 tablet Oral  QHS PRN Poggi, Marshall Cork, MD       sodium chloride tablet 1 g  1 g Oral TID WC Poggi, Marshall Cork, MD   1 g at 04/22/20 0917   sodium phosphate (FLEET) 7-19 GM/118ML enema 1 enema  1 enema Rectal Once PRN Poggi, Marshall Cork, MD       theophylline (THEO-24) 24 hr capsule 100 mg  100 mg Oral Daily Poggi, Marshall Cork, MD   100 mg at 04/22/20 9244     Discharge Medications: Please see discharge summary for a list of discharge medications.  Relevant Imaging Results:  Relevant Lab Results:   Additional Information SSN: [id number]  Su Hilt, RN

## 2020-04-22 NOTE — Progress Notes (Signed)
PROGRESS NOTE    Monica [name]  HAL:[id number] DOB: 23-Jul-1926 DOA: 04/19/2020 PCP: Monica Freshwater, NP   Brief Narrative:  Monica [name] is a 86 y.o. 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 X-ray with right femoral neck fracture.  Going for surgery on Monday as needed Eliquis clearance.  Subjective: Pt reported pain only when moving.  Did not eat much, but drank enough fluids.  Mild tenderness over lower abdomen.  No BM since presentation.  No fever, dyspnea, chest pain, N/V/D.   Assessment & Plan:   Principal Problem:   Fracture of femoral neck, right (HCC) Active Problems:   H/O deep venous thrombosis   Essential hypertension   Iron deficiency anemia due to chronic blood loss   Chronic obstructive pulmonary disease (HCC)   Atrial fibrillation, chronic (HCC)   Diabetes mellitus without complication ([place])   Fall at home, initial encounter  Fracture of femoral neck, right ([place]) s/p HEMIARTHROPLASTY on 04/21/20 -resume Eliquis. -Continue with pain management. -PT/OT and rehab SNF  H/O deep venous thrombosis -continue Eliquis  HTN:  -Continue home medications: Lotrel, HCTC,  -hydralazine prn  Chronic anemia . Hgb stable, 9.7 -Check anemia panel.  Chronic obstructive pulmonary disease ([place]): stable -Continue theophylline, Singulair and bronchodilators  Atrial fibrillation, chronic (HCC) --continue Eliquis  Diabetes mellitus without complication ([place]): Most recent A1c 6.5, controled. Patient is taking Metformin at home -SSI  Fall at home, initial encounter -pt/ot when able to  Leg edema: pt has trace leg edema.  She is on as needed Lasix.  No history of CHF -Hold Lasix   Objective: Vitals:   04/22/20 0402 04/22/20 0809 04/22/20 1148  04/22/20 1648  BP: (!) 116/41 (!) 132/44 (!) 113/54 (!) 151/66  Pulse: 74 71 75 92  Resp: 18 16 16 18   Temp: 98 F (36.7 C) (!) 97.5 F (36.4 C) 98.1 F (36.7 C) 100 F (37.8 C)  TempSrc:      SpO2: 96% 99% 92% 91%  Weight:      Height:        Intake/Output Summary (Last 24 hours) at 04/22/2020 1914 Last data filed at 04/22/2020 1830 Gross per 24 hour  Intake 650 ml  Output 325 ml  Net 325 ml   Filed Weights   04/19/20 1155 04/21/20 1559  Weight: 66.7 kg 66.7 kg    Examination:  Constitutional: NAD, AAOx3 HEENT: conjunctivae and lids normal, EOMI CV: RRR no M,R,G. Distal pulses +2.  No cyanosis.   RESP: CTA B/L, normal respiratory effort  GI: +BS, NTND Extremities: No effusions, edema, or tenderness in BLE SKIN: warm, dry and intact Neuro: II - XII grossly intact.  Sensation intact Psych: Normal mood and affect.  Appropriate judgement and reason    DVT prophylaxis: Eliquis Code Status: Full Family Communication:  Disposition Plan:  Status is: Inpatient  Remains inpatient appropriate because:Inpatient level of care appropriate due to severity of illness   Dispo: The patient is from: Home              Anticipated d/c is to: SNF              Anticipated d/c date is: 2 days when bed available              Patient currently is medically stable to d/c.  Consultants:   Orthopedic  Procedures:  Antimicrobials:   Data Reviewed: I have personally reviewed following labs and imaging studies  CBC: Recent Labs  Lab 04/19/20 1252 04/20/20 1357 04/21/20 0419 04/22/20 0416  WBC 8.9 10.3 9.6 10.0  NEUTROABS 6.5  --   --   --   HGB 9.2* 9.7* 9.1* 9.3*  HCT 31.6* 33.0* 29.7* 32.3*  MCV 70.1* 70.8* 68.0* 71.5*  PLT 327 310 327 604   Basic Metabolic Panel: Recent Labs  Lab 04/19/20 1252 04/22/20 0416  NA 137 133*  K 4.1 5.1  CL 101 99  CO2 27 27  GLUCOSE 151* 145*  BUN 19 25*  CREATININE 0.84 1.11*  CALCIUM 9.3 9.3   GFR: Estimated Creatinine  Clearance: 30.3 mL/min (A) (by C-G formula based on SCr of 1.11 mg/dL (H)). Liver Function Tests: No results for input(s): AST, ALT, ALKPHOS, BILITOT, PROT, ALBUMIN in the last 168 hours. No results for input(s): LIPASE, AMYLASE in the last 168 hours. No results for input(s): AMMONIA in the last 168 hours. Coagulation Profile: Recent Labs  Lab 04/19/20 1252  INR 1.2   Cardiac Enzymes: No results for input(s): CKTOTAL, CKMB, CKMBINDEX, TROPONINI in the last 168 hours. BNP (last 3 results) No results for input(s): PROBNP in the last 8760 hours. HbA1C: No results for input(s): HGBA1C in the last 72 hours. CBG: Recent Labs  Lab 04/21/20 1859 04/21/20 2302 04/22/20 0810 04/22/20 1137 04/22/20 1649  GLUCAP 122* 159* 133* 156* 175*   Lipid Profile: No results for input(s): CHOL, HDL, LDLCALC, TRIG, CHOLHDL, LDLDIRECT in the last 72 hours. Thyroid Function Tests: No results for input(s): TSH, T4TOTAL, FREET4, T3FREE, THYROIDAB in the last 72 hours. Anemia Panel: Recent Labs    04/20/20 0528 04/20/20 1357  FOLATE 9.8  --   FERRITIN 9*  --   TIBC 414  --   IRON 23*  --   RETICCTPCT  --  1.3   Sepsis Labs: No results for input(s): PROCALCITON, LATICACIDVEN in the last 168 hours.  Recent Results (from the past 240 hour(s))  SARS Coronavirus 2 by RT PCR (hospital order, performed in [facility] hospital lab) Nasopharyngeal Nasopharyngeal Swab     Status: None   Collection Time: 04/19/20  2:35 PM   Specimen: Nasopharyngeal Swab  Result Value Ref Range Status   SARS Coronavirus 2 NEGATIVE NEGATIVE Final    Comment: (NOTE) SARS-CoV-2 target nucleic acids are NOT DETECTED. The SARS-CoV-2 RNA is generally detectable in upper and lower respiratory specimens during the acute phase of infection. The lowest concentration of SARS-CoV-2 viral copies this assay can detect is 250 copies / mL. A negative result does not preclude SARS-CoV-2 infection and should not be used as the sole  basis for treatment or other patient management decisions.  A negative result may occur with improper specimen collection / handling, submission of specimen other than nasopharyngeal swab, presence of viral mutation(s) within the areas targeted by this assay, and inadequate number of viral copies (<250 copies / mL). A negative result must be combined with clinical observations, patient history, and epidemiological information. Fact Sheet for Patients:   StrictlyIdeas.no Fact Sheet for Healthcare Providers: BankingDealers.co.za This test is not yet approved or cleared  by the [place] FDA and has been authorized for detection and/or diagnosis of SARS-CoV-2 by FDA under an Emergency Use Authorization (EUA).  This EUA will remain in effect (meaning this test can be used) for the duration of the COVID-19 declaration under Section 564(b)(1)  of the Act, 21 U.S.C. section 360bbb-3(b)(1), unless the authorization is terminated or revoked sooner. Performed at [facility], [address] [place], [place] [address]   MRSA PCR Screening     Status: None   Collection Time: 04/19/20  7:30 PM   Specimen: Nasopharyngeal  Result Value Ref Range Status   MRSA by PCR NEGATIVE NEGATIVE Final    Comment:        The GeneXpert MRSA Assay (FDA approved for NASAL specimens only), is one component of a comprehensive MRSA colonization surveillance program. It is not intended to diagnose MRSA infection nor to guide or monitor treatment for MRSA infections. Performed at [facility], [address] [place], [place] [address]      Radiology Studies: DG HIP UNILAT W OR W/O PELVIS 2-3 VIEWS RIGHT  Result Date: 04/21/2020 CLINICAL DATA:  Postop right hip hemiarthroplasty EXAM: DG HIP (WITH OR WITHOUT PELVIS) 2-3V RIGHT COMPARISON:  04/19/2020 FINDINGS: Frontal view of the pelvis as well as cross-table lateral views of the right hip  are obtained. Interval placement of right hip hemiarthroplasty in the expected position without signs of acute complication. There are postsurgical changes in the overlying soft tissues. Stable left hip hemiarthroplasty. IMPRESSION: 1. Right hip hemiarthroplasty as above. Electronically Signed   By: Randa Ngo M.D.   On: 04/21/2020 20:19    Scheduled Meds: . amLODipine  5 mg Oral Daily   And  . benazepril  20 mg Oral Daily  . apixaban  2.5 mg Oral BID  . docusate sodium  100 mg Oral BID  . feeding supplement (ENSURE ENLIVE)  237 mL Oral BID BM  . fluticasone furoate-vilanterol  1 puff Inhalation Daily  . gabapentin  600 mg Oral QHS  . hydrochlorothiazide  25 mg Oral Daily  . insulin aspart  0-5 Units Subcutaneous QHS  . insulin aspart  0-9 Units Subcutaneous TID WC  . loratadine  10 mg Oral Daily  . magnesium oxide  200 mg Oral Q0600  . montelukast  10 mg Oral QHS  . multivitamin with minerals  1 tablet Oral Daily  . oxybutynin  10 mg Oral Daily  . sodium chloride  1 g Oral TID WC  . theophylline  100 mg Oral Daily   Continuous Infusions: . sodium chloride 250 mL (04/22/20 0032)     LOS: 3 days    Enzo Bi, MD [facility]  If 7PM-7AM, please contact night-coverage [email or web address]  04/22/2020, 7:14 PM   This record has been created using Systems analyst. Errors have been sought and corrected,but may not always be located. Such creation errors do not reflect on the standard of care.

## 2020-04-22 NOTE — TOC Progression Note (Addendum)
Transition of Care [facility]) - Progression Note    Patient Details  Name: Monica [name] MRN: [id number] Date of Birth: 06/26/1926  Transition of Care [facility]) CM/SW [place], RN Phone Number: 04/22/2020, 11:19 AM  Clinical Narrative:    Monica [name], PASSR and bed search complete, will review bed offers once obtained, the patient has had her covid vaccines and has been to [facility] in the past and would like to go there again        Expected Discharge Plan and Services                                                 Social Determinants of Health (SDOH) Interventions    Readmission Risk Interventions Readmission Risk Prevention Plan 06/08/2019 06/08/2019  Transportation Screening Complete Complete  PCP or Specialist Appt within 3-5 Days Complete Complete  HRI or [place] - Complete  Social Work Consult for [place] Planning/Counseling Complete Complete  Palliative Care Screening Not Applicable Not Applicable  Medication Review Press photographer) Complete Complete  Some recent data might be hidden

## 2020-04-22 NOTE — Progress Notes (Signed)
  Subjective: 1 Day Post-Op Procedure(s) (LRB): ARTHROPLASTY BIPOLAR HIP (HEMIARTHROPLASTY) (Right) Patient reports pain as mild.   Patient is well, and has had no acute complaints or problems PT and care management to assist with discharge planning. Negative for chest pain and shortness of breath Fever: no Gastrointestinal:Negative for nausea and vomiting  Objective: Vital signs in last 24 hours: Temp:  [97.5 F (36.4 C)-98.3 F (36.8 C)] 97.5 F (36.4 C) (06/08 0809) Pulse Rate:  [64-89] 71 (06/08 0809) Resp:  [15-27] 16 (06/08 0809) BP: (107-152)/(39-62) 132/44 (06/08 0809) SpO2:  [89 %-100 %] 99 % (06/08 0809) Weight:  [66.7 kg] 66.7 kg (06/07 1559)  Intake/Output from previous day:  Intake/Output Summary (Last 24 hours) at 04/22/2020 1011 Last data filed at 04/22/2020 0525 Gross per 24 hour  Intake 1706.02 ml  Output 425 ml  Net 1281.02 ml    Intake/Output this shift: No intake/output data recorded.  Labs: Recent Labs    04/19/20 1252 04/20/20 1357 04/21/20 0419 04/22/20 0416  HGB 9.2* 9.7* 9.1* 9.3*   Recent Labs    04/21/20 0419 04/22/20 0416  WBC 9.6 10.0  RBC 4.37 4.52  HCT 29.7* 32.3*  PLT 327 319   Recent Labs    04/19/20 1252 04/22/20 0416  NA 137 133*  K 4.1 5.1  CL 101 99  CO2 27 27  BUN 19 25*  CREATININE 0.84 1.11*  GLUCOSE 151* 145*  CALCIUM 9.3 9.3   Recent Labs    04/19/20 1252  INR 1.2     EXAM General - Patient is Alert, Appropriate and Oriented Extremity - ABD soft Neurovascular intact Sensation intact distally Intact pulses distally Dorsiflexion/Plantar flexion intact Incision: dressing C/D/I No cellulitis present Dressing/Incision - clean, dry, no drainage Motor Function - intact, moving foot and toes well on exam.  Abdomen soft with normal BS this AM.  Past Medical History:  Diagnosis Date  . Atrial fibrillation ([place])   . Bronchitis   . Cancer ([place])    Left leg growth, kidneys, lungs and breasts  .  Carcinoma of unknown primary ([place])   . COPD (chronic obstructive pulmonary disease) ([place])   . Diabetes mellitus without complication ([place])   . Hyperlipidemia   . Hypertension   . Pancreatitis   . Pneumonia   . Stroke ([place])    TIA's  . Vitamin B12 deficiency     Assessment/Plan: 1 Day Post-Op Procedure(s) (LRB): ARTHROPLASTY BIPOLAR HIP (HEMIARTHROPLASTY) (Right) Principal Problem:   Fracture of femoral neck, right (HCC) Active Problems:   H/O deep venous thrombosis   Essential hypertension   Iron deficiency anemia due to chronic blood loss   Chronic obstructive pulmonary disease (HCC)   Atrial fibrillation, chronic (HCC)   Diabetes mellitus without complication ([place])   Fall at home, initial encounter  Estimated body mass index is 26.05 kg/m as calculated from the following:   Height as of this encounter: 5\' 3"  (1.6 m).   Weight as of this encounter: 66.7 kg. Advance diet Up with therapy D/C IV fluids when tolerating po intake.  Labs reviewed this AM. Hg 9.3. Patient reports she is passing gas.  Work on [employer] Up with PT today.  Care management to assist with discharge planning. CBC and BMP ordered for tomorrow morning.  DVT Prophylaxis - Foot Pumps, TED hose and Eliquis Weight-Bearing as tolerated to right leg  J. Cameron Proud, PA-C [facility] 04/22/2020, 10:11 AM
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
## 2020-04-22 NOTE — Progress Notes (Signed)
Physical Therapy Treatment Patient Details Name: Monica [name] MRN: [id number] DOB: 1926-02-16 Today's Date: 04/22/2020    History of Present Illness presented to ER secodnary to mechanical fall with acute onset of R hip pain in home environment; admitted for management of displaced R femoral neck fracture, s/p R hip unipolar hemiarthroplasty (6/7), WBAT, posterior approach.    PT Comments    Patient seen in PM session for return to bed; tolerated OOB to chair for approx 3 hours this date.  Continues to require total assist +2 with limited active assist, limited attempts at [facility] appreciated during transfer; however, appears more comfortable, less guarded overall with transfer.      Follow Up Recommendations  SNF     Equipment Recommendations       Recommendations for Other Services       Precautions / Restrictions Precautions Precautions: Fall;Posterior Hip Restrictions Weight Bearing Restrictions: Yes RLE Weight Bearing: Weight bearing as tolerated    Mobility  Bed Mobility Overal bed mobility: Needs Assistance Bed Mobility: Sit to Supine       Sit to supine: Total assist;+2 for physical assistance      Transfers Overall transfer level: Needs assistance   Transfers: Lateral/Scoot Transfers          Lateral/Scoot Transfers: Total assist;+2 physical assistance General transfer comment: limited active assist with movement transition, though less pain noted this PM  Ambulation/Gait                 Stairs             Wheelchair Mobility    Modified Rankin (Stroke Patients Only)       Balance                                            Cognition   Behavior During Therapy: WFL for tasks assessed/performed Overall Cognitive Status: Within Functional Limits for tasks assessed                                        Exercises      General Comments        Pertinent Vitals/Pain Pain Assessment:  Faces Faces Pain Scale: Hurts even more Pain Location: R hip Pain Descriptors / Indicators: Aching;Guarding;Grimacing Pain Intervention(s): Monitored during session;Repositioned;Limited activity within patient's tolerance    Home Living                      Prior Function            PT Goals (current goals can now be found in the care plan section) Acute Rehab PT Goals Patient Stated Goal: "to be done with you" PT Goal Formulation: With patient/family Time For Goal Achievement: 05/06/20 Potential to Achieve Goals: Fair Progress towards PT goals: Progressing toward goals    Frequency    7X/week      PT Plan Current plan remains appropriate    Co-evaluation              AM-PAC PT "6 Clicks" Mobility   Outcome Measure  Help needed turning from your back to your side while in a flat bed without using bedrails?: A Lot Help needed moving from lying on your back to sitting on the side of  a flat bed without using bedrails?: Total Help needed moving to and from a bed to a chair (including a wheelchair)?: Total Help needed standing up from a chair using your arms (e.g., wheelchair or bedside chair)?: Total Help needed to walk in hospital room?: Total Help needed climbing 3-5 steps with a railing? : Total 6 Click Score: 7    End of Session   Activity Tolerance: Patient tolerated treatment well Patient left: in bed;with call bell/phone within reach;with bed alarm set Nurse Communication: Mobility status PT Visit Diagnosis: Muscle weakness (generalized) (M62.81);Pain;Other abnormalities of gait and mobility (R26.89) Pain - Right/Left: Right Pain - part of body: Hip     Time: 1240-1250 PT Time Calculation (min) (ACUTE ONLY): 10 min  Charges:  $Therapeutic Activity: 8-22 mins                     Charlesetta Milliron H. Owens Shark, PT, DPT, NCS 04/22/20, 4:14 PM [phone]

## 2020-04-23 LAB — CBC
HCT: 31.1 % — ABNORMAL LOW (ref 36.0–46.0)
Hemoglobin: 9.2 g/dL — ABNORMAL LOW (ref 12.0–15.0)
MCH: 20.9 pg — ABNORMAL LOW (ref 26.0–34.0)
MCHC: 29.6 g/dL — ABNORMAL LOW (ref 30.0–36.0)
MCV: 70.5 fL — ABNORMAL LOW (ref 80.0–100.0)
Platelets: 300 10*3/uL (ref 150–400)
RBC: 4.41 MIL/uL (ref 3.87–5.11)
RDW: 18.8 % — ABNORMAL HIGH (ref 11.5–15.5)
WBC: 10.6 10*3/uL — ABNORMAL HIGH (ref 4.0–10.5)
nRBC: 0 % (ref 0.0–0.2)

## 2020-04-23 LAB — BASIC METABOLIC PANEL
Anion gap: 11 (ref 5–15)
BUN: 25 mg/dL — ABNORMAL HIGH (ref 8–23)
CO2: 26 mmol/L (ref 22–32)
Calcium: 9.4 mg/dL (ref 8.9–10.3)
Chloride: 93 mmol/L — ABNORMAL LOW (ref 98–111)
Creatinine, Ser: 0.96 mg/dL (ref 0.44–1.00)
GFR calc Af Amer: 60 mL/min — ABNORMAL LOW (ref >60)
GFR calc non Af Amer: 52 mL/min — ABNORMAL LOW (ref >60)
Glucose, Bld: 154 mg/dL — ABNORMAL HIGH (ref 70–99)
Potassium: 5.1 mmol/L (ref 3.5–5.1)
Sodium: 130 mmol/L — ABNORMAL LOW (ref 135–145)

## 2020-04-23 LAB — MAGNESIUM: Magnesium: 2.1 mg/dL (ref 1.7–2.4)

## 2020-04-23 LAB — GLUCOSE, CAPILLARY
Glucose-Capillary: 153 mg/dL — ABNORMAL HIGH (ref 70–99)
Glucose-Capillary: 202 mg/dL — ABNORMAL HIGH (ref 70–99)
Glucose-Capillary: 152 mg/dL — ABNORMAL HIGH (ref 70–99)
Glucose-Capillary: 164 mg/dL — ABNORMAL HIGH (ref 70–99)

## 2020-04-23 LAB — SURGICAL PATHOLOGY

## 2020-04-23 LAB — SARS CORONAVIRUS 2 BY RT PCR (HOSPITAL ORDER, PERFORMED IN ~~LOC~~ HOSPITAL LAB): SARS Coronavirus 2: NEGATIVE

## 2020-04-23 MED ORDER — METHOCARBAMOL 500 MG PO TABS: 500.0000 mg | ORAL_TABLET | Freq: Three times a day (TID) | ORAL | Status: DC | PRN

## 2020-04-23 MED ORDER — ENSURE ENLIVE PO LIQD: 237.0000 mL | Freq: Two times a day (BID) | ORAL | 12 refills | Status: DC

## 2020-04-23 MED ORDER — POLYETHYLENE GLYCOL 3350 17 G PO PACK
34.0000 g | PACK | ORAL | Status: AC
  Administered 2020-04-23 (×3): 34 g via ORAL
  Filled 2020-04-23 (×3): qty 2

## 2020-04-23 MED ORDER — ADULT MULTIVITAMIN W/MINERALS CH: 1.0000 | ORAL_TABLET | Freq: Every day | ORAL | Status: DC

## 2020-04-23 MED ORDER — DOCUSATE SODIUM 100 MG PO CAPS: 100.0000 mg | ORAL_CAPSULE | Freq: Two times a day (BID) | ORAL | 0 refills | Status: DC | PRN

## 2020-04-23 MED ORDER — SENNOSIDES-DOCUSATE SODIUM 8.6-50 MG PO TABS: 1.0000 | ORAL_TABLET | Freq: Every evening | ORAL | Status: DC | PRN

## 2020-04-23 MED ORDER — IRON 28 MG PO TABS: 1.0000 | ORAL_TABLET | Freq: Every day | ORAL | Status: DC

## 2020-04-23 MED ORDER — OXYCODONE HCL 5 MG PO TABS: 5.0000 mg | ORAL_TABLET | Freq: Four times a day (QID) | ORAL | 0 refills | Status: DC | PRN

## 2020-04-23 NOTE — Progress Notes (Signed)
Physical Therapy Treatment Patient Details Name: Monica [name] MRN: [id number] DOB: 1926-01-01 Today's Date: 04/23/2020    History of Present Illness presented to ER secodnary to mechanical fall with acute onset of R hip pain in home environment; admitted for management of displaced R femoral neck fracture, s/p R hip unipolar hemiarthroplasty (6/7), WBAT, posterior approach.    PT Comments    Extensive education and encouragement for participation with session; remains generally agitated with therapist and mobility requests throughout session.  Very self-limiting in active performance and participation with session, requiring max/total assist +2 and refusing activities beyond sit/stand this date.   Of note, feel participation and overall progress may be better with consistent pain control (and routine pre-medication prior to sessions).  Will review with RN and plan for review prior to next session.  Do anticipate very gradual progress with skilled PT interventions.    Follow Up Recommendations  SNF     Equipment Recommendations       Recommendations for Other Services       Precautions / Restrictions Precautions Precautions: Fall;Posterior Hip Restrictions Weight Bearing Restrictions: Yes RLE Weight Bearing: Weight bearing as tolerated    Mobility  Bed Mobility Overal bed mobility: Needs Assistance Bed Mobility: Sit to Supine;Supine to Sit Rolling: Total assist;+2 for physical assistance   Supine to sit: Total assist;+2 for physical assistance     General bed mobility comments: poor ability to actively assist due to guarding, limited active effort, nearly refusing active participation with session; poor dissociation of LEs and trunk  Transfers Overall transfer level: Needs assistance Equipment used: Rolling walker (2 wheeled) Transfers: Sit to/from Stand x2 Sit to Stand: Max assist;+2 physical assistance         General transfer comment: cuing for hand placement,  lift off from elevated seated surface.  Broad BOS. Poor standing posture/extension, unable to acheive fully upright stance  Ambulation/Gait             General Gait Details: patient refusing attempts at this time   Marine scientist Rankin (Stroke Patients Only)       Balance                                            Cognition Arousal/Alertness: Awake/alert Behavior During Therapy: Agitated Overall Cognitive Status: Within Functional Limits for tasks assessed                                        Exercises      General Comments        Pertinent Vitals/Pain Pain Assessment: Faces Faces Pain Scale: Hurts whole lot Pain Location: R hip Pain Descriptors / Indicators: Aching;Guarding;Grimacing Pain Intervention(s): Limited activity within patient's tolerance;Monitored during session;Repositioned;Patient requesting pain meds-RN notified;RN gave pain meds during session    Home Living                      Prior Function            PT Goals (current goals can now be found in the care plan section) Acute Rehab PT Goals Patient Stated Goal: "to be done with you" PT Goal Formulation: With patient/family Time  For Goal Achievement: 05/06/20 Potential to Achieve Goals: Fair Progress towards PT goals: Progressing toward goals    Frequency    7X/week      PT Plan Current plan remains appropriate    Co-evaluation              AM-PAC PT "6 Clicks" Mobility   Outcome Measure  Help needed turning from your back to your side while in a flat bed without using bedrails?: A Lot Help needed moving from lying on your back to sitting on the side of a flat bed without using bedrails?: Total Help needed moving to and from a bed to a chair (including a wheelchair)?: Total Help needed standing up from a chair using your arms (e.g., wheelchair or bedside chair)?: Total Help needed  to walk in hospital room?: Total Help needed climbing 3-5 steps with a railing? : Total 6 Click Score: 7    End of Session Equipment Utilized During Treatment: Gait belt Activity Tolerance: (Limited by patient behavior and overall cooperation with session)   Nurse Communication: Mobility status PT Visit Diagnosis: Muscle weakness (generalized) (M62.81);Pain;Other abnormalities of gait and mobility (R26.89) Pain - Right/Left: Right Pain - part of body: Hip     Time: 2979-8921 PT Time Calculation (min) (ACUTE ONLY): 24 min  Charges:  $Therapeutic Activity: 23-37 mins                     Tashawnda Bleiler H. Owens Shark, PT, DPT, NCS 04/23/20, 10:27 AM [phone]

## 2020-04-23 NOTE — TOC Progression Note (Signed)
Transition of Care [facility]) - Progression Note    Patient Details  Name: Monica [name] MRN: [id number] Date of Birth: Feb 06, 1926  Transition of Care [facility]) CM/SW [place], RN Phone Number: 04/23/2020, 10:17 AM  Clinical Narrative:     Reviewed bed offers with the patient and she has been to [facility] before she is accepting the bed offer from [facility], she has had vaccines       Expected Discharge Plan and Services                                                 Social Determinants of Health (SDOH) Interventions    Readmission Risk Interventions Readmission Risk Prevention Plan 06/08/2019 06/08/2019  Transportation Screening Complete Complete  PCP or Specialist Appt within 3-5 Days Complete Complete  HRI or [place] - Complete  Social Work Consult for [place] Planning/Counseling Complete Complete  Palliative Care Screening Not Applicable Not Applicable  Medication Review Press photographer) Complete Complete  Some recent data might be hidden

## 2020-04-23 NOTE — TOC Progression Note (Signed)
Transition of Care [facility]) - Progression Note    Patient Details  Name: Monica [name] MRN: [id number] Date of Birth: 13-Jul-1926  Transition of Care [facility]) CM/SW [place], RN Phone Number: 04/23/2020, 1:05 PM  Clinical Narrative:     [facility] does not have a bed today Reviewed the other offers with the patient and she has chosen [facility] in [address], I called Ricky with [facility] and sent DC information thru the Hub,        Expected Discharge Plan and Services           Expected Discharge Date: 04/23/20                                     Social Determinants of Health ([place]) Interventions    Readmission Risk Interventions Readmission Risk Prevention Plan 06/08/2019 06/08/2019  Transportation Screening Complete Complete  PCP or Specialist Appt within 3-5 Days Complete Complete  HRI or [place] - Complete  Social Work Consult for [place] Planning/Counseling Complete Complete  Palliative Care Screening Not Applicable Not Applicable  Medication Review Press photographer) Complete Complete  Some recent data might be hidden

## 2020-04-23 NOTE — Discharge Instructions (Signed)
Instructions after Hip Replacement     J. Jeffrey Poggi, M.D.  J. Lance Lashaundra Lehrmann, PA-C     Dept. of Orthopaedics & Sports Medicine  [facility]  [address]  [place], [place]  [address]  Phone: [phone]   Fax: [phone]    DIET: Drink plenty of non-alcoholic fluids. Resume your normal diet. Include foods high in fiber.  ACTIVITY:  You may use crutches or a walker with weight-bearing as tolerated, unless instructed otherwise. You may be weaned off of the walker or crutches by your Physical Therapist.  Do NOT reach below the level of your knees or cross your legs until allowed.    Continue doing gentle exercises. Exercising will reduce the pain and swelling, increase motion, and prevent muscle weakness.   Please continue to use the TED compression stockings for 6 weeks. You may remove the stockings at night, but should reapply them in the morning. Do not drive or operate any equipment until instructed.  WOUND CARE:  Continue to use ice packs periodically to reduce pain and swelling. Keep the incision clean and dry. You may bathe or shower after the staples are removed at the first office visit following surgery.  MEDICATIONS: You may resume your regular medications. Please take the pain medication as prescribed on the medication. Do not take pain medication on an empty stomach. You have been given a prescription for a blood thinner to prevent blood clots. Please take the medication as instructed. (NOTE: After completing a 2 week course of Lovenox, take one Enteric-coated aspirin once a day.) Pain medications and iron supplements can cause constipation. Use a stool softener (Senokot or Colace) on a daily basis and a laxative (dulcolax or miralax) as needed. Do not drive or drink alcoholic beverages when taking pain medications.  CALL THE OFFICE FOR: Temperature above 101 degrees Excessive bleeding or drainage on the dressing. Excessive swelling, coldness, or  paleness of the toes. Persistent nausea and vomiting.  FOLLOW-UP:  You should have an appointment to return to the office in 2 weeks after surgery. Arrangements have been made for continuation of Physical Therapy (either home therapy or outpatient therapy).  

## 2020-04-23 NOTE — Discharge Summary (Addendum)
Physician Discharge Summary   Monica [name]  female DOB: 1926/08/26  XLK:[id number]  PCP: [name], NP  Admit date: 04/19/2020 Discharge date: 04/24/2020  Admitted From: home Disposition:  SNF CODE STATUS: Full code   Hospital Course:  For full details, please see H&P, progress notes, consult notes and ancillary notes.  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 X-ray with right femoral neck fracture.    Fracture of femoral neck, right (HCC) s/p HEMIARTHROPLASTY on 04/21/20 Pt tolerated the surgery well.  Pain well controlled.  Per ortho, DVT Prophylaxis with Foot Pumps, TED hose and Eliquis.  Weight-Bearing as tolerated to right leg.  Pt will follow up with [facility] 2 weeks after discharge.  H/O deep venous thrombosis Eliquis held prior to surgery and resumed afterwards.  HTN:  Continued home Lotrel, HCTZ  Chronic anemia, iron def Hgb stable around 9's.  Anemia workup showed iron 23 and 6% sat.  Pt started on oral iron supplement.  Chronic obstructive pulmonary disease ([place]): stable Continued theophylline, Singulair and bronchodilators  Atrial fibrillation, chronic (HCC) Eliquis held prior to surgery and resumed afterwards.  Diabetes mellitus without complication ([place]) Most recent A1c6.5, controled. Patient is takingMetforminat home, resumed at discharge.  Fall at home, initial encounter PT/OT and SNF rehab.  Hx of Leg edema She is on as needed Lasix at home.No history of CHF.  Lasix wasn't given during her hospitalization.    Chronic hyponatremia   Unclear etiology.  Na ranged between 129-137 which were similar to her past values.  Continued home salt tablet 1g  TID.    Discharge Diagnoses:  Principal Problem:   Fracture of femoral neck, right (HCC) Active Problems:   H/O deep venous thrombosis   Essential hypertension   Iron deficiency anemia due to chronic blood loss   Chronic obstructive pulmonary disease (HCC)   Atrial fibrillation, chronic (HCC)   Diabetes mellitus without complication ([place])   Fall at home, initial encounter    Discharge Instructions:  Allergies as of 04/23/2020      Reactions   Sulfa Antibiotics Swelling   Celecoxib Nausea And Vomiting   Acetaminophen Itching   Codeine Rash   Lyrica [pregabalin] Rash   Penicillin G Rash   Has patient had a PCN reaction causing immediate rash, facial/tongue/throat swelling, SOB or lightheadedness with hypotension: Yes Has patient had a PCN reaction causing severe rash involving mucus membranes or skin necrosis: No Has patient had a PCN reaction that required hospitalization: No Has patient had a PCN reaction occurring within the last 10 years: Unknown If all of the above answers are "NO", then may proceed with Cephalosporin use.   Petrolatum-zinc Oxide Rash      Medication List    TAKE these medications   albuterol 108 (90 Base) MCG/ACT inhaler Commonly known as: VENTOLIN HFA Inhale 2 puffs into the lungs every 4 (four) hours as needed for wheezing or shortness of breath.   amLODipine-benazepril 5-20 MG capsule Commonly known as: LOTREL Take 1 capsule by mouth daily.   b complex vitamins capsule Take 1 capsule by mouth daily.   Breo Ellipta 100-25 MCG/INH Aepb Generic drug: fluticasone furoate-vilanterol TAKE 1 PUFF ONCE A DAY   desloratadine 5 MG tablet Commonly known as: CLARINEX Take 1 tablet (5 mg total) by mouth  daily.   docusate sodium 100 MG capsule Commonly known as: COLACE Take 1 capsule (100 mg total) by mouth 2 (two) times daily as needed for mild constipation.   Eliquis 2.5 MG Tabs tablet Generic drug: apixaban TAKE ONE TABLET TWICE DAILY What  changed: how much to take   feeding supplement (ENSURE ENLIVE) Liqd Take 237 mLs by mouth 2 (two) times daily between meals.   furosemide 20 MG tablet Commonly known as: LASIX Take 1 tablet (20 mg total) by mouth daily as needed for fluid or edema.   gabapentin 300 MG capsule Commonly known as: NEURONTIN Take 2 capsules (600 mg total) by mouth at bedtime.   hydrochlorothiazide 25 MG tablet Commonly known as: HYDRODIURIL Take 1 tablet (25 mg total) by mouth daily.   Iron 28 MG Tabs Take 1 tablet (28 mg total) by mouth daily. Or any iron supplements over-the-counter.   Magnesium 250 MG Tabs Take 250 mg by mouth daily at 6 (six) AM.   metFORMIN 500 MG 24 hr tablet Commonly known as: GLUCOPHAGE-XR Take 2 tablets (1,000 mg total) by mouth daily.   methocarbamol 500 MG tablet Commonly known as: ROBAXIN Take 1 tablet (500 mg total) by mouth every 8 (eight) hours as needed for muscle spasms.   montelukast 10 MG tablet Commonly known as: SINGULAIR Take 10 mg by mouth at bedtime.   multivitamin with minerals Tabs tablet Take 1 tablet by mouth daily. Start taking on: April 24, 2020   nystatin cream Commonly known as: MYCOSTATIN Apply topically 2 (two) times daily.   OneTouch Ultra test strip Generic drug: glucose blood 1 each by Other route daily. Use as instructed   oxybutynin 10 MG 24 hr tablet Commonly known as: DITROPAN-XL Take 1 tablet (10 mg total) by mouth daily.   oxyCODONE 5 MG immediate release tablet Commonly known as: Oxy IR/ROXICODONE Take 1 tablet (5 mg total) by mouth every 6 (six) hours as needed for moderate pain.   SALONPAS PAIN RELIEF PATCH EX Apply topically.   senna-docusate 8.6-50 MG tablet Commonly known as: Senokot-S Take 1 tablet by mouth at bedtime as needed for mild constipation.   sodium chloride 1 g tablet Take 1 tablet (1 g total) by mouth 3 (three) times daily with meals.   theophylline 100 MG 24 hr capsule Commonly known as:  Theo-24 TAKE 1 CAPSULE BY MOUTH EVERY DAY What changed:   how much to take  how to take this  when to take this   triamcinolone 0.025 % cream Commonly known as: KENALOG Apply 1 application topically 2 (two) times daily. Ok to mix with topical lotion and apply to all affected areas.        Contact information for follow-up providers    [name], NP. Schedule an appointment as soon as possible for a visit in 1 week(s).   Specialty: Family Medicine Contact information: [place] [address] [phone]        Lattie Corns, PA-C. Schedule an appointment as soon as possible for a visit in 2 week(s).   Specialty: Physician Assistant Contact information: [place] [address] [phone]            Contact information for after-discharge care    Destination    [facility] Preferred [facility]F .   Service: Skilled Nursing Contact information: [address] [place] [phone]  Allergies  Allergen Reactions  . Sulfa Antibiotics Swelling  . Celecoxib Nausea And Vomiting  . Acetaminophen Itching  . Codeine Rash  . Lyrica [Pregabalin] Rash  . Penicillin G Rash    Has patient had a PCN reaction causing immediate rash, facial/tongue/throat swelling, SOB or lightheadedness with hypotension: Yes Has patient had a PCN reaction causing severe rash involving mucus membranes or skin necrosis: No Has patient had a PCN reaction that required hospitalization: No Has patient had a PCN reaction occurring within the last 10 years: Unknown If all of the above answers are "NO", then may proceed with Cephalosporin use.  Marland Kitchen Petrolatum-Zinc Oxide Rash     The results of significant diagnostics from this hospitalization (including imaging, microbiology, ancillary and laboratory) are listed below for reference.    Consultations:   Procedures/Studies: DG Chest 1 View  Result Date: 04/19/2020 CLINICAL DATA:  Fall, right hip fracture. EXAM: CHEST  1 VIEW COMPARISON:  06/04/2019 FINDINGS: Low lung volumes are present, causing crowding of the pulmonary vasculature. Chronic reticulonodular opacities at the left lung base with chronic blunting of the left lateral costophrenic angle, appearance likely reflects residuum of chronic infection. Mild cardiomegaly. Atherosclerotic calcification of the aortic arch. Fullness in the region of the main pulmonary artery could possibly free be from pulmonary pulmonary arterial hypertension or underlying lesion/adenopathy. Old right lateral rib fractures. Thoracic spondylosis. IMPRESSION: 1. Chronic reticulonodular opacities at the left lung base, likely from residuum of chronic infection. Stable adjacent blunting of the left lateral costophrenic angle. 2. Fullness in the region of the main pulmonary artery could possibly be from pulmonary arterial hypertension or underlying lesion/adenopathy. 3. Low lung volumes. 4. Mild cardiomegaly. Electronically Signed   By: Van Clines M.D.   On: 04/19/2020 13:10   CT Head Wo Contrast  Result Date: 04/19/2020 CLINICAL DATA:  Unwitnessed fall at independent living facility, neck pain at EXAM: CT HEAD WITHOUT CONTRAST CT CERVICAL SPINE WITHOUT CONTRAST TECHNIQUE: Multidetector CT imaging of the head and cervical spine was performed following the standard protocol without intravenous contrast. Multiplanar CT image reconstructions of the cervical spine were also generated. COMPARISON:  10/24/2019 FINDINGS: CT HEAD FINDINGS Brain: Generalized atrophy. Normal ventricular morphology. No midline shift or mass effect. Small vessel chronic ischemic changes of deep cerebral white matter. No intracranial hemorrhage, mass lesion, evidence of acute infarction, or extra-axial fluid collection. Vascular: Atherosclerotic calcification of internal carotid  arteries at skull base. No hyperdense vessels. Skull: Intact Sinuses/Orbits: Clear Other: N/A CT CERVICAL SPINE FINDINGS Alignment: Normal Skull base and vertebrae: Mild osseous demineralization. Skull base intact. Multilevel facet degenerative changes. Scattered endplate spur formation as well as calcification of the anterior posterior longitudinal ligaments at several levels. Vertebral body heights maintained. No fracture, subluxation, or bone destruction. Soft tissues and spinal canal: Prevertebral soft tissues normal thickness. Atherosclerotic calcifications at the carotid bifurcations. Carotid bifurcations extend retropharyngeal bilaterally. Additional atherosclerotic calcifications at the proximal great vessels. Disc levels: AP narrowing of spinal canal LEFT paracentral due to calcified posterior longitudinal ligament at C4 with question mild flattening of the ventral aspect of the spinal cord. Upper chest: Tips of lung apices clear Other: N/A IMPRESSION: Atrophy with small vessel chronic ischemic changes of deep cerebral white matter. No acute intracranial abnormalities. Degenerative disc and facet disease changes of the cervical spine. AP narrowing of spinal canal at C4 due to calcification of the posterior longitudinal ligament question slightly flattening ventral aspect of spinal cord. Extensive atherosclerotic calcifications in the carotid systems, including carotid bifurcations  which are located retropharyngeal. Electronically Signed   By: Lavonia Dana M.D.   On: 04/19/2020 13:40   CT Cervical Spine Wo Contrast  Result Date: 04/19/2020 CLINICAL DATA:  Unwitnessed fall at independent living facility, neck pain at EXAM: CT HEAD WITHOUT CONTRAST CT CERVICAL SPINE WITHOUT CONTRAST TECHNIQUE: Multidetector CT imaging of the head and cervical spine was performed following the standard protocol without intravenous contrast. Multiplanar CT image reconstructions of the cervical spine were also generated.  COMPARISON:  10/24/2019 FINDINGS: CT HEAD FINDINGS Brain: Generalized atrophy. Normal ventricular morphology. No midline shift or mass effect. Small vessel chronic ischemic changes of deep cerebral white matter. No intracranial hemorrhage, mass lesion, evidence of acute infarction, or extra-axial fluid collection. Vascular: Atherosclerotic calcification of internal carotid arteries at skull base. No hyperdense vessels. Skull: Intact Sinuses/Orbits: Clear Other: N/A CT CERVICAL SPINE FINDINGS Alignment: Normal Skull base and vertebrae: Mild osseous demineralization. Skull base intact. Multilevel facet degenerative changes. Scattered endplate spur formation as well as calcification of the anterior posterior longitudinal ligaments at several levels. Vertebral body heights maintained. No fracture, subluxation, or bone destruction. Soft tissues and spinal canal: Prevertebral soft tissues normal thickness. Atherosclerotic calcifications at the carotid bifurcations. Carotid bifurcations extend retropharyngeal bilaterally. Additional atherosclerotic calcifications at the proximal great vessels. Disc levels: AP narrowing of spinal canal LEFT paracentral due to calcified posterior longitudinal ligament at C4 with question mild flattening of the ventral aspect of the spinal cord. Upper chest: Tips of lung apices clear Other: N/A IMPRESSION: Atrophy with small vessel chronic ischemic changes of deep cerebral white matter. No acute intracranial abnormalities. Degenerative disc and facet disease changes of the cervical spine. AP narrowing of spinal canal at C4 due to calcification of the posterior longitudinal ligament question slightly flattening ventral aspect of spinal cord. Extensive atherosclerotic calcifications in the carotid systems, including carotid bifurcations which are located retropharyngeal. Electronically Signed   By: Lavonia Dana M.D.   On: 04/19/2020 13:40   ECHOCARDIOGRAM COMPLETE  Result Date: 04/21/2020     ECHOCARDIOGRAM REPORT   Patient Name:   KRYSTEL FLETCHALL Date of Exam: 04/20/2020 Medical Rec #:  [id number]     Height:       63.0 in Accession #:    [id number]    Weight:       147.0 lb Date of Birth:  Aug 13, 1926     BSA:          1.696 m Patient Age:    54 years      BP:           110/48 mmHg Patient Gender: F             HR:           67 bpm. Exam Location:  [facility] Procedure: 2D Echo Indications:     Bilateral Leg Edema  History:         Patient has no prior history of Echocardiogram examinations.                  COPD and Stroke, Arrythmias:Atrial Fibrillation; Risk                  Factors:Hypertension, Dyslipidemia and Diabetes.  Sonographer:     L Thornton-Maynard Referring Phys:  [id number] Soledad Gerlach NIU Diagnosing Phys: Kathlyn Sacramento MD IMPRESSIONS  1. Left ventricular ejection fraction, by estimation, is 60 to 65%. The left ventricle has normal function. The left ventricle has no regional wall motion abnormalities. Left ventricular diastolic parameters  are indeterminate.  2. Right ventricular systolic function is normal. The right ventricular size is normal. Tricuspid regurgitation signal is inadequate for assessing PA pressure.  3. The mitral valve is normal in structure. No evidence of mitral valve regurgitation. No evidence of mitral stenosis.  4. The aortic valve is normal in structure. Aortic valve regurgitation is not visualized. Mild to moderate aortic valve sclerosis/calcification is present, without any evidence of aortic stenosis.  5. The inferior vena cava is normal in size with greater than 50% respiratory variability, suggesting right atrial pressure of 3 mmHg. FINDINGS  Left Ventricle: Left ventricular ejection fraction, by estimation, is 60 to 65%. The left ventricle has normal function. The left ventricle has no regional wall motion abnormalities. The left ventricular internal cavity size was normal in size. There is  no left ventricular hypertrophy. Left ventricular diastolic parameters are indeterminate.  Right Ventricle: The right ventricular size is normal. No increase in right ventricular wall thickness. Right ventricular systolic function is normal. Tricuspid regurgitation signal is inadequate for assessing PA pressure. The tricuspid regurgitant velocity is 1.85 m/s, and with an assumed right atrial pressure of 10 mmHg, the estimated right ventricular systolic pressure is 17.4 mmHg. Left Atrium: Left atrial size was normal in size. Right Atrium: Right atrial size was normal in size. Pericardium: There is no evidence of pericardial effusion. Mitral Valve: The mitral valve is normal in structure. Normal mobility of the mitral valve leaflets. No evidence of mitral valve regurgitation. No evidence of mitral valve stenosis. Tricuspid Valve: The tricuspid valve is normal in structure. Tricuspid valve regurgitation is trivial. No evidence of tricuspid stenosis. Aortic Valve: The aortic valve is normal in structure. Aortic valve regurgitation is not visualized. Mild to moderate aortic valve sclerosis/calcification is present, without any evidence of aortic stenosis. Aortic valve mean gradient measures 4.0 mmHg. Aortic valve peak gradient measures 7.4 mmHg. Aortic valve area, by VTI measures 1.46 cm. Pulmonic Valve: The pulmonic valve was normal in structure. Pulmonic valve regurgitation is trivial. No evidence of pulmonic stenosis. Aorta: The aortic root is normal in size and structure. Venous: The inferior vena cava is normal in size with greater than 50% respiratory variability, suggesting right atrial pressure of 3 mmHg. IAS/Shunts: No atrial level shunt detected by color flow Doppler.  LEFT VENTRICLE PLAX 2D LVIDd:         3.92 cm  Diastology LVIDs:         2.60 cm  LV e' lateral:   5.00 cm/s LV PW:         0.94 cm  LV E/e' lateral: 21.8 LV IVS:        0.84 cm  LV e' medial:    5.00 cm/s LVOT diam:     1.90 cm  LV E/e' medial:  21.8 LV SV:         49 LV SV Index:   29 LVOT Area:     2.84 cm  RIGHT VENTRICLE RV S  prime:     12.40 cm/s TAPSE (M-mode): 2.2 cm LEFT ATRIUM             Index LA diam:        3.50 cm 2.06 cm/m LA Vol (A2C):   42.8 ml 25.23 ml/m LA Vol (A4C):   34.1 ml 20.10 ml/m LA Biplane Vol: 38.4 ml 22.64 ml/m  AORTIC VALVE                   PULMONIC VALVE AV Area (Vmax):  1.44 cm    PV Vmax:       1.01 m/s AV Area (Vmean):   1.58 cm    PV Peak grad:  4.1 mmHg AV Area (VTI):     1.46 cm AV Vmax:           136.00 cm/s AV Vmean:          96.300 cm/s AV VTI:            0.333 m AV Peak Grad:      7.4 mmHg AV Mean Grad:      4.0 mmHg LVOT Vmax:         69.20 cm/s LVOT Vmean:        53.700 cm/s LVOT VTI:          0.172 m LVOT/AV VTI ratio: 0.52  AORTA Ao Root diam: 2.90 cm MITRAL VALVE                TRICUSPID VALVE MV Area (PHT): 2.02 cm     TR Peak grad:   13.7 mmHg MV E velocity: 109.00 cm/s  TR Vmax:        185.00 cm/s MV A velocity: 101.00 cm/s MV E/A ratio:  1.08         SHUNTS                             Systemic VTI:  0.17 m                             Systemic Diam: 1.90 cm Kathlyn Sacramento MD Electronically signed by Kathlyn Sacramento MD Signature Date/Time: 04/21/2020/9:19:36 AM    Final    DG HIP UNILAT W OR W/O PELVIS 2-3 VIEWS RIGHT  Result Date: 04/21/2020 CLINICAL DATA:  Postop right hip hemiarthroplasty EXAM: DG HIP (WITH OR WITHOUT PELVIS) 2-3V RIGHT COMPARISON:  04/19/2020 FINDINGS: Frontal view of the pelvis as well as cross-table lateral views of the right hip are obtained. Interval placement of right hip hemiarthroplasty in the expected position without signs of acute complication. There are postsurgical changes in the overlying soft tissues. Stable left hip hemiarthroplasty. IMPRESSION: 1. Right hip hemiarthroplasty as above. Electronically Signed   By: Randa Ngo M.D.   On: 04/21/2020 20:19   DG Hip Unilat With Pelvis 2-3 Views Right  Addendum Date: 04/19/2020   ADDENDUM REPORT: 04/19/2020 13:57 ADDENDUM: The original report was by Dr. Van Clines. The following addendum is  by Dr. Van Clines: This is a correction to previous impression # 1. Impression # 1 should read: 1. Intra-articular RIGHT femoral neck fracture with varus angulation. Discussed via Epic chat with Dr. Derrell Lolling at 1:55 p.m. on April 19, 2020. Electronically Signed   By: Van Clines M.D.   On: 04/19/2020 13:57   Result Date: 04/19/2020 CLINICAL DATA:  Fall, right hip pain EXAM: DG HIP (WITH OR WITHOUT PELVIS) 2-3V RIGHT COMPARISON:  06/03/2019 pelvic radiograph FINDINGS: Right femoral neck fracture noted, intra-articular, with varus angulation. Primarily transcervical. Left hip hemiarthroplasty. Vascular calcifications noted. No additional fractures are identified. IMPRESSION: 1. Intra-articular left femoral neck fracture with varus angulation. 2. Left hip hemiarthroplasty. 3. Atherosclerosis. Electronically Signed: By: Van Clines M.D. On: 04/19/2020 13:07      Labs: BNP (last 3 results) Recent Labs    04/19/20 1252  BNP 505.3*   Basic Metabolic Panel: Recent Labs  Lab 04/19/20  1252 04/22/20 0416 04/23/20 0527  NA 137 133* 130*  K 4.1 5.1 5.1  CL 101 99 93*  CO2 27 27 26   GLUCOSE 151* 145* 154*  BUN 19 25* 25*  CREATININE 0.84 1.11* 0.96  CALCIUM 9.3 9.3 9.4  MG  --   --  2.1   Liver Function Tests: No results for input(s): AST, ALT, ALKPHOS, BILITOT, PROT, ALBUMIN in the last 168 hours. No results for input(s): LIPASE, AMYLASE in the last 168 hours. No results for input(s): AMMONIA in the last 168 hours. CBC: Recent Labs  Lab 04/19/20 1252 04/20/20 1357 04/21/20 0419 04/22/20 0416 04/23/20 0527  WBC 8.9 10.3 9.6 10.0 10.6*  NEUTROABS 6.5  --   --   --   --   HGB 9.2* 9.7* 9.1* 9.3* 9.2*  HCT 31.6* 33.0* 29.7* 32.3* 31.1*  MCV 70.1* 70.8* 68.0* 71.5* 70.5*  PLT 327 310 327 319 300   Cardiac Enzymes: No results for input(s): CKTOTAL, CKMB, CKMBINDEX, TROPONINI in the last 168 hours. BNP: Invalid input(s): POCBNP CBG: Recent Labs  Lab  04/22/20 1137 04/22/20 1649 04/22/20 2056 04/23/20 0739 04/23/20 1122  GLUCAP 156* 175* 150* 153* 202*   D-Dimer No results for input(s): DDIMER in the last 72 hours. Hgb A1c No results for input(s): HGBA1C in the last 72 hours. Lipid Profile No results for input(s): CHOL, HDL, LDLCALC, TRIG, CHOLHDL, LDLDIRECT in the last 72 hours. Thyroid function studies No results for input(s): TSH, T4TOTAL, T3FREE, THYROIDAB in the last 72 hours.  Invalid input(s): FREET3 Anemia work up Recent Labs    04/20/20 1357  RETICCTPCT 1.3   Urinalysis    Component Value Date/Time   COLORURINE YELLOW (A) 06/08/2019 2110   APPEARANCEUR Cloudy (A) 08/21/2019 1350   LABSPEC 1.005 06/08/2019 2110   LABSPEC 1.012 03/04/2014 1535   PHURINE 5.0 06/08/2019 2110   GLUCOSEU Negative 08/21/2019 1350   GLUCOSEU 50 mg/dL 03/04/2014 1535   HGBUR NEGATIVE 06/08/2019 2110   BILIRUBINUR Negative 08/21/2019 1350   BILIRUBINUR Negative 03/04/2014 1535   KETONESUR NEGATIVE 06/08/2019 2110   PROTEINUR Negative 08/21/2019 1350   PROTEINUR NEGATIVE 06/08/2019 2110   UROBILINOGEN 0.2 12/27/2017 1520   NITRITE Negative 08/21/2019 1350   NITRITE NEGATIVE 06/08/2019 2110   LEUKOCYTESUR 3+ (A) 08/21/2019 1350   LEUKOCYTESUR SMALL (A) 06/08/2019 2110   LEUKOCYTESUR 3+ 03/04/2014 1535   Sepsis Labs Invalid input(s): PROCALCITONIN,  WBC,  LACTICIDVEN Microbiology Recent Results (from the past 240 hour(s))  SARS Coronavirus 2 by RT PCR (hospital order, performed in [place] hospital lab) Nasopharyngeal Nasopharyngeal Swab     Status: None   Collection Time: 04/19/20  2:35 PM   Specimen: Nasopharyngeal Swab  Result Value Ref Range Status   SARS Coronavirus 2 NEGATIVE NEGATIVE Final    Comment: (NOTE) SARS-CoV-2 target nucleic acids are NOT DETECTED. The SARS-CoV-2 RNA is generally detectable in upper and lower respiratory specimens during the acute phase of infection. The lowest concentration of SARS-CoV-2  viral copies this assay can detect is 250 copies / mL. A negative result does not preclude SARS-CoV-2 infection and should not be used as the sole basis for treatment or other patient management decisions.  A negative result may occur with improper specimen collection / handling, submission of specimen other than nasopharyngeal swab, presence of viral mutation(s) within the areas targeted by this assay, and inadequate number of viral copies (<250 copies / mL). A negative result must be combined with clinical observations, patient history, and  epidemiological information. Fact Sheet for Patients:   StrictlyIdeas.no Fact Sheet for Healthcare Providers: BankingDealers.co.za This test is not yet approved or cleared  by the [place] FDA and has been authorized for detection and/or diagnosis of SARS-CoV-2 by FDA under an Emergency Use Authorization (EUA).  This EUA will remain in effect (meaning this test can be used) for the duration of the COVID-19 declaration under Section 564(b)(1) of the Act, 21 U.S.C. section 360bbb-3(b)(1), unless the authorization is terminated or revoked sooner. Performed at [facility] [facility] [place], [address] [place], [place] [address]   MRSA PCR Screening     Status: None   Collection Time: 04/19/20  7:30 PM   Specimen: Nasopharyngeal  Result Value Ref Range Status   MRSA by PCR NEGATIVE NEGATIVE Final    Comment:        The GeneXpert MRSA Assay (FDA approved for NASAL specimens only), is one component of a comprehensive MRSA colonization surveillance program. It is not intended to diagnose MRSA infection nor to guide or monitor treatment for MRSA infections. Performed at [facility], [address] [place], [place] [address]      Total time spend on discharging this patient, including the last patient exam, discussing the hospital stay, instructions for ongoing care as it relates to  all pertinent caregivers, as well as preparing the medical discharge records, prescriptions, and/or referrals as applicable, is 35 minutes.    Enzo Bi, MD  [facility] 04/23/2020, 12:37 PM  If 7PM-7AM, please contact night-coverage

## 2020-04-23 NOTE — TOC Progression Note (Signed)
Transition of Care [facility]) - Progression Note    Patient Details  Name: Monica [name] MRN: [id number] Date of Birth: Dec 21, 1925  Transition of Care [facility]) CM/SW [place], RN Phone Number: 04/23/2020, 12:53 PM  Clinical Narrative:    I notified Tammy at [facility] that the patient has a DC for today and I sent the information along with the DC summary and order to [facility] thru the [facility].        Expected Discharge Plan and Services           Expected Discharge Date: 04/23/20                                     Social Determinants of Health (SDOH) Interventions    Readmission Risk Interventions Readmission Risk Prevention Plan 06/08/2019 06/08/2019  Transportation Screening Complete Complete  PCP or Specialist Appt within 3-5 Days Complete Complete  HRI or [place] - Complete  Social Work Consult for [place] Planning/Counseling Complete Complete  Palliative Care Screening Not Applicable Not Applicable  Medication Review Press photographer) Complete Complete  Some recent data might be hidden

## 2020-04-23 NOTE — TOC Progression Note (Signed)
Transition of Care [facility]) - Progression Note    Patient Details  Name: Monica [name] MRN: [id number] Date of Birth: 1926-08-06  Transition of Care [facility]) CM/SW [place], RN Phone Number: 04/23/2020, 2:13 PM  Clinical Narrative:     Damaris Schooner with [name] the patient's daughter and let her know the bed choice and anticipate DC today, she requested a call once the patient discharges       Expected Discharge Plan and Services           Expected Discharge Date: 04/23/20                                     Social Determinants of Health ([place]) Interventions    Readmission Risk Interventions Readmission Risk Prevention Plan 06/08/2019 06/08/2019  Transportation Screening Complete Complete  PCP or Specialist Appt within 3-5 Days Complete Complete  HRI or [place] - Complete  Social Work Consult for [place] Planning/Counseling Complete Complete  Palliative Care Screening Not Applicable Not Applicable  Medication Review Press photographer) Complete Complete  Some recent data might be hidden

## 2020-04-23 NOTE — Care Management Important Message (Signed)
Important Message  Patient Details  Name: Monica [name] MRN: [id number] Date of Birth: [birth year]   Medicare Important Message Given:  Yes     Juliann Pulse A Donathan Buller 04/23/2020, 11:57 AM

## 2020-04-23 NOTE — Progress Notes (Signed)
  Subjective: 2 Days Post-Op Procedure(s) (LRB): ARTHROPLASTY BIPOLAR HIP (HEMIARTHROPLASTY) (Right) Patient reports pain as mild.   Patient is well, and has had no acute complaints or problems Plan is for discharge to SNF. Negative for chest pain and shortness of breath Fever: no Gastrointestinal:Negative for nausea and vomiting  Objective: Vital signs in last 24 hours: Temp:  [97.4 F (36.3 C)-100 F (37.8 C)] 98 F (36.7 C) (06/09 0739) Pulse Rate:  [75-92] 76 (06/09 0739) Resp:  [16-18] 17 (06/09 0739) BP: (134-151)/(56-66) 134/56 (06/09 0739) SpO2:  [91 %-95 %] 95 % (06/09 0739)  Intake/Output from previous day:  Intake/Output Summary (Last 24 hours) at 04/23/2020 1253 Last data filed at 04/22/2020 1830 Gross per 24 hour  Intake 360 ml  Output --  Net 360 ml    Intake/Output this shift: No intake/output data recorded.  Labs: Recent Labs    04/20/20 1357 04/21/20 0419 04/22/20 0416 04/23/20 0527  HGB 9.7* 9.1* 9.3* 9.2*   Recent Labs    04/22/20 0416 04/23/20 0527  WBC 10.0 10.6*  RBC 4.52 4.41  HCT 32.3* 31.1*  PLT 319 300   Recent Labs    04/22/20 0416 04/23/20 0527  NA 133* 130*  K 5.1 5.1  CL 99 93*  CO2 27 26  BUN 25* 25*  CREATININE 1.11* 0.96  GLUCOSE 145* 154*  CALCIUM 9.3 9.4   No results for input(s): LABPT, INR in the last 72 hours.   EXAM General - Patient is Alert, Appropriate and Oriented Extremity - Neurovascular intact Sensation intact distally Intact pulses distally Dorsiflexion/Plantar flexion intact Incision: dressing C/D/I No cellulitis present Dressing/Incision - clean, dry, no drainage Motor Function - intact, moving foot and toes well on exam.  Mild distention to the abdomen with normal bowel sounds.  Past Medical History:  Diagnosis Date  . Atrial fibrillation ([place])   . Bronchitis   . Cancer ([place])    Left leg growth, kidneys, lungs and breasts  . Carcinoma of unknown primary ([place])   . COPD (chronic  obstructive pulmonary disease) ([place])   . Diabetes mellitus without complication ([place])   . Hyperlipidemia   . Hypertension   . Pancreatitis   . Pneumonia   . Stroke ([employer])    TIA's  . Vitamin B12 deficiency     Assessment/Plan: 2 Days Post-Op Procedure(s) (LRB): ARTHROPLASTY BIPOLAR HIP (HEMIARTHROPLASTY) (Right) Principal Problem:   Fracture of femoral neck, right (HCC) Active Problems:   H/O deep venous thrombosis   Essential hypertension   Iron deficiency anemia due to chronic blood loss   Chronic obstructive pulmonary disease (HCC)   Atrial fibrillation, chronic (HCC)   Diabetes mellitus without complication ([place])   Fall at home, initial encounter  Estimated body mass index is 26.05 kg/m as calculated from the following:   Height as of this encounter: 5\' 3"  (1.6 m).   Weight as of this encounter: 66.7 kg. Advance diet Up with therapy D/C IV fluids when tolerating po intake.  Labs reviewed this AM. Hg 9.2. Patient reports she is passing gas.  Work on [employer] Up with PT today.  Current plan for discharge to SNF.  Continue Eliquis for DVT prevention.  Pain medication as prescribed. Follow-up with [place] in two weeks for staple removal from the right hip.  DVT Prophylaxis - Foot Pumps, TED hose and Eliquis Weight-Bearing as tolerated to right leg  J. Cameron Proud, PA-C [facility] Orthopaedic Surgery 04/23/2020, 12:53 PM

## 2020-04-24 DIAGNOSIS — E119 Type 2 diabetes mellitus without complications: Secondary | ICD-10-CM | POA: Diagnosis not present

## 2020-04-24 DIAGNOSIS — R52 Pain, unspecified: Secondary | ICD-10-CM | POA: Diagnosis not present

## 2020-04-24 DIAGNOSIS — Z4789 Encounter for other orthopedic aftercare: Secondary | ICD-10-CM | POA: Diagnosis not present

## 2020-04-24 DIAGNOSIS — Z736 Limitation of activities due to disability: Secondary | ICD-10-CM | POA: Diagnosis not present

## 2020-04-24 DIAGNOSIS — R279 Unspecified lack of coordination: Secondary | ICD-10-CM | POA: Diagnosis not present

## 2020-04-24 DIAGNOSIS — Z741 Need for assistance with personal care: Secondary | ICD-10-CM | POA: Diagnosis not present

## 2020-04-24 DIAGNOSIS — R1310 Dysphagia, unspecified: Secondary | ICD-10-CM | POA: Diagnosis not present

## 2020-04-24 DIAGNOSIS — I82409 Acute embolism and thrombosis of unspecified deep veins of unspecified lower extremity: Secondary | ICD-10-CM | POA: Diagnosis not present

## 2020-04-24 DIAGNOSIS — S72001A Fracture of unspecified part of neck of right femur, initial encounter for closed fracture: Secondary | ICD-10-CM | POA: Diagnosis not present

## 2020-04-24 DIAGNOSIS — S7290XA Unspecified fracture of unspecified femur, initial encounter for closed fracture: Secondary | ICD-10-CM | POA: Diagnosis not present

## 2020-04-24 DIAGNOSIS — S72001D Fracture of unspecified part of neck of right femur, subsequent encounter for closed fracture with routine healing: Secondary | ICD-10-CM | POA: Diagnosis not present

## 2020-04-24 DIAGNOSIS — R296 Repeated falls: Secondary | ICD-10-CM | POA: Diagnosis not present

## 2020-04-24 DIAGNOSIS — R278 Other lack of coordination: Secondary | ICD-10-CM | POA: Diagnosis not present

## 2020-04-24 DIAGNOSIS — R41841 Cognitive communication deficit: Secondary | ICD-10-CM | POA: Diagnosis not present

## 2020-04-24 DIAGNOSIS — R2689 Other abnormalities of gait and mobility: Secondary | ICD-10-CM | POA: Diagnosis not present

## 2020-04-24 DIAGNOSIS — R5381 Other malaise: Secondary | ICD-10-CM | POA: Diagnosis not present

## 2020-04-24 DIAGNOSIS — M6281 Muscle weakness (generalized): Secondary | ICD-10-CM | POA: Diagnosis not present

## 2020-04-24 LAB — CBC
HCT: 26.6 % — ABNORMAL LOW (ref 36.0–46.0)
Hemoglobin: 8.2 g/dL — ABNORMAL LOW (ref 12.0–15.0)
MCH: 21.1 pg — ABNORMAL LOW (ref 26.0–34.0)
MCHC: 30.8 g/dL (ref 30.0–36.0)
MCV: 68.4 fL — ABNORMAL LOW (ref 80.0–100.0)
Platelets: 287 10*3/uL (ref 150–400)
RBC: 3.89 MIL/uL (ref 3.87–5.11)
RDW: 19 % — ABNORMAL HIGH (ref 11.5–15.5)
WBC: 10.8 10*3/uL — ABNORMAL HIGH (ref 4.0–10.5)
nRBC: 0 % (ref 0.0–0.2)

## 2020-04-24 LAB — BASIC METABOLIC PANEL
Anion gap: 7 (ref 5–15)
BUN: 29 mg/dL — ABNORMAL HIGH (ref 8–23)
CO2: 27 mmol/L (ref 22–32)
Calcium: 9.4 mg/dL (ref 8.9–10.3)
Chloride: 95 mmol/L — ABNORMAL LOW (ref 98–111)
Creatinine, Ser: 0.85 mg/dL (ref 0.44–1.00)
GFR calc Af Amer: >60 mL/min (ref >60)
GFR calc non Af Amer: 59 mL/min — ABNORMAL LOW (ref >60)
Glucose, Bld: 143 mg/dL — ABNORMAL HIGH (ref 70–99)
Potassium: 4.8 mmol/L (ref 3.5–5.1)
Sodium: 129 mmol/L — ABNORMAL LOW (ref 135–145)

## 2020-04-24 LAB — PATHOLOGY

## 2020-04-24 LAB — GLUCOSE, CAPILLARY
Glucose-Capillary: 135 mg/dL — ABNORMAL HIGH (ref 70–99)
Glucose-Capillary: 192 mg/dL — ABNORMAL HIGH (ref 70–99)

## 2020-04-24 LAB — MAGNESIUM: Magnesium: 2 mg/dL (ref 1.7–2.4)

## 2020-04-24 MED ORDER — MAGNESIUM CITRATE PO SOLN
1.0000 | Freq: Once | ORAL | Status: AC
  Administered 2020-04-24: 1 via ORAL
  Filled 2020-04-24: qty 296

## 2020-04-24 NOTE — Progress Notes (Signed)
Patient is stable and ready for discharge. Patient's IV removed. Writer called daughter to notify that patient is discharging to [facility] today. Writer called report and spoke with Gregary Signs, RN and she verbalized understanding of all discharge information provided and provided call back number if any other information needed. Patient's belongings packed and with patient. Patient will be transported via EMS.

## 2020-04-24 NOTE — Progress Notes (Signed)
Physical Therapy Treatment Patient Details Name: Monica [name] MRN: [id number] DOB: 04/27/1926 Today's Date: 04/24/2020    History of Present Illness presented to ER secodnary to mechanical fall with acute onset of R hip pain in home environment; admitted for management of displaced R femoral neck fracture, s/p R hip unipolar hemiarthroplasty (6/7), WBAT, posterior approach.    PT Comments    Pt was long sitting in bed upon arriving. She agrees to PT session and is cooperative throughout. Pt does follow commands consistently throughout with increased time to process. She was able to exit R side of bed with mod/max assist. Sat EOB for several minutes with SBA prior to performing STS 3 x from elevated bed height to RW.  Max assist to stand. Unable to safely progress to taking steps. She returned to supine at conclusion of session with max assist. She was in bed with bed alarm in place and call bell in reach. Pt will benefit from SNF to address deficits and improve safe functional mobility.    Follow Up Recommendations  SNF     Equipment Recommendations       Recommendations for Other Services       Precautions / Restrictions Precautions Precautions: Fall;Posterior Hip Precaution Booklet Issued: No Restrictions Weight Bearing Restrictions: Yes RLE Weight Bearing: Weight bearing as tolerated    Mobility  Bed Mobility Overal bed mobility: Needs Assistance Bed Mobility: Supine to Sit;Sit to Supine     Supine to sit: Mod assist;Max assist;HOB elevated Sit to supine: Max assist   General bed mobility comments: pt was able to progress to EOB short sitting from supine (HOB elevated about 30 degrees) to EOB short sit with od/max of one + increased time.   Transfers Overall transfer level: Needs assistance Equipment used: Rolling walker (2 wheeled) Transfers: Sit to/from Stand Sit to Stand: Max assist;From elevated surface         General transfer comment: pt was able to STS 3  x EOB with max assist of one. stood to RW with vcs for technique, handplacement, and adhereing to precautions  Ambulation/Gait             General Gait Details: unable to safely progress   Stairs             Wheelchair Mobility    Modified Rankin (Stroke Patients Only)       Balance                                            Cognition Arousal/Alertness: Awake/alert Behavior During Therapy: WFL for tasks assessed/performed Overall Cognitive Status: Within Functional Limits for tasks assessed                                 General Comments: Pt was alert and oriented x 2. Was able to follow commands consistently throughout. Pt was cooperative this date and motivated to Participate in session. unable to recall precautions      Exercises      General Comments        Pertinent Vitals/Pain Pain Assessment: 0-10 Pain Score: 3  Faces Pain Scale: Hurts a little bit Pain Location: R hip Pain Descriptors / Indicators: Aching;Guarding;Grimacing Pain Intervention(s): Limited activity within patient's tolerance;Monitored during session;Premedicated before session    Home Living  Prior Function            PT Goals (current goals can now be found in the care plan section) Progress towards PT goals: Progressing toward goals    Frequency    7X/week      PT Plan Current plan remains appropriate    Co-evaluation              AM-PAC PT "6 Clicks" Mobility   Outcome Measure  Help needed turning from your back to your side while in a flat bed without using bedrails?: A Lot Help needed moving from lying on your back to sitting on the side of a flat bed without using bedrails?: A Lot Help needed moving to and from a bed to a chair (including a wheelchair)?: A Lot Help needed standing up from a chair using your arms (e.g., wheelchair or bedside chair)?: A Lot Help needed to walk in hospital  room?: Total Help needed climbing 3-5 steps with a railing? : Total 6 Click Score: 10    End of Session Equipment Utilized During Treatment: Gait belt Activity Tolerance: Patient tolerated treatment well Patient left: in bed;with call bell/phone within reach;with bed alarm set Nurse Communication: Mobility status PT Visit Diagnosis: Muscle weakness (generalized) (M62.81);Pain;Other abnormalities of gait and mobility (R26.89) Pain - Right/Left: Right Pain - part of body: Hip     Time: 1010-1025 PT Time Calculation (min) (ACUTE ONLY): 15 min  Charges:  $Therapeutic Activity: 8-22 mins                     Julaine Fusi PTA 04/24/20, 12:34 PM

## 2020-04-24 NOTE — TOC Transition Note (Signed)
Transition of Care [facility]) - CM/SW Discharge Note   Patient Details  Name: Monica [name] MRN: [id number] Date of Birth: 1926/07/15  Transition of Care [facility]) CM/SW Contact:  Su Hilt, RN Phone Number: 04/24/2020, 12:21 PM   Clinical Narrative:    DC packet is on the chart and the bedside nurse has called report and called the Daughter I called [facility] for transport and they will be here at 1 PM to transport, The bedside nurse is aware   Final next level of care: Skilled Nursing Facility Barriers to Discharge: Barriers Resolved   Patient Goals and CMS Choice        Discharge Placement              Patient chooses bed at: [place] [facility] Patient to be transferred to facility by: [facility] Name of family member notified: [name] Patient and family notified of of transfer: 04/24/20  Discharge Plan and Services                                     Social Determinants of Health (SDOH) Interventions     Readmission Risk Interventions Readmission Risk Prevention Plan 06/08/2019 06/08/2019  Transportation Screening Complete Complete  PCP or Specialist Appt within 3-5 Days Complete Complete  HRI or [place] - Complete  Social Work Consult for [place] Planning/Counseling Complete Complete  Palliative Care Screening Not Applicable Not Applicable  Medication Review Press photographer) Complete Complete  Some recent data might be hidden

## 2020-04-24 NOTE — Progress Notes (Signed)
  Subjective: 3 Days Post-Op Procedure(s) (LRB): ARTHROPLASTY BIPOLAR HIP (HEMIARTHROPLASTY) (Right) Patient reports pain as mild.   Patient is well, and has had no acute complaints or problems Plan is for discharge to SNF. Negative for chest pain and shortness of breath Fever: no Gastrointestinal:Negative for nausea and vomiting  Objective: Vital signs in last 24 hours: Temp:  [99.3 F (37.4 C)-99.7 F (37.6 C)] 99.5 F (37.5 C) (06/10 0738) Pulse Rate:  [89-97] 89 (06/10 0738) Resp:  [15-17] 16 (06/10 0738) BP: (127-160)/(56-60) 127/57 (06/10 0738) SpO2:  [91 %-94 %] 92 % (06/10 0738)  Intake/Output from previous day:  Intake/Output Summary (Last 24 hours) at 04/24/2020 0931 Last data filed at 04/23/2020 1605 Gross per 24 hour  Intake 205.84 ml  Output 650 ml  Net -444.16 ml    Intake/Output this shift: No intake/output data recorded.  Labs: Recent Labs    04/22/20 0416 04/23/20 0527 04/24/20 0420  HGB 9.3* 9.2* 8.2*   Recent Labs    04/23/20 0527 04/24/20 0420  WBC 10.6* 10.8*  RBC 4.41 3.89  HCT 31.1* 26.6*  PLT 300 287   Recent Labs    04/23/20 0527 04/24/20 0420  NA 130* 129*  K 5.1 4.8  CL 93* 95*  CO2 26 27  BUN 25* 29*  CREATININE 0.96 0.85  GLUCOSE 154* 143*  CALCIUM 9.4 9.4   No results for input(s): LABPT, INR in the last 72 hours.   EXAM General - Patient is Alert, Appropriate and Oriented Extremity - Neurovascular intact Sensation intact distally Intact pulses distally Dorsiflexion/Plantar flexion intact Incision: dressing C/D/I No cellulitis present Dressing/Incision - clean, dry, no drainage Motor Function - intact, moving foot and toes well on exam.  Mild distention to the abdomen with normal bowel sounds.  Past Medical History:  Diagnosis Date  . Atrial fibrillation ([place])   . Bronchitis   . Cancer ([place])    Left leg growth, kidneys, lungs and breasts  . Carcinoma of unknown primary ([place])   . COPD (chronic obstructive  pulmonary disease) ([place])   . Diabetes mellitus without complication ([place])   . Hyperlipidemia   . Hypertension   . Pancreatitis   . Pneumonia   . Stroke ([place])    TIA's  . Vitamin B12 deficiency     Assessment/Plan: 3 Days Post-Op Procedure(s) (LRB): ARTHROPLASTY BIPOLAR HIP (HEMIARTHROPLASTY) (Right) Principal Problem:   Fracture of femoral neck, right (HCC) Active Problems:   H/O deep venous thrombosis   Essential hypertension   Iron deficiency anemia due to chronic blood loss   Chronic obstructive pulmonary disease (HCC)   Atrial fibrillation, chronic (HCC)   Diabetes mellitus without complication ([place])   Fall at home, initial encounter  Estimated body mass index is 26.05 kg/m as calculated from the following:   Height as of this encounter: 5\' 3"  (1.6 m).   Weight as of this encounter: 66.7 kg. Advance diet Up with therapy when tolerating po intake. Labs reviewed this AM. Hg 8.2. Up with PT today.  Current plan for discharge to SNF.  Continue Eliquis for DVT prevention.  Pain medication as prescribed. Follow-up with [place] in two weeks for staple removal from the right hip.  DVT Prophylaxis - Foot Pumps, TED hose and Eliquis Weight-Bearing as tolerated to right leg  T. Rachelle Hora, PA-C [facility] Orthopaedic Surgery 04/24/2020, 9:31 AM

## 2020-04-24 NOTE — Plan of Care (Signed)

## 2020-04-24 NOTE — Progress Notes (Signed)
PROGRESS NOTE    Monica [name]  OXB:[id number] DOB: 08/06/1926 DOA: 04/19/2020 PCP: [name], NP   Brief Narrative:  Monica [name] is a 86 y.o. 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 X-ray with right femoral neck fracture.   Subjective: Pain controlled.  No fever, dyspnea, chest pain, N/V/D.  Still no BM yet, so pt is receiving aggressive bowel regimen today.   Assessment & Plan:   Principal Problem:   Fracture of femoral neck, right (HCC) Active Problems:   H/O deep venous thrombosis   Essential hypertension   Iron deficiency anemia due to chronic blood loss   Chronic obstructive pulmonary disease (HCC)   Atrial fibrillation, chronic (HCC)   Diabetes mellitus without complication ([place])   Fall at home, initial encounter  Fracture of femoral neck, right ([place]) s/p HEMIARTHROPLASTY on 04/21/20 -continue Eliquis. -Continue with pain management. -PT/OT and rehab SNF  H/O deep venous thrombosis -continue Eliquis  HTN:  -Continue home medications: Lotrel, HCTC,  -hydralazine prn  Chronic anemia .  Hgb stable around 9's.  Anemia workup showed iron 23 and 6% sat. --start oral iron supplement.  Chronic obstructive pulmonary disease ([place]): stable -Continue theophylline, Singulair and bronchodilators  Atrial fibrillation, chronic (HCC) --continue Eliquis  Diabetes mellitus without complication ([place]): Most recent A1c 6.5, controled. Patient is taking Metformin at home -SSI  Fall at home, initial encounter -pt/ot when able to  Leg edema: pt has trace leg edema.  She is on as needed Lasix.  No history of CHF -Hold Lasix  Constipation --Pt has had a BM since presentation --Miralax 34g q2h scheduled for 5 doses   Objective: Vitals:    04/23/20 0739 04/23/20 1732 04/23/20 2240 04/24/20 0738  BP: (!) 134/56 (!) 160/56 128/60 (!) 127/57  Pulse: 76 95 97 89  Resp: 17 15 17 16   Temp: 98 F (36.7 C) 99.3 F (37.4 C) 99.7 F (37.6 C) 99.5 F (37.5 C)  TempSrc:  Oral Oral Oral  SpO2: 95% 94% 91% 92%  Weight:      Height:       No intake or output data in the 24 hours ending 05/01/20 1135 Filed Weights   04/19/20 1155 04/21/20 1559  Weight: 66.7 kg 66.7 kg    Examination:  Constitutional: NAD, AAOx3 HEENT: conjunctivae and lids normal, EOMI CV: RRR no M,R,G. Distal pulses +2.  No cyanosis.   RESP: CTA B/L, normal respiratory effort  GI: +BS, mildly distended Extremities: No effusions, edema, or tenderness in BLE SKIN: warm, dry and intact Neuro: II - XII grossly intact.  Sensation intact    DVT prophylaxis: Eliquis Code Status: Full Family Communication:  Disposition Plan:  Status is: Inpatient  Remains inpatient appropriate because:Inpatient level of care appropriate due to severity of illness   Dispo: The patient is from: Home              Anticipated d/c is to: SNF              Anticipated d/c date is: tomorrow after pt has had a BM.              Patient currently is medically stable to d/c.   Consultants:   Orthopedic  Procedures:  Antimicrobials:   Data Reviewed: I have personally reviewed following labs and imaging studies  CBC: No results for input(s): WBC, NEUTROABS, HGB, HCT, MCV, PLT in the last 168 hours. Basic Metabolic Panel: No results for input(s): NA, K, CL, CO2, GLUCOSE, BUN, CREATININE, CALCIUM, MG, PHOS in the last 168 hours. GFR: Estimated Creatinine Clearance: 38.7 mL/min (by C-G formula based on SCr of 0.85 mg/dL). Liver Function Tests: No results for input(s): AST, ALT, ALKPHOS, BILITOT, PROT, ALBUMIN in the last 168 hours. No results for input(s): LIPASE, AMYLASE in the last 168 hours. No results for input(s): AMMONIA in the last 168 hours. Coagulation Profile: No  results for input(s): INR, PROTIME in the last 168 hours. Cardiac Enzymes: No results for input(s): CKTOTAL, CKMB, CKMBINDEX, TROPONINI in the last 168 hours. BNP (last 3 results) No results for input(s): PROBNP in the last 8760 hours. HbA1C: No results for input(s): HGBA1C in the last 72 hours. CBG: No results for input(s): GLUCAP in the last 168 hours. Lipid Profile: No results for input(s): CHOL, HDL, LDLCALC, TRIG, CHOLHDL, LDLDIRECT in the last 72 hours. Thyroid Function Tests: No results for input(s): TSH, T4TOTAL, FREET4, T3FREE, THYROIDAB in the last 72 hours. Anemia Panel: No results for input(s): VITAMINB12, FOLATE, FERRITIN, TIBC, IRON, RETICCTPCT in the last 72 hours. Sepsis Labs: No results for input(s): PROCALCITON, LATICACIDVEN in the last 168 hours.  Recent Results (from the past 240 hour(s))  SARS Coronavirus 2 by RT PCR (hospital order, performed in [facility] hospital lab) Nasopharyngeal Nasopharyngeal Swab     Status: None   Collection Time: 04/23/20  2:26 PM   Specimen: Nasopharyngeal Swab  Result Value Ref Range Status   SARS Coronavirus 2 NEGATIVE NEGATIVE Final    Comment: (NOTE) SARS-CoV-2 target nucleic acids are NOT DETECTED. The SARS-CoV-2 RNA is generally detectable in upper and lower respiratory specimens during the acute phase of infection. The lowest concentration of SARS-CoV-2 viral copies this assay can detect is 250 copies / mL. A negative result does not preclude SARS-CoV-2 infection and should not be used as the sole basis for treatment or other patient management decisions.  A negative result may occur with improper specimen collection / handling, submission of specimen other than nasopharyngeal swab, presence of viral mutation(s) within the areas targeted by this assay, and inadequate number of viral copies (<250 copies / mL). A negative result must be combined with clinical observations, patient history, and epidemiological  information. Fact Sheet for Patients:   StrictlyIdeas.no Fact Sheet for Healthcare Providers: BankingDealers.co.za This test is not yet approved or cleared  by the [place] FDA and has been authorized for detection and/or diagnosis of SARS-CoV-2 by FDA under an Emergency Use Authorization (EUA).  This EUA will remain in effect (meaning this test can be used) for the duration of the COVID-19 declaration under Section 564(b)(1) of the Act, 21 U.S.C. section 360bbb-3(b)(1), unless the authorization is terminated or revoked sooner. Performed at [facility], [address], [place], [place] [address]      Radiology Studies: No results found.  Scheduled Meds:  Continuous Infusions:    LOS: 5 days    Enzo Bi, MD [facility]  If 7PM-7AM, please contact night-coverage [email or web address]  05/01/2020, 11:35 AM   This record has been created using Systems analyst. Errors have been sought and corrected,but may not always be located. Such creation errors do not reflect on the standard of care.

## 2020-04-28 DIAGNOSIS — Z4789 Encounter for other orthopedic aftercare: Secondary | ICD-10-CM | POA: Diagnosis not present

## 2020-04-28 DIAGNOSIS — E119 Type 2 diabetes mellitus without complications: Secondary | ICD-10-CM | POA: Diagnosis not present

## 2020-04-28 DIAGNOSIS — I82409 Acute embolism and thrombosis of unspecified deep veins of unspecified lower extremity: Secondary | ICD-10-CM | POA: Diagnosis not present

## 2020-05-14 DIAGNOSIS — I82409 Acute embolism and thrombosis of unspecified deep veins of unspecified lower extremity: Secondary | ICD-10-CM | POA: Diagnosis not present

## 2020-05-14 DIAGNOSIS — Z4789 Encounter for other orthopedic aftercare: Secondary | ICD-10-CM | POA: Diagnosis not present

## 2020-05-14 DIAGNOSIS — E119 Type 2 diabetes mellitus without complications: Secondary | ICD-10-CM | POA: Diagnosis not present

## 2020-05-15 ENCOUNTER — Telehealth: Payer: Self-pay

## 2020-05-15 NOTE — Telephone Encounter (Signed)
Called lmom informing patient of appointment on 0706/2021. klh 

## 2020-05-20 ENCOUNTER — Ambulatory Visit: Payer: Medicare Other | Admitting: Adult Health

## 2020-05-21 DIAGNOSIS — M6281 Muscle weakness (generalized): Secondary | ICD-10-CM | POA: Diagnosis not present

## 2020-05-21 DIAGNOSIS — M25561 Pain in right knee: Secondary | ICD-10-CM | POA: Diagnosis not present

## 2020-05-21 DIAGNOSIS — Z96641 Presence of right artificial hip joint: Secondary | ICD-10-CM | POA: Diagnosis not present

## 2020-05-21 DIAGNOSIS — R296 Repeated falls: Secondary | ICD-10-CM | POA: Diagnosis not present

## 2020-05-21 DIAGNOSIS — R2681 Unsteadiness on feet: Secondary | ICD-10-CM | POA: Diagnosis not present

## 2020-05-22 ENCOUNTER — Ambulatory Visit: Payer: Medicare Other | Admitting: Adult Health

## 2020-05-22 DIAGNOSIS — R2681 Unsteadiness on feet: Secondary | ICD-10-CM | POA: Diagnosis not present

## 2020-05-22 DIAGNOSIS — R296 Repeated falls: Secondary | ICD-10-CM | POA: Diagnosis not present

## 2020-05-22 DIAGNOSIS — Z96641 Presence of right artificial hip joint: Secondary | ICD-10-CM | POA: Diagnosis not present

## 2020-05-22 DIAGNOSIS — M25561 Pain in right knee: Secondary | ICD-10-CM | POA: Diagnosis not present

## 2020-05-22 DIAGNOSIS — M6281 Muscle weakness (generalized): Secondary | ICD-10-CM | POA: Diagnosis not present

## 2020-05-23 ENCOUNTER — Telehealth: Payer: Self-pay

## 2020-05-23 DIAGNOSIS — R2681 Unsteadiness on feet: Secondary | ICD-10-CM | POA: Diagnosis not present

## 2020-05-23 DIAGNOSIS — Z96641 Presence of right artificial hip joint: Secondary | ICD-10-CM | POA: Diagnosis not present

## 2020-05-23 DIAGNOSIS — M6281 Muscle weakness (generalized): Secondary | ICD-10-CM | POA: Diagnosis not present

## 2020-05-23 DIAGNOSIS — M25561 Pain in right knee: Secondary | ICD-10-CM | POA: Diagnosis not present

## 2020-05-23 DIAGNOSIS — R296 Repeated falls: Secondary | ICD-10-CM | POA: Diagnosis not present

## 2020-05-23 NOTE — Telephone Encounter (Signed)
Confirmed and screened for 05-27-20 ov.

## 2020-05-26 DIAGNOSIS — R2681 Unsteadiness on feet: Secondary | ICD-10-CM | POA: Diagnosis not present

## 2020-05-26 DIAGNOSIS — M6281 Muscle weakness (generalized): Secondary | ICD-10-CM | POA: Diagnosis not present

## 2020-05-26 DIAGNOSIS — Z96641 Presence of right artificial hip joint: Secondary | ICD-10-CM | POA: Diagnosis not present

## 2020-05-26 DIAGNOSIS — R296 Repeated falls: Secondary | ICD-10-CM | POA: Diagnosis not present

## 2020-05-26 DIAGNOSIS — M25561 Pain in right knee: Secondary | ICD-10-CM | POA: Diagnosis not present

## 2020-05-27 ENCOUNTER — Encounter: Payer: Self-pay | Admitting: Adult Health

## 2020-05-27 ENCOUNTER — Other Ambulatory Visit: Payer: Self-pay

## 2020-05-27 ENCOUNTER — Ambulatory Visit (INDEPENDENT_AMBULATORY_CARE_PROVIDER_SITE_OTHER): Payer: Medicare Other | Admitting: Adult Health

## 2020-05-27 VITALS — BP 134/58 | HR 74 | Temp 97.3°F

## 2020-05-27 DIAGNOSIS — N3000 Acute cystitis without hematuria: Secondary | ICD-10-CM | POA: Diagnosis not present

## 2020-05-27 DIAGNOSIS — J449 Chronic obstructive pulmonary disease, unspecified: Secondary | ICD-10-CM | POA: Diagnosis not present

## 2020-05-27 DIAGNOSIS — I10 Essential (primary) hypertension: Secondary | ICD-10-CM | POA: Diagnosis not present

## 2020-05-27 DIAGNOSIS — E1165 Type 2 diabetes mellitus with hyperglycemia: Secondary | ICD-10-CM

## 2020-05-27 DIAGNOSIS — R300 Dysuria: Secondary | ICD-10-CM | POA: Diagnosis not present

## 2020-05-27 LAB — POCT URINALYSIS DIPSTICK
Bilirubin, UA: NEGATIVE
Blood, UA: NEGATIVE
Glucose, UA: NEGATIVE
Nitrite, UA: POSITIVE
Protein, UA: NEGATIVE
Spec Grav, UA: 1.01 (ref 1.010–1.025)
Urobilinogen, UA: 0.2 E.U./dL
pH, UA: 6.5 (ref 5.0–8.0)

## 2020-05-27 LAB — POCT GLYCOSYLATED HEMOGLOBIN (HGB A1C): Hemoglobin A1C: 5.8 % — AB (ref 4.0–5.6)

## 2020-05-27 MED ORDER — NITROFURANTOIN MONOHYD MACRO 100 MG PO CAPS: 100.0000 mg | ORAL_CAPSULE | Freq: Two times a day (BID) | ORAL | 0 refills | Status: DC

## 2020-05-27 NOTE — Progress Notes (Signed)
[facility] [place], [place] [address]  Internal MEDICINE  Office Visit Note  Patient Name: Monica [name]  [id number]  [id number]  Date of Service: 05/27/2020  Chief Complaint  Patient presents with  . Follow-up    3 month    HPI  Pt is here for follow up.  She had a fall on 04/19/2020 that results in right hip fracture.  She had a repair on 04/20/2020 that included a partial hip replacement.  She was discharged to rehab for about 3 weeks in [place] [facility].  She has physical/occupational therapy coming to her home to work with her.  She is wheelchair bound currently and they are working toward her using a walker.   PT is having some symptoms of UTI today, she complains fo some burning, with mild confusion. Her copd is well controlled at this time.     Current Medication: Outpatient Encounter Medications as of 05/27/2020  Medication Sig  . albuterol (VENTOLIN HFA) 108 (90 Base) MCG/ACT inhaler Inhale 2 puffs into the lungs every 4 (four) hours as needed for wheezing or shortness of breath.  Marland Kitchen amLODipine-benazepril (LOTREL) 5-20 MG capsule Take 1 capsule by mouth daily.  Marland Kitchen b complex vitamins capsule Take 1 capsule by mouth daily.  Marland Kitchen desloratadine (CLARINEX) 5 MG tablet Take 1 tablet (5 mg total) by mouth daily.  Marland Kitchen docusate sodium (COLACE) 100 MG capsule Take 1 capsule (100 mg total) by mouth 2 (two) times daily as needed for mild constipation.  Marland Kitchen ELIQUIS 2.5 MG TABS tablet TAKE ONE TABLET TWICE DAILY (Patient taking differently: Take 2.5 mg by mouth 2 (two) times daily. )  . feeding supplement (BOOST HIGH PROTEIN) LIQD Take 1 Container by mouth as needed.  . Ferrous Sulfate (IRON) 28 MG TABS Take 1 tablet (28 mg total) by mouth daily. Or any iron supplements over-the-counter.  . fluticasone furoate-vilanterol (BREO ELLIPTA) 100-25 MCG/INH AEPB TAKE 1 PUFF ONCE A DAY  . gabapentin (NEURONTIN) 300 MG capsule Take 2 capsules (600 mg total) by mouth at bedtime.  Marland Kitchen  glucose blood (ONETOUCH ULTRA) test strip 1 each by Other route daily. Use as instructed  . hydrochlorothiazide (HYDRODIURIL) 25 MG tablet Take 1 tablet (25 mg total) by mouth daily.  . Magnesium 250 MG TABS Take 250 mg by mouth daily at 6 (six) AM.  . metFORMIN (GLUCOPHAGE-XR) 500 MG 24 hr tablet Take 2 tablets (1,000 mg total) by mouth daily.  . methocarbamol (ROBAXIN) 500 MG tablet Take 1 tablet (500 mg total) by mouth every 8 (eight) hours as needed for muscle spasms.  . montelukast (SINGULAIR) 10 MG tablet Take 10 mg by mouth at bedtime.  Marland Kitchen nystatin cream (MYCOSTATIN) Apply topically 2 (two) times daily.  Marland Kitchen oxybutynin (DITROPAN-XL) 10 MG 24 hr tablet Take 1 tablet (10 mg total) by mouth daily.  Marland Kitchen oxyCODONE (OXY IR/ROXICODONE) 5 MG immediate release tablet Take 1 tablet (5 mg total) by mouth every 6 (six) hours as needed for moderate pain.  Marland Kitchen senna-docusate (SENOKOT-S) 8.6-50 MG tablet Take 1 tablet by mouth at bedtime as needed for mild constipation.  . theophylline (THEO-24) 100 MG 24 hr capsule TAKE 1 CAPSULE BY MOUTH EVERY DAY (Patient taking differently: Take 100 mg by mouth daily. TAKE 1 CAPSULE BY MOUTH EVERY DAY)  . feeding supplement, ENSURE ENLIVE, (ENSURE ENLIVE) LIQD Take 237 mLs by mouth 2 (two) times daily between meals. (Patient not taking: Reported on 05/27/2020)  . furosemide (LASIX) 20 MG  tablet Take 1 tablet (20 mg total) by mouth daily as needed for fluid or edema. (Patient not taking: Reported on 05/27/2020)  . Liniments (SALONPAS PAIN RELIEF PATCH EX) Apply topically. (Patient not taking: Reported on 05/27/2020)  . Multiple Vitamin (MULTIVITAMIN WITH MINERALS) TABS tablet Take 1 tablet by mouth daily. (Patient not taking: Reported on 05/27/2020)  . sodium chloride 1 g tablet Take 1 tablet (1 g total) by mouth 3 (three) times daily with meals.  . triamcinolone (KENALOG) 0.025 % cream Apply 1 application topically 2 (two) times daily. Ok to mix with topical lotion and apply to all  affected areas. (Patient not taking: Reported on 05/27/2020)  . [DISCONTINUED] famotidine (PEPCID) 20 MG tablet Take 1 tablet (20 mg total) by mouth 2 (two) times daily.  . [DISCONTINUED] pravastatin (PRAVACHOL) 40 MG tablet Take 1 tablet (40 mg total) by mouth daily.   No facility-administered encounter medications on file as of 05/27/2020.    Surgical History: Past Surgical History:  Procedure Laterality Date  . ABDOMINAL HYSTERECTOMY  1975  . BLADDER SUSPENSION  1989  . CAROTID ANGIOGRAPHY Right 11/23/2019   Procedure: CAROTID ANGIOGRAPHY;  Surgeon: Katha Cabal, MD;  Location: [place] [facility];  Service: Cardiovascular;  Laterality: Right;  . COLONOSCOPY    . COLONOSCOPY WITH PROPOFOL N/A 01/25/2018   Procedure: COLONOSCOPY WITH PROPOFOL;  Surgeon: [name], MD;  Location[facility] ENDOSCOPY;  Service: Endoscopy;  Laterality: N/A;  . ESOPHAGOGASTRODUODENOSCOPY (EGD) WITH PROPOFOL N/A 01/24/2018   Procedure: ESOPHAGOGASTRODUODENOSCOPY (EGD) WITH PROPOFOL;  Surgeon: [name], MD;  Location: [facility] ENDOSCOPY;  Service: Endoscopy;  Laterality: N/A;  . HIP ARTHROPLASTY Left 06/03/2019   Procedure: ARTHROPLASTY BIPOLAR HIP (HEMIARTHROPLASTY);  Surgeon: Dereck Leep, MD;  Location: [facility] ORS;  Service: Orthopedics;  Laterality: Left;  . HIP ARTHROPLASTY Right 04/21/2020   Procedure: ARTHROPLASTY BIPOLAR HIP (HEMIARTHROPLASTY);  Surgeon: Corky Mull, MD;  Location: [facility] ORS;  Service: Orthopedics;  Laterality: Right;    Medical History: Past Medical History:  Diagnosis Date  . Atrial fibrillation ([place])   . Bronchitis   . Cancer ([place])    Left leg growth, kidneys, lungs and breasts  . Carcinoma of unknown primary ([place])   . COPD (chronic obstructive pulmonary disease) ([place])   . Diabetes mellitus without complication ([place])   . Hyperlipidemia   . Hypertension   . Pancreatitis   . Pneumonia   . Stroke ([place])    TIA's  . Vitamin B12 deficiency     Family History: Family  History  Problem Relation Age of Onset  . Cancer Father   . Diabetes Brother     Social History   Socioeconomic History  . Marital status: Divorced    Spouse name: Not on file  . Number of children: Not on file  . Years of education: Not on file  . Highest education level: Not on file  Occupational History  . Not on file  Tobacco Use  . Smoking status: Former Smoker    Packs/day: 0.50    Years: 15.00    Pack years: 7.50    Types: Cigarettes    Quit date: 11/15/1988    Years since quitting: 31.5  . Smokeless tobacco: Never Used  Vaping Use  . Vaping Use: Never used  Substance and Sexual Activity  . Alcohol use: No    Alcohol/week: 0.0 standard drinks  . Drug use: No  . Sexual activity: Not on file    Comment: hysterectomy   Other Topics  Concern  . Not on file  Social History Narrative  . Not on file   Social Determinants of Health   Financial Resource Strain:   . Difficulty of Paying Living Expenses:   Food Insecurity:   . Worried About Charity fundraiser in the Last Year:   . Arboriculturist in the Last Year:   Transportation Needs:   . Film/video editor (Medical):   Marland Kitchen Lack of Transportation (Non-Medical):   Physical Activity:   . Days of Exercise per Week:   . Minutes of Exercise per Session:   Stress:   . Feeling of Stress :   Social Connections:   . Frequency of Communication with Friends and Family:   . Frequency of Social Gatherings with Friends and Family:   . Attends Religious Services:   . Active Member of Clubs or Organizations:   . Attends Archivist Meetings:   Marland Kitchen Marital Status:   Intimate Partner Violence:   . Fear of Current or Ex-Partner:   . Emotionally Abused:   Marland Kitchen Physically Abused:   . Sexually Abused:       Review of Systems  Constitutional: Negative for chills, fatigue and unexpected weight change.  HENT: Negative for congestion, rhinorrhea, sneezing and sore throat.   Eyes: Negative for photophobia, pain and  redness.  Respiratory: Negative for cough, chest tightness and shortness of breath.   Cardiovascular: Negative for chest pain and palpitations.  Gastrointestinal: Negative for abdominal pain, constipation, diarrhea, nausea and vomiting.  Endocrine: Negative.   Genitourinary: Negative for dysuria and frequency.  Musculoskeletal: Negative for arthralgias, back pain, joint swelling and neck pain.  Skin: Negative for rash.  Allergic/Immunologic: Negative.   Neurological: Negative for tremors and numbness.  Hematological: Negative for adenopathy. Does not bruise/bleed easily.  Psychiatric/Behavioral: Negative for behavioral problems and sleep disturbance. The patient is not nervous/anxious.     Vital Signs: BP (!) 134/58   Pulse 74   Temp (!) 97.3 F (36.3 C)   SpO2 98%    Physical Exam Vitals and nursing note reviewed.  Constitutional:      General: She is not in acute distress.    Appearance: She is well-developed. She is not diaphoretic.  HENT:     Head: Normocephalic and atraumatic.     Mouth/Throat:     Pharynx: No oropharyngeal exudate.  Eyes:     Pupils: Pupils are equal, round, and reactive to light.  Neck:     Thyroid: No thyromegaly.     Vascular: No JVD.     Trachea: No tracheal deviation.  Cardiovascular:     Rate and Rhythm: Normal rate and regular rhythm.     Heart sounds: Normal heart sounds. No murmur heard.  No friction rub. No gallop.   Pulmonary:     Effort: Pulmonary effort is normal. No respiratory distress.     Breath sounds: Normal breath sounds. No wheezing or rales.  Chest:     Chest wall: No tenderness.  Abdominal:     Palpations: Abdomen is soft.     Tenderness: There is no abdominal tenderness. There is no guarding.  Musculoskeletal:        General: Normal range of motion.     Cervical back: Normal range of motion and neck supple.  Lymphadenopathy:     Cervical: No cervical adenopathy.  Skin:    General: Skin is warm and dry.   Neurological:     Mental Status: She is alert and  oriented to person, place, and time.     Cranial Nerves: No cranial nerve deficit.  Psychiatric:        Behavior: Behavior normal.        Thought Content: Thought content normal.        Judgment: Judgment normal.     Assessment/Plan: 1. Type 2 diabetes mellitus with hyperglycemia, unspecified whether long term insulin use (HCC) A1C is 5.8 today doing well, continue current management.  - POCT HgB A1C  2. Essential hypertension Continue current management.   3. Chronic obstructive pulmonary disease, unspecified COPD type ([place]) Stable, continue current management at this time.   4. Dysuria - POCT Urinalysis Dipstick - CULTURE, URINE COMPREHENSIVE  5. Acute cystitis without hematuria Advised patient to take entire course of antibiotics as prescribed with food. Pt should return to clinic in 7-10 days if symptoms fail to improve or new symptoms develop.  - nitrofurantoin, macrocrystal-monohydrate, (MACROBID) 100 MG capsule; Take 1 capsule (100 mg total) by mouth 2 (two) times daily.  Dispense: 14 capsule; Refill: 0  General Counseling: Betzaira verbalizes understanding of the findings of todays visit and agrees with plan of treatment. I have discussed any further diagnostic evaluation that may be needed or ordered today. We also reviewed her medications today. she has been encouraged to call the office with any questions or concerns that should arise related to todays visit.    Orders Placed This Encounter  Procedures  . POCT HgB A1C    No orders of the defined types were placed in this encounter.   Time spent: 30 Minutes   This patient was seen by Orson Gear AGNP-C in Collaboration with Dr Lavera Guise as a part of collaborative care agreement     Kendell Bane AGNP-C Internal medicine

## 2020-05-28 DIAGNOSIS — R296 Repeated falls: Secondary | ICD-10-CM | POA: Diagnosis not present

## 2020-05-28 DIAGNOSIS — M6281 Muscle weakness (generalized): Secondary | ICD-10-CM | POA: Diagnosis not present

## 2020-05-28 DIAGNOSIS — M25561 Pain in right knee: Secondary | ICD-10-CM | POA: Diagnosis not present

## 2020-05-28 DIAGNOSIS — R2681 Unsteadiness on feet: Secondary | ICD-10-CM | POA: Diagnosis not present

## 2020-05-28 DIAGNOSIS — Z96641 Presence of right artificial hip joint: Secondary | ICD-10-CM | POA: Diagnosis not present

## 2020-05-29 DIAGNOSIS — Z96641 Presence of right artificial hip joint: Secondary | ICD-10-CM | POA: Diagnosis not present

## 2020-05-29 DIAGNOSIS — M6281 Muscle weakness (generalized): Secondary | ICD-10-CM | POA: Diagnosis not present

## 2020-05-29 DIAGNOSIS — R2681 Unsteadiness on feet: Secondary | ICD-10-CM | POA: Diagnosis not present

## 2020-05-29 DIAGNOSIS — M25561 Pain in right knee: Secondary | ICD-10-CM | POA: Diagnosis not present

## 2020-05-29 DIAGNOSIS — R296 Repeated falls: Secondary | ICD-10-CM | POA: Diagnosis not present

## 2020-05-30 DIAGNOSIS — R296 Repeated falls: Secondary | ICD-10-CM | POA: Diagnosis not present

## 2020-05-30 DIAGNOSIS — M25561 Pain in right knee: Secondary | ICD-10-CM | POA: Diagnosis not present

## 2020-05-30 DIAGNOSIS — Z96641 Presence of right artificial hip joint: Secondary | ICD-10-CM | POA: Diagnosis not present

## 2020-05-30 DIAGNOSIS — M6281 Muscle weakness (generalized): Secondary | ICD-10-CM | POA: Diagnosis not present

## 2020-05-30 DIAGNOSIS — R2681 Unsteadiness on feet: Secondary | ICD-10-CM | POA: Diagnosis not present

## 2020-05-30 LAB — CULTURE, URINE COMPREHENSIVE

## 2020-06-01 DIAGNOSIS — R296 Repeated falls: Secondary | ICD-10-CM | POA: Diagnosis not present

## 2020-06-01 DIAGNOSIS — M6281 Muscle weakness (generalized): Secondary | ICD-10-CM | POA: Diagnosis not present

## 2020-06-01 DIAGNOSIS — R2681 Unsteadiness on feet: Secondary | ICD-10-CM | POA: Diagnosis not present

## 2020-06-01 DIAGNOSIS — M25561 Pain in right knee: Secondary | ICD-10-CM | POA: Diagnosis not present

## 2020-06-01 DIAGNOSIS — Z96641 Presence of right artificial hip joint: Secondary | ICD-10-CM | POA: Diagnosis not present

## 2020-06-02 DIAGNOSIS — M25561 Pain in right knee: Secondary | ICD-10-CM | POA: Diagnosis not present

## 2020-06-02 DIAGNOSIS — M6281 Muscle weakness (generalized): Secondary | ICD-10-CM | POA: Diagnosis not present

## 2020-06-02 DIAGNOSIS — R296 Repeated falls: Secondary | ICD-10-CM | POA: Diagnosis not present

## 2020-06-02 DIAGNOSIS — Z96641 Presence of right artificial hip joint: Secondary | ICD-10-CM | POA: Diagnosis not present

## 2020-06-02 DIAGNOSIS — R2681 Unsteadiness on feet: Secondary | ICD-10-CM | POA: Diagnosis not present

## 2020-06-03 DIAGNOSIS — Z96641 Presence of right artificial hip joint: Secondary | ICD-10-CM | POA: Diagnosis not present

## 2020-06-03 DIAGNOSIS — R2681 Unsteadiness on feet: Secondary | ICD-10-CM | POA: Diagnosis not present

## 2020-06-03 DIAGNOSIS — M6281 Muscle weakness (generalized): Secondary | ICD-10-CM | POA: Diagnosis not present

## 2020-06-03 DIAGNOSIS — M25561 Pain in right knee: Secondary | ICD-10-CM | POA: Diagnosis not present

## 2020-06-03 DIAGNOSIS — R296 Repeated falls: Secondary | ICD-10-CM | POA: Diagnosis not present

## 2020-06-04 DIAGNOSIS — M6281 Muscle weakness (generalized): Secondary | ICD-10-CM | POA: Diagnosis not present

## 2020-06-04 DIAGNOSIS — R2681 Unsteadiness on feet: Secondary | ICD-10-CM | POA: Diagnosis not present

## 2020-06-04 DIAGNOSIS — Z96641 Presence of right artificial hip joint: Secondary | ICD-10-CM | POA: Diagnosis not present

## 2020-06-04 DIAGNOSIS — M25561 Pain in right knee: Secondary | ICD-10-CM | POA: Diagnosis not present

## 2020-06-04 DIAGNOSIS — R296 Repeated falls: Secondary | ICD-10-CM | POA: Diagnosis not present

## 2020-06-05 DIAGNOSIS — R2681 Unsteadiness on feet: Secondary | ICD-10-CM | POA: Diagnosis not present

## 2020-06-05 DIAGNOSIS — R296 Repeated falls: Secondary | ICD-10-CM | POA: Diagnosis not present

## 2020-06-05 DIAGNOSIS — M25561 Pain in right knee: Secondary | ICD-10-CM | POA: Diagnosis not present

## 2020-06-05 DIAGNOSIS — Z96641 Presence of right artificial hip joint: Secondary | ICD-10-CM | POA: Diagnosis not present

## 2020-06-05 DIAGNOSIS — M6281 Muscle weakness (generalized): Secondary | ICD-10-CM | POA: Diagnosis not present

## 2020-06-08 DIAGNOSIS — R2681 Unsteadiness on feet: Secondary | ICD-10-CM | POA: Diagnosis not present

## 2020-06-08 DIAGNOSIS — R296 Repeated falls: Secondary | ICD-10-CM | POA: Diagnosis not present

## 2020-06-08 DIAGNOSIS — Z96641 Presence of right artificial hip joint: Secondary | ICD-10-CM | POA: Diagnosis not present

## 2020-06-08 DIAGNOSIS — M6281 Muscle weakness (generalized): Secondary | ICD-10-CM | POA: Diagnosis not present

## 2020-06-08 DIAGNOSIS — M25561 Pain in right knee: Secondary | ICD-10-CM | POA: Diagnosis not present

## 2020-06-09 DIAGNOSIS — R2681 Unsteadiness on feet: Secondary | ICD-10-CM | POA: Diagnosis not present

## 2020-06-09 DIAGNOSIS — M25561 Pain in right knee: Secondary | ICD-10-CM | POA: Diagnosis not present

## 2020-06-09 DIAGNOSIS — Z96641 Presence of right artificial hip joint: Secondary | ICD-10-CM | POA: Diagnosis not present

## 2020-06-09 DIAGNOSIS — M6281 Muscle weakness (generalized): Secondary | ICD-10-CM | POA: Diagnosis not present

## 2020-06-09 DIAGNOSIS — R296 Repeated falls: Secondary | ICD-10-CM | POA: Diagnosis not present

## 2020-06-10 DIAGNOSIS — Z96641 Presence of right artificial hip joint: Secondary | ICD-10-CM | POA: Diagnosis not present

## 2020-06-10 DIAGNOSIS — M25561 Pain in right knee: Secondary | ICD-10-CM | POA: Diagnosis not present

## 2020-06-10 DIAGNOSIS — R2681 Unsteadiness on feet: Secondary | ICD-10-CM | POA: Diagnosis not present

## 2020-06-10 DIAGNOSIS — R296 Repeated falls: Secondary | ICD-10-CM | POA: Diagnosis not present

## 2020-06-10 DIAGNOSIS — M6281 Muscle weakness (generalized): Secondary | ICD-10-CM | POA: Diagnosis not present

## 2020-06-11 ENCOUNTER — Telehealth: Payer: Self-pay

## 2020-06-11 DIAGNOSIS — R296 Repeated falls: Secondary | ICD-10-CM | POA: Diagnosis not present

## 2020-06-11 DIAGNOSIS — Z96641 Presence of right artificial hip joint: Secondary | ICD-10-CM | POA: Diagnosis not present

## 2020-06-11 DIAGNOSIS — M25561 Pain in right knee: Secondary | ICD-10-CM | POA: Diagnosis not present

## 2020-06-11 DIAGNOSIS — R2681 Unsteadiness on feet: Secondary | ICD-10-CM | POA: Diagnosis not present

## 2020-06-11 DIAGNOSIS — M6281 Muscle weakness (generalized): Secondary | ICD-10-CM | POA: Diagnosis not present

## 2020-06-11 NOTE — Telephone Encounter (Signed)
Plan of care signed for PT and faxed back to [employer] at [phone] and placed in scan.

## 2020-06-12 ENCOUNTER — Other Ambulatory Visit: Payer: Self-pay

## 2020-06-13 DIAGNOSIS — I82401 Acute embolism and thrombosis of unspecified deep veins of right lower extremity: Secondary | ICD-10-CM | POA: Diagnosis not present

## 2020-06-13 DIAGNOSIS — M1711 Unilateral primary osteoarthritis, right knee: Secondary | ICD-10-CM | POA: Diagnosis not present

## 2020-06-13 DIAGNOSIS — E1165 Type 2 diabetes mellitus with hyperglycemia: Secondary | ICD-10-CM | POA: Diagnosis not present

## 2020-06-13 DIAGNOSIS — Z96641 Presence of right artificial hip joint: Secondary | ICD-10-CM | POA: Diagnosis not present

## 2020-06-15 DIAGNOSIS — R296 Repeated falls: Secondary | ICD-10-CM | POA: Diagnosis not present

## 2020-06-15 DIAGNOSIS — M6281 Muscle weakness (generalized): Secondary | ICD-10-CM | POA: Diagnosis not present

## 2020-06-15 DIAGNOSIS — M25561 Pain in right knee: Secondary | ICD-10-CM | POA: Diagnosis not present

## 2020-06-15 DIAGNOSIS — Z96641 Presence of right artificial hip joint: Secondary | ICD-10-CM | POA: Diagnosis not present

## 2020-06-15 DIAGNOSIS — R2681 Unsteadiness on feet: Secondary | ICD-10-CM | POA: Diagnosis not present

## 2020-06-16 DIAGNOSIS — R296 Repeated falls: Secondary | ICD-10-CM | POA: Diagnosis not present

## 2020-06-16 DIAGNOSIS — Z96641 Presence of right artificial hip joint: Secondary | ICD-10-CM | POA: Diagnosis not present

## 2020-06-16 DIAGNOSIS — M6281 Muscle weakness (generalized): Secondary | ICD-10-CM | POA: Diagnosis not present

## 2020-06-16 DIAGNOSIS — M25561 Pain in right knee: Secondary | ICD-10-CM | POA: Diagnosis not present

## 2020-06-16 DIAGNOSIS — R2681 Unsteadiness on feet: Secondary | ICD-10-CM | POA: Diagnosis not present

## 2020-06-17 ENCOUNTER — Other Ambulatory Visit: Payer: Self-pay | Admitting: Adult Health

## 2020-06-17 DIAGNOSIS — M25561 Pain in right knee: Secondary | ICD-10-CM | POA: Diagnosis not present

## 2020-06-17 DIAGNOSIS — R2681 Unsteadiness on feet: Secondary | ICD-10-CM | POA: Diagnosis not present

## 2020-06-17 DIAGNOSIS — R296 Repeated falls: Secondary | ICD-10-CM | POA: Diagnosis not present

## 2020-06-17 DIAGNOSIS — M6281 Muscle weakness (generalized): Secondary | ICD-10-CM | POA: Diagnosis not present

## 2020-06-17 DIAGNOSIS — Z7901 Long term (current) use of anticoagulants: Secondary | ICD-10-CM

## 2020-06-17 DIAGNOSIS — Z96641 Presence of right artificial hip joint: Secondary | ICD-10-CM | POA: Diagnosis not present

## 2020-06-18 DIAGNOSIS — R2681 Unsteadiness on feet: Secondary | ICD-10-CM | POA: Diagnosis not present

## 2020-06-18 DIAGNOSIS — R296 Repeated falls: Secondary | ICD-10-CM | POA: Diagnosis not present

## 2020-06-18 DIAGNOSIS — M25561 Pain in right knee: Secondary | ICD-10-CM | POA: Diagnosis not present

## 2020-06-18 DIAGNOSIS — M6281 Muscle weakness (generalized): Secondary | ICD-10-CM | POA: Diagnosis not present

## 2020-06-18 DIAGNOSIS — Z96641 Presence of right artificial hip joint: Secondary | ICD-10-CM | POA: Diagnosis not present

## 2020-06-19 DIAGNOSIS — R296 Repeated falls: Secondary | ICD-10-CM | POA: Diagnosis not present

## 2020-06-19 DIAGNOSIS — R2681 Unsteadiness on feet: Secondary | ICD-10-CM | POA: Diagnosis not present

## 2020-06-19 DIAGNOSIS — M25561 Pain in right knee: Secondary | ICD-10-CM | POA: Diagnosis not present

## 2020-06-19 DIAGNOSIS — M6281 Muscle weakness (generalized): Secondary | ICD-10-CM | POA: Diagnosis not present

## 2020-06-19 DIAGNOSIS — Z96641 Presence of right artificial hip joint: Secondary | ICD-10-CM | POA: Diagnosis not present

## 2020-06-20 DIAGNOSIS — M25561 Pain in right knee: Secondary | ICD-10-CM | POA: Diagnosis not present

## 2020-06-20 DIAGNOSIS — M6281 Muscle weakness (generalized): Secondary | ICD-10-CM | POA: Diagnosis not present

## 2020-06-20 DIAGNOSIS — R296 Repeated falls: Secondary | ICD-10-CM | POA: Diagnosis not present

## 2020-06-20 DIAGNOSIS — Z96641 Presence of right artificial hip joint: Secondary | ICD-10-CM | POA: Diagnosis not present

## 2020-06-20 DIAGNOSIS — R2681 Unsteadiness on feet: Secondary | ICD-10-CM | POA: Diagnosis not present

## 2020-06-23 DIAGNOSIS — R2681 Unsteadiness on feet: Secondary | ICD-10-CM | POA: Diagnosis not present

## 2020-06-23 DIAGNOSIS — Z96641 Presence of right artificial hip joint: Secondary | ICD-10-CM | POA: Diagnosis not present

## 2020-06-23 DIAGNOSIS — R296 Repeated falls: Secondary | ICD-10-CM | POA: Diagnosis not present

## 2020-06-23 DIAGNOSIS — M25561 Pain in right knee: Secondary | ICD-10-CM | POA: Diagnosis not present

## 2020-06-23 DIAGNOSIS — M6281 Muscle weakness (generalized): Secondary | ICD-10-CM | POA: Diagnosis not present

## 2020-06-24 DIAGNOSIS — M25561 Pain in right knee: Secondary | ICD-10-CM | POA: Diagnosis not present

## 2020-06-24 DIAGNOSIS — Z96641 Presence of right artificial hip joint: Secondary | ICD-10-CM | POA: Diagnosis not present

## 2020-06-24 DIAGNOSIS — M6281 Muscle weakness (generalized): Secondary | ICD-10-CM | POA: Diagnosis not present

## 2020-06-24 DIAGNOSIS — R296 Repeated falls: Secondary | ICD-10-CM | POA: Diagnosis not present

## 2020-06-24 DIAGNOSIS — R2681 Unsteadiness on feet: Secondary | ICD-10-CM | POA: Diagnosis not present

## 2020-06-25 DIAGNOSIS — R296 Repeated falls: Secondary | ICD-10-CM | POA: Diagnosis not present

## 2020-06-25 DIAGNOSIS — M25561 Pain in right knee: Secondary | ICD-10-CM | POA: Diagnosis not present

## 2020-06-25 DIAGNOSIS — R2681 Unsteadiness on feet: Secondary | ICD-10-CM | POA: Diagnosis not present

## 2020-06-25 DIAGNOSIS — M6281 Muscle weakness (generalized): Secondary | ICD-10-CM | POA: Diagnosis not present

## 2020-06-25 DIAGNOSIS — Z96641 Presence of right artificial hip joint: Secondary | ICD-10-CM | POA: Diagnosis not present

## 2020-06-26 ENCOUNTER — Telehealth: Payer: Self-pay

## 2020-06-26 NOTE — Telephone Encounter (Signed)
Plan of care for order for OT signed by provider,faxed back to [facility] and placed in scan.

## 2020-06-27 DIAGNOSIS — R296 Repeated falls: Secondary | ICD-10-CM | POA: Diagnosis not present

## 2020-06-27 DIAGNOSIS — M25561 Pain in right knee: Secondary | ICD-10-CM | POA: Diagnosis not present

## 2020-06-27 DIAGNOSIS — R2681 Unsteadiness on feet: Secondary | ICD-10-CM | POA: Diagnosis not present

## 2020-06-27 DIAGNOSIS — M6281 Muscle weakness (generalized): Secondary | ICD-10-CM | POA: Diagnosis not present

## 2020-06-27 DIAGNOSIS — Z96641 Presence of right artificial hip joint: Secondary | ICD-10-CM | POA: Diagnosis not present

## 2020-06-30 DIAGNOSIS — M6281 Muscle weakness (generalized): Secondary | ICD-10-CM | POA: Diagnosis not present

## 2020-06-30 DIAGNOSIS — R296 Repeated falls: Secondary | ICD-10-CM | POA: Diagnosis not present

## 2020-06-30 DIAGNOSIS — R2681 Unsteadiness on feet: Secondary | ICD-10-CM | POA: Diagnosis not present

## 2020-06-30 DIAGNOSIS — M25561 Pain in right knee: Secondary | ICD-10-CM | POA: Diagnosis not present

## 2020-06-30 DIAGNOSIS — Z96641 Presence of right artificial hip joint: Secondary | ICD-10-CM | POA: Diagnosis not present

## 2020-07-01 DIAGNOSIS — R296 Repeated falls: Secondary | ICD-10-CM | POA: Diagnosis not present

## 2020-07-01 DIAGNOSIS — Z96641 Presence of right artificial hip joint: Secondary | ICD-10-CM | POA: Diagnosis not present

## 2020-07-01 DIAGNOSIS — R2681 Unsteadiness on feet: Secondary | ICD-10-CM | POA: Diagnosis not present

## 2020-07-01 DIAGNOSIS — M25561 Pain in right knee: Secondary | ICD-10-CM | POA: Diagnosis not present

## 2020-07-01 DIAGNOSIS — M6281 Muscle weakness (generalized): Secondary | ICD-10-CM | POA: Diagnosis not present

## 2020-07-02 DIAGNOSIS — R296 Repeated falls: Secondary | ICD-10-CM | POA: Diagnosis not present

## 2020-07-02 DIAGNOSIS — M6281 Muscle weakness (generalized): Secondary | ICD-10-CM | POA: Diagnosis not present

## 2020-07-02 DIAGNOSIS — R2681 Unsteadiness on feet: Secondary | ICD-10-CM | POA: Diagnosis not present

## 2020-07-02 DIAGNOSIS — M25561 Pain in right knee: Secondary | ICD-10-CM | POA: Diagnosis not present

## 2020-07-02 DIAGNOSIS — Z96641 Presence of right artificial hip joint: Secondary | ICD-10-CM | POA: Diagnosis not present

## 2020-07-03 DIAGNOSIS — R296 Repeated falls: Secondary | ICD-10-CM | POA: Diagnosis not present

## 2020-07-03 DIAGNOSIS — M25561 Pain in right knee: Secondary | ICD-10-CM | POA: Diagnosis not present

## 2020-07-03 DIAGNOSIS — Z96641 Presence of right artificial hip joint: Secondary | ICD-10-CM | POA: Diagnosis not present

## 2020-07-03 DIAGNOSIS — R2681 Unsteadiness on feet: Secondary | ICD-10-CM | POA: Diagnosis not present

## 2020-07-03 DIAGNOSIS — M6281 Muscle weakness (generalized): Secondary | ICD-10-CM | POA: Diagnosis not present

## 2020-07-04 DIAGNOSIS — R296 Repeated falls: Secondary | ICD-10-CM | POA: Diagnosis not present

## 2020-07-04 DIAGNOSIS — M25561 Pain in right knee: Secondary | ICD-10-CM | POA: Diagnosis not present

## 2020-07-04 DIAGNOSIS — R2681 Unsteadiness on feet: Secondary | ICD-10-CM | POA: Diagnosis not present

## 2020-07-04 DIAGNOSIS — M6281 Muscle weakness (generalized): Secondary | ICD-10-CM | POA: Diagnosis not present

## 2020-07-04 DIAGNOSIS — Z96641 Presence of right artificial hip joint: Secondary | ICD-10-CM | POA: Diagnosis not present

## 2020-07-05 DIAGNOSIS — R2681 Unsteadiness on feet: Secondary | ICD-10-CM | POA: Diagnosis not present

## 2020-07-05 DIAGNOSIS — Z96641 Presence of right artificial hip joint: Secondary | ICD-10-CM | POA: Diagnosis not present

## 2020-07-05 DIAGNOSIS — M6281 Muscle weakness (generalized): Secondary | ICD-10-CM | POA: Diagnosis not present

## 2020-07-05 DIAGNOSIS — R296 Repeated falls: Secondary | ICD-10-CM | POA: Diagnosis not present

## 2020-07-05 DIAGNOSIS — M25561 Pain in right knee: Secondary | ICD-10-CM | POA: Diagnosis not present

## 2020-07-07 DIAGNOSIS — R296 Repeated falls: Secondary | ICD-10-CM | POA: Diagnosis not present

## 2020-07-07 DIAGNOSIS — M25561 Pain in right knee: Secondary | ICD-10-CM | POA: Diagnosis not present

## 2020-07-07 DIAGNOSIS — M6281 Muscle weakness (generalized): Secondary | ICD-10-CM | POA: Diagnosis not present

## 2020-07-07 DIAGNOSIS — Z96641 Presence of right artificial hip joint: Secondary | ICD-10-CM | POA: Diagnosis not present

## 2020-07-07 DIAGNOSIS — R2681 Unsteadiness on feet: Secondary | ICD-10-CM | POA: Diagnosis not present

## 2020-07-08 DIAGNOSIS — R296 Repeated falls: Secondary | ICD-10-CM | POA: Diagnosis not present

## 2020-07-08 DIAGNOSIS — R2681 Unsteadiness on feet: Secondary | ICD-10-CM | POA: Diagnosis not present

## 2020-07-08 DIAGNOSIS — Z96641 Presence of right artificial hip joint: Secondary | ICD-10-CM | POA: Diagnosis not present

## 2020-07-08 DIAGNOSIS — M6281 Muscle weakness (generalized): Secondary | ICD-10-CM | POA: Diagnosis not present

## 2020-07-08 DIAGNOSIS — M25561 Pain in right knee: Secondary | ICD-10-CM | POA: Diagnosis not present

## 2020-07-10 DIAGNOSIS — R296 Repeated falls: Secondary | ICD-10-CM | POA: Diagnosis not present

## 2020-07-10 DIAGNOSIS — M6281 Muscle weakness (generalized): Secondary | ICD-10-CM | POA: Diagnosis not present

## 2020-07-10 DIAGNOSIS — Z96641 Presence of right artificial hip joint: Secondary | ICD-10-CM | POA: Diagnosis not present

## 2020-07-10 DIAGNOSIS — R2681 Unsteadiness on feet: Secondary | ICD-10-CM | POA: Diagnosis not present

## 2020-07-10 DIAGNOSIS — M25561 Pain in right knee: Secondary | ICD-10-CM | POA: Diagnosis not present

## 2020-07-11 DIAGNOSIS — M25561 Pain in right knee: Secondary | ICD-10-CM | POA: Diagnosis not present

## 2020-07-11 DIAGNOSIS — Z96641 Presence of right artificial hip joint: Secondary | ICD-10-CM | POA: Diagnosis not present

## 2020-07-11 DIAGNOSIS — M6281 Muscle weakness (generalized): Secondary | ICD-10-CM | POA: Diagnosis not present

## 2020-07-11 DIAGNOSIS — R2681 Unsteadiness on feet: Secondary | ICD-10-CM | POA: Diagnosis not present

## 2020-07-11 DIAGNOSIS — R296 Repeated falls: Secondary | ICD-10-CM | POA: Diagnosis not present

## 2020-07-14 DIAGNOSIS — Z96641 Presence of right artificial hip joint: Secondary | ICD-10-CM | POA: Diagnosis not present

## 2020-07-14 DIAGNOSIS — R2681 Unsteadiness on feet: Secondary | ICD-10-CM | POA: Diagnosis not present

## 2020-07-14 DIAGNOSIS — M25561 Pain in right knee: Secondary | ICD-10-CM | POA: Diagnosis not present

## 2020-07-14 DIAGNOSIS — M6281 Muscle weakness (generalized): Secondary | ICD-10-CM | POA: Diagnosis not present

## 2020-07-14 DIAGNOSIS — R296 Repeated falls: Secondary | ICD-10-CM | POA: Diagnosis not present

## 2020-07-15 DIAGNOSIS — R296 Repeated falls: Secondary | ICD-10-CM | POA: Diagnosis not present

## 2020-07-15 DIAGNOSIS — M6281 Muscle weakness (generalized): Secondary | ICD-10-CM | POA: Diagnosis not present

## 2020-07-15 DIAGNOSIS — M25561 Pain in right knee: Secondary | ICD-10-CM | POA: Diagnosis not present

## 2020-07-15 DIAGNOSIS — Z96641 Presence of right artificial hip joint: Secondary | ICD-10-CM | POA: Diagnosis not present

## 2020-07-15 DIAGNOSIS — R2681 Unsteadiness on feet: Secondary | ICD-10-CM | POA: Diagnosis not present

## 2020-07-16 DIAGNOSIS — M25561 Pain in right knee: Secondary | ICD-10-CM | POA: Diagnosis not present

## 2020-07-16 DIAGNOSIS — R2681 Unsteadiness on feet: Secondary | ICD-10-CM | POA: Diagnosis not present

## 2020-07-16 DIAGNOSIS — M6281 Muscle weakness (generalized): Secondary | ICD-10-CM | POA: Diagnosis not present

## 2020-07-16 DIAGNOSIS — Z96641 Presence of right artificial hip joint: Secondary | ICD-10-CM | POA: Diagnosis not present

## 2020-07-16 DIAGNOSIS — R296 Repeated falls: Secondary | ICD-10-CM | POA: Diagnosis not present

## 2020-07-17 DIAGNOSIS — R2681 Unsteadiness on feet: Secondary | ICD-10-CM | POA: Diagnosis not present

## 2020-07-17 DIAGNOSIS — M25561 Pain in right knee: Secondary | ICD-10-CM | POA: Diagnosis not present

## 2020-07-17 DIAGNOSIS — Z96641 Presence of right artificial hip joint: Secondary | ICD-10-CM | POA: Diagnosis not present

## 2020-07-17 DIAGNOSIS — M6281 Muscle weakness (generalized): Secondary | ICD-10-CM | POA: Diagnosis not present

## 2020-07-17 DIAGNOSIS — R296 Repeated falls: Secondary | ICD-10-CM | POA: Diagnosis not present

## 2020-07-20 ENCOUNTER — Other Ambulatory Visit: Payer: Self-pay | Admitting: Adult Health

## 2020-07-20 DIAGNOSIS — R350 Frequency of micturition: Secondary | ICD-10-CM

## 2020-07-22 ENCOUNTER — Other Ambulatory Visit: Payer: Self-pay

## 2020-07-22 DIAGNOSIS — M25561 Pain in right knee: Secondary | ICD-10-CM | POA: Diagnosis not present

## 2020-07-22 DIAGNOSIS — J440 Chronic obstructive pulmonary disease with acute lower respiratory infection: Secondary | ICD-10-CM

## 2020-07-22 DIAGNOSIS — Z96641 Presence of right artificial hip joint: Secondary | ICD-10-CM | POA: Diagnosis not present

## 2020-07-22 DIAGNOSIS — M6281 Muscle weakness (generalized): Secondary | ICD-10-CM | POA: Diagnosis not present

## 2020-07-22 DIAGNOSIS — R296 Repeated falls: Secondary | ICD-10-CM | POA: Diagnosis not present

## 2020-07-22 DIAGNOSIS — R2681 Unsteadiness on feet: Secondary | ICD-10-CM | POA: Diagnosis not present

## 2020-07-22 MED ORDER — ALBUTEROL SULFATE HFA 108 (90 BASE) MCG/ACT IN AERS: 2.0000 | INHALATION_SPRAY | RESPIRATORY_TRACT | 3 refills | Status: DC | PRN

## 2020-07-28 DIAGNOSIS — S72031D Displaced midcervical fracture of right femur, subsequent encounter for closed fracture with routine healing: Secondary | ICD-10-CM | POA: Diagnosis not present

## 2020-07-28 DIAGNOSIS — S72001D Fracture of unspecified part of neck of right femur, subsequent encounter for closed fracture with routine healing: Secondary | ICD-10-CM | POA: Diagnosis not present

## 2020-07-28 DIAGNOSIS — M7061 Trochanteric bursitis, right hip: Secondary | ICD-10-CM | POA: Diagnosis not present

## 2020-07-28 DIAGNOSIS — Z96641 Presence of right artificial hip joint: Secondary | ICD-10-CM | POA: Diagnosis not present

## 2020-07-28 DIAGNOSIS — R229 Localized swelling, mass and lump, unspecified: Secondary | ICD-10-CM | POA: Diagnosis not present

## 2020-08-04 DIAGNOSIS — M6281 Muscle weakness (generalized): Secondary | ICD-10-CM | POA: Diagnosis not present

## 2020-08-04 DIAGNOSIS — M25561 Pain in right knee: Secondary | ICD-10-CM | POA: Diagnosis not present

## 2020-08-04 DIAGNOSIS — R2681 Unsteadiness on feet: Secondary | ICD-10-CM | POA: Diagnosis not present

## 2020-08-04 DIAGNOSIS — Z96641 Presence of right artificial hip joint: Secondary | ICD-10-CM | POA: Diagnosis not present

## 2020-08-04 DIAGNOSIS — R296 Repeated falls: Secondary | ICD-10-CM | POA: Diagnosis not present

## 2020-08-08 DIAGNOSIS — Z96641 Presence of right artificial hip joint: Secondary | ICD-10-CM | POA: Diagnosis not present

## 2020-08-08 DIAGNOSIS — M6281 Muscle weakness (generalized): Secondary | ICD-10-CM | POA: Diagnosis not present

## 2020-08-08 DIAGNOSIS — R296 Repeated falls: Secondary | ICD-10-CM | POA: Diagnosis not present

## 2020-08-08 DIAGNOSIS — M25561 Pain in right knee: Secondary | ICD-10-CM | POA: Diagnosis not present

## 2020-08-08 DIAGNOSIS — R2681 Unsteadiness on feet: Secondary | ICD-10-CM | POA: Diagnosis not present

## 2020-08-11 ENCOUNTER — Telehealth: Payer: Self-pay

## 2020-08-11 NOTE — Telephone Encounter (Signed)
Pt called advising that she has a abscess tooth and needing to make appt with denist and asking if she needs to stop the eliquis.  I spoke to [facility] and she advised that she doesn't have to stop the eliquis, only if she was on warfarin.  Pt informed.  dbs

## 2020-08-20 DIAGNOSIS — M25561 Pain in right knee: Secondary | ICD-10-CM | POA: Diagnosis not present

## 2020-08-20 DIAGNOSIS — R296 Repeated falls: Secondary | ICD-10-CM | POA: Diagnosis not present

## 2020-08-20 DIAGNOSIS — M6281 Muscle weakness (generalized): Secondary | ICD-10-CM | POA: Diagnosis not present

## 2020-08-20 DIAGNOSIS — R2681 Unsteadiness on feet: Secondary | ICD-10-CM | POA: Diagnosis not present

## 2020-08-21 ENCOUNTER — Other Ambulatory Visit: Payer: Self-pay

## 2020-08-21 DIAGNOSIS — L209 Atopic dermatitis, unspecified: Secondary | ICD-10-CM

## 2020-08-21 DIAGNOSIS — L239 Allergic contact dermatitis, unspecified cause: Secondary | ICD-10-CM

## 2020-08-21 MED ORDER — DESLORATADINE 5 MG PO TABS: 5.0000 mg | ORAL_TABLET | Freq: Every day | ORAL | 3 refills | Status: DC

## 2020-08-26 ENCOUNTER — Encounter: Payer: BLUE CROSS/BLUE SHIELD | Admitting: Nurse Practitioner

## 2020-08-29 ENCOUNTER — Other Ambulatory Visit: Payer: Self-pay

## 2020-08-29 DIAGNOSIS — L209 Atopic dermatitis, unspecified: Secondary | ICD-10-CM

## 2020-08-29 MED ORDER — TRIAMCINOLONE ACETONIDE 0.025 % EX CREA: 1.0000 "application " | TOPICAL_CREAM | Freq: Two times a day (BID) | CUTANEOUS | 3 refills | Status: DC

## 2020-09-01 DIAGNOSIS — R296 Repeated falls: Secondary | ICD-10-CM | POA: Diagnosis not present

## 2020-09-01 DIAGNOSIS — M25561 Pain in right knee: Secondary | ICD-10-CM | POA: Diagnosis not present

## 2020-09-01 DIAGNOSIS — R2681 Unsteadiness on feet: Secondary | ICD-10-CM | POA: Diagnosis not present

## 2020-09-01 DIAGNOSIS — M6281 Muscle weakness (generalized): Secondary | ICD-10-CM | POA: Diagnosis not present

## 2020-09-08 ENCOUNTER — Emergency Department: Payer: Medicare Other

## 2020-09-08 ENCOUNTER — Inpatient Hospital Stay
Admission: EM | Admit: 2020-09-08 | Discharge: 2020-09-10 | DRG: 812 | Disposition: A | Discharge status: Home Health | Payer: Medicare Other | Attending: Internal Medicine | Admitting: Internal Medicine

## 2020-09-08 ENCOUNTER — Other Ambulatory Visit: Payer: Self-pay

## 2020-09-08 ENCOUNTER — Encounter: Payer: Self-pay | Admitting: Emergency Medicine

## 2020-09-08 DIAGNOSIS — R32 Unspecified urinary incontinence: Secondary | ICD-10-CM | POA: Diagnosis present

## 2020-09-08 DIAGNOSIS — I482 Chronic atrial fibrillation, unspecified: Secondary | ICD-10-CM | POA: Diagnosis not present

## 2020-09-08 DIAGNOSIS — E1142 Type 2 diabetes mellitus with diabetic polyneuropathy: Secondary | ICD-10-CM | POA: Diagnosis present

## 2020-09-08 DIAGNOSIS — Z96641 Presence of right artificial hip joint: Secondary | ICD-10-CM | POA: Diagnosis not present

## 2020-09-08 DIAGNOSIS — E538 Deficiency of other specified B group vitamins: Secondary | ICD-10-CM | POA: Diagnosis present

## 2020-09-08 DIAGNOSIS — Z79899 Other long term (current) drug therapy: Secondary | ICD-10-CM

## 2020-09-08 DIAGNOSIS — I480 Paroxysmal atrial fibrillation: Secondary | ICD-10-CM | POA: Diagnosis present

## 2020-09-08 DIAGNOSIS — J449 Chronic obstructive pulmonary disease, unspecified: Secondary | ICD-10-CM | POA: Diagnosis present

## 2020-09-08 DIAGNOSIS — D649 Anemia, unspecified: Secondary | ICD-10-CM | POA: Diagnosis not present

## 2020-09-08 DIAGNOSIS — R0600 Dyspnea, unspecified: Secondary | ICD-10-CM

## 2020-09-08 DIAGNOSIS — K5791 Diverticulosis of intestine, part unspecified, without perforation or abscess with bleeding: Secondary | ICD-10-CM | POA: Diagnosis present

## 2020-09-08 DIAGNOSIS — Z886 Allergy status to analgesic agent status: Secondary | ICD-10-CM

## 2020-09-08 DIAGNOSIS — I10 Essential (primary) hypertension: Secondary | ICD-10-CM | POA: Diagnosis present

## 2020-09-08 DIAGNOSIS — Z880 Allergy status to penicillin: Secondary | ICD-10-CM

## 2020-09-08 DIAGNOSIS — Z8673 Personal history of transient ischemic attack (TIA), and cerebral infarction without residual deficits: Secondary | ICD-10-CM

## 2020-09-08 DIAGNOSIS — S72001D Fracture of unspecified part of neck of right femur, subsequent encounter for closed fracture with routine healing: Secondary | ICD-10-CM | POA: Diagnosis not present

## 2020-09-08 DIAGNOSIS — Z882 Allergy status to sulfonamides status: Secondary | ICD-10-CM

## 2020-09-08 DIAGNOSIS — E119 Type 2 diabetes mellitus without complications: Secondary | ICD-10-CM

## 2020-09-08 DIAGNOSIS — M109 Gout, unspecified: Secondary | ICD-10-CM | POA: Diagnosis present

## 2020-09-08 DIAGNOSIS — E7800 Pure hypercholesterolemia, unspecified: Secondary | ICD-10-CM | POA: Diagnosis present

## 2020-09-08 DIAGNOSIS — D509 Iron deficiency anemia, unspecified: Secondary | ICD-10-CM | POA: Diagnosis not present

## 2020-09-08 DIAGNOSIS — Z7901 Long term (current) use of anticoagulants: Secondary | ICD-10-CM

## 2020-09-08 DIAGNOSIS — R0602 Shortness of breath: Secondary | ICD-10-CM | POA: Diagnosis not present

## 2020-09-08 DIAGNOSIS — Z888 Allergy status to other drugs, medicaments and biological substances status: Secondary | ICD-10-CM

## 2020-09-08 DIAGNOSIS — Z7984 Long term (current) use of oral hypoglycemic drugs: Secondary | ICD-10-CM

## 2020-09-08 DIAGNOSIS — D75839 Thrombocytosis, unspecified: Secondary | ICD-10-CM | POA: Diagnosis present

## 2020-09-08 DIAGNOSIS — E785 Hyperlipidemia, unspecified: Secondary | ICD-10-CM | POA: Diagnosis present

## 2020-09-08 DIAGNOSIS — Z809 Family history of malignant neoplasm, unspecified: Secondary | ICD-10-CM

## 2020-09-08 DIAGNOSIS — Z833 Family history of diabetes mellitus: Secondary | ICD-10-CM

## 2020-09-08 DIAGNOSIS — Z20822 Contact with and (suspected) exposure to covid-19: Secondary | ICD-10-CM | POA: Diagnosis not present

## 2020-09-08 DIAGNOSIS — S72031D Displaced midcervical fracture of right femur, subsequent encounter for closed fracture with routine healing: Secondary | ICD-10-CM | POA: Diagnosis not present

## 2020-09-08 DIAGNOSIS — G2581 Restless legs syndrome: Secondary | ICD-10-CM | POA: Diagnosis present

## 2020-09-08 DIAGNOSIS — Z87891 Personal history of nicotine dependence: Secondary | ICD-10-CM

## 2020-09-08 DIAGNOSIS — R0609 Other forms of dyspnea: Secondary | ICD-10-CM

## 2020-09-08 DIAGNOSIS — Z7951 Long term (current) use of inhaled steroids: Secondary | ICD-10-CM

## 2020-09-08 DIAGNOSIS — R079 Chest pain, unspecified: Secondary | ICD-10-CM | POA: Diagnosis not present

## 2020-09-08 DIAGNOSIS — Z86718 Personal history of other venous thrombosis and embolism: Secondary | ICD-10-CM

## 2020-09-08 DIAGNOSIS — Z96643 Presence of artificial hip joint, bilateral: Secondary | ICD-10-CM | POA: Diagnosis present

## 2020-09-08 DIAGNOSIS — Z885 Allergy status to narcotic agent status: Secondary | ICD-10-CM

## 2020-09-08 LAB — BASIC METABOLIC PANEL
Anion gap: 11 (ref 5–15)
BUN: 12 mg/dL (ref 8–23)
CO2: 23 mmol/L (ref 22–32)
Calcium: 9.6 mg/dL (ref 8.9–10.3)
Chloride: 101 mmol/L (ref 98–111)
Creatinine, Ser: 0.64 mg/dL (ref 0.44–1.00)
GFR, Estimated: >60 mL/min (ref >60)
Glucose, Bld: 117 mg/dL — ABNORMAL HIGH (ref 70–99)
Potassium: 3.8 mmol/L (ref 3.5–5.1)
Sodium: 135 mmol/L (ref 135–145)

## 2020-09-08 LAB — PREPARE RBC (CROSSMATCH)

## 2020-09-08 LAB — HEMOGLOBIN AND HEMATOCRIT, BLOOD
HCT: 26.6 % — ABNORMAL LOW (ref 36.0–46.0)
Hemoglobin: 7.5 g/dL — ABNORMAL LOW (ref 12.0–15.0)

## 2020-09-08 LAB — RETICULOCYTES
Immature Retic Fract: 25.2 % — ABNORMAL HIGH (ref 2.3–15.9)
RBC.: 3.62 MIL/uL — ABNORMAL LOW (ref 3.87–5.11)
Retic Count, Absolute: 64.1 10*3/uL (ref 19.0–186.0)
Retic Ct Pct: 1.8 % (ref 0.4–3.1)

## 2020-09-08 LAB — HEPATIC FUNCTION PANEL
ALT: 15 U/L (ref 0–44)
AST: 23 U/L (ref 15–41)
Albumin: 4.2 g/dL (ref 3.5–5.0)
Alkaline Phosphatase: 71 U/L (ref 38–126)
Bilirubin, Direct: <0.1 mg/dL (ref 0.0–0.2)
Total Bilirubin: 0.7 mg/dL (ref 0.3–1.2)
Total Protein: 7.8 g/dL (ref 6.5–8.1)

## 2020-09-08 LAB — PROTIME-INR
INR: 1.1 (ref 0.8–1.2)
Prothrombin Time: 14.2 seconds (ref 11.4–15.2)

## 2020-09-08 LAB — CBC
HCT: 24.9 % — ABNORMAL LOW (ref 36.0–46.0)
Hemoglobin: 7.1 g/dL — ABNORMAL LOW (ref 12.0–15.0)
MCH: 19.3 pg — ABNORMAL LOW (ref 26.0–34.0)
MCHC: 28.5 g/dL — ABNORMAL LOW (ref 30.0–36.0)
MCV: 67.7 fL — ABNORMAL LOW (ref 80.0–100.0)
Platelets: 435 10*3/uL — ABNORMAL HIGH (ref 150–400)
RBC: 3.68 MIL/uL — ABNORMAL LOW (ref 3.87–5.11)
RDW: 17.5 % — ABNORMAL HIGH (ref 11.5–15.5)
WBC: 7.5 10*3/uL (ref 4.0–10.5)
nRBC: 0 % (ref 0.0–0.2)

## 2020-09-08 LAB — FERRITIN: Ferritin: 5 ng/mL — ABNORMAL LOW (ref 11–307)

## 2020-09-08 LAB — RESPIRATORY PANEL BY RT PCR (FLU A&B, COVID)
Influenza A by PCR: NEGATIVE
Influenza B by PCR: NEGATIVE
SARS Coronavirus 2 by RT PCR: NEGATIVE

## 2020-09-08 LAB — GLUCOSE, CAPILLARY
Glucose-Capillary: 96 mg/dL (ref 70–99)
Glucose-Capillary: 138 mg/dL — ABNORMAL HIGH (ref 70–99)

## 2020-09-08 LAB — TROPONIN I (HIGH SENSITIVITY)
Troponin I (High Sensitivity): 10 ng/L (ref <18)
Troponin I (High Sensitivity): 11 ng/L (ref <18)

## 2020-09-08 LAB — IRON AND TIBC
Iron: 12 ug/dL — ABNORMAL LOW (ref 28–170)
Saturation Ratios: 2 % — ABNORMAL LOW (ref 10.4–31.8)
TIBC: 575 ug/dL — ABNORMAL HIGH (ref 250–450)
UIBC: 563 ug/dL

## 2020-09-08 LAB — FOLATE: Folate: 19.8 ng/mL (ref >5.9)

## 2020-09-08 MED ORDER — SODIUM CHLORIDE 0.9 % IV SOLN: 10.0000 mL/h | Freq: Once | INTRAVENOUS | Status: DC

## 2020-09-08 MED ORDER — AMLODIPINE BESYLATE 5 MG PO TABS
5.0000 mg | ORAL_TABLET | Freq: Every day | ORAL | Status: DC
  Administered 2020-09-08 – 2020-09-10 (×3): 5 mg via ORAL
  Filled 2020-09-08 (×3): qty 1

## 2020-09-08 MED ORDER — FLUTICASONE FUROATE-VILANTEROL 100-25 MCG/INH IN AEPB
1.0000 | INHALATION_SPRAY | Freq: Every day | RESPIRATORY_TRACT | Status: DC
  Administered 2020-09-09 – 2020-09-10 (×2): 1 via RESPIRATORY_TRACT
  Filled 2020-09-08: qty 28

## 2020-09-08 MED ORDER — HYDRALAZINE HCL 20 MG/ML IJ SOLN: 10.0000 mg | INTRAMUSCULAR | Status: DC | PRN

## 2020-09-08 MED ORDER — BENAZEPRIL HCL 20 MG PO TABS
20.0000 mg | ORAL_TABLET | Freq: Every day | ORAL | Status: DC
  Administered 2020-09-08 – 2020-09-10 (×3): 20 mg via ORAL
  Filled 2020-09-08 (×4): qty 1

## 2020-09-08 MED ORDER — ALBUTEROL SULFATE (2.5 MG/3ML) 0.083% IN NEBU: 3.0000 mL | INHALATION_SOLUTION | RESPIRATORY_TRACT | Status: DC | PRN

## 2020-09-08 MED ORDER — ONDANSETRON HCL 4 MG/2ML IJ SOLN: 4.0000 mg | Freq: Four times a day (QID) | INTRAMUSCULAR | Status: DC | PRN

## 2020-09-08 MED ORDER — GABAPENTIN 300 MG PO CAPS
600.0000 mg | ORAL_CAPSULE | Freq: Every day | ORAL | Status: DC
  Administered 2020-09-08 – 2020-09-09 (×2): 600 mg via ORAL
  Filled 2020-09-08 (×2): qty 2

## 2020-09-08 MED ORDER — THEOPHYLLINE ER 100 MG PO CP24
100.0000 mg | ORAL_CAPSULE | Freq: Every day | ORAL | Status: DC
  Administered 2020-09-09 – 2020-09-10 (×2): 100 mg via ORAL
  Filled 2020-09-08 (×2): qty 1

## 2020-09-08 MED ORDER — INSULIN ASPART 100 UNIT/ML ~~LOC~~ SOLN
0.0000 [IU] | Freq: Four times a day (QID) | SUBCUTANEOUS | Status: DC
  Administered 2020-09-09: 1 [IU] via SUBCUTANEOUS

## 2020-09-08 MED ORDER — ONDANSETRON HCL 4 MG PO TABS: 4.0000 mg | ORAL_TABLET | Freq: Four times a day (QID) | ORAL | Status: DC | PRN

## 2020-09-08 NOTE — H&P (Signed)
History and Physical    PLEASE NOTE THAT DRAGON DICTATION SOFTWARE WAS USED IN THE CONSTRUCTION OF THIS NOTE.   KIMBERL VIG NAT:[id number] DOB: 1926/03/07 DOA: 09/08/2020  PCP: [name], NP Patient coming from: home   I have personally briefly reviewed patient's old medical records in [place]  Chief Complaint: Shortness of breath  HPI: Monica [name] is a 86 y.o. female with medical history significant for chronic iron deficiency/B12 deficiency anemia associate with baseline hemoglobin of 8-10, diverticulosis, lower GI bleed, hypertension, type 2 diabetes mellitus, paroxysmal atrial fibrillation chronically anticoagulated on Eliquis, urinary retention who is admitted to [facility] on 09/08/2020 with acute on chronic symptomatic anemia after presenting from home to [facility] Emergency Department complaining of shortness of breath.   The following history is provided by the patient as well as my discussions with the patient's daughter, who is present at bedside, my discussions with the emergency department physician, and via chart review.  The patient reports 3 to 4 days of progressive shortness of breath, worse with exertion in the absence of any associated orthopnea, PND, or peripheral edema.  She denies any associated chest pain, diaphoresis, or palpitations.  Reports associated lightheadedness, but denies any recent presyncope or syncope.  No recent trauma.  Denies any recent subjective fever, chills, rigors, or generalized myalgias.  Denies any associated headache, neck stiffness, rhinitis, rhinorrhea, sore throat, cough, or rash.  No recent traveling or known COVID-19 exposures.  Denies any recent dysuria, gross hematuria, or change in urinary frequency.  The patient and her daughter acknowledge that the patient experienced an acute lower gastrointestinal bleed in March 2019, at which time she underwent EGD/colonoscopy, with the latter notable  for suspected recent diverticular bleed.  They confirm that the patient has undergone no subsequent endoscopic evaluations following these March 2019 studies.  The patient reports that the shortness of breath that she has been experiencing over the last few days similar to that that she was experiencing at the time of her acute on chronic anemia in the setting of acute lower gastrointestinal bleed in March 2019.  She denies any recent nausea, vomiting, hematemesis, abdominal pain, diarrhea, melena, or hematochezia.  In the setting of paroxysmal atrial fibrillation, she is chronically anticoagulated on Eliquis.  Otherwise, no blood thinning agents at home, including no aspirin.   Per chart review, it appears that the patient's chronic anemia is multifactorial nature, with contributions from iron deficiency anemia for which she is on chronic oral iron therapy as well as a contribution from from vitamin B12 deficiency.  Her baseline hemoglobin appears to be in the range of 8-10.    ED Course:  Vital signs in the ED were notable for the following: Temperature max 98.3; heart rate 58-69; blood pressure 156/57; respirate 1718, oxygen saturation 97 to 99% on room air.  Labs were notable for the following: CMP notable for the following: Sodium 135 BUN 12 relative to most recent prior value 13 May 2020, creatinine 0.64.  High-sensitivity troponin I x1 found to be 10.  CBC notable for hemoglobin 7.1 relative to most recent prior value of 8.2 on 04/24/2020.  Platelets 435.  INR 1.1 fecal occult blood test result currently pending.  Routine screening nasopharyngeal COVID-19 PCR as well as influenza PCR results performed in the ED this evening are currently pending.  Chest x-ray showed evidence of acute cardiopulmonary process.  While in the ED, the following were administered: The patient was typed and  screened for 1 unit PRBC, with transfusion of this unit and while still in the ED.  Subsequently, the patient was  admitted for overnight observation to the med telemetry floor for further evaluation and management of acute on chronic symptomatic anemia.     Review of Systems: As per HPI otherwise 10 point review of systems negative.   Past Medical History:  Diagnosis Date  . Atrial fibrillation ([place])   . Bronchitis   . Cancer ([place])    Left leg growth, kidneys, lungs and breasts  . Carcinoma of unknown primary ([place])   . COPD (chronic obstructive pulmonary disease) ([place])   . Diabetes mellitus without complication ([place])   . Hyperlipidemia   . Hypertension   . Pancreatitis   . Pneumonia   . Stroke ([place])    TIA's  . Vitamin B12 deficiency     Past Surgical History:  Procedure Laterality Date  . ABDOMINAL HYSTERECTOMY  1975  . BLADDER SUSPENSION  1989  . CAROTID ANGIOGRAPHY Right 11/23/2019   Procedure: CAROTID ANGIOGRAPHY;  Surgeon: Katha Cabal, MD;  Location: [facility] LAB;  Service: Cardiovascular;  Laterality: Right;  . COLONOSCOPY    . COLONOSCOPY WITH PROPOFOL N/A 01/25/2018   Procedure: COLONOSCOPY WITH PROPOFOL;  Surgeon: [name], MD;  Location: [facility] ENDOSCOPY;  Service: Endoscopy;  Laterality: N/A;  . ESOPHAGOGASTRODUODENOSCOPY (EGD) WITH PROPOFOL N/A 01/24/2018   Procedure: ESOPHAGOGASTRODUODENOSCOPY (EGD) WITH PROPOFOL;  Surgeon: [name], MD;  Location: [facility] ENDOSCOPY;  Service: Endoscopy;  Laterality: N/A;  . HIP ARTHROPLASTY Left 06/03/2019   Procedure: ARTHROPLASTY BIPOLAR HIP (HEMIARTHROPLASTY);  Surgeon: Dereck Leep, MD;  Location: [facility] ORS;  Service: Orthopedics;  Laterality: Left;  . HIP ARTHROPLASTY Right 04/21/2020   Procedure: ARTHROPLASTY BIPOLAR HIP (HEMIARTHROPLASTY);  Surgeon: Corky Mull, MD;  Location: [facility] ORS;  Service: Orthopedics;  Laterality: Right;    Social History:  reports that she quit smoking about 31 years ago. Her smoking use included cigarettes. She has a 7.50 pack-year smoking history. She has never used smokeless tobacco. She  reports that she does not drink alcohol and does not use drugs.   Allergies  Allergen Reactions  . Sulfa Antibiotics Swelling  . Celecoxib Nausea And Vomiting  . Acetaminophen Itching  . Codeine Rash  . Lyrica [Pregabalin] Rash  . Penicillin G Rash    Has patient had a PCN reaction causing immediate rash, facial/tongue/throat swelling, SOB or lightheadedness with hypotension: Yes Has patient had a PCN reaction causing severe rash involving mucus membranes or skin necrosis: No Has patient had a PCN reaction that required hospitalization: No Has patient had a PCN reaction occurring within the last 10 years: Unknown If all of the above answers are "NO", then may proceed with Cephalosporin use.  Marland Kitchen Petrolatum-Zinc Oxide Rash    Family History  Problem Relation Age of Onset  . Cancer Father   . Diabetes Brother      Prior to Admission medications   Medication Sig Start Date End Date Taking? Authorizing Provider  albuterol (VENTOLIN HFA) 108 (90 Base) MCG/ACT inhaler Inhale 2 puffs into the lungs every 4 (four) hours as needed for wheezing or shortness of breath. 07/22/20   [name], NP  amLODipine-benazepril (LOTREL) 5-20 MG capsule Take 1 capsule by mouth daily. 03/19/20   [name], NP  b complex vitamins capsule Take 1 capsule by mouth daily.    [provider]  desloratadine (CLARINEX) 5 MG tablet Take 1 tablet (5 mg total)  by mouth daily. 08/21/20   [name], NP  docusate sodium (COLACE) 100 MG capsule Take 1 capsule (100 mg total) by mouth 2 (two) times daily as needed for mild constipation. 04/23/20   Enzo Bi, MD  ELIQUIS 2.5 MG TABS tablet TAKE ONE TABLET TWICE DAILY Patient taking differently: Take 2.5 mg by mouth 2 (two) times daily.  06/17/20   Kendell Bane, NP  feeding supplement (BOOST HIGH PROTEIN) LIQD Take 1 Container by mouth as needed.    [provider]  feeding supplement, ENSURE ENLIVE, (ENSURE ENLIVE) LIQD Take 237 mLs by  mouth 2 (two) times daily between meals. Patient not taking: Reported on 05/27/2020 04/23/20   Enzo Bi, MD  Ferrous Sulfate (IRON) 28 MG TABS Take 1 tablet (28 mg total) by mouth daily. Or any iron supplements over-the-counter. 04/23/20   Enzo Bi, MD  fluticasone furoate-vilanterol (BREO ELLIPTA) 100-25 MCG/INH AEPB TAKE 1 PUFF ONCE A DAY 02/14/20   Kendell Bane, NP  furosemide (LASIX) 20 MG tablet Take 1 tablet (20 mg total) by mouth daily as needed for fluid or edema. Patient not taking: Reported on 05/27/2020 11/25/19   Dhungel, Flonnie Overman, MD  gabapentin (NEURONTIN) 300 MG capsule Take 2 capsules (600 mg total) by mouth at bedtime. 03/19/20   [name], NP  glucose blood (ONETOUCH ULTRA) test strip 1 each by Other route daily. Use as instructed 07/26/19   [name], NP  hydrochlorothiazide (HYDRODIURIL) 25 MG tablet Take 1 tablet (25 mg total) by mouth daily. 02/01/20   [name], NP  Liniments (SALONPAS PAIN RELIEF PATCH EX) Apply topically. Patient not taking: Reported on 05/27/2020    [provider]  Magnesium 250 MG TABS Take 250 mg by mouth daily at 6 (six) AM. 10/26/19   [provider]  metFORMIN (GLUCOPHAGE-XR) 500 MG 24 hr tablet Take 2 tablets (1,000 mg total) by mouth daily. 04/18/20   [name], NP  methocarbamol (ROBAXIN) 500 MG tablet Take 1 tablet (500 mg total) by mouth every 8 (eight) hours as needed for muscle spasms. 04/23/20   Enzo Bi, MD  montelukast (SINGULAIR) 10 MG tablet Take 10 mg by mouth at bedtime.    [provider]  Multiple Vitamin (MULTIVITAMIN WITH MINERALS) TABS tablet Take 1 tablet by mouth daily. Patient not taking: Reported on 05/27/2020 04/24/20   Enzo Bi, MD  nitrofurantoin, macrocrystal-monohydrate, (MACROBID) 100 MG capsule Take 1 capsule (100 mg total) by mouth 2 (two) times daily. 05/27/20   Kendell Bane, NP  nystatin cream (MYCOSTATIN) Apply topically 2 (two) times daily. 07/18/19   [name],  NP  oxybutynin (DITROPAN-XL) 10 MG 24 hr tablet TAKE 1 TABLET BY MOUTH DAILY Patient taking differently: Take 10 mg by mouth daily.  07/22/20   Lavera Guise, MD  oxyCODONE (OXY IR/ROXICODONE) 5 MG immediate release tablet Take 1 tablet (5 mg total) by mouth every 6 (six) hours as needed for moderate pain. Patient not taking: Reported on 09/08/2020 04/23/20   Lattie Corns, PA-C  senna-docusate (SENOKOT-S) 8.6-50 MG tablet Take 1 tablet by mouth at bedtime as needed for mild constipation. 04/23/20   Enzo Bi, MD  sodium chloride 1 g tablet Take 1 tablet (1 g total) by mouth 3 (three) times daily with meals. 06/09/19   Dustin Flock, MD  theophylline (THEO-24) 100 MG 24 hr capsule TAKE 1 CAPSULE BY MOUTH EVERY DAY Patient taking differently: Take 100 mg by mouth daily.  TAKE 1 CAPSULE BY MOUTH EVERY DAY 06/17/20   Kendell Bane, NP  triamcinolone (KENALOG) 0.025 % cream Apply 1 application topically 2 (two) times daily. Ok to mix with topical lotion and apply to all affected areas. 08/29/20   [name], NP  famotidine (PEPCID) 20 MG tablet Take 1 tablet (20 mg total) by mouth 2 (two) times daily. 08/11/16 11/28/19  Epifanio Lesches, MD  pravastatin (PRAVACHOL) 40 MG tablet Take 1 tablet (40 mg total) by mouth daily. 08/15/19 11/28/19  [name], NP     Objective    Physical Exam: Vitals:   09/08/20 1229 09/08/20 1230 09/08/20 1600  BP: (!) 156/57  (!) 176/59  Pulse: 69  (!) 58  Resp: 18  17  Temp: 98.3 F (36.8 C)    TempSrc: Oral    SpO2: 99%  97%  Weight:  66.7 kg     General: appears to be stated age; alert, oriented Skin: warm, dry, no rash Head:  AT/[place] Eyes:  PEARL b/l, EOMI Mouth:  Oral mucosa membranes appear dry, normal dentition Neck: supple trachea midline Heart:  RRR; did not appreciate any M/R/G Lungs: CTAB, did not appreciate any wheezes, rales, or rhonchi Abdomen: + BS; soft, ND, NT Vascular: 2+ pedal pulses b/l; 2+ radial pulses  b/l Extremities: no peripheral edema, no muscle wasting Neuro: strength and sensation intact in upper and lower extremities b/l   Labs on Admission: I have personally reviewed following labs and imaging studies  CBC: Recent Labs  Lab 09/08/20 1232  WBC 7.5  HGB 7.1*  HCT 24.9*  MCV 67.7*  PLT 357*   Basic Metabolic Panel: Recent Labs  Lab 09/08/20 1232  NA 135  K 3.8  CL 101  CO2 23  GLUCOSE 117*  BUN 12  CREATININE 0.64  CALCIUM 9.6   GFR: Estimated Creatinine Clearance: 41.2 mL/min (by C-G formula based on SCr of 0.64 mg/dL). Liver Function Tests: Recent Labs  Lab 09/08/20 1538  AST 23  ALT 15  ALKPHOS 71  BILITOT 0.7  PROT 7.8  ALBUMIN 4.2   No results for input(s): LIPASE, AMYLASE in the last 168 hours. No results for input(s): AMMONIA in the last 168 hours. Coagulation Profile: Recent Labs  Lab 09/08/20 1538  INR 1.1   Cardiac Enzymes: No results for input(s): CKTOTAL, CKMB, CKMBINDEX, TROPONINI in the last 168 hours. BNP (last 3 results) No results for input(s): PROBNP in the last 8760 hours. HbA1C: No results for input(s): HGBA1C in the last 72 hours. CBG: No results for input(s): GLUCAP in the last 168 hours. Lipid Profile: No results for input(s): CHOL, HDL, LDLCALC, TRIG, CHOLHDL, LDLDIRECT in the last 72 hours. Thyroid Function Tests: No results for input(s): TSH, T4TOTAL, FREET4, T3FREE, THYROIDAB in the last 72 hours. Anemia Panel: No results for input(s): VITAMINB12, FOLATE, FERRITIN, TIBC, IRON, RETICCTPCT in the last 72 hours. Urine analysis:    Component Value Date/Time   COLORURINE YELLOW (A) 06/08/2019 2110   APPEARANCEUR Cloudy (A) 08/21/2019 1350   LABSPEC 1.005 06/08/2019 2110   LABSPEC 1.012 03/04/2014 1535   PHURINE 5.0 06/08/2019 2110   GLUCOSEU Negative 08/21/2019 1350   GLUCOSEU 50 mg/dL 03/04/2014 1535   HGBUR NEGATIVE 06/08/2019 2110   BILIRUBINUR neg 05/27/2020 1240   BILIRUBINUR Negative 08/21/2019 1350    BILIRUBINUR Negative 03/04/2014 [place] 06/08/2019 2110   PROTEINUR Negative 05/27/2020 1240   PROTEINUR Negative 08/21/2019 [place] 06/08/2019 2110  UROBILINOGEN 0.2 05/27/2020 1240   NITRITE positive 05/27/2020 1240   NITRITE Negative 08/21/2019 1350   NITRITE NEGATIVE 06/08/2019 2110   LEUKOCYTESUR Moderate (2+) (A) 05/27/2020 1240   LEUKOCYTESUR 3+ (A) 08/21/2019 1350   LEUKOCYTESUR SMALL (A) 06/08/2019 2110   LEUKOCYTESUR 3+ 03/04/2014 1535    Radiological Exams on Admission: DG Chest 2 View  Result Date: 09/08/2020 CLINICAL DATA:  Chest pain and short of breath EXAM: CHEST - 2 VIEW COMPARISON:  04/19/2020 FINDINGS: Improved aeration of the bases since the prior study. Residual linear density in the left base compatible with scar or atelectasis. Mild right lower lobe atelectasis/infiltrate. Lungs show mild hyperinflation. Heart size within normal limits. Negative for heart failure. Atherosclerotic aortic arch. IMPRESSION: No acute abnormality. Probable chronic lung disease with mild scarring in the bases. Electronically Signed   By: Franchot Gallo M.D.   On: 09/08/2020 14:04     Assessment/Plan   86 y.o. female with medical history significant for chronic iron deficiency/B12 deficiency anemia associate with baseline hemoglobin of 8-10, diverticulosis, lower GI bleed, hypertension, type 2 diabetes mellitus, paroxysmal atrial fibrillation chronically anticoagulated on Eliquis, urinary retention who is admitted to [facility] on 09/08/2020 with acute on chronic symptomatic anemia after presenting from home to [facility] Emergency Department complaining of shortness of breath.    Principal Problem:   Acute on chronic anemia Active Problems:   Diabetes ([place])   B12 deficiency   Essential hypertension   Chronic obstructive pulmonary disease (HCC)   SOB (shortness of breath)   #) Acute on chronic symptomatic anemia:  In the context of chronic anemia in the setting of chronic iron deficiency anemia as well as B12 deficiency, with baseline hemoglobin of 8-10, the patient presents with relative decline in hemoglobin to 7.1 associated with shortness of breath appears to be consistent with symptomatic acute on chronic anemia.  In terms of etiology leading to acute exacerbation of chronic anemia, the differential includes the possibility of acute lower gastrointestinal bleeding, particularly in the setting of history of acute lower gastrointestinal bleed in the setting of diverticular bleed.  Acute upper gastrointestinal bleed appears less likely given the patient's apparent hemodynamic stability as well as finding of a BUN value of has trended down since June 2021.  No evidence of bright red blood per rectum, fecal occult blood test currently pending.  We will also check a vitamin B12 level as well as add on iron studies to pretransfusion specimen to determine if patient would benefit from IV iron transfusion.  Of note, she is chronically anticoagulated on Eliquis in the setting of paroxysmal atrial fibrillation, but is otherwise on no blood thinning agents as an outpatient.  INR 1.1 she is currently in the process of receiving transfusion of 1 unit PRBC, with plan to repeat hemoglobin following conclusion of this transfusion.  There do not appear to be any indications for urgent overnight endoscopic evaluation at this time, but will plan to reevaluate need for gastroenterology consultation in the morning given interval trend in hemoglobin as well as overall clinical course and response to PRBC transfusion. In the setting of presenting shortness of breath also check EKG, although ACS is felt to be unlikely in the absence of any chest discomfort as well as a negative troponin following 3 to 4 days of symptoms.   Plan: Continue transfusion 1 unit PRBC.  Have ordered a recheck of hemoglobin to occur 30 minutes following completion of  this 1 unit PRBC.  We  will also check CBC with the morning labs and H&H at 9 AM on 09/09/2020.  Repeat INR in the morning.  Add on iron studies to pretransfusion labs.  Check vitamin B12 level.  Monitor on telemetry.  Monitor monitor continuous pulse oximetry.  Clear liquid diet overnight.  We will hold this evening's dose of Eliquis.  Check folic acid level.  Add on reticulocyte count to pretransfusion specimen to evaluate for any component of hypoproliferation.  Send peripheral smear to pathologist for evaluation of hemolysis.     #) Essential hypertension: Outpatient hypertensive regimen consists of amlodipine, benazepril, and HCTZ.  Systolic blood pressure running mildly hypertensive in the 150's mmHG, in the absence of any associated hypotension.  We will cautiously begin to resume some of outpatient hypertensive regimen status to reduce risk of bleed that may be promoted by hypertension.   Plan: Resume amlodipine and benazepril.  We will continue to hold home HCTZ.  Close monitoring of ensuing blood pressure via routine vital signs.     #) Type 2 diabetes mellitus: Complicated by peripheral neuropathy.  On Metformin as her sole outpatient oral hypoglycemic agent.  Not on any exogenous insulin at home.    Plan: Hold Metformin.  Accu-Cheks every 6 hours with low-dose sliding scale insulin.      #) COPD: Documented history of such in the setting of being a former smoker, after quitting approximately 30 years ago.  Outpatient respiratory regimen consists of Breo Ellipta as well as as needed albuterol inhaler, and theophylline.  No evidence of acute COPD exacerbation at this time.   Plan:-Continue home [facility] as well as as needed albuterol inhaler.  Continue home theophylline.     #) Paroxysmal atrial fibrillation: Documented history of such. In the setting of a CHA2DS2-VASc score of7, there is an indication for the patient to be on chronic anticoagulation for thromboembolic  prophylaxis. Consistent with this, the patient is chronically anticoagulated on Eliquis.  Does not appear to be on any AV nodal blocking agents at home.  Appears to be in sinus rhythm at this time, but will further evaluate with EKG in the setting of presenting shortness of breath.   Plan: monitor strict I's & O's and daily weights.  EKG x1 now.  Repeat BMP in the morning. Check serum magnesium level.  Hold home Eliquis this evening context of potential acute lower gastrointestinal, as above.  Closely monitor rate telemetry overnight given increased risk for atrial fibrillation with RVR in the context of presenting acute on chronic anemia.      #) Urinary incontinence: On oxybutynin as an outpatient.  In the setting of presenting acute on chronic multifactorial anemia, will hold home oxybutynin for now as the patient is already at increased risk for development of tachycardia in the setting of acute on chronic anemia before taking into consideration the anticholinergic implications of her home oxybutynin.  Plan: Hold home oxybutynin for now.  Monitor strict I's and O's and daily weights.  Repeat BMP in the morning.    DVT prophylaxis: SCDs Code Status: Full code Family Communication: Patient's case was discussed with her daughter, who is present at bedside. Disposition Plan: Per Rounding Team Consults called: none  Admission status: Observation; med telemetry.    PLEASE NOTE THAT DRAGON DICTATION SOFTWARE WAS USED IN THE CONSTRUCTION OF THIS NOTE.   [facility] Pager [phone] From 12PM- 12AM  Otherwise, please contact night-coverage  [email or web address] Password [facility]  09/08/2020, 4:36 PM

## 2020-09-08 NOTE — ED Provider Notes (Signed)
[facility] Emergency Department Provider Note  ____________________________________________   First MD Initiated Contact with Patient 09/08/20 1511     (approximate)  I have reviewed the triage vital signs and the nursing notes.   HISTORY  Chief Complaint Chest Pain and Sent by PCP    HPI Monica [name] is a 86 y.o. female  Here with chest pain, fatigue. Pt states that several days ago, she developed acute onset of aching, substernal chest pressure while in her bed. She tried to "calm down" and rested throughout the night. Her sx gradually resolved and have not returned. Since then, she has felt fatigued, lightheaded, and weak. She has been SOB w/ exertion. She went to Dr. Roland Rack today for f/u of hip replacement and was sent here after mentioning it. She reports chronic constipation, h/o GIB but has not noticed and BRB in stool or gross melena. No lightheadedness or dizziness currently, btu does feel lightheaded w/ standing.        Past Medical History:  Diagnosis Date  . Atrial fibrillation ([place])   . Bronchitis   . Cancer ([place])    Left leg growth, kidneys, lungs and breasts  . Carcinoma of unknown primary ([place])   . COPD (chronic obstructive pulmonary disease) ([place])   . Diabetes mellitus without complication ([place])   . Hyperlipidemia   . Hypertension   . Pancreatitis   . Pneumonia   . Stroke ([place])    TIA's  . Vitamin B12 deficiency     Patient Active Problem List   Diagnosis Date Noted  . Fracture of femoral neck, right ([place]) 04/19/2020  . Diabetes mellitus without complication ([place]) 06/28/4817  . Fall at home, initial encounter 04/19/2020  . Supratherapeutic INR 11/22/2019  . Scalp hematoma, subsequent encounter 11/17/2019  . Scalp laceration, subsequent encounter 10/26/2019  . Muscle cramping 10/26/2019  . Long term current use of anticoagulant therapy 10/26/2019  . Encounter for general adult medical examination with abnormal findings  08/21/2019  . Cellulitis of right buttock 07/25/2019  . Diabetic neuropathy ([place]) 06/25/2019  . Non-insulin dependent type 2 diabetes mellitus ([place]) 06/25/2019  . Pressure injury of skin 06/05/2019  . 'light-for-dates' infant with signs of fetal malnutrition 05/31/2019  . Closed left hip fracture ([place]) 05/31/2019  . Inflammatory polyarthritis ([place]) 05/09/2019  . Deep vein thrombosis (DVT) of non-extremity vein 11/29/2018  . Edema of left lower extremity 11/13/2018  . Cellulitis of left lower leg 11/01/2018  . Encounter for therapeutic drug level monitoring 07/05/2018  . Allergic contact dermatitis 04/19/2018  . Atopic dermatitis 03/15/2018  . 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 Atrial fibrillation, chronic ([place]) 02/12/2018  . Melena   . Urinary tract infection with hematuria 01/04/2018  . Chronic obstructive pulmonary disease ([place]) 12/27/2017  . Acute cystitis with hematuria 12/27/2017  . Dysuria 12/27/2017  . Urinary frequency 12/27/2017  . Uncontrolled type 2 diabetes mellitus with hyperglycemia ([place]) 12/27/2017  . Iron deficiency anemia due to chronic blood loss 09/28/2016  . Avitaminosis D 09/09/2016  . B12 deficiency 09/09/2016  . Temporary cerebral vascular dysfunction 09/09/2016  . Spinal stenosis 09/09/2016  . Pain in shoulder 09/09/2016  . Restless legs syndrome 09/09/2016  . Personal history of urinary calculi 09/09/2016  . H/O deep venous thrombosis 09/09/2016  . Gout 09/09/2016  . Accumulation of fluid in tissues 09/09/2016  . Carpal tunnel syndrome 09/09/2016  . Chronic lung disease 09/09/2016  . Cataract 09/09/2016  . Appendicular ataxia 09/09/2016  . Airway  hyperreactivity 09/09/2016  . Rectal bleeding   . Acute blood loss anemia   . Gastrointestinal hemorrhage 08/08/2016  . Carcinoma of unknown primary ([place]) 08/02/2016  . Diabetes ([place]) 05/08/2015  . Arthropathy of hand 02/04/2010  . 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 Detrusor instability of  bladder 06/18/2009  . Pure hypercholesterolemia 04/10/2009  . Essential hypertension 04/10/2009  . Diverticulitis of colon 04/10/2009    Past Surgical History:  Procedure Laterality Date  . ABDOMINAL HYSTERECTOMY  1975  . BLADDER SUSPENSION  1989  . CAROTID ANGIOGRAPHY Right 11/23/2019   Procedure: CAROTID ANGIOGRAPHY;  Surgeon: Katha Cabal, MD;  Location: [facility];  Service: Cardiovascular;  Laterality: Right;  . COLONOSCOPY    . COLONOSCOPY WITH PROPOFOL N/A 01/25/2018   Procedure: COLONOSCOPY WITH PROPOFOL;  Surgeon: [name], MD;  Location: [facility] ENDOSCOPY;  Service: Endoscopy;  Laterality: N/A;  . ESOPHAGOGASTRODUODENOSCOPY (EGD) WITH PROPOFOL N/A 01/24/2018   Procedure: ESOPHAGOGASTRODUODENOSCOPY (EGD) WITH PROPOFOL;  Surgeon: [name], MD;  Location: [facility] ENDOSCOPY;  Service: Endoscopy;  Laterality: N/A;  . HIP ARTHROPLASTY Left 06/03/2019   Procedure: ARTHROPLASTY BIPOLAR HIP (HEMIARTHROPLASTY);  Surgeon: Dereck Leep, MD;  Location: [facility] ORS;  Service: Orthopedics;  Laterality: Left;  . HIP ARTHROPLASTY Right 04/21/2020   Procedure: ARTHROPLASTY BIPOLAR HIP (HEMIARTHROPLASTY);  Surgeon: Corky Mull, MD;  Location: [facility] ORS;  Service: Orthopedics;  Laterality: Right;    Prior to Admission medications   Medication Sig Start Date End Date Taking? Authorizing Provider  albuterol (VENTOLIN HFA) 108 (90 Base) MCG/ACT inhaler Inhale 2 puffs into the lungs every 4 (four) hours as needed for wheezing or shortness of breath. 07/22/20   [name], NP  amLODipine-benazepril (LOTREL) 5-20 MG capsule Take 1 capsule by mouth daily. 03/19/20   [name], NP  b complex vitamins capsule Take 1 capsule by mouth daily.    [provider]  desloratadine (CLARINEX) 5 MG tablet Take 1 tablet (5 mg total) by mouth daily. 08/21/20   [name], NP  docusate sodium (COLACE) 100 MG capsule Take 1 capsule (100 mg total) by mouth 2 (two) times daily as  needed for mild constipation. 04/23/20   Enzo Bi, MD  ELIQUIS 2.5 MG TABS tablet TAKE ONE TABLET TWICE DAILY 06/17/20   Kendell Bane, NP  feeding supplement (BOOST HIGH PROTEIN) LIQD Take 1 Container by mouth as needed.    [provider]  feeding supplement, ENSURE ENLIVE, (ENSURE ENLIVE) LIQD Take 237 mLs by mouth 2 (two) times daily between meals. Patient not taking: Reported on 05/27/2020 04/23/20   Enzo Bi, MD  Ferrous Sulfate (IRON) 28 MG TABS Take 1 tablet (28 mg total) by mouth daily. Or any iron supplements over-the-counter. 04/23/20   Enzo Bi, MD  fluticasone furoate-vilanterol (BREO ELLIPTA) 100-25 MCG/INH AEPB TAKE 1 PUFF ONCE A DAY 02/14/20   Kendell Bane, NP  furosemide (LASIX) 20 MG tablet Take 1 tablet (20 mg total) by mouth daily as needed for fluid or edema. Patient not taking: Reported on 05/27/2020 11/25/19   Dhungel, Flonnie Overman, MD  gabapentin (NEURONTIN) 300 MG capsule Take 2 capsules (600 mg total) by mouth at bedtime. 03/19/20   [name], NP  glucose blood (ONETOUCH ULTRA) test strip 1 each by Other route daily. Use as instructed 07/26/19   [name], NP  hydrochlorothiazide (HYDRODIURIL) 25 MG tablet Take 1 tablet (25 mg total) by mouth daily. 02/01/20   [name], NP  Liniments (SALONPAS PAIN RELIEF PATCH EX) Apply topically. Patient not taking: Reported on 05/27/2020    [provider]  Magnesium 250 MG TABS Take 250 mg by mouth daily at 6 (six) AM. 10/26/19   [provider]  metFORMIN (GLUCOPHAGE-XR) 500 MG 24 hr tablet Take 2 tablets (1,000 mg total) by mouth daily. 04/18/20   [name], NP  methocarbamol (ROBAXIN) 500 MG tablet Take 1 tablet (500 mg total) by mouth every 8 (eight) hours as needed for muscle spasms. 04/23/20   Enzo Bi, MD  montelukast (SINGULAIR) 10 MG tablet Take 10 mg by mouth at bedtime.    [provider]  Multiple Vitamin (MULTIVITAMIN WITH MINERALS) TABS tablet Take 1 tablet by mouth  daily. Patient not taking: Reported on 05/27/2020 04/24/20   Enzo Bi, MD  nitrofurantoin, macrocrystal-monohydrate, (MACROBID) 100 MG capsule Take 1 capsule (100 mg total) by mouth 2 (two) times daily. 05/27/20   Kendell Bane, NP  nystatin cream (MYCOSTATIN) Apply topically 2 (two) times daily. 07/18/19   [name], NP  oxybutynin (DITROPAN-XL) 10 MG 24 hr tablet TAKE 1 TABLET BY MOUTH DAILY 07/22/20   Lavera Guise, MD  oxyCODONE (OXY IR/ROXICODONE) 5 MG immediate release tablet Take 1 tablet (5 mg total) by mouth every 6 (six) hours as needed for moderate pain. 04/23/20   Lattie Corns, PA-C  senna-docusate (SENOKOT-S) 8.6-50 MG tablet Take 1 tablet by mouth at bedtime as needed for mild constipation. 04/23/20   Enzo Bi, MD  sodium chloride 1 g tablet Take 1 tablet (1 g total) by mouth 3 (three) times daily with meals. 06/09/19   Dustin Flock, MD  theophylline (THEO-24) 100 MG 24 hr capsule TAKE 1 CAPSULE BY MOUTH EVERY DAY 06/17/20   Kendell Bane, NP  triamcinolone (KENALOG) 0.025 % cream Apply 1 application topically 2 (two) times daily. Ok to mix with topical lotion and apply to all affected areas. 08/29/20   [name], NP  famotidine (PEPCID) 20 MG tablet Take 1 tablet (20 mg total) by mouth 2 (two) times daily. 08/11/16 11/28/19  Epifanio Lesches, MD  pravastatin (PRAVACHOL) 40 MG tablet Take 1 tablet (40 mg total) by mouth daily. 08/15/19 11/28/19  [name], NP    Allergies Sulfa antibiotics, Celecoxib, Acetaminophen, Codeine, Lyrica [pregabalin], Penicillin g, and Petrolatum-zinc oxide  Family History  Problem Relation Age of Onset  . Cancer Father   . Diabetes Brother     Social History Social History   Tobacco Use  . Smoking status: Former Smoker    Packs/day: 0.50    Years: 15.00    Pack years: 7.50    Types: Cigarettes    Quit date: 11/15/1988    Years since quitting: 31.8  . Smokeless tobacco: Never Used  Vaping Use  . Vaping Use: Never  used  Substance Use Topics  . Alcohol use: No    Alcohol/week: 0.0 standard drinks  . Drug use: No    Review of Systems  Review of Systems  Constitutional: Positive for fatigue. Negative for fever.  HENT: Negative for congestion and sore throat.   Eyes: Negative for visual disturbance.  Respiratory: Positive for chest tightness and shortness of breath. Negative for cough.   Cardiovascular: Positive for chest pain.  Gastrointestinal: Positive for nausea. Negative for abdominal pain, diarrhea and vomiting.  Genitourinary: Negative for flank pain.  Musculoskeletal: Negative for back pain and neck pain.  Skin: Negative for rash and wound.  Neurological:  Positive for light-headedness. Negative for weakness.  All other systems reviewed and are negative.    ____________________________________________  PHYSICAL EXAM:      VITAL SIGNS: ED Triage Vitals  Enc Vitals Group     BP 09/08/20 1229 (!) 156/57     Pulse Rate 09/08/20 1229 69     Resp 09/08/20 1229 18     Temp 09/08/20 1229 98.3 F (36.8 C)     Temp Source 09/08/20 1229 Oral     SpO2 09/08/20 1229 99 %     Weight 09/08/20 1230 147 lb (66.7 kg)     Height --      Head Circumference --      Peak Flow --      Pain Score --      Pain Loc --      Pain Edu? --      Excl. in [place]? --      Physical Exam Vitals and nursing note reviewed.  Constitutional:      General: She is not in acute distress.    Appearance: She is well-developed.  HENT:     Head: Normocephalic and atraumatic.  Eyes:     Conjunctiva/sclera: Conjunctivae normal.  Cardiovascular:     Rate and Rhythm: Normal rate and regular rhythm.     Heart sounds: Normal heart sounds. No murmur heard.  No friction rub.  Pulmonary:     Effort: Pulmonary effort is normal. No respiratory distress.     Breath sounds: Normal breath sounds. No wheezing or rales.  Abdominal:     General: There is no distension.     Palpations: Abdomen is soft.     Tenderness: There  is no abdominal tenderness. There is no guarding or rebound.  Musculoskeletal:     Cervical back: Neck supple.  Skin:    General: Skin is warm.     Capillary Refill: Capillary refill takes less than 2 seconds.  Neurological:     Mental Status: She is alert and oriented to person, place, and time.     Motor: No abnormal muscle tone.       ____________________________________________   LABS (all labs ordered are listed, but only abnormal results are displayed)  Labs Reviewed  BASIC METABOLIC PANEL - Abnormal; Notable for the following components:      Result Value   Glucose, Bld 117 (*)    All other components within normal limits  CBC - Abnormal; Notable for the following components:   RBC 3.68 (*)    Hemoglobin 7.1 (*)    HCT 24.9 (*)    MCV 67.7 (*)    MCH 19.3 (*)    MCHC 28.5 (*)    RDW 17.5 (*)    Platelets 435 (*)    All other components within normal limits  RESPIRATORY PANEL BY RT PCR (FLU A&B, COVID)  OCCULT BLOOD X 1 CARD TO LAB, STOOL  PROTIME-INR  HEPATIC FUNCTION PANEL  PREPARE RBC (CROSSMATCH)  TROPONIN I (HIGH SENSITIVITY)  TROPONIN I (HIGH SENSITIVITY)    ____________________________________________  EKG: Sinus tahcycardia, VR 109. PR 208, QRS 76, QTc 498. No acute ST elevation or depressions. No ischemia or infarct. ________________________________________  RADIOLOGY All imaging, including plain films, CT scans, and ultrasounds, independently reviewed by me, and interpretations confirmed via formal radiology reads.  ED MD interpretation:   CXR: Clear, reviewed  Official radiology report(s): DG Chest 2 View  Result Date: 09/08/2020 CLINICAL DATA:  Chest pain and short of breath EXAM:  CHEST - 2 VIEW COMPARISON:  04/19/2020 FINDINGS: Improved aeration of the bases since the prior study. Residual linear density in the left base compatible with scar or atelectasis. Mild right lower lobe atelectasis/infiltrate. Lungs show mild hyperinflation. Heart  size within normal limits. Negative for heart failure. Atherosclerotic aortic arch. IMPRESSION: No acute abnormality. Probable chronic lung disease with mild scarring in the bases. Electronically Signed   By: Franchot Gallo M.D.   On: 09/08/2020 14:04    ____________________________________________  PROCEDURES   Procedure(s) performed (including Critical Care):  .Critical Care Performed by: Duffy Bruce, MD Authorized by: Duffy Bruce, MD   Critical care provider statement:    Critical care time (minutes):  35   Critical care time was exclusive of:  Separately billable procedures and treating other patients and teaching time   Critical care was necessary to treat or prevent imminent or life-threatening deterioration of the following conditions:  Cardiac failure and circulatory failure   Critical care was time spent personally by me on the following activities:  Development of treatment plan with patient or surrogate, discussions with consultants, evaluation of patient's response to treatment, examination of patient, obtaining history from patient or surrogate, ordering and performing treatments and interventions, ordering and review of laboratory studies, ordering and review of radiographic studies, pulse oximetry, re-evaluation of patient's condition and review of old charts   I assumed direction of critical care for this patient from another provider in my specialty: no      ____________________________________________  INITIAL IMPRESSION / MDM / [place] / ED COURSE  As part of my medical decision making, I reviewed the following data within the [facility] notes reviewed and incorporated, Old chart reviewed, Notes from prior ED visits, and [place] Controlled Substance Database       *MATHEW STORCK was evaluated in Emergency Department on 09/08/2020 for the symptoms described in the history of present illness. She was evaluated in the context of  the global COVID-19 pandemic, which necessitated consideration that the patient might be at risk for infection with the SARS-CoV-2 virus that causes COVID-19. Institutional protocols and algorithms that pertain to the evaluation of patients at risk for COVID-19 are in a state of rapid change based on information released by regulatory bodies including the CDC and federal and state organizations. These policies and algorithms were followed during the patient's care in the ED.  Some ED evaluations and interventions may be delayed as a result of limited staffing during the pandemic.*     Medical Decision Making:  86 yo F here with symptomatic microcytic anemia, likely causing anginal pain and DOE. H/o prior GIB on Eliquis. Suspect IDA related to chronic blood loss. No signs of active significant bleed. Hgb 7.1, Plt 435. BMP unremarkable. Trop neg, EKG is nonischemic - doubt ACS or primary cardiac etiology. Pt placed on monitor, reviewed, shows NSR/ST with occasional AFib. CXR reviewed by me and is unremarkable, with no PNA or PTX, no CHF. Will transfuse, admit to medicine.  ____________________________________________  FINAL CLINICAL IMPRESSION(S) / ED DIAGNOSES  Final diagnoses:  Microcytic anemia  DOE (dyspnea on exertion)     MEDICATIONS GIVEN DURING THIS VISIT:  Medications  0.9 %  sodium chloride infusion (has no administration in time range)     ED Discharge Orders    None       Note:  This document was prepared using Dragon voice recognition software and may include unintentional dictation errors.   Duffy Bruce,  MD 09/08/20 1538

## 2020-09-08 NOTE — ED Triage Notes (Signed)
Pt states she was sent by her PCP this am after eval of having episode of cp Thursday evening. States she was getting up to go to bathroom Thursday night, began to have "grabbing central cp, radiated to L arm". Pain has returned intermittently since when she is sob. VSS in triage, reports hx afib, CHF and COPD. Denies any cp, cough or sob presently

## 2020-09-08 NOTE — ED Notes (Signed)
Purewick placed on patient per patient request. Pt expresses no further needs at this time.

## 2020-09-08 NOTE — ED Notes (Signed)
Date and time results received: 09/08/20 7:48 PM  (use smartphrase ".now" to insert current time)  Test: VS Critical Value: 177/119  Name of Provider Notified: Dr. Velia Meyer  Orders Received? Or Actions Taken?: Orders Received - See Orders for details

## 2020-09-09 ENCOUNTER — Other Ambulatory Visit: Payer: Self-pay

## 2020-09-09 DIAGNOSIS — K5791 Diverticulosis of intestine, part unspecified, without perforation or abscess with bleeding: Secondary | ICD-10-CM | POA: Diagnosis present

## 2020-09-09 DIAGNOSIS — D75839 Thrombocytosis, unspecified: Secondary | ICD-10-CM | POA: Diagnosis not present

## 2020-09-09 DIAGNOSIS — Z8673 Personal history of transient ischemic attack (TIA), and cerebral infarction without residual deficits: Secondary | ICD-10-CM | POA: Diagnosis not present

## 2020-09-09 DIAGNOSIS — E538 Deficiency of other specified B group vitamins: Secondary | ICD-10-CM | POA: Diagnosis not present

## 2020-09-09 DIAGNOSIS — R0602 Shortness of breath: Secondary | ICD-10-CM | POA: Diagnosis present

## 2020-09-09 DIAGNOSIS — E7800 Pure hypercholesterolemia, unspecified: Secondary | ICD-10-CM | POA: Diagnosis present

## 2020-09-09 DIAGNOSIS — Z7984 Long term (current) use of oral hypoglycemic drugs: Secondary | ICD-10-CM | POA: Diagnosis not present

## 2020-09-09 DIAGNOSIS — Z86718 Personal history of other venous thrombosis and embolism: Secondary | ICD-10-CM | POA: Diagnosis not present

## 2020-09-09 DIAGNOSIS — Z20822 Contact with and (suspected) exposure to covid-19: Secondary | ICD-10-CM | POA: Diagnosis present

## 2020-09-09 DIAGNOSIS — Z7901 Long term (current) use of anticoagulants: Secondary | ICD-10-CM | POA: Diagnosis not present

## 2020-09-09 DIAGNOSIS — Z833 Family history of diabetes mellitus: Secondary | ICD-10-CM | POA: Diagnosis not present

## 2020-09-09 DIAGNOSIS — R0600 Dyspnea, unspecified: Secondary | ICD-10-CM

## 2020-09-09 DIAGNOSIS — D509 Iron deficiency anemia, unspecified: Secondary | ICD-10-CM | POA: Diagnosis present

## 2020-09-09 DIAGNOSIS — R079 Chest pain, unspecified: Secondary | ICD-10-CM | POA: Diagnosis not present

## 2020-09-09 DIAGNOSIS — Z79899 Other long term (current) drug therapy: Secondary | ICD-10-CM | POA: Diagnosis not present

## 2020-09-09 DIAGNOSIS — Z96643 Presence of artificial hip joint, bilateral: Secondary | ICD-10-CM | POA: Diagnosis present

## 2020-09-09 DIAGNOSIS — I480 Paroxysmal atrial fibrillation: Secondary | ICD-10-CM | POA: Diagnosis present

## 2020-09-09 DIAGNOSIS — R0609 Other forms of dyspnea: Secondary | ICD-10-CM

## 2020-09-09 DIAGNOSIS — E1142 Type 2 diabetes mellitus with diabetic polyneuropathy: Secondary | ICD-10-CM | POA: Diagnosis present

## 2020-09-09 DIAGNOSIS — D649 Anemia, unspecified: Secondary | ICD-10-CM | POA: Diagnosis not present

## 2020-09-09 DIAGNOSIS — I10 Essential (primary) hypertension: Secondary | ICD-10-CM | POA: Diagnosis present

## 2020-09-09 DIAGNOSIS — J449 Chronic obstructive pulmonary disease, unspecified: Secondary | ICD-10-CM | POA: Diagnosis present

## 2020-09-09 DIAGNOSIS — G2581 Restless legs syndrome: Secondary | ICD-10-CM | POA: Diagnosis present

## 2020-09-09 DIAGNOSIS — I482 Chronic atrial fibrillation, unspecified: Secondary | ICD-10-CM | POA: Diagnosis present

## 2020-09-09 DIAGNOSIS — E785 Hyperlipidemia, unspecified: Secondary | ICD-10-CM | POA: Diagnosis present

## 2020-09-09 DIAGNOSIS — M109 Gout, unspecified: Secondary | ICD-10-CM | POA: Diagnosis present

## 2020-09-09 DIAGNOSIS — Z809 Family history of malignant neoplasm, unspecified: Secondary | ICD-10-CM | POA: Diagnosis not present

## 2020-09-09 DIAGNOSIS — R32 Unspecified urinary incontinence: Secondary | ICD-10-CM | POA: Diagnosis present

## 2020-09-09 DIAGNOSIS — Z7951 Long term (current) use of inhaled steroids: Secondary | ICD-10-CM | POA: Diagnosis not present

## 2020-09-09 LAB — GLUCOSE, CAPILLARY
Glucose-Capillary: 122 mg/dL — ABNORMAL HIGH (ref 70–99)
Glucose-Capillary: 132 mg/dL — ABNORMAL HIGH (ref 70–99)
Glucose-Capillary: 187 mg/dL — ABNORMAL HIGH (ref 70–99)
Glucose-Capillary: 161 mg/dL — ABNORMAL HIGH (ref 70–99)

## 2020-09-09 LAB — COMPREHENSIVE METABOLIC PANEL
ALT: 13 U/L (ref 0–44)
AST: 17 U/L (ref 15–41)
Albumin: 3.7 g/dL (ref 3.5–5.0)
Alkaline Phosphatase: 66 U/L (ref 38–126)
Anion gap: 8 (ref 5–15)
BUN: 9 mg/dL (ref 8–23)
CO2: 24 mmol/L (ref 22–32)
Calcium: 9 mg/dL (ref 8.9–10.3)
Chloride: 104 mmol/L (ref 98–111)
Creatinine, Ser: 0.49 mg/dL (ref 0.44–1.00)
GFR, Estimated: >60 mL/min (ref >60)
Glucose, Bld: 129 mg/dL — ABNORMAL HIGH (ref 70–99)
Potassium: 3.5 mmol/L (ref 3.5–5.1)
Sodium: 136 mmol/L (ref 135–145)
Total Bilirubin: 1.9 mg/dL — ABNORMAL HIGH (ref 0.3–1.2)
Total Protein: 6.6 g/dL (ref 6.5–8.1)

## 2020-09-09 LAB — TYPE AND SCREEN
ABO/RH(D): B POS
Antibody Screen: NEGATIVE
Unit division: 0

## 2020-09-09 LAB — CBC
HCT: 28.3 % — ABNORMAL LOW (ref 36.0–46.0)
Hemoglobin: 8.2 g/dL — ABNORMAL LOW (ref 12.0–15.0)
MCH: 20.1 pg — ABNORMAL LOW (ref 26.0–34.0)
MCHC: 29 g/dL — ABNORMAL LOW (ref 30.0–36.0)
MCV: 69.4 fL — ABNORMAL LOW (ref 80.0–100.0)
Platelets: 420 10*3/uL — ABNORMAL HIGH (ref 150–400)
RBC: 4.08 MIL/uL (ref 3.87–5.11)
RDW: 18.3 % — ABNORMAL HIGH (ref 11.5–15.5)
WBC: 8.1 10*3/uL (ref 4.0–10.5)
nRBC: 0 % (ref 0.0–0.2)

## 2020-09-09 LAB — HEMOGLOBIN AND HEMATOCRIT, BLOOD
HCT: 29.1 % — ABNORMAL LOW (ref 36.0–46.0)
Hemoglobin: 8.6 g/dL — ABNORMAL LOW (ref 12.0–15.0)

## 2020-09-09 LAB — PATHOLOGIST SMEAR REVIEW

## 2020-09-09 LAB — BPAM RBC
Blood Product Expiration Date: 202111132359
ISSUE DATE / TIME: 202110252159
Unit Type and Rh: 7300

## 2020-09-09 LAB — MRSA PCR SCREENING: MRSA by PCR: NEGATIVE

## 2020-09-09 LAB — PROTIME-INR
INR: 1.2 (ref 0.8–1.2)
Prothrombin Time: 14.3 seconds (ref 11.4–15.2)

## 2020-09-09 LAB — MAGNESIUM: Magnesium: 1.7 mg/dL (ref 1.7–2.4)

## 2020-09-09 MED ORDER — HYDROCHLOROTHIAZIDE 25 MG PO TABS
25.0000 mg | ORAL_TABLET | Freq: Every day | ORAL | Status: DC
  Administered 2020-09-09 – 2020-09-10 (×2): 25 mg via ORAL
  Filled 2020-09-09 (×2): qty 1

## 2020-09-09 MED ORDER — SODIUM CHLORIDE 0.9 % IV SOLN
150.0000 mg | INTRAVENOUS | Status: DC
  Administered 2020-09-09 – 2020-09-10 (×2): 150 mg via INTRAVENOUS
  Filled 2020-09-09 (×2): qty 7.5

## 2020-09-09 MED ORDER — PANTOPRAZOLE SODIUM 40 MG PO TBEC
40.0000 mg | DELAYED_RELEASE_TABLET | Freq: Every day | ORAL | Status: DC
  Administered 2020-09-09 – 2020-09-10 (×2): 40 mg via ORAL
  Filled 2020-09-09 (×2): qty 1

## 2020-09-09 NOTE — Evaluation (Signed)
Physical Therapy Evaluation Patient Details Name: Monica [name] MRN: [id number] DOB: [birth year] Today's Date: 09/09/2020   History of Present Illness  Monica [name]  is a 86 y.o. female with medical history significant for chronic iron deficiency/B12 deficiency anemia associate with baseline hemoglobin of 8-10, diverticulosis, lower GI bleed, hypertension, type 2 diabetes mellitus, paroxysmal atrial fibrillation chronically anticoagulated on Eliquis, and urinary retention. MD assessment includes Acute on chronic symptomatic anemia and Essential hypertension.  Clinical Impression  Pt was pleasant and motivated to participate during the session. Pt was alert and oriented by very impulsive. At multiple times pt stood up despite PT advising against mobility without PT. Pt was able to perform bed mobility with Mod I with HOB elevated and increased time. Pt was able to perform STS with ease but required min guard for balance. Pt able to perform STS multiple times with moderately increased effort.  At baseline, pt uses rollator but declined to use RW, stating that " she just didn't like them". Pt able to ambulate 51' with heavy use of HHA and furniture cruising with other hand. Pt significantly SOB following ambulation. SpO2 was 96 and HR was 105. After a 3 minute rest, pt was able to ambulate another 15'. Pt self stopped walking to bend over to reach an object and put on a jacket while standing with Min guard. Pt will benefit from HHPT services upon discharge to safely address deficits listed in patient problem list for decreased caregiver assistance and eventual return to PLOF.      Follow Up Recommendations Home health PT;Supervision - Intermittent    Equipment Recommendations  None recommended by PT    Recommendations for Other Services       Precautions / Restrictions Precautions Precautions: Fall Restrictions Weight Bearing Restrictions: No      Mobility  Bed Mobility Overal bed  mobility: Modified Independent             General bed mobility comments: increased time and elevated HOB Patient Response: Impulsive  Transfers Overall transfer level: Needs assistance Equipment used: 1 person hand held assist Transfers: Sit to/from Stand Sit to Stand: Min guard         General transfer comment: pt able to come to standing easily but is impulsive and stands without PT direction  Ambulation/Gait Ambulation/Gait assistance: Min guard Gait Distance (Feet): 15 Feet x2 Assistive device: 1 person hand held assist Gait Pattern/deviations: Shuffle;Decreased stride length Gait velocity: decreased   General Gait Details: At baseline pt uses rollator. Pt refuses to use RW and states that she does not like them. Pt uses handheld assist and furniture cruises with the other hand. pt reports that she does this to move around in her apartment at baseline. Pt significantly SOB following short ambulation  Stairs            Wheelchair Mobility    Modified Rankin (Stroke Patients Only)       Balance Overall balance assessment: Needs assistance Sitting-balance support: Feet unsupported;Bilateral upper extremity supported Sitting balance-Leahy Scale: Good Sitting balance - Comments: pt able to maintain static sitting can move outside BOS   Standing balance support: Single extremity supported Standing balance-Leahy Scale: Fair Standing balance comment: pt able to maintain standing balance but requires UE support for dynamic activity                             Pertinent Vitals/Pain Pain Assessment: Faces Faces Pain Scale:  No hurt Pain Intervention(s): Monitored during session    Home Living Family/patient expects to be discharged to:: Assisted living               Home Equipment: Walker - 4 wheels;Grab bars - toilet;Grab bars - tub/shower;Shower seat;Wheelchair - Education officer, community - power (reports using rollator as primary AD) Additional  Comments: Resident of [facility] x10 years    Prior Function Level of Independence: Independent with assistive device(s)         Comments: pt recieves help with groceries and cleaning but reports doing most of her ADLs by herself. pt reports walking the halls at the apartment for exercise     Hand Dominance   Dominant Hand: Right    Extremity/Trunk Assessment                Communication   Communication: No difficulties  Cognition Arousal/Alertness: Awake/alert Behavior During Therapy: WFL for tasks assessed/performed Overall Cognitive Status: Within Functional Limits for tasks assessed                                        General Comments      Exercises Other Exercises Other Exercises: pt educated on POC and importance of physical therapy   Assessment/Plan    PT Assessment Patient needs continued PT services  PT Problem List Decreased safety awareness;Decreased mobility;Decreased activity tolerance;Decreased balance       PT Treatment Interventions DME instruction;Therapeutic exercise;Gait training;Balance training;Stair training;Functional mobility training;Therapeutic activities;Patient/family education    PT Goals (Current goals can be found in the Care Plan section)  Acute Rehab PT Goals Patient Stated Goal: to go back home PT Goal Formulation: With patient Time For Goal Achievement: 09/22/20 Potential to Achieve Goals: Good    Frequency Min 2X/week   Barriers to discharge        Co-evaluation               AM-PAC PT "6 Clicks" Mobility  Outcome Measure Help needed turning from your back to your side while in a flat bed without using bedrails?: None Help needed moving from lying on your back to sitting on the side of a flat bed without using bedrails?: None Help needed moving to and from a bed to a chair (including a wheelchair)?: A Little Help needed standing up from a chair using your arms (e.g.,  wheelchair or bedside chair)?: A Little Help needed to walk in hospital room?: A Little Help needed climbing 3-5 steps with a railing? : A Lot 6 Click Score: 19    End of Session Equipment Utilized During Treatment: Gait belt Activity Tolerance: Patient tolerated treatment well Patient left: in chair;with call bell/phone within reach;with chair alarm set Nurse Communication: Mobility status (pt needed purwick and chuck pads) PT Visit Diagnosis: Unsteadiness on feet (R26.81);History of falling (Z91.81);Difficulty in walking, not elsewhere classified (R26.2)    Time: 4627-0350 PT Time Calculation (min) (ACUTE ONLY): 36 min   Charges:            Hervey Ard, SPT 09/09/20. 2:37 PM

## 2020-09-09 NOTE — Plan of Care (Signed)

## 2020-09-09 NOTE — Progress Notes (Addendum)
PROGRESS NOTE    Monica [name]  XBD:[id number] DOB: 05-10-1926 DOA: 09/08/2020 PCP: [name], NP    Assessment & Plan:   Principal Problem:   Acute on chronic anemia Active Problems:   Diabetes ([place])   B12 deficiency   Essential hypertension   Chronic obstructive pulmonary disease (HCC)   SOB (shortness of breath)   Acute on chronic symptomatic anemia: w/ hx of IDA. Will start IV iron. S/p 1 unit of pRBCs transfused. Takes NSAIDs daily approx since 04/2020 as per pt, so there is a concern for possible ulcer. Started on PPI. Fecal occult ordered. Will continue to hold home dose of eliquis. Will continue to monitor H&H. B12 level is pending. Consider GI consult if H&H trend down/signs/symptoms of bleeding    Thrombocytosis: etiology unclear. Will continue to monitor   HTN: uncontrolled. Will continue on home dose of amlodipine, benazepril & HCTZ  DM2: continue to hold home dose of metformin. Continue on SSI w/ accuchecks.   Peripheral neuropathy: continue on home dose of gabapentin   COPD: w/o exacerbation. Continue on bronchodilators and encourage incentive spirometry   PAF: continue to hold home dose of eliquis secondary acute on chronic anemia. Not on rhythm controlling meds as per med rec   Urinary incontinence: will continue to hold home dose of oxybutynin   DVT prophylaxis: SCDs Code Status: full  Family Communication:  Disposition Plan: depends on PT/OT recs  Status is: Observation  The patient remains OBS appropriate and will d/c before 2 midnights. If H&H are stable tomorrow & no signs/symptoms of bleeding can d/c. PT/OT still need to evaluate the pt   Dispo: The patient is from: Home              Anticipated d/c is to: Home vs HH vs SNF              Anticipated d/c date is: 2 days              Patient currently is not medically stable to d/c.     Consultants:    Procedures:    Antimicrobials:   Subjective: Pt c/o not having enough water  drink or tv remote.   Objective: Vitals:   09/08/20 2203 09/08/20 2219 09/09/20 0022 09/09/20 0048  BP: (!) 164/89 (!) 182/67 (!) 160/63 (!) 161/79  Pulse: (!) 106 (!) 59 61 (!) 104  Resp:   20   Temp: 98.4 F (36.9 C) 98.3 F (36.8 C) 98 F (36.7 C) 97.9 F (36.6 C)  TempSrc: Oral Oral  Oral  SpO2: 98% 98% 98% 97%  Weight:        Intake/Output Summary (Last 24 hours) at 09/09/2020 0738 Last data filed at 09/09/2020 0550 Gross per 24 hour  Intake 340 ml  Output 300 ml  Net 40 ml   Filed Weights   09/08/20 1230  Weight: 66.7 kg    Examination:  General exam: Appears agitated and frustrated  Respiratory system: Clear to auscultation. Respiratory effort normal. Cardiovascular system: S1 & S2 +. No rubs, gallops or clicks. Gastrointestinal system: Abdomen is nondistended, soft and nontender.  Normal bowel sounds heard. Central nervous system: Alert and oriented. Moves all 4 extremities  Psychiatry: Judgement and insight appear normal. Agitated and frustrated     Data Reviewed: I have personally reviewed following labs and imaging studies  CBC: Recent Labs  Lab 09/08/20 1232 09/08/20 2115 09/09/20 0154  WBC 7.5  --  8.1  HGB 7.1* 7.5*  8.2*  HCT 24.9* 26.6* 28.3*  MCV 67.7*  --  69.4*  PLT 435*  --  235*   Basic Metabolic Panel: Recent Labs  Lab 09/08/20 1232 09/09/20 0154  NA 135 136  K 3.8 3.5  CL 101 104  CO2 23 24  GLUCOSE 117* 129*  BUN 12 9  CREATININE 0.64 0.49  CALCIUM 9.6 9.0  MG  --  1.7   GFR: Estimated Creatinine Clearance: 41.2 mL/min (by C-G formula based on SCr of 0.49 mg/dL). Liver Function Tests: Recent Labs  Lab 09/08/20 1538 09/09/20 0154  AST 23 17  ALT 15 13  ALKPHOS 71 66  BILITOT 0.7 1.9*  PROT 7.8 6.6  ALBUMIN 4.2 3.7   No results for input(s): LIPASE, AMYLASE in the last 168 hours. No results for input(s): AMMONIA in the last 168 hours. Coagulation Profile: Recent Labs  Lab 09/08/20 1538 09/09/20 0154  INR  1.1 1.2   Cardiac Enzymes: No results for input(s): CKTOTAL, CKMB, CKMBINDEX, TROPONINI in the last 168 hours. BNP (last 3 results) No results for input(s): PROBNP in the last 8760 hours. HbA1C: No results for input(s): HGBA1C in the last 72 hours. CBG: Recent Labs  Lab 09/08/20 1804 09/08/20 2219 09/09/20 0153  GLUCAP 96 138* 122*   Lipid Profile: No results for input(s): CHOL, HDL, LDLCALC, TRIG, CHOLHDL, LDLDIRECT in the last 72 hours. Thyroid Function Tests: No results for input(s): TSH, T4TOTAL, FREET4, T3FREE, THYROIDAB in the last 72 hours. Anemia Panel: Recent Labs    09/08/20 1232 09/08/20 1432  FOLATE  --  19.8  FERRITIN  --  5*  TIBC  --  575*  IRON  --  12*  RETICCTPCT 1.8  --    Sepsis Labs: No results for input(s): PROCALCITON, LATICACIDVEN in the last 168 hours.  Recent Results (from the past 240 hour(s))  Respiratory Panel by RT PCR (Flu A&B, Covid) - Nasopharyngeal Swab     Status: None   Collection Time: 09/08/20  3:36 PM   Specimen: Nasopharyngeal Swab  Result Value Ref Range Status   SARS Coronavirus 2 by RT PCR NEGATIVE NEGATIVE Final    Comment: (NOTE) SARS-CoV-2 target nucleic acids are NOT DETECTED.  The SARS-CoV-2 RNA is generally detectable in upper respiratoy specimens during the acute phase of infection. The lowest concentration of SARS-CoV-2 viral copies this assay can detect is 131 copies/mL. A negative result does not preclude SARS-Cov-2 infection and should not be used as the sole basis for treatment or other patient management decisions. A negative result may occur with  improper specimen collection/handling, submission of specimen other than nasopharyngeal swab, presence of viral mutation(s) within the areas targeted by this assay, and inadequate number of viral copies (<131 copies/mL). A negative result must be combined with clinical observations, patient history, and epidemiological information. The expected result is  Negative.  Fact Sheet for Patients:  PinkCheek.be  Fact Sheet for Healthcare Providers:  GravelBags.it  This test is no t yet approved or cleared by the [place] FDA and  has been authorized for detection and/or diagnosis of SARS-CoV-2 by FDA under an Emergency Use Authorization (EUA). This EUA will remain  in effect (meaning this test can be used) for the duration of the COVID-19 declaration under Section 564(b)(1) of the Act, 21 U.S.C. section 360bbb-3(b)(1), unless the authorization is terminated or revoked sooner.     Influenza A by PCR NEGATIVE NEGATIVE Final   Influenza B by PCR NEGATIVE NEGATIVE Final  Comment: (NOTE) The Xpert Xpress SARS-CoV-2/FLU/RSV assay is intended as an aid in  the diagnosis of influenza from Nasopharyngeal swab specimens and  should not be used as a sole basis for treatment. Nasal washings and  aspirates are unacceptable for Xpert Xpress SARS-CoV-2/FLU/RSV  testing.  Fact Sheet for Patients: PinkCheek.be  Fact Sheet for Healthcare Providers: GravelBags.it  This test is not yet approved or cleared by the [place] FDA and  has been authorized for detection and/or diagnosis of SARS-CoV-2 by  FDA under an Emergency Use Authorization (EUA). This EUA will remain  in effect (meaning this test can be used) for the duration of the  Covid-19 declaration under Section 564(b)(1) of the Act, 21  U.S.C. section 360bbb-3(b)(1), unless the authorization is  terminated or revoked. Performed at [facility], [address] [place], [place] [address]   MRSA PCR Screening     Status: None   Collection Time: 09/08/20 11:24 PM   Specimen: Nasopharyngeal  Result Value Ref Range Status   MRSA by PCR NEGATIVE NEGATIVE Final    Comment:        The GeneXpert MRSA Assay (FDA approved for NASAL specimens only), is one component  of a comprehensive MRSA colonization surveillance program. It is not intended to diagnose MRSA infection nor to guide or monitor treatment for MRSA infections. Performed at [facility], [address], [place], [place] [address]          Radiology Studies: DG Chest 2 View  Result Date: 09/08/2020 CLINICAL DATA:  Chest pain and short of breath EXAM: CHEST - 2 VIEW COMPARISON:  04/19/2020 FINDINGS: Improved aeration of the bases since the prior study. Residual linear density in the left base compatible with scar or atelectasis. Mild right lower lobe atelectasis/infiltrate. Lungs show mild hyperinflation. Heart size within normal limits. Negative for heart failure. Atherosclerotic aortic arch. IMPRESSION: No acute abnormality. Probable chronic lung disease with mild scarring in the bases. Electronically Signed   By: Franchot Gallo M.D.   On: 09/08/2020 14:04        Scheduled Meds: . amLODipine  5 mg Oral Daily  . benazepril  20 mg Oral Daily  . fluticasone furoate-vilanterol  1 puff Inhalation Daily  . gabapentin  600 mg Oral QHS  . insulin aspart  0-6 Units Subcutaneous Q6H WA  . theophylline  100 mg Oral Daily   Continuous Infusions: . sodium chloride       LOS: 0 days    Time spent: 38 mins     Wyvonnia Dusky, MD [facility] Pager [phone]  If 7PM-7AM, please contact night-coverage [email or web address] 09/09/2020, 7:38 AM

## 2020-09-10 ENCOUNTER — Other Ambulatory Visit: Payer: Self-pay | Admitting: Family Medicine

## 2020-09-10 DIAGNOSIS — D649 Anemia, unspecified: Secondary | ICD-10-CM | POA: Diagnosis not present

## 2020-09-10 DIAGNOSIS — E538 Deficiency of other specified B group vitamins: Secondary | ICD-10-CM | POA: Diagnosis not present

## 2020-09-10 DIAGNOSIS — D509 Iron deficiency anemia, unspecified: Secondary | ICD-10-CM | POA: Diagnosis not present

## 2020-09-10 DIAGNOSIS — R0600 Dyspnea, unspecified: Secondary | ICD-10-CM

## 2020-09-10 LAB — CBC
HCT: 29 % — ABNORMAL LOW (ref 36.0–46.0)
Hemoglobin: 8.6 g/dL — ABNORMAL LOW (ref 12.0–15.0)
MCH: 21.1 pg — ABNORMAL LOW (ref 26.0–34.0)
MCHC: 29.7 g/dL — ABNORMAL LOW (ref 30.0–36.0)
MCV: 71.3 fL — ABNORMAL LOW (ref 80.0–100.0)
Platelets: 372 10*3/uL (ref 150–400)
RBC: 4.07 MIL/uL (ref 3.87–5.11)
RDW: 19.2 % — ABNORMAL HIGH (ref 11.5–15.5)
WBC: 7.6 10*3/uL (ref 4.0–10.5)
nRBC: 0 % (ref 0.0–0.2)

## 2020-09-10 LAB — BASIC METABOLIC PANEL
Anion gap: 8 (ref 5–15)
BUN: 8 mg/dL (ref 8–23)
CO2: 24 mmol/L (ref 22–32)
Calcium: 9.5 mg/dL (ref 8.9–10.3)
Chloride: 104 mmol/L (ref 98–111)
Creatinine, Ser: 0.74 mg/dL (ref 0.44–1.00)
GFR, Estimated: >60 mL/min (ref >60)
Glucose, Bld: 143 mg/dL — ABNORMAL HIGH (ref 70–99)
Potassium: 3.9 mmol/L (ref 3.5–5.1)
Sodium: 136 mmol/L (ref 135–145)

## 2020-09-10 LAB — GLUCOSE, CAPILLARY: Glucose-Capillary: 129 mg/dL — ABNORMAL HIGH (ref 70–99)

## 2020-09-10 MED ORDER — IRON 28 MG PO TABS: 1.0000 | ORAL_TABLET | Freq: Every day | ORAL | 0 refills | Status: DC

## 2020-09-10 NOTE — TOC Initial Note (Signed)
Transition of Care [facility]) - Initial/Assessment Note    Patient Details  Name: Monica [name] MRN: [id number] Date of Birth: [birth year]  [facility]) CM/SW Contact:    Shelbie Ammons, RN Phone Number: 09/10/2020, 10:39 AM  Clinical Narrative:   RNCM met with patient in room. Patient sitting up in recliner on arrival, reports to feeling well today and is hopeful she will get to go home. Discussed with patient discharge recommendations for home health. Patient reports that she has just completed physical therapy with the therapy dept where she lives and she would like to get back in touch with them before she makes a decision. Patient reports she does not need any equipment and does not know of any other needs.       RNCM reached out to facility and left message for therapist for return call.           Expected Discharge Plan: [place] Barriers to Discharge: No Barriers Identified   Patient Goals and CMS Choice     Choice offered to / list presented to : Patient  Expected Discharge Plan and Services Expected Discharge Plan: [place]       Living arrangements for the past 2 months: [place] Expected Discharge Date: 09/10/20                                    Prior Living Arrangements/Services Living arrangements for the past 2 months: [place] Lives with:: Facility Resident Patient language and need for interpreter reviewed:: Yes        Need for Family Participation in Patient Care: Yes (Comment) Care giver support system in place?: Yes (comment)   Criminal Activity/Legal Involvement Pertinent to Current Situation/Hospitalization: No - Comment as needed  Activities of Daily Living Home Assistive Devices/Equipment: Eyeglasses, Transport planner, Environmental consultant (specify type), Other (Comment) ADL Screening (condition at time of admission) Patient's cognitive ability adequate to safely complete  daily activities?: Yes Is the patient deaf or have difficulty hearing?: Yes Does the patient have difficulty seeing, even when wearing glasses/contacts?: Yes Does the patient have difficulty concentrating, remembering, or making decisions?: No Patient able to express need for assistance with ADLs?: Yes Does the patient have difficulty dressing or bathing?: Yes Independently performs ADLs?: No Communication: Independent Dressing (OT): Appropriate for developmental age Grooming: Appropriate for developmental age Feeding: Appropriate for developmental age Bathing: Appropriate for developmental age 49: Appropriate for developmental age In/Out Bed: Needs assistance Is this a change from baseline?: Pre-admission baseline Walks in Home: Needs assistance Is this a change from baseline?: Pre-admission baseline Does the patient have difficulty walking or climbing stairs?: Yes Weakness of Legs: Both Weakness of Arms/Hands: None  Permission Sought/Granted                  Emotional Assessment Appearance:: Appears stated age Attitude/Demeanor/Rapport: Engaged Affect (typically observed): Appropriate, Calm Orientation: : Oriented to Self, Oriented to Place, Oriented to  Time, Oriented to Situation Alcohol / Substance Use: Not Applicable Psych Involvement: No (comment)  Admission diagnosis:  Microcytic anemia [D50.9] DOE (dyspnea on exertion) [R06.00] Acute on chronic anemia [D64.9] Patient Active Problem List   Diagnosis Date Noted  . Microcytic anemia   . DOE (dyspnea on exertion) 09/09/2020  . Acute on chronic anemia 09/08/2020  . Fracture of femoral neck, right ([place]) 04/19/2020  . Diabetes mellitus  without complication ([place]) 83/38/2505  . Fall at home, initial encounter 04/19/2020  . Supratherapeutic INR 11/22/2019  . Scalp hematoma, subsequent encounter 11/17/2019  . Scalp laceration, subsequent encounter 10/26/2019  . Muscle cramping 10/26/2019  . Long term current  use of anticoagulant therapy 10/26/2019  . Encounter for general adult medical examination with abnormal findings 08/21/2019  . Cellulitis of right buttock 07/25/2019  . Diabetic neuropathy ([place]) 06/25/2019  . Non-insulin dependent type 2 diabetes mellitus ([place]) 06/25/2019  . Pressure injury of skin 06/05/2019  . 'light-for-dates' infant with signs of fetal malnutrition 05/31/2019  . Closed left hip fracture ([place]) 05/31/2019  . Inflammatory polyarthritis ([place]) 05/09/2019  . Deep vein thrombosis (DVT) of non-extremity vein 11/29/2018  . Edema of left lower extremity 11/13/2018  . Cellulitis of left lower leg 11/01/2018  . Encounter for therapeutic drug level monitoring 07/05/2018  . Allergic contact dermatitis 04/19/2018  . Atopic dermatitis 03/15/2018  . 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 Atrial fibrillation, chronic ([place]) 02/12/2018  . Melena   . Urinary tract infection with hematuria 01/04/2018  . Chronic obstructive pulmonary disease ([place]) 12/27/2017  . Acute cystitis with hematuria 12/27/2017  . Dysuria 12/27/2017  . Urinary frequency 12/27/2017  . Uncontrolled type 2 diabetes mellitus with hyperglycemia ([place]) 12/27/2017  . Iron deficiency anemia due to chronic blood loss 09/28/2016  . Avitaminosis D 09/09/2016  . B12 deficiency 09/09/2016  . Temporary cerebral vascular dysfunction 09/09/2016  . Spinal stenosis 09/09/2016  . Pain in shoulder 09/09/2016  . Restless legs syndrome 09/09/2016  . Personal history of urinary calculi 09/09/2016  . H/O deep venous thrombosis 09/09/2016  . Gout 09/09/2016  . Accumulation of fluid in tissues 09/09/2016  . Carpal tunnel syndrome 09/09/2016  . Chronic lung disease 09/09/2016  . Cataract 09/09/2016  . Appendicular ataxia 09/09/2016  . Airway hyperreactivity 09/09/2016  . Rectal bleeding   . Acute blood loss anemia   . Gastrointestinal hemorrhage 08/08/2016  . Carcinoma of unknown primary ([place]) 08/02/2016  . Diabetes ([place])  05/08/2015  . Arthropathy of hand 02/04/2010  . 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 Detrusor instability of bladder 06/18/2009  . Pure hypercholesterolemia 04/10/2009  . Essential hypertension 04/10/2009  . Diverticulitis of colon 04/10/2009   PCP:  [name], NP Pharmacy:   [place], [place] - [address] [place] [address] Phone: [phone] Fax: [phone]     Social Determinants of Health (SDOH) Interventions    Readmission Risk Interventions Readmission Risk Prevention Plan 06/08/2019 06/08/2019  Transportation Screening Complete Complete  PCP or Specialist Appt within 3-5 Days Complete Complete  HRI or [place] - Complete  Social Work Consult for [place] Planning/Counseling Complete Complete  Palliative Care Screening Not Applicable Not Applicable  Medication Review Press photographer) Complete Complete  Some recent data might be hidden

## 2020-09-10 NOTE — Progress Notes (Signed)
PT Cancellation Note  Patient Details Name: Monica [name] MRN: [id number] DOB: 12-15-1925   Cancelled Treatment:    Reason Eval/Treat Not Completed: Patient declined, no reason specified Pt declined PT stating that she was discharging today and did not want therapy before she went home. Pt educated patient on importance of keeping active at home. Pt's IV was occluded and nursing notified.   Hervey Ard 09/10/2020, 11:21 AM

## 2020-09-10 NOTE — Evaluation (Signed)
Occupational Therapy Evaluation Patient Details Name: Monica [name] MRN: [id number] DOB: 08-Dec-1925 Today's Date: 09/10/2020    History of Present Illness Monica [name]  is a 86 y.o. female with medical history significant for chronic iron deficiency/B12 deficiency anemia associate with baseline hemoglobin of 8-10, diverticulosis, lower GI bleed, hypertension, type 2 diabetes mellitus, paroxysmal atrial fibrillation chronically anticoagulated on Eliquis, and urinary retention. MD assessment includes Acute on chronic symptomatic anemia and Essential hypertension.   Clinical Impression   Monica [name] was seen for OT evaluation this date. Prior to hospital admission, pt was modified independent in all aspects of ADL management. She reports using a 4WW for all functional mobility, and endorses 1 fall in past year. Pt lives within an ALF with accessible entrance, bathroom, and assistance with grocery shopping, cleaning, etc. As needed from ALF staff. Currently pt reporting her presenting symptoms have resolved. Pt demonstrates baseline independence to perform ADL and mobility tasks and no strength, sensory, coordination, cognitive, or visual deficits appreciated with assessment. No skilled OT needs identified. Will sign off at this time. Please re-consult if additional OT needs arise during this admission.     Follow Up Recommendations  No OT follow up    Equipment Recommendations  None recommended by OT    Recommendations for Other Services       Precautions / Restrictions Precautions Precautions: Fall Restrictions Weight Bearing Restrictions: No      Mobility Bed Mobility Overal bed mobility: Modified Independent             General bed mobility comments: HOB elevated.    Transfers Overall transfer level: Needs assistance Equipment used: 1 person hand held assist Transfers: Sit to/from Stand Sit to Stand: Min guard              Balance Overall balance assessment: Needs  assistance Sitting-balance support: Bilateral upper extremity supported;Feet supported Sitting balance-Leahy Scale: Good Sitting balance - Comments: Static sitting, reaching within BOS.   Standing balance support: Single extremity supported;During functional activity Standing balance-Leahy Scale: Fair Standing balance comment: Generally maintains at least 1 UE support during standing.                           ADL either performed or assessed with clinical judgement   ADL Overall ADL's : At baseline                                       General ADL Comments: Pt presents at or near baseline level of functional indep for ADL management. She is able to adjust B socks at bed level with godo LB access appreciated this date. She performs bed mobility MOD I, and Functional transfer to recliner with 1 hand held assist. Anticipate mod I for functional mobility however pt declines 2WW states she prefers her rollator.     Vision Baseline Vision/History: Wears glasses Wears Glasses: At all times Patient Visual Report: No change from baseline       Perception     Praxis      Pertinent Vitals/Pain Pain Assessment: No/denies pain     Hand Dominance Right   Extremity/Trunk Assessment Upper Extremity Assessment Upper Extremity Assessment: Overall WFL for tasks assessed   Lower Extremity Assessment Lower Extremity Assessment: Overall WFL for tasks assessed   Cervical / Trunk Assessment Cervical / Trunk Assessment: Normal   Communication  Communication Communication: No difficulties   Cognition Arousal/Alertness: Awake/alert Behavior During Therapy: WFL for tasks assessed/performed Overall Cognitive Status: Within Functional Limits for tasks assessed                                     General Comments       Exercises Other Exercises Other Exercises: OT faciliatates bed/functional mobility, assistance with external catheter, and set-up  of meal tray. Falls prevention education provided t/o session.   Shoulder Instructions      Home Living Family/patient expects to be discharged to:: Assisted living                             Home Equipment: Walker - 4 wheels;Grab bars - toilet;Grab bars - tub/shower;Shower seat;Wheelchair - Education officer, community - power   Additional Comments: Resident of [facility] x10 years      Prior Functioning/Environment Level of Independence: Independent with assistive device(s)        Comments: pt recieves help with groceries and cleaning but reports dressing and bathing independently. She uses a 4WW for functional mobility at baseline and states she enjoys walking the halls of her apt for exercise.        OT Problem List: Decreased strength;Decreased activity tolerance;Decreased safety awareness;Decreased knowledge of use of DME or AE      OT Treatment/Interventions:      OT Goals(Current goals can be found in the care plan section) Acute Rehab OT Goals Patient Stated Goal: To go back to [facility] OT Goal Formulation: With patient Time For Goal Achievement: 09/24/20 Potential to Achieve Goals: Fair  OT Frequency:     Barriers to D/C:            Co-evaluation              AM-PAC OT "6 Clicks" Daily Activity     Outcome Measure Help from another person eating meals?: None Help from another person taking care of personal grooming?: None Help from another person toileting, which includes using toliet, bedpan, or urinal?: A Little Help from another person bathing (including washing, rinsing, drying)?: A Little Help from another person to put on and taking off regular upper body clothing?: None Help from another person to put on and taking off regular lower body clothing?: None 6 Click Score: 22   End of Session Equipment Utilized During Treatment: Gait belt  Activity Tolerance: Patient tolerated treatment well Patient left: with call bell/phone  within reach;in chair;with chair alarm set  OT Visit Diagnosis: Unsteadiness on feet (R26.81)                Time: 5366-4403 OT Time Calculation (min): 16 min Charges:  OT General Charges $OT Visit: 1 Visit OT Evaluation $OT Eval Low Complexity: 1 Low  Shara Blazing, M.S., OTR/L Ascom: [phone] 09/10/20, 9:17 AM

## 2020-09-11 ENCOUNTER — Other Ambulatory Visit: Payer: Self-pay

## 2020-09-11 MED ORDER — ONETOUCH DELICA LANCETS 30G MISC: 0 refills | Status: DC

## 2020-09-11 NOTE — Discharge Summary (Signed)
[place] [facility] [place] NAME: Monica [name]    MR#:  [id number]  DATE OF BIRTH:  1926/08/17  DATE OF ADMISSION:  09/08/2020   ADMITTING PHYSICIAN: Wyvonnia Dusky, MD  DATE OF DISCHARGE: 09/10/2020 12:37 PM  PRIMARY CARE PHYSICIAN: [name], NP   ADMISSION DIAGNOSIS:  Microcytic anemia [D50.9] DOE (dyspnea on exertion) [R06.00] Acute on chronic anemia [D64.9] DISCHARGE DIAGNOSIS:  Principal Problem:   Acute on chronic anemia Active Problems:   Diabetes ([place])   B12 deficiency   Essential hypertension   Chronic obstructive pulmonary disease (HCC)   DOE (dyspnea on exertion)   Microcytic anemia  SECONDARY DIAGNOSIS:   Past Medical History:  Diagnosis Date  . Atrial fibrillation ([place])   . Bronchitis   . Cancer ([place])    Left leg growth, kidneys, lungs and breasts  . Carcinoma of unknown primary ([place])   . COPD (chronic obstructive pulmonary disease) ([place])   . Diabetes mellitus without complication ([place])   . Hyperlipidemia   . Hypertension   . Pancreatitis   . Pneumonia   . Stroke ([place])    TIA's  . Vitamin B12 deficiency    HOSPITAL COURSE:  86 y.o. female with medical history significant for chronic iron deficiency/B12 deficiency anemia associate with baseline hemoglobin of 8-10, diverticulosis, lower GI bleed, hypertension, type 2 diabetes mellitus, paroxysmal atrial fibrillation chronically anticoagulated on Eliquis, urinary retention admitted to [facility] on 09/08/2020 with acute on chronic symptomatic anemia after presenting from home to [facility] complaining of shortness of breath.   #) Acute on chronic symptomatic anemia: In the context of chronic anemia in the setting of chronic iron deficiency anemia as well as B12 deficiency, with baseline hemoglobin of 8-10, the patient presents with relative decline in hemoglobin to 7.1 associated with shortness of breath appears to be  consistent with symptomatic acute on chronic anemia.  -Acute upper gastrointestinal bleed appears less likely given the patient's apparent hemodynamic stability as well as finding of a BUN value of has trended down since June 2021.   - No evidence of bright red blood per rectum - she is chronically anticoagulated on Eliquis in the setting of paroxysmal atrial fibrillation, but is otherwise on no blood thinning agents as an outpatient.  -Status post transfusion of 1 unit PRBC - There do not appear to be any indications for urgent overnight endoscopic evaluation at this time.  Could consider as an outpatient if need -Given IV iron -Outpatient follow-up with oncology for possible IV iron infusion need  #) Essential hypertension: stable on home regimen  #) Type 2 diabetes mellitus: Complicated by peripheral neuropathy.  stable  #) COPD: Stable  #) Paroxysmal atrial fibrillation: chronically anticoagulated on Eliquis.   - in sinus rhythm at this time  #) Urinary incontinence: On oxybutynin as an outpatient DISCHARGE CONDITIONS:  Stable CONSULTS OBTAINED:   DRUG ALLERGIES:   Allergies  Allergen Reactions  . Sulfa Antibiotics Swelling  . Celecoxib Nausea And Vomiting  . Acetaminophen Itching  . Codeine Rash  . Lyrica [Pregabalin] Rash  . Penicillin G Rash    Has patient had a PCN reaction causing immediate rash, facial/tongue/throat swelling, SOB or lightheadedness with hypotension: Yes Has patient had a PCN reaction causing severe rash involving mucus membranes or skin necrosis: No Has patient had a PCN reaction that required hospitalization: No Has patient had a PCN reaction occurring within the last 10 years: Unknown If all of  the above answers are "NO", then may proceed with Cephalosporin use.  Marland Kitchen Petrolatum-Zinc Oxide Rash   DISCHARGE MEDICATIONS:   Allergies as of 09/10/2020      Reactions   Sulfa Antibiotics Swelling   Celecoxib Nausea And Vomiting   Acetaminophen  Itching   Codeine Rash   Lyrica [pregabalin] Rash   Penicillin G Rash   Has patient had a PCN reaction causing immediate rash, facial/tongue/throat swelling, SOB or lightheadedness with hypotension: Yes Has patient had a PCN reaction causing severe rash involving mucus membranes or skin necrosis: No Has patient had a PCN reaction that required hospitalization: No Has patient had a PCN reaction occurring within the last 10 years: Unknown If all of the above answers are "NO", then may proceed with Cephalosporin use.   Petrolatum-zinc Oxide Rash      Medication List    STOP taking these medications   b complex vitamins capsule   docusate sodium 100 MG capsule Commonly known as: COLACE   furosemide 20 MG tablet Commonly known as: LASIX   Magnesium 250 MG Tabs   methocarbamol 500 MG tablet Commonly known as: ROBAXIN   montelukast 10 MG tablet Commonly known as: SINGULAIR   multivitamin with minerals Tabs tablet   oxyCODONE 5 MG immediate release tablet Commonly known as: Oxy IR/ROXICODONE   SALONPAS PAIN RELIEF PATCH EX   sodium chloride 1 g tablet     TAKE these medications   albuterol 108 (90 Base) MCG/ACT inhaler Commonly known as: VENTOLIN HFA Inhale 2 puffs into the lungs every 4 (four) hours as needed for wheezing or shortness of breath.   amLODipine-benazepril 5-20 MG capsule Commonly known as: LOTREL Take 1 capsule by mouth daily.   Breo Ellipta 100-25 MCG/INH Aepb Generic drug: fluticasone furoate-vilanterol TAKE 1 PUFF ONCE A DAY   desloratadine 5 MG tablet Commonly known as: CLARINEX Take 1 tablet (5 mg total) by mouth daily.   Eliquis 2.5 MG Tabs tablet Generic drug: apixaban TAKE ONE TABLET TWICE DAILY What changed: how much to take   feeding supplement Liqd Take 1 Container by mouth as needed. What changed: Another medication with the same name was removed. Continue taking this medication, and follow the directions you see here.   gabapentin  300 MG capsule Commonly known as: NEURONTIN Take 2 capsules (600 mg total) by mouth at bedtime.   hydrochlorothiazide 25 MG tablet Commonly known as: HYDRODIURIL Take 1 tablet (25 mg total) by mouth daily.   Iron 28 MG Tabs Take 1 tablet (28 mg total) by mouth daily. Or any iron supplements over-the-counter.   metFORMIN 500 MG 24 hr tablet Commonly known as: GLUCOPHAGE-XR Take 2 tablets (1,000 mg total) by mouth daily.   nitrofurantoin (macrocrystal-monohydrate) 100 MG capsule Commonly known as: Macrobid Take 1 capsule (100 mg total) by mouth 2 (two) times daily.   nystatin cream Commonly known as: MYCOSTATIN Apply topically 2 (two) times daily.   OneTouch Ultra test strip Generic drug: glucose blood 1 each by Other route daily. Use as instructed   oxybutynin 10 MG 24 hr tablet Commonly known as: DITROPAN-XL TAKE 1 TABLET BY MOUTH DAILY   senna-docusate 8.6-50 MG tablet Commonly known as: Senokot-S Take 1 tablet by mouth at bedtime as needed for mild constipation.   theophylline 100 MG 24 hr capsule Commonly known as: Theo-24 TAKE 1 CAPSULE BY MOUTH EVERY DAY What changed:   how much to take  how to take this  when to take this  triamcinolone 0.025 % cream Commonly known as: KENALOG Apply 1 application topically 2 (two) times daily. Ok to mix with topical lotion and apply to all affected areas.      DISCHARGE INSTRUCTIONS:   DIET:  Diabetic diet DISCHARGE CONDITION:  Stable ACTIVITY:  Activity as tolerated OXYGEN:  Home Oxygen: No.  Oxygen Delivery: room air DISCHARGE LOCATION:  home with home health PT/OT  If you experience worsening of your admission symptoms, develop shortness of breath, life threatening emergency, suicidal or homicidal thoughts you must seek medical attention immediately by calling 911 or calling your MD immediately  if symptoms less severe.  You Must read complete instructions/literature along with all the possible adverse  reactions/side effects for all the Medicines you take and that have been prescribed to you. Take any new Medicines after you have completely understood and accpet all the possible adverse reactions/side effects.   Please note  You were cared for by a hospitalist during your hospital stay. If you have any questions about your discharge medications or the care you received while you were in the hospital after you are discharged, you can call the unit and asked to speak with the hospitalist on call if the hospitalist that took care of you is not available. Once you are discharged, your primary care physician will handle any further medical issues. Please note that NO REFILLS for any discharge medications will be authorized once you are discharged, as it is imperative that you return to your primary care physician (or establish a relationship with a primary care physician if you do not have one) for your aftercare needs so that they can reassess your need for medications and monitor your lab values.    On the day of Discharge:  VITAL SIGNS:  Blood pressure (!) 152/60, pulse 62, temperature 98.2 F (36.8 C), temperature source Oral, resp. rate 17, height 5\' 4"  (1.626 m), weight 64.9 kg, SpO2 95 %. PHYSICAL EXAMINATION:  GENERAL:  86 y.o.-year-old patient lying in the bed with no acute distress.  EYES: Pupils equal, round, reactive to light and accommodation. No scleral icterus. Extraocular muscles intact.  HEENT: Head atraumatic, normocephalic. Oropharynx and nasopharynx clear.  NECK:  Supple, no jugular venous distention. No thyroid enlargement, no tenderness.  LUNGS: Normal breath sounds bilaterally, no wheezing, rales,rhonchi or crepitation. No use of accessory muscles of respiration.  CARDIOVASCULAR: S1, S2 normal. No murmurs, rubs, or gallops.  ABDOMEN: Soft, non-tender, non-distended. Bowel sounds present. No organomegaly or mass.  EXTREMITIES: No pedal edema, cyanosis, or clubbing.    NEUROLOGIC: Cranial nerves II through XII are intact. Muscle strength 5/5 in all extremities. Sensation intact. Gait not checked.  PSYCHIATRIC: The patient is alert and oriented x 3.  SKIN: No obvious rash, lesion, or ulcer.  DATA REVIEW:   CBC Recent Labs  Lab 09/10/20 0416  WBC 7.6  HGB 8.6*  HCT 29.0*  PLT 372    Chemistries  Recent Labs  Lab 09/09/20 0154 09/09/20 0154 09/10/20 0416  NA 136   < > 136  K 3.5   < > 3.9  CL 104   < > 104  CO2 24   < > 24  GLUCOSE 129*   < > 143*  BUN 9   < > 8  CREATININE 0.49   < > 0.74  CALCIUM 9.0   < > 9.5  MG 1.7  --   --   AST 17  --   --   ALT 13  --   --  ALKPHOS 66  --   --   BILITOT 1.9*  --   --    < > = values in this interval not displayed.     Outpatient follow-up  Follow-up Information    Lavera Guise, MD. Schedule an appointment as soon as possible for a visit on 09/16/2020.   Specialty: Internal Medicine Why: at [address] Contact information: [address] [place] [address] [phone]        Sindy Guadeloupe, MD. Schedule an appointment as soon as possible for a visit in 1 month(s).   Specialty: Oncology Contact information: [place] [address] [phone]                Management plans discussed with the patient, family/daughter and they are in agreement.  CODE STATUS: Prior   TOTAL TIME TAKING CARE OF THIS PATIENT: 45 minutes.    Max Sane M.D on 09/11/2020 at 11:26 AM  [facility]   CC: Primary care physician; [name], NP   Note: This dictation was prepared with Dragon dictation along with smaller phrase technology. Any transcriptional errors that result from this process are unintentional.

## 2020-09-12 ENCOUNTER — Telehealth: Payer: Self-pay

## 2020-09-12 LAB — METHYLMALONIC ACID, SERUM: Methylmalonic Acid, Quantitative: 268 nmol/L (ref 0–378)

## 2020-09-12 NOTE — Telephone Encounter (Signed)
PT plan of care signed and faxed back to [facility] at [phone] and copy placed in scan.

## 2020-09-16 ENCOUNTER — Other Ambulatory Visit: Payer: Self-pay

## 2020-09-16 ENCOUNTER — Encounter: Payer: Self-pay | Admitting: Internal Medicine

## 2020-09-16 ENCOUNTER — Ambulatory Visit (INDEPENDENT_AMBULATORY_CARE_PROVIDER_SITE_OTHER): Payer: Medicare Other | Admitting: Internal Medicine

## 2020-09-16 VITALS — BP 138/68 | HR 99 | Temp 98.4°F | Resp 16 | Ht 64.0 in | Wt 142.8 lb

## 2020-09-16 DIAGNOSIS — R269 Unspecified abnormalities of gait and mobility: Secondary | ICD-10-CM

## 2020-09-16 DIAGNOSIS — D649 Anemia, unspecified: Secondary | ICD-10-CM | POA: Diagnosis not present

## 2020-09-16 DIAGNOSIS — Z23 Encounter for immunization: Secondary | ICD-10-CM | POA: Diagnosis not present

## 2020-09-16 DIAGNOSIS — R531 Weakness: Secondary | ICD-10-CM | POA: Diagnosis not present

## 2020-09-16 DIAGNOSIS — S72001S Fracture of unspecified part of neck of right femur, sequela: Secondary | ICD-10-CM

## 2020-09-16 NOTE — Progress Notes (Signed)
[facility] [place], [place] [address]  Internal MEDICINE  Office Visit Note  Patient Name: Monica [name]  [id number]  [id number]  Date of Service: 09/16/2020     Chief Complaint  Patient presents with  . Hospitalization Follow-up    microcytic anemia                                   Transitional care managment HPI Pt is here for recent hospital follow up, pt was hospitalized for acute sob and was found to be anemic. She does have h/o chronic iron and B12 deficiency, her hg was found to be 7.1. she was transfused and was discharged home. Pt also has closed fracture of neck of right femur, s/p post hip hemiarthroplasty. Still has a knot and swelling on the right side. She has been feeling weak and has difficulty walking. She feels like she might lose her balance    .   Current Medication: Outpatient Encounter Medications as of 09/16/2020  Medication Sig  . albuterol (VENTOLIN HFA) 108 (90 Base) MCG/ACT inhaler Inhale 2 puffs into the lungs every 4 (four) hours as needed for wheezing or shortness of breath.  Marland Kitchen amLODipine-benazepril (LOTREL) 5-20 MG capsule Take 1 capsule by mouth daily.  Marland Kitchen desloratadine (CLARINEX) 5 MG tablet Take 1 tablet (5 mg total) by mouth daily.  Marland Kitchen ELIQUIS 2.5 MG TABS tablet TAKE ONE TABLET TWICE DAILY (Patient taking differently: Take 2.5 mg by mouth 2 (two) times daily. )  . feeding supplement (BOOST HIGH PROTEIN) LIQD Take 1 Container by mouth as needed.  . Ferrous Sulfate (IRON) 28 MG TABS Take 1 tablet (28 mg total) by mouth daily. Or any iron supplements over-the-counter.  . fluticasone furoate-vilanterol (BREO ELLIPTA) 100-25 MCG/INH AEPB TAKE 1 PUFF ONCE A DAY  . gabapentin (NEURONTIN) 300 MG capsule Take 2 capsules (600 mg total) by mouth at bedtime.  Marland Kitchen glucose blood (ONETOUCH ULTRA) test strip 1 each by Other route daily. Use as instructed  . hydrochlorothiazide (HYDRODIURIL) 25 MG tablet Take 1 tablet (25 mg total) by mouth  daily.  . metFORMIN (GLUCOPHAGE-XR) 500 MG 24 hr tablet Take 2 tablets (1,000 mg total) by mouth daily.  Marland Kitchen nystatin cream (MYCOSTATIN) Apply topically 2 (two) times daily.  Glory Rosebush Delica Lancets 27C MISC Use as directed once daily as needed  . oxybutynin (DITROPAN-XL) 10 MG 24 hr tablet TAKE 1 TABLET BY MOUTH DAILY (Patient taking differently: Take 10 mg by mouth daily. )  . senna-docusate (SENOKOT-S) 8.6-50 MG tablet Take 1 tablet by mouth at bedtime as needed for mild constipation.  . theophylline (THEO-24) 100 MG 24 hr capsule TAKE 1 CAPSULE BY MOUTH EVERY DAY (Patient taking differently: Take 100 mg by mouth daily. TAKE 1 CAPSULE BY MOUTH EVERY DAY)  . triamcinolone (KENALOG) 0.025 % cream Apply 1 application topically 2 (two) times daily. Ok to mix with topical lotion and apply to all affected areas.  . [DISCONTINUED] famotidine (PEPCID) 20 MG tablet Take 1 tablet (20 mg total) by mouth 2 (two) times daily.  . [DISCONTINUED] pravastatin (PRAVACHOL) 40 MG tablet Take 1 tablet (40 mg total) by mouth daily.   No facility-administered encounter medications on file as of 09/16/2020.    Surgical History: Past Surgical History:  Procedure Laterality Date  . ABDOMINAL HYSTERECTOMY  1975  . BLADDER SUSPENSION  1989  . CAROTID ANGIOGRAPHY Right 11/23/2019  Procedure: CAROTID ANGIOGRAPHY;  Surgeon: Katha Cabal, MD;  Location: [facility];  Service: Cardiovascular;  Laterality: Right;  . COLONOSCOPY    . COLONOSCOPY WITH PROPOFOL N/A 01/25/2018   Procedure: COLONOSCOPY WITH PROPOFOL;  Surgeon: [name], MD;  Location: [facility] ENDOSCOPY;  Service: Endoscopy;  Laterality: N/A;  . ESOPHAGOGASTRODUODENOSCOPY (EGD) WITH PROPOFOL N/A 01/24/2018   Procedure: ESOPHAGOGASTRODUODENOSCOPY (EGD) WITH PROPOFOL;  Surgeon: [name], MD;  Location: [facility] ENDOSCOPY;  Service: Endoscopy;  Laterality: N/A;  . HIP ARTHROPLASTY Left 06/03/2019   Procedure: ARTHROPLASTY BIPOLAR HIP  (HEMIARTHROPLASTY);  Surgeon: Dereck Leep, MD;  Location: [facility] ORS;  Service: Orthopedics;  Laterality: Left;  . HIP ARTHROPLASTY Right 04/21/2020   Procedure: ARTHROPLASTY BIPOLAR HIP (HEMIARTHROPLASTY);  Surgeon: Corky Mull, MD;  Location: [facility] ORS;  Service: Orthopedics;  Laterality: Right;    Medical History: Past Medical History:  Diagnosis Date  . Atrial fibrillation ([place])   . Bronchitis   . Cancer ([place])    Left leg growth, kidneys, lungs and breasts  . Carcinoma of unknown primary ([place])   . COPD (chronic obstructive pulmonary disease) ([place])   . Diabetes mellitus without complication ([place])   . Hyperlipidemia   . Hypertension   . Pancreatitis   . Pneumonia   . Stroke ([place])    TIA's  . Vitamin B12 deficiency     Family History: Family History  Problem Relation Age of Onset  . Cancer Father   . Diabetes Brother     Social History   Socioeconomic History  . Marital status: Divorced    Spouse name: Not on file  . Number of children: Not on file  . Years of education: Not on file  . Highest education level: Not on file  Occupational History  . Not on file  Tobacco Use  . Smoking status: Former Smoker    Packs/day: 0.50    Years: 15.00    Pack years: 7.50    Types: Cigarettes    Quit date: 11/15/1988    Years since quitting: 31.8  . Smokeless tobacco: Never Used  Vaping Use  . Vaping Use: Never used  Substance and Sexual Activity  . Alcohol use: No    Alcohol/week: 0.0 standard drinks  . Drug use: No  . Sexual activity: Not on file    Comment: hysterectomy   Other Topics Concern  . Not on file  Social History Narrative  . Not on file   Social Determinants of Health   Financial Resource Strain:   . Difficulty of Paying Living Expenses: Not on file  Food Insecurity:   . Worried About Charity fundraiser in the Last Year: Not on file  . Ran Out of Food in the Last Year: Not on file  Transportation Needs:   . Lack of Transportation (Medical): Not  on file  . Lack of Transportation (Non-Medical): Not on file  Physical Activity:   . Days of Exercise per Week: Not on file  . Minutes of Exercise per Session: Not on file  Stress:   . Feeling of Stress : Not on file  Social Connections:   . Frequency of Communication with Friends and Family: Not on file  . Frequency of Social Gatherings with Friends and Family: Not on file  . Attends Religious Services: Not on file  . Active Member of Clubs or Organizations: Not on file  . Attends Archivist Meetings: Not on file  . Marital Status: Not on file  Intimate Partner Violence:   . Fear of Current or Ex-Partner: Not on file  . Emotionally Abused: Not on file  . Physically Abused: Not on file  . Sexually Abused: Not on file      Review of Systems  Constitutional: Positive for fatigue. Negative for chills and diaphoresis.  HENT: Negative for ear pain, postnasal drip and sinus pressure.   Eyes: Negative for photophobia, discharge, redness, itching and visual disturbance.  Respiratory: Negative for cough, shortness of breath and wheezing.   Cardiovascular: Negative for chest pain, palpitations and leg swelling.  Gastrointestinal: Negative for abdominal pain, constipation, diarrhea, nausea and vomiting.  Genitourinary: Negative for dysuria and flank pain.  Musculoskeletal: Positive for arthralgias and gait problem. Negative for back pain and neck pain.  Skin: Negative for color change.  Allergic/Immunologic: Negative for environmental allergies and food allergies.  Neurological: Positive for weakness. Negative for dizziness and headaches.  Hematological: Does not bruise/bleed easily.  Psychiatric/Behavioral: Negative for agitation, behavioral problems (depression) and hallucinations.    Vital Signs: BP 138/68   Pulse 99   Temp 98.4 F (36.9 C)   Resp 16   Ht 5\' 4"  (1.626 m)   Wt 142 lb 12.8 oz (64.8 kg)   SpO2 96%   BMI 24.51 kg/m    Physical Exam Constitutional:       General: She is not in acute distress.    Appearance: She is well-developed. She is not diaphoretic.  HENT:     Head: Normocephalic and atraumatic.     Mouth/Throat:     Pharynx: No oropharyngeal exudate.  Eyes:     Pupils: Pupils are equal, round, and reactive to light.  Neck:     Thyroid: No thyromegaly.     Vascular: No JVD.     Trachea: No tracheal deviation.  Cardiovascular:     Rate and Rhythm: Normal rate and regular rhythm.     Heart sounds: Normal heart sounds. No murmur heard.  No friction rub. No gallop.   Pulmonary:     Effort: Pulmonary effort is normal. No respiratory distress.     Breath sounds: No wheezing or rales.  Chest:     Chest wall: No tenderness.  Abdominal:     General: Bowel sounds are normal.     Palpations: Abdomen is soft.  Musculoskeletal:        General: Normal range of motion.     Cervical back: Normal range of motion and neck supple.  Lymphadenopathy:     Cervical: No cervical adenopathy.  Skin:    General: Skin is warm and dry.     Findings: Lesion present.     Comments: right hip, swelling at the incision site ?? Scar tissue   Neurological:     Mental Status: She is alert and oriented to person, place, and time.     Cranial Nerves: No cranial nerve deficit.  Psychiatric:        Behavior: Behavior normal.        Thought Content: Thought content normal.        Judgment: Judgment normal.     Assessment/Plan: 1. Acute on chronic anemia Hg is stable, pt will have a follow up with Hematology for possible iron infusion, will monitor along   2. Gait abnormality Continue to use rolling walker, fall precautions   - Ambulatory referral to Physical Therapy  3. Weakness generalized Pt is feeling weak after discharge, will order PT  - Ambulatory referral to Physical Therapy  4.  Flu vaccine need - Flu Vaccine MDCK QUAD PF - Ambulatory referral to Physical Therapy  5. Closed fracture of neck of right femur, sequela Continue follow  with ortho  - Ambulatory referral to Physical Therapy  General Counseling: Myrtis verbalizes understanding of the findings of todays visit and agrees with plan of treatment. I have discussed any further diagnostic evaluation that may be needed or ordered today. We also reviewed her medications today. she has been encouraged to call the office with any questions or concerns that should arise related to todays visit.   Orders Placed This Encounter  Procedures  . Flu Vaccine MDCK QUAD PF   I have reviewed all medical records from hospital follow up including radiology reports and consults from other physicians. Appropriate follow up diagnostics will be scheduled as needed. Patient/ Family understands the plan of treatment. Time spen t35 minutes.   Dr Lavera Guise,   Internal Medicine

## 2020-09-17 DIAGNOSIS — M25551 Pain in right hip: Secondary | ICD-10-CM | POA: Diagnosis not present

## 2020-09-17 DIAGNOSIS — R2681 Unsteadiness on feet: Secondary | ICD-10-CM | POA: Diagnosis not present

## 2020-09-17 DIAGNOSIS — M6281 Muscle weakness (generalized): Secondary | ICD-10-CM | POA: Diagnosis not present

## 2020-09-17 DIAGNOSIS — R2689 Other abnormalities of gait and mobility: Secondary | ICD-10-CM | POA: Diagnosis not present

## 2020-09-19 DIAGNOSIS — M25551 Pain in right hip: Secondary | ICD-10-CM | POA: Diagnosis not present

## 2020-09-19 DIAGNOSIS — R2689 Other abnormalities of gait and mobility: Secondary | ICD-10-CM | POA: Diagnosis not present

## 2020-09-19 DIAGNOSIS — R2681 Unsteadiness on feet: Secondary | ICD-10-CM | POA: Diagnosis not present

## 2020-09-19 DIAGNOSIS — M6281 Muscle weakness (generalized): Secondary | ICD-10-CM | POA: Diagnosis not present

## 2020-09-22 DIAGNOSIS — R2681 Unsteadiness on feet: Secondary | ICD-10-CM | POA: Diagnosis not present

## 2020-09-22 DIAGNOSIS — R2689 Other abnormalities of gait and mobility: Secondary | ICD-10-CM | POA: Diagnosis not present

## 2020-09-22 DIAGNOSIS — M6281 Muscle weakness (generalized): Secondary | ICD-10-CM | POA: Diagnosis not present

## 2020-09-22 DIAGNOSIS — M25551 Pain in right hip: Secondary | ICD-10-CM | POA: Diagnosis not present

## 2020-09-24 ENCOUNTER — Telehealth: Payer: Self-pay

## 2020-09-24 DIAGNOSIS — R2681 Unsteadiness on feet: Secondary | ICD-10-CM | POA: Diagnosis not present

## 2020-09-24 DIAGNOSIS — M6281 Muscle weakness (generalized): Secondary | ICD-10-CM | POA: Diagnosis not present

## 2020-09-24 DIAGNOSIS — M25551 Pain in right hip: Secondary | ICD-10-CM | POA: Diagnosis not present

## 2020-09-24 DIAGNOSIS — R2689 Other abnormalities of gait and mobility: Secondary | ICD-10-CM | POA: Diagnosis not present

## 2020-09-24 NOTE — Telephone Encounter (Signed)
Physician order signed for PT and OT and faxed back to [facility] [employer] at [phone]. Copy placed in scan.

## 2020-09-25 DIAGNOSIS — Z23 Encounter for immunization: Secondary | ICD-10-CM | POA: Diagnosis not present

## 2020-09-26 DIAGNOSIS — M25551 Pain in right hip: Secondary | ICD-10-CM | POA: Diagnosis not present

## 2020-09-26 DIAGNOSIS — R2681 Unsteadiness on feet: Secondary | ICD-10-CM | POA: Diagnosis not present

## 2020-09-26 DIAGNOSIS — M6281 Muscle weakness (generalized): Secondary | ICD-10-CM | POA: Diagnosis not present

## 2020-09-26 DIAGNOSIS — R2689 Other abnormalities of gait and mobility: Secondary | ICD-10-CM | POA: Diagnosis not present

## 2020-09-29 DIAGNOSIS — M6281 Muscle weakness (generalized): Secondary | ICD-10-CM | POA: Diagnosis not present

## 2020-09-29 DIAGNOSIS — M25551 Pain in right hip: Secondary | ICD-10-CM | POA: Diagnosis not present

## 2020-09-29 DIAGNOSIS — R2681 Unsteadiness on feet: Secondary | ICD-10-CM | POA: Diagnosis not present

## 2020-09-29 DIAGNOSIS — R2689 Other abnormalities of gait and mobility: Secondary | ICD-10-CM | POA: Diagnosis not present

## 2020-10-01 DIAGNOSIS — R2681 Unsteadiness on feet: Secondary | ICD-10-CM | POA: Diagnosis not present

## 2020-10-01 DIAGNOSIS — M25551 Pain in right hip: Secondary | ICD-10-CM | POA: Diagnosis not present

## 2020-10-01 DIAGNOSIS — R2689 Other abnormalities of gait and mobility: Secondary | ICD-10-CM | POA: Diagnosis not present

## 2020-10-01 DIAGNOSIS — M6281 Muscle weakness (generalized): Secondary | ICD-10-CM | POA: Diagnosis not present

## 2020-10-02 DIAGNOSIS — R2689 Other abnormalities of gait and mobility: Secondary | ICD-10-CM | POA: Diagnosis not present

## 2020-10-02 DIAGNOSIS — R2681 Unsteadiness on feet: Secondary | ICD-10-CM | POA: Diagnosis not present

## 2020-10-02 DIAGNOSIS — M6281 Muscle weakness (generalized): Secondary | ICD-10-CM | POA: Diagnosis not present

## 2020-10-02 DIAGNOSIS — M25551 Pain in right hip: Secondary | ICD-10-CM | POA: Diagnosis not present

## 2020-10-03 DIAGNOSIS — M25551 Pain in right hip: Secondary | ICD-10-CM | POA: Diagnosis not present

## 2020-10-03 DIAGNOSIS — R2689 Other abnormalities of gait and mobility: Secondary | ICD-10-CM | POA: Diagnosis not present

## 2020-10-03 DIAGNOSIS — M6281 Muscle weakness (generalized): Secondary | ICD-10-CM | POA: Diagnosis not present

## 2020-10-03 DIAGNOSIS — R2681 Unsteadiness on feet: Secondary | ICD-10-CM | POA: Diagnosis not present

## 2020-10-06 DIAGNOSIS — R2681 Unsteadiness on feet: Secondary | ICD-10-CM | POA: Diagnosis not present

## 2020-10-06 DIAGNOSIS — R2689 Other abnormalities of gait and mobility: Secondary | ICD-10-CM | POA: Diagnosis not present

## 2020-10-06 DIAGNOSIS — M25551 Pain in right hip: Secondary | ICD-10-CM | POA: Diagnosis not present

## 2020-10-06 DIAGNOSIS — M6281 Muscle weakness (generalized): Secondary | ICD-10-CM | POA: Diagnosis not present

## 2020-10-07 DIAGNOSIS — M6281 Muscle weakness (generalized): Secondary | ICD-10-CM | POA: Diagnosis not present

## 2020-10-07 DIAGNOSIS — M25551 Pain in right hip: Secondary | ICD-10-CM | POA: Diagnosis not present

## 2020-10-07 DIAGNOSIS — R2689 Other abnormalities of gait and mobility: Secondary | ICD-10-CM | POA: Diagnosis not present

## 2020-10-07 DIAGNOSIS — R2681 Unsteadiness on feet: Secondary | ICD-10-CM | POA: Diagnosis not present

## 2020-10-08 DIAGNOSIS — R2681 Unsteadiness on feet: Secondary | ICD-10-CM | POA: Diagnosis not present

## 2020-10-08 DIAGNOSIS — M25551 Pain in right hip: Secondary | ICD-10-CM | POA: Diagnosis not present

## 2020-10-08 DIAGNOSIS — R2689 Other abnormalities of gait and mobility: Secondary | ICD-10-CM | POA: Diagnosis not present

## 2020-10-08 DIAGNOSIS — M6281 Muscle weakness (generalized): Secondary | ICD-10-CM | POA: Diagnosis not present

## 2020-10-13 DIAGNOSIS — R2681 Unsteadiness on feet: Secondary | ICD-10-CM | POA: Diagnosis not present

## 2020-10-13 DIAGNOSIS — M6281 Muscle weakness (generalized): Secondary | ICD-10-CM | POA: Diagnosis not present

## 2020-10-13 DIAGNOSIS — M25551 Pain in right hip: Secondary | ICD-10-CM | POA: Diagnosis not present

## 2020-10-13 DIAGNOSIS — R2689 Other abnormalities of gait and mobility: Secondary | ICD-10-CM | POA: Diagnosis not present

## 2020-10-15 DIAGNOSIS — M25551 Pain in right hip: Secondary | ICD-10-CM | POA: Diagnosis not present

## 2020-10-15 DIAGNOSIS — M6281 Muscle weakness (generalized): Secondary | ICD-10-CM | POA: Diagnosis not present

## 2020-10-15 DIAGNOSIS — R2681 Unsteadiness on feet: Secondary | ICD-10-CM | POA: Diagnosis not present

## 2020-10-15 DIAGNOSIS — R2689 Other abnormalities of gait and mobility: Secondary | ICD-10-CM | POA: Diagnosis not present

## 2020-10-16 ENCOUNTER — Other Ambulatory Visit: Payer: Self-pay

## 2020-10-16 DIAGNOSIS — Z7901 Long term (current) use of anticoagulants: Secondary | ICD-10-CM

## 2020-10-16 DIAGNOSIS — M6281 Muscle weakness (generalized): Secondary | ICD-10-CM | POA: Diagnosis not present

## 2020-10-16 DIAGNOSIS — R2689 Other abnormalities of gait and mobility: Secondary | ICD-10-CM | POA: Diagnosis not present

## 2020-10-16 DIAGNOSIS — M25551 Pain in right hip: Secondary | ICD-10-CM | POA: Diagnosis not present

## 2020-10-16 DIAGNOSIS — R2681 Unsteadiness on feet: Secondary | ICD-10-CM | POA: Diagnosis not present

## 2020-10-16 MED ORDER — GABAPENTIN 300 MG PO CAPS
600.0000 mg | ORAL_CAPSULE | Freq: Every day | ORAL | 5 refills | Status: DC
Start: 2020-10-16 — End: 2021-05-15

## 2020-10-16 MED ORDER — APIXABAN 2.5 MG PO TABS: 2.5000 mg | ORAL_TABLET | Freq: Two times a day (BID) | ORAL | 3 refills | Status: DC

## 2020-10-16 MED ORDER — AMLODIPINE BESY-BENAZEPRIL HCL 5-20 MG PO CAPS
1.0000 | ORAL_CAPSULE | Freq: Every day | ORAL | 1 refills | Status: DC
Start: 2020-10-16 — End: 2021-05-15

## 2020-10-28 ENCOUNTER — Encounter: Payer: Self-pay | Admitting: Nurse Practitioner

## 2020-10-28 ENCOUNTER — Other Ambulatory Visit: Payer: Self-pay

## 2020-10-28 ENCOUNTER — Ambulatory Visit (INDEPENDENT_AMBULATORY_CARE_PROVIDER_SITE_OTHER): Payer: Medicare Other | Admitting: Nurse Practitioner

## 2020-10-28 VITALS — BP 148/70 | HR 66 | Temp 97.3°F | Resp 16 | Ht 64.0 in | Wt 143.6 lb

## 2020-10-28 DIAGNOSIS — Z0001 Encounter for general adult medical examination with abnormal findings: Secondary | ICD-10-CM

## 2020-10-28 DIAGNOSIS — M25551 Pain in right hip: Secondary | ICD-10-CM

## 2020-10-28 DIAGNOSIS — Z23 Encounter for immunization: Secondary | ICD-10-CM | POA: Diagnosis not present

## 2020-10-28 DIAGNOSIS — L209 Atopic dermatitis, unspecified: Secondary | ICD-10-CM | POA: Diagnosis not present

## 2020-10-28 DIAGNOSIS — Z96641 Presence of right artificial hip joint: Secondary | ICD-10-CM | POA: Diagnosis not present

## 2020-10-28 DIAGNOSIS — J449 Chronic obstructive pulmonary disease, unspecified: Secondary | ICD-10-CM

## 2020-10-28 DIAGNOSIS — E1159 Type 2 diabetes mellitus with other circulatory complications: Secondary | ICD-10-CM

## 2020-10-28 MED ORDER — CLOTRIMAZOLE-BETAMETHASONE 1-0.05 % EX CREA: 1.0000 "application " | TOPICAL_CREAM | Freq: Two times a day (BID) | CUTANEOUS | 1 refills | Status: DC

## 2020-10-28 MED ORDER — PNEUMOCOCCAL 13-VAL CONJ VACC IM SUSP: 0.5000 mL | Freq: Once | INTRAMUSCULAR | 0 refills | Status: AC

## 2020-10-28 NOTE — Progress Notes (Signed)
[facility] [place], [place] [address]  Internal MEDICINE  Office Visit Note  Patient Name: Monica [name]  [id number]  [id number]  Date of Service: 11/02/2020   Pt is here for routine health maintenance examination   Chief Complaint  Patient presents with  . Medicare Wellness    Rash on right ankle, burns and is red, sometimes when pt is walking around she gets shortness of breath, pt had hip surgery and can feel a knot   . Diabetes  . COPD  . Hyperlipidemia  . Hypertension     The patient is here for health maintenance exam. She is having trouble with her right hip. Had partial hip replacement after fracturing it in June. There is bony or soft tissue protrusion generating from just underneath the surgical scar. It feels as though it's pulling on the hip. Hurts. She has seen original surgeon four times and continues to have physical therapy for this. She states that this is not getting any better. Was told by orthopedic that it might be soft tissue, bursa, fluid,  or hematoma. Did have x-ray after that but was never really told what the problem was. She was told to "pinch" the area to break up the soft tissue. This caused increased pain. Physical therapist advised her to stop this, and to discuss with PCP getting a second opinion.  -rash on right ankle which is red, inflamed, itchy, and burning.  -she does need to have routine fasting labs and check of HgbA1c.     Current Medication: Outpatient Encounter Medications as of 10/28/2020  Medication Sig  . albuterol (VENTOLIN HFA) 108 (90 Base) MCG/ACT inhaler Inhale 2 puffs into the lungs every 4 (four) hours as needed for wheezing or shortness of breath.  Marland Kitchen amLODipine-benazepril (LOTREL) 5-20 MG capsule Take 1 capsule by mouth daily.  Marland Kitchen apixaban (ELIQUIS) 2.5 MG TABS tablet Take 1 tablet (2.5 mg total) by mouth 2 (two) times daily.  Marland Kitchen desloratadine (CLARINEX) 5 MG tablet Take 1 tablet (5 mg total) by mouth  daily.  . feeding supplement (BOOST HIGH PROTEIN) LIQD Take 1 Container by mouth as needed.  . Ferrous Sulfate (IRON) 28 MG TABS Take 1 tablet (28 mg total) by mouth daily. Or any iron supplements over-the-counter.  . fluticasone furoate-vilanterol (BREO ELLIPTA) 100-25 MCG/INH AEPB TAKE 1 PUFF ONCE A DAY  . gabapentin (NEURONTIN) 300 MG capsule Take 2 capsules (600 mg total) by mouth at bedtime.  Marland Kitchen glucose blood (ONETOUCH ULTRA) test strip 1 each by Other route daily. Use as instructed  . hydrochlorothiazide (HYDRODIURIL) 25 MG tablet Take 1 tablet (25 mg total) by mouth daily.  . metFORMIN (GLUCOPHAGE-XR) 500 MG 24 hr tablet Take 2 tablets (1,000 mg total) by mouth daily.  Marland Kitchen nystatin cream (MYCOSTATIN) Apply topically 2 (two) times daily.  Glory Rosebush Delica Lancets 85U MISC Use as directed once daily as needed  . oxybutynin (DITROPAN-XL) 10 MG 24 hr tablet TAKE 1 TABLET BY MOUTH DAILY (Patient taking differently: Take 10 mg by mouth daily.)  . senna-docusate (SENOKOT-S) 8.6-50 MG tablet Take 1 tablet by mouth at bedtime as needed for mild constipation.  . theophylline (THEO-24) 100 MG 24 hr capsule TAKE 1 CAPSULE BY MOUTH EVERY DAY (Patient taking differently: Take 100 mg by mouth daily. TAKE 1 CAPSULE BY MOUTH EVERY DAY)  . triamcinolone (KENALOG) 0.025 % cream Apply 1 application topically 2 (two) times daily. Ok to mix with topical lotion and apply to all  affected areas.  . clotrimazole-betamethasone (LOTRISONE) cream Apply 1 application topically 2 (two) times daily.  . [EXPIRED] pneumococcal 13-valent conjugate vaccine (PREVNAR 13) SUSP injection Inject 0.5 mLs into the muscle once for 1 dose.  . [DISCONTINUED] famotidine (PEPCID) 20 MG tablet Take 1 tablet (20 mg total) by mouth 2 (two) times daily.  . [DISCONTINUED] pravastatin (PRAVACHOL) 40 MG tablet Take 1 tablet (40 mg total) by mouth daily.   No facility-administered encounter medications on file as of 10/28/2020.    Surgical  History: Past Surgical History:  Procedure Laterality Date  . ABDOMINAL HYSTERECTOMY  1975  . BLADDER SUSPENSION  1989  . CAROTID ANGIOGRAPHY Right 11/23/2019   Procedure: CAROTID ANGIOGRAPHY;  Surgeon: Katha Cabal, MD;  Location: [facility];  Service: Cardiovascular;  Laterality: Right;  . COLONOSCOPY    . COLONOSCOPY WITH PROPOFOL N/A 01/25/2018   Procedure: COLONOSCOPY WITH PROPOFOL;  Surgeon: [name], MD;  Location: [facility]COPY;  Service: Endoscopy;  Laterality: N/A;  . ESOPHAGOGASTRODUODENOSCOPY (EGD) WITH PROPOFOL N/A 01/24/2018   Procedure: ESOPHAGOGASTRODUODENOSCOPY (EGD) WITH PROPOFOL;  Surgeon: [name], MD;  Location: [facility] ENDOSCOPY;  Service: Endoscopy;  Laterality: N/A;  . HIP ARTHROPLASTY Left 06/03/2019   Procedure: ARTHROPLASTY BIPOLAR HIP (HEMIARTHROPLASTY);  Surgeon: Dereck Leep, MD;  Location: [facility] ORS;  Service: Orthopedics;  Laterality: Left;  . HIP ARTHROPLASTY Right 04/21/2020   Procedure: ARTHROPLASTY BIPOLAR HIP (HEMIARTHROPLASTY);  Surgeon: Corky Mull, MD;  Location: [facility] ORS;  Service: Orthopedics;  Laterality: Right;    Medical History: Past Medical History:  Diagnosis Date  . Atrial fibrillation ([place])   . Bronchitis   . Cancer ([place])    Left leg growth, kidneys, lungs and breasts  . Carcinoma of unknown primary ([place])   . COPD (chronic obstructive pulmonary disease) ([place])   . Diabetes mellitus without complication ([place])   . Hyperlipidemia   . Hypertension   . Pancreatitis   . Pneumonia   . Stroke ([place])    TIA's  . Vitamin B12 deficiency     Family History: Family History  Problem Relation Age of Onset  . Cancer Father   . Diabetes Brother       Review of Systems  Constitutional: Positive for activity change. Negative for chills, fatigue and unexpected weight change.  HENT: Negative for congestion, postnasal drip, rhinorrhea, sneezing and sore throat.   Respiratory: Positive for shortness of breath. Negative for  cough and chest tightness.        Mostly with exertion.   Cardiovascular: Negative for chest pain and palpitations.  Gastrointestinal: Negative for abdominal pain, constipation, diarrhea, nausea and vomiting.  Endocrine: Negative for cold intolerance, heat intolerance, polydipsia and polyuria.       Blood sugars doing well   Genitourinary: Negative for dysuria, frequency and urgency.  Musculoskeletal: Positive for arthralgias and gait problem. Negative for back pain, joint swelling and neck pain.       Right hip pain with palpable abnormality present. Hurts to walk. Hurts with even light palpation. Patient using a walker to help with ambulation.   Skin: Positive for rash.       Rash on right lower leg, just behind the ankle. Bright red and tender to palpate.   Allergic/Immunologic: Positive for environmental allergies.  Neurological: Negative for dizziness, tremors, facial asymmetry, numbness and headaches.  Hematological: Negative for adenopathy. Does not bruise/bleed easily.  Psychiatric/Behavioral: Negative for behavioral problems (Depression), sleep disturbance and suicidal ideas. The patient is not nervous/anxious.  Today's Vitals   10/28/20 1449  BP: (!) 148/70  Pulse: 66  Resp: 16  Temp: (!) 97.3 F (36.3 C)  SpO2: 99%  Weight: 143 lb 9.6 oz (65.1 kg)  Height: 5\' 4"  (1.626 m)   Body mass index is 24.65 kg/m.  Physical Exam Vitals and nursing note reviewed.  Constitutional:      General: She is not in acute distress.    Appearance: Normal appearance. She is well-developed and well-nourished. She is not diaphoretic.  HENT:     Head: Normocephalic and atraumatic.     Nose: Nose normal.     Mouth/Throat:     Mouth: Oropharynx is clear and moist.     Pharynx: No oropharyngeal exudate.  Eyes:     Extraocular Movements: EOM normal.     Pupils: Pupils are equal, round, and reactive to light.  Neck:     Thyroid: No thyromegaly.     Vascular: No carotid bruit or JVD.      Trachea: No tracheal deviation.  Cardiovascular:     Rate and Rhythm: Normal rate and regular rhythm.     Pulses:          Dorsalis pedis pulses are 1+ on the right side and 1+ on the left side.       Posterior tibial pulses are 1+ on the right side and 1+ on the left side.     Heart sounds: Normal heart sounds. No murmur heard. No friction rub. No gallop.   Pulmonary:     Effort: Pulmonary effort is normal. No respiratory distress.     Breath sounds: Normal breath sounds. No wheezing or rales.  Chest:     Chest wall: No tenderness.  Abdominal:     General: Bowel sounds are normal.     Palpations: Abdomen is soft.     Tenderness: There is no abdominal tenderness.  Musculoskeletal:        General: Normal range of motion.     Cervical back: Normal range of motion and neck supple.     Right foot: Normal range of motion. No deformity or bunion.     Left foot: Normal range of motion. No deformity or bunion.     Comments: Moderate right hip tenderness. Palpable, hard, abnormality present. More tender with light palpation. Area of abnormality just under the surgical incision from partial hip replacement.   Feet:     Right foot:     Protective Sensation: 10 sites tested. 10 sites sensed.     Skin integrity: Skin integrity normal.     Toenail Condition: Right toenails are normal.     Left foot:     Protective Sensation: 10 sites tested. 10 sites sensed.     Skin integrity: Skin integrity normal.     Toenail Condition: Left toenails are normal.     Comments: There is mild, nonpitting edema in both feet and lowerr legs.  Lymphadenopathy:     Cervical: No cervical adenopathy.  Skin:    General: Skin is warm and dry.     Comments: There is red, itchy, and tender rash, posterior lower aspect of the right leg.   Neurological:     General: No focal deficit present.     Mental Status: She is alert and oriented to person, place, and time. Mental status is at baseline.     Cranial Nerves:  No cranial nerve deficit.  Psychiatric:        Mood and Affect: Mood and  affect and mood normal.        Behavior: Behavior normal.        Thought Content: Thought content normal.        Judgment: Judgment normal.    Depression screen [facility] 2/9 10/28/2020 02/26/2020 10/31/2019 08/21/2019 07/18/2019  Decreased Interest 0 0 0 0 0  Down, Depressed, Hopeless 0 0 0 0 0  PHQ - 2 Score 0 0 0 0 0  Some recent data might be hidden    Functional Status Survey: Is the patient deaf or have difficulty hearing?: Yes Does the patient have difficulty seeing, even when wearing glasses/contacts?: No Does the patient have difficulty concentrating, remembering, or making decisions?: No Does the patient have difficulty walking or climbing stairs?: Yes Does the patient have difficulty dressing or bathing?: No Does the patient have difficulty doing errands alone such as visiting a doctor's office or shopping?: Yes  MMSE - [place] Exam 10/28/2020 08/21/2019 06/30/2018  Orientation to time 5 5 5   Orientation to Place 5 5 5   Registration 3 3 3   Attention/ Calculation 5 5 5   Recall 3 3 3   Language- name 2 objects 2 2 2   Language- repeat 1 1 1   Language- follow 3 step command 3 3 1   Language- read & follow direction 1 1 0  Write a sentence 0 1 1  Copy design 1 1 0  Total score 29 30 26     Fall Risk  10/28/2020 02/26/2020 10/31/2019 08/21/2019 07/18/2019  Falls in the past year? 1 1 1 1 1   Number falls in past yr: 1 1 1  0 0  Injury with Fall? 1 1 1  0 1  Comment hip cut in head - - hip broke, had surgery  Follow up Falls evaluation completed - - - -     LABS: Recent Results (from the past 2160 hour(s))  Basic metabolic panel     Status: Abnormal   Collection Time: 09/08/20 12:32 PM  Result Value Ref Range   Sodium 135 135 - 145 mmol/L   Potassium 3.8 3.5 - 5.1 mmol/L   Chloride 101 98 - 111 mmol/L   CO2 23 22 - 32 mmol/L   Glucose, Bld 117 (H) 70 - 99 mg/dL    Comment: Glucose reference range  applies only to samples taken after fasting for at least 8 hours.   BUN 12 8 - 23 mg/dL   Creatinine, Ser 0.64 0.44 - 1.00 mg/dL   Calcium 9.6 8.9 - 10.3 mg/dL   GFR, Estimated >60 >60 mL/min    Comment: (NOTE) Calculated using the CKD-EPI Creatinine Equation (2021)    Anion gap 11 5 - 15    Comment: Performed at [facility], [address] [place],  [address]  CBC     Status: Abnormal   Collection Time: 09/08/20 12:32 PM  Result Value Ref Range   WBC 7.5 4.0 - 10.5 K/uL   RBC 3.68 (L) 3.87 - 5.11 MIL/uL   Hemoglobin 7.1 (L) 12.0 - 15.0 g/dL    Comment: Reticulocyte Hemoglobin testing may be clinically indicated, consider ordering this additional test [id number]    HCT 24.9 (L) 36.0 - 46.0 %   MCV 67.7 (L) 80.0 - 100.0 fL   MCH 19.3 (L) 26.0 - 34.0 pg   MCHC 28.5 (L) 30.0 - 36.0 g/dL   RDW 17.5 (H) 11.5 - 15.5 %   Platelets 435 (H) 150 - 400 K/uL   nRBC 0.0 0.0 - 0.2 %  Comment: Performed at [facility], [address] [place], [place] [address]  Troponin I (High Sensitivity)     Status: None   Collection Time: 09/08/20 12:32 PM  Result Value Ref Range   Troponin I (High Sensitivity) 10 <18 ng/L    Comment: (NOTE) Elevated high sensitivity troponin I (hsTnI) values and significant  changes across serial measurements may suggest ACS but many other  chronic and acute conditions are known to elevate hsTnI results.  Refer to the "Links" section for chest pain algorithms and additional  guidance. Performed at [facility], [address] [place], [place] [address]   Reticulocytes     Status: Abnormal   Collection Time: 09/08/20 12:32 PM  Result Value Ref Range   Retic Ct Pct 1.8 0.4 - 3.1 %   RBC. 3.62 (L) 3.87 - 5.11 MIL/uL   Retic Count, Absolute 64.1 19.0 - 186.0 K/uL   Immature Retic Fract 25.2 (H) 2.3 - 15.9 %    Comment: Performed at [facility], [address] [place], [place] [address]  Pathologist smear review      Status: None   Collection Time: 09/08/20 12:32 PM  Result Value Ref Range   Path Review Blood smear is reviewed.     Comment: Patient presents with symptomatic acute on chronic anemia. Microcytic, hypochromic anemia with anisopoikilocytosis including scattered red cell fragments. Morphology of WBCs and platelets within normal limits.  No evidence of circulating blasts. The findings are compatible with the patient's known iron deficiency. Reviewed by Elmon Kirschner, M.D. Performed at [facility], [address] [place], [place] [address]   Ferritin     Status: Abnormal   Collection Time: 09/08/20  2:32 PM  Result Value Ref Range   Ferritin 5 (L) 11 - 307 ng/mL    Comment: Performed at [facility], [address] [place], [place] [address]  Folate     Status: None   Collection Time: 09/08/20  2:32 PM  Result Value Ref Range   Folate 19.8 >5.9 ng/mL    Comment: Performed at [facility], [address] [place], [place] [address]  Iron and TIBC     Status: Abnormal   Collection Time: 09/08/20  2:32 PM  Result Value Ref Range   Iron 12 (L) 28 - 170 ug/dL   TIBC 575 (H) 250 - 450 ug/dL   Saturation Ratios 2 (L) 10.4 - 31.8 %   UIBC 563 ug/dL    Comment: Performed at [facility], [address] [place], [place] [address]  Respiratory Panel by RT PCR (Flu A&B, Covid) - Nasopharyngeal Swab     Status: None   Collection Time: 09/08/20  3:36 PM   Specimen: Nasopharyngeal Swab  Result Value Ref Range   SARS Coronavirus 2 by RT PCR NEGATIVE NEGATIVE    Comment: (NOTE) SARS-CoV-2 target nucleic acids are NOT DETECTED.  The SARS-CoV-2 RNA is generally detectable in upper respiratoy specimens during the acute phase of infection. The lowest concentration of SARS-CoV-2 viral copies this assay can detect is 131 copies/mL. A negative result does not preclude SARS-Cov-2 infection and should not be used as the sole basis for treatment  or other patient management decisions. A negative result may occur with  improper specimen collection/handling, submission of specimen other than nasopharyngeal swab, presence of viral mutation(s) within the areas targeted by this assay, and inadequate number of viral copies (<131 copies/mL). A negative result must be combined with clinical  observations, patient history, and epidemiological information. The expected result is Negative.  Fact Sheet for Patients:  PinkCheek.be  Fact Sheet for Healthcare Providers:  GravelBags.it  This test is no t yet approved or cleared by the [place] FDA and  has been authorized for detection and/or diagnosis of SARS-CoV-2 by FDA under an Emergency Use Authorization (EUA). This EUA will remain  in effect (meaning this test can be used) for the duration of the COVID-19 declaration under Section 564(b)(1) of the Act, 21 U.S.C. section 360bbb-3(b)(1), unless the authorization is terminated or revoked sooner.     Influenza A by PCR NEGATIVE NEGATIVE   Influenza B by PCR NEGATIVE NEGATIVE    Comment: (NOTE) The Xpert Xpress SARS-CoV-2/FLU/RSV assay is intended as an aid in  the diagnosis of influenza from Nasopharyngeal swab specimens and  should not be used as a sole basis for treatment. Nasal washings and  aspirates are unacceptable for Xpert Xpress SARS-CoV-2/FLU/RSV  testing.  Fact Sheet for Patients: PinkCheek.be  Fact Sheet for Healthcare Providers: GravelBags.it  This test is not yet approved or cleared by the [place] FDA and  has been authorized for detection and/or diagnosis of SARS-CoV-2 by  FDA under an Emergency Use Authorization (EUA). This EUA will remain  in effect (meaning this test can be used) for the duration of the  Covid-19 declaration under Section 564(b)(1) of the Act, 21  U.S.C. section  360bbb-3(b)(1), unless the authorization is  terminated or revoked. Performed at [facility], [place], [place] [address]   Troponin I (High Sensitivity)     Status: None   Collection Time: 09/08/20  3:38 PM  Result Value Ref Range   Troponin I (High Sensitivity) 11 <18 ng/L    Comment: (NOTE) Elevated high sensitivity troponin I (hsTnI) values and significant  changes across serial measurements may suggest ACS but many other  chronic and acute conditions are known to elevate hsTnI results.  Refer to the "Links" section for chest pain algorithms and additional  guidance. Performed at [facility], [address] [place], [place] [address]   Protime-INR     Status: None   Collection Time: 09/08/20  3:38 PM  Result Value Ref Range   Prothrombin Time 14.2 11.4 - 15.2 seconds   INR 1.1 0.8 - 1.2    Comment: (NOTE) INR goal varies based on device and disease states. Performed at [facility], [address] [place], [place] [address]   Hepatic function panel     Status: None   Collection Time: 09/08/20  3:38 PM  Result Value Ref Range   Total Protein 7.8 6.5 - 8.1 g/dL   Albumin 4.2 3.5 - 5.0 g/dL   AST 23 15 - 41 U/L   ALT 15 0 - 44 U/L   Alkaline Phosphatase 71 38 - 126 U/L   Total Bilirubin 0.7 0.3 - 1.2 mg/dL   Bilirubin, Direct <0.1 0.0 - 0.2 mg/dL   Indirect Bilirubin NOT CALCULATED 0.3 - 0.9 mg/dL    Comment: Performed at [facility], [address] [place], [place] [address]  Type and screen [place]     Status: None   Collection Time: 09/08/20  3:38 PM  Result Value Ref Range   ABO/RH(D) B POS    Antibody Screen NEG    Sample Expiration 09/11/2020,2359    Unit Number [id number]    Blood Component Type RED CELLS,LR    Unit division 00  Status of Unit ISSUED,FINAL    Transfusion Status OK TO TRANSFUSE    Crossmatch Result      Compatible Performed at [facility],  [address] [place], [place] [address]   BPAM RBC     Status: None   Collection Time: 09/08/20  3:38 PM  Result Value Ref Range   ISSUE DATE / TIME [id number]    Blood Product Unit Number [id number]    PRODUCT CODE [id number]    Unit Type and Rh 7300    Blood Product Expiration Date 970263785885   Prepare RBC (crossmatch)     Status: None   Collection Time: 09/08/20  4:00 PM  Result Value Ref Range   Order Confirmation      ORDER PROCESSED BY BLOOD BANK Performed at [facility], [address] [place], [place] [address]   Glucose, capillary     Status: None   Collection Time: 09/08/20  6:04 PM  Result Value Ref Range   Glucose-Capillary 96 70 - 99 mg/dL    Comment: Glucose reference range applies only to samples taken after fasting for at least 8 hours.  Hemoglobin and hematocrit, blood     Status: Abnormal   Collection Time: 09/08/20  9:15 PM  Result Value Ref Range   Hemoglobin 7.5 (L) 12.0 - 15.0 g/dL   HCT 26.6 (L) 36.0 - 46.0 %    Comment: Performed at [facility], [address] [place], [place] [address]  Methylmalonic acid, serum     Status: None   Collection Time: 09/08/20  9:15 PM  Result Value Ref Range   Methylmalonic Acid, Quantitative 268 0 - 378 nmol/L   Disclaimer: Comment     Comment: (NOTE) This test was developed and its performance characteristics determined by [facility]. It has not been cleared or approved by the Food and Drug Administration. Performed At: [facility] [place], [place] [id number] Rush Farmer MD SJ:[id number]   Glucose, capillary     Status: Abnormal   Collection Time: 09/08/20 10:19 PM  Result Value Ref Range   Glucose-Capillary 138 (H) 70 - 99 mg/dL    Comment: Glucose reference range applies only to samples taken after fasting for at least 8 hours.  MRSA PCR Screening     Status: None   Collection Time: 09/08/20 11:24 PM   Specimen: Nasopharyngeal  Result Value Ref Range    MRSA by PCR NEGATIVE NEGATIVE    Comment:        The GeneXpert MRSA Assay (FDA approved for NASAL specimens only), is one component of a comprehensive MRSA colonization surveillance program. It is not intended to diagnose MRSA infection nor to guide or monitor treatment for MRSA infections. Performed at [facility], [address] [place], [place] [address]   Glucose, capillary     Status: Abnormal   Collection Time: 09/09/20  1:53 AM  Result Value Ref Range   Glucose-Capillary 122 (H) 70 - 99 mg/dL    Comment: Glucose reference range applies only to samples taken after fasting for at least 8 hours.   Comment 1 Notify RN    Comment 2 Document in Chart   Protime-INR     Status: None   Collection Time: 09/09/20  1:54 AM  Result Value Ref Range   Prothrombin Time 14.3 11.4 - 15.2 seconds   INR 1.2 0.8 - 1.2    Comment: (NOTE) INR goal varies based on device and disease states. Performed at [facility], [address] [place], [place] [address]   Comprehensive metabolic panel     Status: Abnormal   Collection Time: 09/09/20  1:54 AM  Result Value Ref Range   Sodium 136 135 - 145 mmol/L   Potassium 3.5 3.5 - 5.1 mmol/L   Chloride 104 98 - 111 mmol/L   CO2 24 22 - 32 mmol/L   Glucose, Bld 129 (H) 70 - 99 mg/dL    Comment: Glucose reference range applies only to samples taken after fasting for at least 8 hours.   BUN 9 8 - 23 mg/dL   Creatinine, Ser 0.49 0.44 - 1.00 mg/dL   Calcium 9.0 8.9 - 10.3 mg/dL   Total Protein 6.6 6.5 - 8.1 g/dL   Albumin 3.7 3.5 - 5.0 g/dL   AST 17 15 - 41 U/L   ALT 13 0 - 44 U/L   Alkaline Phosphatase 66 38 - 126 U/L   Total Bilirubin 1.9 (H) 0.3 - 1.2 mg/dL   GFR, Estimated >60 >60 mL/min    Comment: (NOTE) Calculated using the CKD-EPI Creatinine Equation (2021)    Anion gap 8 5 - 15    Comment: Performed at [facility], [address] [place], [place] [address]  CBC     Status: Abnormal   Collection Time:  09/09/20  1:54 AM  Result Value Ref Range   WBC 8.1 4.0 - 10.5 K/uL   RBC 4.08 3.87 - 5.11 MIL/uL   Hemoglobin 8.2 (L) 12.0 - 15.0 g/dL    Comment: Reticulocyte Hemoglobin testing may be clinically indicated, consider ordering this additional test [id number]    HCT 28.3 (L) 36.0 - 46.0 %   MCV 69.4 (L) 80.0 - 100.0 fL   MCH 20.1 (L) 26.0 - 34.0 pg   MCHC 29.0 (L) 30.0 - 36.0 g/dL   RDW 18.3 (H) 11.5 - 15.5 %   Platelets 420 (H) 150 - 400 K/uL   nRBC 0.0 0.0 - 0.2 %    Comment: Performed at [facility], [address], [place], [place] [address]  Magnesium     Status: None   Collection Time: 09/09/20  1:54 AM  Result Value Ref Range   Magnesium 1.7 1.7 - 2.4 mg/dL    Comment: Performed at [facility], [address] [place], [place] [address]  Glucose, capillary     Status: Abnormal   Collection Time: 09/09/20  7:42 AM  Result Value Ref Range   Glucose-Capillary 132 (H) 70 - 99 mg/dL    Comment: Glucose reference range applies only to samples taken after fasting for at least 8 hours.  Hemoglobin and hematocrit, blood     Status: Abnormal   Collection Time: 09/09/20  9:06 AM  Result Value Ref Range   Hemoglobin 8.6 (L) 12.0 - 15.0 g/dL   HCT 29.1 (L) 36.0 - 46.0 %    Comment: Performed at [facility], [address] [place], [place] [address]  Glucose, capillary     Status: Abnormal   Collection Time: 09/09/20  2:25 PM  Result Value Ref Range   Glucose-Capillary 187 (H) 70 - 99 mg/dL    Comment: Glucose reference range applies only to samples taken after fasting for at least 8 hours.  Glucose, capillary     Status: Abnormal   Collection Time: 09/09/20  9:42 PM  Result Value Ref Range   Glucose-Capillary 161 (H) 70 - 99 mg/dL    Comment: Glucose reference range applies  only to samples taken after fasting for at least 8 hours.   Comment 1 Notify RN   Glucose, capillary     Status: Abnormal   Collection Time: 09/10/20  2:04 AM  Result Value  Ref Range   Glucose-Capillary 129 (H) 70 - 99 mg/dL    Comment: Glucose reference range applies only to samples taken after fasting for at least 8 hours.   Comment 1 Notify RN   CBC     Status: Abnormal   Collection Time: 09/10/20  4:16 AM  Result Value Ref Range   WBC 7.6 4.0 - 10.5 K/uL   RBC 4.07 3.87 - 5.11 MIL/uL   Hemoglobin 8.6 (L) 12.0 - 15.0 g/dL    Comment: Reticulocyte Hemoglobin testing may be clinically indicated, consider ordering this additional test [id number]    HCT 29.0 (L) 36.0 - 46.0 %   MCV 71.3 (L) 80.0 - 100.0 fL   MCH 21.1 (L) 26.0 - 34.0 pg   MCHC 29.7 (L) 30.0 - 36.0 g/dL   RDW 19.2 (H) 11.5 - 15.5 %   Platelets 372 150 - 400 K/uL   nRBC 0.0 0.0 - 0.2 %    Comment: Performed at [facility], [address] [place], [place] [address]  Basic metabolic panel     Status: Abnormal   Collection Time: 09/10/20  4:16 AM  Result Value Ref Range   Sodium 136 135 - 145 mmol/L   Potassium 3.9 3.5 - 5.1 mmol/L   Chloride 104 98 - 111 mmol/L   CO2 24 22 - 32 mmol/L   Glucose, Bld 143 (H) 70 - 99 mg/dL    Comment: Glucose reference range applies only to samples taken after fasting for at least 8 hours.   BUN 8 8 - 23 mg/dL   Creatinine, Ser 0.74 0.44 - 1.00 mg/dL   Calcium 9.5 8.9 - 10.3 mg/dL   GFR, Estimated >60 >60 mL/min    Comment: (NOTE) Calculated using the CKD-EPI Creatinine Equation (2021)    Anion gap 8 5 - 15    Comment: Performed at [facility], [address] [place], [place] [address]    Assessment/Plan: 1. Encounter for general adult medical examination with abnormal findings Annual health maintenance exam today. Order slip given to check routine, fasting labs.   2. Right hip pain More severe after having partial right hip replacement. Refer to new orthopedic provider for further evaluation and treatment.  - Ambulatory referral to Orthopedic Surgery  3. S/P hip replacement, right Refer to new orthopedic provider for  further evaluation and treatment.  - Ambulatory referral to Orthopedic Surgery  4. Atopic dermatitis, unspecified type May use lotrisone cream. Apply to effected areas twice daily for next 10 days.  - clotrimazole-betamethasone (LOTRISONE) cream; Apply 1 application topically 2 (two) times daily.  Dispense: 45 g; Refill: 1  5. Chronic obstructive pulmonary disease, unspecified COPD type ([place]) Stable. Continue inhalers and respiratory medication as prescribed   6. Type 2 diabetes mellitus with vascular disease ([place]) Check HgbA1c with fasting labs. Continue medication as prescribed. Adjust as indicated.   7. Need for vaccination against Streptococcus pneumoniae using pneumococcal conjugate vaccine 13 Prescription for prevnar 13 vaccine sent to her pharmacy for administration.  - pneumococcal 13-valent conjugate vaccine (PREVNAR 13) SUSP injection; Inject 0.5 mLs into the muscle once for 1 dose.  Dispense: 0.5 mL; Refill: 0  General Counseling: Marshell verbalizes understanding of the findings of todays visit and agrees with plan of  treatment. I have discussed any further diagnostic evaluation that may be needed or ordered today. We also reviewed her medications today. she has been encouraged to call the office with any questions or concerns that should arise related to todays visit.    Counseling:  This patient was seen by Leretha Pol FNP Collaboration with Dr Lavera Guise as a part of collaborative care agreement  Orders Placed This Encounter  Procedures  . Ambulatory referral to Orthopedic Surgery    Meds ordered this encounter  Medications  . pneumococcal 13-valent conjugate vaccine (PREVNAR 13) SUSP injection    Sig: Inject 0.5 mLs into the muscle once for 1 dose.    Dispense:  0.5 mL    Refill:  0    Order Specific Question:   Supervising Provider    Answer:   Lavera Guise [[id number]]  . clotrimazole-betamethasone (LOTRISONE) cream    Sig: Apply 1 application topically 2 (two)  times daily.    Dispense:  45 g    Refill:  1    Order Specific Question:   Supervising Provider    Answer:   Lavera Guise [[id number]]    Total time spent: 26 Minutes  Time spent includes review of chart, medications, test results, and follow up plan with the patient.     Lavera Guise, MD  Internal Medicine

## 2020-10-29 ENCOUNTER — Telehealth: Payer: Self-pay

## 2020-10-29 NOTE — Telephone Encounter (Signed)
Pt plan of care signed by provider. [employer] specialist Philippa Sicks was ready for pickup at front desk.

## 2020-11-02 DIAGNOSIS — Z96641 Presence of right artificial hip joint: Secondary | ICD-10-CM | POA: Insufficient documentation

## 2020-11-02 DIAGNOSIS — Z23 Encounter for immunization: Secondary | ICD-10-CM | POA: Insufficient documentation

## 2020-11-02 DIAGNOSIS — M25551 Pain in right hip: Secondary | ICD-10-CM | POA: Insufficient documentation

## 2020-11-05 DIAGNOSIS — M25551 Pain in right hip: Secondary | ICD-10-CM | POA: Diagnosis not present

## 2020-11-05 DIAGNOSIS — R2681 Unsteadiness on feet: Secondary | ICD-10-CM | POA: Diagnosis not present

## 2020-11-05 DIAGNOSIS — R2689 Other abnormalities of gait and mobility: Secondary | ICD-10-CM | POA: Diagnosis not present

## 2020-11-05 DIAGNOSIS — M6281 Muscle weakness (generalized): Secondary | ICD-10-CM | POA: Diagnosis not present

## 2020-11-13 DIAGNOSIS — M25551 Pain in right hip: Secondary | ICD-10-CM | POA: Diagnosis not present

## 2020-11-17 ENCOUNTER — Other Ambulatory Visit: Payer: Self-pay | Admitting: Internal Medicine

## 2020-11-17 DIAGNOSIS — R350 Frequency of micturition: Secondary | ICD-10-CM

## 2020-11-17 MED ORDER — METFORMIN HCL ER 500 MG PO TB24
500.0000 mg | ORAL_TABLET | Freq: Every day | ORAL | 2 refills | Status: DC
Start: 2020-11-17 — End: 2020-11-25

## 2020-11-17 MED ORDER — THEOPHYLLINE ER 100 MG PO CP24
ORAL_CAPSULE | ORAL | 3 refills | Status: DC
Start: 2020-11-17 — End: 2021-03-17

## 2020-11-25 ENCOUNTER — Other Ambulatory Visit: Payer: Self-pay

## 2020-11-25 MED ORDER — METFORMIN HCL ER 500 MG PO TB24
500.0000 mg | ORAL_TABLET | Freq: Every day | ORAL | 2 refills | Status: DC
Start: 2020-11-25 — End: 2021-07-07

## 2020-12-19 ENCOUNTER — Other Ambulatory Visit: Payer: Self-pay

## 2020-12-19 DIAGNOSIS — L239 Allergic contact dermatitis, unspecified cause: Secondary | ICD-10-CM

## 2020-12-19 DIAGNOSIS — L209 Atopic dermatitis, unspecified: Secondary | ICD-10-CM

## 2020-12-19 MED ORDER — DESLORATADINE 5 MG PO TABS: 5.0000 mg | ORAL_TABLET | Freq: Every day | ORAL | 3 refills | Status: DC

## 2021-01-26 ENCOUNTER — Ambulatory Visit (INDEPENDENT_AMBULATORY_CARE_PROVIDER_SITE_OTHER): Payer: Medicare Other | Admitting: Physician Assistant

## 2021-01-26 ENCOUNTER — Other Ambulatory Visit: Payer: Self-pay

## 2021-01-26 ENCOUNTER — Encounter: Payer: Self-pay | Admitting: Physician Assistant

## 2021-01-26 DIAGNOSIS — E1159 Type 2 diabetes mellitus with other circulatory complications: Secondary | ICD-10-CM

## 2021-01-26 DIAGNOSIS — Z96641 Presence of right artificial hip joint: Secondary | ICD-10-CM | POA: Diagnosis not present

## 2021-01-26 DIAGNOSIS — I10 Essential (primary) hypertension: Secondary | ICD-10-CM

## 2021-01-26 DIAGNOSIS — M25551 Pain in right hip: Secondary | ICD-10-CM

## 2021-01-26 DIAGNOSIS — J449 Chronic obstructive pulmonary disease, unspecified: Secondary | ICD-10-CM

## 2021-01-26 LAB — POCT GLYCOSYLATED HEMOGLOBIN (HGB A1C): Hemoglobin A1C: 6.7 % — AB (ref 4.0–5.6)

## 2021-01-26 MED ORDER — BREO ELLIPTA 100-25 MCG/INH IN AEPB: INHALATION_SPRAY | RESPIRATORY_TRACT | 3 refills | Status: DC

## 2021-01-26 NOTE — Progress Notes (Signed)
[facility] [place], [place] [address]  Internal MEDICINE  Office Visit Note  Patient Name: Monica [name]  [id number]  [id number]  Date of Service: 01/27/2021  Chief Complaint  Patient presents with  . Follow-up  . Hypertension  . Hyperlipidemia  . Diabetes    HPI Pt is here for f/u with her daughter. -R hip is still bothersome. She had a R hip replacement last year and has since had chronic pain. She had followed up with surgeon who did procedure and was told nothing was wrong. She saw new orthopedist and they did new xray showing hip stable and in place and she was told is was just scar tissue. She is still in a lot of pain and has hard knot along R hip that is still present. She has aches going down into knee. She has difficulty sleeping because she cannot lay on that side. -BP elvated in office, on recheck was 174/82 which is slightly improved. May be elevated secondary to pain. -She is due for her A1c today and has been controlled on diet/exercise alone. -her breathing has been fine, but does need a refill on her inhaler.  [facility]: Xray hip Right 09/08/20: X-rays of the pelvis and right hip are obtained. These films demonstrate  excellent position of the femoral component which is without evidence of  loosening. The femoral head is concentrically located within the  acetabulum. No acute bony abnormalities are identified.  Current Medication: Outpatient Encounter Medications as of 01/26/2021  Medication Sig  . albuterol (VENTOLIN HFA) 108 (90 Base) MCG/ACT inhaler Inhale 2 puffs into the lungs every 4 (four) hours as needed for wheezing or shortness of breath.  Marland Kitchen amLODipine-benazepril (LOTREL) 5-20 MG capsule Take 1 capsule by mouth daily.  Marland Kitchen apixaban (ELIQUIS) 2.5 MG TABS tablet Take 1 tablet (2.5 mg total) by mouth 2 (two) times daily.  . clotrimazole-betamethasone (LOTRISONE) cream Apply 1 application topically 2 (two) times daily.  Marland Kitchen desloratadine  (CLARINEX) 5 MG tablet Take 1 tablet (5 mg total) by mouth daily.  . feeding supplement (BOOST HIGH PROTEIN) LIQD Take 1 Container by mouth as needed.  . Ferrous Sulfate (IRON) 28 MG TABS Take 1 tablet (28 mg total) by mouth daily. Or any iron supplements over-the-counter.  . gabapentin (NEURONTIN) 300 MG capsule Take 2 capsules (600 mg total) by mouth at bedtime.  Marland Kitchen glucose blood (ONETOUCH ULTRA) test strip 1 each by Other route daily. Use as instructed  . hydrochlorothiazide (HYDRODIURIL) 25 MG tablet Take 1 tablet (25 mg total) by mouth daily.  . metFORMIN (GLUCOPHAGE-XR) 500 MG 24 hr tablet Take 1 tablet (500 mg total) by mouth daily.  Marland Kitchen nystatin cream (MYCOSTATIN) Apply topically 2 (two) times daily.  Glory Rosebush Delica Lancets 60Y MISC Use as directed once daily as needed  . oxybutynin (DITROPAN-XL) 10 MG 24 hr tablet TAKE 1 TABLET BY MOUTH DAILY  . senna-docusate (SENOKOT-S) 8.6-50 MG tablet Take 1 tablet by mouth at bedtime as needed for mild constipation.  . theophylline (THEO-24) 100 MG 24 hr capsule TAKE 1 CAPSULE BY MOUTH EVERY DAY  . triamcinolone (KENALOG) 0.025 % cream Apply 1 application topically 2 (two) times daily. Ok to mix with topical lotion and apply to all affected areas.  . [DISCONTINUED] fluticasone furoate-vilanterol (BREO ELLIPTA) 100-25 MCG/INH AEPB TAKE 1 PUFF ONCE A DAY  . fluticasone furoate-vilanterol (BREO ELLIPTA) 100-25 MCG/INH AEPB TAKE 1 PUFF ONCE A DAY  . [DISCONTINUED] famotidine (PEPCID) 20 MG tablet  Take 1 tablet (20 mg total) by mouth 2 (two) times daily.  . [DISCONTINUED] pravastatin (PRAVACHOL) 40 MG tablet Take 1 tablet (40 mg total) by mouth daily.   No facility-administered encounter medications on file as of 01/26/2021.    Surgical History: Past Surgical History:  Procedure Laterality Date  . ABDOMINAL HYSTERECTOMY  1975  . BLADDER SUSPENSION  1989  . CAROTID ANGIOGRAPHY Right 11/23/2019   Procedure: CAROTID ANGIOGRAPHY;  Surgeon: Katha Cabal, MD;  Location: [facility];  Service: Cardiovascular;  Laterality: Right;  . COLONOSCOPY    . COLONOSCOPY WITH PROPOFOL N/A 01/25/2018   Procedure: COLONOSCOPY WITH PROPOFOL;  Surgeon: [name], MD;  Location: [facility] ENDOSCOPY;  Service: Endoscopy;  Laterality: N/A;  . ESOPHAGOGASTRODUODENOSCOPY (EGD) WITH PROPOFOL N/A 01/24/2018   Procedure: ESOPHAGOGASTRODUODENOSCOPY (EGD) WITH PROPOFOL;  Surgeon: [name], MD;  Location: [facility] ENDOSCOPY;  Service: Endoscopy;  Laterality: N/A;  . HIP ARTHROPLASTY Left 06/03/2019   Procedure: ARTHROPLASTY BIPOLAR HIP (HEMIARTHROPLASTY);  Surgeon: Dereck Leep, MD;  Location: [facility] ORS;  Service: Orthopedics;  Laterality: Left;  . HIP ARTHROPLASTY Right 04/21/2020   Procedure: ARTHROPLASTY BIPOLAR HIP (HEMIARTHROPLASTY);  Surgeon: Corky Mull, MD;  Location: [facility] ORS;  Service: Orthopedics;  Laterality: Right;    Medical History: Past Medical History:  Diagnosis Date  . Atrial fibrillation ([place])   . Bronchitis   . Cancer ([place])    Left leg growth, kidneys, lungs and breasts  . Carcinoma of unknown primary ([place])   . COPD (chronic obstructive pulmonary disease) ([place])   . Diabetes mellitus without complication ([place])   . Hyperlipidemia   . Hypertension   . Pancreatitis   . Pneumonia   . Stroke ([place])    TIA's  . Vitamin B12 deficiency     Family History: Family History  Problem Relation Age of Onset  . Cancer Father   . Diabetes Brother     Social History   Socioeconomic History  . Marital status: Divorced    Spouse name: Not on file  . Number of children: Not on file  . Years of education: Not on file  . Highest education level: Not on file  Occupational History  . Not on file  Tobacco Use  . Smoking status: Former Smoker    Packs/day: 0.50    Years: 15.00    Pack years: 7.50    Types: Cigarettes    Quit date: 11/15/1988    Years since quitting: 32.2  . Smokeless tobacco: Never Used  Vaping Use  . Vaping Use:  Never used  Substance and Sexual Activity  . Alcohol use: No    Alcohol/week: 0.0 standard drinks  . Drug use: No  . Sexual activity: Not on file    Comment: hysterectomy   Other Topics Concern  . Not on file  Social History Narrative  . Not on file   Social Determinants of Health   Financial Resource Strain: Not on file  Food Insecurity: Not on file  Transportation Needs: Not on file  Physical Activity: Not on file  Stress: Not on file  Social Connections: Not on file  Intimate Partner Violence: Not on file      Review of Systems  Constitutional: Negative for chills, fatigue and unexpected weight change.  HENT: Negative for congestion, postnasal drip, rhinorrhea, sneezing and sore throat.   Eyes: Negative for redness.  Respiratory: Negative for cough, chest tightness, shortness of breath and wheezing.   Cardiovascular: Negative for chest pain and  palpitations.  Gastrointestinal: Negative for abdominal pain, constipation, diarrhea, nausea and vomiting.  Genitourinary: Negative for dysuria and frequency.  Musculoskeletal: Positive for arthralgias and gait problem. Negative for back pain, joint swelling and neck pain.       Pain in R hip with knot along outside of R hip; uses walker  Skin: Negative for rash.  Neurological: Positive for weakness. Negative for tremors and numbness.  Hematological: Negative for adenopathy. Does not bruise/bleed easily.  Psychiatric/Behavioral: Positive for sleep disturbance. Negative for behavioral problems (Depression) and suicidal ideas. The patient is not nervous/anxious.     Vital Signs: BP (!) 182/87   Pulse 77   Temp 98 F (36.7 C)   Resp 16   Ht 5\' 4"  (1.626 m)   Wt 143 lb 12.8 oz (65.2 kg)   SpO2 96%   BMI 24.68 kg/m    Physical Exam Constitutional:      General: She is not in acute distress.    Appearance: She is well-developed and normal weight. She is not diaphoretic.  HENT:     Head: Normocephalic and atraumatic.      Mouth/Throat:     Pharynx: No oropharyngeal exudate.  Eyes:     Pupils: Pupils are equal, round, and reactive to light.  Neck:     Thyroid: No thyromegaly.     Vascular: No JVD.     Trachea: No tracheal deviation.  Cardiovascular:     Rate and Rhythm: Normal rate and regular rhythm.     Heart sounds: Normal heart sounds. No murmur heard. No friction rub. No gallop.   Pulmonary:     Effort: Pulmonary effort is normal. No respiratory distress.     Breath sounds: No wheezing or rales.  Chest:     Chest wall: No tenderness.  Abdominal:     General: Bowel sounds are normal.     Palpations: Abdomen is soft.  Musculoskeletal:        General: Tenderness and deformity present. Normal range of motion.     Cervical back: Normal range of motion and neck supple.     Right lower leg: No edema.     Left lower leg: No edema.     Comments: Palpable knot/protrusion underlying well healed surgical scar along R hip from hip replacement; tender to touch  Lymphadenopathy:     Cervical: No cervical adenopathy.  Skin:    General: Skin is warm and dry.     Findings: No bruising or erythema.  Neurological:     Mental Status: She is alert and oriented to person, place, and time.     Cranial Nerves: No cranial nerve deficit.     Motor: Weakness present.     Gait: Gait abnormal.     Comments: Uses walker   Psychiatric:        Behavior: Behavior normal.        Thought Content: Thought content normal.        Judgment: Judgment normal.        Assessment/Plan: 1. Type 2 diabetes mellitus with vascular disease (HCC) - POCT HgB A1c is 6.7. Pt will continue to control with diet/exercise.  2. Essential Hypertension  BP elevated in office likely secondary to pain, continue Lotrel and continue to monitor.  3. Right hip pain Xray normal will move forward with MRI due to persistent pain and concern for knot/swelling under scar. - MR HIP RIGHT W WO CONTRAST; Future  4. S/P hip replacement,  right Pain and knot  along R hip since surgery. Will obtain MRI for further evaluation. - MR HIP RIGHT W WO CONTRAST; Future  5. Chronic obstructive pulmonary disease, unspecified COPD type ([place]) Stable. Continue inhalers as prescribed. - fluticasone furoate-vilanterol (BREO ELLIPTA) 100-25 MCG/INH AEPB; TAKE 1 PUFF ONCE A DAY  Dispense: 60 each; Refill: 3  General Counseling: Atha verbalizes understanding of the findings of todays visit and agrees with plan of treatment. I have discussed any further diagnostic evaluation that may be needed or ordered today. We also reviewed her medications today. she has been encouraged to call the office with any questions or concerns that should arise related to todays visit.    Orders Placed This Encounter  Procedures  . MR HIP RIGHT W WO CONTRAST  . POCT HgB A1C    Meds ordered this encounter  Medications  . fluticasone furoate-vilanterol (BREO ELLIPTA) 100-25 MCG/INH AEPB    Sig: TAKE 1 PUFF ONCE A DAY    Dispense:  60 each    Refill:  3    This patient was seen by Drema Dallas, PA-C in collaboration with Dr. Clayborn Bigness as a part of collaborative care agreement.   Total time spent:30 Minutes Time spent includes review of chart, medications, test results, and follow up plan with the patient.      Dr Lavera Guise Internal medicine

## 2021-02-10 ENCOUNTER — Telehealth: Payer: Self-pay

## 2021-02-10 ENCOUNTER — Other Ambulatory Visit: Payer: Self-pay | Admitting: Physician Assistant

## 2021-02-10 DIAGNOSIS — R5383 Other fatigue: Secondary | ICD-10-CM

## 2021-02-10 NOTE — Telephone Encounter (Signed)
Lmom advising patient of MRI scheduled at [facility] on 03/03/21. Also advised patient will need to have labs done week prior to procedure. Loma Sousa

## 2021-02-11 ENCOUNTER — Other Ambulatory Visit: Payer: Self-pay

## 2021-02-11 DIAGNOSIS — R5383 Other fatigue: Secondary | ICD-10-CM

## 2021-02-19 ENCOUNTER — Other Ambulatory Visit: Payer: Self-pay | Admitting: Nurse Practitioner

## 2021-02-19 DIAGNOSIS — Z7901 Long term (current) use of anticoagulants: Secondary | ICD-10-CM

## 2021-02-26 ENCOUNTER — Other Ambulatory Visit
Admission: RE | Admit: 2021-02-26 | Discharge: 2021-02-26 | Disposition: A | Discharge status: Home / Self Care | Payer: Medicare Other | Attending: Physician Assistant | Admitting: Physician Assistant

## 2021-02-26 DIAGNOSIS — R5383 Other fatigue: Secondary | ICD-10-CM | POA: Diagnosis not present

## 2021-02-26 LAB — COMPREHENSIVE METABOLIC PANEL
ALT: 13 U/L (ref 0–44)
AST: 21 U/L (ref 15–41)
Albumin: 4.1 g/dL (ref 3.5–5.0)
Alkaline Phosphatase: 90 U/L (ref 38–126)
Anion gap: 9 (ref 5–15)
BUN: 17 mg/dL (ref 8–23)
CO2: 27 mmol/L (ref 22–32)
Calcium: 10 mg/dL (ref 8.9–10.3)
Chloride: 102 mmol/L (ref 98–111)
Creatinine, Ser: 0.8 mg/dL (ref 0.44–1.00)
GFR, Estimated: >60 mL/min (ref >60)
Glucose, Bld: 114 mg/dL — ABNORMAL HIGH (ref 70–99)
Potassium: 4 mmol/L (ref 3.5–5.1)
Sodium: 138 mmol/L (ref 135–145)
Total Bilirubin: 0.6 mg/dL (ref 0.3–1.2)
Total Protein: 7.5 g/dL (ref 6.5–8.1)

## 2021-03-03 ENCOUNTER — Other Ambulatory Visit: Payer: Self-pay

## 2021-03-03 ENCOUNTER — Ambulatory Visit
Admission: RE | Admit: 2021-03-03 | Discharge: 2021-03-03 | Disposition: A | Discharge status: Home / Self Care | Payer: Medicare Other | Source: Ambulatory Visit | Attending: Physician Assistant | Admitting: Physician Assistant

## 2021-03-03 DIAGNOSIS — Z96641 Presence of right artificial hip joint: Secondary | ICD-10-CM | POA: Insufficient documentation

## 2021-03-03 DIAGNOSIS — R600 Localized edema: Secondary | ICD-10-CM | POA: Diagnosis not present

## 2021-03-03 DIAGNOSIS — M25551 Pain in right hip: Secondary | ICD-10-CM | POA: Insufficient documentation

## 2021-03-03 DIAGNOSIS — M25451 Effusion, right hip: Secondary | ICD-10-CM | POA: Diagnosis not present

## 2021-03-03 MED ORDER — GADOBUTROL 1 MMOL/ML IV SOLN
6.0000 mL | Freq: Once | INTRAVENOUS | Status: AC | PRN
  Administered 2021-03-03: 6 mL via INTRAVENOUS

## 2021-03-13 ENCOUNTER — Telehealth: Payer: Self-pay

## 2021-03-13 ENCOUNTER — Other Ambulatory Visit: Payer: Self-pay | Admitting: Physician Assistant

## 2021-03-13 NOTE — Telephone Encounter (Signed)
Patient call to see if results were in from MRI, I stated to the patient the results were not reviewed yet, I inform Lauren she reviewed her results, I gave the patient a call to review her results but she did not answer the telephone LNB

## 2021-03-17 ENCOUNTER — Other Ambulatory Visit: Payer: Self-pay | Admitting: Nurse Practitioner

## 2021-03-17 ENCOUNTER — Other Ambulatory Visit: Payer: Self-pay | Admitting: Internal Medicine

## 2021-03-17 DIAGNOSIS — R350 Frequency of micturition: Secondary | ICD-10-CM

## 2021-03-17 DIAGNOSIS — Z7901 Long term (current) use of anticoagulants: Secondary | ICD-10-CM

## 2021-04-12 ENCOUNTER — Encounter: Payer: Self-pay | Admitting: Emergency Medicine

## 2021-04-12 ENCOUNTER — Inpatient Hospital Stay
Admission: EM | Admit: 2021-04-12 | Discharge: 2021-04-14 | DRG: 813 | Disposition: A | Discharge status: Home / Self Care | Payer: Medicare Other | Attending: Obstetrics and Gynecology | Admitting: Obstetrics and Gynecology

## 2021-04-12 ENCOUNTER — Other Ambulatory Visit: Payer: Self-pay

## 2021-04-12 DIAGNOSIS — J449 Chronic obstructive pulmonary disease, unspecified: Secondary | ICD-10-CM | POA: Diagnosis present

## 2021-04-12 DIAGNOSIS — Z87891 Personal history of nicotine dependence: Secondary | ICD-10-CM

## 2021-04-12 DIAGNOSIS — K922 Gastrointestinal hemorrhage, unspecified: Secondary | ICD-10-CM | POA: Diagnosis not present

## 2021-04-12 DIAGNOSIS — Z833 Family history of diabetes mellitus: Secondary | ICD-10-CM

## 2021-04-12 DIAGNOSIS — Z7901 Long term (current) use of anticoagulants: Secondary | ICD-10-CM

## 2021-04-12 DIAGNOSIS — E785 Hyperlipidemia, unspecified: Secondary | ICD-10-CM | POA: Diagnosis present

## 2021-04-12 DIAGNOSIS — Z888 Allergy status to other drugs, medicaments and biological substances status: Secondary | ICD-10-CM

## 2021-04-12 DIAGNOSIS — G2581 Restless legs syndrome: Secondary | ICD-10-CM | POA: Diagnosis not present

## 2021-04-12 DIAGNOSIS — I482 Chronic atrial fibrillation, unspecified: Secondary | ICD-10-CM | POA: Diagnosis present

## 2021-04-12 DIAGNOSIS — K625 Hemorrhage of anus and rectum: Secondary | ICD-10-CM

## 2021-04-12 DIAGNOSIS — Z7984 Long term (current) use of oral hypoglycemic drugs: Secondary | ICD-10-CM

## 2021-04-12 DIAGNOSIS — Z886 Allergy status to analgesic agent status: Secondary | ICD-10-CM

## 2021-04-12 DIAGNOSIS — D6832 Hemorrhagic disorder due to extrinsic circulating anticoagulants: Secondary | ICD-10-CM | POA: Diagnosis not present

## 2021-04-12 DIAGNOSIS — Z880 Allergy status to penicillin: Secondary | ICD-10-CM

## 2021-04-12 DIAGNOSIS — Z885 Allergy status to narcotic agent status: Secondary | ICD-10-CM

## 2021-04-12 DIAGNOSIS — K5731 Diverticulosis of large intestine without perforation or abscess with bleeding: Secondary | ICD-10-CM | POA: Diagnosis not present

## 2021-04-12 DIAGNOSIS — Z91010 Allergy to peanuts: Secondary | ICD-10-CM

## 2021-04-12 DIAGNOSIS — Z96641 Presence of right artificial hip joint: Secondary | ICD-10-CM | POA: Diagnosis present

## 2021-04-12 DIAGNOSIS — Z20822 Contact with and (suspected) exposure to covid-19: Secondary | ICD-10-CM | POA: Diagnosis present

## 2021-04-12 DIAGNOSIS — Z8673 Personal history of transient ischemic attack (TIA), and cerebral infarction without residual deficits: Secondary | ICD-10-CM

## 2021-04-12 DIAGNOSIS — K5732 Diverticulitis of large intestine without perforation or abscess without bleeding: Secondary | ICD-10-CM | POA: Diagnosis present

## 2021-04-12 DIAGNOSIS — I10 Essential (primary) hypertension: Secondary | ICD-10-CM | POA: Diagnosis present

## 2021-04-12 DIAGNOSIS — Z791 Long term (current) use of non-steroidal anti-inflammatories (NSAID): Secondary | ICD-10-CM

## 2021-04-12 DIAGNOSIS — T45515A Adverse effect of anticoagulants, initial encounter: Secondary | ICD-10-CM | POA: Diagnosis present

## 2021-04-12 DIAGNOSIS — Z882 Allergy status to sulfonamides status: Secondary | ICD-10-CM

## 2021-04-12 DIAGNOSIS — Z7951 Long term (current) use of inhaled steroids: Secondary | ICD-10-CM

## 2021-04-12 DIAGNOSIS — E119 Type 2 diabetes mellitus without complications: Secondary | ICD-10-CM | POA: Diagnosis not present

## 2021-04-12 DIAGNOSIS — Z96643 Presence of artificial hip joint, bilateral: Secondary | ICD-10-CM | POA: Diagnosis present

## 2021-04-12 LAB — CBC WITH DIFFERENTIAL/PLATELET
Abs Immature Granulocytes: 0.02 10*3/uL (ref 0.00–0.07)
Basophils Absolute: 0.1 10*3/uL (ref 0.0–0.1)
Basophils Relative: 2 %
Eosinophils Absolute: 0.2 10*3/uL (ref 0.0–0.5)
Eosinophils Relative: 4 %
HCT: 33 % — ABNORMAL LOW (ref 36.0–46.0)
Hemoglobin: 10.8 g/dL — ABNORMAL LOW (ref 12.0–15.0)
Immature Granulocytes: 0 %
Lymphocytes Relative: 25 %
Lymphs Abs: 1.6 10*3/uL (ref 0.7–4.0)
MCH: 26.7 pg (ref 26.0–34.0)
MCHC: 32.7 g/dL (ref 30.0–36.0)
MCV: 81.5 fL (ref 80.0–100.0)
Monocytes Absolute: 0.5 10*3/uL (ref 0.1–1.0)
Monocytes Relative: 7 %
Neutro Abs: 4 10*3/uL (ref 1.7–7.7)
Neutrophils Relative %: 62 %
Platelets: 258 10*3/uL (ref 150–400)
RBC: 4.05 MIL/uL (ref 3.87–5.11)
RDW: 14.6 % (ref 11.5–15.5)
WBC: 6.4 10*3/uL (ref 4.0–10.5)
nRBC: 0 % (ref 0.0–0.2)

## 2021-04-12 LAB — BASIC METABOLIC PANEL
Anion gap: 9 (ref 5–15)
BUN: 25 mg/dL — ABNORMAL HIGH (ref 8–23)
CO2: 23 mmol/L (ref 22–32)
Calcium: 9.1 mg/dL (ref 8.9–10.3)
Chloride: 107 mmol/L (ref 98–111)
Creatinine, Ser: 0.85 mg/dL (ref 0.44–1.00)
GFR, Estimated: >60 mL/min (ref >60)
Glucose, Bld: 176 mg/dL — ABNORMAL HIGH (ref 70–99)
Potassium: 4.3 mmol/L (ref 3.5–5.1)
Sodium: 139 mmol/L (ref 135–145)

## 2021-04-12 LAB — GLUCOSE, CAPILLARY
Glucose-Capillary: 181 mg/dL — ABNORMAL HIGH (ref 70–99)
Glucose-Capillary: 133 mg/dL — ABNORMAL HIGH (ref 70–99)

## 2021-04-12 LAB — PROTIME-INR
INR: 1.1 (ref 0.8–1.2)
Prothrombin Time: 14.4 seconds (ref 11.4–15.2)

## 2021-04-12 LAB — RESP PANEL BY RT-PCR (FLU A&B, COVID) ARPGX2
Influenza A by PCR: NEGATIVE
Influenza B by PCR: NEGATIVE
SARS Coronavirus 2 by RT PCR: NEGATIVE

## 2021-04-12 LAB — PREPARE RBC (CROSSMATCH)

## 2021-04-12 LAB — HEMOGLOBIN AND HEMATOCRIT, BLOOD
HCT: 30.4 % — ABNORMAL LOW (ref 36.0–46.0)
Hemoglobin: 9.7 g/dL — ABNORMAL LOW (ref 12.0–15.0)

## 2021-04-12 LAB — CBG MONITORING, ED: Glucose-Capillary: 134 mg/dL — ABNORMAL HIGH (ref 70–99)

## 2021-04-12 MED ORDER — INSULIN ASPART 100 UNIT/ML IJ SOLN
0.0000 [IU] | Freq: Three times a day (TID) | INTRAMUSCULAR | Status: DC
  Administered 2021-04-12: 2 [IU] via SUBCUTANEOUS
  Administered 2021-04-12: 3 [IU] via SUBCUTANEOUS
  Filled 2021-04-12 (×2): qty 1

## 2021-04-12 MED ORDER — GABAPENTIN 300 MG PO CAPS
600.0000 mg | ORAL_CAPSULE | Freq: Every day | ORAL | Status: DC
  Administered 2021-04-12 – 2021-04-13 (×2): 600 mg via ORAL
  Filled 2021-04-12 (×2): qty 2

## 2021-04-12 MED ORDER — ALBUTEROL SULFATE HFA 108 (90 BASE) MCG/ACT IN AERS
2.0000 | INHALATION_SPRAY | RESPIRATORY_TRACT | Status: DC | PRN
Start: 2021-04-12 — End: 2021-04-12

## 2021-04-12 MED ORDER — ONDANSETRON HCL 4 MG PO TABS: 4.0000 mg | ORAL_TABLET | Freq: Four times a day (QID) | ORAL | Status: DC | PRN

## 2021-04-12 MED ORDER — ALBUTEROL SULFATE (2.5 MG/3ML) 0.083% IN NEBU
2.5000 mg | INHALATION_SOLUTION | RESPIRATORY_TRACT | Status: DC | PRN
  Administered 2021-04-12: 2.5 mg via RESPIRATORY_TRACT
  Filled 2021-04-12: qty 3

## 2021-04-12 MED ORDER — PANTOPRAZOLE SODIUM 40 MG IV SOLR
40.0000 mg | INTRAVENOUS | Status: DC
  Administered 2021-04-12 – 2021-04-14 (×3): 40 mg via INTRAVENOUS
  Filled 2021-04-12 (×3): qty 40

## 2021-04-12 MED ORDER — LORATADINE 10 MG PO TABS
10.0000 mg | ORAL_TABLET | Freq: Every day | ORAL | Status: DC
  Administered 2021-04-12 – 2021-04-14 (×3): 10 mg via ORAL
  Filled 2021-04-12 (×3): qty 1

## 2021-04-12 MED ORDER — THEOPHYLLINE ER 100 MG PO CP24
100.0000 mg | ORAL_CAPSULE | Freq: Every day | ORAL | Status: DC
  Administered 2021-04-12 – 2021-04-14 (×3): 100 mg via ORAL
  Filled 2021-04-12 (×3): qty 1

## 2021-04-12 MED ORDER — LACTATED RINGERS IV BOLUS
500.0000 mL | Freq: Once | INTRAVENOUS | Status: AC
  Administered 2021-04-12: 500 mL via INTRAVENOUS

## 2021-04-12 MED ORDER — FLUTICASONE FUROATE-VILANTEROL 100-25 MCG/INH IN AEPB
1.0000 | INHALATION_SPRAY | Freq: Every day | RESPIRATORY_TRACT | Status: DC
  Administered 2021-04-13 – 2021-04-14 (×2): 1 via RESPIRATORY_TRACT
  Filled 2021-04-12: qty 28

## 2021-04-12 MED ORDER — ONDANSETRON HCL 4 MG/2ML IJ SOLN: 4.0000 mg | Freq: Four times a day (QID) | INTRAMUSCULAR | Status: DC | PRN

## 2021-04-12 MED ORDER — OXYBUTYNIN CHLORIDE ER 10 MG PO TB24
10.0000 mg | ORAL_TABLET | Freq: Every day | ORAL | Status: DC
  Administered 2021-04-12 – 2021-04-14 (×3): 10 mg via ORAL
  Filled 2021-04-12 (×3): qty 1

## 2021-04-12 MED ORDER — SODIUM CHLORIDE 0.9 % IV SOLN: INTRAVENOUS | Status: DC

## 2021-04-12 NOTE — ED Notes (Signed)
GI at bedside at this time.

## 2021-04-12 NOTE — H&P (Addendum)
History and Physical    Monica [name]:[id number] DOB: 1926-06-02 DOA: 04/12/2021  PCP: Mylinda Latina, PA-C   Patient coming from: Home  I have personally briefly reviewed patient's old medical records in [place]  Chief Complaint: Rectal bleeding  HPI: Monica [name] is a 87 y.o. female with medical history significant for Chronic A. fib on Eliquis, hypertension and non-insulin-dependent type 2 diabetes, who presents to the emergency room for evaluation of rectal bleeding. Patient states that she got up to use the bathroom to void when she noticed she was bleeding from her rectum.  Blood is mostly bright red with some dark blood.  She had 3 episodes at home which she describes as large volume (filled the commode) as well as, 2 more episodes in the ER.  Patient takes naproxen daily She denies having any abdominal pain, no nausea, no vomiting, no hematemesis, no chest pain, no shortness of breath, no headache, no dizziness, no lightheadedness, no urinary symptoms, no fever, no chills, no cough. Labs show sodium 139, potassium 4.3, chloride 107, bicarb 23, glucose 176, BUN 25, creatinine 0.85, calcium 9.1, white count 6.4, hemoglobin 10.8, hematocrit 33.0, MCV 81.5, RDW 14.6, platelet count 258, PT 14.4, INR 1.1 Respiratory viral panel is negative     ED Course: Patient is a 87 year old Caucasian female who presents to the ER for evaluation of multiple episodes of passage of bright red blood per rectum which is painless. She is on Eliquis as primary prophylaxis for atrial fibrillation. Hemoglobin on admission is stable at 10.8g/dl compared to baseline of 8.6g/dl, 7 months ago.  She will be referred to observation status for further evaluation. Review of Systems: As per HPI otherwise all other systems reviewed and negative.    Past Medical History:  Diagnosis Date  . Atrial fibrillation ([place])   . Bronchitis   . Cancer ([place])    Left leg growth, kidneys, lungs and breasts   . Carcinoma of unknown primary ([place])   . COPD (chronic obstructive pulmonary disease) ([place])   . Diabetes mellitus without complication ([place])   . Hyperlipidemia   . Hypertension   . Pancreatitis   . Pneumonia   . Stroke ([employer])    TIA's  . Vitamin B12 deficiency     Past Surgical History:  Procedure Laterality Date  . ABDOMINAL HYSTERECTOMY  1975  . BLADDER SUSPENSION  1989  . CAROTID ANGIOGRAPHY Right 11/23/2019   Procedure: CAROTID ANGIOGRAPHY;  Surgeon: Katha Cabal, MD;  Location: [place] [facility];  Service: Cardiovascular;  Laterality: Right;  . COLONOSCOPY    . COLONOSCOPY WITH PROPOFOL N/A 01/25/2018   Procedure: COLONOSCOPY WITH PROPOFOL;  Surgeon: [name], MD;  Location: [facility];  Service: Endoscopy;  Laterality: N/A;  . ESOPHAGOGASTRODUODENOSCOPY (EGD) WITH PROPOFOL N/A 01/24/2018   Procedure: ESOPHAGOGASTRODUODENOSCOPY (EGD) WITH PROPOFOL;  Surgeon: [name], MD;  Location: [facility] ENDOSCOPY;  Service: Endoscopy;  Laterality: N/A;  . HIP ARTHROPLASTY Left 06/03/2019   Procedure: ARTHROPLASTY BIPOLAR HIP (HEMIARTHROPLASTY);  Surgeon: Dereck Leep, MD;  Location: [facility] ORS;  Service: Orthopedics;  Laterality: Left;  . HIP ARTHROPLASTY Right 04/21/2020   Procedure: ARTHROPLASTY BIPOLAR HIP (HEMIARTHROPLASTY);  Surgeon: Corky Mull, MD;  Location: [facility] ORS;  Service: Orthopedics;  Laterality: Right;     reports that she quit smoking about 32 years ago. Her smoking use included cigarettes. She has a 7.50 pack-year smoking history. She has never used smokeless tobacco. She reports that she does not drink alcohol  and does not use drugs.  Allergies  Allergen Reactions  . Sulfa Antibiotics Swelling  . Celecoxib Nausea And Vomiting  . Acetaminophen Itching  . Codeine Rash  . Lyrica [Pregabalin] Rash  . Penicillin G Rash    Has patient had a PCN reaction causing immediate rash, facial/tongue/throat swelling, SOB or lightheadedness with hypotension: Yes Has  patient had a PCN reaction causing severe rash involving mucus membranes or skin necrosis: No Has patient had a PCN reaction that required hospitalization: No Has patient had a PCN reaction occurring within the last 10 years: Unknown If all of the above answers are "NO", then may proceed with Cephalosporin use.  Marland Kitchen Petrolatum-Zinc Oxide Rash    Family History  Problem Relation Age of Onset  . Cancer Father   . Diabetes Brother       Prior to Admission medications   Medication Sig Start Date End Date Taking? Authorizing Provider  amLODipine-benazepril (LOTREL) 5-20 MG capsule Take 1 capsule by mouth daily. 10/16/20  Yes Boscia, Greer Ee, NP  desloratadine (CLARINEX) 5 MG tablet Take 1 tablet (5 mg total) by mouth daily. 12/19/20  Yes Lavera Guise, MD  ELIQUIS 2.5 MG TABS tablet TAKE ONE (1) TABLET BY MOUTH TWO TIMES PER DAY Patient taking differently: Take 2.5 mg by mouth 2 (two) times daily. 03/17/21  Yes Lavera Guise, MD  fluticasone furoate-vilanterol (BREO ELLIPTA) 100-25 MCG/INH AEPB TAKE 1 PUFF ONCE A DAY 01/26/21  Yes McDonough, Lauren K, PA-C  gabapentin (NEURONTIN) 300 MG capsule Take 2 capsules (600 mg total) by mouth at bedtime. 10/16/20  Yes Boscia, Greer Ee, NP  metFORMIN (GLUCOPHAGE-XR) 500 MG 24 hr tablet Take 1 tablet (500 mg total) by mouth daily. 11/25/20  Yes Lavera Guise, MD  naproxen sodium (ALEVE) 220 MG tablet Take 220 mg by mouth.   Yes [provider]  oxybutynin (DITROPAN-XL) 10 MG 24 hr tablet TAKE ONE TABLET BY MOUTH EVERY DAY Patient taking differently: Take 10 mg by mouth daily after breakfast. 03/17/21  Yes Lavera Guise, MD  THEO-24 100 MG 24 hr capsule TAKE 1 CAPSULE BY MOUTH EVERY DAY Patient taking differently: Take 100 mg by mouth daily. TAKE 1 CAPSULE BY MOUTH EVERY DAY 03/17/21  Yes Lavera Guise, MD  albuterol (VENTOLIN HFA) 108 (90 Base) MCG/ACT inhaler Inhale 2 puffs into the lungs every 4 (four) hours as needed for wheezing or shortness of breath.  07/22/20   [name], NP  clotrimazole-betamethasone (LOTRISONE) cream Apply 1 application topically 2 (two) times daily. Patient not taking: Reported on 04/12/2021 10/28/20   [name], NP  feeding supplement (BOOST HIGH PROTEIN) LIQD Take 1 Container by mouth as needed.    [provider]  Ferrous Sulfate (IRON) 28 MG TABS Take 1 tablet (28 mg total) by mouth daily. Or any iron supplements over-the-counter. Patient not taking: Reported on 04/12/2021 09/10/20   Max Sane, MD  glucose blood (ONETOUCH ULTRA) test strip 1 each by Other route daily. Use as instructed 07/26/19   [name], NP  hydrochlorothiazide (HYDRODIURIL) 25 MG tablet Take 1 tablet (25 mg total) by mouth daily. Patient not taking: Reported on 04/12/2021 02/01/20   [name], NP  nystatin cream (MYCOSTATIN) Apply topically 2 (two) times daily. Patient not taking: Reported on 04/12/2021 07/18/19   [name], NP  OneTouch Delica Lancets 77O MISC Use as directed once daily as needed 09/11/20   [name], NP  senna-docusate (  SENOKOT-S) 8.6-50 MG tablet Take 1 tablet by mouth at bedtime as needed for mild constipation. 04/23/20   Enzo Bi, MD  triamcinolone (KENALOG) 0.025 % cream Apply 1 application topically 2 (two) times daily. Ok to mix with topical lotion and apply to all affected areas. Patient not taking: Reported on 04/12/2021 08/29/20   [name], NP  famotidine (PEPCID) 20 MG tablet Take 1 tablet (20 mg total) by mouth 2 (two) times daily. 08/11/16 11/28/19  Epifanio Lesches, MD  pravastatin (PRAVACHOL) 40 MG tablet Take 1 tablet (40 mg total) by mouth daily. 08/15/19 11/28/19  [name], NP    Physical Exam: Vitals:   04/12/21 0824 04/12/21 0825 04/12/21 0826 04/12/21 0830  BP: (!) 149/74   (!) 151/64  Pulse:  62 78 69  Resp:      Temp:      TempSrc:      SpO2: 95%   98%  Weight:      Height:         Vitals:   04/12/21 0824 04/12/21 0825 04/12/21  0826 04/12/21 0830  BP: (!) 149/74   (!) 151/64  Pulse:  62 78 69  Resp:      Temp:      TempSrc:      SpO2: 95%   98%  Weight:      Height:          Constitutional: Alert and oriented x 3 . Not in any apparent distress HEENT:      Head: Normocephalic and atraumatic.         Eyes: PERLA, EOMI, Conjunctivae pallor. Sclera is non-icteric.       Mouth/Throat: Mucous membranes are moist.       Neck: Supple with no signs of meningismus. Cardiovascular: Regular rate and rhythm. No murmurs, gallops, or rubs. 2+ symmetrical distal pulses are present . No JVD. No LE edema Respiratory: Respiratory effort normal .Lungs sounds clear bilaterally. No wheezes, crackles, or rhonchi.  Gastrointestinal: Soft, non tender, and non distended with positive bowel sounds.  Genitourinary: No CVA tenderness. Musculoskeletal: Nontender with normal range of motion in all extremities. No cyanosis, or erythema of extremities. Neurologic:  Face is symmetric. Moving all extremities. No gross focal neurologic deficits  Skin: Skin is warm, dry.  No rash or ulcers Psychiatric: Mood and affect are normal   Labs on Admission: I have personally reviewed following labs and imaging studies  CBC: Recent Labs  Lab 04/12/21 0815  WBC 6.4  NEUTROABS 4.0  HGB 10.8*  HCT 33.0*  MCV 81.5  PLT 798   Basic Metabolic Panel: Recent Labs  Lab 04/12/21 0815  NA 139  K 4.3  CL 107  CO2 23  GLUCOSE 176*  BUN 25*  CREATININE 0.85  CALCIUM 9.1   GFR: Estimated Creatinine Clearance: 40 mL/min (by C-G formula based on SCr of 0.85 mg/dL). Liver Function Tests: No results for input(s): AST, ALT, ALKPHOS, BILITOT, PROT, ALBUMIN in the last 168 hours. No results for input(s): LIPASE, AMYLASE in the last 168 hours. No results for input(s): AMMONIA in the last 168 hours. Coagulation Profile: Recent Labs  Lab 04/12/21 0815  INR 1.1   Cardiac Enzymes: No results for input(s): CKTOTAL, CKMB, CKMBINDEX, TROPONINI in  the last 168 hours. BNP (last 3 results) No results for input(s): PROBNP in the last 8760 hours. HbA1C: No results for input(s): HGBA1C in the last 72 hours. CBG: No results for input(s): GLUCAP in the last 168 hours.  Lipid Profile: No results for input(s): CHOL, HDL, LDLCALC, TRIG, CHOLHDL, LDLDIRECT in the last 72 hours. Thyroid Function Tests: No results for input(s): TSH, T4TOTAL, FREET4, T3FREE, THYROIDAB in the last 72 hours. Anemia Panel: No results for input(s): VITAMINB12, FOLATE, FERRITIN, TIBC, IRON, RETICCTPCT in the last 72 hours. Urine analysis:    Component Value Date/Time   COLORURINE YELLOW (A) 06/08/2019 2110   APPEARANCEUR Cloudy (A) 08/21/2019 1350   LABSPEC 1.005 06/08/2019 2110   LABSPEC 1.012 03/04/2014 1535   PHURINE 5.0 06/08/2019 2110   GLUCOSEU Negative 08/21/2019 1350   GLUCOSEU 50 mg/dL 03/04/2014 1535   HGBUR NEGATIVE 06/08/2019 2110   BILIRUBINUR neg 05/27/2020 1240   BILIRUBINUR Negative 08/21/2019 1350   BILIRUBINUR Negative 03/04/2014 1535   KETONESUR NEGATIVE 06/08/2019 2110   PROTEINUR Negative 05/27/2020 1240   PROTEINUR Negative 08/21/2019 1350   PROTEINUR NEGATIVE 06/08/2019 2110   UROBILINOGEN 0.2 05/27/2020 1240   NITRITE positive 05/27/2020 1240   NITRITE Negative 08/21/2019 1350   NITRITE NEGATIVE 06/08/2019 2110   LEUKOCYTESUR Moderate (2+) (A) 05/27/2020 1240   LEUKOCYTESUR 3+ (A) 08/21/2019 1350   LEUKOCYTESUR SMALL (A) 06/08/2019 2110   LEUKOCYTESUR 3+ 03/04/2014 1535    Radiological Exams on Admission: No results found.   Assessment/Plan Principal Problem:   Rectal bleeding Active Problems:   Diverticulitis of colon   Chronic obstructive pulmonary disease (HCC)   Atrial fibrillation, chronic (HCC)   Diabetes mellitus without complication (HCC)     Rectal bleeding most likely diverticular bleed Patient presents for evaluation of multiple episodes of painless, bright red blood per rectum. She has a history of  diverticulosis and is on chronic anticoagulation therapy with Eliquis for A. Fib.  She also takes naproxen daily Hemoglobin upon presentation was 10.8g/dl compared to baseline hemoglobin of 8.6g/dl, 7 months ago We will monitor serial H&H We will transfuse for hemoglobin of 7g/dl or less Hold Eliquis and Naproxen We will consult gastroenterology     COPD Not acutely exacerbated Continue as needed bronchodilator therapy as well as inhaled steroids    Diabetes mellitus without complication Hold metformin Glycemic control with sliding scale insulin    Hypertension Hold all antihypertensive medications for now   DVT prophylaxis: SCD Code Status: full code Family Communication:  Greater than 50% of time was spent discussing patient's condition and plan of care with her and her daughter at the bedside.  All questions and concerns have been addressed.  CODE STATUS was discussed and she is a full code.  They verbalized understanding and agree with the treatment plan. Disposition Plan: Back to previous home environment Consults called: Gastroenterology Status: Observation    Boubacar Lerette MD [facility]     04/12/2021, 10:30 AM

## 2021-04-12 NOTE — ED Notes (Signed)
Lunch tray given at this time.  

## 2021-04-12 NOTE — ED Provider Notes (Signed)
[facility] Emergency Department Provider Note   ____________________________________________   Event Date/Time   First MD Initiated Contact with Patient 04/12/21 [phone]     (approximate)  I have reviewed the triage vital signs and the nursing notes.   HISTORY  Chief Complaint Rectal Bleeding    HPI Monica [name] is a 87 y.o. female with past medical history of hypertension, hyperlipidemia, diabetes, chronic atrial fibrillation on Eliquis, who presents to the ED for GI bleeding.  Patient reports that she woke up around 630 this morning and immediately passed a significant amount of bloody stool.  She states she has had 2 further bloody bowel movements since then, denies any associated abdominal pain, nausea, or vomiting.  She has not had any lightheadedness, chest pain, or shortness of breath.  Her last dose of Eliquis was last night, she has not taken any medication or had anything to eat or drink this morning.  She has had similar issues with GI bleeding in the past requiring admission and blood transfusion.        Past Medical History:  Diagnosis Date  . Atrial fibrillation ([place])   . Bronchitis   . Cancer ([place])    Left leg growth, kidneys, lungs and breasts  . Carcinoma of unknown primary ([place])   . COPD (chronic obstructive pulmonary disease) ([place])   . Diabetes mellitus without complication ([place])   . Hyperlipidemia   . Hypertension   . Pancreatitis   . Pneumonia   . Stroke ([place])    TIA's  . Vitamin B12 deficiency     Patient Active Problem List   Diagnosis Date Noted  . Right hip pain 11/02/2020  . S/P hip replacement, right 11/02/2020  . Need for vaccination against Streptococcus pneumoniae using pneumococcal conjugate vaccine 13 11/02/2020  . Microcytic anemia   . DOE (dyspnea on exertion) 09/09/2020  . Acute on chronic anemia 09/08/2020  . Fracture of femoral neck, right ([place]) 04/19/2020  . Diabetes mellitus without complication ([place])  96/02/5408  . Fall at home, initial encounter 04/19/2020  . Supratherapeutic INR 11/22/2019  . Scalp hematoma, subsequent encounter 11/17/2019  . Scalp laceration, subsequent encounter 10/26/2019  . Muscle cramping 10/26/2019  . Long term current use of anticoagulant therapy 10/26/2019  . Encounter for general adult medical examination with abnormal findings 08/21/2019  . Cellulitis of right buttock 07/25/2019  . Diabetic neuropathy ([place]) 06/25/2019  . Type 2 diabetes mellitus with vascular disease ([place]) 06/25/2019  . Pressure injury of skin 06/05/2019  . 'light-for-dates' infant with signs of fetal malnutrition 05/31/2019  . Closed left hip fracture ([place]) 05/31/2019  . Inflammatory polyarthritis ([place]) 05/09/2019  . Deep vein thrombosis (DVT) of non-extremity vein 11/29/2018  . Edema of left lower extremity 11/13/2018  . Cellulitis of left lower leg 11/01/2018  . Encounter for therapeutic drug level monitoring 07/05/2018  . Allergic contact dermatitis 04/19/2018  . Atopic dermatitis 03/15/2018  . 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 Atrial fibrillation, chronic ([place]) 02/12/2018  . Melena   . Urinary tract infection with hematuria 01/04/2018  . Chronic obstructive pulmonary disease ([place]) 12/27/2017  . Acute cystitis with hematuria 12/27/2017  . Dysuria 12/27/2017  . Urinary frequency 12/27/2017  . Uncontrolled type 2 diabetes mellitus with hyperglycemia ([place]) 12/27/2017  . Iron deficiency anemia due to chronic blood loss 09/28/2016  . Avitaminosis D 09/09/2016  . B12 deficiency 09/09/2016  . Temporary cerebral vascular dysfunction 09/09/2016  . Spinal stenosis 09/09/2016  . Pain  in shoulder 09/09/2016  . Restless legs syndrome 09/09/2016  . Personal history of urinary calculi 09/09/2016  . H/O deep venous thrombosis 09/09/2016  . Gout 09/09/2016  . Accumulation of fluid in tissues 09/09/2016  . Carpal tunnel syndrome 09/09/2016  . Chronic lung disease 09/09/2016  .  Cataract 09/09/2016  . Appendicular ataxia 09/09/2016  . Airway hyperreactivity 09/09/2016  . Rectal bleeding   . Acute blood loss anemia   . Gastrointestinal hemorrhage 08/08/2016  . Carcinoma of unknown primary ([place]) 08/02/2016  . Diabetes ([place]) 05/08/2015  . Arthropathy of hand 02/04/2010  . 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 Detrusor instability of bladder 06/18/2009  . Pure hypercholesterolemia 04/10/2009  . Essential hypertension 04/10/2009  . Diverticulitis of colon 04/10/2009    Past Surgical History:  Procedure Laterality Date  . ABDOMINAL HYSTERECTOMY  1975  . BLADDER SUSPENSION  1989  . CAROTID ANGIOGRAPHY Right 11/23/2019   Procedure: CAROTID ANGIOGRAPHY;  Surgeon: Katha Cabal, MD;  Location: [facility];  Service: Cardiovascular;  Laterality: Right;  . COLONOSCOPY    . COLONOSCOPY WITH PROPOFOL N/A 01/25/2018   Procedure: COLONOSCOPY WITH PROPOFOL;  Surgeon: [name], MD;  Location: [facility] ENDOSCOPY;  Service: Endoscopy;  Laterality: N/A;  . ESOPHAGOGASTRODUODENOSCOPY (EGD) WITH PROPOFOL N/A 01/24/2018   Procedure: ESOPHAGOGASTRODUODENOSCOPY (EGD) WITH PROPOFOL;  Surgeon: [name], MD;  Location: [facility] ENDOSCOPY;  Service: Endoscopy;  Laterality: N/A;  . HIP ARTHROPLASTY Left 06/03/2019   Procedure: ARTHROPLASTY BIPOLAR HIP (HEMIARTHROPLASTY);  Surgeon: Dereck Leep, MD;  Location: [facility] ORS;  Service: Orthopedics;  Laterality: Left;  . HIP ARTHROPLASTY Right 04/21/2020   Procedure: ARTHROPLASTY BIPOLAR HIP (HEMIARTHROPLASTY);  Surgeon: Corky Mull, MD;  Location: [facility] ORS;  Service: Orthopedics;  Laterality: Right;    Prior to Admission medications   Medication Sig Start Date End Date Taking? Authorizing Provider  albuterol (VENTOLIN HFA) 108 (90 Base) MCG/ACT inhaler Inhale 2 puffs into the lungs every 4 (four) hours as needed for wheezing or shortness of breath. 07/22/20   [name], NP  amLODipine-benazepril (LOTREL) 5-20 MG capsule  Take 1 capsule by mouth daily. 10/16/20   [name], NP  clotrimazole-betamethasone (LOTRISONE) cream Apply 1 application topically 2 (two) times daily. 10/28/20   [name], NP  desloratadine (CLARINEX) 5 MG tablet Take 1 tablet (5 mg total) by mouth daily. 12/19/20   Lavera Guise, MD  ELIQUIS 2.5 MG TABS tablet TAKE ONE (1) TABLET BY MOUTH TWO TIMES PER DAY 03/17/21   Lavera Guise, MD  feeding supplement (BOOST HIGH PROTEIN) LIQD Take 1 Container by mouth as needed.    [provider]  Ferrous Sulfate (IRON) 28 MG TABS Take 1 tablet (28 mg total) by mouth daily. Or any iron supplements over-the-counter. 09/10/20   Max Sane, MD  fluticasone furoate-vilanterol (BREO ELLIPTA) 100-25 MCG/INH AEPB TAKE 1 PUFF ONCE A DAY 01/26/21   McDonough, Lauren K, PA-C  gabapentin (NEURONTIN) 300 MG capsule Take 2 capsules (600 mg total) by mouth at bedtime. 10/16/20   [name], NP  glucose blood (ONETOUCH ULTRA) test strip 1 each by Other route daily. Use as instructed 07/26/19   [name], NP  hydrochlorothiazide (HYDRODIURIL) 25 MG tablet Take 1 tablet (25 mg total) by mouth daily. 02/01/20   [name], NP  metFORMIN (GLUCOPHAGE-XR) 500 MG 24 hr tablet Take 1 tablet (500 mg total) by mouth daily. 11/25/20   Lavera Guise, MD  nystatin cream (MYCOSTATIN)  Apply topically 2 (two) times daily. 07/18/19   [name], NP  OneTouch Delica Lancets 50K MISC Use as directed once daily as needed 09/11/20   [name], NP  oxybutynin (DITROPAN-XL) 10 MG 24 hr tablet TAKE ONE TABLET BY MOUTH EVERY DAY 03/17/21   Lavera Guise, MD  senna-docusate (SENOKOT-S) 8.6-50 MG tablet Take 1 tablet by mouth at bedtime as needed for mild constipation. 04/23/20   Enzo Bi, MD  THEO-24 100 MG 24 hr capsule TAKE 1 CAPSULE BY MOUTH EVERY DAY 03/17/21   Lavera Guise, MD  triamcinolone (KENALOG) 0.025 % cream Apply 1 application topically 2 (two) times daily. Ok to mix with topical lotion  and apply to all affected areas. 08/29/20   [name], NP  famotidine (PEPCID) 20 MG tablet Take 1 tablet (20 mg total) by mouth 2 (two) times daily. 08/11/16 11/28/19  Epifanio Lesches, MD  pravastatin (PRAVACHOL) 40 MG tablet Take 1 tablet (40 mg total) by mouth daily. 08/15/19 11/28/19  [name], NP    Allergies Sulfa antibiotics, Celecoxib, Acetaminophen, Codeine, Lyrica [pregabalin], Penicillin g, and Petrolatum-zinc oxide  Family History  Problem Relation Age of Onset  . Cancer Father   . Diabetes Brother     Social History Social History   Tobacco Use  . Smoking status: Former Smoker    Packs/day: 0.50    Years: 15.00    Pack years: 7.50    Types: Cigarettes    Quit date: 11/15/1988    Years since quitting: 32.4  . Smokeless tobacco: Never Used  Vaping Use  . Vaping Use: Never used  Substance Use Topics  . Alcohol use: No    Alcohol/week: 0.0 standard drinks  . Drug use: No    Review of Systems  Constitutional: No fever/chills Eyes: No visual changes. ENT: No sore throat. Cardiovascular: Denies chest pain. Respiratory: Denies shortness of breath. Gastrointestinal: No abdominal pain.  No nausea, no vomiting.  No diarrhea.  No constipation.  Positive for bloody stool. Genitourinary: Negative for dysuria. Musculoskeletal: Negative for back pain. Skin: Negative for rash. Neurological: Negative for headaches, focal weakness or numbness.  ____________________________________________   PHYSICAL EXAM:  VITAL SIGNS: ED Triage Vitals  Enc Vitals Group     BP 04/12/21 0750 (!) 109/98     Pulse Rate 04/12/21 0750 90     Resp 04/12/21 0750 20     Temp 04/12/21 0750 98.4 F (36.9 C)     Temp Source 04/12/21 0750 Oral     SpO2 04/12/21 0750 96 %     Weight 04/12/21 0748 150 lb (68 kg)     Height 04/12/21 0748 5\' 4"  (1.626 m)     Head Circumference --      Peak Flow --      Pain Score 04/12/21 0748 0     Pain Loc --      Pain Edu? --       Excl. in [place]? --     Constitutional: Alert and oriented. Eyes: Conjunctivae are normal. Head: Atraumatic. Nose: No congestion/rhinnorhea. Mouth/Throat: Mucous membranes are moist. Neck: Normal ROM Cardiovascular: Normal rate, regular rhythm. Grossly normal heart sounds. Respiratory: Normal respiratory effort.  No retractions. Lungs CTAB. Gastrointestinal: Soft and nontender. No distention. Genitourinary: deferred Musculoskeletal: No lower extremity tenderness nor edema. Neurologic:  Normal speech and language. No gross focal neurologic deficits are appreciated. Skin:  Skin is warm, dry and intact. No rash noted. Psychiatric: Mood and affect are  normal. Speech and behavior are normal.  ____________________________________________   LABS (all labs ordered are listed, but only abnormal results are displayed)  Labs Reviewed  CBC WITH DIFFERENTIAL/PLATELET - Abnormal; Notable for the following components:      Result Value   Hemoglobin 10.8 (*)    HCT 33.0 (*)    All other components within normal limits  BASIC METABOLIC PANEL - Abnormal; Notable for the following components:   Glucose, Bld 176 (*)    BUN 25 (*)    All other components within normal limits  RESP PANEL BY RT-PCR (FLU A&B, COVID) ARPGX2  PROTIME-INR  TYPE AND SCREEN  PREPARE RBC (CROSSMATCH)     PROCEDURES  Procedure(s) performed (including Critical Care):  Procedures   ____________________________________________   INITIAL IMPRESSION / ASSESSMENT AND PLAN / ED COURSE       87 year old female with past medical history of hypertension, hyperlipidemia, diabetes, chronic atrial fibrillation on Eliquis, and COPD who presents to the ED complaining of 3 bloody bowel movements in the hour and a half since she got up this morning.  Patient is hemodynamically stable with no abdominal tenderness at this time.  We will check labs and hydrate with IV fluids, anticipate admission due to concern for lower GI bleed.   We will hold off on blood transfusion for now unless patient becomes hemodynamically unstable or there is significant drop in her H/H.  We will crossmatch for 2 units PRBCs to have available.  Patient had another bloody bowel movement here in the ED but remains hemodynamically stable and hemoglobin level is reassuring.  Remainder of labs are unremarkable and we will hold off on transfusion for now.  Case discussed with hospitalist for admission.      ____________________________________________   FINAL CLINICAL IMPRESSION(S) / ED DIAGNOSES  Final diagnoses:  Lower GI bleed     ED Discharge Orders    None       Note:  This document was prepared using Dragon voice recognition software and may include unintentional dictation errors.   Blake Divine, MD 04/12/21 [phone]

## 2021-04-12 NOTE — ED Notes (Signed)
Pt assisted to the bathroom 1 person assistance. Pt had a small BM with dark frank blood noted. Dr. Charna Archer, MD made aware.

## 2021-04-12 NOTE — ED Notes (Signed)
Dr. Agbata, MD at bedside.  

## 2021-04-12 NOTE — ED Notes (Signed)
Pt via POV from home. Pt is c/o of rectal bleeding since 0630 this AM. Pt describes blood as bright in nature. Pt has a hx of anemia and has had multiple blood transfusions in the past. Pt denies abd pain but states when palpated it is tender to touch. Denies NV. Pt states she did have an episode of diarrhea. Pt is on Eliquis. Pt is A&Ox4 and NAD. Pt is HOH.

## 2021-04-12 NOTE — Consult Note (Addendum)
GI Inpatient Consult Note  Reason for Consult: Painless hematochezia, LGI bleed   Attending Requesting Consult: Dr. Royce Macadamia Agbata  History of Present Illness: Monica [name] is a 87 y.o. female seen for evaluation of painless hematochezia and LGI bleed at the request of Dr. Francine Graven. Pt has a PMH of HTN, HLD, chronic atrial fibrillation on chronic anticoagulation with Eliquis, COPD, T2DM, and Hx of TIAs. She presented to the [facility] ED this morning from home for painless hematochezia. She reports she woke up around 0630 this morning and immediately passed a significant amount of bloody stool she described as red and dark. She had two further episodes since this initial BM at 0630 prior to coming to the ED. She denies any associated abdominal pain, nausea, vomiting, or fevers. She denies chest pain, shortness of breath, dizziness, or lightheadedness. Last dose of Eliquis was last night. Upon presentation to the ED this morning, she had labs showing hemoglobin 10.8, MCV 81.5, BUN 25, and creatinine 0.85. Electrolytes were within normal limits. Per chart review, hemoglobin seven months ago was 8.6. Since being in the ED she has had two further episodes of hematochezia, but patient reports it has been smaller volume each time. She denies any shortness of breath, dizziness, headache, or syncopal episodes. She reports she lives by herself and has been feeling great health wise. She does endorse taking Aleve 1 tablet night and has been doing this for a long time to help with restless leg syndrome. If she takes Gabapentin alone, it is not helpful. Taking both Gabapentin and Aleve together helps her sleep throughout the night. She reports a previous hospitalization in March 2019 for similar presentation with painless hematochezia. She had EGD and colonoscopy performed by Dr. Allen Norris which showed small-mouthed diverticulum in sigmoid colon which was actively bleeding s/p hemostatic clip for hemostasis and otherwise  unremarkable colon. Endoscopy was completely unremarkable. Patient has no other acute concerns or complaints.    Last EGD/Colonoscopy: 01/2018, see above   Past Medical History:  Past Medical History:  Diagnosis Date  . Atrial fibrillation ([place])   . Bronchitis   . Cancer ([place])    Left leg growth, kidneys, lungs and breasts  . Carcinoma of unknown primary ([place])   . COPD (chronic obstructive pulmonary disease) ([place])   . Diabetes mellitus without complication ([place])   . Hyperlipidemia   . Hypertension   . Pancreatitis   . Pneumonia   . Stroke ([place])    TIA's  . Vitamin B12 deficiency     Problem List: Patient Active Problem List   Diagnosis Date Noted  . Right hip pain 11/02/2020  . S/P hip replacement, right 11/02/2020  . Need for vaccination against Streptococcus pneumoniae using pneumococcal conjugate vaccine 13 11/02/2020  . Microcytic anemia   . DOE (dyspnea on exertion) 09/09/2020  . Acute on chronic anemia 09/08/2020  . Fracture of femoral neck, right ([place]) 04/19/2020  . Diabetes mellitus without complication ([place]) 09/73/5329  . Fall at home, initial encounter 04/19/2020  . Supratherapeutic INR 11/22/2019  . Scalp hematoma, subsequent encounter 11/17/2019  . Scalp laceration, subsequent encounter 10/26/2019  . Muscle cramping 10/26/2019  . Long term current use of anticoagulant therapy 10/26/2019  . Encounter for general adult medical examination with abnormal findings 08/21/2019  . Cellulitis of right buttock 07/25/2019  . Diabetic neuropathy ([place]) 06/25/2019  . Type 2 diabetes mellitus with vascular disease ([place]) 06/25/2019  . Pressure injury of skin 06/05/2019  . 'light-for-dates' infant with signs of fetal  malnutrition 05/31/2019  . Closed left hip fracture ([place]) 05/31/2019  . Inflammatory polyarthritis ([place]) 05/09/2019  . Deep vein thrombosis (DVT) of non-extremity vein 11/29/2018  . Edema of left lower extremity 11/13/2018  . Cellulitis of left lower leg  11/01/2018  . Encounter for therapeutic drug level monitoring 07/05/2018  . Allergic contact dermatitis 04/19/2018  . Atopic dermatitis 03/15/2018  . 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 Atrial fibrillation, chronic ([place]) 02/12/2018  . Melena   . Urinary tract infection with hematuria 01/04/2018  . Chronic obstructive pulmonary disease ([place]) 12/27/2017  . Acute cystitis with hematuria 12/27/2017  . Dysuria 12/27/2017  . Urinary frequency 12/27/2017  . Uncontrolled type 2 diabetes mellitus with hyperglycemia ([place]) 12/27/2017  . Iron deficiency anemia due to chronic blood loss 09/28/2016  . Avitaminosis D 09/09/2016  . B12 deficiency 09/09/2016  . Temporary cerebral vascular dysfunction 09/09/2016  . Spinal stenosis 09/09/2016  . Pain in shoulder 09/09/2016  . Restless legs syndrome 09/09/2016  . Personal history of urinary calculi 09/09/2016  . H/O deep venous thrombosis 09/09/2016  . Gout 09/09/2016  . Accumulation of fluid in tissues 09/09/2016  . Carpal tunnel syndrome 09/09/2016  . Chronic lung disease 09/09/2016  . Cataract 09/09/2016  . Appendicular ataxia 09/09/2016  . Airway hyperreactivity 09/09/2016  . Rectal bleeding   . Acute blood loss anemia   . Gastrointestinal hemorrhage 08/08/2016  . Carcinoma of unknown primary ([place]) 08/02/2016  . Diabetes ([place]) 05/08/2015  . Arthropathy of hand 02/04/2010  . 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 Detrusor instability of bladder 06/18/2009  . Pure hypercholesterolemia 04/10/2009  . Essential hypertension 04/10/2009  . Diverticulitis of colon 04/10/2009    Past Surgical History: Past Surgical History:  Procedure Laterality Date  . ABDOMINAL HYSTERECTOMY  1975  . BLADDER SUSPENSION  1989  . CAROTID ANGIOGRAPHY Right 11/23/2019   Procedure: CAROTID ANGIOGRAPHY;  Surgeon: Katha Cabal, MD;  Location: [facility];  Service: Cardiovascular;  Laterality: Right;  . COLONOSCOPY    . COLONOSCOPY WITH PROPOFOL  N/A 01/25/2018   Procedure: COLONOSCOPY WITH PROPOFOL;  Surgeon: [name], MD;  Location[facility] ENDOSCOPY;  Service: Endoscopy;  Laterality: N/A;  . ESOPHAGOGASTRODUODENOSCOPY (EGD) WITH PROPOFOL N/A 01/24/2018   Procedure: ESOPHAGOGASTRODUODENOSCOPY (EGD) WITH PROPOFOL;  Surgeon: [name], MD;  Location: [facility] ENDOSCOPY;  Service: Endoscopy;  Laterality: N/A;  . HIP ARTHROPLASTY Left 06/03/2019   Procedure: ARTHROPLASTY BIPOLAR HIP (HEMIARTHROPLASTY);  Surgeon: Dereck Leep, MD;  Location: [facility] ORS;  Service: Orthopedics;  Laterality: Left;  . HIP ARTHROPLASTY Right 04/21/2020   Procedure: ARTHROPLASTY BIPOLAR HIP (HEMIARTHROPLASTY);  Surgeon: Corky Mull, MD;  Location: [facility] ORS;  Service: Orthopedics;  Laterality: Right;    Allergies: Allergies  Allergen Reactions  . Sulfa Antibiotics Swelling  . Celecoxib Nausea And Vomiting  . Acetaminophen Itching  . Codeine Rash  . Lyrica [Pregabalin] Rash  . Penicillin G Rash    Has patient had a PCN reaction causing immediate rash, facial/tongue/throat swelling, SOB or lightheadedness with hypotension: Yes Has patient had a PCN reaction causing severe rash involving mucus membranes or skin necrosis: No Has patient had a PCN reaction that required hospitalization: No Has patient had a PCN reaction occurring within the last 10 years: Unknown If all of the above answers are "NO", then may proceed with Cephalosporin use.  Marland Kitchen Petrolatum-Zinc Oxide Rash    Home Medications: (Not in a hospital admission)  Home medication reconciliation was completed with the patient.  Scheduled Inpatient Medications:   . fluticasone furoate-vilanterol  1 puff Inhalation Daily  . gabapentin  600 mg Oral QHS  . insulin aspart  0-15 Units Subcutaneous TID WC  . loratadine  10 mg Oral Daily  . oxybutynin  10 mg Oral QPC breakfast  . pantoprazole (PROTONIX) IV  40 mg Intravenous Q24H  . theophylline  100 mg Oral Daily    Continuous Inpatient Infusions:    . sodium chloride      PRN Inpatient Medications:  albuterol, ondansetron **OR** ondansetron (ZOFRAN) IV  Family History: family history includes Cancer in her father; Diabetes in her brother.  The patient's family history is negative for inflammatory bowel disorders, GI malignancy, or solid organ transplantation.  Social History:   reports that she quit smoking about 32 years ago. Her smoking use included cigarettes. She has a 7.50 pack-year smoking history. She has never used smokeless tobacco. She reports that she does not drink alcohol and does not use drugs. The patient denies ETOH, tobacco, or drug use.   Review of Systems: Constitutional: Weight is stable.  Eyes: No changes in vision. ENT: No oral lesions, sore throat.  GI: see HPI.  Heme/Lymph: No easy bruising.  CV: No chest pain.  GU: No hematuria.  Integumentary: No rashes.  Neuro: No headaches.  Psych: No depression/anxiety.  Endocrine: No heat/cold intolerance.  Allergic/Immunologic: No urticaria.  Resp: No cough, SOB.  Musculoskeletal: No joint swelling.    Physical Examination: BP (!) 151/64   Pulse 69   Temp 98.4 F (36.9 C) (Oral)   Resp 20   Ht 5\' 4"  (1.626 m)   Wt 68 kg   SpO2 98%   BMI 25.75 kg/m  Elderly appearing female laying in ED stretcher. Answers all questions appropriately. Daughter at bedside.  Gen: NAD, alert and oriented x 4 HEENT: PEERLA, EOMI, Neck: supple, no JVD or thyromegaly Chest: CTA bilaterally, no wheezes, crackles, or other adventitious sounds CV: RRR, no m/g/c/r Abd: soft, nondistended, +BS in all four quadrants; mild tenderness to deep palpation in hypogastric and LLQ, no tenderness to light palpation, no HSM, guarding, ridigity, or rebound tenderness Ext: no edema, well perfused with 2+ pulses, Skin: no rash or lesions noted Lymph: no LAD  Data: Lab Results  Component Value Date   WBC 6.4 04/12/2021   HGB 10.8 (L) 04/12/2021   HCT 33.0 (L) 04/12/2021   MCV 81.5  04/12/2021   PLT 258 04/12/2021   Recent Labs  Lab 04/12/21 0815  HGB 10.8*   Lab Results  Component Value Date   NA 139 04/12/2021   K 4.3 04/12/2021   CL 107 04/12/2021   CO2 23 04/12/2021   BUN 25 (H) 04/12/2021   CREATININE 0.85 04/12/2021   Lab Results  Component Value Date   ALT 13 02/26/2021   AST 21 02/26/2021   ALKPHOS 90 02/26/2021   BILITOT 0.6 02/26/2021   Recent Labs  Lab 04/12/21 0815  INR 1.1   Assessment/Plan:  87 y/o Caucasian female with a PMH of HTN, HLD, chronic atrial fibrillation on chronic anticoagulation with Eliquis, COPD, Hx of TIAs, and T2DM presents to the [facility] ED this morning for painless hematochezia  1. Painless hematochezia/LGI Bleed - c/w diverticular bleed given her history of previous acute diverticular bleed s/p hemostatic clip placement 01/2018 for endoscopic hemostasis. Differential also includes ischemic colitis, advanced adenomas, malignancy, stercoral colitis, gastroenteritis, etc. H&H stable currently at 10.8.   2. Chronic anticoagulation with Eliquis - last dose last  night  3. Advanced age - 87 years old, overall doing well health wise  4. Hx of diverticular bleeding in sigmoid 01/2018 s/p endoscopic hemostasis with hemostatic clip placement   Recommendations:  - Continue to closely monitor serial H&H. Transfuse for Hgb <7.0.  - Maintain 2 large bore IVs - Serial abdominal examinations  - Hold Eliquis and all NSAIDs  - If further episodes of overt hematochezia, would advise tagged RBC scan to localize any active bleeding - Follow clinically. No plans for urgent endoscopic evaluation given her hemodynamic stability, but low threshold should she continue to have hematochezia - Following along with you   Thank you for the consult. Please call with questions or concerns.  Reeves Forth [facility] [phone] (Cell)

## 2021-04-12 NOTE — Progress Notes (Signed)
Pt had bowel movement with small amounts of BM, moderate amount of blood. Will continue to monitor.

## 2021-04-12 NOTE — ED Notes (Signed)
Pt had a large BM noted with dark frank blood at this time.

## 2021-04-12 NOTE — ED Triage Notes (Signed)
Pt reports when she got up this am she was bleeding from her rectum. Pt reports blood is bright red and black. Pt reports is on blood thinner, eloquis

## 2021-04-13 DIAGNOSIS — Z791 Long term (current) use of non-steroidal anti-inflammatories (NSAID): Secondary | ICD-10-CM | POA: Diagnosis not present

## 2021-04-13 DIAGNOSIS — E785 Hyperlipidemia, unspecified: Secondary | ICD-10-CM | POA: Diagnosis present

## 2021-04-13 DIAGNOSIS — G2581 Restless legs syndrome: Secondary | ICD-10-CM | POA: Diagnosis present

## 2021-04-13 DIAGNOSIS — Z8673 Personal history of transient ischemic attack (TIA), and cerebral infarction without residual deficits: Secondary | ICD-10-CM | POA: Diagnosis not present

## 2021-04-13 DIAGNOSIS — Z7901 Long term (current) use of anticoagulants: Secondary | ICD-10-CM | POA: Diagnosis not present

## 2021-04-13 DIAGNOSIS — D6832 Hemorrhagic disorder due to extrinsic circulating anticoagulants: Secondary | ICD-10-CM | POA: Diagnosis present

## 2021-04-13 DIAGNOSIS — K922 Gastrointestinal hemorrhage, unspecified: Secondary | ICD-10-CM | POA: Diagnosis not present

## 2021-04-13 DIAGNOSIS — K625 Hemorrhage of anus and rectum: Secondary | ICD-10-CM | POA: Diagnosis not present

## 2021-04-13 DIAGNOSIS — I482 Chronic atrial fibrillation, unspecified: Secondary | ICD-10-CM | POA: Diagnosis present

## 2021-04-13 DIAGNOSIS — Z87891 Personal history of nicotine dependence: Secondary | ICD-10-CM | POA: Diagnosis not present

## 2021-04-13 DIAGNOSIS — Z886 Allergy status to analgesic agent status: Secondary | ICD-10-CM | POA: Diagnosis not present

## 2021-04-13 DIAGNOSIS — J449 Chronic obstructive pulmonary disease, unspecified: Secondary | ICD-10-CM | POA: Diagnosis present

## 2021-04-13 DIAGNOSIS — E119 Type 2 diabetes mellitus without complications: Secondary | ICD-10-CM | POA: Diagnosis present

## 2021-04-13 DIAGNOSIS — Z20822 Contact with and (suspected) exposure to covid-19: Secondary | ICD-10-CM | POA: Diagnosis present

## 2021-04-13 DIAGNOSIS — Z833 Family history of diabetes mellitus: Secondary | ICD-10-CM | POA: Diagnosis not present

## 2021-04-13 DIAGNOSIS — Z96643 Presence of artificial hip joint, bilateral: Secondary | ICD-10-CM | POA: Diagnosis present

## 2021-04-13 DIAGNOSIS — T45515A Adverse effect of anticoagulants, initial encounter: Secondary | ICD-10-CM | POA: Diagnosis present

## 2021-04-13 DIAGNOSIS — Z885 Allergy status to narcotic agent status: Secondary | ICD-10-CM | POA: Diagnosis not present

## 2021-04-13 DIAGNOSIS — Z888 Allergy status to other drugs, medicaments and biological substances status: Secondary | ICD-10-CM | POA: Diagnosis not present

## 2021-04-13 DIAGNOSIS — I10 Essential (primary) hypertension: Secondary | ICD-10-CM | POA: Diagnosis present

## 2021-04-13 DIAGNOSIS — Z7984 Long term (current) use of oral hypoglycemic drugs: Secondary | ICD-10-CM | POA: Diagnosis not present

## 2021-04-13 DIAGNOSIS — K5731 Diverticulosis of large intestine without perforation or abscess with bleeding: Secondary | ICD-10-CM | POA: Diagnosis present

## 2021-04-13 DIAGNOSIS — Z7951 Long term (current) use of inhaled steroids: Secondary | ICD-10-CM | POA: Diagnosis not present

## 2021-04-13 DIAGNOSIS — Z882 Allergy status to sulfonamides status: Secondary | ICD-10-CM | POA: Diagnosis not present

## 2021-04-13 DIAGNOSIS — Z880 Allergy status to penicillin: Secondary | ICD-10-CM | POA: Diagnosis not present

## 2021-04-13 LAB — CBC
HCT: 29.8 % — ABNORMAL LOW (ref 36.0–46.0)
HCT: 32.7 % — ABNORMAL LOW (ref 36.0–46.0)
HCT: 30.6 % — ABNORMAL LOW (ref 36.0–46.0)
Hemoglobin: 9.3 g/dL — ABNORMAL LOW (ref 12.0–15.0)
Hemoglobin: 10.6 g/dL — ABNORMAL LOW (ref 12.0–15.0)
Hemoglobin: 9.9 g/dL — ABNORMAL LOW (ref 12.0–15.0)
MCH: 26.6 pg (ref 26.0–34.0)
MCH: 26.8 pg (ref 26.0–34.0)
MCH: 26.5 pg (ref 26.0–34.0)
MCHC: 31.2 g/dL (ref 30.0–36.0)
MCHC: 32.4 g/dL (ref 30.0–36.0)
MCHC: 32.4 g/dL (ref 30.0–36.0)
MCV: 85.1 fL (ref 80.0–100.0)
MCV: 82.8 fL (ref 80.0–100.0)
MCV: 81.8 fL (ref 80.0–100.0)
Platelets: 229 10*3/uL (ref 150–400)
Platelets: 251 10*3/uL (ref 150–400)
Platelets: 238 10*3/uL (ref 150–400)
RBC: 3.5 MIL/uL — ABNORMAL LOW (ref 3.87–5.11)
RBC: 3.95 MIL/uL (ref 3.87–5.11)
RBC: 3.74 MIL/uL — ABNORMAL LOW (ref 3.87–5.11)
RDW: 14.6 % (ref 11.5–15.5)
RDW: 14.4 % (ref 11.5–15.5)
RDW: 14.3 % (ref 11.5–15.5)
WBC: 6.9 10*3/uL (ref 4.0–10.5)
WBC: 8.9 10*3/uL (ref 4.0–10.5)
WBC: 9.9 10*3/uL (ref 4.0–10.5)
nRBC: 0 % (ref 0.0–0.2)
nRBC: 0 % (ref 0.0–0.2)
nRBC: 0 % (ref 0.0–0.2)

## 2021-04-13 LAB — BASIC METABOLIC PANEL
Anion gap: 6 (ref 5–15)
BUN: 20 mg/dL (ref 8–23)
CO2: 23 mmol/L (ref 22–32)
Calcium: 8.7 mg/dL — ABNORMAL LOW (ref 8.9–10.3)
Chloride: 111 mmol/L (ref 98–111)
Creatinine, Ser: 0.76 mg/dL (ref 0.44–1.00)
GFR, Estimated: >60 mL/min (ref >60)
Glucose, Bld: 147 mg/dL — ABNORMAL HIGH (ref 70–99)
Potassium: 4.1 mmol/L (ref 3.5–5.1)
Sodium: 140 mmol/L (ref 135–145)

## 2021-04-13 LAB — GLUCOSE, CAPILLARY
Glucose-Capillary: 137 mg/dL — ABNORMAL HIGH (ref 70–99)
Glucose-Capillary: 153 mg/dL — ABNORMAL HIGH (ref 70–99)

## 2021-04-13 LAB — HEMOGLOBIN AND HEMATOCRIT, BLOOD
HCT: 26.6 % — ABNORMAL LOW (ref 36.0–46.0)
Hemoglobin: 8.3 g/dL — ABNORMAL LOW (ref 12.0–15.0)

## 2021-04-13 MED ORDER — BENAZEPRIL HCL 20 MG PO TABS
20.0000 mg | ORAL_TABLET | Freq: Every day | ORAL | Status: DC
  Filled 2021-04-13 (×2): qty 1

## 2021-04-13 MED ORDER — AMLODIPINE BESYLATE 5 MG PO TABS
5.0000 mg | ORAL_TABLET | Freq: Every day | ORAL | Status: DC
  Administered 2021-04-13: 5 mg via ORAL
  Filled 2021-04-13 (×2): qty 1

## 2021-04-13 MED ORDER — METFORMIN HCL ER 500 MG PO TB24
500.0000 mg | ORAL_TABLET | Freq: Every day | ORAL | Status: DC
  Administered 2021-04-13 – 2021-04-14 (×2): 500 mg via ORAL
  Filled 2021-04-13 (×2): qty 1

## 2021-04-13 MED ORDER — HYDROCHLOROTHIAZIDE 25 MG PO TABS
25.0000 mg | ORAL_TABLET | Freq: Every day | ORAL | Status: DC
  Administered 2021-04-13: 25 mg via ORAL
  Filled 2021-04-13 (×2): qty 1

## 2021-04-13 NOTE — Progress Notes (Signed)
Patient's wife Randell Patient was notified that her husband had a change in his health status and moved to ICU.

## 2021-04-13 NOTE — Plan of Care (Signed)
  Problem: Clinical Measurements: Goal: Will remain free from infection Outcome: Progressing   Problem: Nutrition: Goal: Adequate nutrition will be maintained Outcome: Progressing   

## 2021-04-13 NOTE — Progress Notes (Signed)
GI Inpatient Follow-up Note  Subjective:  Patient seen in follow-up for lower GI bleeding. No acute events overnight. She reports 2 BMs this morning with no overt hematochezia. H&H stable since transfusion. No new complaints. She denies fever, nausea, vomiting, or abdominal pain.   Scheduled Inpatient Medications:  . fluticasone furoate-vilanterol  1 puff Inhalation Daily  . gabapentin  600 mg Oral QHS  . loratadine  10 mg Oral Daily  . metFORMIN  500 mg Oral Q breakfast  . oxybutynin  10 mg Oral QPC breakfast  . pantoprazole (PROTONIX) IV  40 mg Intravenous Q24H  . theophylline  100 mg Oral Daily    Continuous Inpatient Infusions:    PRN Inpatient Medications:  albuterol, ondansetron **OR** ondansetron (ZOFRAN) IV  Review of Systems: Constitutional: Weight is stable.  Eyes: No changes in vision. ENT: No oral lesions, sore throat.  GI: see HPI.  Heme/Lymph: No easy bruising.  CV: No chest pain.  GU: No hematuria.  Integumentary: No rashes.  Neuro: No headaches.  Psych: No depression/anxiety.  Endocrine: No heat/cold intolerance.  Allergic/Immunologic: No urticaria.  Resp: No cough, SOB.  Musculoskeletal: No joint swelling.    Physical Examination: BP (!) 155/55 (BP Location: Right Arm)   Pulse 84   Temp 97.9 F (36.6 C)   Resp 18   Ht 5\' 4"  (1.626 m)   Wt 68 kg   SpO2 99%   BMI 25.75 kg/m  Gen: NAD, alert and oriented x 4 HEENT: PEERLA, EOMI, Neck: supple, no JVD or thyromegaly Chest: CTA bilaterally, no wheezes, crackles, or other adventitious sounds CV: RRR, no m/g/c/r Abd: soft, NT, ND, +BS in all four quadrants; no HSM, guarding, ridigity, or rebound tenderness Ext: no edema, well perfused with 2+ pulses, Skin: no rash or lesions noted Lymph: no LAD  Data: Lab Results  Component Value Date   WBC 6.4 04/12/2021   HGB 8.3 (L) 04/13/2021   HCT 26.6 (L) 04/13/2021   MCV 81.5 04/12/2021   PLT 258 04/12/2021   Recent Labs  Lab 04/12/21 0815  04/12/21 1403 04/13/21 0501  HGB 10.8* 9.7* 8.3*   Lab Results  Component Value Date   NA 140 04/13/2021   K 4.1 04/13/2021   CL 111 04/13/2021   CO2 23 04/13/2021   BUN 20 04/13/2021   CREATININE 0.76 04/13/2021   Lab Results  Component Value Date   ALT 13 02/26/2021   AST 21 02/26/2021   ALKPHOS 90 02/26/2021   BILITOT 0.6 02/26/2021   Recent Labs  Lab 04/12/21 0815  INR 1.1   Assessment/Plan:  87 y/o Caucasian female with a PMH of HTN, HLD, chronic atrial fibrillation on chronic anticoagulation with Eliquis, COPD, Hx of TIAs, and T2DM presents to the [facility] ED this morning for painless hematochezia  1. Painless hematochezia/LGI Bleed - c/w diverticular bleed given her history of previous acute diverticular bleed s/p hemostatic clip placement 01/2018 for endoscopic hemostasis. Differential also includes ischemic colitis, advanced adenomas, malignancy, stercoral colitis, gastroenteritis, etc. H&H stable, 8.3 this AM but 2 BMs today with no overt hematochezia.   2. Chronic anticoagulation with Eliquis - last dose 05/28  3. Advanced age - 87 years old, overall doing well health wise  4. Hx of diverticular bleeding in sigmoid 01/2018 s/p endoscopic hemostasis with hemostatic clip placement  Recommendations:  - H&H stable with no further overt gastrointestinal blood loss. Likely a resolving diverticular bleed.  - Continue to closely monitor serial H&H. Transfuse for Hgb <7.0.  -  Continue Protonix for gastric protection - Continue to hold Eliquis and avoid all NSAIDs - Advise only expectant management. No plans for urgent endoscopic evaluation given H&H stable with no further overt  Hematochezia - If continues to remain stable overnight with no further bleeding, can be discharged in the AM   Please call with questions or concerns.    Octavia Bruckner, PA-C [facility] [phone] (Cell)

## 2021-04-13 NOTE — Progress Notes (Addendum)
PROGRESS NOTE    Monica [name]  [id number]:[id number] DOB: 1926/10/14 DOA: 04/12/2021 PCP: Mylinda Latina, PA-C     Brief Narrative:   Monica [name] is a 87 y.o. female with medical history significant forChronic A. fib on Eliquis, hypertension and non-insulin-dependent type 2 diabetes, who presents to the emergency room for evaluation of rectal bleeding. Patient states that she got up to use the bathroom to void when she noticed she was bleeding from her rectum.  Blood is mostly bright red with some dark blood.  She had 3 episodes at home which she describes as large volume (filled the commode) as well as, 2 more episodes in the ER.  Patient takes naproxen daily   Assessment & Plan:   Principal Problem:   Rectal bleeding Active Problems:   Diverticulitis of colon   Chronic obstructive pulmonary disease (HCC)   Atrial fibrillation, chronic (HCC)   Diabetes mellitus without complication ([place])   # GI bleeding One day painless hematochezia. Likely diverticular given hx diverticular bleed last in 2019. No bleeding since. Hemodynamically stable. Baseline hgb 8-9, presented with hgb of 10.8 and has dropped to 8.3 today so unclear if this represents a true drop. GI following, advising monitoring for now - cont to hold eliquis and nsaids - serial h/h - cont protonix - tagged rbc scan if continued bleeding or dropping hgb  # COPD Not acutely exacerbated - cont home breo, theophylline  # Diabetes mellitus without complication - cont home metformin - daily fasting glucose  # Hypertension Hold all antihypertensive medications for now   DVT prophylaxis: SCDs Code Status: full Family Communication: daughter updated telephonically 5/30  Level of care: Progressive Cardiac Status is: inpt  The patient will require care spanning > 2 midnights and should be moved to inpatient because: Inpatient level of care appropriate due to severity of illness  Dispo: The patient is from:  Home              Anticipated d/c is to: Home              Patient currently is not medically stable to d/c.   Difficult to place patient No   Consultants:  GI  Procedures: none  Antimicrobials:  none    Subjective: This morning feels well, no abd pain, no bleeding  Objective: Vitals:   04/12/21 2010 04/12/21 2111 04/13/21 0511 04/13/21 0826  BP: (!) 182/59  (!) 141/60 (!) 155/55  Pulse: 70 85 71 84  Resp: 14 16 14 18   Temp: 98 F (36.7 C)  97.6 F (36.4 C) 97.9 F (36.6 C)  TempSrc: Oral  Oral   SpO2: 96% 97% 92% 99%  Weight:      Height:        Intake/Output Summary (Last 24 hours) at 04/13/2021 0852 Last data filed at 04/12/2021 1700 Gross per 24 hour  Intake 920 ml  Output --  Net 920 ml   Filed Weights   04/12/21 0748  Weight: 68 kg    Examination:  General exam: Appears calm and comfortable  Respiratory system: Clear to auscultation. Respiratory effort normal. Cardiovascular system: S1 & S2 heard, RRR. No JVD, murmurs, rubs, gallops or clicks. No pedal edema. Gastrointestinal system: Abdomen is nondistended, soft and nontender. No organomegaly or masses felt. Normal bowel sounds heard. Central nervous system: Alert and oriented. No focal neurological deficits. Extremities: Symmetric 5 x 5 power. Skin: No rashes, lesions or ulcers Psychiatry: Judgement and insight appear normal. Mood &  affect appropriate.     Data Reviewed: I have personally reviewed following labs and imaging studies  CBC: Recent Labs  Lab 04/12/21 0815 04/12/21 1403 04/13/21 0501  WBC 6.4  --   --   NEUTROABS 4.0  --   --   HGB 10.8* 9.7* 8.3*  HCT 33.0* 30.4* 26.6*  MCV 81.5  --   --   PLT 258  --   --    Basic Metabolic Panel: Recent Labs  Lab 04/12/21 0815 04/13/21 0501  NA 139 140  K 4.3 4.1  CL 107 111  CO2 23 23  GLUCOSE 176* 147*  BUN 25* 20  CREATININE 0.85 0.76  CALCIUM 9.1 8.7*   GFR: Estimated Creatinine Clearance: 42.5 mL/min (by C-G formula  based on SCr of 0.76 mg/dL). Liver Function Tests: No results for input(s): AST, ALT, ALKPHOS, BILITOT, PROT, ALBUMIN in the last 168 hours. No results for input(s): LIPASE, AMYLASE in the last 168 hours. No results for input(s): AMMONIA in the last 168 hours. Coagulation Profile: Recent Labs  Lab 04/12/21 0815  INR 1.1   Cardiac Enzymes: No results for input(s): CKTOTAL, CKMB, CKMBINDEX, TROPONINI in the last 168 hours. BNP (last 3 results) No results for input(s): PROBNP in the last 8760 hours. HbA1C: No results for input(s): HGBA1C in the last 72 hours. CBG: Recent Labs  Lab 04/12/21 1249 04/12/21 1539 04/12/21 2011 04/13/21 0831  GLUCAP 134* 181* 133* 137*   Lipid Profile: No results for input(s): CHOL, HDL, LDLCALC, TRIG, CHOLHDL, LDLDIRECT in the last 72 hours. Thyroid Function Tests: No results for input(s): TSH, T4TOTAL, FREET4, T3FREE, THYROIDAB in the last 72 hours. Anemia Panel: No results for input(s): VITAMINB12, FOLATE, FERRITIN, TIBC, IRON, RETICCTPCT in the last 72 hours. Urine analysis:    Component Value Date/Time   COLORURINE YELLOW (A) 06/08/2019 2110   APPEARANCEUR Cloudy (A) 08/21/2019 1350   LABSPEC 1.005 06/08/2019 2110   LABSPEC 1.012 03/04/2014 1535   PHURINE 5.0 06/08/2019 2110   GLUCOSEU Negative 08/21/2019 1350   GLUCOSEU 50 mg/dL 03/04/2014 1535   HGBUR NEGATIVE 06/08/2019 2110   BILIRUBINUR neg 05/27/2020 1240   BILIRUBINUR Negative 08/21/2019 1350   BILIRUBINUR Negative 03/04/2014 1535   KETONESUR NEGATIVE 06/08/2019 2110   PROTEINUR Negative 05/27/2020 1240   PROTEINUR Negative 08/21/2019 1350   PROTEINUR NEGATIVE 06/08/2019 2110   UROBILINOGEN 0.2 05/27/2020 1240   NITRITE positive 05/27/2020 1240   NITRITE Negative 08/21/2019 1350   NITRITE NEGATIVE 06/08/2019 2110   LEUKOCYTESUR Moderate (2+) (A) 05/27/2020 1240   LEUKOCYTESUR 3+ (A) 08/21/2019 1350   LEUKOCYTESUR SMALL (A) 06/08/2019 2110   LEUKOCYTESUR 3+ 03/04/2014 1535    Sepsis Labs: @LABRCNTIP (procalcitonin:4,lacticidven:4)  ) Recent Results (from the past 240 hour(s))  Resp Panel by RT-PCR (Flu A&B, Covid) Nasopharyngeal Swab     Status: None   Collection Time: 04/12/21  8:15 AM   Specimen: Nasopharyngeal Swab; Nasopharyngeal(NP) swabs in vial transport medium  Result Value Ref Range Status   SARS Coronavirus 2 by RT PCR NEGATIVE NEGATIVE Final    Comment: (NOTE) SARS-CoV-2 target nucleic acids are NOT DETECTED.  The SARS-CoV-2 RNA is generally detectable in upper respiratory specimens during the acute phase of infection. The lowest concentration of SARS-CoV-2 viral copies this assay can detect is 138 copies/mL. A negative result does not preclude SARS-Cov-2 infection and should not be used as the sole basis for treatment or other patient management decisions. A negative result may occur with  improper specimen collection/handling, submission  of specimen other than nasopharyngeal swab, presence of viral mutation(s) within the areas targeted by this assay, and inadequate number of viral copies(<138 copies/mL). A negative result must be combined with clinical observations, patient history, and epidemiological information. The expected result is Negative.  Fact Sheet for Patients:  EntrepreneurPulse.com.au  Fact Sheet for Healthcare Providers:  IncredibleEmployment.be  This test is no t yet approved or cleared by the [place] FDA and  has been authorized for detection and/or diagnosis of SARS-CoV-2 by FDA under an Emergency Use Authorization (EUA). This EUA will remain  in effect (meaning this test can be used) for the duration of the COVID-19 declaration under Section 564(b)(1) of the Act, 21 U.S.C.section 360bbb-3(b)(1), unless the authorization is terminated  or revoked sooner.       Influenza A by PCR NEGATIVE NEGATIVE Final   Influenza B by PCR NEGATIVE NEGATIVE Final    Comment: (NOTE) The  Xpert Xpress SARS-CoV-2/FLU/RSV plus assay is intended as an aid in the diagnosis of influenza from Nasopharyngeal swab specimens and should not be used as a sole basis for treatment. Nasal washings and aspirates are unacceptable for Xpert Xpress SARS-CoV-2/FLU/RSV testing.  Fact Sheet for Patients: EntrepreneurPulse.com.au  Fact Sheet for Healthcare Providers: IncredibleEmployment.be  This test is not yet approved or cleared by the [place] FDA and has been authorized for detection and/or diagnosis of SARS-CoV-2 by FDA under an Emergency Use Authorization (EUA). This EUA will remain in effect (meaning this test can be used) for the duration of the COVID-19 declaration under Section 564(b)(1) of the Act, 21 U.S.C. section 360bbb-3(b)(1), unless the authorization is terminated or revoked.  Performed at [facility], [address], [place],  [address]          Radiology Studies: No results found.      Scheduled Meds: . fluticasone furoate-vilanterol  1 puff Inhalation Daily  . gabapentin  600 mg Oral QHS  . insulin aspart  0-15 Units Subcutaneous TID WC  . loratadine  10 mg Oral Daily  . oxybutynin  10 mg Oral QPC breakfast  . pantoprazole (PROTONIX) IV  40 mg Intravenous Q24H  . theophylline  100 mg Oral Daily   Continuous Infusions: . sodium chloride 100 mL/hr at 04/13/21 0132     LOS: 0 days    Time spent: 62 min    Desma Maxim, MD [facility]   If 7PM-7AM, please contact night-coverage [email or web address] Password [email or web address] 04/13/2021, 8:52 AM

## 2021-04-14 DIAGNOSIS — K625 Hemorrhage of anus and rectum: Secondary | ICD-10-CM | POA: Diagnosis not present

## 2021-04-14 LAB — HEMOGLOBIN A1C
Hgb A1c MFr Bld: 7.2 % — ABNORMAL HIGH (ref 4.8–5.6)
Mean Plasma Glucose: 160 mg/dL

## 2021-04-14 LAB — BASIC METABOLIC PANEL
Anion gap: 8 (ref 5–15)
BUN: 18 mg/dL (ref 8–23)
CO2: 25 mmol/L (ref 22–32)
Calcium: 9 mg/dL (ref 8.9–10.3)
Chloride: 105 mmol/L (ref 98–111)
Creatinine, Ser: 0.98 mg/dL (ref 0.44–1.00)
GFR, Estimated: 54 mL/min — ABNORMAL LOW (ref >60)
Glucose, Bld: 163 mg/dL — ABNORMAL HIGH (ref 70–99)
Potassium: 4.3 mmol/L (ref 3.5–5.1)
Sodium: 138 mmol/L (ref 135–145)

## 2021-04-14 LAB — CBC
HCT: 28.7 % — ABNORMAL LOW (ref 36.0–46.0)
Hemoglobin: 9.3 g/dL — ABNORMAL LOW (ref 12.0–15.0)
MCH: 26.8 pg (ref 26.0–34.0)
MCHC: 32.4 g/dL (ref 30.0–36.0)
MCV: 82.7 fL (ref 80.0–100.0)
Platelets: 245 10*3/uL (ref 150–400)
RBC: 3.47 MIL/uL — ABNORMAL LOW (ref 3.87–5.11)
RDW: 14.5 % (ref 11.5–15.5)
WBC: 7.7 10*3/uL (ref 4.0–10.5)
nRBC: 0 % (ref 0.0–0.2)

## 2021-04-14 NOTE — Progress Notes (Signed)
BP is 105/55, heart rate 78. patient scheduled to get lotensin 20mg , hctz 25mg , norvasc 5mg . Dr Si Raider said to hold all of these this am

## 2021-04-14 NOTE — Discharge Instructions (Signed)

## 2021-04-14 NOTE — Discharge Summary (Signed)
Monica [name] [id number] DOB: 18-May-1926 DOA: 04/12/2021  PCP: Monica Latina, PA-C  Admit date: 04/12/2021 Discharge date: 04/14/2021  Time spent: 35 minutes  Recommendations for Outpatient Follow-up:  1. pcp f/u 2 weeks, check hemoglobin then 2. Pt strongly advised to hold naproxen and other nsaids     Discharge Diagnoses:  Principal Problem:   Rectal bleeding Active Problems:   Diverticulitis of colon   Chronic obstructive pulmonary disease (HCC)   Atrial fibrillation, chronic (HCC)   Diabetes mellitus without complication ([place])   GI bleed   Discharge Condition: fair  Diet recommendation: regular  Filed Weights   04/12/21 0748 04/13/21 0826  Weight: 68 kg 69.4 kg    History of present illness:  Monica Cloyd Smithis a 87 y.o.femalewith medical history significantforChronic A. fib onEliquis, hypertension and non-insulin-dependent type 2 diabetes, who presents to the emergency roomfor evaluation of rectal bleeding. Patient states that she got up to use the bathroom to void when she noticed she was bleeding from her rectum. Blood is mostly bright red with some dark blood. She had 3 episodes at home which she describes as large volume (filled the commode)as well as,2 more episodes in the ER. Patient takes naproxen daily  Hospital Course:   # GI bleeding One day painless hematochezia. Likely diverticular given hx diverticular bleed last in 2019. No bleeding since. Hemodynamically stable. Baseline hgb 8-9, presented with hgb of 10.8 which dropped to 8.3 and now 9.3 meaning hgb appears to be at baseline, did not require transfusion. GI followed, advised monitoring. - discussed risk/benefit of continuing eliquis and shared decision to continue, monitor closely for bleeding - strongly advised stop naproxen - pcp f/u 2 weeks, check hgb then  Procedures:  none   Consultations:  GI  Discharge Exam: Vitals:   04/14/21 0414 04/14/21 0812  BP: (!) 120/46 (!)  105/55  Pulse: 69 78  Resp: 16 16  Temp: 98.6 F (37 C) 98.1 F (36.7 C)  SpO2: 98% 96%    General exam: Appears calm and comfortable  Respiratory system: Clear to auscultation. Respiratory effort normal. Cardiovascular system: S1 & S2 heard, RRR. No JVD, murmurs, rubs, gallops or clicks. No pedal edema. Gastrointestinal system: Abdomen is nondistended, soft and nontender. No organomegaly or masses felt. Normal bowel sounds heard. Central nervous system: Alert and oriented. No focal neurological deficits. Extremities: Symmetric 5 x 5 power. Skin: No rashes, lesions or ulcers Psychiatry: Judgement and insight appear normal. Mood & affect appropriate.   Discharge Instructions   Discharge Instructions    Diet - low sodium heart healthy   Complete by: As directed    Increase activity slowly   Complete by: As directed      Allergies as of 04/14/2021      Reactions   Sulfa Antibiotics Swelling   Celecoxib Nausea And Vomiting   Acetaminophen Itching   Codeine Rash   Lyrica [pregabalin] Rash   Penicillin G Rash   Has patient had a PCN reaction causing immediate rash, facial/tongue/throat swelling, SOB or lightheadedness with hypotension: Yes Has patient had a PCN reaction causing severe rash involving mucus membranes or skin necrosis: No Has patient had a PCN reaction that required hospitalization: No Has patient had a PCN reaction occurring within the last 10 years: Unknown If all of the above answers are "NO", then may proceed with Cephalosporin use.   Petrolatum-zinc Oxide Rash      Medication List    STOP taking these medications   naproxen sodium  220 MG tablet Commonly known as: ALEVE     TAKE these medications   albuterol 108 (90 Base) MCG/ACT inhaler Commonly known as: VENTOLIN HFA Inhale 2 puffs into the lungs every 4 (four) hours as needed for wheezing or shortness of breath.   amLODipine-benazepril 5-20 MG capsule Commonly known as: LOTREL Take 1 capsule by  mouth daily.   Breo Ellipta 100-25 MCG/INH Aepb Generic drug: fluticasone furoate-vilanterol TAKE 1 PUFF ONCE A DAY   clotrimazole-betamethasone cream Commonly known as: LOTRISONE Apply 1 application topically 2 (two) times daily.   desloratadine 5 MG tablet Commonly known as: CLARINEX Take 1 tablet (5 mg total) by mouth daily.   Eliquis 2.5 MG Tabs tablet Generic drug: apixaban TAKE ONE (1) TABLET BY MOUTH TWO TIMES PER DAY What changed: See the new instructions.   feeding supplement Liqd Take 1 Container by mouth as needed.   gabapentin 300 MG capsule Commonly known as: NEURONTIN Take 2 capsules (600 mg total) by mouth at bedtime.   hydrochlorothiazide 25 MG tablet Commonly known as: HYDRODIURIL Take 1 tablet (25 mg total) by mouth daily.   Iron 28 MG Tabs Take 1 tablet (28 mg total) by mouth daily. Or any iron supplements over-the-counter.   metFORMIN 500 MG 24 hr tablet Commonly known as: GLUCOPHAGE-XR Take 1 tablet (500 mg total) by mouth daily.   nystatin cream Commonly known as: MYCOSTATIN Apply topically 2 (two) times daily.   OneTouch Delica Lancets 58K Misc Use as directed once daily as needed   OneTouch Ultra test strip Generic drug: glucose blood 1 each by Other route daily. Use as instructed   oxybutynin 10 MG 24 hr tablet Commonly known as: DITROPAN-XL TAKE ONE TABLET BY MOUTH EVERY DAY What changed: when to take this   senna-docusate 8.6-50 MG tablet Commonly known as: Senokot-S Take 1 tablet by mouth at bedtime as needed for mild constipation.   Theo-24 100 MG 24 hr capsule Generic drug: theophylline TAKE 1 CAPSULE BY MOUTH EVERY DAY What changed:   how much to take  how to take this  when to take this   triamcinolone 0.025 % cream Commonly known as: KENALOG Apply 1 application topically 2 (two) times daily. Ok to mix with topical lotion and apply to all affected areas.      Allergies  Allergen Reactions  . Sulfa Antibiotics  Swelling  . Celecoxib Nausea And Vomiting  . Acetaminophen Itching  . Codeine Rash  . Lyrica [Pregabalin] Rash  . Penicillin G Rash    Has patient had a PCN reaction causing immediate rash, facial/tongue/throat swelling, SOB or lightheadedness with hypotension: Yes Has patient had a PCN reaction causing severe rash involving mucus membranes or skin necrosis: No Has patient had a PCN reaction that required hospitalization: No Has patient had a PCN reaction occurring within the last 10 years: Unknown If all of the above answers are "NO", then may proceed with Cephalosporin use.  Marland Kitchen Petrolatum-Zinc Oxide Rash      The results of significant diagnostics from this hospitalization (including imaging, microbiology, ancillary and laboratory) are listed below for reference.    Significant Diagnostic Studies: No results found.  Microbiology: Recent Results (from the past 240 hour(s))  Resp Panel by RT-PCR (Flu A&B, Covid) Nasopharyngeal Swab     Status: None   Collection Time: 04/12/21  8:15 AM   Specimen: Nasopharyngeal Swab; Nasopharyngeal(NP) swabs in vial transport medium  Result Value Ref Range Status   SARS Coronavirus 2 by RT PCR  NEGATIVE NEGATIVE Final    Comment: (NOTE) SARS-CoV-2 target nucleic acids are NOT DETECTED.  The SARS-CoV-2 RNA is generally detectable in upper respiratory specimens during the acute phase of infection. The lowest concentration of SARS-CoV-2 viral copies this assay can detect is 138 copies/mL. A negative result does not preclude SARS-Cov-2 infection and should not be used as the sole basis for treatment or other patient management decisions. A negative result may occur with  improper specimen collection/handling, submission of specimen other than nasopharyngeal swab, presence of viral mutation(s) within the areas targeted by this assay, and inadequate number of viral copies(<138 copies/mL). A negative result must be combined with clinical observations,  patient history, and epidemiological information. The expected result is Negative.  Fact Sheet for Patients:  EntrepreneurPulse.com.au  Fact Sheet for Healthcare Providers:  IncredibleEmployment.be  This test is no t yet approved or cleared by the [place] FDA and  has been authorized for detection and/or diagnosis of SARS-CoV-2 by FDA under an Emergency Use Authorization (EUA). This EUA will remain  in effect (meaning this test can be used) for the duration of the COVID-19 declaration under Section 564(b)(1) of the Act, 21 U.S.C.section 360bbb-3(b)(1), unless the authorization is terminated  or revoked sooner.       Influenza A by PCR NEGATIVE NEGATIVE Final   Influenza B by PCR NEGATIVE NEGATIVE Final    Comment: (NOTE) The Xpert Xpress SARS-CoV-2/FLU/RSV plus assay is intended as an aid in the diagnosis of influenza from Nasopharyngeal swab specimens and should not be used as a sole basis for treatment. Nasal washings and aspirates are unacceptable for Xpert Xpress SARS-CoV-2/FLU/RSV testing.  Fact Sheet for Patients: EntrepreneurPulse.com.au  Fact Sheet for Healthcare Providers: IncredibleEmployment.be  This test is not yet approved or cleared by the [place] FDA and has been authorized for detection and/or diagnosis of SARS-CoV-2 by FDA under an Emergency Use Authorization (EUA). This EUA will remain in effect (meaning this test can be used) for the duration of the COVID-19 declaration under Section 564(b)(1) of the Act, 21 U.S.C. section 360bbb-3(b)(1), unless the authorization is terminated or revoked.  Performed at [facility], [address] [place], [place] [address]      Labs: Basic Metabolic Panel: Recent Labs  Lab 04/12/21 0815 04/13/21 0501 04/14/21 0613  NA 139 140 138  K 4.3 4.1 4.3  CL 107 111 105  CO2 23 23 25   GLUCOSE 176* 147* 163*  BUN 25* 20 18   CREATININE 0.85 0.76 0.98  CALCIUM 9.1 8.7* 9.0   Liver Function Tests: No results for input(s): AST, ALT, ALKPHOS, BILITOT, PROT, ALBUMIN in the last 168 hours. No results for input(s): LIPASE, AMYLASE in the last 168 hours. No results for input(s): AMMONIA in the last 168 hours. CBC: Recent Labs  Lab 04/12/21 0815 04/12/21 1403 04/13/21 0501 04/13/21 1132 04/13/21 1759 04/13/21 2329 04/14/21 0613  WBC 6.4  --   --  6.9 8.9 9.9 7.7  NEUTROABS 4.0  --   --   --   --   --   --   HGB 10.8*   < > 8.3* 9.3* 10.6* 9.9* 9.3*  HCT 33.0*   < > 26.6* 29.8* 32.7* 30.6* 28.7*  MCV 81.5  --   --  85.1 82.8 81.8 82.7  PLT 258  --   --  229 251 238 245   < > = values in this interval not displayed.   Cardiac Enzymes: No results for input(s): CKTOTAL, CKMB,  CKMBINDEX, TROPONINI in the last 168 hours. BNP: BNP (last 3 results) Recent Labs    04/19/20 1252  BNP 137.9*    ProBNP (last 3 results) No results for input(s): PROBNP in the last 8760 hours.  CBG: Recent Labs  Lab 04/12/21 1249 04/12/21 1539 04/12/21 2011 04/13/21 0831 04/13/21 1137  GLUCAP 134* 181* 133* 137* 153*       Signed:  Desma Maxim MD.  [facility] 04/14/2021, 11:16 AM

## 2021-04-14 NOTE — Progress Notes (Signed)
Discharge instructions reviewed with the patient. Patient sent out via wheelchair to daughters waiting car

## 2021-04-14 NOTE — Progress Notes (Signed)
CCMD called at 0135, patient sustained HR in 40's for about 3 mins back in 60s-70s. Asymtomatic, was sleeping. BP: 110/48 MAP: 66. Dr. Damita Dunnings notified, no new orders placed at this time.

## 2021-04-15 ENCOUNTER — Telehealth: Payer: Self-pay

## 2021-04-15 NOTE — Telephone Encounter (Signed)
Paperwork to be completed brought in by [employer]. Gave to Alyssa to complete-Toni

## 2021-04-16 LAB — TYPE AND SCREEN
ABO/RH(D): B POS
Antibody Screen: NEGATIVE
Unit division: 0
Unit division: 0

## 2021-04-16 LAB — BPAM RBC
Blood Product Expiration Date: 202206192359
Blood Product Expiration Date: 202206232359
Unit Type and Rh: 7300
Unit Type and Rh: 7300

## 2021-04-20 ENCOUNTER — Other Ambulatory Visit: Payer: Self-pay | Admitting: Internal Medicine

## 2021-04-20 ENCOUNTER — Other Ambulatory Visit: Payer: Self-pay | Admitting: Nurse Practitioner

## 2021-04-20 DIAGNOSIS — L239 Allergic contact dermatitis, unspecified cause: Secondary | ICD-10-CM

## 2021-04-20 DIAGNOSIS — L209 Atopic dermatitis, unspecified: Secondary | ICD-10-CM

## 2021-04-28 ENCOUNTER — Other Ambulatory Visit: Payer: Self-pay

## 2021-04-28 ENCOUNTER — Ambulatory Visit (INDEPENDENT_AMBULATORY_CARE_PROVIDER_SITE_OTHER): Payer: Medicare Other | Admitting: Nurse Practitioner

## 2021-04-28 ENCOUNTER — Encounter: Payer: Self-pay | Admitting: Nurse Practitioner

## 2021-04-28 VITALS — BP 163/68 | HR 87 | Temp 98.7°F | Resp 16 | Ht 64.0 in | Wt 141.8 lb

## 2021-04-28 DIAGNOSIS — I10 Essential (primary) hypertension: Secondary | ICD-10-CM | POA: Diagnosis not present

## 2021-04-28 DIAGNOSIS — Z96641 Presence of right artificial hip joint: Secondary | ICD-10-CM

## 2021-04-28 DIAGNOSIS — M25551 Pain in right hip: Secondary | ICD-10-CM | POA: Diagnosis not present

## 2021-04-28 MED ORDER — MELOXICAM 7.5 MG PO TABS
7.5000 mg | ORAL_TABLET | Freq: Every day | ORAL | 0 refills | Status: DC
Start: 2021-04-28 — End: 2021-06-09

## 2021-04-28 MED ORDER — TRAMADOL HCL 50 MG PO TABS: 25.0000 mg | ORAL_TABLET | Freq: Every evening | ORAL | 0 refills | Status: AC | PRN

## 2021-04-28 NOTE — Progress Notes (Signed)
[facility] [place], [place] [address]  Internal MEDICINE  Office Visit Note  Patient Name: Monica [name]  [id number]  [id number]  Date of Service: 05/07/2021  Chief Complaint  Patient presents with   Follow-up    HTN, DM, right hip pain with scab , MRI results, med review, bp check pt takes meds at night   Cancer   Hypertension   Hyperlipidemia   Diabetes    HPI Monica [name] presents for a follow up visit to discuss hypertension, right hip pain, and MRI results.Blood pressure slighly evelated at visit today but runs sbp 140s at home per patient. The right hip pain is s/p right hip replacement surgery. There is a small scab and knot over the area. An MRI was done of the right hip that showed an area that is a probably hematoma, postoperative seroma or cyst.    Current Medication: Outpatient Encounter Medications as of 04/28/2021  Medication Sig   albuterol (VENTOLIN HFA) 108 (90 Base) MCG/ACT inhaler Inhale 2 puffs into the lungs every 4 (four) hours as needed for wheezing or shortness of breath.   amLODipine-benazepril (LOTREL) 5-20 MG capsule Take 1 capsule by mouth daily.   clotrimazole-betamethasone (LOTRISONE) cream Apply 1 application topically 2 (two) times daily.   desloratadine (CLARINEX) 5 MG tablet TAKE ONE TABLET EVERY DAY   ELIQUIS 2.5 MG TABS tablet TAKE ONE (1) TABLET BY MOUTH TWO TIMES PER DAY (Patient taking differently: Take 2.5 mg by mouth 2 (two) times daily.)   feeding supplement (BOOST HIGH PROTEIN) LIQD Take 1 Container by mouth as needed.   Ferrous Sulfate (IRON) 28 MG TABS Take 1 tablet (28 mg total) by mouth daily. Or any iron supplements over-the-counter.   fluticasone furoate-vilanterol (BREO ELLIPTA) 100-25 MCG/INH AEPB TAKE 1 PUFF ONCE A DAY   gabapentin (NEURONTIN) 300 MG capsule Take 2 capsules (600 mg total) by mouth at bedtime.   glucose blood (ONETOUCH ULTRA) test strip 1 each by Other route daily. Use as instructed    hydrochlorothiazide (HYDRODIURIL) 25 MG tablet Take 1 tablet (25 mg total) by mouth daily.   meloxicam (MOBIC) 7.5 MG tablet Take 1 tablet (7.5 mg total) by mouth daily.   metFORMIN (GLUCOPHAGE-XR) 500 MG 24 hr tablet Take 1 tablet (500 mg total) by mouth daily.   nystatin cream (MYCOSTATIN) Apply topically 2 (two) times daily.   OneTouch Delica Lancets 20U MISC Use as directed once daily as needed   oxybutynin (DITROPAN-XL) 10 MG 24 hr tablet TAKE ONE TABLET BY MOUTH EVERY DAY (Patient taking differently: Take 10 mg by mouth daily after breakfast.)   senna-docusate (SENOKOT-S) 8.6-50 MG tablet Take 1 tablet by mouth at bedtime as needed for mild constipation.   THEO-24 100 MG 24 hr capsule TAKE 1 CAPSULE BY MOUTH EVERY DAY (Patient taking differently: Take 100 mg by mouth daily. TAKE 1 CAPSULE BY MOUTH EVERY DAY)   [EXPIRED] traMADol (ULTRAM) 50 MG tablet Take 0.5 tablets (25 mg total) by mouth at bedtime as needed for up to 7 days for moderate pain or severe pain.   triamcinolone (KENALOG) 0.025 % cream Apply 1 application topically 2 (two) times daily. Ok to mix with topical lotion and apply to all affected areas.   [DISCONTINUED] famotidine (PEPCID) 20 MG tablet Take 1 tablet (20 mg total) by mouth 2 (two) times daily.   [DISCONTINUED] pravastatin (PRAVACHOL) 40 MG tablet Take 1 tablet (40 mg total) by mouth daily.   No facility-administered encounter medications  on file as of 04/28/2021.    Surgical History: Past Surgical History:  Procedure Laterality Date   [place]   CAROTID ANGIOGRAPHY Right 11/23/2019   Procedure: CAROTID ANGIOGRAPHY;  Surgeon: Katha Cabal, MD;  Location: [place];  Service: Cardiovascular;  Laterality: Right;   COLONOSCOPY     COLONOSCOPY WITH PROPOFOL N/A 01/25/2018   Procedure: COLONOSCOPY WITH PROPOFOL;  Surgeon: [name], MD;  Location: [facility] ENDOSCOPY;  Service: Endoscopy;  Laterality: N/A;    ESOPHAGOGASTRODUODENOSCOPY (EGD) WITH PROPOFOL N/A 01/24/2018   Procedure: ESOPHAGOGASTRODUODENOSCOPY (EGD) WITH PROPOFOL;  Surgeon: [name], MD;  Location: [facility] ENDOSCOPY;  Service: Endoscopy;  Laterality: N/A;   HIP ARTHROPLASTY Left 06/03/2019   Procedure: ARTHROPLASTY BIPOLAR HIP (HEMIARTHROPLASTY);  Surgeon: Dereck Leep, MD;  Location: [facility] ORS;  Service: Orthopedics;  Laterality: Left;   HIP ARTHROPLASTY Right 04/21/2020   Procedure: ARTHROPLASTY BIPOLAR HIP (HEMIARTHROPLASTY);  Surgeon: Corky Mull, MD;  Location: [facility] ORS;  Service: Orthopedics;  Laterality: Right;    Medical History: Past Medical History:  Diagnosis Date   Atrial fibrillation (HCC)    Bronchitis    Cancer (HCC)    Left leg growth, kidneys, lungs and breasts   Carcinoma of unknown primary ([place])    COPD (chronic obstructive pulmonary disease) ([place])    Diabetes mellitus without complication ([place])    Hyperlipidemia    Hypertension    Pancreatitis    Pneumonia    Stroke ([place])    TIA's   Vitamin B12 deficiency     Family History: Family History  Problem Relation Age of Onset   Cancer Father    Diabetes Brother     Social History   Socioeconomic History   Marital status: Divorced    Spouse name: Not on file   Number of children: Not on file   Years of education: Not on file   Highest education level: Not on file  Occupational History   Not on file  Tobacco Use   Smoking status: Former    Packs/day: 0.50    Years: 15.00    Pack years: 7.50    Types: Cigarettes    Quit date: 11/15/1988    Years since quitting: 32.4   Smokeless tobacco: Never  Vaping Use   Vaping Use: Never used  Substance and Sexual Activity   Alcohol use: No    Alcohol/week: 0.0 standard drinks   Drug use: No   Sexual activity: Not on file    Comment: hysterectomy   Other Topics Concern   Not on file  Social History Narrative   Not on file   Social Determinants of Health   Financial Resource Strain: Not on  file  Food Insecurity: Not on file  Transportation Needs: Not on file  Physical Activity: Not on file  Stress: Not on file  Social Connections: Not on file  Intimate Partner Violence: Not on file      Review of Systems  Constitutional:  Negative for chills, fatigue and unexpected weight change.  HENT:  Negative for congestion, rhinorrhea, sneezing and sore throat.   Eyes:  Negative for redness.  Respiratory:  Negative for cough, chest tightness and shortness of breath.   Cardiovascular:  Negative for chest pain and palpitations.  Gastrointestinal:  Negative for abdominal pain, constipation, diarrhea, nausea and vomiting.  Genitourinary:  Negative for dysuria and frequency.  Musculoskeletal:  Positive for arthralgias. Negative for back pain, joint swelling and neck  pain.       Hip pain, right, hurts to sleep on it.   Skin:  Negative for rash.  Neurological: Negative.  Negative for tremors and numbness.  Hematological:  Negative for adenopathy. Does not bruise/bleed easily.  Psychiatric/Behavioral:  Negative for behavioral problems (Depression), sleep disturbance and suicidal ideas. The patient is not nervous/anxious.    Vital Signs: BP (!) 163/68   Pulse 87   Temp 98.7 F (37.1 C)   Resp 16   Ht 5\' 4"  (1.626 m)   Wt 141 lb 12.8 oz (64.3 kg)   SpO2 96%   BMI 24.34 kg/m    Physical Exam Vitals reviewed.  Constitutional:      General: She is not in acute distress.    Appearance: Normal appearance. She is well-developed and normal weight. She is not ill-appearing or diaphoretic.  HENT:     Head: Normocephalic and atraumatic.  Neck:     Thyroid: No thyromegaly.     Vascular: No JVD.     Trachea: No tracheal deviation.  Cardiovascular:     Rate and Rhythm: Normal rate and regular rhythm.     Pulses: Normal pulses.     Heart sounds: Normal heart sounds. No murmur heard.   No friction rub. No gallop.  Pulmonary:     Effort: Pulmonary effort is normal. No respiratory  distress.     Breath sounds: Normal breath sounds. No wheezing or rales.  Chest:     Chest wall: No tenderness.  Musculoskeletal:     Cervical back: Normal range of motion and neck supple.     Right hip: Tenderness present. Decreased range of motion.     Comments: Scab and knot over right hip  Lymphadenopathy:     Cervical: No cervical adenopathy.  Skin:    General: Skin is warm and dry.  Neurological:     Mental Status: She is alert and oriented to person, place, and time.     Cranial Nerves: No cranial nerve deficit.  Psychiatric:        Behavior: Behavior normal.        Thought Content: Thought content normal.        Judgment: Judgment normal.    Assessment/Plan: 1. Essential hypertension Blood pressure is elevated at today's visit. Patient reports that she takes her medications at night because that is when her doctor that started her on blood pressure medication told her too. She insisted that her blood pressure is only high when she is at the doctor's office. When attempting to discuss any augmentation or change in medication, dose, or time of day she takes the medication, she refused and insisted that it is fine the way it is.   2. Right hip pain Patient has been experiencing significant right hip pain since her hip replacement approximately 1 year ago. There is a small quarter size scab that has not healed and there is a tender knot beneath it. MRI showed a possible hematoma, seroma or cyst and recommended treatment is surgical insertion, drainage and removal which may not be an appropriate option due to the patient's age. A referral to a different orthopedic was also an option that was discussed. She declined any referral to a specialist. Prescribing an antiinflammatory medication as well as something as needed for pain was discussed and she was open to this option. Meloxicam and tramadol were prescribed. Physical therapy was also discussed but she declined at this time.  -  meloxicam (MOBIC) 7.5 MG tablet;  Take 1 tablet (7.5 mg total) by mouth daily.  Dispense: 30 tablet; Refill: 0 - traMADol (ULTRAM) 50 MG tablet; Take 0.5 tablets (25 mg total) by mouth at bedtime as needed for up to 7 days for moderate pain or severe pain.  Dispense: 3.5 tablet; Refill: 0  3. S/P hip replacement, right Per MRI, she has developed an area that could be a hematoma or postoperative seroma. She has seen her surgeon multiple times about it and he has not done anything for it per patient. Offered to send the patient back to the surgeon or send a referral to a different orthopedic surgeon but she declined.    General Counseling: Han verbalizes understanding of the findings of todays visit and agrees with plan of treatment. I have discussed any further diagnostic evaluation that may be needed or ordered today. We also reviewed her medications today. she has been encouraged to call the office with any questions or concerns that should arise related to todays visit.    No orders of the defined types were placed in this encounter.   Meds ordered this encounter  Medications   meloxicam (MOBIC) 7.5 MG tablet    Sig: Take 1 tablet (7.5 mg total) by mouth daily.    Dispense:  30 tablet    Refill:  0   traMADol (ULTRAM) 50 MG tablet    Sig: Take 0.5 tablets (25 mg total) by mouth at bedtime as needed for up to 7 days for moderate pain or severe pain.    Dispense:  3.5 tablet    Refill:  0    Order Specific Question:   Supervising Provider    Answer:   Lavera Guise [[id number]]    Return in about 4 weeks (around 05/26/2021) for F/U hip pain and med refill lauren PA.   Total time spent:30 Minutes Time spent includes review of chart, medications, test results, and follow up plan with the patient.   [place] Controlled Substance Database was reviewed by me.  This patient was seen by Jonetta Osgood, FNP-C in collaboration with Dr. Clayborn Bigness as a part of collaborative care  agreement.   Holley Wirt R. Valetta Fuller, MSN, FNP-C Internal medicine

## 2021-05-14 DIAGNOSIS — Z23 Encounter for immunization: Secondary | ICD-10-CM | POA: Diagnosis not present

## 2021-05-15 ENCOUNTER — Other Ambulatory Visit: Payer: Self-pay | Admitting: Internal Medicine

## 2021-06-09 ENCOUNTER — Other Ambulatory Visit: Payer: Self-pay

## 2021-06-09 ENCOUNTER — Encounter: Payer: Self-pay | Admitting: Nurse Practitioner

## 2021-06-09 ENCOUNTER — Ambulatory Visit (INDEPENDENT_AMBULATORY_CARE_PROVIDER_SITE_OTHER): Payer: Medicare Other | Admitting: Nurse Practitioner

## 2021-06-09 VITALS — BP 138/65 | HR 80 | Temp 98.5°F | Resp 16 | Ht 64.0 in | Wt 140.0 lb

## 2021-06-09 DIAGNOSIS — Z96641 Presence of right artificial hip joint: Secondary | ICD-10-CM

## 2021-06-09 DIAGNOSIS — I10 Essential (primary) hypertension: Secondary | ICD-10-CM

## 2021-06-09 DIAGNOSIS — M25551 Pain in right hip: Secondary | ICD-10-CM

## 2021-06-09 MED ORDER — MELOXICAM 7.5 MG PO TABS
7.5000 mg | ORAL_TABLET | Freq: Every day | ORAL | 0 refills | Status: DC
Start: 2021-06-09 — End: 2021-07-07

## 2021-06-09 NOTE — Progress Notes (Signed)
[facility] [place], [place] [address]  Internal MEDICINE  Office Visit Note  Patient Name: Monica [name]  [id number]  [id number]  Date of Service: 06/09/2021  Chief Complaint  Patient presents with   Follow-up    Hip pain, med refills, patient has a cough, patient had cough for a few days   Diabetes   Cancer   COPD    HPI Brystol presents for a follow up visit for right hip pain. At her last office visit, meloxicam and tramadol were prescribed for the right hip pain. Her pharmacist told her that both of those medications interacted with her current medications and so she did not take either medication nor did she call the clinic to say she was not going to take either medication. Patient has been taking OTC naproxen, discussed naproxen and meloxicam and the risks and benefits of switching from naproxen to meloxicam.    Current Medication: Outpatient Encounter Medications as of 06/09/2021  Medication Sig   albuterol (VENTOLIN HFA) 108 (90 Base) MCG/ACT inhaler Inhale 2 puffs into the lungs every 4 (four) hours as needed for wheezing or shortness of breath.   amLODipine-benazepril (LOTREL) 5-20 MG capsule TAKE 1 CAPSULE BY MOUTH ONCE DAILY   clotrimazole-betamethasone (LOTRISONE) cream Apply 1 application topically 2 (two) times daily.   desloratadine (CLARINEX) 5 MG tablet TAKE ONE TABLET EVERY DAY   feeding supplement (BOOST HIGH PROTEIN) LIQD Take 1 Container by mouth as needed.   Ferrous Sulfate (IRON) 28 MG TABS Take 1 tablet (28 mg total) by mouth daily. Or any iron supplements over-the-counter.   fluticasone furoate-vilanterol (BREO ELLIPTA) 100-25 MCG/INH AEPB TAKE 1 PUFF ONCE A DAY   gabapentin (NEURONTIN) 300 MG capsule TAKE 2 CAPSULES BY MOUTH AT BEDTIME   glucose blood (ONETOUCH ULTRA) test strip 1 each by Other route daily. Use as instructed   hydrochlorothiazide (HYDRODIURIL) 25 MG tablet Take 1 tablet (25 mg total) by mouth daily.   metFORMIN  (GLUCOPHAGE-XR) 500 MG 24 hr tablet Take 1 tablet (500 mg total) by mouth daily.   nystatin cream (MYCOSTATIN) Apply topically 2 (two) times daily.   OneTouch Delica Lancets 99991111 MISC Use as directed once daily as needed   oxybutynin (DITROPAN-XL) 10 MG 24 hr tablet TAKE ONE TABLET BY MOUTH EVERY DAY (Patient taking differently: Take 10 mg by mouth daily after breakfast.)   senna-docusate (SENOKOT-S) 8.6-50 MG tablet Take 1 tablet by mouth at bedtime as needed for mild constipation.   THEO-24 100 MG 24 hr capsule TAKE 1 CAPSULE BY MOUTH EVERY DAY (Patient taking differently: Take 100 mg by mouth daily. TAKE 1 CAPSULE BY MOUTH EVERY DAY)   triamcinolone (KENALOG) 0.025 % cream Apply 1 application topically 2 (two) times daily. Ok to mix with topical lotion and apply to all affected areas.   [DISCONTINUED] ELIQUIS 2.5 MG TABS tablet TAKE ONE (1) TABLET BY MOUTH TWO TIMES PER DAY (Patient taking differently: Take 2.5 mg by mouth 2 (two) times daily.)   [DISCONTINUED] meloxicam (MOBIC) 7.5 MG tablet Take 1 tablet (7.5 mg total) by mouth daily.   meloxicam (MOBIC) 7.5 MG tablet Take 1 tablet (7.5 mg total) by mouth daily.   [DISCONTINUED] famotidine (PEPCID) 20 MG tablet Take 1 tablet (20 mg total) by mouth 2 (two) times daily.   [DISCONTINUED] pravastatin (PRAVACHOL) 40 MG tablet Take 1 tablet (40 mg total) by mouth daily.   No facility-administered encounter medications on file as of 06/09/2021.  Surgical History: Past Surgical History:  Procedure Laterality Date   [place]   CAROTID ANGIOGRAPHY Right 11/23/2019   Procedure: CAROTID ANGIOGRAPHY;  Surgeon: Katha Cabal, MD;  Location: [facility];  Service: Cardiovascular;  Laterality: Right;   COLONOSCOPY     COLONOSCOPY WITH PROPOFOL N/A 01/25/2018   Procedure: COLONOSCOPY WITH PROPOFOL;  Surgeon: [name], MD;  Location: [facility] ENDOSCOPY;  Service: Endoscopy;  Laterality: N/A;    ESOPHAGOGASTRODUODENOSCOPY (EGD) WITH PROPOFOL N/A 01/24/2018   Procedure: ESOPHAGOGASTRODUODENOSCOPY (EGD) WITH PROPOFOL;  Surgeon: [name], MD;  Location[facility] ENDOSCOPY;  Service: Endoscopy;  Laterality: N/A;   HIP ARTHROPLASTY Left 06/03/2019   Procedure: ARTHROPLASTY BIPOLAR HIP (HEMIARTHROPLASTY);  Surgeon: Dereck Leep, MD;  Location: [facility] ORS;  Service: Orthopedics;  Laterality: Left;   HIP ARTHROPLASTY Right 04/21/2020   Procedure: ARTHROPLASTY BIPOLAR HIP (HEMIARTHROPLASTY);  Surgeon: Corky Mull, MD;  Location: [facility] ORS;  Service: Orthopedics;  Laterality: Right;    Medical History: Past Medical History:  Diagnosis Date   Atrial fibrillation (HCC)    Bronchitis    Cancer (HCC)    Left leg growth, kidneys, lungs and breasts   Carcinoma of unknown primary ([place])    COPD (chronic obstructive pulmonary disease) ([place])    Diabetes mellitus without complication ([place])    Hyperlipidemia    Hypertension    Pancreatitis    Pneumonia    Stroke ([place])    TIA's   Vitamin B12 deficiency     Family History: Family History  Problem Relation Age of Onset   Cancer Father    Diabetes Brother     Social History   Socioeconomic History   Marital status: Divorced    Spouse name: Not on file   Number of children: Not on file   Years of education: Not on file   Highest education level: Not on file  Occupational History   Not on file  Tobacco Use   Smoking status: Former    Packs/day: 0.50    Years: 15.00    Pack years: 7.50    Types: Cigarettes    Quit date: 11/15/1988    Years since quitting: 32.6   Smokeless tobacco: Never  Vaping Use   Vaping Use: Never used  Substance and Sexual Activity   Alcohol use: No    Alcohol/week: 0.0 standard drinks   Drug use: No   Sexual activity: Not on file    Comment: hysterectomy   Other Topics Concern   Not on file  Social History Narrative   Not on file   Social Determinants of Health   Financial Resource Strain: Not on  file  Food Insecurity: Not on file  Transportation Needs: Not on file  Physical Activity: Not on file  Stress: Not on file  Social Connections: Not on file  Intimate Partner Violence: Not on file      Review of Systems  Constitutional:  Negative for chills, fatigue and unexpected weight change.  HENT:  Negative for congestion, rhinorrhea, sneezing and sore throat.   Eyes:  Negative for redness.  Respiratory:  Negative for cough, chest tightness and shortness of breath.   Cardiovascular:  Negative for chest pain and palpitations.  Gastrointestinal:  Negative for abdominal pain, constipation, diarrhea, nausea and vomiting.  Genitourinary:  Negative for dysuria and frequency.  Musculoskeletal:  Positive for arthralgias. Negative for back pain, joint swelling and neck pain.       Hip  pain, right, hurts to sleep on it.   Skin:  Negative for rash.  Neurological: Negative.  Negative for tremors and numbness.  Hematological:  Negative for adenopathy. Does not bruise/bleed easily.  Psychiatric/Behavioral:  Negative for behavioral problems (Depression), sleep disturbance and suicidal ideas. The patient is not nervous/anxious.    Vital Signs: BP (!) 142/68   Pulse 80   Temp 98.5 F (36.9 C)   Resp 16   Ht '5\' 4"'$  (1.626 m)   Wt 140 lb (63.5 kg)   SpO2 98%   BMI 24.03 kg/m    Physical Exam Vitals reviewed.  Constitutional:      General: She is not in acute distress.    Appearance: Normal appearance. She is well-developed and normal weight. She is not ill-appearing or diaphoretic.  HENT:     Head: Normocephalic and atraumatic.  Neck:     Thyroid: No thyromegaly.     Vascular: No JVD.     Trachea: No tracheal deviation.  Cardiovascular:     Rate and Rhythm: Normal rate and regular rhythm.     Pulses: Normal pulses.     Heart sounds: Normal heart sounds. No murmur heard.   No friction rub. No gallop.  Pulmonary:     Effort: Pulmonary effort is normal. No respiratory distress.      Breath sounds: Normal breath sounds. No wheezing or rales.  Chest:     Chest wall: No tenderness.  Musculoskeletal:     Cervical back: Normal range of motion and neck supple.     Right hip: Tenderness present. Decreased range of motion.     Comments: Scab and knot over right hip  Lymphadenopathy:     Cervical: No cervical adenopathy.  Skin:    General: Skin is warm and dry.     Capillary Refill: Capillary refill takes less than 2 seconds.  Neurological:     Mental Status: She is alert and oriented to person, place, and time.     Cranial Nerves: No cranial nerve deficit.  Psychiatric:        Mood and Affect: Mood normal.        Behavior: Behavior normal.    Assessment/Plan: 1. Right hip pain Reordered meloxicam for patient to pick up and added a note to pharmacy that the risks, benefits and possible interactions were discussed. She will try the meloxicam instead of the naproxen for pain  - meloxicam (MOBIC) 7.5 MG tablet; Take 1 tablet (7.5 mg total) by mouth daily.  Dispense: 30 tablet; Refill: 0  2. S/P hip replacement, right Discussed the MRI on the previous visit that there is a possible pocket of fluid that could be a hematoma or postoperative seroma. She is aware that this may resolve on it's own. She is not interested in seeing the surgeon who did the procedure again, nor is she interested in a referral to a different orthopedic specialist. She is also not interested in having the pocket of fluid incised and drained.  `  3. Essential hypertension Stable on current medications, no changes at this time, no refills needed.    General Counseling: Kacia verbalizes understanding of the findings of todays visit and agrees with plan of treatment. I have discussed any further diagnostic evaluation that may be needed or ordered today. We also reviewed her medications today. she has been encouraged to call the office with any questions or concerns that should arise related to todays  visit.    No orders of the defined  types were placed in this encounter.   Meds ordered this encounter  Medications   meloxicam (MOBIC) 7.5 MG tablet    Sig: Take 1 tablet (7.5 mg total) by mouth daily.    Dispense:  30 tablet    Refill:  0    Discussed drug interactions with patent today 06/09/21. Patient will stop taking naproxen and try meloxicam and continue to monitor herself for bleeding, call w/ questions [phone]    Return in about 1 month (around 07/10/2021) for F/U, Recheck A1C, Kalden Wanke PCP.   Total time spent:30 Minutes Time spent includes review of chart, medications, test results, and follow up plan with the patient.   [place] Controlled Substance Database was reviewed by me. ORS 120  This patient was seen by Jonetta Osgood, FNP-C in collaboration with Dr. Clayborn Bigness as a part of collaborative care agreement.   Taneal Sonntag R. Valetta Fuller, MSN, FNP-C Internal medicine

## 2021-06-17 ENCOUNTER — Other Ambulatory Visit: Payer: Self-pay | Admitting: Internal Medicine

## 2021-06-17 DIAGNOSIS — Z7901 Long term (current) use of anticoagulants: Secondary | ICD-10-CM

## 2021-07-07 ENCOUNTER — Ambulatory Visit (INDEPENDENT_AMBULATORY_CARE_PROVIDER_SITE_OTHER): Payer: Medicare Other | Admitting: Nurse Practitioner

## 2021-07-07 ENCOUNTER — Other Ambulatory Visit: Payer: Self-pay

## 2021-07-07 ENCOUNTER — Encounter: Payer: Self-pay | Admitting: Nurse Practitioner

## 2021-07-07 VITALS — BP 120/50 | HR 91 | Temp 97.3°F | Resp 16 | Ht 64.0 in | Wt 142.2 lb

## 2021-07-07 DIAGNOSIS — Z23 Encounter for immunization: Secondary | ICD-10-CM

## 2021-07-07 DIAGNOSIS — M25551 Pain in right hip: Secondary | ICD-10-CM

## 2021-07-07 DIAGNOSIS — E1159 Type 2 diabetes mellitus with other circulatory complications: Secondary | ICD-10-CM

## 2021-07-07 LAB — POCT GLYCOSYLATED HEMOGLOBIN (HGB A1C): Hemoglobin A1C: 7.2 % — AB (ref 4.0–5.6)

## 2021-07-07 MED ORDER — MELOXICAM 7.5 MG PO TABS: 7.5000 mg | ORAL_TABLET | Freq: Every day | ORAL | 2 refills | Status: DC

## 2021-07-07 MED ORDER — METFORMIN HCL ER 500 MG PO TB24
500.0000 mg | ORAL_TABLET | Freq: Two times a day (BID) | ORAL | 3 refills | Status: DC
Start: 2021-07-07 — End: 2021-10-22

## 2021-07-16 ENCOUNTER — Other Ambulatory Visit: Payer: Self-pay | Admitting: Internal Medicine

## 2021-07-16 DIAGNOSIS — R350 Frequency of micturition: Secondary | ICD-10-CM

## 2021-07-19 NOTE — Progress Notes (Signed)
[facility] [place], [place] [address]  Internal MEDICINE  Office Visit Note  Patient Name: Monica [name]  [id number]  [id number]  Date of Service: 07/07/2021  Chief Complaint  Patient presents with  . Follow-up    Knot on right side hip area, might be oozing, has a scab like texture   . Diabetes  . Hyperlipidemia  . Hypertension  . COPD  . Quality Metric Gaps    Eye exam done at the beginning of the year    HPI Monica [name] presents for a follow up visit  for right hip pain s/p total right hip arthroplasty. She has been taking the meloxicam which has been helping. She continues to decline an orthopedic surgery consult or physical therapy.  A1C was checked today, it is 7.2 which is no change from may 2022. She is currently taking metformin twice daily.      Current Medication: Outpatient Encounter Medications as of 07/07/2021  Medication Sig  . albuterol (VENTOLIN HFA) 108 (90 Base) MCG/ACT inhaler Inhale 2 puffs into the lungs every 4 (four) hours as needed for wheezing or shortness of breath.  Marland Kitchen amLODipine-benazepril (LOTREL) 5-20 MG capsule TAKE 1 CAPSULE BY MOUTH ONCE DAILY  . clotrimazole-betamethasone (LOTRISONE) cream Apply 1 application topically 2 (two) times daily.  Marland Kitchen desloratadine (CLARINEX) 5 MG tablet TAKE ONE TABLET EVERY DAY  . ELIQUIS 2.5 MG TABS tablet TAKE 1 TABLET BY MOUTH TWICE DAILY  . feeding supplement (BOOST HIGH PROTEIN) LIQD Take 1 Container by mouth as needed.  . Ferrous Sulfate (IRON) 28 MG TABS Take 1 tablet (28 mg total) by mouth daily. Or any iron supplements over-the-counter.  . fluticasone furoate-vilanterol (BREO ELLIPTA) 100-25 MCG/INH AEPB TAKE 1 PUFF ONCE A DAY  . gabapentin (NEURONTIN) 300 MG capsule TAKE 2 CAPSULES BY MOUTH AT BEDTIME  . glucose blood (ONETOUCH ULTRA) test strip 1 each by Other route daily. Use as instructed  . hydrochlorothiazide (HYDRODIURIL) 25 MG tablet Take 1 tablet (25 mg total) by mouth daily.   Marland Kitchen nystatin cream (MYCOSTATIN) Apply topically 2 (two) times daily.  Glory Rosebush Delica Lancets 99991111 MISC Use as directed once daily as needed  . senna-docusate (SENOKOT-S) 8.6-50 MG tablet Take 1 tablet by mouth at bedtime as needed for mild constipation.  . Tdap (BOOSTRIX) 5-2.5-18.5 LF-MCG/0.5 injection Inject 0.5 mLs into the muscle once.  . triamcinolone (KENALOG) 0.025 % cream Apply 1 application topically 2 (two) times daily. Ok to mix with topical lotion and apply to all affected areas.  . [DISCONTINUED] meloxicam (MOBIC) 7.5 MG tablet Take 1 tablet (7.5 mg total) by mouth daily.  . [DISCONTINUED] metFORMIN (GLUCOPHAGE-XR) 500 MG 24 hr tablet Take 1 tablet (500 mg total) by mouth daily.  . [DISCONTINUED] oxybutynin (DITROPAN-XL) 10 MG 24 hr tablet TAKE ONE TABLET BY MOUTH EVERY DAY (Patient taking differently: Take 10 mg by mouth daily after breakfast.)  . [DISCONTINUED] THEO-24 100 MG 24 hr capsule TAKE 1 CAPSULE BY MOUTH EVERY DAY (Patient taking differently: Take 100 mg by mouth daily. TAKE 1 CAPSULE BY MOUTH EVERY DAY)  . meloxicam (MOBIC) 7.5 MG tablet Take 1 tablet (7.5 mg total) by mouth daily.  . metFORMIN (GLUCOPHAGE-XR) 500 MG 24 hr tablet Take 1 tablet (500 mg total) by mouth 2 (two) times daily with a meal.  . [DISCONTINUED] famotidine (PEPCID) 20 MG tablet Take 1 tablet (20 mg total) by mouth 2 (two) times daily.  . [DISCONTINUED] pravastatin (PRAVACHOL) 40 MG tablet Take  1 tablet (40 mg total) by mouth daily.   No facility-administered encounter medications on file as of 07/07/2021.    Surgical History: Past Surgical History:  Procedure Laterality Date  . ABDOMINAL HYSTERECTOMY  1975  . BLADDER SUSPENSION  1989  . CAROTID ANGIOGRAPHY Right 11/23/2019   Procedure: CAROTID ANGIOGRAPHY;  Surgeon: Katha Cabal, MD;  Location: [place] [facility] LAB;  Service: Cardiovascular;  Laterality: Right;  . COLONOSCOPY    . COLONOSCOPY WITH PROPOFOL N/A 01/25/2018   Procedure:  COLONOSCOPY WITH PROPOFOL;  Surgeon: [name], MD;  Location: [facility] ENDOSCOPY;  Service: Endoscopy;  Laterality: N/A;  . ESOPHAGOGASTRODUODENOSCOPY (EGD) WITH PROPOFOL N/A 01/24/2018   Procedure: ESOPHAGOGASTRODUODENOSCOPY (EGD) WITH PROPOFOL;  Surgeon: [name], MD;  Location: [facility] ENDOSCOPY;  Service: Endoscopy;  Laterality: N/A;  . HIP ARTHROPLASTY Left 06/03/2019   Procedure: ARTHROPLASTY BIPOLAR HIP (HEMIARTHROPLASTY);  Surgeon: Dereck Leep, MD;  Location: [facility] ORS;  Service: Orthopedics;  Laterality: Left;  . HIP ARTHROPLASTY Right 04/21/2020   Procedure: ARTHROPLASTY BIPOLAR HIP (HEMIARTHROPLASTY);  Surgeon: Corky Mull, MD;  Location: [facility] ORS;  Service: Orthopedics;  Laterality: Right;    Medical History: Past Medical History:  Diagnosis Date  . Atrial fibrillation ([place])   . Bronchitis   . Cancer ([place])    Left leg growth, kidneys, lungs and breasts  . Carcinoma of unknown primary ([place])   . COPD (chronic obstructive pulmonary disease) ([place])   . Diabetes mellitus without complication ([place])   . Hyperlipidemia   . Hypertension   . Pancreatitis   . Pneumonia   . Stroke ([place])    TIA's  . Vitamin B12 deficiency     Family History: Family History  Problem Relation Age of Onset  . Cancer Father   . Diabetes Brother     Social History   Socioeconomic History  . Marital status: Divorced    Spouse name: Not on file  . Number of children: Not on file  . Years of education: Not on file  . Highest education level: Not on file  Occupational History  . Not on file  Tobacco Use  . Smoking status: Former    Packs/day: 0.50    Years: 15.00    Pack years: 7.50    Types: Cigarettes    Quit date: 11/15/1988    Years since quitting: 32.6  . Smokeless tobacco: Never  Vaping Use  . Vaping Use: Never used  Substance and Sexual Activity  . Alcohol use: No    Alcohol/week: 0.0 standard drinks  . Drug use: No  . Sexual activity: Not on file    Comment: hysterectomy    Other Topics Concern  . Not on file  Social History Narrative  . Not on file   Social Determinants of Health   Financial Resource Strain: Not on file  Food Insecurity: Not on file  Transportation Needs: Not on file  Physical Activity: Not on file  Stress: Not on file  Social Connections: Not on file  Intimate Partner Violence: Not on file      Review of Systems  Constitutional:  Negative for chills, fatigue and unexpected weight change.  HENT:  Negative for congestion, rhinorrhea, sneezing and sore throat.   Eyes:  Negative for redness.  Respiratory:  Negative for cough, chest tightness and shortness of breath.   Cardiovascular:  Negative for chest pain and palpitations.  Gastrointestinal:  Negative for abdominal pain, constipation, diarrhea, nausea and vomiting.  Genitourinary:  Negative for dysuria and frequency.  Musculoskeletal:  Negative for arthralgias, back pain, joint swelling and neck pain.  Skin:  Negative for rash.  Neurological: Negative.  Negative for tremors and numbness.  Hematological:  Negative for adenopathy. Does not bruise/bleed easily.  Psychiatric/Behavioral:  Negative for behavioral problems (Depression), sleep disturbance and suicidal ideas. The patient is not nervous/anxious.    Vital Signs: BP (!) 120/50   Pulse 91   Temp (!) 97.3 F (36.3 C)   Resp 16   Ht '5\' 4"'$  (1.626 m)   Wt 142 lb 3.2 oz (64.5 kg)   SpO2 97%   BMI 24.41 kg/m    Physical Exam Vitals reviewed.  Constitutional:      General: She is not in acute distress.    Appearance: Normal appearance. She is normal weight. She is not ill-appearing.  HENT:     Head: Normocephalic and atraumatic.  Eyes:     Extraocular Movements: Extraocular movements intact.     Pupils: Pupils are equal, round, and reactive to light.  Cardiovascular:     Rate and Rhythm: Normal rate and regular rhythm.  Pulmonary:     Effort: Pulmonary effort is normal. No respiratory distress.  Neurological:      Mental Status: She is alert and oriented to person, place, and time.     Cranial Nerves: No cranial nerve deficit.     Coordination: Coordination normal.     Gait: Gait normal.  Psychiatric:        Mood and Affect: Mood normal.        Behavior: Behavior normal.    Assessment/Plan: 1. Type 2 diabetes mellitus with vascular disease ([place]) No change in A1C since may 2022. Continue current medication as prescribed. Discussed diet and lifestyle modifications in detail.  - POCT HgB A1C - metFORMIN (GLUCOPHAGE-XR) 500 MG 24 hr tablet; Take 1 tablet (500 mg total) by mouth 2 (two) times daily with a meal.  Dispense: 60 tablet; Refill: 3  2. Right hip pain Continue meloxicam as prescribed. Patient declined orthopedic surgery referral and physical therapy referral. - meloxicam (MOBIC) 7.5 MG tablet; Take 1 tablet (7.5 mg total) by mouth daily.  Dispense: 30 tablet; Refill: 2  3. Need for tetanus booster - Tdap (BOOSTRIX) 5-2.5-18.5 LF-MCG/0.5 injection; Inject 0.5 mLs into the muscle once.   General Counseling: Loyda verbalizes understanding of the findings of todays visit and agrees with plan of treatment. I have discussed any further diagnostic evaluation that may be needed or ordered today. We also reviewed her medications today. she has been encouraged to call the office with any questions or concerns that should arise related to todays visit.    Orders Placed This Encounter  Procedures  . POCT HgB A1C    Meds ordered this encounter  Medications  . metFORMIN (GLUCOPHAGE-XR) 500 MG 24 hr tablet    Sig: Take 1 tablet (500 mg total) by mouth 2 (two) times daily with a meal.    Dispense:  60 tablet    Refill:  3  . meloxicam (MOBIC) 7.5 MG tablet    Sig: Take 1 tablet (7.5 mg total) by mouth daily.    Dispense:  30 tablet    Refill:  2    Discussed drug interactions with patent today 06/09/21. Patient will stop taking naproxen and try meloxicam and continue to monitor herself for  bleeding, call w/ questions [phone]    Return in about 3 months (around 10/07/2021) for F/U, Recheck A1C, Katherin Ramey PCP.   Total time spent:20 Minutes  Time spent includes review of chart, medications, test results, and follow up plan with the patient.   [place] Controlled Substance Database was reviewed by me.  This patient was seen by Jonetta Osgood, FNP-C in collaboration with Dr. Clayborn Bigness as a part of collaborative care agreement.   Zanae Kuehnle R. Valetta Fuller, MSN, FNP-C Internal medicine

## 2021-08-04 DIAGNOSIS — Z23 Encounter for immunization: Secondary | ICD-10-CM | POA: Diagnosis not present

## 2021-08-11 ENCOUNTER — Telehealth: Payer: Self-pay

## 2021-08-11 NOTE — Telephone Encounter (Signed)
Physician orders for speech therapy signed by Yetta Flock and faxed back to [facility] [phone]. Copy of order placed in scan.

## 2021-08-13 DIAGNOSIS — R498 Other voice and resonance disorders: Secondary | ICD-10-CM | POA: Diagnosis not present

## 2021-08-13 DIAGNOSIS — R488 Other symbolic dysfunctions: Secondary | ICD-10-CM | POA: Diagnosis not present

## 2021-08-17 ENCOUNTER — Other Ambulatory Visit: Payer: Self-pay | Admitting: Internal Medicine

## 2021-08-17 DIAGNOSIS — L239 Allergic contact dermatitis, unspecified cause: Secondary | ICD-10-CM

## 2021-08-17 DIAGNOSIS — L209 Atopic dermatitis, unspecified: Secondary | ICD-10-CM

## 2021-08-20 DIAGNOSIS — R498 Other voice and resonance disorders: Secondary | ICD-10-CM | POA: Diagnosis not present

## 2021-08-20 DIAGNOSIS — R488 Other symbolic dysfunctions: Secondary | ICD-10-CM | POA: Diagnosis not present

## 2021-08-20 IMAGING — CT CT CERVICAL SPINE W/O CM
3 of 4 series · 10 of 33 positions shown, 12 images · non-contrast
Comparison: MRI brain 12/19/2014

CLINICAL DATA: Fall with head trauma/laceration. Patient reports
taking blood thinners.

EXAM:
CT HEAD WITHOUT CONTRAST
CT CERVICAL SPINE WITHOUT CONTRAST
TECHNIQUE: Multidetector CT imaging of the head and cervical spine was
performed following the standard protocol without intravenous
contrast. Multiplanar CT image reconstructions of the cervical spine
were also generated.

[Series 6: sagittal bone · sagittal · 0.24mm/px · 5 of 45 slices shown, 6 images]
[im 15/45  bone]
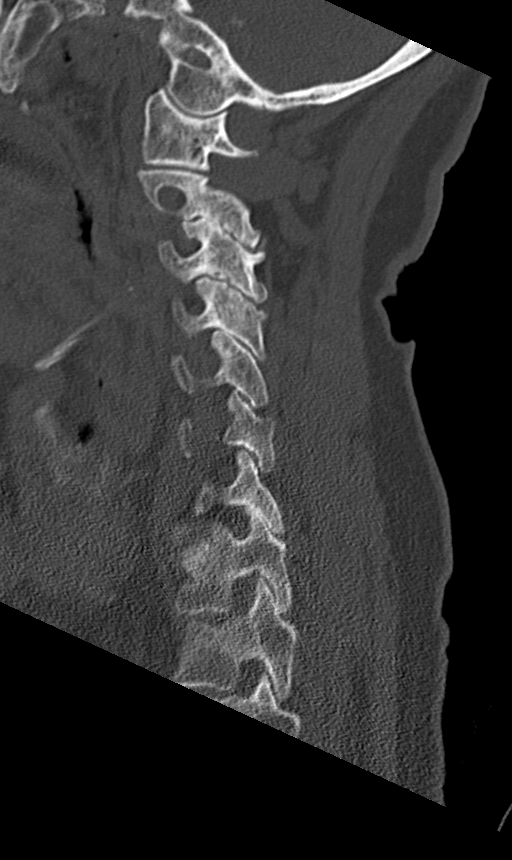
[im 19/45  bone]
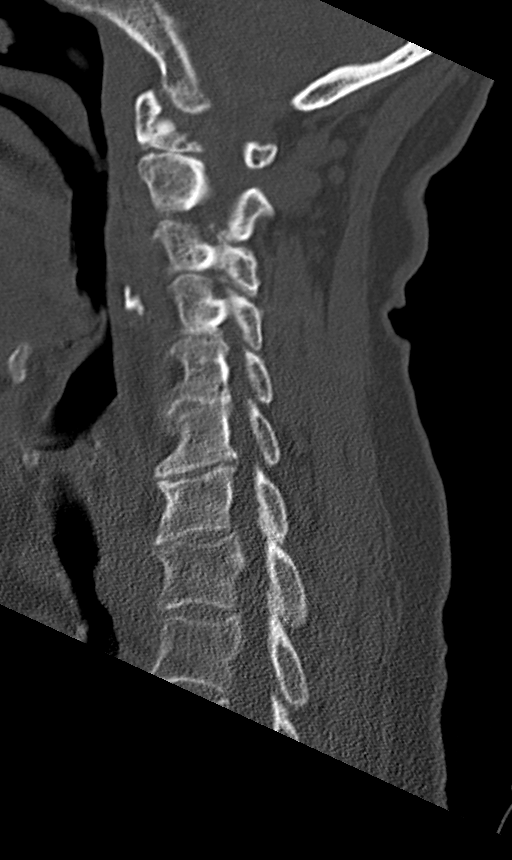
[im 23/45  soft-tissue]
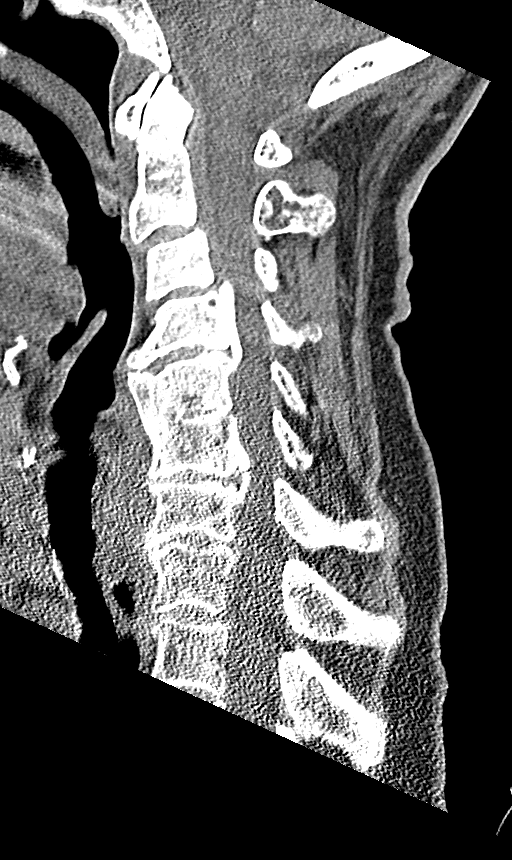
[im 23/45  bone]
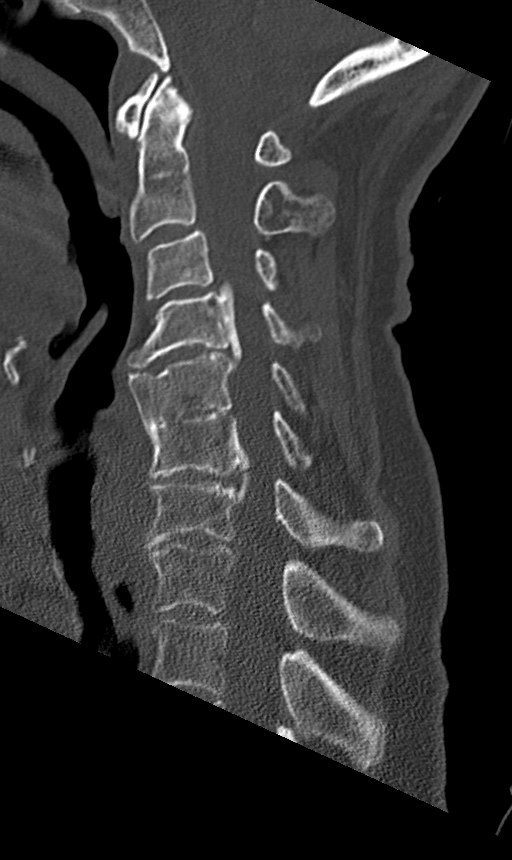
[im 26/45  bone]
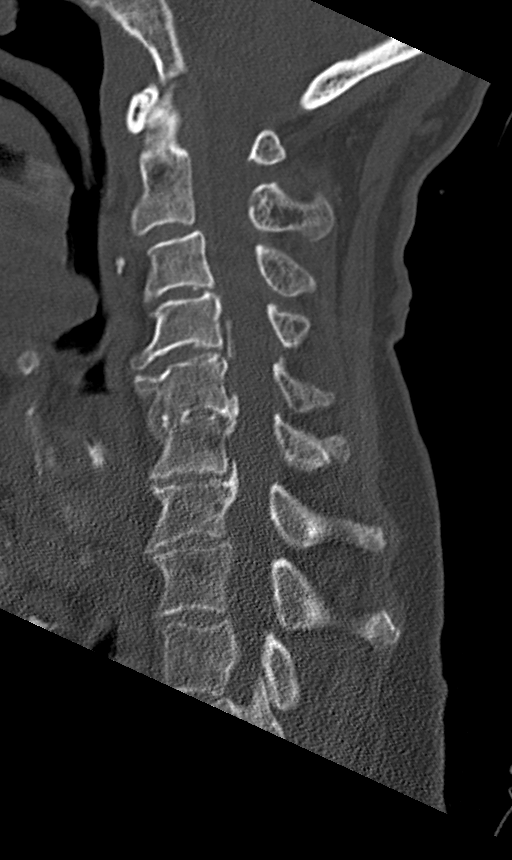
[im 30/45  bone]
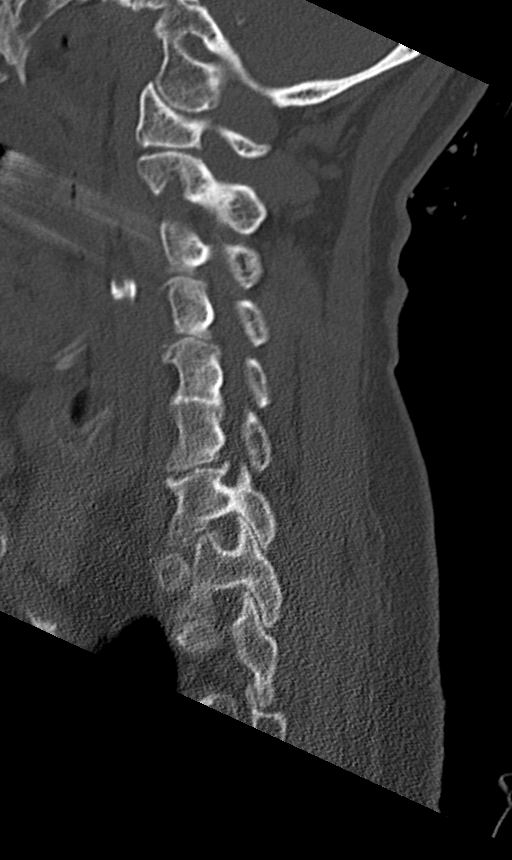

[Series 7: coronal bone · coronal · 0.24mm/px · 3 of 49 slices shown]
[im 10/49  bone]
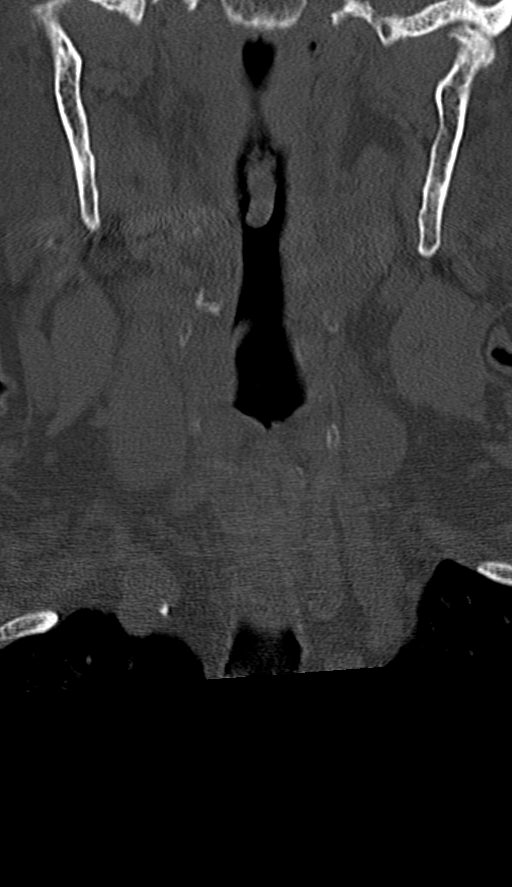
[im 20/49  bone]
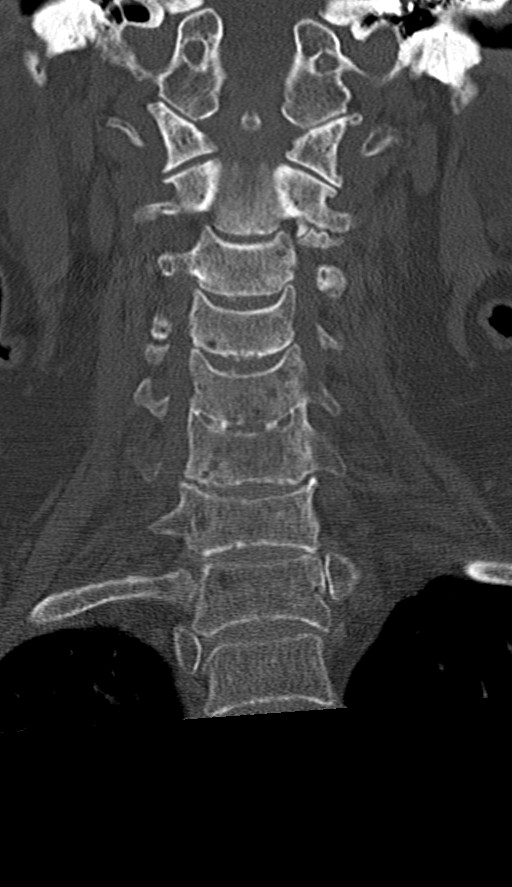
[im 29/49  bone]
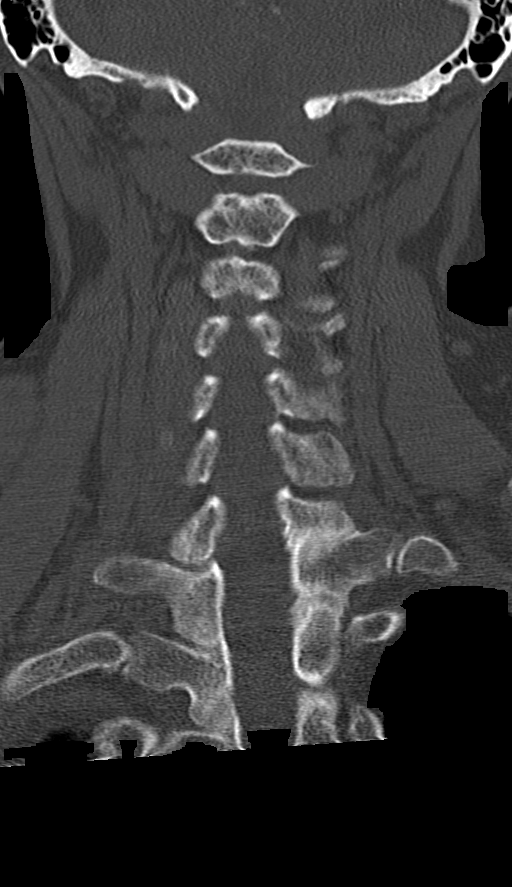

[Series 8: orthogonal bone · axial · 0.24mm/px · z∈[+407,+488]mm · 2 of 106 slices shown, 3 images]
[im 31/106  soft-tissue]
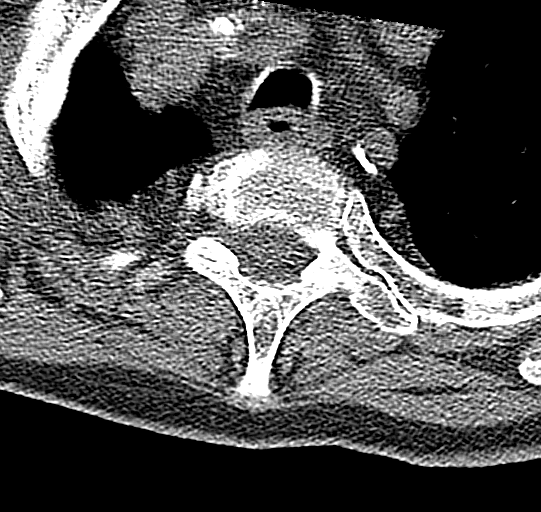
[im 31/106  bone]
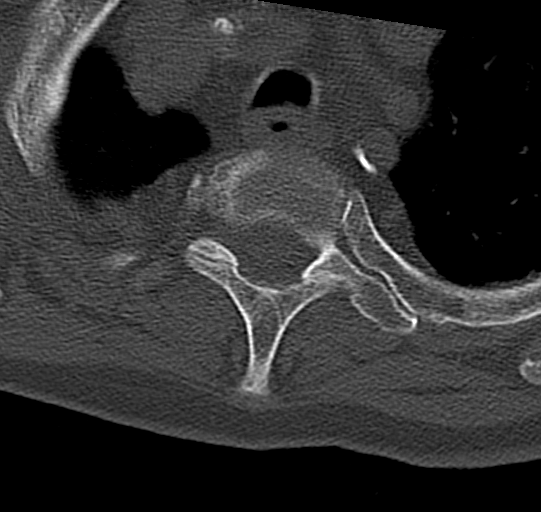
[im 76/106  bone]
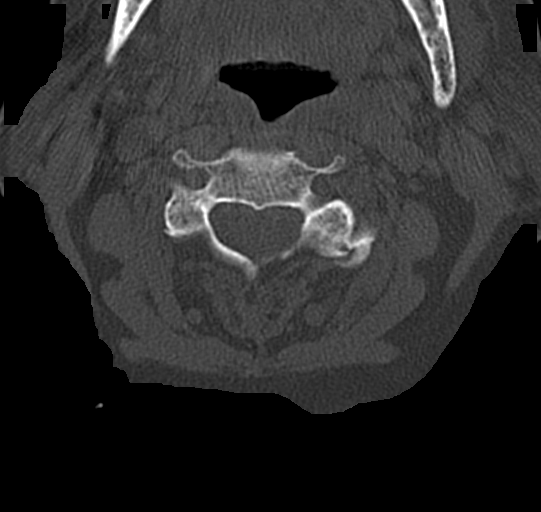

[10 of 33 positions shown; findings below may reference images not displayed]

FINDINGS: CT HEAD FINDINGS

Brain: Examination demonstrates the ventricles and cisterns to be
within normal. There is mild age related atrophic change which is
stable. There is chronic ischemic microvascular disease. There is no
mass, mass effect, shift of midline structures or acute hemorrhage.
No evidence of acute infarction.

Vascular: No hyperdense vessel or unexpected calcification.

Skull: Normal. Negative for fracture or focal lesion.

Sinuses/Orbits: No acute finding.

Other: Mild soft tissue swelling over the right posterior parietal
scalp.

CT CERVICAL SPINE FINDINGS

Alignment: Minimal reversal of the normal cervical lordosis.

Skull base and vertebrae: Vertebral body heights are maintained.
There is moderate spondylosis of the cervical spine. Prominent
anterior osteophyte at the C4-5 level. Posterior osteophytes at the
C5-6 and C6-7 levels. Ossification of the posterior longitudinal
ligament at the C4 level. There is uncovertebral joint spurring and
facet arthropathy. Atlantoaxial articulation is unremarkable.
Significant left-sided neural foraminal narrowing at the C2-3 and
C3-4 levels with bilateral neural foraminal narrowing from the C4-5
level to the C6-7 level. No acute fracture.

Soft tissues and spinal canal: Canal stenosis at the C6-7 level due
to prominent posterior osteophyte formation. Prevertebral soft
tissues are normal.

Disc levels: Mild disc space narrowing from the C4-5 level C7-T1
level.

Upper chest: Negative.

Other: None.
IMPRESSION: 1. No acute brain injury. Small right posterior parietal scalp
contusion.

2. Mild chronic ischemic microvascular disease and age related
atrophic change.

3.  No acute cervical spine injury.

4. Moderate spondylosis of the cervical spine with mild multilevel
disc disease as described. Significant multilevel neural foraminal
narrowing as described. Canal stenosis at the C6-7 level probably
due to posterior osteophyte formation.

## 2021-08-24 DIAGNOSIS — R488 Other symbolic dysfunctions: Secondary | ICD-10-CM | POA: Diagnosis not present

## 2021-08-24 DIAGNOSIS — R498 Other voice and resonance disorders: Secondary | ICD-10-CM | POA: Diagnosis not present

## 2021-08-26 ENCOUNTER — Other Ambulatory Visit: Payer: Self-pay | Admitting: Nurse Practitioner

## 2021-08-26 ENCOUNTER — Other Ambulatory Visit: Payer: Self-pay | Admitting: Physician Assistant

## 2021-08-26 DIAGNOSIS — J209 Acute bronchitis, unspecified: Secondary | ICD-10-CM

## 2021-08-26 DIAGNOSIS — J449 Chronic obstructive pulmonary disease, unspecified: Secondary | ICD-10-CM

## 2021-08-27 DIAGNOSIS — R488 Other symbolic dysfunctions: Secondary | ICD-10-CM | POA: Diagnosis not present

## 2021-08-27 DIAGNOSIS — R498 Other voice and resonance disorders: Secondary | ICD-10-CM | POA: Diagnosis not present

## 2021-08-28 ENCOUNTER — Telehealth: Payer: Self-pay

## 2021-08-28 NOTE — Telephone Encounter (Signed)
Speech therapy plan of care order signed by provider and faxed back to [facility] at [phone]. Order placed in scan.

## 2021-08-31 DIAGNOSIS — R488 Other symbolic dysfunctions: Secondary | ICD-10-CM | POA: Diagnosis not present

## 2021-08-31 DIAGNOSIS — R498 Other voice and resonance disorders: Secondary | ICD-10-CM | POA: Diagnosis not present

## 2021-09-03 DIAGNOSIS — R498 Other voice and resonance disorders: Secondary | ICD-10-CM | POA: Diagnosis not present

## 2021-09-03 DIAGNOSIS — R488 Other symbolic dysfunctions: Secondary | ICD-10-CM | POA: Diagnosis not present

## 2021-09-07 DIAGNOSIS — R488 Other symbolic dysfunctions: Secondary | ICD-10-CM | POA: Diagnosis not present

## 2021-09-07 DIAGNOSIS — R498 Other voice and resonance disorders: Secondary | ICD-10-CM | POA: Diagnosis not present

## 2021-09-11 DIAGNOSIS — R488 Other symbolic dysfunctions: Secondary | ICD-10-CM | POA: Diagnosis not present

## 2021-09-11 DIAGNOSIS — R498 Other voice and resonance disorders: Secondary | ICD-10-CM | POA: Diagnosis not present

## 2021-09-15 DIAGNOSIS — R488 Other symbolic dysfunctions: Secondary | ICD-10-CM | POA: Diagnosis not present

## 2021-09-15 DIAGNOSIS — R498 Other voice and resonance disorders: Secondary | ICD-10-CM | POA: Diagnosis not present

## 2021-09-17 DIAGNOSIS — R498 Other voice and resonance disorders: Secondary | ICD-10-CM | POA: Diagnosis not present

## 2021-09-17 DIAGNOSIS — R488 Other symbolic dysfunctions: Secondary | ICD-10-CM | POA: Diagnosis not present

## 2021-09-21 DIAGNOSIS — R498 Other voice and resonance disorders: Secondary | ICD-10-CM | POA: Diagnosis not present

## 2021-09-21 DIAGNOSIS — R488 Other symbolic dysfunctions: Secondary | ICD-10-CM | POA: Diagnosis not present

## 2021-10-06 ENCOUNTER — Other Ambulatory Visit: Payer: Self-pay

## 2021-10-06 ENCOUNTER — Ambulatory Visit (INDEPENDENT_AMBULATORY_CARE_PROVIDER_SITE_OTHER): Payer: Medicare Other | Admitting: Nurse Practitioner

## 2021-10-06 ENCOUNTER — Encounter: Payer: Self-pay | Admitting: Nurse Practitioner

## 2021-10-06 VITALS — BP 160/90 | HR 90 | Temp 98.4°F | Resp 16 | Ht 64.0 in | Wt 146.0 lb

## 2021-10-06 DIAGNOSIS — E1159 Type 2 diabetes mellitus with other circulatory complications: Secondary | ICD-10-CM

## 2021-10-06 DIAGNOSIS — M25551 Pain in right hip: Secondary | ICD-10-CM | POA: Diagnosis not present

## 2021-10-06 DIAGNOSIS — I10 Essential (primary) hypertension: Secondary | ICD-10-CM

## 2021-10-06 DIAGNOSIS — Z96641 Presence of right artificial hip joint: Secondary | ICD-10-CM

## 2021-10-06 LAB — POCT GLYCOSYLATED HEMOGLOBIN (HGB A1C): Hemoglobin A1C: 7.4 % — AB (ref 4.0–5.6)

## 2021-10-06 MED ORDER — AMLODIPINE BESY-BENAZEPRIL HCL 10-20 MG PO CAPS: 1.0000 | ORAL_CAPSULE | Freq: Every day | ORAL | 2 refills | Status: DC

## 2021-10-06 NOTE — Progress Notes (Signed)
[facility] [place], [place] [address]  Internal MEDICINE  Office Visit Note  Patient Name: Monica [name]  [id number]  [id number]  Date of Service: 10/06/2021  Chief Complaint  Patient presents with   Follow-up   Diabetes   Hyperlipidemia   Hypertension   COPD    HPI Monica [name] presents for a follow up visit for diabetes and hypertension. Her blood pressure is significantly elevated today. Her A1C is also elevated at 7.4 which is increased from 7.2 in august. Patient does not want to change anything about her medications regarding diabetes and would prefer to work on her diet and lifestyle.    Current Medication: Outpatient Encounter Medications as of 10/06/2021  Medication Sig   albuterol (VENTOLIN HFA) 108 (90 Base) MCG/ACT inhaler INHALE 2 PUFFS INTO LUNGS EVERY 4 HOURS AS NEEDED FOR WHEEZING OR SHORTNESS OF BREATH   amLODipine-benazepril (LOTREL) 10-20 MG capsule Take 1 capsule by mouth daily.   clotrimazole-betamethasone (LOTRISONE) cream Apply 1 application topically 2 (two) times daily.   desloratadine (CLARINEX) 5 MG tablet TAKE 1 TABLET BY MOUTH DAILY   ELIQUIS 2.5 MG TABS tablet TAKE 1 TABLET BY MOUTH TWICE DAILY   feeding supplement (BOOST HIGH PROTEIN) LIQD Take 1 Container by mouth as needed.   Ferrous Sulfate (IRON) 28 MG TABS Take 1 tablet (28 mg total) by mouth daily. Or any iron supplements over-the-counter.   fluticasone furoate-vilanterol (BREO ELLIPTA) 100-25 MCG/INH AEPB INHALE ONE PUFF EVERY DAY   gabapentin (NEURONTIN) 300 MG capsule TAKE 2 CAPSULES BY MOUTH AT BEDTIME   glucose blood (ONETOUCH ULTRA) test strip 1 each by Other route daily. Use as instructed   hydrochlorothiazide (HYDRODIURIL) 25 MG tablet Take 1 tablet (25 mg total) by mouth daily.   meloxicam (MOBIC) 7.5 MG tablet Take 1 tablet (7.5 mg total) by mouth daily.   nystatin cream (MYCOSTATIN) Apply topically 2 (two) times daily.   OneTouch Delica Lancets 67R MISC Use as  directed once daily as needed   oxybutynin (DITROPAN-XL) 10 MG 24 hr tablet TAKE 1 TABLET BY MOUTH DAILY   senna-docusate (SENOKOT-S) 8.6-50 MG tablet Take 1 tablet by mouth at bedtime as needed for mild constipation.   Tdap (BOOSTRIX) 5-2.5-18.5 LF-MCG/0.5 injection Inject 0.5 mLs into the muscle once.   THEO-24 100 MG 24 hr capsule TAKE 1 CAPSULE BY MOUTH EVERY DAY   triamcinolone (KENALOG) 0.025 % cream Apply 1 application topically 2 (two) times daily. Ok to mix with topical lotion and apply to all affected areas.   [DISCONTINUED] amLODipine-benazepril (LOTREL) 5-20 MG capsule TAKE 1 CAPSULE BY MOUTH ONCE DAILY   [DISCONTINUED] metFORMIN (GLUCOPHAGE-XR) 500 MG 24 hr tablet Take 1 tablet (500 mg total) by mouth 2 (two) times daily with a meal.   [DISCONTINUED] famotidine (PEPCID) 20 MG tablet Take 1 tablet (20 mg total) by mouth 2 (two) times daily.   [DISCONTINUED] pravastatin (PRAVACHOL) 40 MG tablet Take 1 tablet (40 mg total) by mouth daily.   No facility-administered encounter medications on file as of 10/06/2021.    Surgical History: Past Surgical History:  Procedure Laterality Date   [place]   CAROTID ANGIOGRAPHY Right 11/23/2019   Procedure: CAROTID ANGIOGRAPHY;  Surgeon: Katha Cabal, MD;  Location: [place] CV LAB;  Service: Cardiovascular;  Laterality: Right;   COLONOSCOPY     COLONOSCOPY WITH PROPOFOL N/A 01/25/2018   Procedure: COLONOSCOPY WITH PROPOFOL;  Surgeon: [name], MD;  Location: [facility] ENDOSCOPY;  Service: Endoscopy;  Laterality: N/A;   ESOPHAGOGASTRODUODENOSCOPY (EGD) WITH PROPOFOL N/A 01/24/2018   Procedure: ESOPHAGOGASTRODUODENOSCOPY (EGD) WITH PROPOFOL;  Surgeon: [name], MD;  Location: [facility] ENDOSCOPY;  Service: Endoscopy;  Laterality: N/A;   HIP ARTHROPLASTY Left 06/03/2019   Procedure: ARTHROPLASTY BIPOLAR HIP (HEMIARTHROPLASTY);  Surgeon: Dereck Leep, MD;  Location: [facility] ORS;  Service:  Orthopedics;  Laterality: Left;   HIP ARTHROPLASTY Right 04/21/2020   Procedure: ARTHROPLASTY BIPOLAR HIP (HEMIARTHROPLASTY);  Surgeon: Corky Mull, MD;  Location: [facility] ORS;  Service: Orthopedics;  Laterality: Right;    Medical History: Past Medical History:  Diagnosis Date   Atrial fibrillation (HCC)    Bronchitis    Cancer (HCC)    Left leg growth, kidneys, lungs and breasts   Carcinoma of unknown primary ([place])    COPD (chronic obstructive pulmonary disease) ([place])    Diabetes mellitus without complication ([place])    Hyperlipidemia    Hypertension    Pancreatitis    Pneumonia    Stroke ([place])    TIA's   Vitamin B12 deficiency     Family History: Family History  Problem Relation Age of Onset   Cancer Father    Diabetes Brother     Social History   Socioeconomic History   Marital status: Divorced    Spouse name: Not on file   Number of children: Not on file   Years of education: Not on file   Highest education level: Not on file  Occupational History   Not on file  Tobacco Use   Smoking status: Former    Packs/day: 0.50    Years: 15.00    Pack years: 7.50    Types: Cigarettes    Quit date: 11/15/1988    Years since quitting: 32.9   Smokeless tobacco: Never  Vaping Use   Vaping Use: Never used  Substance and Sexual Activity   Alcohol use: No    Alcohol/week: 0.0 standard drinks   Drug use: No   Sexual activity: Not on file    Comment: hysterectomy   Other Topics Concern   Not on file  Social History Narrative   Not on file   Social Determinants of Health   Financial Resource Strain: Not on file  Food Insecurity: Not on file  Transportation Needs: Not on file  Physical Activity: Not on file  Stress: Not on file  Social Connections: Not on file  Intimate Partner Violence: Not on file      Review of Systems  Constitutional:  Negative for chills, fatigue and unexpected weight change.  HENT:  Negative for congestion, rhinorrhea, sneezing and sore  throat.   Eyes:  Negative for redness.  Respiratory:  Negative for cough, chest tightness and shortness of breath.   Cardiovascular:  Negative for chest pain and palpitations.  Gastrointestinal:  Negative for abdominal pain, constipation, diarrhea, nausea and vomiting.  Genitourinary:  Negative for dysuria and frequency.  Musculoskeletal:  Positive for arthralgias. Negative for back pain, joint swelling and neck pain.  Skin:  Negative for rash.  Neurological: Negative.  Negative for tremors and numbness.  Hematological:  Negative for adenopathy. Does not bruise/bleed easily.  Psychiatric/Behavioral:  Negative for behavioral problems (Depression), sleep disturbance and suicidal ideas. The patient is not nervous/anxious.    Vital Signs: BP (!) 160/90 Comment: 186/97  Pulse 90   Temp 98.4 F (36.9 C)   Resp 16   Ht 5\' 4"  (1.626 m)   Wt 146 lb (66.2 kg)  SpO2 97%   BMI 25.06 kg/m    Physical Exam Vitals reviewed.  Constitutional:      General: She is not in acute distress.    Appearance: Normal appearance. She is normal weight. She is not ill-appearing.  HENT:     Head: Normocephalic and atraumatic.  Eyes:     Pupils: Pupils are equal, round, and reactive to light.  Cardiovascular:     Rate and Rhythm: Normal rate and regular rhythm.  Pulmonary:     Effort: Pulmonary effort is normal. No respiratory distress.  Neurological:     Mental Status: She is alert and oriented to person, place, and time.     Cranial Nerves: No cranial nerve deficit.     Coordination: Coordination normal.     Gait: Gait normal.  Psychiatric:        Mood and Affect: Mood normal.        Behavior: Behavior normal.       Assessment/Plan: 1. Essential hypertension Lotrel dose increased, follow up in 7 weeks.  - amLODipine-benazepril (LOTREL) 10-20 MG capsule; Take 1 capsule by mouth daily.  Dispense: 30 capsule; Refill: 2  2. Type 2 diabetes mellitus with vascular disease (HCC) A1C increased to  7.4 from 7.2. patient is working on diet and lifestyle modifications as discussed - POCT HgB A1C  3. Right hip pain Taking OTC motrin, tried meloxicam but prefers motrin  4. S/P hip replacement, right Still bothers her but she does not want to see another orthopedic surgeon to have it reevaluated.     General Counseling: Alek verbalizes understanding of the findings of todays visit and agrees with plan of treatment. I have discussed any further diagnostic evaluation that may be needed or ordered today. We also reviewed her medications today. she has been encouraged to call the office with any questions or concerns that should arise related to todays visit.    Orders Placed This Encounter  Procedures   POCT HgB A1C    Meds ordered this encounter  Medications   amLODipine-benazepril (LOTREL) 10-20 MG capsule    Sig: Take 1 capsule by mouth daily.    Dispense:  30 capsule    Refill:  2    Please discontinue 5-20 mg dose, note this is an increased dose (10-20 mg).    Return in about 7 weeks (around 11/24/2021) for F/U, BP check, Monetta Lick PCP.   Total time spent:30 Minutes Time spent includes review of chart, medications, test results, and follow up plan with the patient.   [place] Controlled Substance Database was reviewed by me.  This patient was seen by Jonetta Osgood, FNP-C in collaboration with Dr. Clayborn Bigness as a part of collaborative care agreement.   Idara [place]side R. Valetta Fuller, MSN, FNP-C Internal medicine

## 2021-10-22 ENCOUNTER — Other Ambulatory Visit: Payer: Self-pay | Admitting: Nurse Practitioner

## 2021-10-22 DIAGNOSIS — E1159 Type 2 diabetes mellitus with other circulatory complications: Secondary | ICD-10-CM

## 2021-11-03 ENCOUNTER — Encounter: Payer: Self-pay | Admitting: Nurse Practitioner

## 2021-11-03 ENCOUNTER — Telehealth: Payer: Self-pay | Admitting: Nurse Practitioner

## 2021-11-03 NOTE — Chronic Care Management (AMB) (Signed)
°  Chronic Care Management   Outreach Note  11/03/2021 Name: Monica [name] MRN: [id number] DOB: 1926-04-15  Referred by: Jonetta Osgood, NP Reason for referral : No chief complaint on file.   An unsuccessful telephone outreach was attempted today. The patient was referred to the pharmacist for assistance with care management and care coordination.   Follow Up Plan:   Tatjana Dellinger Upstream Scheduler

## 2021-11-10 ENCOUNTER — Telehealth: Payer: Self-pay | Admitting: Nurse Practitioner

## 2021-11-10 NOTE — Chronic Care Management (AMB) (Signed)
°  Chronic Care Management   Note  11/10/2021 Name: DAISSY YERIAN MRN: [id number] DOB: 1925-12-30  CHRISTAN CICCARELLI is a 87 y.o. year old female who is a primary care patient of Jonetta Osgood, NP. I reached out to Valaria Good by phone today in response to a referral sent by Ms. Benjiman Core Ehinger's PCP, Jonetta Osgood, NP.   Ms. Mackowski was given information about Chronic Care Management services today including:  CCM service includes personalized support from designated clinical staff supervised by her physician, including individualized plan of care and coordination with other care providers 24/7 contact phone numbers for assistance for urgent and routine care needs. Service will only be billed when office clinical staff spend 20 minutes or more in a month to coordinate care. Only one practitioner may furnish and bill the service in a calendar month. The patient may stop CCM services at any time (effective at the end of the month) by phone call to the office staff.   Patient wishes to consider information provided and/or speak with a member of the care team before deciding about enrollment in care management services.   Follow up plan:   Tatjana Secretary/administrator

## 2021-11-13 ENCOUNTER — Other Ambulatory Visit: Payer: Self-pay | Admitting: Internal Medicine

## 2021-11-13 DIAGNOSIS — Z7901 Long term (current) use of anticoagulants: Secondary | ICD-10-CM

## 2021-11-19 ENCOUNTER — Other Ambulatory Visit: Payer: Self-pay | Admitting: Internal Medicine

## 2021-11-24 ENCOUNTER — Encounter: Payer: Self-pay | Admitting: Nurse Practitioner

## 2021-11-24 ENCOUNTER — Ambulatory Visit (INDEPENDENT_AMBULATORY_CARE_PROVIDER_SITE_OTHER): Payer: Medicare Other | Admitting: Nurse Practitioner

## 2021-11-24 ENCOUNTER — Other Ambulatory Visit: Payer: Self-pay

## 2021-11-24 VITALS — BP 140/60 | HR 95 | Temp 98.5°F | Resp 16 | Ht 64.0 in | Wt 148.4 lb

## 2021-11-24 DIAGNOSIS — Z0001 Encounter for general adult medical examination with abnormal findings: Secondary | ICD-10-CM | POA: Diagnosis not present

## 2021-11-24 DIAGNOSIS — R300 Dysuria: Secondary | ICD-10-CM

## 2021-11-24 DIAGNOSIS — I10 Essential (primary) hypertension: Secondary | ICD-10-CM

## 2021-11-24 DIAGNOSIS — I482 Chronic atrial fibrillation, unspecified: Secondary | ICD-10-CM | POA: Diagnosis not present

## 2021-11-24 DIAGNOSIS — E782 Mixed hyperlipidemia: Secondary | ICD-10-CM | POA: Diagnosis not present

## 2021-11-24 DIAGNOSIS — E538 Deficiency of other specified B group vitamins: Secondary | ICD-10-CM

## 2021-11-24 DIAGNOSIS — E1159 Type 2 diabetes mellitus with other circulatory complications: Secondary | ICD-10-CM

## 2021-11-24 DIAGNOSIS — E559 Vitamin D deficiency, unspecified: Secondary | ICD-10-CM

## 2021-11-24 NOTE — Progress Notes (Signed)
[facility] [place], [place] [address]  Internal MEDICINE  Office Visit Note  Patient Name: Monica [name]  [id number]  [id number]  Date of Service: 11/24/2021  Chief Complaint  Patient presents with   Medicare Wellness    Frequency urinating at night     HPI Monica [name] presents for an annual well visit and physical exam. She is a well appearing 88 yo female with hypertension and diabetes. Her blood pressure is stable. Her A1C in November was 7.4, it will not be time to repeat the A1C until February. She reports that she feels healthy and the only concerns she has are about her blood pressure and glucose. She reports that she typically does not have any issues with urination or her bowel movements but if she is a little constipated then she will take milk of magnesia and that usually resolves it. She states that sometimes she has to take something stronger like dulcolax. She states that her appetite is normal and she had a good holiday. She got some books for Christmas. She loves to read and has read 4 books already this year. She denies any depression or anxiety.today. She reports that her voice has changes over the years and she cannot hit high notes like she used to.  Monica [name] states that she does have an advanced directive and a living will. She was diagnosed with cancer previously and was told to get her affairs in order and that she only had 6 months to live at the time. She reports that she was tld that 3 years ago and she is doing well.    Current Medication: Outpatient Encounter Medications as of 11/24/2021  Medication Sig   albuterol (VENTOLIN HFA) 108 (90 Base) MCG/ACT inhaler INHALE 2 PUFFS INTO LUNGS EVERY 4 HOURS AS NEEDED FOR WHEEZING OR SHORTNESS OF BREATH   amLODipine-benazepril (LOTREL) 10-20 MG capsule Take 1 capsule by mouth daily.   clotrimazole-betamethasone (LOTRISONE) cream Apply 1 application topically 2 (two) times daily.   desloratadine (CLARINEX)  5 MG tablet TAKE 1 TABLET BY MOUTH DAILY   ELIQUIS 2.5 MG TABS tablet TAKE 1 TABLET BY MOUTH TWICE DAILY   feeding supplement (BOOST HIGH PROTEIN) LIQD Take 1 Container by mouth as needed.   Ferrous Sulfate (IRON) 28 MG TABS Take 1 tablet (28 mg total) by mouth daily. Or any iron supplements over-the-counter.   fluticasone furoate-vilanterol (BREO ELLIPTA) 100-25 MCG/INH AEPB INHALE ONE PUFF EVERY DAY   gabapentin (NEURONTIN) 300 MG capsule TAKE 2 CAPSULES BY MOUTH AT BEDTIME   glucose blood (ONETOUCH ULTRA) test strip 1 each by Other route daily. Use as instructed   hydrochlorothiazide (HYDRODIURIL) 25 MG tablet Take 1 tablet (25 mg total) by mouth daily.   meloxicam (MOBIC) 7.5 MG tablet Take 1 tablet (7.5 mg total) by mouth daily.   metFORMIN (GLUCOPHAGE-XR) 500 MG 24 hr tablet TAKE 1 TABLET BY MOUTH 2 TIMES DAILY WITH A MEAL.   nystatin cream (MYCOSTATIN) Apply topically 2 (two) times daily.   OneTouch Delica Lancets 70B MISC Use as directed once daily as needed   oxybutynin (DITROPAN-XL) 10 MG 24 hr tablet TAKE 1 TABLET BY MOUTH DAILY   senna-docusate (SENOKOT-S) 8.6-50 MG tablet Take 1 tablet by mouth at bedtime as needed for mild constipation.   Tdap (BOOSTRIX) 5-2.5-18.5 LF-MCG/0.5 injection Inject 0.5 mLs into the muscle once.   THEO-24 100 MG 24 hr capsule TAKE 1 CAPSULE BY MOUTH EVERY DAY   triamcinolone (KENALOG) 0.025 %  cream Apply 1 application topically 2 (two) times daily. Ok to mix with topical lotion and apply to all affected areas.   [DISCONTINUED] famotidine (PEPCID) 20 MG tablet Take 1 tablet (20 mg total) by mouth 2 (two) times daily.   [DISCONTINUED] pravastatin (PRAVACHOL) 40 MG tablet Take 1 tablet (40 mg total) by mouth daily.   No facility-administered encounter medications on file as of 11/24/2021.    Surgical History: Past Surgical History:  Procedure Laterality Date   [place]   CAROTID ANGIOGRAPHY Right 11/23/2019    Procedure: CAROTID ANGIOGRAPHY;  Surgeon: Katha Cabal, MD;  Location: [facility];  Service: Cardiovascular;  Laterality: Right;   COLONOSCOPY     COLONOSCOPY WITH PROPOFOL N/A 01/25/2018   Procedure: COLONOSCOPY WITH PROPOFOL;  Surgeon: [name], MD;  Location: [facility] ENDOSCOPY;  Service: Endoscopy;  Laterality: N/A;   ESOPHAGOGASTRODUODENOSCOPY (EGD) WITH PROPOFOL N/A 01/24/2018   Procedure: ESOPHAGOGASTRODUODENOSCOPY (EGD) WITH PROPOFOL;  Surgeon: [name], MD;  Location[facility] ENDOSCOPY;  Service: Endoscopy;  Laterality: N/A;   HIP ARTHROPLASTY Left 06/03/2019   Procedure: ARTHROPLASTY BIPOLAR HIP (HEMIARTHROPLASTY);  Surgeon: Dereck Leep, MD;  Location: [facility] ORS;  Service: Orthopedics;  Laterality: Left;   HIP ARTHROPLASTY Right 04/21/2020   Procedure: ARTHROPLASTY BIPOLAR HIP (HEMIARTHROPLASTY);  Surgeon: Corky Mull, MD;  Location: [facility] ORS;  Service: Orthopedics;  Laterality: Right;    Medical History: Past Medical History:  Diagnosis Date   Atrial fibrillation (HCC)    Bronchitis    Cancer (HCC)    Left leg growth, kidneys, lungs and breasts   Carcinoma of unknown primary ([place])    COPD (chronic obstructive pulmonary disease) ([place])    Diabetes mellitus without complication ([place])    Hyperlipidemia    Hypertension    Pancreatitis    Pneumonia    Stroke ([place])    TIA's   Vitamin B12 deficiency     Family History: Family History  Problem Relation Age of Onset   Cancer Father    Diabetes Brother     Social History   Socioeconomic History   Marital status: Divorced    Spouse name: Not on file   Number of children: Not on file   Years of education: Not on file   Highest education level: Not on file  Occupational History   Not on file  Tobacco Use   Smoking status: Former    Packs/day: 0.50    Years: 15.00    Pack years: 7.50    Types: Cigarettes    Quit date: 11/15/1988    Years since quitting: 33.0   Smokeless tobacco: Never  Vaping Use    Vaping Use: Never used  Substance and Sexual Activity   Alcohol use: No    Alcohol/week: 0.0 standard drinks   Drug use: No   Sexual activity: Not on file    Comment: hysterectomy   Other Topics Concern   Not on file  Social History Narrative   Not on file   Social Determinants of Health   Financial Resource Strain: Not on file  Food Insecurity: Not on file  Transportation Needs: Not on file  Physical Activity: Not on file  Stress: Not on file  Social Connections: Not on file  Intimate Partner Violence: Not on file      Review of Systems  Constitutional:  Negative for activity change, appetite change, chills, fatigue, fever and unexpected weight change.  HENT: Negative.  Negative  for congestion, ear pain, rhinorrhea, sore throat and trouble swallowing.   Eyes: Negative.   Respiratory: Negative.  Negative for cough, chest tightness, shortness of breath and wheezing.   Cardiovascular: Negative.  Negative for chest pain.  Gastrointestinal: Negative.  Negative for abdominal pain, blood in stool, constipation, diarrhea, nausea and vomiting.  Endocrine: Negative.   Genitourinary:  Positive for frequency. Negative for difficulty urinating, dysuria, hematuria and urgency.  Musculoskeletal: Negative.  Negative for arthralgias, back pain, joint swelling, myalgias and neck pain.  Skin: Negative.  Negative for rash and wound.  Allergic/Immunologic: Negative.  Negative for immunocompromised state.  Neurological: Negative.  Negative for dizziness, seizures, numbness and headaches.  Hematological: Negative.   Psychiatric/Behavioral: Negative.  Negative for behavioral problems, self-injury and suicidal ideas. The patient is not nervous/anxious.    Vital Signs: BP 140/60 Comment: 142/50   Pulse 95    Temp 98.5 F (36.9 C)    Resp 16    Ht 5' 4"  (1.626 m)    Wt 148 lb 6.4 oz (67.3 kg)    SpO2 96%    BMI 25.47 kg/m    Physical Exam Vitals reviewed.  Constitutional:      General: She  is awake. She is not in acute distress.    Appearance: Normal appearance. She is well-developed, well-groomed and overweight. She is not ill-appearing or diaphoretic.  HENT:     Head: Normocephalic and atraumatic.     Right Ear: Tympanic membrane, ear canal and external ear normal.     Left Ear: Tympanic membrane, ear canal and external ear normal.     Nose: Nose normal. No congestion or rhinorrhea.     Mouth/Throat:     Lips: Pink.     Mouth: Mucous membranes are moist.     Pharynx: Oropharynx is clear. Uvula midline. No oropharyngeal exudate or posterior oropharyngeal erythema.  Eyes:     General: Lids are normal. Vision grossly intact. Gaze aligned appropriately. No scleral icterus.       Right eye: No discharge.        Left eye: No discharge.     Extraocular Movements: Extraocular movements intact.     Conjunctiva/sclera: Conjunctivae normal.     Pupils: Pupils are equal, round, and reactive to light.  Neck:     Thyroid: No thyromegaly.     Vascular: No JVD.     Trachea: No tracheal deviation.     Comments: Voice has changed over the years.  Cardiovascular:     Rate and Rhythm: Normal rate and regular rhythm.     Pulses:          Dorsalis pedis pulses are 2+ on the right side and 2+ on the left side.       Posterior tibial pulses are 1+ on the right side and 1+ on the left side.     Heart sounds: Normal heart sounds, S1 normal and S2 normal. No murmur heard.   No friction rub. No gallop.  Pulmonary:     Effort: Pulmonary effort is normal. No accessory muscle usage or respiratory distress.     Breath sounds: Normal breath sounds and air entry. No stridor. No wheezing or rales.  Chest:     Chest wall: No tenderness.     Comments: Declined clinical breast exam, no longer gets regular mammograms.  Abdominal:     General: Bowel sounds are normal. There is no distension.     Palpations: Abdomen is soft. There is no shifting dullness,  fluid wave, mass or pulsatile mass.      Tenderness: There is no abdominal tenderness. There is no guarding or rebound.  Musculoskeletal:        General: No tenderness.     Cervical back: Normal range of motion and neck supple.     Right lower leg: 1+ Edema (wears compression stockings) present.     Left lower leg: 1+ Edema (wearings compression stockings) present.     Right foot: Normal range of motion. No deformity, bunion, Charcot foot, foot drop or prominent metatarsal heads.     Left foot: Normal range of motion. No deformity, bunion, Charcot foot, foot drop or prominent metatarsal heads.  Feet:     Right foot:     Skin integrity: Skin integrity normal.     Toenail Condition: Right toenails are long.     Left foot:     Skin integrity: Skin integrity normal.     Toenail Condition: Left toenails are long.  Lymphadenopathy:     Cervical: No cervical adenopathy.  Skin:    General: Skin is warm and dry.     Capillary Refill: Capillary refill takes less than 2 seconds.     Coloration: Skin is not pale.     Findings: No erythema or rash.  Neurological:     Mental Status: She is alert and oriented to person, place, and time.     Cranial Nerves: No cranial nerve deficit.     Motor: No abnormal muscle tone.     Coordination: Coordination normal.     Deep Tendon Reflexes: Reflexes are normal and symmetric.  Psychiatric:        Mood and Affect: Mood normal.        Behavior: Behavior normal. Behavior is cooperative.        Thought Content: Thought content normal.        Judgment: Judgment normal.       Assessment/Plan: 1. Encounter for general adult medical examination with abnormal findings Age-appropriate preventive screenings and vaccinations discussed, annual physical exam completed. Routine labs for health maintenance ordered per patient request, see below. PHM updated.   2. Type 2 diabetes mellitus with vascular disease ([place]) Routine labs ordered, continue current medications. - CBC with Differential/Platelet -  CMP14+EGFR - TSH + free T4  3. Essential hypertension Blood pressure is stable.   4. Atrial fibrillation, chronic (HCC) Routine labs ordered - CBC with Differential/Platelet - CMP14+EGFR - TSH + free T4  5. Mixed hyperlipidemia Routine lab ordered  - Lipid Profile - TSH + free T4  6. Vitamin D deficiency Routine lab ordered - Vitamin D (25 hydroxy)  7. Dysuria Routine urinalysis done - UA/M w/rflx Culture, Routine      General Counseling: Essica verbalizes understanding of the findings of todays visit and agrees with plan of treatment. I have discussed any further diagnostic evaluation that may be needed or ordered today. We also reviewed her medications today. she has been encouraged to call the office with any questions or concerns that should arise related to todays visit.    Orders Placed This Encounter  Procedures   UA/M w/rflx Culture, Routine   CBC with Differential/Platelet   CMP14+EGFR   Lipid Profile   Vitamin D (25 hydroxy)   TSH + free T4    No orders of the defined types were placed in this encounter.   Return in about 2 months (around 01/22/2022) for F/U, Recheck A1C, Arsenio Schnorr PCP.   Total time spent:30 Minutes Time  spent includes review of chart, medications, test results, and follow up plan with the patient.   [place] Controlled Substance Database was reviewed by me.  This patient was seen by Jonetta Osgood, FNP-C in collaboration with Dr. Clayborn Bigness as a part of collaborative care agreement.  Kinzey Sheriff R. Valetta Fuller, MSN, FNP-C Internal medicine

## 2021-11-25 ENCOUNTER — Encounter: Payer: Self-pay | Admitting: Nurse Practitioner

## 2021-11-26 LAB — UA/M W/RFLX CULTURE, ROUTINE

## 2021-12-17 ENCOUNTER — Other Ambulatory Visit: Payer: Self-pay | Admitting: Nurse Practitioner

## 2021-12-17 ENCOUNTER — Other Ambulatory Visit: Payer: Self-pay | Admitting: Internal Medicine

## 2021-12-17 DIAGNOSIS — R350 Frequency of micturition: Secondary | ICD-10-CM

## 2021-12-17 DIAGNOSIS — I10 Essential (primary) hypertension: Secondary | ICD-10-CM

## 2022-01-14 ENCOUNTER — Other Ambulatory Visit: Payer: Self-pay | Admitting: Nurse Practitioner

## 2022-01-14 DIAGNOSIS — L239 Allergic contact dermatitis, unspecified cause: Secondary | ICD-10-CM

## 2022-01-14 DIAGNOSIS — L209 Atopic dermatitis, unspecified: Secondary | ICD-10-CM

## 2022-01-19 ENCOUNTER — Other Ambulatory Visit: Payer: Self-pay

## 2022-01-19 ENCOUNTER — Encounter: Payer: Self-pay | Admitting: Nurse Practitioner

## 2022-01-19 ENCOUNTER — Ambulatory Visit (INDEPENDENT_AMBULATORY_CARE_PROVIDER_SITE_OTHER): Payer: Medicare Other | Admitting: Nurse Practitioner

## 2022-01-19 VITALS — BP 148/68 | HR 100 | Temp 98.6°F | Resp 16 | Ht 64.0 in | Wt 150.4 lb

## 2022-01-19 DIAGNOSIS — J3089 Other allergic rhinitis: Secondary | ICD-10-CM | POA: Diagnosis not present

## 2022-01-19 DIAGNOSIS — G8929 Other chronic pain: Secondary | ICD-10-CM | POA: Diagnosis not present

## 2022-01-19 DIAGNOSIS — M25562 Pain in left knee: Secondary | ICD-10-CM

## 2022-01-19 DIAGNOSIS — R600 Localized edema: Secondary | ICD-10-CM | POA: Diagnosis not present

## 2022-01-19 DIAGNOSIS — I482 Chronic atrial fibrillation, unspecified: Secondary | ICD-10-CM

## 2022-01-19 DIAGNOSIS — Z96641 Presence of right artificial hip joint: Secondary | ICD-10-CM

## 2022-01-19 DIAGNOSIS — E1159 Type 2 diabetes mellitus with other circulatory complications: Secondary | ICD-10-CM

## 2022-01-19 DIAGNOSIS — Z7901 Long term (current) use of anticoagulants: Secondary | ICD-10-CM

## 2022-01-19 MED ORDER — APIXABAN 2.5 MG PO TABS: 2.5000 mg | ORAL_TABLET | Freq: Two times a day (BID) | ORAL | 3 refills | Status: DC

## 2022-01-19 MED ORDER — MONTELUKAST SODIUM 10 MG PO TABS: 10.0000 mg | ORAL_TABLET | Freq: Every day | ORAL | 3 refills | Status: DC

## 2022-01-19 NOTE — Progress Notes (Cosign Needed)
[facility] [place], [place] [address]  Internal MEDICINE  Office Visit Note  Patient Name: Monica [name]  [id number]  [id number]  Date of Service: 01/19/2022  Chief Complaint  Patient presents with   Follow-up    Discuss meds   Diabetes   Hyperlipidemia   Hypertension   COPD    HPI Keelan presents for a follow up visit for diabetes, hypertension. She has a cough and congestion today. She also has been having left knee pain and left ankle swelling. She believes that he cough and congestion is most likely due to seasonal allergies.  Her A1C is 7.1 today which is improved from 7.4 in December. Glucose levels are stable at home per patient report.  She had routine labs ordered at her annual wellness visit because she requested to have them done. Today, she has decided that if the labs are not necessary then she would like to not have them done. Patient was informed that labs can be done on an as needed basis if there is a problem.  Left ankle and left knee have been aching and swelling off and on and this seems to be related to the patient relying on her left leg more due to her right hip hurting due to complications after her right hip replacement. Patient takes pain medication as needed. She does not need additional medication. Previously, she has refused an orthopedic referral for a second opinion or referral for physical therapy.     Current Medication: Outpatient Encounter Medications as of 01/19/2022  Medication Sig   albuterol (VENTOLIN HFA) 108 (90 Base) MCG/ACT inhaler INHALE 2 PUFFS INTO LUNGS EVERY 4 HOURS AS NEEDED FOR WHEEZING OR SHORTNESS OF BREATH   amLODipine-benazepril (LOTREL) 10-20 MG capsule TAKE 1 CAPSULE BY MOUTH DAILY.   clotrimazole-betamethasone (LOTRISONE) cream Apply 1 application topically 2 (two) times daily.   feeding supplement (BOOST HIGH PROTEIN) LIQD Take 1 Container by mouth as needed.   Ferrous Sulfate (IRON) 28 MG TABS Take 1  tablet (28 mg total) by mouth daily. Or any iron supplements over-the-counter.   fluticasone furoate-vilanterol (BREO ELLIPTA) 100-25 MCG/INH AEPB INHALE ONE PUFF EVERY DAY   gabapentin (NEURONTIN) 300 MG capsule TAKE 2 CAPSULES BY MOUTH AT BEDTIME   glucose blood (ONETOUCH ULTRA) test strip 1 each by Other route daily. Use as instructed   hydrochlorothiazide (HYDRODIURIL) 25 MG tablet Take 1 tablet (25 mg total) by mouth daily.   meloxicam (MOBIC) 7.5 MG tablet Take 1 tablet (7.5 mg total) by mouth daily.   metFORMIN (GLUCOPHAGE-XR) 500 MG 24 hr tablet TAKE 1 TABLET BY MOUTH 2 TIMES DAILY WITH A MEAL.   montelukast (SINGULAIR) 10 MG tablet Take 1 tablet (10 mg total) by mouth at bedtime.   nystatin cream (MYCOSTATIN) Apply topically 2 (two) times daily.   OneTouch Delica Lancets 37G MISC Use as directed once daily as needed   oxybutynin (DITROPAN-XL) 10 MG 24 hr tablet TAKE 1 TABLET BY MOUTH DAILY   senna-docusate (SENOKOT-S) 8.6-50 MG tablet Take 1 tablet by mouth at bedtime as needed for mild constipation.   Tdap (BOOSTRIX) 5-2.5-18.5 LF-MCG/0.5 injection Inject 0.5 mLs into the muscle once.   THEO-24 100 MG 24 hr capsule TAKE 1 CAPSULE BY MOUTH EVERY DAY   triamcinolone (KENALOG) 0.025 % cream Apply 1 application topically 2 (two) times daily. Ok to mix with topical lotion and apply to all affected areas.   [DISCONTINUED] desloratadine (CLARINEX) 5 MG tablet TAKE 1 TABLET  BY MOUTH DAILY   [DISCONTINUED] ELIQUIS 2.5 MG TABS tablet TAKE 1 TABLET BY MOUTH TWICE DAILY   apixaban (ELIQUIS) 2.5 MG TABS tablet Take 1 tablet (2.5 mg total) by mouth 2 (two) times daily.   [DISCONTINUED] famotidine (PEPCID) 20 MG tablet Take 1 tablet (20 mg total) by mouth 2 (two) times daily.   [DISCONTINUED] pravastatin (PRAVACHOL) 40 MG tablet Take 1 tablet (40 mg total) by mouth daily.   No facility-administered encounter medications on file as of 01/19/2022.    Surgical History: Past Surgical History:   Procedure Laterality Date   [place]   CAROTID ANGIOGRAPHY Right 11/23/2019   Procedure: CAROTID ANGIOGRAPHY;  Surgeon: Katha Cabal, MD;  Location: [facility];  Service: Cardiovascular;  Laterality: Right;   COLONOSCOPY     COLONOSCOPY WITH PROPOFOL N/A 01/25/2018   Procedure: COLONOSCOPY WITH PROPOFOL;  Surgeon: [name], MD;  Location: [facility] ENDOSCOPY;  Service: Endoscopy;  Laterality: N/A;   ESOPHAGOGASTRODUODENOSCOPY (EGD) WITH PROPOFOL N/A 01/24/2018   Procedure: ESOPHAGOGASTRODUODENOSCOPY (EGD) WITH PROPOFOL;  Surgeon: [name], MD;  Location: [facility] ENDOSCOPY;  Service: Endoscopy;  Laterality: N/A;   HIP ARTHROPLASTY Left 06/03/2019   Procedure: ARTHROPLASTY BIPOLAR HIP (HEMIARTHROPLASTY);  Surgeon: Dereck Leep, MD;  Location: [facility];  Service: Orthopedics;  Laterality: Left;   HIP ARTHROPLASTY Right 04/21/2020   Procedure: ARTHROPLASTY BIPOLAR HIP (HEMIARTHROPLASTY);  Surgeon: Corky Mull, MD;  Location: [facility];  Service: Orthopedics;  Laterality: Right;    Medical History: Past Medical History:  Diagnosis Date   Atrial fibrillation (HCC)    Bronchitis    Cancer (HCC)    Left leg growth, kidneys, lungs and breasts   Carcinoma of unknown primary ([place])    COPD (chronic obstructive pulmonary disease) ([place])    Diabetes mellitus without complication ([place])    Hyperlipidemia    Hypertension    Pancreatitis    Pneumonia    Stroke ([place])    TIA's   Vitamin B12 deficiency     Family History: Family History  Problem Relation Age of Onset   Cancer Father    Diabetes Brother     Social History   Socioeconomic History   Marital status: Divorced    Spouse name: Not on file   Number of children: Not on file   Years of education: Not on file   Highest education level: Not on file  Occupational History   Not on file  Tobacco Use   Smoking status: Former    Packs/day: 0.50    Years: 15.00    Pack  years: 7.50    Types: Cigarettes    Quit date: 11/15/1988    Years since quitting: 33.2   Smokeless tobacco: Never  Vaping Use   Vaping Use: Never used  Substance and Sexual Activity   Alcohol use: No    Alcohol/week: 0.0 standard drinks   Drug use: No   Sexual activity: Not on file    Comment: hysterectomy   Other Topics Concern   Not on file  Social History Narrative   Not on file   Social Determinants of Health   Financial Resource Strain: Not on file  Food Insecurity: Not on file  Transportation Needs: Not on file  Physical Activity: Not on file  Stress: Not on file  Social Connections: Not on file  Intimate Partner Violence: Not on file      Review of Systems  Constitutional:  Negative  for chills, fatigue and unexpected weight change.  HENT:  Negative for congestion, rhinorrhea, sneezing and sore throat.   Eyes:  Negative for redness.  Respiratory:  Negative for cough, chest tightness and shortness of breath.   Cardiovascular:  Negative for chest pain and palpitations.  Gastrointestinal:  Negative for abdominal pain, constipation, diarrhea, nausea and vomiting.  Genitourinary:  Negative for dysuria and frequency.  Musculoskeletal:  Positive for arthralgias. Negative for back pain, joint swelling and neck pain.  Skin:  Negative for rash.  Neurological: Negative.  Negative for tremors and numbness.  Hematological:  Negative for adenopathy. Does not bruise/bleed easily.  Psychiatric/Behavioral:  Negative for behavioral problems (Depression), sleep disturbance and suicidal ideas. The patient is not nervous/anxious.    Vital Signs: BP (!) 148/68    Pulse 100    Temp 98.6 F (37 C)    Resp 16    Ht '5\' 4"'$  (1.626 m)    Wt 150 lb 6.4 oz (68.2 kg)    SpO2 96%    BMI 25.82 kg/m    Physical Exam Vitals reviewed.  Constitutional:      General: She is not in acute distress.    Appearance: Normal appearance. She is normal weight. She is not ill-appearing.  HENT:     Head:  Normocephalic and atraumatic.  Eyes:     Pupils: Pupils are equal, round, and reactive to light.  Cardiovascular:     Rate and Rhythm: Normal rate and regular rhythm.  Pulmonary:     Effort: Pulmonary effort is normal. No respiratory distress.  Neurological:     Mental Status: She is alert and oriented to person, place, and time.  Psychiatric:        Mood and Affect: Mood normal.        Behavior: Behavior normal.       Assessment/Plan: 1. Type 2 diabetes mellitus with vascular disease (HCC) A1C continues to improve, is 7.1 today. No change to current medications or treatment regimen. Continue to follow ADA diet recommendations. Repeat A1C in 3 months.  - POCT HgB A1C  2. Non-seasonal allergic rhinitis due to other allergic trigger Discontinue clarinex, montelukast prescribed for allergic rhinitis symptom control.  - montelukast (SINGULAIR) 10 MG tablet; Take 1 tablet (10 mg total) by mouth at bedtime.  Dispense: 30 tablet; Refill: 3  3. Edema of left lower extremity Swelling on left leg due to increased use and dependence on this leg. She has been favoring the left leg due to chronic right hip pain. Encouraged to elevate the left leg when possible  4. Chronic pain of left knee Left knee pain is also due to patient favoring the left leg due to right hip pain. Manageable with current medications and OTC pain relief. Encouraged patient to rest, elevate and ice the knee as needed. Patient is not interested in seeing orthopedic specialist or physical therapy at this time.   5. S/P hip replacement, right Chronic right hip pain, manageable. Favoring left leg at times which is causing pain in the left knee and swelling in the left leg and left foot. Patient is not interested in seeing another orthopedic specialist or physical therapy at this time.   6. Atrial fibrillation, chronic (HCC) Refills ordered, takes eliquis for prevention of blood clots due to increased risk of developing blood  clots due to atrial fibrillation. Is also followed by cardiology. - apixaban (ELIQUIS) 2.5 MG TABS tablet; Take 1 tablet (2.5 mg total) by mouth 2 (two) times daily.  Dispense: 60 tablet; Refill: 3   General Counseling: Jacqulin verbalizes understanding of the findings of todays visit and agrees with plan of treatment. I have discussed any further diagnostic evaluation that may be needed or ordered today. We also reviewed her medications today. she has been encouraged to call the office with any questions or concerns that should arise related to todays visit.    Orders Placed This Encounter  Procedures   POCT HgB A1C    Meds ordered this encounter  Medications   apixaban (ELIQUIS) 2.5 MG TABS tablet    Sig: Take 1 tablet (2.5 mg total) by mouth 2 (two) times daily.    Dispense:  60 tablet    Refill:  3    FOR NEXT FILL MEDSYNC PATIENT   montelukast (SINGULAIR) 10 MG tablet    Sig: Take 1 tablet (10 mg total) by mouth at bedtime.    Dispense:  30 tablet    Refill:  3    Return in about 3 months (around 04/21/2022) for F/U, Recheck A1C, Shalina Norfolk PCP.   Total time spent:30 Minutes Time spent includes review of chart, medications, test results, and follow up plan with the patient.   [place] Controlled Substance Database was reviewed by me.  This patient was seen by Jonetta Osgood, FNP-C in collaboration with Dr. Clayborn Bigness as a part of collaborative care agreement.   Verbie Babic R. Valetta Fuller, MSN, FNP-C Internal medicine

## 2022-01-20 ENCOUNTER — Encounter: Payer: Self-pay | Admitting: Nurse Practitioner

## 2022-01-20 LAB — POCT GLYCOSYLATED HEMOGLOBIN (HGB A1C): Hemoglobin A1C: 7.1 % — AB (ref 4.0–5.6)

## 2022-02-05 ENCOUNTER — Encounter: Payer: Self-pay | Admitting: Intensive Care

## 2022-02-05 ENCOUNTER — Other Ambulatory Visit: Payer: Self-pay

## 2022-02-05 ENCOUNTER — Emergency Department
Admission: EM | Admit: 2022-02-05 | Discharge: 2022-02-05 | Disposition: A | Discharge status: Home / Self Care | Payer: Medicare Other | Attending: Emergency Medicine | Admitting: Emergency Medicine

## 2022-02-05 ENCOUNTER — Emergency Department: Payer: Medicare Other

## 2022-02-05 DIAGNOSIS — R0602 Shortness of breath: Secondary | ICD-10-CM | POA: Diagnosis not present

## 2022-02-05 DIAGNOSIS — R7309 Other abnormal glucose: Secondary | ICD-10-CM | POA: Insufficient documentation

## 2022-02-05 DIAGNOSIS — R0789 Other chest pain: Secondary | ICD-10-CM | POA: Diagnosis not present

## 2022-02-05 DIAGNOSIS — R55 Syncope and collapse: Secondary | ICD-10-CM | POA: Diagnosis not present

## 2022-02-05 DIAGNOSIS — I10 Essential (primary) hypertension: Secondary | ICD-10-CM | POA: Diagnosis not present

## 2022-02-05 DIAGNOSIS — R079 Chest pain, unspecified: Secondary | ICD-10-CM | POA: Diagnosis not present

## 2022-02-05 DIAGNOSIS — R42 Dizziness and giddiness: Secondary | ICD-10-CM | POA: Diagnosis not present

## 2022-02-05 DIAGNOSIS — J449 Chronic obstructive pulmonary disease, unspecified: Secondary | ICD-10-CM | POA: Diagnosis not present

## 2022-02-05 LAB — CBC
HCT: 32.8 % — ABNORMAL LOW (ref 36.0–46.0)
Hemoglobin: 10 g/dL — ABNORMAL LOW (ref 12.0–15.0)
MCH: 23.3 pg — ABNORMAL LOW (ref 26.0–34.0)
MCHC: 30.5 g/dL (ref 30.0–36.0)
MCV: 76.5 fL — ABNORMAL LOW (ref 80.0–100.0)
Platelets: 318 10*3/uL (ref 150–400)
RBC: 4.29 MIL/uL (ref 3.87–5.11)
RDW: 15.7 % — ABNORMAL HIGH (ref 11.5–15.5)
WBC: 9.3 10*3/uL (ref 4.0–10.5)
nRBC: 0 % (ref 0.0–0.2)

## 2022-02-05 LAB — TROPONIN I (HIGH SENSITIVITY)
Troponin I (High Sensitivity): 7 ng/L (ref <18)
Troponin I (High Sensitivity): 8 ng/L (ref <18)

## 2022-02-05 LAB — BASIC METABOLIC PANEL
Anion gap: 9 (ref 5–15)
BUN: 18 mg/dL (ref 8–23)
CO2: 26 mmol/L (ref 22–32)
Calcium: 9.5 mg/dL (ref 8.9–10.3)
Chloride: 99 mmol/L (ref 98–111)
Creatinine, Ser: 0.83 mg/dL (ref 0.44–1.00)
GFR, Estimated: >60 mL/min (ref >60)
Glucose, Bld: 147 mg/dL — ABNORMAL HIGH (ref 70–99)
Potassium: 4.6 mmol/L (ref 3.5–5.1)
Sodium: 134 mmol/L — ABNORMAL LOW (ref 135–145)

## 2022-02-05 LAB — PROTIME-INR
INR: 1.2 (ref 0.8–1.2)
Prothrombin Time: 14.8 seconds (ref 11.4–15.2)

## 2022-02-05 NOTE — ED Triage Notes (Signed)
Arrives via [facility] C/O near syncope today.  Patient c/o persistent dizziness x 1 hour.  Arrives from [place]  ? ?160/90 ?NSR 90 ?92-94% RA ?

## 2022-02-05 NOTE — ED Triage Notes (Addendum)
Patient c/o chest pain and dizziness that started about an hour ago. Radiation to left arm. Patient reports she always feels SOB but today it has worsened with exertion ?

## 2022-02-05 NOTE — ED Provider Notes (Signed)
? ?[facility] ?Provider Note ? ? ? Event Date/Time  ? First MD Initiated Contact with Patient 02/05/22 1748   ?  (approximate) ? ? ?History  ? ?Chief Complaint ?Chest Pain and Dizziness ? ? ?HPI ?Monica [name] is a 88 y.o. female, history of COPD, hyperlipidemia, atrial fibrillation, pancreatitis, stroke, hypertension, presents to the emergency department for evaluation of chest pain.  Patient states that she was walking about 2 hours ago when she felt a sudden onset of chest pain along the left side of her chest that radiated into her left shoulder.  Reported more shortness of breath than usual.  She states that her symptoms have mostly resolved now, but continues to feel " not right" in her chest.  Denies abdominal pain, fever/chills, cough, congestion, urinary symptoms, back pain, nausea/vomiting, rashes, or headache. ? ?History Limitations: No limitations. ? ?  ? ? ?Physical Exam  ?Triage Vital Signs: ?ED Triage Vitals  ?Enc Vitals Group  ?   BP 02/05/22 1545 (!) 153/60  ?   Pulse Rate 02/05/22 1545 83  ?   Resp 02/05/22 1545 (!) 22  ?   Temp 02/05/22 1545 98.4 ?F (36.9 ?C)  ?   Temp Source 02/05/22 1545 Oral  ?   SpO2 02/05/22 1545 92 %  ?   Weight 02/05/22 1546 150 lb (68 kg)  ?   Height 02/05/22 1546 '5\' 4"'$  (1.626 m)  ?   Head Circumference --   ?   Peak Flow --   ?   Pain Score 02/05/22 1545 5  ?   Pain Loc --   ?   Pain Edu? --   ?   Excl. in [place]? --   ? ? ?Most recent vital signs: ?Vitals:  ? 02/05/22 1845 02/05/22 1917  ?BP:  (!) 162/78  ?Pulse:  88  ?Resp:  20  ?Temp:    ?SpO2: 99% 97%  ? ? ?General: Awake, NAD.  ?Skin: Warm, dry.  ?CV: Good peripheral perfusion.  S1-S2 present.  No murmurs, rubs, gallops. ?Resp: Normal effort.  Lung sounds clear bilaterally in the apices and bases. ?Abd: Soft, non-tender. No distention.  ?Neuro: At baseline. No gross neurological deficits.  ?Other: None. ? ?Physical Exam ? ? ? ?ED Results / Procedures / Treatments  ?Labs ?(all labs ordered are  listed, but only abnormal results are displayed) ?Labs Reviewed  ?BASIC METABOLIC PANEL - Abnormal; Notable for the following components:  ?    Result Value  ? Sodium 134 (*)   ? Glucose, Bld 147 (*)   ? All other components within normal limits  ?CBC - Abnormal; Notable for the following components:  ? Hemoglobin 10.0 (*)   ? HCT 32.8 (*)   ? MCV 76.5 (*)   ? MCH 23.3 (*)   ? RDW 15.7 (*)   ? All other components within normal limits  ?PROTIME-INR  ?TROPONIN I (HIGH SENSITIVITY)  ?TROPONIN I (HIGH SENSITIVITY)  ? ? ? ?EKG ?Sinus rhythm, rate of 81, PACs present, no axis deviations, no ST segment changes, no AV blocks.  Normal QRS interval.  ? ? ?RADIOLOGY ? ?ED Provider Interpretation:  ? ?DG Chest 2 View ? ?Result Date: 02/05/2022 ?CLINICAL DATA:  Chest pain and dizziness for 1 hour, pain radiates to left arm, short of breath EXAM: CHEST - 2 VIEW COMPARISON:  09/08/2020 FINDINGS: Frontal and lateral views of the chest demonstrate a stable cardiac silhouette. Chronic bibasilar scarring greatest at the left lung base.  No airspace disease, effusion, or pneumothorax. No acute bony abnormalities. IMPRESSION: 1. Chronic bibasilar scarring.  No acute airspace disease. Electronically Signed   By: Randa Ngo M.D.   On: 02/05/2022 16:17   ? ?PROCEDURES: ? ?Critical Care performed: None. ? ?Procedures ? ? ? ?MEDICATIONS ORDERED IN ED: ?Medications - No data to display ? ? ?IMPRESSION / MDM / ASSESSMENT AND PLAN / ED COURSE  ?I reviewed the triage vital signs and the nursing notes. ?             ?               ? ? ?Differential diagnosis includes, but is not limited to, ACS, myocarditis/pericarditis, CHF exacerbation, COPD exacerbation, pulmonary embolism.  ? ?ED Course ?Patient appears well.  Vital signs within normal limits.  NAD. ? ?CBC shows no leukocytosis or clinically significant anemia. ? ?BMP shows no electrolyte abnormalities.  Mildly elevated glucose at 147.  No evidence of kidney injury. ? ?Initial EKG  unremarkable.  Initial troponin 7.  Second troponin 8.  Unlikely ACS or myocarditis/pericarditis. ? ?Assessment/Plan ?Presentation consistent with stable angina.  EKG and troponin work-up reassuring for no evidence of NSTEMI/STEMI.  Patient is currently asymptomatic at this time.  We will plan to discharge this patient with cardiology follow-up. ? ?Considered admission for this patient, but given her stable presentation, reassuring work-up, and lack of symptoms, she is unlikely to benefit.  We will plan to discharge. ? ?Patient was provided with anticipatory guidance, return precautions, and educational material. Encouraged the patient to return to the emergency department at any time if they begin to experience any new or worsening symptoms.  ? ?  ? ? ?FINAL CLINICAL IMPRESSION(S) / ED DIAGNOSES  ? ?Final diagnoses:  ?Chest pain, unspecified type  ? ? ? ?Rx / DC Orders  ? ?ED Discharge Orders   ? ? None  ? ?  ? ? ? ?Note:  This document was prepared using Dragon voice recognition software and may include unintentional dictation errors. ?  ?Teodoro Spray, [place] ?02/05/22 2014 ? ?  ?Nance Pear, MD ?02/05/22 2159 ? ?

## 2022-02-05 NOTE — Discharge Instructions (Addendum)
-  Follow-up with the cardiologist listed above, as discussed. ?-Return to the emergency department anytime if you begin to experience any new or worsening symptoms. ?-Follow-up with your primary care provider as needed. ?

## 2022-02-08 ENCOUNTER — Telehealth: Payer: Self-pay | Admitting: Nurse Practitioner

## 2022-02-08 NOTE — Chronic Care Management (AMB) (Signed)
?  Chronic Care Management  ? ?Note ? ?02/08/2022 ?Name: DARIS ARISTIZABAL MRN: [id number] DOB: 1926/08/13 ? ?VEDIKA DUMLAO is a 88 y.o. year old female who is a primary care patient of Jonetta Osgood, NP. I reached out to Valaria Good by phone today in response to a referral sent by Ms. Benjiman Core Antosh's PCP, Jonetta Osgood, NP.  ? ?Ms. Wooldridge was given information about Chronic Care Management services today including:  ?CCM service includes personalized support from designated clinical staff supervised by her physician, including individualized plan of care and coordination with other care providers ?24/7 contact phone numbers for assistance for urgent and routine care needs. ?Service will only be billed when office clinical staff spend 20 minutes or more in a month to coordinate care. ?Only one practitioner may furnish and bill the service in a calendar month. ?The patient may stop CCM services at any time (effective at the end of the month) by phone call to the office staff. ? ? ?Patient agreed to services and verbal consent obtained.  ? ?Follow up plan: ? ? ?Tatjana Dellinger ?Upstream Scheduler  ?

## 2022-02-09 ENCOUNTER — Telehealth: Payer: Self-pay

## 2022-02-09 NOTE — Telephone Encounter (Signed)
Per [name], patient has ED follow up appointment with cardiologist-Toni ?

## 2022-02-09 NOTE — Telephone Encounter (Signed)
Lvm with daughter, [name], to return call to scheduled ED follow up-Toni ?

## 2022-02-10 DIAGNOSIS — R600 Localized edema: Secondary | ICD-10-CM | POA: Diagnosis not present

## 2022-02-10 DIAGNOSIS — I10 Essential (primary) hypertension: Secondary | ICD-10-CM | POA: Diagnosis not present

## 2022-02-10 DIAGNOSIS — G2581 Restless legs syndrome: Secondary | ICD-10-CM | POA: Diagnosis not present

## 2022-02-10 DIAGNOSIS — I480 Paroxysmal atrial fibrillation: Secondary | ICD-10-CM | POA: Diagnosis not present

## 2022-02-16 ENCOUNTER — Other Ambulatory Visit: Payer: Self-pay | Admitting: Internal Medicine

## 2022-02-17 ENCOUNTER — Telehealth: Payer: Medicare Other

## 2022-03-02 DIAGNOSIS — I480 Paroxysmal atrial fibrillation: Secondary | ICD-10-CM | POA: Diagnosis not present

## 2022-03-02 DIAGNOSIS — C4922 Malignant neoplasm of connective and soft tissue of left lower limb, including hip: Secondary | ICD-10-CM | POA: Diagnosis not present

## 2022-03-02 DIAGNOSIS — J440 Chronic obstructive pulmonary disease with acute lower respiratory infection: Secondary | ICD-10-CM | POA: Diagnosis not present

## 2022-03-02 DIAGNOSIS — J209 Acute bronchitis, unspecified: Secondary | ICD-10-CM | POA: Diagnosis not present

## 2022-03-02 DIAGNOSIS — R600 Localized edema: Secondary | ICD-10-CM | POA: Diagnosis not present

## 2022-03-02 DIAGNOSIS — I6782 Cerebral ischemia: Secondary | ICD-10-CM | POA: Diagnosis not present

## 2022-03-02 DIAGNOSIS — Z86718 Personal history of other venous thrombosis and embolism: Secondary | ICD-10-CM | POA: Diagnosis not present

## 2022-03-02 DIAGNOSIS — I10 Essential (primary) hypertension: Secondary | ICD-10-CM | POA: Diagnosis not present

## 2022-03-02 DIAGNOSIS — E1165 Type 2 diabetes mellitus with hyperglycemia: Secondary | ICD-10-CM | POA: Diagnosis not present

## 2022-03-18 ENCOUNTER — Other Ambulatory Visit: Payer: Self-pay | Admitting: Nurse Practitioner

## 2022-03-18 DIAGNOSIS — E1159 Type 2 diabetes mellitus with other circulatory complications: Secondary | ICD-10-CM

## 2022-03-18 DIAGNOSIS — I10 Essential (primary) hypertension: Secondary | ICD-10-CM

## 2022-04-18 ENCOUNTER — Other Ambulatory Visit: Payer: Self-pay | Admitting: Nurse Practitioner

## 2022-04-18 DIAGNOSIS — R350 Frequency of micturition: Secondary | ICD-10-CM

## 2022-04-20 ENCOUNTER — Encounter: Payer: Self-pay | Admitting: Nurse Practitioner

## 2022-04-20 ENCOUNTER — Ambulatory Visit (INDEPENDENT_AMBULATORY_CARE_PROVIDER_SITE_OTHER): Payer: Medicare Other | Admitting: Nurse Practitioner

## 2022-04-20 VITALS — BP 128/68 | HR 70 | Temp 98.7°F | Resp 16 | Ht 64.0 in | Wt 149.4 lb

## 2022-04-20 DIAGNOSIS — E1159 Type 2 diabetes mellitus with other circulatory complications: Secondary | ICD-10-CM

## 2022-04-20 DIAGNOSIS — I10 Essential (primary) hypertension: Secondary | ICD-10-CM | POA: Diagnosis not present

## 2022-04-20 DIAGNOSIS — E782 Mixed hyperlipidemia: Secondary | ICD-10-CM | POA: Diagnosis not present

## 2022-04-20 LAB — POCT GLYCOSYLATED HEMOGLOBIN (HGB A1C): Hemoglobin A1C: 7.7 % — AB (ref 4.0–5.6)

## 2022-04-20 NOTE — Progress Notes (Signed)
[facility] [place], [place] [address]  Internal MEDICINE  Office Visit Note  Patient Name: Monica [name]  [id number]  [id number]  Date of Service: 04/20/2022  Chief Complaint  Patient presents with  . Follow-up    2 weeks ago dropped can on top of right foot, looks and feels better now though   . Diabetes  . Hyperlipidemia  . Hypertension  . COPD    HPI Jissell presents for a follow-up visit for Right foot is better now A1c 7.7 eating more ice  cream lately.      Current Medication: Outpatient Encounter Medications as of 04/20/2022  Medication Sig  . albuterol (VENTOLIN HFA) 108 (90 Base) MCG/ACT inhaler INHALE 2 PUFFS INTO LUNGS EVERY 4 HOURS AS NEEDED FOR WHEEZING OR SHORTNESS OF BREATH  . amLODipine-benazepril (LOTREL) 10-20 MG capsule TAKE 1 CAPSULE BY MOUTH DAILY.  Marland Kitchen apixaban (ELIQUIS) 2.5 MG TABS tablet Take 1 tablet (2.5 mg total) by mouth 2 (two) times daily.  . clotrimazole-betamethasone (LOTRISONE) cream Apply 1 application topically 2 (two) times daily.  . feeding supplement (BOOST HIGH PROTEIN) LIQD Take 1 Container by mouth as needed.  . Ferrous Sulfate (IRON) 28 MG TABS Take 1 tablet (28 mg total) by mouth daily. Or any iron supplements over-the-counter.  . fluticasone furoate-vilanterol (BREO ELLIPTA) 100-25 MCG/INH AEPB INHALE ONE PUFF EVERY DAY  . gabapentin (NEURONTIN) 300 MG capsule TAKE 2 CAPSULES BY MOUTH AT BEDTIME  . glucose blood (ONETOUCH ULTRA) test strip 1 each by Other route daily. Use as instructed  . hydrochlorothiazide (HYDRODIURIL) 25 MG tablet Take 1 tablet (25 mg total) by mouth daily.  . meloxicam (MOBIC) 7.5 MG tablet Take 1 tablet (7.5 mg total) by mouth daily.  . metFORMIN (GLUCOPHAGE-XR) 500 MG 24 hr tablet TAKE ONE (1) TABLET BY MOUTH TWO TIMES PER DAY WITH A MEAL  . montelukast (SINGULAIR) 10 MG tablet Take 1 tablet (10 mg total) by mouth at bedtime.  Marland Kitchen nystatin cream (MYCOSTATIN) Apply topically 2 (two) times  daily.  Glory Rosebush Delica Lancets 77O MISC Use as directed once daily as needed  . oxybutynin (DITROPAN-XL) 10 MG 24 hr tablet TAKE 1 TABLET BY MOUTH DAILY  . senna-docusate (SENOKOT-S) 8.6-50 MG tablet Take 1 tablet by mouth at bedtime as needed for mild constipation.  . Tdap (BOOSTRIX) 5-2.5-18.5 LF-MCG/0.5 injection Inject 0.5 mLs into the muscle once.  Marland Kitchen THEO-24 100 MG 24 hr capsule TAKE 1 CAPSULE BY MOUTH EVERY DAY  . triamcinolone (KENALOG) 0.025 % cream Apply 1 application topically 2 (two) times daily. Ok to mix with topical lotion and apply to all affected areas.  . [DISCONTINUED] famotidine (PEPCID) 20 MG tablet Take 1 tablet (20 mg total) by mouth 2 (two) times daily.  . [DISCONTINUED] pravastatin (PRAVACHOL) 40 MG tablet Take 1 tablet (40 mg total) by mouth daily.   No facility-administered encounter medications on file as of 04/20/2022.    Surgical History: Past Surgical History:  Procedure Laterality Date  . ABDOMINAL HYSTERECTOMY  1975  . BLADDER SUSPENSION  1989  . CAROTID ANGIOGRAPHY Right 11/23/2019   Procedure: CAROTID ANGIOGRAPHY;  Surgeon: Katha Cabal, MD;  Location: [place] [facility];  Service: Cardiovascular;  Laterality: Right;  . COLONOSCOPY    . COLONOSCOPY WITH PROPOFOL N/A 01/25/2018   Procedure: COLONOSCOPY WITH PROPOFOL;  Surgeon: [name], MD;  Location: [facility] ENDOSCOPY;  Service: Endoscopy;  Laterality: N/A;  . ESOPHAGOGASTRODUODENOSCOPY (EGD) WITH PROPOFOL N/A 01/24/2018   Procedure:  ESOPHAGOGASTRODUODENOSCOPY (EGD) WITH PROPOFOL;  Surgeon: [name], MD;  Location: [facility] ENDOSCOPY;  Service: Endoscopy;  Laterality: N/A;  . HIP ARTHROPLASTY Left 06/03/2019   Procedure: ARTHROPLASTY BIPOLAR HIP (HEMIARTHROPLASTY);  Surgeon: Dereck Leep, MD;  Location: [facility] ORS;  Service: Orthopedics;  Laterality: Left;  . HIP ARTHROPLASTY Right 04/21/2020   Procedure: ARTHROPLASTY BIPOLAR HIP (HEMIARTHROPLASTY);  Surgeon: Corky Mull, MD;  Location: [facility] ORS;   Service: Orthopedics;  Laterality: Right;    Medical History: Past Medical History:  Diagnosis Date  . Atrial fibrillation ([place])   . Bronchitis   . Cancer ([place])    Left leg growth, kidneys, lungs and breasts  . Carcinoma of unknown primary ([place])   . COPD (chronic obstructive pulmonary disease) ([place])   . Diabetes mellitus without complication ([place])   . Hyperlipidemia   . Hypertension   . Pancreatitis   . Pneumonia   . Stroke ([place])    TIA's  . Vitamin B12 deficiency     Family History: Family History  Problem Relation Age of Onset  . Cancer Father   . Diabetes Brother     Social History   Socioeconomic History  . Marital status: Divorced    Spouse name: Not on file  . Number of children: Not on file  . Years of education: Not on file  . Highest education level: Not on file  Occupational History  . Not on file  Tobacco Use  . Smoking status: Former    Packs/day: 0.50    Years: 15.00    Pack years: 7.50    Types: Cigarettes    Quit date: 11/15/1988    Years since quitting: 33.4  . Smokeless tobacco: Never  Vaping Use  . Vaping Use: Never used  Substance and Sexual Activity  . Alcohol use: No    Alcohol/week: 0.0 standard drinks  . Drug use: No  . Sexual activity: Not on file    Comment: hysterectomy   Other Topics Concern  . Not on file  Social History Narrative  . Not on file   Social Determinants of Health   Financial Resource Strain: Not on file  Food Insecurity: Not on file  Transportation Needs: Not on file  Physical Activity: Not on file  Stress: Not on file  Social Connections: Not on file  Intimate Partner Violence: Not on file      Review of Systems  Vital Signs: BP 128/68   Pulse 70   Temp 98.7 F (37.1 C)   Resp 16   Ht '5\' 4"'$  (1.626 m)   Wt 149 lb 6.4 oz (67.8 kg)   SpO2 98%   BMI 25.64 kg/m    Physical Exam     Assessment/Plan:   General Counseling: Lowana verbalizes understanding of the findings of todays visit and  agrees with plan of treatment. I have discussed any further diagnostic evaluation that may be needed or ordered today. We also reviewed her medications today. she has been encouraged to call the office with any questions or concerns that should arise related to todays visit.    Orders Placed This Encounter  Procedures  . POCT HgB A1C    No orders of the defined types were placed in this encounter.   Return in about 3 months (around 07/21/2022) for F/U, Recheck A1C, Treyshaun Keatts PCP.   Total time spent:*** Minutes Time spent includes review of chart, medications, test results, and follow up plan with the patient.   [place] Controlled Substance Database was reviewed  by me.  This patient was seen by Jonetta Osgood, FNP-C in collaboration with Dr. Clayborn Bigness as a part of collaborative care agreement.   Trig Mcbryar R. Valetta Fuller, MSN, FNP-C Internal medicine

## 2022-04-26 ENCOUNTER — Other Ambulatory Visit: Payer: Self-pay | Admitting: Nurse Practitioner

## 2022-04-26 DIAGNOSIS — J3089 Other allergic rhinitis: Secondary | ICD-10-CM

## 2022-05-19 ENCOUNTER — Other Ambulatory Visit: Payer: Self-pay | Admitting: Nurse Practitioner

## 2022-06-01 DIAGNOSIS — Z86718 Personal history of other venous thrombosis and embolism: Secondary | ICD-10-CM | POA: Diagnosis not present

## 2022-06-01 DIAGNOSIS — I480 Paroxysmal atrial fibrillation: Secondary | ICD-10-CM | POA: Diagnosis not present

## 2022-06-01 DIAGNOSIS — R600 Localized edema: Secondary | ICD-10-CM | POA: Diagnosis not present

## 2022-06-01 DIAGNOSIS — I6782 Cerebral ischemia: Secondary | ICD-10-CM | POA: Diagnosis not present

## 2022-06-01 DIAGNOSIS — I10 Essential (primary) hypertension: Secondary | ICD-10-CM | POA: Diagnosis not present

## 2022-06-01 DIAGNOSIS — J440 Chronic obstructive pulmonary disease with acute lower respiratory infection: Secondary | ICD-10-CM | POA: Diagnosis not present

## 2022-06-01 DIAGNOSIS — E1165 Type 2 diabetes mellitus with hyperglycemia: Secondary | ICD-10-CM | POA: Diagnosis not present

## 2022-06-01 DIAGNOSIS — G2581 Restless legs syndrome: Secondary | ICD-10-CM | POA: Diagnosis not present

## 2022-06-01 DIAGNOSIS — C4922 Malignant neoplasm of connective and soft tissue of left lower limb, including hip: Secondary | ICD-10-CM | POA: Diagnosis not present

## 2022-06-01 DIAGNOSIS — J209 Acute bronchitis, unspecified: Secondary | ICD-10-CM | POA: Diagnosis not present

## 2022-06-17 ENCOUNTER — Other Ambulatory Visit: Payer: Self-pay | Admitting: Nurse Practitioner

## 2022-06-17 DIAGNOSIS — I10 Essential (primary) hypertension: Secondary | ICD-10-CM

## 2022-07-19 ENCOUNTER — Other Ambulatory Visit: Payer: Self-pay | Admitting: Nurse Practitioner

## 2022-07-19 DIAGNOSIS — E1159 Type 2 diabetes mellitus with other circulatory complications: Secondary | ICD-10-CM

## 2022-07-19 DIAGNOSIS — I482 Chronic atrial fibrillation, unspecified: Secondary | ICD-10-CM

## 2022-07-20 ENCOUNTER — Ambulatory Visit (INDEPENDENT_AMBULATORY_CARE_PROVIDER_SITE_OTHER): Payer: Medicare Other | Admitting: Nurse Practitioner

## 2022-07-20 ENCOUNTER — Encounter: Payer: Self-pay | Admitting: Nurse Practitioner

## 2022-07-20 VITALS — BP 136/67 | HR 67 | Temp 98.2°F | Resp 16 | Ht 64.0 in | Wt 148.2 lb

## 2022-07-20 DIAGNOSIS — J440 Chronic obstructive pulmonary disease with acute lower respiratory infection: Secondary | ICD-10-CM | POA: Diagnosis not present

## 2022-07-20 DIAGNOSIS — E1159 Type 2 diabetes mellitus with other circulatory complications: Secondary | ICD-10-CM | POA: Diagnosis not present

## 2022-07-20 DIAGNOSIS — I10 Essential (primary) hypertension: Secondary | ICD-10-CM | POA: Diagnosis not present

## 2022-07-20 DIAGNOSIS — J3089 Other allergic rhinitis: Secondary | ICD-10-CM

## 2022-07-20 DIAGNOSIS — J209 Acute bronchitis, unspecified: Secondary | ICD-10-CM

## 2022-07-20 LAB — POCT GLYCOSYLATED HEMOGLOBIN (HGB A1C): Hemoglobin A1C: 7.3 % — AB (ref 4.0–5.6)

## 2022-07-20 MED ORDER — ALBUTEROL SULFATE HFA 108 (90 BASE) MCG/ACT IN AERS: INHALATION_SPRAY | RESPIRATORY_TRACT | 3 refills | Status: DC

## 2022-07-20 NOTE — Progress Notes (Signed)
[facility] [place], [place] [address]  Internal MEDICINE  Office Visit Note  Patient Name: Monica [name]  [id number]  [id number]  Date of Service: 07/20/2022  Chief Complaint  Patient presents with  . Follow-up  . Quality Metric Gaps    Dexa scan  and Diabetic eye exam    HPI Monica [name] presents for follow-up visit for diabetes, hypertension, and allergic rhinitis. Diabetes --A1c is 7.3 today which is improved from 7.7 in June.  No changes were made to her medications at her previous office visit.  She reports that she has decreased the amount of ice cream she eats and her sugars have improved some.  She reports that she feels good and is not having any issues. Hypertension --blood pressure is well controlled with current medications, no issues. Allergic rhinitis and environmental allergies --patient was started on montelukast at her previous office visit and reports that her symptoms have improved and have not been bothering her as much.  Denies any adverse side effects of the medication. Denies having any symptoms of GERD and does not need famotidine per patient report.  Request that famotidine be removed from her medication list as she no longer wants to take this medication.     Current Medication: Outpatient Encounter Medications as of 07/20/2022  Medication Sig  . albuterol (VENTOLIN HFA) 108 (90 Base) MCG/ACT inhaler INHALE 2 PUFFS INTO LUNGS EVERY 4 HOURS AS NEEDED FOR WHEEZING OR SHORTNESS OF BREATH  . amLODipine-benazepril (LOTREL) 10-20 MG capsule TAKE 1 CAPSULE BY MOUTH DAILY.  . clotrimazole-betamethasone (LOTRISONE) cream Apply 1 application topically 2 (two) times daily.  Marland Kitchen desloratadine (CLARINEX) 5 MG tablet TAKE 1 TABLET BY MOUTH DAILY  . ELIQUIS 2.5 MG TABS tablet TAKE ONE TABLET BY MOUTH TWICE DAILY  . feeding supplement (BOOST HIGH PROTEIN) LIQD Take 1 Container by mouth as needed.  . Ferrous Sulfate (IRON) 28 MG TABS Take 1 tablet (28 mg  total) by mouth daily. Or any iron supplements over-the-counter.  . fluticasone furoate-vilanterol (BREO ELLIPTA) 100-25 MCG/INH AEPB INHALE ONE PUFF EVERY DAY  . gabapentin (NEURONTIN) 300 MG capsule TAKE 2 CAPSULES BY MOUTH AT BEDTIME  . glucose blood (ONETOUCH ULTRA) test strip 1 each by Other route daily. Use as instructed  . hydrochlorothiazide (HYDRODIURIL) 25 MG tablet Take 1 tablet (25 mg total) by mouth daily.  . meloxicam (MOBIC) 7.5 MG tablet Take 1 tablet (7.5 mg total) by mouth daily.  . metFORMIN (GLUCOPHAGE-XR) 500 MG 24 hr tablet TAKE ONE (1) TABLET BY MOUTH TWO TIMES PER DAY WITH A MEAL  . montelukast (SINGULAIR) 10 MG tablet TAKE ONE TABLET (10 MG) BY MOUTH AT BEDTIME  . nystatin cream (MYCOSTATIN) Apply topically 2 (two) times daily.  Glory Rosebush Delica Lancets 71I MISC Use as directed once daily as needed  . oxybutynin (DITROPAN-XL) 10 MG 24 hr tablet TAKE 1 TABLET BY MOUTH DAILY  . senna-docusate (SENOKOT-S) 8.6-50 MG tablet Take 1 tablet by mouth at bedtime as needed for mild constipation.  . Tdap (BOOSTRIX) 5-2.5-18.5 LF-MCG/0.5 injection Inject 0.5 mLs into the muscle once.  Marland Kitchen THEO-24 100 MG 24 hr capsule TAKE 1 CAPSULE BY MOUTH EVERY DAY  . triamcinolone (KENALOG) 0.025 % cream Apply 1 application topically 2 (two) times daily. Ok to mix with topical lotion and apply to all affected areas.  . [DISCONTINUED] famotidine (PEPCID) 20 MG tablet Take 1 tablet (20 mg total) by mouth 2 (two) times daily.  . [DISCONTINUED] pravastatin (PRAVACHOL)  40 MG tablet Take 1 tablet (40 mg total) by mouth daily.   No facility-administered encounter medications on file as of 07/20/2022.    Surgical History: Past Surgical History:  Procedure Laterality Date  . ABDOMINAL HYSTERECTOMY  1975  . BLADDER SUSPENSION  1989  . CAROTID ANGIOGRAPHY Right 11/23/2019   Procedure: CAROTID ANGIOGRAPHY;  Surgeon: Katha Cabal, MD;  Location: [place] [facility] LAB;  Service: Cardiovascular;   Laterality: Right;  . COLONOSCOPY    . COLONOSCOPY WITH PROPOFOL N/A 01/25/2018   Procedure: COLONOSCOPY WITH PROPOFOL;  Surgeon: [name], MD;  Location: [facility] ENDOSCOPY;  Service: Endoscopy;  Laterality: N/A;  . ESOPHAGOGASTRODUODENOSCOPY (EGD) WITH PROPOFOL N/A 01/24/2018   Procedure: ESOPHAGOGASTRODUODENOSCOPY (EGD) WITH PROPOFOL;  Surgeon: [name], MD;  Location: [facility] ENDOSCOPY;  Service: Endoscopy;  Laterality: N/A;  . HIP ARTHROPLASTY Left 06/03/2019   Procedure: ARTHROPLASTY BIPOLAR HIP (HEMIARTHROPLASTY);  Surgeon: Dereck Leep, MD;  Location: [facility] ORS;  Service: Orthopedics;  Laterality: Left;  . HIP ARTHROPLASTY Right 04/21/2020   Procedure: ARTHROPLASTY BIPOLAR HIP (HEMIARTHROPLASTY);  Surgeon: Corky Mull, MD;  Location: [facility] ORS;  Service: Orthopedics;  Laterality: Right;    Medical History: Past Medical History:  Diagnosis Date  . Atrial fibrillation ([place])   . Bronchitis   . Cancer ([place])    Left leg growth, kidneys, lungs and breasts  . Carcinoma of unknown primary ([place])   . COPD (chronic obstructive pulmonary disease) ([place])   . Diabetes mellitus without complication ([place])   . Hyperlipidemia   . Hypertension   . Pancreatitis   . Pneumonia   . Stroke ([place])    TIA's  . Vitamin B12 deficiency     Family History: Family History  Problem Relation Age of Onset  . Cancer Father   . Diabetes Brother     Social History   Socioeconomic History  . Marital status: Divorced    Spouse name: Not on file  . Number of children: Not on file  . Years of education: Not on file  . Highest education level: Not on file  Occupational History  . Not on file  Tobacco Use  . Smoking status: Former    Packs/day: 0.50    Years: 15.00    Total pack years: 7.50    Types: Cigarettes    Quit date: 11/15/1988    Years since quitting: 33.6  . Smokeless tobacco: Never  Vaping Use  . Vaping Use: Never used  Substance and Sexual Activity  . Alcohol use: No    Alcohol/week:  0.0 standard drinks of alcohol  . Drug use: No  . Sexual activity: Not on file    Comment: hysterectomy   Other Topics Concern  . Not on file  Social History Narrative  . Not on file   Social Determinants of Health   Financial Resource Strain: Not on file  Food Insecurity: Not on file  Transportation Needs: Not on file  Physical Activity: Not on file  Stress: Not on file  Social Connections: Not on file  Intimate Partner Violence: Not on file      Review of Systems  Vital Signs: BP 136/67   Pulse 67   Temp 98.2 F (36.8 C)   Resp 16   Ht '5\' 4"'$  (1.626 m)   Wt 148 lb 3.2 oz (67.2 kg)   SpO2 97%   BMI 25.44 kg/m    Physical Exam     Assessment/Plan:   General Counseling: Monica [name] verbalizes understanding of the  findings of todays visit and agrees with plan of treatment. I have discussed any further diagnostic evaluation that may be needed or ordered today. We also reviewed her medications today. she has been encouraged to call the office with any questions or concerns that should arise related to todays visit.    Orders Placed This Encounter  Procedures  . POCT HgB A1C    No orders of the defined types were placed in this encounter.   Return in about 3 months (around 10/19/2022) for F/U, Recheck A1C, Bernita Beckstrom PCP.   Total time spent:*** Minutes Time spent includes review of chart, medications, test results, and follow up plan with the patient.   [place] Controlled Substance Database was reviewed by me.  This patient was seen by Jonetta Osgood, FNP-C in collaboration with Dr. Clayborn Bigness as a part of collaborative care agreement.   Franz Svec R. Valetta Fuller, MSN, FNP-C Internal medicine

## 2022-07-21 ENCOUNTER — Encounter: Payer: Self-pay | Admitting: Nurse Practitioner

## 2022-08-19 ENCOUNTER — Other Ambulatory Visit: Payer: Self-pay | Admitting: Internal Medicine

## 2022-08-19 ENCOUNTER — Other Ambulatory Visit: Payer: Self-pay | Admitting: Nurse Practitioner

## 2022-08-19 DIAGNOSIS — R350 Frequency of micturition: Secondary | ICD-10-CM

## 2022-08-19 DIAGNOSIS — J3089 Other allergic rhinitis: Secondary | ICD-10-CM

## 2022-09-13 ENCOUNTER — Encounter (INDEPENDENT_AMBULATORY_CARE_PROVIDER_SITE_OTHER): Payer: Self-pay

## 2022-09-21 ENCOUNTER — Other Ambulatory Visit: Payer: Self-pay | Admitting: Nurse Practitioner

## 2022-09-21 DIAGNOSIS — I10 Essential (primary) hypertension: Secondary | ICD-10-CM

## 2022-09-29 DIAGNOSIS — C4922 Malignant neoplasm of connective and soft tissue of left lower limb, including hip: Secondary | ICD-10-CM | POA: Diagnosis not present

## 2022-09-29 DIAGNOSIS — R600 Localized edema: Secondary | ICD-10-CM | POA: Diagnosis not present

## 2022-09-29 DIAGNOSIS — I6782 Cerebral ischemia: Secondary | ICD-10-CM | POA: Diagnosis not present

## 2022-09-29 DIAGNOSIS — E1165 Type 2 diabetes mellitus with hyperglycemia: Secondary | ICD-10-CM | POA: Diagnosis not present

## 2022-09-29 DIAGNOSIS — J440 Chronic obstructive pulmonary disease with acute lower respiratory infection: Secondary | ICD-10-CM | POA: Diagnosis not present

## 2022-09-29 DIAGNOSIS — G2581 Restless legs syndrome: Secondary | ICD-10-CM | POA: Diagnosis not present

## 2022-09-29 DIAGNOSIS — I480 Paroxysmal atrial fibrillation: Secondary | ICD-10-CM | POA: Diagnosis not present

## 2022-09-29 DIAGNOSIS — J209 Acute bronchitis, unspecified: Secondary | ICD-10-CM | POA: Diagnosis not present

## 2022-09-29 DIAGNOSIS — Z86718 Personal history of other venous thrombosis and embolism: Secondary | ICD-10-CM | POA: Diagnosis not present

## 2022-09-29 DIAGNOSIS — I824Y9 Acute embolism and thrombosis of unspecified deep veins of unspecified proximal lower extremity: Secondary | ICD-10-CM | POA: Diagnosis not present

## 2022-09-29 DIAGNOSIS — I10 Essential (primary) hypertension: Secondary | ICD-10-CM | POA: Diagnosis not present

## 2022-10-19 ENCOUNTER — Encounter: Payer: Self-pay | Admitting: Nurse Practitioner

## 2022-10-19 ENCOUNTER — Ambulatory Visit (INDEPENDENT_AMBULATORY_CARE_PROVIDER_SITE_OTHER): Payer: Medicare Other | Admitting: Nurse Practitioner

## 2022-10-19 VITALS — BP 124/46 | HR 62 | Temp 96.8°F | Resp 16 | Ht 64.0 in | Wt 142.8 lb

## 2022-10-19 DIAGNOSIS — I10 Essential (primary) hypertension: Secondary | ICD-10-CM | POA: Diagnosis not present

## 2022-10-19 DIAGNOSIS — Z760 Encounter for issue of repeat prescription: Secondary | ICD-10-CM | POA: Diagnosis not present

## 2022-10-19 DIAGNOSIS — L209 Atopic dermatitis, unspecified: Secondary | ICD-10-CM

## 2022-10-19 DIAGNOSIS — E1159 Type 2 diabetes mellitus with other circulatory complications: Secondary | ICD-10-CM | POA: Diagnosis not present

## 2022-10-19 DIAGNOSIS — B372 Candidiasis of skin and nail: Secondary | ICD-10-CM | POA: Diagnosis not present

## 2022-10-19 LAB — POCT GLYCOSYLATED HEMOGLOBIN (HGB A1C): Hemoglobin A1C: 6.9 % — AB (ref 4.0–5.6)

## 2022-10-19 MED ORDER — THEO-24 100 MG PO CP24: 100.0000 mg | ORAL_CAPSULE | Freq: Every day | ORAL | 5 refills | Status: DC

## 2022-10-19 MED ORDER — NYSTATIN 100000 UNIT/GM EX POWD
1.0000 | Freq: Three times a day (TID) | CUTANEOUS | 0 refills | Status: DC
Start: 2022-10-19 — End: 2023-06-28

## 2022-10-19 MED ORDER — CLOTRIMAZOLE-BETAMETHASONE 1-0.05 % EX CREA: 1.0000 | TOPICAL_CREAM | Freq: Two times a day (BID) | CUTANEOUS | 1 refills | Status: DC

## 2022-10-19 NOTE — Progress Notes (Signed)
[facility] [place], [place] [address]  Internal MEDICINE  Office Visit Note  Patient Name: Monica [name]  [id number]  [id number]  Date of Service: 10/19/2022  Chief Complaint  Patient presents with   Follow-up   Hyperlipidemia   Hypertension   Diabetes    HPI Monica [name] presents for follow up visit for diabetes, rash and hypertension. Diabetes -- A1c 6.9, improved Sore rash on buttocks -- need topical reordered for yeast Hypertension -- DBP low but pt asymptomatic     Current Medication: Outpatient Encounter Medications as of 10/19/2022  Medication Sig   albuterol (VENTOLIN HFA) 108 (90 Base) MCG/ACT inhaler INHALE 2 PUFFS INTO LUNGS EVERY 4 HOURS AS NEEDED FOR WHEEZING OR SHORTNESS OF BREATH   amLODipine-benazepril (LOTREL) 10-20 MG capsule TAKE 1 CAPSULE BY MOUTH DAILY.   desloratadine (CLARINEX) 5 MG tablet TAKE 1 TABLET BY MOUTH DAILY   ELIQUIS 2.5 MG TABS tablet TAKE ONE TABLET BY MOUTH TWICE DAILY   feeding supplement (BOOST HIGH PROTEIN) LIQD Take 1 Container by mouth as needed.   Ferrous Sulfate (IRON) 28 MG TABS Take 1 tablet (28 mg total) by mouth daily. Or any iron supplements over-the-counter.   fluticasone furoate-vilanterol (BREO ELLIPTA) 100-25 MCG/INH AEPB INHALE ONE PUFF EVERY DAY   gabapentin (NEURONTIN) 300 MG capsule TAKE 2 CAPSULES BY MOUTH AT BEDTIME   glucose blood (ONETOUCH ULTRA) test strip 1 each by Other route daily. Use as instructed   hydrochlorothiazide (HYDRODIURIL) 25 MG tablet Take 1 tablet (25 mg total) by mouth daily.   meloxicam (MOBIC) 7.5 MG tablet Take 1 tablet (7.5 mg total) by mouth daily.   metFORMIN (GLUCOPHAGE-XR) 500 MG 24 hr tablet TAKE ONE (1) TABLET BY MOUTH TWO TIMES PER DAY WITH A MEAL   montelukast (SINGULAIR) 10 MG tablet TAKE ONE TABLET (10 MG) BY MOUTH AT BEDTIME   nystatin (MYCOSTATIN/NYSTOP) powder Apply 1 Application topically 3 (three) times daily. To groin area until rash resolves.   nystatin  cream (MYCOSTATIN) Apply topically 2 (two) times daily.   OneTouch Delica Lancets 53Z MISC Use as directed once daily as needed   oxybutynin (DITROPAN-XL) 10 MG 24 hr tablet TAKE 1 TABLET BY MOUTH DAILY   senna-docusate (SENOKOT-S) 8.6-50 MG tablet Take 1 tablet by mouth at bedtime as needed for mild constipation.   Tdap (BOOSTRIX) 5-2.5-18.5 LF-MCG/0.5 injection Inject 0.5 mLs into the muscle once.   triamcinolone (KENALOG) 0.025 % cream Apply 1 application topically 2 (two) times daily. Ok to mix with topical lotion and apply to all affected areas.   [DISCONTINUED] clotrimazole-betamethasone (LOTRISONE) cream Apply 1 application topically 2 (two) times daily.   [DISCONTINUED] THEO-24 100 MG 24 hr capsule TAKE 1 CAPSULE BY MOUTH EVERY DAY   clotrimazole-betamethasone (LOTRISONE) cream Apply 1 Application topically 2 (two) times daily.   THEO-24 100 MG 24 hr capsule Take 1 capsule (100 mg total) by mouth daily.   [DISCONTINUED] famotidine (PEPCID) 20 MG tablet Take 1 tablet (20 mg total) by mouth 2 (two) times daily.   [DISCONTINUED] pravastatin (PRAVACHOL) 40 MG tablet Take 1 tablet (40 mg total) by mouth daily.   No facility-administered encounter medications on file as of 10/19/2022.    Surgical History: Past Surgical History:  Procedure Laterality Date   [place]   CAROTID ANGIOGRAPHY Right 11/23/2019   Procedure: CAROTID ANGIOGRAPHY;  Surgeon: Katha Cabal, MD;  Location: [place] [facility];  Service: Cardiovascular;  Laterality: Right;   COLONOSCOPY     COLONOSCOPY WITH PROPOFOL N/A 01/25/2018   Procedure: COLONOSCOPY WITH PROPOFOL;  Surgeon: [name], MD;  Location: [facility] ENDOSCOPY;  Service: Endoscopy;  Laterality: N/A;   ESOPHAGOGASTRODUODENOSCOPY (EGD) WITH PROPOFOL N/A 01/24/2018   Procedure: ESOPHAGOGASTRODUODENOSCOPY (EGD) WITH PROPOFOL;  Surgeon: [name], MD;  Location: [facility] END[facility];  Service: Endoscopy;  Laterality:  N/A;   HIP ARTHROPLASTY Left 06/03/2019   Procedure: ARTHROPLASTY BIPOLAR HIP (HEMIARTHROPLASTY);  Surgeon: Dereck Leep, MD;  Location: [facility] OR[facility];  Service: Orthopedics;  Laterality: Left;   HIP ARTHROPLASTY Right 04/21/2020   Procedure: ARTHROPLASTY BIPOLAR HIP (HEMIARTHROPLASTY);  Surgeon: Corky Mull, MD;  Location: [facility] OR[facility];  Service: Orthopedics;  Laterality: Right;    Medical History: Past Medical History:  Diagnosis Date   Atrial fibrillation (HCC)    Bronchitis    Cancer (HCC)    Left leg growth, kidneys, lungs and breasts   Carcinoma of unknown primary ([place])    COPD (chronic obstructive pulmonary disease) ([place])    Diabetes mellitus without complication ([place])    Hyperlipidemia    Hypertension    Pancreatitis    Pneumonia    Stroke ([place])    TIA's   Vitamin B12 deficiency     Family History: Family History  Problem Relation Age of Onset   Cancer Father    Diabetes Brother     Social History   Socioeconomic History   Marital status: Divorced    Spouse name: Not on file   Number of children: Not on file   Years of education: Not on file   Highest education level: Not on file  Occupational History   Not on file  Tobacco Use   Smoking status: Former    Packs/day: 0.50    Years: 15.00    Total pack years: 7.50    Types: Cigarettes    Quit date: 11/15/1988    Years since quitting: 33.9   Smokeless tobacco: Never  Vaping Use   Vaping Use: Never used  Substance and Sexual Activity   Alcohol use: No    Alcohol/week: 0.0 standard drinks of alcohol   Drug use: No   Sexual activity: Not on file    Comment: hysterectomy   Other Topics Concern   Not on file  Social History Narrative   Not on file   Social Determinants of Health   Financial Resource Strain: Not on file  Food Insecurity: Not on file  Transportation Needs: Not on file  Physical Activity: Not on file  Stress: Not on file  Social Connections: Not on file  Intimate Partner Violence: Not on  file      Review of Systems  Constitutional:  Negative for chills, fatigue and unexpected weight change.  HENT:  Negative for congestion, rhinorrhea, sneezing and sore throat.   Eyes:  Negative for redness.  Respiratory:  Negative for cough, chest tightness and shortness of breath.   Cardiovascular:  Negative for chest pain and palpitations.  Gastrointestinal:  Negative for abdominal pain, constipation, diarrhea, nausea and vomiting.  Genitourinary:  Negative for dysuria and frequency.  Musculoskeletal:  Negative for arthralgias, back pain, joint swelling and neck pain.  Skin:  Positive for rash.  Neurological: Negative.  Negative for tremors and numbness.  Hematological:  Negative for adenopathy. Does not bruise/bleed easily.  Psychiatric/Behavioral:  Negative for behavioral problems (Depression), sleep disturbance and suicidal ideas. The patient is not nervous/anxious.     Vital Signs: BP (!) 124/46  Pulse 62   Temp (!) 96.8 F (36 C)   Resp 16   Ht '5\' 4"'$  (1.626 m)   Wt 142 lb 12.8 oz (64.8 kg)   SpO2 97%   BMI 24.51 kg/m    Physical Exam Vitals reviewed.  Constitutional:      General: She is not in acute distress.    Appearance: Normal appearance. She is normal weight. She is not ill-appearing.  HENT:     Head: Normocephalic and atraumatic.  Eyes:     Pupils: Pupils are equal, round, and reactive to light.  Cardiovascular:     Rate and Rhythm: Normal rate and regular rhythm.  Pulmonary:     Effort: Pulmonary effort is normal. No respiratory distress.  Neurological:     Mental Status: She is alert and oriented to person, place, and time.  Psychiatric:        Mood and Affect: Mood normal.        Behavior: Behavior normal.        Assessment/Plan: 1. Type 2 diabetes mellitus with vascular disease (HCC) A1c improving, continue current diet modifications as discussed, follow up in 3 months for repeat a1c - POCT glycosylated hemoglobin (Hb A1C)  2. Essential  hypertension Stable, DBP is slightly low but she is asymptomatic. Will continue to monitor periodically. Patient will monitor at home  3. Candidal dermatitis Reordered topical meds, continue as prescribed to affected areas as discussed  - clotrimazole-betamethasone (LOTRISONE) cream; Apply 1 Application topically 2 (two) times daily.  Dispense: 45 g; Refill: 1 - nystatin (MYCOSTATIN/NYSTOP) powder; Apply 1 Application topically 3 (three) times daily. To groin area until rash resolves.  Dispense: 15 g; Refill: 0  4. Medication refill - THEO-24 100 MG 24 hr capsule; Take 1 capsule (100 mg total) by mouth daily.  Dispense: 30 capsule; Refill: 5   General Counseling: Tashianna verbalizes understanding of the findings of todays visit and agrees with plan of treatment. I have discussed any further diagnostic evaluation that may be needed or ordered today. We also reviewed her medications today. she has been encouraged to call the office with any questions or concerns that should arise related to todays visit.    Orders Placed This Encounter  Procedures   POCT glycosylated hemoglobin (Hb A1C)    Meds ordered this encounter  Medications   clotrimazole-betamethasone (LOTRISONE) cream    Sig: Apply 1 Application topically 2 (two) times daily.    Dispense:  45 g    Refill:  1   nystatin (MYCOSTATIN/NYSTOP) powder    Sig: Apply 1 Application topically 3 (three) times daily. To groin area until rash resolves.    Dispense:  15 g    Refill:  0   THEO-24 100 MG 24 hr capsule    Sig: Take 1 capsule (100 mg total) by mouth daily.    Dispense:  30 capsule    Refill:  5    FOR NEXT FILL  [place] PATIENT    Return in about 3 months (around 01/26/2023) for F/U, Recheck A1C, Kenyanna Grzesiak PCP.   Total time spent:30 Minutes Time spent includes review of chart, medications, test results, and follow up plan with the patient.   [place] Controlled Substance Database was reviewed by me.  This patient was seen by  Jonetta Osgood, FNP-C in collaboration with Dr. Clayborn Bigness as a part of collaborative care agreement.   Mabell Esguerra R. Valetta Fuller, MSN, FNP-C Internal medicine

## 2022-10-21 ENCOUNTER — Encounter: Payer: Self-pay | Admitting: Nurse Practitioner

## 2022-11-15 ENCOUNTER — Other Ambulatory Visit: Payer: Self-pay | Admitting: Nurse Practitioner

## 2022-11-15 ENCOUNTER — Other Ambulatory Visit: Payer: Self-pay | Admitting: Internal Medicine

## 2022-11-15 DIAGNOSIS — I482 Chronic atrial fibrillation, unspecified: Secondary | ICD-10-CM

## 2022-11-15 DIAGNOSIS — E1159 Type 2 diabetes mellitus with other circulatory complications: Secondary | ICD-10-CM

## 2022-11-16 ENCOUNTER — Ambulatory Visit: Payer: BLUE CROSS/BLUE SHIELD | Admitting: Nurse Practitioner

## 2022-12-01 ENCOUNTER — Ambulatory Visit (INDEPENDENT_AMBULATORY_CARE_PROVIDER_SITE_OTHER): Payer: Medicare Other | Admitting: Nurse Practitioner

## 2022-12-01 ENCOUNTER — Encounter: Payer: Self-pay | Admitting: Nurse Practitioner

## 2022-12-01 VITALS — BP 129/73 | HR 84 | Temp 97.5°F | Resp 16 | Ht 64.0 in | Wt 139.0 lb

## 2022-12-01 DIAGNOSIS — L89312 Pressure ulcer of right buttock, stage 2: Secondary | ICD-10-CM | POA: Diagnosis not present

## 2022-12-01 DIAGNOSIS — I10 Essential (primary) hypertension: Secondary | ICD-10-CM

## 2022-12-01 DIAGNOSIS — J3089 Other allergic rhinitis: Secondary | ICD-10-CM

## 2022-12-01 DIAGNOSIS — R350 Frequency of micturition: Secondary | ICD-10-CM

## 2022-12-01 DIAGNOSIS — Z0001 Encounter for general adult medical examination with abnormal findings: Secondary | ICD-10-CM

## 2022-12-01 DIAGNOSIS — M25551 Pain in right hip: Secondary | ICD-10-CM

## 2022-12-01 DIAGNOSIS — Z760 Encounter for issue of repeat prescription: Secondary | ICD-10-CM

## 2022-12-01 MED ORDER — GABAPENTIN 300 MG PO CAPS: 600.0000 mg | ORAL_CAPSULE | Freq: Every day | ORAL | 1 refills | Status: DC

## 2022-12-01 MED ORDER — AMLODIPINE BESY-BENAZEPRIL HCL 10-20 MG PO CAPS: 1.0000 | ORAL_CAPSULE | Freq: Every day | ORAL | 2 refills | Status: DC

## 2022-12-01 MED ORDER — MUPIROCIN 2 % EX OINT: 1.0000 | TOPICAL_OINTMENT | Freq: Every day | CUTANEOUS | 2 refills | Status: DC

## 2022-12-01 MED ORDER — OXYBUTYNIN CHLORIDE ER 10 MG PO TB24: 10.0000 mg | ORAL_TABLET | Freq: Every day | ORAL | 3 refills | Status: DC

## 2022-12-01 MED ORDER — MONTELUKAST SODIUM 10 MG PO TABS: ORAL_TABLET | ORAL | 3 refills | Status: DC

## 2022-12-01 MED ORDER — THEO-24 100 MG PO CP24: 100.0000 mg | ORAL_CAPSULE | Freq: Every day | ORAL | 5 refills | Status: DC

## 2022-12-01 MED ORDER — MELOXICAM 7.5 MG PO TABS: 7.5000 mg | ORAL_TABLET | Freq: Every day | ORAL | 2 refills | Status: DC

## 2022-12-01 NOTE — Progress Notes (Signed)
[facility] [place], [place] [address]  Internal MEDICINE  Office Visit Note  Patient Name: Monica [name]  [id number]  [id number]  Date of Service: 12/01/2022  Chief Complaint  Patient presents with   Medicare Wellness   Hyperlipidemia   Hypertension   Diabetes    HPI Monica [name] presents for an annual well visit and physical exam.  Well-appearing 89 y.o. female with hypertension, atrial fibrillation, COPD, diabetes, arthritis, RLS, and back pain. checks her feet every day Labs: deferred for now New or worsening pain: buttocks Wound -- pressure ulcer that has worsened since the last time she was in rehab per patient report. Stage 2 ulcer Sleeping well at night but falling asleep some during the day, using a walker to get around.  Had a cold but is feeling better now.       12/01/2022    2:52 PM 11/24/2021    1:55 PM 10/28/2020    2:58 PM  MMSE - Mini Mental State Exam  Orientation to time '5 5 5  '$ Orientation to Place '5 5 5  '$ Registration '3 3 3  '$ Attention/ Calculation '5 5 5  '$ Recall '3 3 3  '$ Language- name 2 objects '2 2 2  '$ Language- repeat '1 1 1  '$ Language- follow 3 step command '3 3 3  '$ Language- read & follow direction '1 1 1  '$ Write a sentence 1 1 0  Copy design '1 1 1  '$ Total score '30 30 29    '$ Functional Status Survey: Is the patient deaf or have difficulty hearing?: Yes Does the patient have difficulty seeing, even when wearing glasses/contacts?: Yes Does the patient have difficulty concentrating, remembering, or making decisions?: No Does the patient have difficulty walking or climbing stairs?: Yes Does the patient have difficulty dressing or bathing?: No Does the patient have difficulty doing errands alone such as visiting a doctor's office or shopping?: No     11/24/2021    1:48 PM 02/05/2022    3:49 PM 04/20/2022    2:32 PM 07/20/2022    2:49 PM 12/01/2022    2:50 PM  [place] in the past year? 0  0 0 0  Was there an injury with Fall?      0  Fall Risk Category Calculator     0  (RETIRED) Patient Fall Risk Level Low fall risk High fall risk Low fall risk    Patient at Risk for Falls Due to No Fall Risks  No Fall Risks  No Fall Risks  Fall risk Follow up Falls evaluation completed  Falls evaluation completed  Falls evaluation completed       12/01/2022    2:50 PM  Depression screen PHQ 2/9  Decreased Interest 0  Down, Depressed, Hopeless 0  PHQ - 2 Score 0        No data to display            Current Medication: Outpatient Encounter Medications as of 12/01/2022  Medication Sig   albuterol (VENTOLIN HFA) 108 (90 Base) MCG/ACT inhaler INHALE 2 PUFFS INTO LUNGS EVERY 4 HOURS AS NEEDED FOR WHEEZING OR SHORTNESS OF BREATH   clotrimazole-betamethasone (LOTRISONE) cream Apply 1 Application topically 2 (two) times daily.   desloratadine (CLARINEX) 5 MG tablet TAKE 1 TABLET BY MOUTH DAILY   ELIQUIS 2.5 MG TABS tablet TAKE ONE TABLET BY MOUTH TWICE DAILY   feeding supplement (BOOST HIGH PROTEIN) LIQD Take 1 Container by mouth as needed.   Ferrous  Sulfate (IRON) 28 MG TABS Take 1 tablet (28 mg total) by mouth daily. Or any iron supplements over-the-counter.   glucose blood (ONETOUCH ULTRA) test strip 1 each by Other route daily. Use as instructed   metFORMIN (GLUCOPHAGE-XR) 500 MG 24 hr tablet TAKE ONE (1) TABLET BY MOUTH TWO TIMES PER DAY WITH A MEAL   mupirocin ointment (BACTROBAN) 2 % Apply 1 Application topically daily. To wound on buttocks until healed.   nystatin (MYCOSTATIN/NYSTOP) powder Apply 1 Application topically 3 (three) times daily. To groin area until rash resolves.   nystatin cream (MYCOSTATIN) Apply topically 2 (two) times daily.   OneTouch Delica Lancets 46K MISC Use as directed once daily as needed   senna-docusate (SENOKOT-S) 8.6-50 MG tablet Take 1 tablet by mouth at bedtime as needed for mild constipation.   Tdap (BOOSTRIX) 5-2.5-18.5 LF-MCG/0.5 injection Inject 0.5 mLs into the muscle once.    triamcinolone (KENALOG) 0.025 % cream Apply 1 application topically 2 (two) times daily. Ok to mix with topical lotion and apply to all affected areas.   [DISCONTINUED] amLODipine-benazepril (LOTREL) 10-20 MG capsule TAKE 1 CAPSULE BY MOUTH DAILY.   [DISCONTINUED] fluticasone furoate-vilanterol (BREO ELLIPTA) 100-25 MCG/INH AEPB INHALE ONE PUFF EVERY DAY   [DISCONTINUED] gabapentin (NEURONTIN) 300 MG capsule TAKE 2 CAPSULES BY MOUTH AT BEDTIME   [DISCONTINUED] hydrochlorothiazide (HYDRODIURIL) 25 MG tablet Take 1 tablet (25 mg total) by mouth daily.   [DISCONTINUED] meloxicam (MOBIC) 7.5 MG tablet Take 1 tablet (7.5 mg total) by mouth daily.   [DISCONTINUED] montelukast (SINGULAIR) 10 MG tablet TAKE ONE TABLET (10 MG) BY MOUTH AT BEDTIME   [DISCONTINUED] oxybutynin (DITROPAN-XL) 10 MG 24 hr tablet TAKE 1 TABLET BY MOUTH DAILY   [DISCONTINUED] THEO-24 100 MG 24 hr capsule Take 1 capsule (100 mg total) by mouth daily.   amLODipine-benazepril (LOTREL) 10-20 MG capsule Take 1 capsule by mouth daily.   gabapentin (NEURONTIN) 300 MG capsule Take 2 capsules (600 mg total) by mouth at bedtime.   meloxicam (MOBIC) 7.5 MG tablet Take 1 tablet (7.5 mg total) by mouth daily.   montelukast (SINGULAIR) 10 MG tablet TAKE ONE TABLET (10 MG) BY MOUTH AT BEDTIME   oxybutynin (DITROPAN-XL) 10 MG 24 hr tablet Take 1 tablet (10 mg total) by mouth daily.   THEO-24 100 MG 24 hr capsule Take 1 capsule (100 mg total) by mouth daily.   [DISCONTINUED] famotidine (PEPCID) 20 MG tablet Take 1 tablet (20 mg total) by mouth 2 (two) times daily.   [DISCONTINUED] pravastatin (PRAVACHOL) 40 MG tablet Take 1 tablet (40 mg total) by mouth daily.   No facility-administered encounter medications on file as of 12/01/2022.    Surgical History: Past Surgical History:  Procedure Laterality Date   [place]   CAROTID ANGIOGRAPHY Right 11/23/2019   Procedure: CAROTID ANGIOGRAPHY;  Surgeon:  Katha Cabal, MD;  Location: [facility];  Service: Cardiovascular;  Laterality: Right;   COLONOSCOPY     COLONOSCOPY WITH PROPOFOL N/A 01/25/2018   Procedure: COLONOSCOPY WITH PROPOFOL;  Surgeon: [name], MD;  Location: [facility] ENDOSCOPY;  Service: Endoscopy;  Laterality: N/A;   ESOPHAGOGASTRODUODENOSCOPY (EGD) WITH PROPOFOL N/A 01/24/2018   Procedure: ESOPHAGOGASTRODUODENOSCOPY (EGD) WITH PROPOFOL;  Surgeon: [name], MD;  Location: [facility] ENDOSCOPY;  Service: Endoscopy;  Laterality: N/A;   HIP ARTHROPLASTY Left 06/03/2019   Procedure: ARTHROPLASTY BIPOLAR HIP (HEMIARTHROPLASTY);  Surgeon: Dereck Leep, MD;  Location: [facility] ORS;  Service: Orthopedics;  Laterality:  Left;   HIP ARTHROPLASTY Right 04/21/2020   Procedure: ARTHROPLASTY BIPOLAR HIP (HEMIARTHROPLASTY);  Surgeon: Corky Mull, MD;  Location: [facility] ORS;  Service: Orthopedics;  Laterality: Right;    Medical History: Past Medical History:  Diagnosis Date   Atrial fibrillation (HCC)    Bronchitis    Cancer (HCC)    Left leg growth, kidneys, lungs and breasts   Carcinoma of unknown primary ([place])    COPD (chronic obstructive pulmonary disease) ([place])    Diabetes mellitus without complication ([place])    Hyperlipidemia    Hypertension    Pancreatitis    Pneumonia    Stroke ([place])    TIA's   Vitamin B12 deficiency     Family History: Family History  Problem Relation Age of Onset   Cancer Father    Diabetes Brother     Social History   Socioeconomic History   Marital status: Divorced    Spouse name: Not on file   Number of children: Not on file   Years of education: Not on file   Highest education level: Not on file  Occupational History   Not on file  Tobacco Use   Smoking status: Former    Packs/day: 0.50    Years: 15.00    Total pack years: 7.50    Types: Cigarettes    Quit date: 11/15/1988    Years since quitting: 34.0   Smokeless tobacco: Never  Vaping Use   Vaping Use: Never used  Substance  and Sexual Activity   Alcohol use: No    Alcohol/week: 0.0 standard drinks of alcohol   Drug use: No   Sexual activity: Not on file    Comment: hysterectomy   Other Topics Concern   Not on file  Social History Narrative   Not on file   Social Determinants of Health   Financial Resource Strain: Not on file  Food Insecurity: Not on file  Transportation Needs: Not on file  Physical Activity: Not on file  Stress: Not on file  Social Connections: Not on file  Intimate Partner Violence: Not on file      Review of Systems  Constitutional:  Negative for activity change, appetite change, chills, fatigue, fever and unexpected weight change.  HENT: Negative.  Negative for congestion, ear pain, rhinorrhea, sore throat and trouble swallowing.   Eyes: Negative.   Respiratory: Negative.  Negative for cough, chest tightness, shortness of breath and wheezing.   Cardiovascular: Negative.  Negative for chest pain.  Gastrointestinal: Negative.  Negative for abdominal pain, blood in stool, constipation, diarrhea, nausea and vomiting.  Endocrine: Negative.   Genitourinary:  Positive for frequency. Negative for difficulty urinating, dysuria, hematuria and urgency.  Musculoskeletal: Negative.  Negative for arthralgias, back pain, joint swelling, myalgias and neck pain.  Skin: Negative.  Negative for rash and wound.  Allergic/Immunologic: Negative.  Negative for immunocompromised state.  Neurological: Negative.  Negative for dizziness, seizures, numbness and headaches.  Hematological: Negative.   Psychiatric/Behavioral: Negative.  Negative for behavioral problems, self-injury and suicidal ideas. The patient is not nervous/anxious.     Vital Signs: BP 129/73   Pulse 84   Temp (!) 97.5 F (36.4 C)   Resp 16   Ht '5\' 4"'$  (1.626 m)   Wt 139 lb (63 kg)   SpO2 98%   BMI 23.86 kg/m    Physical Exam Vitals reviewed.  Constitutional:      General: She is awake. She is not in acute distress.  Appearance: Normal appearance. She is well-developed, well-groomed and overweight. She is not ill-appearing or diaphoretic.  HENT:     Head: Normocephalic and atraumatic.     Right Ear: Tympanic membrane, ear canal and external ear normal.     Left Ear: Tympanic membrane, ear canal and external ear normal.     Nose: Nose normal. No congestion or rhinorrhea.     Mouth/Throat:     Lips: Pink.     Mouth: Mucous membranes are moist.     Pharynx: Oropharynx is clear. Uvula midline. No oropharyngeal exudate or posterior oropharyngeal erythema.  Eyes:     General: Lids are normal. Vision grossly intact. Gaze aligned appropriately. No scleral icterus.       Right eye: No discharge.        Left eye: No discharge.     Extraocular Movements: Extraocular movements intact.     Conjunctiva/sclera: Conjunctivae normal.     Pupils: Pupils are equal, round, and reactive to light.  Neck:     Thyroid: No thyromegaly.     Vascular: No JVD.     Trachea: No tracheal deviation.     Comments: Voice has changed over the years.  Cardiovascular:     Rate and Rhythm: Normal rate and regular rhythm.     Pulses:          Dorsalis pedis pulses are 2+ on the right side and 2+ on the left side.       Posterior tibial pulses are 1+ on the right side and 1+ on the left side.     Heart sounds: Normal heart sounds, S1 normal and S2 normal. No murmur heard.    No friction rub. No gallop.  Pulmonary:     Effort: Pulmonary effort is normal. No accessory muscle usage or respiratory distress.     Breath sounds: Normal breath sounds and air entry. No stridor. No wheezing or rales.  Chest:     Chest wall: No tenderness.     Comments: Declined clinical breast exam, no longer gets regular mammograms.  Abdominal:     General: Bowel sounds are normal. There is no distension.     Palpations: Abdomen is soft. There is no shifting dullness, fluid wave, mass or pulsatile mass.     Tenderness: There is no abdominal tenderness.  There is no guarding or rebound.  Musculoskeletal:        General: No tenderness.     Cervical back: Normal range of motion and neck supple.     Right lower leg: 1+ Edema (wears compression stockings) present.     Left lower leg: 1+ Edema (wearings compression stockings) present.     Right foot: Normal range of motion. No deformity, bunion, Charcot foot, foot drop or prominent metatarsal heads.     Left foot: Normal range of motion. No deformity, bunion, Charcot foot, foot drop or prominent metatarsal heads.  Feet:     Right foot:     Skin integrity: Skin integrity normal.     Toenail Condition: Right toenails are long.     Left foot:     Skin integrity: Skin integrity normal.     Toenail Condition: Left toenails are long.  Lymphadenopathy:     Cervical: No cervical adenopathy.  Skin:    General: Skin is warm and dry.     Capillary Refill: Capillary refill takes less than 2 seconds.     Coloration: Skin is not pale.     Findings: Wound present.  No erythema or rash.          Comments: Stage 2 pressure ulcer on right buttocks   Neurological:     Mental Status: She is alert and oriented to person, place, and time.     Cranial Nerves: No cranial nerve deficit.     Motor: No abnormal muscle tone.     Coordination: Coordination normal.     Deep Tendon Reflexes: Reflexes are normal and symmetric.  Psychiatric:        Mood and Affect: Mood normal.        Behavior: Behavior normal. Behavior is cooperative.        Thought Content: Thought content normal.        Judgment: Judgment normal.        Assessment/Plan: 1. Encounter for routine adult health examination with abnormal findings Age-appropriate preventive screenings and vaccinations discussed, annual physical exam completed. Routine labs for health maintenance deferred. All medication refills sent to pharmacy. PHM updated.  - gabapentin (NEURONTIN) 300 MG capsule; Take 2 capsules (600 mg total) by mouth at bedtime.  Dispense:  180 capsule; Refill: 1 - meloxicam (MOBIC) 7.5 MG tablet; Take 1 tablet (7.5 mg total) by mouth daily.  Dispense: 30 tablet; Refill: 2 - montelukast (SINGULAIR) 10 MG tablet; TAKE ONE TABLET (10 MG) BY MOUTH AT BEDTIME  Dispense: 30 tablet; Refill: 3 - oxybutynin (DITROPAN-XL) 10 MG 24 hr tablet; Take 1 tablet (10 mg total) by mouth daily.  Dispense: 30 tablet; Refill: 3 - THEO-24 100 MG 24 hr capsule; Take 1 capsule (100 mg total) by mouth daily.  Dispense: 30 capsule; Refill: 5  2. Pressure injury of right buttock, stage 2 (HCC) Prescription topical antibiotic ointment prescribed. Discussed ways to keep pressure off the wound. Instructed patient to call the clinic if the wound gets any worse, otherwise continue to apply mupirocin daily until the wound is healed.  - mupirocin ointment (BACTROBAN) 2 %; Apply 1 Application topically daily. To wound on buttocks until healed.  Dispense: 30 g; Refill: 2  3. Essential hypertension Stable, continue medication as prescribed - amLODipine-benazepril (LOTREL) 10-20 MG capsule; Take 1 capsule by mouth daily.  Dispense: 30 capsule; Refill: 2      General Counseling: Renika verbalizes understanding of the findings of todays visit and agrees with plan of treatment. I have discussed any further diagnostic evaluation that may be needed or ordered today. We also reviewed her medications today. she has been encouraged to call the office with any questions or concerns that should arise related to todays visit.    No orders of the defined types were placed in this encounter.   Meds ordered this encounter  Medications   mupirocin ointment (BACTROBAN) 2 %    Sig: Apply 1 Application topically daily. To wound on buttocks until healed.    Dispense:  30 g    Refill:  2    Please send first tube asap.   amLODipine-benazepril (LOTREL) 10-20 MG capsule    Sig: Take 1 capsule by mouth daily.    Dispense:  30 capsule    Refill:  2    FOR NEXT FILL  [place]  PATIENT   gabapentin (NEURONTIN) 300 MG capsule    Sig: Take 2 capsules (600 mg total) by mouth at bedtime.    Dispense:  180 capsule    Refill:  1    FOR NEXT FILL [facility] PATIENT   meloxicam (MOBIC) 7.5 MG tablet    Sig: Take 1 tablet (  7.5 mg total) by mouth daily.    Dispense:  30 tablet    Refill:  2    Discussed drug interactions with patent today 06/09/21. Patient will stop taking naproxen and try meloxicam and continue to monitor herself for bleeding, call w/ questions [phone]   montelukast (SINGULAIR) 10 MG tablet    Sig: TAKE ONE TABLET (10 MG) BY MOUTH AT BEDTIME    Dispense:  30 tablet    Refill:  3    FOR NEXT FILL [facility] PATIENT   oxybutynin (DITROPAN-XL) 10 MG 24 hr tablet    Sig: Take 1 tablet (10 mg total) by mouth daily.    Dispense:  30 tablet    Refill:  3    FOR NEXT FILL [facility]SY[facility] PATIENT   THEO-24 100 MG 24 hr capsule    Sig: Take 1 capsule (100 mg total) by mouth daily.    Dispense:  30 capsule    Refill:  5    FOR NEXT FILL  [place] PATIENT    Return in about 2 months (around 01/30/2023) for F/U, Recheck A1C, Cyani Kallstrom PCP.   Total time spent:30 Minutes Time spent includes review of chart, medications, test results, and follow up plan with the patient.   [place] Controlled Substance Database was reviewed by me.  This patient was seen by Jonetta Osgood, FNP-C in collaboration with Dr. Clayborn Bigness as a part of collaborative care agreement.  Quillan Whitter R. Valetta Fuller, MSN, FNP-C Internal medicine

## 2022-12-05 ENCOUNTER — Encounter: Payer: Self-pay | Admitting: Nurse Practitioner

## 2022-12-09 ENCOUNTER — Encounter: Payer: Self-pay | Admitting: Emergency Medicine

## 2022-12-09 ENCOUNTER — Emergency Department: Payer: Medicare Other

## 2022-12-09 ENCOUNTER — Emergency Department
Admission: EM | Admit: 2022-12-09 | Discharge: 2022-12-09 | Disposition: A | Discharge status: Home / Self Care | Payer: Medicare Other | Attending: Emergency Medicine | Admitting: Emergency Medicine

## 2022-12-09 ENCOUNTER — Other Ambulatory Visit: Payer: Self-pay

## 2022-12-09 DIAGNOSIS — S8991XA Unspecified injury of right lower leg, initial encounter: Secondary | ICD-10-CM | POA: Diagnosis not present

## 2022-12-09 DIAGNOSIS — M6289 Other specified disorders of muscle: Secondary | ICD-10-CM | POA: Diagnosis not present

## 2022-12-09 DIAGNOSIS — N3289 Other specified disorders of bladder: Secondary | ICD-10-CM | POA: Diagnosis not present

## 2022-12-09 DIAGNOSIS — S79911A Unspecified injury of right hip, initial encounter: Secondary | ICD-10-CM | POA: Diagnosis not present

## 2022-12-09 DIAGNOSIS — W19XXXA Unspecified fall, initial encounter: Secondary | ICD-10-CM | POA: Diagnosis not present

## 2022-12-09 DIAGNOSIS — I4891 Unspecified atrial fibrillation: Secondary | ICD-10-CM | POA: Insufficient documentation

## 2022-12-09 DIAGNOSIS — M25561 Pain in right knee: Secondary | ICD-10-CM | POA: Diagnosis not present

## 2022-12-09 DIAGNOSIS — Z79899 Other long term (current) drug therapy: Secondary | ICD-10-CM | POA: Diagnosis not present

## 2022-12-09 DIAGNOSIS — Z043 Encounter for examination and observation following other accident: Secondary | ICD-10-CM | POA: Diagnosis not present

## 2022-12-09 DIAGNOSIS — I10 Essential (primary) hypertension: Secondary | ICD-10-CM | POA: Diagnosis not present

## 2022-12-09 DIAGNOSIS — M533 Sacrococcygeal disorders, not elsewhere classified: Secondary | ICD-10-CM | POA: Diagnosis not present

## 2022-12-09 DIAGNOSIS — E119 Type 2 diabetes mellitus without complications: Secondary | ICD-10-CM | POA: Insufficient documentation

## 2022-12-09 DIAGNOSIS — M25551 Pain in right hip: Secondary | ICD-10-CM | POA: Insufficient documentation

## 2022-12-09 DIAGNOSIS — Z90710 Acquired absence of both cervix and uterus: Secondary | ICD-10-CM | POA: Diagnosis not present

## 2022-12-09 LAB — SYNOVIAL CELL COUNT + DIFF, W/ CRYSTALS
Crystals, Fluid: NONE SEEN
Eosinophils-Synovial: 0 %
Lymphocytes-Synovial Fld: 40 %
Monocyte-Macrophage-Synovial Fluid: 18 %
Neutrophil, Synovial: 42 %
WBC, Synovial: 228 /mm3 — ABNORMAL HIGH (ref 0–200)

## 2022-12-09 LAB — CBC
HCT: 36 % (ref 36.0–46.0)
Hemoglobin: 11.2 g/dL — ABNORMAL LOW (ref 12.0–15.0)
MCH: 25.5 pg — ABNORMAL LOW (ref 26.0–34.0)
MCHC: 31.1 g/dL (ref 30.0–36.0)
MCV: 81.8 fL (ref 80.0–100.0)
Platelets: 295 10*3/uL (ref 150–400)
RBC: 4.4 MIL/uL (ref 3.87–5.11)
RDW: 14.8 % (ref 11.5–15.5)
WBC: 8.1 10*3/uL (ref 4.0–10.5)
nRBC: 0 % (ref 0.0–0.2)

## 2022-12-09 LAB — BASIC METABOLIC PANEL
Anion gap: 9 (ref 5–15)
BUN: 26 mg/dL — ABNORMAL HIGH (ref 8–23)
CO2: 25 mmol/L (ref 22–32)
Calcium: 9.5 mg/dL (ref 8.9–10.3)
Chloride: 98 mmol/L (ref 98–111)
Creatinine, Ser: 1.02 mg/dL — ABNORMAL HIGH (ref 0.44–1.00)
GFR, Estimated: 51 mL/min — ABNORMAL LOW (ref >60)
Glucose, Bld: 155 mg/dL — ABNORMAL HIGH (ref 70–99)
Potassium: 3.9 mmol/L (ref 3.5–5.1)
Sodium: 132 mmol/L — ABNORMAL LOW (ref 135–145)

## 2022-12-09 MED ORDER — IBUPROFEN 600 MG PO TABS
600.0000 mg | ORAL_TABLET | Freq: Once | ORAL | Status: AC
  Administered 2022-12-09: 600 mg via ORAL
  Filled 2022-12-09: qty 1

## 2022-12-09 MED ORDER — LIDOCAINE HCL (PF) 1 % IJ SOLN
5.0000 mL | Freq: Once | INTRAMUSCULAR | Status: AC
  Administered 2022-12-09: 5 mL via INTRADERMAL
  Filled 2022-12-09: qty 5

## 2022-12-09 NOTE — ED Notes (Signed)
Pt went home to daughter at this time.

## 2022-12-09 NOTE — ED Triage Notes (Signed)
Pt brought in by [facility] for fall at [facility], pt denies any head or neck pain. Pt is co right hip pain. Pt has hx of bilat hip replacement.

## 2022-12-09 NOTE — ED Provider Notes (Signed)
[facility] Provider Note    Event Date/Time   First MD Initiated Contact with Patient 12/09/22 1534     (approximate)   History   Fall   HPI  Monica [name] is a 89 y.o. female past medical history significant for atrial fibrillation on Eliquis, diabetes, hypertension, who presents to the emergency department following a fall.  Patient states that she woke up today and started having pain in her right knee.  Attempted to get up but had a fall.  Lives in an independent living facility.  Denies any head injury or loss of consciousness.  Significant pain to the right knee since that time.  Denies any fever or chills.     Physical Exam   Triage Vital Signs: ED Triage Vitals  Enc Vitals Group     BP 12/09/22 1335 133/64     Pulse Rate 12/09/22 1335 82     Resp 12/09/22 1335 20     Temp 12/09/22 1335 (!) 97.5 F (36.4 C)     Temp Source 12/09/22 1335 Oral     SpO2 12/09/22 1335 99 %     Weight 12/09/22 1334 129 lb (58.5 kg)     Height 12/09/22 1334 '5\' 4"'$  (1.626 m)     Head Circumference --      Peak Flow --      Pain Score 12/09/22 1334 4     Pain Loc --      Pain Edu? --      Excl. in [place]? --     Most recent vital signs: Vitals:   12/09/22 1335  BP: 133/64  Pulse: 82  Resp: 20  Temp: (!) 97.5 F (36.4 C)  SpO2: 99%    Physical Exam Constitutional:      Appearance: She is well-developed.  HENT:     Head: Atraumatic.  Eyes:     Conjunctiva/sclera: Conjunctivae normal.  Cardiovascular:     Rate and Rhythm: Regular rhythm.  Pulmonary:     Effort: No respiratory distress.  Abdominal:     General: There is no distension.  Musculoskeletal:        General: Swelling present.     Cervical back: Normal range of motion.     Comments: Mild tenderness to palpation to the right hip.  Joint effusion to the right knee with no overlying erythema or warmth.  Full flexion and extension with some pain.  No crepitance.  +2 DP pulses.  Skin:     General: Skin is warm.     Comments: Stage I pressure ulcer to the right buttocks and coccyx area  Neurological:     Mental Status: She is alert. Mental status is at baseline.     IMPRESSION / MDM / ASSESSMENT AND PLAN / ED COURSE  I reviewed the triage vital signs and the nursing notes.  Differential diagnosis including fracture, dislocation, joint effusion including gout/pseudogout, strain, meniscus injury, ligamentous injury   RADIOLOGY I independently reviewed imaging, my interpretation of imaging: X-ray of the hip with no acute fracture or dislocation.  Chest x-ray with no acute fracture or pneumonia.  CT scan of the pelvis with not an obvious fracture.  Read as questionable lucency possible occult pelvic fracture but has a joint replacement.  X-ray of the right knee with an effusion.  LABS (all labs ordered are listed, but only abnormal results are displayed) Labs interpreted as -   No signs of a septic joint or inflammatory arthritis.  Labs Reviewed  CBC - Abnormal; Notable for the following components:      Result Value   Hemoglobin 11.2 (*)    MCH 25.5 (*)    All other components within normal limits  BASIC METABOLIC PANEL - Abnormal; Notable for the following components:   Sodium 132 (*)    Glucose, Bld 155 (*)    BUN 26 (*)    Creatinine, Ser 1.02 (*)    GFR, Estimated 51 (*)    All other components within normal limits  SYNOVIAL CELL COUNT + DIFF, W/ CRYSTALS - Abnormal; Notable for the following components:   Color, Synovial PINK (*)    Appearance-Synovial CLOUDY (*)    WBC, Synovial 228 (*)    All other components within normal limits  BODY FLUID CULTURE W GRAM STAIN    TREATMENT   Motrin 600 mg.  Patient does not want an assisted living facility.  Is adamant that she be returned back to her independent living facility.  Daughter is with her and will help assist her with a wheelchair back into her facility.  Given return precautions and discussed ongoing  evaluation for assisted living as patient will allow as an outpatient.  Given return precautions for any worsening symptoms or new falls.   PROCEDURES:  Critical Care performed: No  .Joint Aspiration/Arthrocentesis  Date/Time: 12/09/2022 7:59 PM  Performed by: Nathaniel Man, MD Authorized by: Nathaniel Man, MD   Consent:    Consent obtained:  Verbal   Consent given by:  Patient   Risks, benefits, and alternatives were discussed: yes     Risks discussed:  Bleeding, infection, pain, nerve damage, incomplete drainage and poor cosmetic result   Alternatives discussed:  Delayed treatment Universal protocol:    Procedure explained and questions answered to patient or proxy's satisfaction: yes     Relevant documents present and verified: yes     Test results available: yes     Imaging studies available: yes     Required blood products, implants, devices, and special equipment available: yes     Site/side marked: yes     Patient identity confirmed:  Verbally with patient, arm band and hospital-assigned identification number Location:    Location:  Knee   Knee:  R knee Anesthesia:    Anesthesia method:  Local infiltration   Local anesthetic:  Lidocaine 1% w/o epi Procedure details:    Preparation: Patient was prepped and draped in usual sterile fashion     Needle gauge:  18 G   Ultrasound guidance: no     Approach:  Inferior   Aspirate characteristics:  Blood-tinged   Steroid injected: no   Post-procedure details:    Dressing:  Adhesive bandage   Procedure completion:  Tolerated well, no immediate complications   Patient's presentation is most consistent with acute presentation with potential threat to life or bodily function.   MEDICATIONS ORDERED IN ED: Medications  ibuprofen (ADVIL) tablet 600 mg (600 mg Oral Given 12/09/22 1723)  lidocaine (PF) (XYLOCAINE) 1 % injection 5 mL (5 mLs Intradermal Given by Other 12/09/22 1756)    FINAL CLINICAL IMPRESSION(S) / ED DIAGNOSES    Final diagnoses:  Fall, initial encounter  Acute pain of right knee     Rx / DC Orders   ED Discharge Orders     None        Note:  This document was prepared using Dragon voice recognition software and may include unintentional dictation errors.   Nathaniel Man, MD  12/09/22 2000  

## 2022-12-09 NOTE — ED Triage Notes (Signed)
First nurse note  Pt from [facility], independent living. Pt had a mechanical fall, hitting her right hip, pt has inward rotation. Pt denies no pain when not moving.  EMS vitals Hr 80 168/66 98% RA

## 2022-12-13 LAB — BODY FLUID CULTURE W GRAM STAIN
Culture: NO GROWTH
Gram Stain: NONE SEEN

## 2022-12-14 ENCOUNTER — Ambulatory Visit: Payer: BLUE CROSS/BLUE SHIELD | Admitting: Nurse Practitioner

## 2022-12-15 ENCOUNTER — Telehealth: Payer: Self-pay | Admitting: Nurse Practitioner

## 2022-12-15 NOTE — Telephone Encounter (Signed)
Received [employer] order form. Gave to Alyssa for signature

## 2022-12-20 ENCOUNTER — Telehealth: Payer: Self-pay | Admitting: Nurse Practitioner

## 2022-12-20 NOTE — Telephone Encounter (Signed)
Faxed back Rehab order to [place]; [phone]

## 2022-12-21 DIAGNOSIS — R2689 Other abnormalities of gait and mobility: Secondary | ICD-10-CM | POA: Diagnosis not present

## 2022-12-21 DIAGNOSIS — R2681 Unsteadiness on feet: Secondary | ICD-10-CM | POA: Diagnosis not present

## 2022-12-21 DIAGNOSIS — Z9181 History of falling: Secondary | ICD-10-CM | POA: Diagnosis not present

## 2022-12-22 DIAGNOSIS — R2689 Other abnormalities of gait and mobility: Secondary | ICD-10-CM | POA: Diagnosis not present

## 2022-12-22 DIAGNOSIS — R2681 Unsteadiness on feet: Secondary | ICD-10-CM | POA: Diagnosis not present

## 2022-12-22 DIAGNOSIS — Z9181 History of falling: Secondary | ICD-10-CM | POA: Diagnosis not present

## 2022-12-23 ENCOUNTER — Telehealth: Payer: Self-pay | Admitting: Nurse Practitioner

## 2022-12-23 DIAGNOSIS — Z9181 History of falling: Secondary | ICD-10-CM | POA: Diagnosis not present

## 2022-12-23 DIAGNOSIS — R2681 Unsteadiness on feet: Secondary | ICD-10-CM | POA: Diagnosis not present

## 2022-12-23 DIAGNOSIS — R2689 Other abnormalities of gait and mobility: Secondary | ICD-10-CM | POA: Diagnosis not present

## 2022-12-23 NOTE — Telephone Encounter (Signed)
Received [facility] Physical Therapy order. Gave to Alyssa for signature

## 2022-12-24 DIAGNOSIS — R2681 Unsteadiness on feet: Secondary | ICD-10-CM | POA: Diagnosis not present

## 2022-12-24 DIAGNOSIS — R2689 Other abnormalities of gait and mobility: Secondary | ICD-10-CM | POA: Diagnosis not present

## 2022-12-24 DIAGNOSIS — Z9181 History of falling: Secondary | ICD-10-CM | POA: Diagnosis not present

## 2022-12-27 ENCOUNTER — Telehealth: Payer: Self-pay

## 2022-12-27 NOTE — Telephone Encounter (Signed)
[facility] from [facility] called asking for Wound dressing and physical therapy. Verbal orders giving. [phone]

## 2022-12-28 DIAGNOSIS — Z9181 History of falling: Secondary | ICD-10-CM | POA: Diagnosis not present

## 2022-12-28 DIAGNOSIS — R2681 Unsteadiness on feet: Secondary | ICD-10-CM | POA: Diagnosis not present

## 2022-12-28 DIAGNOSIS — R2689 Other abnormalities of gait and mobility: Secondary | ICD-10-CM | POA: Diagnosis not present

## 2023-01-01 DIAGNOSIS — I4891 Unspecified atrial fibrillation: Secondary | ICD-10-CM | POA: Diagnosis not present

## 2023-01-01 DIAGNOSIS — Z9181 History of falling: Secondary | ICD-10-CM | POA: Diagnosis not present

## 2023-01-01 DIAGNOSIS — K589 Irritable bowel syndrome without diarrhea: Secondary | ICD-10-CM | POA: Diagnosis not present

## 2023-01-01 DIAGNOSIS — L89312 Pressure ulcer of right buttock, stage 2: Secondary | ICD-10-CM | POA: Diagnosis not present

## 2023-01-01 DIAGNOSIS — J449 Chronic obstructive pulmonary disease, unspecified: Secondary | ICD-10-CM | POA: Diagnosis not present

## 2023-01-01 DIAGNOSIS — Z791 Long term (current) use of non-steroidal anti-inflammatories (NSAID): Secondary | ICD-10-CM | POA: Diagnosis not present

## 2023-01-01 DIAGNOSIS — I10 Essential (primary) hypertension: Secondary | ICD-10-CM | POA: Diagnosis not present

## 2023-01-01 DIAGNOSIS — Z7984 Long term (current) use of oral hypoglycemic drugs: Secondary | ICD-10-CM | POA: Diagnosis not present

## 2023-01-01 DIAGNOSIS — E119 Type 2 diabetes mellitus without complications: Secondary | ICD-10-CM | POA: Diagnosis not present

## 2023-01-01 DIAGNOSIS — Z7901 Long term (current) use of anticoagulants: Secondary | ICD-10-CM | POA: Diagnosis not present

## 2023-01-03 ENCOUNTER — Telehealth: Payer: Self-pay

## 2023-01-03 ENCOUNTER — Telehealth: Payer: Self-pay | Admitting: Nurse Practitioner

## 2023-01-03 DIAGNOSIS — I4891 Unspecified atrial fibrillation: Secondary | ICD-10-CM | POA: Diagnosis not present

## 2023-01-03 DIAGNOSIS — K589 Irritable bowel syndrome without diarrhea: Secondary | ICD-10-CM | POA: Diagnosis not present

## 2023-01-03 DIAGNOSIS — I10 Essential (primary) hypertension: Secondary | ICD-10-CM | POA: Diagnosis not present

## 2023-01-03 DIAGNOSIS — J449 Chronic obstructive pulmonary disease, unspecified: Secondary | ICD-10-CM | POA: Diagnosis not present

## 2023-01-03 DIAGNOSIS — E119 Type 2 diabetes mellitus without complications: Secondary | ICD-10-CM | POA: Diagnosis not present

## 2023-01-03 DIAGNOSIS — L89312 Pressure ulcer of right buttock, stage 2: Secondary | ICD-10-CM | POA: Diagnosis not present

## 2023-01-03 NOTE — Telephone Encounter (Signed)
[facility] PT order signed. Faxed back; [phone]. To be scanned-nm

## 2023-01-03 NOTE — Telephone Encounter (Signed)
Gave verbal to [facility] to [facility] [id number] for nursing  once a week for 3 ,2 times month 1 and 2 prn

## 2023-01-05 DIAGNOSIS — I4891 Unspecified atrial fibrillation: Secondary | ICD-10-CM | POA: Diagnosis not present

## 2023-01-05 DIAGNOSIS — I10 Essential (primary) hypertension: Secondary | ICD-10-CM | POA: Diagnosis not present

## 2023-01-05 DIAGNOSIS — K589 Irritable bowel syndrome without diarrhea: Secondary | ICD-10-CM | POA: Diagnosis not present

## 2023-01-05 DIAGNOSIS — L89312 Pressure ulcer of right buttock, stage 2: Secondary | ICD-10-CM | POA: Diagnosis not present

## 2023-01-05 DIAGNOSIS — E119 Type 2 diabetes mellitus without complications: Secondary | ICD-10-CM | POA: Diagnosis not present

## 2023-01-05 DIAGNOSIS — J449 Chronic obstructive pulmonary disease, unspecified: Secondary | ICD-10-CM | POA: Diagnosis not present

## 2023-01-07 DIAGNOSIS — E119 Type 2 diabetes mellitus without complications: Secondary | ICD-10-CM | POA: Diagnosis not present

## 2023-01-07 DIAGNOSIS — K589 Irritable bowel syndrome without diarrhea: Secondary | ICD-10-CM | POA: Diagnosis not present

## 2023-01-07 DIAGNOSIS — J449 Chronic obstructive pulmonary disease, unspecified: Secondary | ICD-10-CM | POA: Diagnosis not present

## 2023-01-07 DIAGNOSIS — I4891 Unspecified atrial fibrillation: Secondary | ICD-10-CM | POA: Diagnosis not present

## 2023-01-07 DIAGNOSIS — L89312 Pressure ulcer of right buttock, stage 2: Secondary | ICD-10-CM | POA: Diagnosis not present

## 2023-01-07 DIAGNOSIS — I10 Essential (primary) hypertension: Secondary | ICD-10-CM | POA: Diagnosis not present

## 2023-01-10 DIAGNOSIS — I10 Essential (primary) hypertension: Secondary | ICD-10-CM | POA: Diagnosis not present

## 2023-01-10 DIAGNOSIS — J449 Chronic obstructive pulmonary disease, unspecified: Secondary | ICD-10-CM | POA: Diagnosis not present

## 2023-01-10 DIAGNOSIS — I4891 Unspecified atrial fibrillation: Secondary | ICD-10-CM | POA: Diagnosis not present

## 2023-01-10 DIAGNOSIS — L89312 Pressure ulcer of right buttock, stage 2: Secondary | ICD-10-CM | POA: Diagnosis not present

## 2023-01-10 DIAGNOSIS — K589 Irritable bowel syndrome without diarrhea: Secondary | ICD-10-CM | POA: Diagnosis not present

## 2023-01-10 DIAGNOSIS — E119 Type 2 diabetes mellitus without complications: Secondary | ICD-10-CM | POA: Diagnosis not present

## 2023-01-13 DIAGNOSIS — I10 Essential (primary) hypertension: Secondary | ICD-10-CM | POA: Diagnosis not present

## 2023-01-13 DIAGNOSIS — E119 Type 2 diabetes mellitus without complications: Secondary | ICD-10-CM | POA: Diagnosis not present

## 2023-01-13 DIAGNOSIS — L89312 Pressure ulcer of right buttock, stage 2: Secondary | ICD-10-CM | POA: Diagnosis not present

## 2023-01-13 DIAGNOSIS — J449 Chronic obstructive pulmonary disease, unspecified: Secondary | ICD-10-CM | POA: Diagnosis not present

## 2023-01-13 DIAGNOSIS — K589 Irritable bowel syndrome without diarrhea: Secondary | ICD-10-CM | POA: Diagnosis not present

## 2023-01-13 DIAGNOSIS — I4891 Unspecified atrial fibrillation: Secondary | ICD-10-CM | POA: Diagnosis not present

## 2023-01-14 DIAGNOSIS — I4891 Unspecified atrial fibrillation: Secondary | ICD-10-CM | POA: Diagnosis not present

## 2023-01-14 DIAGNOSIS — K589 Irritable bowel syndrome without diarrhea: Secondary | ICD-10-CM | POA: Diagnosis not present

## 2023-01-14 DIAGNOSIS — I10 Essential (primary) hypertension: Secondary | ICD-10-CM | POA: Diagnosis not present

## 2023-01-14 DIAGNOSIS — J449 Chronic obstructive pulmonary disease, unspecified: Secondary | ICD-10-CM | POA: Diagnosis not present

## 2023-01-14 DIAGNOSIS — E119 Type 2 diabetes mellitus without complications: Secondary | ICD-10-CM | POA: Diagnosis not present

## 2023-01-14 DIAGNOSIS — L89312 Pressure ulcer of right buttock, stage 2: Secondary | ICD-10-CM | POA: Diagnosis not present

## 2023-01-17 DIAGNOSIS — K589 Irritable bowel syndrome without diarrhea: Secondary | ICD-10-CM | POA: Diagnosis not present

## 2023-01-17 DIAGNOSIS — L89312 Pressure ulcer of right buttock, stage 2: Secondary | ICD-10-CM | POA: Diagnosis not present

## 2023-01-17 DIAGNOSIS — J449 Chronic obstructive pulmonary disease, unspecified: Secondary | ICD-10-CM | POA: Diagnosis not present

## 2023-01-17 DIAGNOSIS — E119 Type 2 diabetes mellitus without complications: Secondary | ICD-10-CM | POA: Diagnosis not present

## 2023-01-17 DIAGNOSIS — I10 Essential (primary) hypertension: Secondary | ICD-10-CM | POA: Diagnosis not present

## 2023-01-17 DIAGNOSIS — I4891 Unspecified atrial fibrillation: Secondary | ICD-10-CM | POA: Diagnosis not present

## 2023-01-19 DIAGNOSIS — I10 Essential (primary) hypertension: Secondary | ICD-10-CM | POA: Diagnosis not present

## 2023-01-19 DIAGNOSIS — E119 Type 2 diabetes mellitus without complications: Secondary | ICD-10-CM | POA: Diagnosis not present

## 2023-01-19 DIAGNOSIS — L89312 Pressure ulcer of right buttock, stage 2: Secondary | ICD-10-CM | POA: Diagnosis not present

## 2023-01-19 DIAGNOSIS — K589 Irritable bowel syndrome without diarrhea: Secondary | ICD-10-CM | POA: Diagnosis not present

## 2023-01-19 DIAGNOSIS — J449 Chronic obstructive pulmonary disease, unspecified: Secondary | ICD-10-CM | POA: Diagnosis not present

## 2023-01-19 DIAGNOSIS — I4891 Unspecified atrial fibrillation: Secondary | ICD-10-CM | POA: Diagnosis not present

## 2023-01-20 ENCOUNTER — Ambulatory Visit (INDEPENDENT_AMBULATORY_CARE_PROVIDER_SITE_OTHER): Payer: Medicare Other | Admitting: Nurse Practitioner

## 2023-01-20 ENCOUNTER — Encounter: Payer: Self-pay | Admitting: Nurse Practitioner

## 2023-01-20 VITALS — BP 146/55 | HR 82 | Temp 97.0°F | Resp 16 | Ht 64.0 in | Wt 141.2 lb

## 2023-01-20 DIAGNOSIS — J3089 Other allergic rhinitis: Secondary | ICD-10-CM | POA: Diagnosis not present

## 2023-01-20 DIAGNOSIS — Z0001 Encounter for general adult medical examination with abnormal findings: Secondary | ICD-10-CM

## 2023-01-20 DIAGNOSIS — L89312 Pressure ulcer of right buttock, stage 2: Secondary | ICD-10-CM | POA: Diagnosis not present

## 2023-01-20 DIAGNOSIS — J0110 Acute frontal sinusitis, unspecified: Secondary | ICD-10-CM | POA: Diagnosis not present

## 2023-01-20 DIAGNOSIS — E1159 Type 2 diabetes mellitus with other circulatory complications: Secondary | ICD-10-CM | POA: Diagnosis not present

## 2023-01-20 DIAGNOSIS — M064 Inflammatory polyarthropathy: Secondary | ICD-10-CM | POA: Diagnosis not present

## 2023-01-20 LAB — POCT GLYCOSYLATED HEMOGLOBIN (HGB A1C): Hemoglobin A1C: 7.5 % — AB (ref 4.0–5.6)

## 2023-01-20 MED ORDER — MELOXICAM 7.5 MG PO TABS: 7.5000 mg | ORAL_TABLET | Freq: Every day | ORAL | 2 refills | Status: DC

## 2023-01-20 MED ORDER — MUPIROCIN 2 % EX OINT: 1.0000 | TOPICAL_OINTMENT | Freq: Every day | CUTANEOUS | 2 refills | Status: DC

## 2023-01-20 MED ORDER — AZITHROMYCIN 250 MG PO TABS: ORAL_TABLET | ORAL | 0 refills | Status: AC

## 2023-01-20 MED ORDER — MONTELUKAST SODIUM 10 MG PO TABS: ORAL_TABLET | ORAL | 5 refills | Status: DC

## 2023-01-20 NOTE — Progress Notes (Signed)
[facility] [place], [place] [address]  Internal MEDICINE  Office Visit Note  Patient Name: Monica [name]  [id number]  [id number]  Date of Service: 01/20/2023  Chief Complaint  Patient presents with   Hyperlipidemia   Hypertension   Diabetes    HPI Monica [name] presents for a follow-up visit for diabetes, hypertension and allergies.  Diabetes -- A1c is 7.5, slightly elevated, has been eating more ice cream again. Discussed diet changes.  Hypertension --controlled with current medications Environmental allergies -- due for refills of montelukast, also having more severe symptoms that show a possible sinus infection developing, requesting antibiotics esp due to her age.  Takes meloxicam for arthritis of multiple joints, need refill.  Pressure ulcer on her buttocks is healing well, needs a refill of mupirocin ointment to continue helping this area to finish healing.     Current Medication: Outpatient Encounter Medications as of 01/20/2023  Medication Sig   albuterol (VENTOLIN HFA) 108 (90 Base) MCG/ACT inhaler INHALE 2 PUFFS INTO LUNGS EVERY 4 HOURS AS NEEDED FOR WHEEZING OR SHORTNESS OF BREATH   amLODipine-benazepril (LOTREL) 10-20 MG capsule Take 1 capsule by mouth daily.   [EXPIRED] azithromycin (ZITHROMAX) 250 MG tablet Take 2 tablets on day 1, then 1 tablet daily on days 2 through 5   clotrimazole-betamethasone (LOTRISONE) cream Apply 1 Application topically 2 (two) times daily.   desloratadine (CLARINEX) 5 MG tablet TAKE 1 TABLET BY MOUTH DAILY   ELIQUIS 2.5 MG TABS tablet TAKE ONE TABLET BY MOUTH TWICE DAILY   feeding supplement (BOOST HIGH PROTEIN) LIQD Take 1 Container by mouth as needed.   Ferrous Sulfate (IRON) 28 MG TABS Take 1 tablet (28 mg total) by mouth daily. Or any iron supplements over-the-counter.   gabapentin (NEURONTIN) 300 MG capsule Take 2 capsules (600 mg total) by mouth at bedtime.   glucose blood (ONETOUCH ULTRA) test strip 1 each by Other  route daily. Use as instructed   metFORMIN (GLUCOPHAGE-XR) 500 MG 24 hr tablet TAKE ONE (1) TABLET BY MOUTH TWO TIMES PER DAY WITH A MEAL   nystatin (MYCOSTATIN/NYSTOP) powder Apply 1 Application topically 3 (three) times daily. To groin area until rash resolves.   nystatin cream (MYCOSTATIN) Apply topically 2 (two) times daily.   OneTouch Delica Lancets [id number] MISC Use as directed once daily as needed   oxybutynin (DITROPAN-XL) 10 MG 24 hr tablet Take 1 tablet (10 mg total) by mouth daily.   senna-docusate (SENOKOT-S) 8.6-50 MG tablet Take 1 tablet by mouth at bedtime as needed for mild constipation.   Tdap (BOOSTRIX) 5-2.5-18.5 LF-MCG/0.5 injection Inject 0.5 mLs into the muscle once.   THEO-24 100 MG 24 hr capsule Take 1 capsule (100 mg total) by mouth daily.   triamcinolone (KENALOG) 0.025 % cream Apply 1 application topically 2 (two) times daily. Ok to mix with topical lotion and apply to all affected areas.   [DISCONTINUED] meloxicam (MOBIC) 7.5 MG tablet Take 1 tablet (7.5 mg total) by mouth daily.   [DISCONTINUED] montelukast (SINGULAIR) 10 MG tablet TAKE ONE TABLET (10 MG) BY MOUTH AT BEDTIME   [DISCONTINUED] mupirocin ointment (BACTROBAN) 2 % Apply 1 Application topically daily. To wound on buttocks until healed.   meloxicam (MOBIC) 7.5 MG tablet Take 1 tablet (7.5 mg total) by mouth daily.   montelukast (SINGULAIR) 10 MG tablet TAKE ONE TABLET (10 MG) BY MOUTH AT BEDTIME   mupirocin ointment (BACTROBAN) 2 % Apply 1 Application topically daily. To wound on buttocks until healed.   [  DISCONTINUED] famotidine (PEPCID) 20 MG tablet Take 1 tablet (20 mg total) by mouth 2 (two) times daily.   [DISCONTINUED] pravastatin (PRAVACHOL) 40 MG tablet Take 1 tablet (40 mg total) by mouth daily.   No facility-administered encounter medications on file as of 01/20/2023.    Surgical History: Past Surgical History:  Procedure Laterality Date   [place]    CAROTID ANGIOGRAPHY Right 11/23/2019   Procedure: CAROTID ANGIOGRAPHY;  Surgeon: Katha Cabal, MD;  Location: [facility];  Service: Cardiovascular;  Laterality: Right;   COLONOSCOPY     COLONOSCOPY WITH PROPOFOL N/A 01/25/2018   Procedure: COLONOSCOPY WITH PROPOFOL;  Surgeon: [name], MD;  Location: [facility] ENDOSCOPY;  Service: Endoscopy;  Laterality: N/A;   ESOPHAGOGASTRODUODENOSCOPY (EGD) WITH PROPOFOL N/A 01/24/2018   Procedure: ESOPHAGOGASTRODUODENOSCOPY (EGD) WITH PROPOFOL;  Surgeon: [name], MD;  Location: [facility] ENDOSCOPY;  Service: Endoscopy;  Laterality: N/A;   HIP ARTHROPLASTY Left 06/03/2019   Procedure: ARTHROPLASTY BIPOLAR HIP (HEMIARTHROPLASTY);  Surgeon: Dereck Leep, MD;  Location: [facility] ORS;  Service: Orthopedics;  Laterality: Left;   HIP ARTHROPLASTY Right 04/21/2020   Procedure: ARTHROPLASTY BIPOLAR HIP (HEMIARTHROPLASTY);  Surgeon: Corky Mull, MD;  Location: [facility] ORS;  Service: Orthopedics;  Laterality: Right;    Medical History: Past Medical History:  Diagnosis Date   Atrial fibrillation (HCC)    Bronchitis    Cancer (HCC)    Left leg growth, kidneys, lungs and breasts   Carcinoma of unknown primary ([place])    COPD (chronic obstructive pulmonary disease) ([place])    Diabetes mellitus without complication ([place])    Hyperlipidemia    Hypertension    Pancreatitis    Pneumonia    Stroke ([place])    TIA's   Vitamin B12 deficiency     Family History: Family History  Problem Relation Age of Onset   Cancer Father    Diabetes Brother     Social History   Socioeconomic History   Marital status: Divorced    Spouse name: Not on file   Number of children: Not on file   Years of education: Not on file   Highest education level: Not on file  Occupational History   Not on file  Tobacco Use   Smoking status: Former    Packs/day: 0.50    Years: 15.00    Additional pack years: 0.00    Total pack years: 7.50    Types: Cigarettes    Quit date:  11/15/1988    Years since quitting: 34.2   Smokeless tobacco: Never  Vaping Use   Vaping Use: Never used  Substance and Sexual Activity   Alcohol use: No    Alcohol/week: 0.0 standard drinks of alcohol   Drug use: No   Sexual activity: Not on file    Comment: hysterectomy   Other Topics Concern   Not on file  Social History Narrative   Not on file   Social Determinants of Health   Financial Resource Strain: Not on file  Food Insecurity: Not on file  Transportation Needs: Not on file  Physical Activity: Not on file  Stress: Not on file  Social Connections: Not on file  Intimate Partner Violence: Not on file      Review of Systems  Constitutional:  Positive for fatigue. Negative for chills and unexpected weight change.  HENT:  Positive for congestion, ear pain, postnasal drip, rhinorrhea, sinus pressure, sinus pain and sneezing. Negative for sore throat.  Eyes:  Negative for redness.  Respiratory:  Positive for cough. Negative for chest tightness and shortness of breath.   Cardiovascular: Negative.  Negative for chest pain and palpitations.  Gastrointestinal:  Negative for abdominal pain, constipation, diarrhea, nausea and vomiting.  Genitourinary:  Negative for dysuria and frequency.  Musculoskeletal:  Positive for arthralgias and back pain. Negative for joint swelling and neck pain.  Skin:  Positive for wound (wound on buttocks continues to heal, is smaller and closing up per patient.). Negative for rash.  Neurological:  Positive for headaches. Negative for tremors and numbness.  Hematological:  Negative for adenopathy. Does not bruise/bleed easily.  Psychiatric/Behavioral:  Negative for behavioral problems (Depression), sleep disturbance and suicidal ideas. The patient is not nervous/anxious.     Vital Signs: BP (!) 146/55   Pulse 82   Temp (!) 97 F (36.1 C)   Resp 16   Ht 5\' 4"  (1.626 m)   Wt 141 lb 3.2 oz (64 kg)   SpO2 95%   BMI 24.24 kg/m    Physical  Exam Vitals reviewed.  Constitutional:      General: She is not in acute distress.    Appearance: Normal appearance. She is normal weight. She is not ill-appearing.  HENT:     Head: Normocephalic and atraumatic.     Right Ear: Tympanic membrane, ear canal and external ear normal.     Left Ear: Tympanic membrane, ear canal and external ear normal.     Nose: Mucosal edema, congestion and rhinorrhea present. Rhinorrhea is purulent.     Right Turbinates: Swollen and pale.     Left Turbinates: Swollen and pale.     Right Sinus: Frontal sinus tenderness present. No maxillary sinus tenderness.     Left Sinus: Frontal sinus tenderness present. No maxillary sinus tenderness.     Mouth/Throat:     Mouth: Mucous membranes are moist.     Pharynx: Posterior oropharyngeal erythema present.  Eyes:     Pupils: Pupils are equal, round, and reactive to light.  Cardiovascular:     Rate and Rhythm: Normal rate and regular rhythm.  Pulmonary:     Effort: Pulmonary effort is normal. No respiratory distress.  Neurological:     Mental Status: She is alert and oriented to person, place, and time.  Psychiatric:        Mood and Affect: Mood normal.        Behavior: Behavior normal.        Assessment/Plan: 1. Acute non-recurrent frontal sinusitis Zpak prescribed to treat developing sinus infection - azithromycin (ZITHROMAX) 250 MG tablet; Take 2 tablets on day 1, then 1 tablet daily on days 2 through 5  Dispense: 6 tablet; Refill: 0  2. Type 2 diabetes mellitus with vascular disease (HCC) Slightly elevated A1c, continue metformin as prescribed. Decrease frequency of eating ice cream, will repeat A1c in 4 months.  - POCT glycosylated hemoglobin (Hb A1C)  3. Pressure injury of right buttock, stage 2 (HCC) Continue applying mupirocin ointment to wound until healed - mupirocin ointment (BACTROBAN) 2 %; Apply 1 Application topically daily. To wound on buttocks until healed.  Dispense: 30 g; Refill:  2  4. Inflammatory polyarthritis (HCC) Continue meloxicam as prescribed.  - meloxicam (MOBIC) 7.5 MG tablet; Take 1 tablet (7.5 mg total) by mouth daily.  Dispense: 30 tablet; Refill: 2  5. Non-seasonal allergic rhinitis due to other allergic trigger Refills of montelukast ordered, continue medication as prescribed.  - montelukast (SINGULAIR) 10 MG tablet;  TAKE ONE TABLET (10 MG) BY MOUTH AT BEDTIME  Dispense: 30 tablet; Refill: 5   General Counseling: Kasidee verbalizes understanding of the findings of todays visit and agrees with plan of treatment. I have discussed any further diagnostic evaluation that may be needed or ordered today. We also reviewed her medications today. she has been encouraged to call the office with any questions or concerns that should arise related to todays visit.    Orders Placed This Encounter  Procedures   POCT glycosylated hemoglobin (Hb A1C)    Meds ordered this encounter  Medications   montelukast (SINGULAIR) 10 MG tablet    Sig: TAKE ONE TABLET (10 MG) BY MOUTH AT BEDTIME    Dispense:  30 tablet    Refill:  5    FOR NEXT FILL MEDSYNC PATIENT   mupirocin ointment (BACTROBAN) 2 %    Sig: Apply 1 Application topically daily. To wound on buttocks until healed.    Dispense:  30 g    Refill:  2    Please send first tube asap.   meloxicam (MOBIC) 7.5 MG tablet    Sig: Take 1 tablet (7.5 mg total) by mouth daily.    Dispense:  30 tablet    Refill:  2    Discussed drug interactions with patent today 06/09/21. Patient will stop taking naproxen and try meloxicam and continue to monitor herself for bleeding, call w/ questions [phone]   azithromycin (ZITHROMAX) 250 MG tablet    Sig: Take 2 tablets on day 1, then 1 tablet daily on days 2 through 5    Dispense:  6 tablet    Refill:  0    Please deliver this medication as soon as possible    Return in about 4 months (around 05/22/2023) for F/U, Recheck A1C, Amaiyah Nordhoff PCP.   Total time spent:30  Minutes Time spent includes review of chart, medications, test results, and follow up plan with the patient.   [place] Controlled Substance Database was reviewed by me.  This patient was seen by Jonetta Osgood, FNP-C in collaboration with Dr. Clayborn Bigness as a part of collaborative care agreement.   Shavar Gorka R. Valetta Fuller, MSN, FNP-C Internal medicine

## 2023-01-24 DIAGNOSIS — J449 Chronic obstructive pulmonary disease, unspecified: Secondary | ICD-10-CM | POA: Diagnosis not present

## 2023-01-24 DIAGNOSIS — I4891 Unspecified atrial fibrillation: Secondary | ICD-10-CM | POA: Diagnosis not present

## 2023-01-24 DIAGNOSIS — K589 Irritable bowel syndrome without diarrhea: Secondary | ICD-10-CM | POA: Diagnosis not present

## 2023-01-24 DIAGNOSIS — E119 Type 2 diabetes mellitus without complications: Secondary | ICD-10-CM | POA: Diagnosis not present

## 2023-01-24 DIAGNOSIS — I10 Essential (primary) hypertension: Secondary | ICD-10-CM | POA: Diagnosis not present

## 2023-01-24 DIAGNOSIS — L89312 Pressure ulcer of right buttock, stage 2: Secondary | ICD-10-CM | POA: Diagnosis not present

## 2023-01-25 DIAGNOSIS — J209 Acute bronchitis, unspecified: Secondary | ICD-10-CM | POA: Diagnosis not present

## 2023-01-25 DIAGNOSIS — G2581 Restless legs syndrome: Secondary | ICD-10-CM | POA: Diagnosis not present

## 2023-01-25 DIAGNOSIS — I10 Essential (primary) hypertension: Secondary | ICD-10-CM | POA: Diagnosis not present

## 2023-01-25 DIAGNOSIS — Z86718 Personal history of other venous thrombosis and embolism: Secondary | ICD-10-CM | POA: Diagnosis not present

## 2023-01-25 DIAGNOSIS — R600 Localized edema: Secondary | ICD-10-CM | POA: Diagnosis not present

## 2023-01-25 DIAGNOSIS — C4922 Malignant neoplasm of connective and soft tissue of left lower limb, including hip: Secondary | ICD-10-CM | POA: Diagnosis not present

## 2023-01-25 DIAGNOSIS — E1165 Type 2 diabetes mellitus with hyperglycemia: Secondary | ICD-10-CM | POA: Diagnosis not present

## 2023-01-25 DIAGNOSIS — I6782 Cerebral ischemia: Secondary | ICD-10-CM | POA: Diagnosis not present

## 2023-01-25 DIAGNOSIS — I480 Paroxysmal atrial fibrillation: Secondary | ICD-10-CM | POA: Diagnosis not present

## 2023-01-25 DIAGNOSIS — J440 Chronic obstructive pulmonary disease with acute lower respiratory infection: Secondary | ICD-10-CM | POA: Diagnosis not present

## 2023-01-25 DIAGNOSIS — I824Y9 Acute embolism and thrombosis of unspecified deep veins of unspecified proximal lower extremity: Secondary | ICD-10-CM | POA: Diagnosis not present

## 2023-01-28 ENCOUNTER — Telehealth: Payer: Self-pay | Admitting: Nurse Practitioner

## 2023-01-28 NOTE — Telephone Encounter (Signed)
01/01/23 Plan of Care faxed back to [facility]; [phone]. Scanned-Toni

## 2023-01-28 NOTE — Telephone Encounter (Signed)
Received 01/01/23 [place] of Care. Gave to Alyssa for signature-Toni

## 2023-01-31 DIAGNOSIS — L89312 Pressure ulcer of right buttock, stage 2: Secondary | ICD-10-CM | POA: Diagnosis not present

## 2023-01-31 DIAGNOSIS — K589 Irritable bowel syndrome without diarrhea: Secondary | ICD-10-CM | POA: Diagnosis not present

## 2023-01-31 DIAGNOSIS — Z791 Long term (current) use of non-steroidal anti-inflammatories (NSAID): Secondary | ICD-10-CM | POA: Diagnosis not present

## 2023-01-31 DIAGNOSIS — J449 Chronic obstructive pulmonary disease, unspecified: Secondary | ICD-10-CM | POA: Diagnosis not present

## 2023-01-31 DIAGNOSIS — I10 Essential (primary) hypertension: Secondary | ICD-10-CM | POA: Diagnosis not present

## 2023-01-31 DIAGNOSIS — I4891 Unspecified atrial fibrillation: Secondary | ICD-10-CM | POA: Diagnosis not present

## 2023-01-31 DIAGNOSIS — Z7984 Long term (current) use of oral hypoglycemic drugs: Secondary | ICD-10-CM | POA: Diagnosis not present

## 2023-01-31 DIAGNOSIS — E119 Type 2 diabetes mellitus without complications: Secondary | ICD-10-CM | POA: Diagnosis not present

## 2023-01-31 DIAGNOSIS — Z9181 History of falling: Secondary | ICD-10-CM | POA: Diagnosis not present

## 2023-01-31 DIAGNOSIS — Z7901 Long term (current) use of anticoagulants: Secondary | ICD-10-CM | POA: Diagnosis not present

## 2023-02-01 ENCOUNTER — Telehealth: Payer: Self-pay | Admitting: Nurse Practitioner

## 2023-02-01 NOTE — Telephone Encounter (Signed)
Received order from [facility]. Gave to AA for signature-nm

## 2023-02-02 ENCOUNTER — Telehealth: Payer: Self-pay | Admitting: Nurse Practitioner

## 2023-02-02 NOTE — Telephone Encounter (Signed)
[facility] order signed. Faxed back; [phone]. To be scanned

## 2023-02-05 ENCOUNTER — Encounter: Payer: Self-pay | Admitting: Nurse Practitioner

## 2023-02-09 ENCOUNTER — Other Ambulatory Visit: Payer: Self-pay

## 2023-02-09 ENCOUNTER — Emergency Department: Payer: Medicare Other

## 2023-02-09 ENCOUNTER — Emergency Department
Admission: EM | Admit: 2023-02-09 | Discharge: 2023-02-09 | Disposition: A | Discharge status: Home / Self Care | Payer: Medicare Other | Attending: Student in an Organized Health Care Education/Training Program | Admitting: Student in an Organized Health Care Education/Training Program

## 2023-02-09 DIAGNOSIS — E119 Type 2 diabetes mellitus without complications: Secondary | ICD-10-CM | POA: Diagnosis not present

## 2023-02-09 DIAGNOSIS — T1490XA Injury, unspecified, initial encounter: Secondary | ICD-10-CM | POA: Diagnosis not present

## 2023-02-09 DIAGNOSIS — W19XXXA Unspecified fall, initial encounter: Secondary | ICD-10-CM | POA: Diagnosis not present

## 2023-02-09 DIAGNOSIS — I10 Essential (primary) hypertension: Secondary | ICD-10-CM | POA: Insufficient documentation

## 2023-02-09 DIAGNOSIS — I4891 Unspecified atrial fibrillation: Secondary | ICD-10-CM | POA: Diagnosis not present

## 2023-02-09 DIAGNOSIS — Z7901 Long term (current) use of anticoagulants: Secondary | ICD-10-CM | POA: Diagnosis not present

## 2023-02-09 DIAGNOSIS — S0101XA Laceration without foreign body of scalp, initial encounter: Secondary | ICD-10-CM | POA: Diagnosis not present

## 2023-02-09 DIAGNOSIS — Z7401 Bed confinement status: Secondary | ICD-10-CM | POA: Diagnosis not present

## 2023-02-09 DIAGNOSIS — R000 Tachycardia, unspecified: Secondary | ICD-10-CM | POA: Diagnosis not present

## 2023-02-09 DIAGNOSIS — R6889 Other general symptoms and signs: Secondary | ICD-10-CM | POA: Diagnosis not present

## 2023-02-09 DIAGNOSIS — W01198A Fall on same level from slipping, tripping and stumbling with subsequent striking against other object, initial encounter: Secondary | ICD-10-CM | POA: Diagnosis not present

## 2023-02-09 DIAGNOSIS — S0990XA Unspecified injury of head, initial encounter: Secondary | ICD-10-CM | POA: Diagnosis present

## 2023-02-09 DIAGNOSIS — Y92009 Unspecified place in unspecified non-institutional (private) residence as the place of occurrence of the external cause: Secondary | ICD-10-CM

## 2023-02-09 DIAGNOSIS — S0181XA Laceration without foreign body of other part of head, initial encounter: Secondary | ICD-10-CM

## 2023-02-09 DIAGNOSIS — Y92019 Unspecified place in single-family (private) house as the place of occurrence of the external cause: Secondary | ICD-10-CM | POA: Insufficient documentation

## 2023-02-09 LAB — CBC WITH DIFFERENTIAL/PLATELET
Abs Immature Granulocytes: 0.03 10*3/uL (ref 0.00–0.07)
Basophils Absolute: 0.1 10*3/uL (ref 0.0–0.1)
Basophils Relative: 1 %
Eosinophils Absolute: 0.2 10*3/uL (ref 0.0–0.5)
Eosinophils Relative: 3 %
HCT: 37.8 % (ref 36.0–46.0)
Hemoglobin: 11.8 g/dL — ABNORMAL LOW (ref 12.0–15.0)
Immature Granulocytes: 0 %
Lymphocytes Relative: 21 %
Lymphs Abs: 1.8 10*3/uL (ref 0.7–4.0)
MCH: 25.9 pg — ABNORMAL LOW (ref 26.0–34.0)
MCHC: 31.2 g/dL (ref 30.0–36.0)
MCV: 83.1 fL (ref 80.0–100.0)
Monocytes Absolute: 0.7 10*3/uL (ref 0.1–1.0)
Monocytes Relative: 9 %
Neutro Abs: 5.5 10*3/uL (ref 1.7–7.7)
Neutrophils Relative %: 66 %
Platelets: 328 10*3/uL (ref 150–400)
RBC: 4.55 MIL/uL (ref 3.87–5.11)
RDW: 14 % (ref 11.5–15.5)
WBC: 8.3 10*3/uL (ref 4.0–10.5)
nRBC: 0 % (ref 0.0–0.2)

## 2023-02-09 LAB — COMPREHENSIVE METABOLIC PANEL
ALT: 15 U/L (ref 0–44)
AST: 20 U/L (ref 15–41)
Albumin: 3.8 g/dL (ref 3.5–5.0)
Alkaline Phosphatase: 89 U/L (ref 38–126)
Anion gap: 9 (ref 5–15)
BUN: 19 mg/dL (ref 8–23)
CO2: 21 mmol/L — ABNORMAL LOW (ref 22–32)
Calcium: 9.9 mg/dL (ref 8.9–10.3)
Chloride: 105 mmol/L (ref 98–111)
Creatinine, Ser: 0.81 mg/dL (ref 0.44–1.00)
GFR, Estimated: >60 mL/min (ref >60)
Glucose, Bld: 145 mg/dL — ABNORMAL HIGH (ref 70–99)
Potassium: 4.4 mmol/L (ref 3.5–5.1)
Sodium: 135 mmol/L (ref 135–145)
Total Bilirubin: 0.8 mg/dL (ref 0.3–1.2)
Total Protein: 7.3 g/dL (ref 6.5–8.1)

## 2023-02-09 LAB — CBG MONITORING, ED: Glucose-Capillary: 140 mg/dL — ABNORMAL HIGH (ref 70–99)

## 2023-02-09 MED ORDER — LIDOCAINE-EPINEPHRINE-TETRACAINE (LET) TOPICAL GEL
3.0000 mL | Freq: Once | TOPICAL | Status: AC
  Administered 2023-02-09: 3 mL via TOPICAL
  Filled 2023-02-09: qty 3

## 2023-02-09 NOTE — ED Provider Notes (Signed)
[facility] Provider Note    Event Date/Time   First MD Initiated Contact with Patient 02/09/23 2048     (approximate)   History   Abdominal Pain and Fall (BIB medic due to fall at living facility.  Per medic, advised fall due to "weakness and not syncope."  Denies LOC from fall. Pt has a "R temporal laceration" from fall. Pt has a hx of HTN, diabetes and A.fib. Pt takes eliquis.  A&O x4.  Medic advises "HR: 80-100s, 95% RA, BP: +150/ +80.")   HPI  Monica [name] is a 89 y.o. female presents to the ER for evaluation of loss of balance that she was on the commode reaching for her glasses.  [place] [address] and hit the right side of her head.  Denies any other pain.  No LOC.  No numbness or tingling.  She is on Eliquis.  Does not feel weak.  No fevers or chills.  No other complaints.     Physical Exam   Triage Vital Signs: ED Triage Vitals  Enc Vitals Group     BP 02/09/23 2040 (!) 195/67     Pulse Rate 02/09/23 2040 65     Resp 02/09/23 2040 20     Temp 02/09/23 2042 98.6 F (37 C)     Temp Source 02/09/23 2042 Oral     SpO2 02/09/23 2040 99 %     Weight 02/09/23 2038 146 lb (66.2 kg)     Height 02/09/23 2038 5\' 4"  (1.626 m)     Head Circumference --      Peak Flow --      Pain Score 02/09/23 2038 0     Pain Loc --      Pain Edu? --      Excl. in [place]? --     Most recent vital signs: Vitals:   02/09/23 2130 02/09/23 2230  BP: (!) 156/99 (!) 143/70  Pulse: 79 92  Resp: 19 (!) 22  Temp:    SpO2: 100% 96%     Constitutional: Alert  Eyes: Conjunctivae are normal. eomi Head: Small contusion or bruise to the right lateral with small superficial hemostatic abrasion.. Nose: No congestion/rhinnorhea. Mouth/Throat: Mucous membranes are moist.   Neck: Painless ROM.  Cardiovascular:   Good peripheral circulation. Respiratory: Normal respiratory effort.  No retractions.  Gastrointestinal: Soft and nontender.  Musculoskeletal:  no deformity Neurologic:  MAE  spontaneously. No gross focal neurologic deficits are appreciated.  Skin:  Skin is warm, dry and intact. No rash noted. Psychiatric: Mood and affect are normal. Speech and behavior are normal.    ED Results / Procedures / Treatments   Labs (all labs ordered are listed, but only abnormal results are displayed) Labs Reviewed  CBC WITH DIFFERENTIAL/PLATELET - Abnormal; Notable for the following components:      Result Value   Hemoglobin 11.8 (*)    MCH 25.9 (*)    All other components within normal limits  COMPREHENSIVE METABOLIC PANEL - Abnormal; Notable for the following components:   CO2 21 (*)    Glucose, Bld 145 (*)    All other components within normal limits  CBG MONITORING, ED - Abnormal; Notable for the following components:   Glucose-Capillary 140 (*)    All other components within normal limits     EKG  ED ECG REPORT I, Merlyn Lot, the attending physician, personally viewed and interpreted this ECG.   Date: 02/09/2023  EKG Time: 20:46  Rate: 70  Rhythm: sinus  Axis: normal  Intervals: normal  ST&T Change: no stemi, no depressions    RADIOLOGY Please see ED Course for my review and interpretation.  I personally reviewed all radiographic images ordered to evaluate for the above acute complaints and reviewed radiology reports and findings.  These findings were personally discussed with the patient.  Please see medical record for radiology report.    PROCEDURES:  Critical Care performed: No  ..Laceration Repair  Date/Time: 02/09/2023 10:46 PM  Performed by: Merlyn Lot, MD Authorized by: Merlyn Lot, MD   Consent:    Consent obtained:  Verbal   Consent given by:  Patient Laceration details:    Location:  Scalp   Scalp location:  Frontal   Length (cm):  0.5   Depth (mm):  1 Pre-procedure details:    Preparation:  Patient was prepped and draped in usual sterile fashion Exploration:    Limited defect created (wound extended): no    Treatment:    Area cleansed with:  Povidone-iodine Skin repair:    Repair method:  Tissue adhesive Repair type:    Repair type:  Simple    MEDICATIONS ORDERED IN ED: Medications  lidocaine-EPINEPHrine-tetracaine (LET) topical gel (3 mLs Topical Given 02/09/23 2147)     IMPRESSION / MDM / [place] / ED COURSE  I reviewed the triage vital signs and the nursing notes.                              Differential diagnosis includes, but is not limited to, SDH, IPH, fracture, laceration, contusion, electrolyte abnormality  Patient presenting to the ER for evaluation of symptoms as described above.  Based on symptoms, risk factors and considered above differential, this presenting complaint could reflect a potentially life-threatening illness therefore the patient will be placed on continuous pulse oximetry and telemetry for monitoring.  Laboratory evaluation will be sent to evaluate for the above complaints.  most consistent with mechanical fall.  Not consistent with syncope.  CT imaging ordered for the above differential.  CT head on my review and potation does not show any evidence of SDH or IPH.  No acute findings per radiology.  Lack repair performed as above with [place].  Tetanus up-to-date.  Blood work reassuring.  Do not feel that further diagnostic testing clinically indicated.       FINAL CLINICAL IMPRESSION(S) / ED DIAGNOSES   Final diagnoses:  Fall in home, initial encounter     Rx / DC Orders   ED Discharge Orders     None        Note:  This document was prepared using Dragon voice recognition software and may include unintentional dictation errors.    Merlyn Lot, MD 02/09/23 2257

## 2023-02-09 NOTE — ED Notes (Signed)
called to [facility] for transport back to facility /[facility]/rep:karlie

## 2023-02-09 NOTE — ED Triage Notes (Signed)
Pt BIB medic from living facility.  Pt found sitting on floor in bathroom.  Per medic, pt denies LOC, but suffered a "R laceration to temporal region."  Medic advises pt fell "due to weakness, and not syncopal episode."  Pt has a hx of diabetes, A. Fib.  and HTN.  Advises pt A & O x4

## 2023-02-09 NOTE — ED Notes (Signed)
This RN attempted several times to call living facility to provide report.  No answer.

## 2023-02-10 NOTE — ED Notes (Signed)
RN made contact with pt daughter, [name], who advised there would be no one to receive rpt for pt at this time.

## 2023-02-14 ENCOUNTER — Telehealth: Payer: Self-pay | Admitting: Nurse Practitioner

## 2023-02-14 NOTE — Telephone Encounter (Signed)
Called patient to schedule ED follow up for a fall. She stated she is fine & visit is not necessary-Toni

## 2023-02-16 ENCOUNTER — Other Ambulatory Visit: Payer: Self-pay | Admitting: Nurse Practitioner

## 2023-02-21 ENCOUNTER — Telehealth: Payer: Self-pay | Admitting: Nurse Practitioner

## 2023-02-21 NOTE — Telephone Encounter (Signed)
Received [facility] order form 01/01/23 for AA signature. [facility] order form signed. Re-faxed back; [phone].

## 2023-02-25 ENCOUNTER — Emergency Department
Admission: EM | Admit: 2023-02-25 | Discharge: 2023-02-25 | Disposition: A | Discharge status: Home / Self Care | Payer: Medicare Other | Attending: Emergency Medicine | Admitting: Emergency Medicine

## 2023-02-25 ENCOUNTER — Telehealth: Payer: Self-pay

## 2023-02-25 ENCOUNTER — Emergency Department: Payer: Medicare Other

## 2023-02-25 DIAGNOSIS — R059 Cough, unspecified: Secondary | ICD-10-CM | POA: Diagnosis not present

## 2023-02-25 DIAGNOSIS — E119 Type 2 diabetes mellitus without complications: Secondary | ICD-10-CM | POA: Insufficient documentation

## 2023-02-25 DIAGNOSIS — J209 Acute bronchitis, unspecified: Secondary | ICD-10-CM | POA: Diagnosis not present

## 2023-02-25 DIAGNOSIS — I10 Essential (primary) hypertension: Secondary | ICD-10-CM | POA: Diagnosis not present

## 2023-02-25 DIAGNOSIS — Z20822 Contact with and (suspected) exposure to covid-19: Secondary | ICD-10-CM | POA: Insufficient documentation

## 2023-02-25 DIAGNOSIS — J440 Chronic obstructive pulmonary disease with acute lower respiratory infection: Secondary | ICD-10-CM | POA: Insufficient documentation

## 2023-02-25 DIAGNOSIS — R0602 Shortness of breath: Secondary | ICD-10-CM | POA: Diagnosis not present

## 2023-02-25 LAB — TROPONIN I (HIGH SENSITIVITY): Troponin I (High Sensitivity): 8 ng/L (ref <18)

## 2023-02-25 LAB — CBC WITH DIFFERENTIAL/PLATELET
Abs Immature Granulocytes: 0.03 10*3/uL (ref 0.00–0.07)
Basophils Absolute: 0.1 10*3/uL (ref 0.0–0.1)
Basophils Relative: 1 %
Eosinophils Absolute: 0.1 10*3/uL (ref 0.0–0.5)
Eosinophils Relative: 1 %
HCT: 36 % (ref 36.0–46.0)
Hemoglobin: 11.3 g/dL — ABNORMAL LOW (ref 12.0–15.0)
Immature Granulocytes: 1 %
Lymphocytes Relative: 27 %
Lymphs Abs: 1.7 10*3/uL (ref 0.7–4.0)
MCH: 26 pg (ref 26.0–34.0)
MCHC: 31.4 g/dL (ref 30.0–36.0)
MCV: 82.8 fL (ref 80.0–100.0)
Monocytes Absolute: 0.6 10*3/uL (ref 0.1–1.0)
Monocytes Relative: 10 %
Neutro Abs: 3.8 10*3/uL (ref 1.7–7.7)
Neutrophils Relative %: 60 %
Platelets: 249 10*3/uL (ref 150–400)
RBC: 4.35 MIL/uL (ref 3.87–5.11)
RDW: 14.2 % (ref 11.5–15.5)
WBC: 6.3 10*3/uL (ref 4.0–10.5)
nRBC: 0 % (ref 0.0–0.2)

## 2023-02-25 LAB — COMPREHENSIVE METABOLIC PANEL
ALT: 104 U/L — ABNORMAL HIGH (ref 0–44)
AST: 197 U/L — ABNORMAL HIGH (ref 15–41)
Albumin: 3.8 g/dL (ref 3.5–5.0)
Alkaline Phosphatase: 161 U/L — ABNORMAL HIGH (ref 38–126)
Anion gap: 10 (ref 5–15)
BUN: 12 mg/dL (ref 8–23)
CO2: 26 mmol/L (ref 22–32)
Calcium: 9.6 mg/dL (ref 8.9–10.3)
Chloride: 96 mmol/L — ABNORMAL LOW (ref 98–111)
Creatinine, Ser: 0.92 mg/dL (ref 0.44–1.00)
GFR, Estimated: 58 mL/min — ABNORMAL LOW (ref >60)
Glucose, Bld: 121 mg/dL — ABNORMAL HIGH (ref 70–99)
Potassium: 4.4 mmol/L (ref 3.5–5.1)
Sodium: 132 mmol/L — ABNORMAL LOW (ref 135–145)
Total Bilirubin: 1 mg/dL (ref 0.3–1.2)
Total Protein: 7 g/dL (ref 6.5–8.1)

## 2023-02-25 LAB — URINALYSIS, ROUTINE W REFLEX MICROSCOPIC
Bilirubin Urine: NEGATIVE
Glucose, UA: NEGATIVE mg/dL
Hgb urine dipstick: NEGATIVE
Ketones, ur: NEGATIVE mg/dL
Nitrite: NEGATIVE
Protein, ur: NEGATIVE mg/dL
Specific Gravity, Urine: 1.004 — ABNORMAL LOW (ref 1.005–1.030)
pH: 8 (ref 5.0–8.0)

## 2023-02-25 LAB — BRAIN NATRIURETIC PEPTIDE: B Natriuretic Peptide: 118.3 pg/mL — ABNORMAL HIGH (ref 0.0–100.0)

## 2023-02-25 LAB — RESP PANEL BY RT-PCR (RSV, FLU A&B, COVID)  RVPGX2
Influenza A by PCR: NEGATIVE
Influenza B by PCR: NEGATIVE
Resp Syncytial Virus by PCR: NEGATIVE
SARS Coronavirus 2 by RT PCR: NEGATIVE

## 2023-02-25 MED ORDER — DOXYCYCLINE HYCLATE 100 MG PO TABS: 100.0000 mg | ORAL_TABLET | Freq: Two times a day (BID) | ORAL | 0 refills | Status: AC

## 2023-02-25 MED ORDER — BENZONATATE 100 MG PO CAPS: 100.0000 mg | ORAL_CAPSULE | Freq: Three times a day (TID) | ORAL | 0 refills | Status: DC | PRN

## 2023-02-25 NOTE — ED Triage Notes (Signed)
Pt sts that for the last week she has been living off of cough drops due to a bad cough. Pt sts that her cough is not productive.

## 2023-02-25 NOTE — Telephone Encounter (Signed)
Pt daughter called that pt cannot getup on bed she having severe weakness,coughing and cannot  stand up by her self as per alyssa  advised that go to ED and also alyssa spoke with daughter and advised to ED

## 2023-02-25 NOTE — ED Notes (Signed)
Call placed to lab, troponin to be added to previous collection

## 2023-02-25 NOTE — ED Provider Notes (Signed)
[facility] Provider Note    Event Date/Time   First MD Initiated Contact with Patient 02/25/23 1229     (approximate)   History   Cough   HPI  Monica [name] is a 89 y.o. female with history of COPD, hyperlipidemia, A-fib, hypertension, stroke, pancreatitis, diabetes presents emergency department with cough for 1 week.  States feels little more short of breath than normal.  Some swelling in lower extremities.  No chest pain.  No fever.  No one else at home is sick.  States using cough drops without any relief.      Physical Exam   Triage Vital Signs: ED Triage Vitals  Enc Vitals Group     BP 02/25/23 1219 (!) 122/47     Pulse Rate 02/25/23 1219 70     Resp 02/25/23 1219 18     Temp 02/25/23 1219 98.4 F (36.9 C)     Temp Source 02/25/23 1219 Oral     SpO2 02/25/23 1219 95 %     Weight 02/25/23 1223 130 lb (59 kg)     Height --      Head Circumference --      Peak Flow --      Pain Score --      Pain Loc --      Pain Edu? --      Excl. in GC? --     Most recent vital signs: Vitals:   02/25/23 1330 02/25/23 1430  BP: (!) 167/86 (!) 168/80  Pulse: (!) 36 74  Resp:  16  Temp:    SpO2: 100% 97%     General: Awake, no distress.   CV:  Good peripheral perfusion. regular rate and  rhythm Resp:  Lungs some congestion noted in the left and right lower lobes, no increased respiratory effort Abd:  No distention.   Other:     ED Results / Procedures / Treatments   Labs (all labs ordered are listed, but only abnormal results are displayed) Labs Reviewed  COMPREHENSIVE METABOLIC PANEL - Abnormal; Notable for the following components:      Result Value   Sodium 132 (*)    Chloride 96 (*)    Glucose, Bld 121 (*)    AST 197 (*)    ALT 104 (*)    Alkaline Phosphatase 161 (*)    GFR, Estimated 58 (*)    All other components within normal limits  CBC WITH DIFFERENTIAL/PLATELET - Abnormal; Notable for the following components:    Hemoglobin 11.3 (*)    All other components within normal limits  URINALYSIS, ROUTINE W REFLEX MICROSCOPIC - Abnormal; Notable for the following components:   Color, Urine YELLOW (*)    APPearance CLEAR (*)    Specific Gravity, Urine 1.004 (*)    Leukocytes,Ua MODERATE (*)    Bacteria, UA RARE (*)    All other components within normal limits  RESP PANEL BY RT-PCR (RSV, FLU A&B, COVID)  RVPGX2  BRAIN NATRIURETIC PEPTIDE  TROPONIN I (HIGH SENSITIVITY)     EKG  EKG   RADIOLOGY Chest x-ray    PROCEDURES:   Procedures   MEDICATIONS ORDERED IN ED: Medications - No data to display   IMPRESSION / MDM / ASSESSMENT AND PLAN / ED COURSE  I reviewed the triage vital signs and the nursing notes.  Differential diagnosis includes, but is not limited to, CAP, COVID, influenza, RSV, CHF  Patient's presentation is most consistent with acute presentation with potential threat to life or bodily function.   I did order EKG, chest x-ray and lab work.  I have concerns that if she has CAP she will need to be admitted secondary to her age and history of COPD.   Labs are reassuring except for liver enzymes are elevated, do not feel this will change treatment today, she can follow-up with her regular doctor concerning liver enzymes.  Chest x-ray independently reviewed and interpreted by me as being negative for any acute abnormality.  Confirmed by radiology  EKG showed first-degree heart block, see physician read  I did explain the findings to the patient and the family.  I did consider admission but patient and the family would like to be discharged to home.  They can return to the emergency department if worsening.  She was given a prescription for doxycycline and Tessalon Perles for the cough.  Follow-up with her regular doctor if not improving in 3 days.  Return emergency department if worsening.  Patient and her daughter are in agreement treatment plan.  She  was discharged in stable condition.   FINAL CLINICAL IMPRESSION(S) / ED DIAGNOSES   Final diagnoses:  Acute bronchitis, unspecified organism     Rx / DC Orders   ED Discharge Orders          Ordered    doxycycline (VIBRA-TABS) 100 MG tablet  2 times daily        02/25/23 1439    benzonatate (TESSALON PERLES) 100 MG capsule  3 times daily PRN        02/25/23 1439             Note:  This document was prepared using Dragon voice recognition software and may include unintentional dictation errors.    Faythe Ghee, PA-C 02/25/23 1446    Pilar Jarvis, MD 02/25/23 [phone]

## 2023-02-25 NOTE — Discharge Instructions (Signed)
Care labs and imaging were reassuring today.  However you do have elevated liver enzymes, you can follow-up with your regular doctor concerning this as it would not change her treatment today. Take the medications as prescribed Return emergency department if worsening

## 2023-03-01 ENCOUNTER — Telehealth: Payer: Self-pay

## 2023-03-01 DIAGNOSIS — E119 Type 2 diabetes mellitus without complications: Secondary | ICD-10-CM | POA: Diagnosis not present

## 2023-03-01 DIAGNOSIS — I10 Essential (primary) hypertension: Secondary | ICD-10-CM | POA: Diagnosis not present

## 2023-03-01 DIAGNOSIS — J449 Chronic obstructive pulmonary disease, unspecified: Secondary | ICD-10-CM | POA: Diagnosis not present

## 2023-03-01 DIAGNOSIS — K589 Irritable bowel syndrome without diarrhea: Secondary | ICD-10-CM | POA: Diagnosis not present

## 2023-03-01 DIAGNOSIS — L89312 Pressure ulcer of right buttock, stage 2: Secondary | ICD-10-CM | POA: Diagnosis not present

## 2023-03-01 DIAGNOSIS — I4891 Unspecified atrial fibrillation: Secondary | ICD-10-CM | POA: Diagnosis not present

## 2023-03-01 NOTE — Telephone Encounter (Signed)
     Patient  visit on 02/25/2023  at [facility] was for cough.  Have you been able to follow up with your primary care physician? Yes  The patient was or was not able to obtain any needed medicine or equipment. Patient is able to obtain medication  Are there diet recommendations that you are having difficulty following? No  Patient expresses understanding of discharge instructions and education provided has no other needs at this time. Yes   Monica [name] [facility]  [facility] Population Health Community Resource Care Guide   [email or web address]  ?? [phone]   Website: [email or web address]  [email or web address]

## 2023-03-21 ENCOUNTER — Other Ambulatory Visit: Payer: Self-pay | Admitting: Nurse Practitioner

## 2023-03-21 DIAGNOSIS — J209 Acute bronchitis, unspecified: Secondary | ICD-10-CM

## 2023-03-29 ENCOUNTER — Other Ambulatory Visit: Payer: Self-pay

## 2023-03-29 ENCOUNTER — Emergency Department: Payer: Medicare Other

## 2023-03-29 ENCOUNTER — Emergency Department
Admission: EM | Admit: 2023-03-29 | Discharge: 2023-03-29 | Disposition: A | Discharge status: Home / Self Care | Payer: Medicare Other | Attending: Student in an Organized Health Care Education/Training Program | Admitting: Student in an Organized Health Care Education/Training Program

## 2023-03-29 DIAGNOSIS — I10 Essential (primary) hypertension: Secondary | ICD-10-CM | POA: Diagnosis not present

## 2023-03-29 DIAGNOSIS — S01111A Laceration without foreign body of right eyelid and periocular area, initial encounter: Secondary | ICD-10-CM | POA: Diagnosis not present

## 2023-03-29 DIAGNOSIS — S0181XA Laceration without foreign body of other part of head, initial encounter: Secondary | ICD-10-CM | POA: Diagnosis not present

## 2023-03-29 DIAGNOSIS — S0003XA Contusion of scalp, initial encounter: Secondary | ICD-10-CM | POA: Diagnosis not present

## 2023-03-29 DIAGNOSIS — W010XXA Fall on same level from slipping, tripping and stumbling without subsequent striking against object, initial encounter: Secondary | ICD-10-CM | POA: Insufficient documentation

## 2023-03-29 DIAGNOSIS — S0990XA Unspecified injury of head, initial encounter: Secondary | ICD-10-CM | POA: Diagnosis not present

## 2023-03-29 DIAGNOSIS — M25521 Pain in right elbow: Secondary | ICD-10-CM | POA: Insufficient documentation

## 2023-03-29 DIAGNOSIS — W19XXXA Unspecified fall, initial encounter: Secondary | ICD-10-CM | POA: Diagnosis not present

## 2023-03-29 DIAGNOSIS — Y92019 Unspecified place in single-family (private) house as the place of occurrence of the external cause: Secondary | ICD-10-CM | POA: Insufficient documentation

## 2023-03-29 DIAGNOSIS — Z7901 Long term (current) use of anticoagulants: Secondary | ICD-10-CM | POA: Diagnosis not present

## 2023-03-29 DIAGNOSIS — M4312 Spondylolisthesis, cervical region: Secondary | ICD-10-CM | POA: Diagnosis not present

## 2023-03-29 MED ORDER — LIDOCAINE-EPINEPHRINE-TETRACAINE (LET) SOLUTION
3.0000 mL | Freq: Once | NASAL | Status: DC
  Filled 2023-03-29: qty 3

## 2023-03-29 NOTE — ED Triage Notes (Signed)
Pt from [facility] and taking trash out when she fell face forward. EMS found a lac to right brow and a lac about an 1/4 inch to hairline. Bleeding controlled at time, on Eliquis, no LOC and pt remembers event. Pt has bruise to right elbow and causing pt pain. CBG 143 BP 193/95 106 HR 96% O2 RA

## 2023-03-29 NOTE — Discharge Instructions (Signed)
Your exam, CTs, and x-ray are normal and reassuring this time but no signs of a serious injury related to your fall.  Your facial lacerations were repaired using Dermabond glue.  Avoid putting any lotion, cream, ointment, or oral on the glued wounds.

## 2023-03-29 NOTE — ED Provider Notes (Signed)
[facility] Emergency Department Provider Note     Event Date/Time   First MD Initiated Contact with Patient 03/29/23 1857     (approximate)   History   Fall   HPI  Monica [name] is a 89 y.o. female presents to the ED via EMS from [facility] facility.  Patient was apparently taken out her trash, when she tripped and fell, falling face forward.  EMS was called out and found the patient alert with reports of right elbow pain as well as a laceration to the right brow and the forehead.  Patient is on Eliquis regularly for DVT/A-fib.  Vital signs reported as stable from EMS with a mildly hypertensive BP reading at 193/95 and slightly tachycardic at 106 bpm.     Physical Exam   Triage Vital Signs: ED Triage Vitals  Enc Vitals Group     BP 03/29/23 1854 (!) 164/88     Pulse Rate 03/29/23 1854 97     Resp 03/29/23 1854 18     Temp 03/29/23 1854 97.9 F (36.6 C)     Temp Source 03/29/23 1854 Oral     SpO2 03/29/23 1854 94 %     Weight 03/29/23 1857 135 lb (61.2 kg)     Height 03/29/23 1857 5\' 4"  (1.626 m)     Head Circumference --      Peak Flow --      Pain Score 03/29/23 1856 6     Pain Loc --      Pain Edu? --      Excl. in GC? --     Most recent vital signs: Vitals:   03/29/23 1854  BP: (!) 164/88  Pulse: 97  Resp: 18  Temp: 97.9 F (36.6 C)  SpO2: 94%    General Awake, no distress. NAD HEENT NCAT, except for a laceration to the right forehead MD right brow. PERRL. EOMI. No rhinorrhea. Mucous membranes are moist.  CV:  Good peripheral perfusion. RRR RESP:  Normal effort. CTA ABD:  No distention.  MSK:  Normal active range of motion of all extremities noted.  Right elbow with a superficial hematoma noted to the lateral epicondyle   ED Results / Procedures / Treatments   Labs (all labs ordered are listed, but only abnormal results are displayed) Labs Reviewed - No data to display   EKG   RADIOLOGY  I personally viewed and  evaluated these images as part of my medical decision making, as well as reviewing the written report by the radiologist.  ED Provider Interpretation: no acute findings  CT HEAD WO CONTRAST ( )  Result Date: 03/29/2023 CLINICAL DATA:  Fall, on Eliquis EXAM: CT HEAD WITHOUT CONTRAST CT MAXILLOFACIAL WITHOUT CONTRAST CT CERVICAL SPINE WITHOUT CONTRAST TECHNIQUE: Multidetector CT imaging of the head, cervical spine, and maxillofacial structures were performed using the standard protocol without intravenous contrast. Multiplanar CT image reconstructions of the cervical spine and maxillofacial structures were also generated. RADIATION DOSE REDUCTION: This exam was performed according to the departmental dose-optimization program which includes automated exposure control, adjustment of the mA and/or kV according to patient size and/or use of iterative reconstruction technique. COMPARISON:  02/09/2023 CT head and cervical spine no prior CT maxillofacial. FINDINGS: CT HEAD FINDINGS Brain: No evidence of acute infarct, hemorrhage, mass, mass effect, or midline shift. No hydrocephalus or extra-axial fluid collection. Cerebral volume is within normal limits for age. Vascular: No hyperdense vessel. Skull: Negative for fracture or focal lesion. CT  MAXILLOFACIAL FINDINGS Osseous: No fracture or mandibular dislocation. Periapical lucency about the roots of the right maxillary lateral incisor, first premolar, and second molar and the right mandibular central incisor, lateral incisor, and canine, concerning for apical periodontitis. Orbits: No traumatic or inflammatory finding. Status post bilateral lens replacements. Sinuses: Left maxillary mucous retention cyst. Soft tissues: Right frontal scalp/forehead hematoma. Linear metallic objects in the posterior scalp soft tissues. CT CERVICAL SPINE FINDINGS Alignment: No traumatic listhesis. Straightening and mild reversal of the normal cervical lordosis, with redemonstrated  trace anterolisthesis of C2 on C3. Skull base and vertebrae: No acute fracture. No primary bone lesion or focal pathologic process. Osseous fusion across the C5-C6 disc space. 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 Soft tissues and spinal canal: No prevertebral fluid or swelling. No visible canal hematoma. Disc levels: Degenerative changes in the cervical spine, with mild-to-moderate spinal canal stenosis at C4-C5, C5-C6, and C6-C7. Upper chest: Negative. Other: None. IMPRESSION: 1. No acute intracranial process. 2. No acute facial bone fracture. Right frontal scalp/forehead hematoma. 3. No acute fracture or traumatic listhesis in the cervical spine. Electronically Signed   By: Wiliam Ke M.D.   On: 03/29/2023 19:36   CT Maxillofacial Wo Contrast  Result Date: 03/29/2023 CLINICAL DATA:  Fall, on Eliquis EXAM: CT HEAD WITHOUT CONTRAST CT MAXILLOFACIAL WITHOUT CONTRAST CT CERVICAL SPINE WITHOUT CONTRAST TECHNIQUE: Multidetector CT imaging of the head, cervical spine, and maxillofacial structures were performed using the standard protocol without intravenous contrast. Multiplanar CT image reconstructions of the cervical spine and maxillofacial structures were also generated. RADIATION DOSE REDUCTION: This exam was performed according to the departmental dose-optimization program which includes automated exposure control, adjustment of the mA and/or kV according to patient size and/or use of iterative reconstruction technique. COMPARISON:  02/09/2023 CT head and cervical spine no prior CT maxillofacial. FINDINGS: CT HEAD FINDINGS Brain: No evidence of acute infarct, hemorrhage, mass, mass effect, or midline shift. No hydrocephalus or extra-axial fluid collection. Cerebral volume is within normal limits for age. Vascular: No hyperdense vessel. Skull: Negative for fracture or focal lesion. CT MAXILLOFACIAL FINDINGS Osseous: No fracture or mandibular dislocation. Periapical lucency about the  roots of the right maxillary lateral incisor, first premolar, and second molar and the right mandibular central incisor, lateral incisor, and canine, concerning for apical periodontitis. Orbits: No traumatic or inflammatory finding. Status post bilateral lens replacements. Sinuses: Left maxillary mucous retention cyst. Soft tissues: Right frontal scalp/forehead hematoma. Linear metallic objects in the posterior scalp soft tissues. CT CERVICAL SPINE FINDINGS Alignment: No traumatic listhesis. Straightening and mild reversal of the normal cervical lordosis, with redemonstrated trace anterolisthesis of C2 on C3. Skull base and vertebrae: No acute fracture. No primary bone lesion or focal pathologic process. Osseous fusion across the C5-C6 disc space. 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 Soft tissues and spinal canal: No prevertebral fluid or swelling. No visible canal hematoma. Disc levels: Degenerative changes in the cervical spine, with mild-to-moderate spinal canal stenosis at C4-C5, C5-C6, and C6-C7. Upper chest: Negative. Other: None. IMPRESSION: 1. No acute intracranial process. 2. No acute facial bone fracture. Right frontal scalp/forehead hematoma. 3. No acute fracture or traumatic listhesis in the cervical spine. Electronically Signed   By: Wiliam Ke M.D.   On: 03/29/2023 19:36   CT Cervical Spine Wo Contrast  Result Date: 03/29/2023 CLINICAL DATA:  Fall, on Eliquis EXAM: CT HEAD WITHOUT CONTRAST CT MAXILLOFACIAL WITHOUT CONTRAST CT CERVICAL SPINE WITHOUT CONTRAST TECHNIQUE: Multidetector CT imaging of the head, cervical spine,  and maxillofacial structures were performed using the standard protocol without intravenous contrast. Multiplanar CT image reconstructions of the cervical spine and maxillofacial structures were also generated. RADIATION DOSE REDUCTION: This exam was performed according to the departmental dose-optimization program which includes automated exposure control,  adjustment of the mA and/or kV according to patient size and/or use of iterative reconstruction technique. COMPARISON:  02/09/2023 CT head and cervical spine no prior CT maxillofacial. FINDINGS: CT HEAD FINDINGS Brain: No evidence of acute infarct, hemorrhage, mass, mass effect, or midline shift. No hydrocephalus or extra-axial fluid collection. Cerebral volume is within normal limits for age. Vascular: No hyperdense vessel. Skull: Negative for fracture or focal lesion. CT MAXILLOFACIAL FINDINGS Osseous: No fracture or mandibular dislocation. Periapical lucency about the roots of the right maxillary lateral incisor, first premolar, and second molar and the right mandibular central incisor, lateral incisor, and canine, concerning for apical periodontitis. Orbits: No traumatic or inflammatory finding. Status post bilateral lens replacements. Sinuses: Left maxillary mucous retention cyst. Soft tissues: Right frontal scalp/forehead hematoma. Linear metallic objects in the posterior scalp soft tissues. CT CERVICAL SPINE FINDINGS Alignment: No traumatic listhesis. Straightening and mild reversal of the normal cervical lordosis, with redemonstrated trace anterolisthesis of C2 on C3. Skull base and vertebrae: No acute fracture. No primary bone lesion or focal pathologic process. Osseous fusion across the C5-C6 disc space. 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 Soft tissues and spinal canal: No prevertebral fluid or swelling. No visible canal hematoma. Disc levels: Degenerative changes in the cervical spine, with mild-to-moderate spinal canal stenosis at C4-C5, C5-C6, and C6-C7. Upper chest: Negative. Other: None. IMPRESSION: 1. No acute intracranial process. 2. No acute facial bone fracture. Right frontal scalp/forehead hematoma. 3. No acute fracture or traumatic listhesis in the cervical spine. Electronically Signed   By: Wiliam Ke M.D.   On: 03/29/2023 19:36   DG Elbow Complete Right  Result  Date: 03/29/2023 CLINICAL DATA:  Pain after fall EXAM: RIGHT ELBOW - COMPLETE 3+ VIEW COMPARISON:  None Available. FINDINGS: There is no evidence of fracture, dislocation, or joint effusion. There is no evidence of arthropathy or other focal bone abnormality. Soft tissues are unremarkable. IMPRESSION: Negative. Electronically Signed   By: Darliss Cheney M.D.   On: 03/29/2023 19:20     PROCEDURES:  Critical Care performed: No  ..Laceration Repair  Date/Time: 03/29/2023 8:29 PM  Performed by: Lissa Hoard, PA-C Authorized by: Lissa Hoard, PA-C   Consent:    Consent obtained:  Verbal   Consent given by:  Patient   Risks, benefits, and alternatives were discussed: yes     Risks discussed:  Pain and poor wound healing Universal protocol:    Imaging studies available: yes     Site/side marked: yes     Patient identity confirmed:  Verbally with patient Anesthesia:    Anesthesia method:  Topical application   Topical anesthetic:  LET Laceration details:    Location:  Face   Face location:  Forehead (& R eyebrow)   Length (cm):  2.5 (& 1.5)   Depth (mm):  5 Pre-procedure details:    Preparation:  Patient was prepped and draped in usual sterile fashion Exploration:    Limited defect created (wound extended): no     Hemostasis achieved with:  LET   Contaminated: no   Treatment:    Area cleansed with:  Soap and water   Amount of cleaning:  Standard   Irrigation solution:  Tap water  Debridement:  None   Undermining:  None   Scar revision: no   Skin repair:    Repair method:  Tissue adhesive Approximation:    Approximation:  Close Repair type:    Repair type:  Simple Post-procedure details:    Dressing:  Open (no dressing)   Procedure completion:  Tolerated well, no immediate complications    MEDICATIONS ORDERED IN ED: Medications  lidocaine-EPINEPHrine-tetracaine (LET) solution (has no administration in time range)     IMPRESSION / MDM /  ASSESSMENT AND PLAN / ED COURSE  I reviewed the triage vital signs and the nursing notes.                              Differential diagnosis includes, but is not limited to, closed head injury, SDH, skull fracture, facial fracture, cervical spine fracture, elbow fracture abrasions, lacerations  Patient's presentation is most consistent with acute complicated illness / injury requiring diagnostic workup.  Patient's diagnosis is consistent with mechanical fall resulting in facial contusion and lacerations.  Minor head injury without evidence of LOC.  Patient presents in no acute distress with a reassuring exam overall.  CT imaging as well as plain films reviewed by me do not reveal any acute intracranial process, facial fracture, or cervical spine injury.  No evidence of any acute elbow fracture dislocation based on my interpretation.  Superficial facial wounds repaired with Dermabond.  Patient will be discharged home with wound care instructions. Patient is to follow up with primary care provider as needed or otherwise directed. Patient is given ED precautions to return to the ED for any worsening or new symptoms.  FINAL CLINICAL IMPRESSION(S) / ED DIAGNOSES   Final diagnoses:  Fall in home, initial encounter  Facial laceration, initial encounter  Minor head injury, initial encounter     Rx / DC Orders   ED Discharge Orders     None        Note:  This document was prepared using Dragon voice recognition software and may include unintentional dictation errors.    Lissa Hoard, PA-C 03/29/23 2031    Willy Eddy, MD 03/29/23 2035

## 2023-04-05 ENCOUNTER — Ambulatory Visit (INDEPENDENT_AMBULATORY_CARE_PROVIDER_SITE_OTHER): Payer: Medicare Other | Admitting: Nurse Practitioner

## 2023-04-05 ENCOUNTER — Encounter: Payer: Self-pay | Admitting: Nurse Practitioner

## 2023-04-05 VITALS — BP 140/60 | HR 65 | Temp 98.8°F | Resp 16 | Ht 64.0 in | Wt 135.8 lb

## 2023-04-05 DIAGNOSIS — I482 Chronic atrial fibrillation, unspecified: Secondary | ICD-10-CM

## 2023-04-05 DIAGNOSIS — Z9181 History of falling: Secondary | ICD-10-CM

## 2023-04-05 DIAGNOSIS — R2681 Unsteadiness on feet: Secondary | ICD-10-CM | POA: Diagnosis not present

## 2023-04-05 DIAGNOSIS — I10 Essential (primary) hypertension: Secondary | ICD-10-CM

## 2023-04-05 DIAGNOSIS — M064 Inflammatory polyarthropathy: Secondary | ICD-10-CM | POA: Diagnosis not present

## 2023-04-05 DIAGNOSIS — R531 Weakness: Secondary | ICD-10-CM | POA: Diagnosis not present

## 2023-04-05 MED ORDER — APIXABAN 2.5 MG PO TABS: 2.5000 mg | ORAL_TABLET | Freq: Two times a day (BID) | ORAL | 3 refills | Status: DC

## 2023-04-05 MED ORDER — AMLODIPINE BESY-BENAZEPRIL HCL 10-20 MG PO CAPS
1.0000 | ORAL_CAPSULE | Freq: Every day | ORAL | 2 refills | Status: DC
Start: 2023-04-05 — End: 2023-08-22

## 2023-04-05 NOTE — Progress Notes (Signed)
[facility] [address] [place], [place] [address]  Internal MEDICINE  Office Visit Note  Patient Name: Monica [name]  [id number]  [id number]  Date of Service: 04/05/2023  Chief Complaint  Patient presents with   Diabetes   Hypertension   Hyperlipidemia   Follow-up    ED f/u-fall     HPI Monica [name] presents for a follow-up visit for recent fall and generalized weakness Feeling weak, recent fall, need home physical therapy .  Polypharmacy -- wants to decrease medications she takes if possible.  AFIB -- takes eliquis Hypertension -- controlled with amlodipine-benazepril    Current Medication: Outpatient Encounter Medications as of 04/05/2023  Medication Sig   albuterol (VENTOLIN HFA) 108 (90 Base) MCG/ACT inhaler INHALE TWO PUFFS INTO THE LUNGS EVERY 4 HOURS AS NEEDED FOR WHEEZING OR SHORTNESS OF BREATH   clotrimazole-betamethasone (LOTRISONE) cream Apply 1 Application topically 2 (two) times daily.   feeding supplement (BOOST HIGH PROTEIN) LIQD Take 1 Container by mouth as needed.   gabapentin (NEURONTIN) 300 MG capsule Take 2 capsules (600 mg total) by mouth at bedtime.   glucose blood (ONETOUCH ULTRA) test strip 1 each by Other route daily. Use as instructed   meloxicam (MOBIC) 7.5 MG tablet Take 1 tablet (7.5 mg total) by mouth daily.   metFORMIN (GLUCOPHAGE-XR) 500 MG 24 hr tablet TAKE ONE (1) TABLET BY MOUTH TWO TIMES PER DAY WITH A MEAL   montelukast (SINGULAIR) 10 MG tablet TAKE ONE TABLET (10 MG) BY MOUTH AT BEDTIME   mupirocin ointment (BACTROBAN) 2 % Apply 1 Application topically daily. To wound on buttocks until healed.   nystatin (MYCOSTATIN/NYSTOP) powder Apply 1 Application topically 3 (three) times daily. To groin area until rash resolves.   OneTouch Delica Lancets 30G MISC Use as directed once daily as needed   oxybutynin (DITROPAN-XL) 10 MG 24 hr tablet Take 1 tablet (10 mg total) by mouth daily.   THEO-24 100 MG 24 hr capsule Take 1 capsule (100 mg  total) by mouth daily.   [DISCONTINUED] amLODipine-benazepril (LOTREL) 10-20 MG capsule Take 1 capsule by mouth daily.   [DISCONTINUED] benzonatate (TESSALON PERLES) 100 MG capsule Take 1 capsule (100 mg total) by mouth 3 (three) times daily as needed for cough.   [DISCONTINUED] desloratadine (CLARINEX) 5 MG tablet TAKE 1 TABLET BY MOUTH DAILY   [DISCONTINUED] ELIQUIS 2.5 MG TABS tablet TAKE ONE TABLET BY MOUTH TWICE DAILY   [DISCONTINUED] Ferrous Sulfate (IRON) 28 MG TABS Take 1 tablet (28 mg total) by mouth daily. Or any iron supplements over-the-counter.   [DISCONTINUED] nystatin cream (MYCOSTATIN) Apply topically 2 (two) times daily.   [DISCONTINUED] senna-docusate (SENOKOT-S) 8.6-50 MG tablet Take 1 tablet by mouth at bedtime as needed for mild constipation.   [DISCONTINUED] Tdap (BOOSTRIX) 5-2.5-18.5 LF-MCG/0.5 injection Inject 0.5 mLs into the muscle once.   [DISCONTINUED] triamcinolone (KENALOG) 0.025 % cream Apply 1 application topically 2 (two) times daily. Ok to mix with topical lotion and apply to all affected areas.   amLODipine-benazepril (LOTREL) 10-20 MG capsule Take 1 capsule by mouth daily.   apixaban (ELIQUIS) 2.5 MG TABS tablet Take 1 tablet (2.5 mg total) by mouth 2 (two) times daily.   [DISCONTINUED] famotidine (PEPCID) 20 MG tablet Take 1 tablet (20 mg total) by mouth 2 (two) times daily.   [DISCONTINUED] pravastatin (PRAVACHOL) 40 MG tablet Take 1 tablet (40 mg total) by mouth daily.   No facility-administered encounter medications on file as of 04/05/2023.    Surgical History: Past  Surgical History:  Procedure Laterality Date   ABDOMINAL HYSTERECTOMY  1975   BLADDER SUSPENSION  1989   CAROTID ANGIOGRAPHY Right 11/23/2019   Procedure: CAROTID ANGIOGRAPHY;  Surgeon: Renford Dills, MD;  Location: [facility] INVASIVE CV LAB;  Service: Cardiovascular;  Laterality: Right;   COLONOSCOPY     COLONOSCOPY WITH PROPOFOL N/A 01/25/2018   Procedure: COLONOSCOPY WITH PROPOFOL;   Surgeon: Midge Minium, MD;  Location: [facility] ENDOSCOPY;  Service: Endoscopy;  Laterality: N/A;   ESOPHAGOGASTRODUODENOSCOPY (EGD) WITH PROPOFOL N/A 01/24/2018   Procedure: ESOPHAGOGASTRODUODENOSCOPY (EGD) WITH PROPOFOL;  Surgeon: Midge Minium, MD;  Location: [facility] ENDOSCOPY;  Service: Endoscopy;  Laterality: N/A;   HIP ARTHROPLASTY Left 06/03/2019   Procedure: ARTHROPLASTY BIPOLAR HIP (HEMIARTHROPLASTY);  Surgeon: Donato Heinz, MD;  Location: [facility] ORS;  Service: Orthopedics;  Laterality: Left;   HIP ARTHROPLASTY Right 04/21/2020   Procedure: ARTHROPLASTY BIPOLAR HIP (HEMIARTHROPLASTY);  Surgeon: Christena Flake, MD;  Location: [facility] ORS;  Service: Orthopedics;  Laterality: Right;    Medical History: Past Medical History:  Diagnosis Date   Atrial fibrillation (HCC)    Bronchitis    Cancer (HCC)    Left leg growth, kidneys, lungs and breasts   Carcinoma of unknown primary (HCC)    COPD (chronic obstructive pulmonary disease) (HCC)    Diabetes mellitus without complication (HCC)    Hyperlipidemia    Hypertension    Pancreatitis    Pneumonia    Stroke (HCC)    TIA's   Vitamin B12 deficiency     Family History: Family History  Problem Relation Age of Onset   Cancer Father    Diabetes Brother     Social History   Socioeconomic History   Marital status: Divorced    Spouse name: Not on file   Number of children: Not on file   Years of education: Not on file   Highest education level: Not on file  Occupational History   Not on file  Tobacco Use   Smoking status: Former    Packs/day: 0.50    Years: 15.00    Additional pack years: 0.00    Total pack years: 7.50    Types: Cigarettes    Quit date: 11/15/1988    Years since quitting: 34.4   Smokeless tobacco: Never  Vaping Use   Vaping Use: Never used  Substance and Sexual Activity   Alcohol use: No    Alcohol/week: 0.0 standard drinks of alcohol   Drug use: No   Sexual activity: Not on file    Comment: hysterectomy   Other  Topics Concern   Not on file  Social History Narrative   Not on file   Social Determinants of Health   Financial Resource Strain: Not on file  Food Insecurity: Not on file  Transportation Needs: Not on file  Physical Activity: Not on file  Stress: Not on file  Social Connections: Not on file  Intimate Partner Violence: Not on file      Review of Systems  Constitutional:  Negative for chills, fatigue and unexpected weight change.  HENT: Negative.  Negative for congestion, rhinorrhea, sneezing and sore throat.   Eyes:  Negative for redness.  Respiratory: Negative.  Negative for cough, chest tightness, shortness of breath and wheezing.   Cardiovascular: Negative.  Negative for chest pain and palpitations.  Gastrointestinal: Negative.  Negative for abdominal pain, constipation, diarrhea, nausea and vomiting.  Genitourinary:  Negative for dysuria and frequency.  Musculoskeletal:  Positive for gait problem. Negative for arthralgias,  back pain, joint swelling and neck pain.  Skin: Negative.  Negative for rash.  Neurological:  Positive for weakness. Negative for tremors and numbness.  Hematological:  Negative for adenopathy. Does not bruise/bleed easily.  Psychiatric/Behavioral:  Negative for behavioral problems (Depression), self-injury, sleep disturbance and suicidal ideas. The patient is not nervous/anxious.     Vital Signs: BP (!) 140/60 Comment: 184/62  Pulse 65   Temp 98.8 F (37.1 C)   Resp 16   Ht 5\' 4"  (1.626 m)   Wt 135 lb 12.8 oz (61.6 kg)   SpO2 98%   BMI 23.31 kg/m    Physical Exam Vitals reviewed.  Constitutional:      General: She is not in acute distress.    Appearance: Normal appearance. She is normal weight. She is not ill-appearing.  HENT:     Head: Normocephalic and atraumatic.  Eyes:     Pupils: Pupils are equal, round, and reactive to light.  Cardiovascular:     Rate and Rhythm: Normal rate and regular rhythm.  Pulmonary:     Effort: Pulmonary  effort is normal. No respiratory distress.  Neurological:     Mental Status: She is alert and oriented to person, place, and time.  Psychiatric:        Mood and Affect: Mood normal.        Behavior: Behavior normal.        Assessment/Plan: 1. Essential hypertension Continue amlodipine-benazepril as prescribed. - amLODipine-benazepril (LOTREL) 10-20 MG capsule; Take 1 capsule by mouth daily.  Dispense: 30 capsule; Refill: 2  2. Atrial fibrillation, chronic (HCC) Continue eliquis as prescribed - apixaban (ELIQUIS) 2.5 MG TABS tablet; Take 1 tablet (2.5 mg total) by mouth 2 (two) times daily.  Dispense: 60 tablet; Refill: 3  3. Inflammatory polyarthritis (HCC) Referred to home health for physical therapy - Ambulatory referral to Home Health  4. Generalized weakness Referred to home health for physical therapy - Ambulatory referral to Home Health  5. Unsteady gait Referred to home health for physical therapy - Ambulatory referral to Home Health  6. History of recent fall Referred to home health for physical therapy - Ambulatory referral to Home Health   General Counseling: Monica [name] understanding of the findings of todays visit and agrees with plan of treatment. I have discussed any further diagnostic evaluation that may be needed or ordered today. We also reviewed her medications today. she has been encouraged to call the office with any questions or concerns that should arise related to todays visit.    Orders Placed This Encounter  Procedures   Ambulatory referral to Home Health    Meds ordered this encounter  Medications   apixaban (ELIQUIS) 2.5 MG TABS tablet    Sig: Take 1 tablet (2.5 mg total) by mouth 2 (two) times daily.    Dispense:  60 tablet    Refill:  3    FOR NEXT FILL MEDSYNC PATIENT   amLODipine-benazepril (LOTREL) 10-20 MG capsule    Sig: Take 1 capsule by mouth daily.    Dispense:  30 capsule    Refill:  2    FOR NEXT FILL  MEDSYNC  PATIENT    Return for previously scheduled, F/U, Recheck A1C, Judy Goodenow PCP in july.   Total time spent:30 Minutes Time spent includes review of chart, medications, test results, and follow up plan with the patient.   [place] Controlled Substance Database was reviewed by me.  This patient was seen by Sallyanne Kuster, FNP-C in collaboration  with Dr. Beverely Risen as a part of collaborative care agreement.   Iyanla Eilers R. Tedd Sias, MSN, FNP-C Internal medicine

## 2023-04-12 ENCOUNTER — Telehealth: Payer: Self-pay

## 2023-04-12 NOTE — Telephone Encounter (Addendum)
Send message to [facility] home health and they will accept this referral

## 2023-04-15 ENCOUNTER — Telehealth: Payer: Self-pay

## 2023-04-15 ENCOUNTER — Encounter: Payer: Self-pay | Admitting: Nurse Practitioner

## 2023-04-15 DIAGNOSIS — I10 Essential (primary) hypertension: Secondary | ICD-10-CM | POA: Diagnosis not present

## 2023-04-15 DIAGNOSIS — E538 Deficiency of other specified B group vitamins: Secondary | ICD-10-CM | POA: Diagnosis not present

## 2023-04-15 DIAGNOSIS — C7981 Secondary malignant neoplasm of breast: Secondary | ICD-10-CM | POA: Diagnosis not present

## 2023-04-15 DIAGNOSIS — M064 Inflammatory polyarthropathy: Secondary | ICD-10-CM | POA: Diagnosis not present

## 2023-04-15 DIAGNOSIS — Z8701 Personal history of pneumonia (recurrent): Secondary | ICD-10-CM | POA: Diagnosis not present

## 2023-04-15 DIAGNOSIS — C7801 Secondary malignant neoplasm of right lung: Secondary | ICD-10-CM | POA: Diagnosis not present

## 2023-04-15 DIAGNOSIS — C7802 Secondary malignant neoplasm of left lung: Secondary | ICD-10-CM | POA: Diagnosis not present

## 2023-04-15 DIAGNOSIS — G2581 Restless legs syndrome: Secondary | ICD-10-CM | POA: Diagnosis not present

## 2023-04-15 DIAGNOSIS — I4891 Unspecified atrial fibrillation: Secondary | ICD-10-CM | POA: Diagnosis not present

## 2023-04-15 DIAGNOSIS — C7901 Secondary malignant neoplasm of right kidney and renal pelvis: Secondary | ICD-10-CM | POA: Diagnosis not present

## 2023-04-15 DIAGNOSIS — K5730 Diverticulosis of large intestine without perforation or abscess without bleeding: Secondary | ICD-10-CM | POA: Diagnosis not present

## 2023-04-15 DIAGNOSIS — C7951 Secondary malignant neoplasm of bone: Secondary | ICD-10-CM | POA: Diagnosis not present

## 2023-04-15 DIAGNOSIS — Z7984 Long term (current) use of oral hypoglycemic drugs: Secondary | ICD-10-CM | POA: Diagnosis not present

## 2023-04-15 DIAGNOSIS — J449 Chronic obstructive pulmonary disease, unspecified: Secondary | ICD-10-CM | POA: Diagnosis not present

## 2023-04-15 DIAGNOSIS — Z96643 Presence of artificial hip joint, bilateral: Secondary | ICD-10-CM | POA: Diagnosis not present

## 2023-04-15 DIAGNOSIS — S0101XD Laceration without foreign body of scalp, subsequent encounter: Secondary | ICD-10-CM | POA: Diagnosis not present

## 2023-04-15 DIAGNOSIS — N319 Neuromuscular dysfunction of bladder, unspecified: Secondary | ICD-10-CM | POA: Diagnosis not present

## 2023-04-15 DIAGNOSIS — E1140 Type 2 diabetes mellitus with diabetic neuropathy, unspecified: Secondary | ICD-10-CM | POA: Diagnosis not present

## 2023-04-15 DIAGNOSIS — D500 Iron deficiency anemia secondary to blood loss (chronic): Secondary | ICD-10-CM | POA: Diagnosis not present

## 2023-04-15 DIAGNOSIS — C801 Malignant (primary) neoplasm, unspecified: Secondary | ICD-10-CM | POA: Diagnosis not present

## 2023-04-15 DIAGNOSIS — C7902 Secondary malignant neoplasm of left kidney and renal pelvis: Secondary | ICD-10-CM | POA: Diagnosis not present

## 2023-04-15 DIAGNOSIS — Z8673 Personal history of transient ischemic attack (TIA), and cerebral infarction without residual deficits: Secondary | ICD-10-CM | POA: Diagnosis not present

## 2023-04-15 DIAGNOSIS — N3281 Overactive bladder: Secondary | ICD-10-CM | POA: Diagnosis not present

## 2023-04-15 DIAGNOSIS — E785 Hyperlipidemia, unspecified: Secondary | ICD-10-CM | POA: Diagnosis not present

## 2023-04-15 DIAGNOSIS — M109 Gout, unspecified: Secondary | ICD-10-CM | POA: Diagnosis not present

## 2023-04-15 NOTE — Telephone Encounter (Signed)
Gave verbal order to [facility] [phone] to [facility] for physical therapy once a week for 1 week

## 2023-04-20 ENCOUNTER — Other Ambulatory Visit: Payer: Self-pay | Admitting: Nurse Practitioner

## 2023-04-20 DIAGNOSIS — E1159 Type 2 diabetes mellitus with other circulatory complications: Secondary | ICD-10-CM

## 2023-04-21 DIAGNOSIS — I4891 Unspecified atrial fibrillation: Secondary | ICD-10-CM | POA: Diagnosis not present

## 2023-04-21 DIAGNOSIS — J449 Chronic obstructive pulmonary disease, unspecified: Secondary | ICD-10-CM | POA: Diagnosis not present

## 2023-04-21 DIAGNOSIS — I10 Essential (primary) hypertension: Secondary | ICD-10-CM | POA: Diagnosis not present

## 2023-04-21 DIAGNOSIS — E1140 Type 2 diabetes mellitus with diabetic neuropathy, unspecified: Secondary | ICD-10-CM | POA: Diagnosis not present

## 2023-04-21 DIAGNOSIS — M064 Inflammatory polyarthropathy: Secondary | ICD-10-CM | POA: Diagnosis not present

## 2023-04-21 DIAGNOSIS — S0101XD Laceration without foreign body of scalp, subsequent encounter: Secondary | ICD-10-CM | POA: Diagnosis not present

## 2023-04-22 DIAGNOSIS — J449 Chronic obstructive pulmonary disease, unspecified: Secondary | ICD-10-CM | POA: Diagnosis not present

## 2023-04-22 DIAGNOSIS — S0101XD Laceration without foreign body of scalp, subsequent encounter: Secondary | ICD-10-CM | POA: Diagnosis not present

## 2023-04-22 DIAGNOSIS — M064 Inflammatory polyarthropathy: Secondary | ICD-10-CM | POA: Diagnosis not present

## 2023-04-22 DIAGNOSIS — E1140 Type 2 diabetes mellitus with diabetic neuropathy, unspecified: Secondary | ICD-10-CM | POA: Diagnosis not present

## 2023-04-22 DIAGNOSIS — I10 Essential (primary) hypertension: Secondary | ICD-10-CM | POA: Diagnosis not present

## 2023-04-22 DIAGNOSIS — I4891 Unspecified atrial fibrillation: Secondary | ICD-10-CM | POA: Diagnosis not present

## 2023-04-25 DIAGNOSIS — E1140 Type 2 diabetes mellitus with diabetic neuropathy, unspecified: Secondary | ICD-10-CM | POA: Diagnosis not present

## 2023-04-25 DIAGNOSIS — J449 Chronic obstructive pulmonary disease, unspecified: Secondary | ICD-10-CM | POA: Diagnosis not present

## 2023-04-25 DIAGNOSIS — I4891 Unspecified atrial fibrillation: Secondary | ICD-10-CM | POA: Diagnosis not present

## 2023-04-25 DIAGNOSIS — S0101XD Laceration without foreign body of scalp, subsequent encounter: Secondary | ICD-10-CM | POA: Diagnosis not present

## 2023-04-25 DIAGNOSIS — I10 Essential (primary) hypertension: Secondary | ICD-10-CM | POA: Diagnosis not present

## 2023-04-25 DIAGNOSIS — M064 Inflammatory polyarthropathy: Secondary | ICD-10-CM | POA: Diagnosis not present

## 2023-05-05 DIAGNOSIS — I4891 Unspecified atrial fibrillation: Secondary | ICD-10-CM | POA: Diagnosis not present

## 2023-05-05 DIAGNOSIS — J449 Chronic obstructive pulmonary disease, unspecified: Secondary | ICD-10-CM | POA: Diagnosis not present

## 2023-05-05 DIAGNOSIS — I10 Essential (primary) hypertension: Secondary | ICD-10-CM | POA: Diagnosis not present

## 2023-05-05 DIAGNOSIS — E1140 Type 2 diabetes mellitus with diabetic neuropathy, unspecified: Secondary | ICD-10-CM | POA: Diagnosis not present

## 2023-05-05 DIAGNOSIS — S0101XD Laceration without foreign body of scalp, subsequent encounter: Secondary | ICD-10-CM | POA: Diagnosis not present

## 2023-05-05 DIAGNOSIS — M064 Inflammatory polyarthropathy: Secondary | ICD-10-CM | POA: Diagnosis not present

## 2023-05-06 DIAGNOSIS — J449 Chronic obstructive pulmonary disease, unspecified: Secondary | ICD-10-CM | POA: Diagnosis not present

## 2023-05-06 DIAGNOSIS — I4891 Unspecified atrial fibrillation: Secondary | ICD-10-CM | POA: Diagnosis not present

## 2023-05-06 DIAGNOSIS — E1140 Type 2 diabetes mellitus with diabetic neuropathy, unspecified: Secondary | ICD-10-CM | POA: Diagnosis not present

## 2023-05-06 DIAGNOSIS — S0101XD Laceration without foreign body of scalp, subsequent encounter: Secondary | ICD-10-CM | POA: Diagnosis not present

## 2023-05-06 DIAGNOSIS — I10 Essential (primary) hypertension: Secondary | ICD-10-CM | POA: Diagnosis not present

## 2023-05-06 DIAGNOSIS — M064 Inflammatory polyarthropathy: Secondary | ICD-10-CM | POA: Diagnosis not present

## 2023-05-09 DIAGNOSIS — I10 Essential (primary) hypertension: Secondary | ICD-10-CM | POA: Diagnosis not present

## 2023-05-09 DIAGNOSIS — I4891 Unspecified atrial fibrillation: Secondary | ICD-10-CM | POA: Diagnosis not present

## 2023-05-09 DIAGNOSIS — J449 Chronic obstructive pulmonary disease, unspecified: Secondary | ICD-10-CM | POA: Diagnosis not present

## 2023-05-09 DIAGNOSIS — M064 Inflammatory polyarthropathy: Secondary | ICD-10-CM | POA: Diagnosis not present

## 2023-05-09 DIAGNOSIS — E1140 Type 2 diabetes mellitus with diabetic neuropathy, unspecified: Secondary | ICD-10-CM | POA: Diagnosis not present

## 2023-05-09 DIAGNOSIS — S0101XD Laceration without foreign body of scalp, subsequent encounter: Secondary | ICD-10-CM | POA: Diagnosis not present

## 2023-05-10 DIAGNOSIS — M064 Inflammatory polyarthropathy: Secondary | ICD-10-CM | POA: Diagnosis not present

## 2023-05-10 DIAGNOSIS — I4891 Unspecified atrial fibrillation: Secondary | ICD-10-CM | POA: Diagnosis not present

## 2023-05-10 DIAGNOSIS — I10 Essential (primary) hypertension: Secondary | ICD-10-CM | POA: Diagnosis not present

## 2023-05-10 DIAGNOSIS — S0101XD Laceration without foreign body of scalp, subsequent encounter: Secondary | ICD-10-CM | POA: Diagnosis not present

## 2023-05-10 DIAGNOSIS — E1140 Type 2 diabetes mellitus with diabetic neuropathy, unspecified: Secondary | ICD-10-CM | POA: Diagnosis not present

## 2023-05-10 DIAGNOSIS — J449 Chronic obstructive pulmonary disease, unspecified: Secondary | ICD-10-CM | POA: Diagnosis not present

## 2023-05-15 DIAGNOSIS — E1140 Type 2 diabetes mellitus with diabetic neuropathy, unspecified: Secondary | ICD-10-CM | POA: Diagnosis not present

## 2023-05-15 DIAGNOSIS — J449 Chronic obstructive pulmonary disease, unspecified: Secondary | ICD-10-CM | POA: Diagnosis not present

## 2023-05-15 DIAGNOSIS — I4891 Unspecified atrial fibrillation: Secondary | ICD-10-CM | POA: Diagnosis not present

## 2023-05-15 DIAGNOSIS — S0101XD Laceration without foreign body of scalp, subsequent encounter: Secondary | ICD-10-CM | POA: Diagnosis not present

## 2023-05-15 DIAGNOSIS — Z8673 Personal history of transient ischemic attack (TIA), and cerebral infarction without residual deficits: Secondary | ICD-10-CM | POA: Diagnosis not present

## 2023-05-15 DIAGNOSIS — M064 Inflammatory polyarthropathy: Secondary | ICD-10-CM | POA: Diagnosis not present

## 2023-05-15 DIAGNOSIS — C7902 Secondary malignant neoplasm of left kidney and renal pelvis: Secondary | ICD-10-CM | POA: Diagnosis not present

## 2023-05-15 DIAGNOSIS — G2581 Restless legs syndrome: Secondary | ICD-10-CM | POA: Diagnosis not present

## 2023-05-15 DIAGNOSIS — E785 Hyperlipidemia, unspecified: Secondary | ICD-10-CM | POA: Diagnosis not present

## 2023-05-15 DIAGNOSIS — C7981 Secondary malignant neoplasm of breast: Secondary | ICD-10-CM | POA: Diagnosis not present

## 2023-05-15 DIAGNOSIS — E538 Deficiency of other specified B group vitamins: Secondary | ICD-10-CM | POA: Diagnosis not present

## 2023-05-15 DIAGNOSIS — C801 Malignant (primary) neoplasm, unspecified: Secondary | ICD-10-CM | POA: Diagnosis not present

## 2023-05-15 DIAGNOSIS — N3281 Overactive bladder: Secondary | ICD-10-CM | POA: Diagnosis not present

## 2023-05-15 DIAGNOSIS — C7951 Secondary malignant neoplasm of bone: Secondary | ICD-10-CM | POA: Diagnosis not present

## 2023-05-15 DIAGNOSIS — C7901 Secondary malignant neoplasm of right kidney and renal pelvis: Secondary | ICD-10-CM | POA: Diagnosis not present

## 2023-05-15 DIAGNOSIS — K5730 Diverticulosis of large intestine without perforation or abscess without bleeding: Secondary | ICD-10-CM | POA: Diagnosis not present

## 2023-05-15 DIAGNOSIS — C7801 Secondary malignant neoplasm of right lung: Secondary | ICD-10-CM | POA: Diagnosis not present

## 2023-05-15 DIAGNOSIS — Z7984 Long term (current) use of oral hypoglycemic drugs: Secondary | ICD-10-CM | POA: Diagnosis not present

## 2023-05-15 DIAGNOSIS — D500 Iron deficiency anemia secondary to blood loss (chronic): Secondary | ICD-10-CM | POA: Diagnosis not present

## 2023-05-15 DIAGNOSIS — Z96643 Presence of artificial hip joint, bilateral: Secondary | ICD-10-CM | POA: Diagnosis not present

## 2023-05-15 DIAGNOSIS — I10 Essential (primary) hypertension: Secondary | ICD-10-CM | POA: Diagnosis not present

## 2023-05-15 DIAGNOSIS — M109 Gout, unspecified: Secondary | ICD-10-CM | POA: Diagnosis not present

## 2023-05-15 DIAGNOSIS — Z8701 Personal history of pneumonia (recurrent): Secondary | ICD-10-CM | POA: Diagnosis not present

## 2023-05-15 DIAGNOSIS — N319 Neuromuscular dysfunction of bladder, unspecified: Secondary | ICD-10-CM | POA: Diagnosis not present

## 2023-05-15 DIAGNOSIS — C7802 Secondary malignant neoplasm of left lung: Secondary | ICD-10-CM | POA: Diagnosis not present

## 2023-05-17 DIAGNOSIS — I10 Essential (primary) hypertension: Secondary | ICD-10-CM | POA: Diagnosis not present

## 2023-05-17 DIAGNOSIS — E1140 Type 2 diabetes mellitus with diabetic neuropathy, unspecified: Secondary | ICD-10-CM | POA: Diagnosis not present

## 2023-05-17 DIAGNOSIS — I4891 Unspecified atrial fibrillation: Secondary | ICD-10-CM | POA: Diagnosis not present

## 2023-05-17 DIAGNOSIS — M064 Inflammatory polyarthropathy: Secondary | ICD-10-CM | POA: Diagnosis not present

## 2023-05-17 DIAGNOSIS — J449 Chronic obstructive pulmonary disease, unspecified: Secondary | ICD-10-CM | POA: Diagnosis not present

## 2023-05-17 DIAGNOSIS — S0101XD Laceration without foreign body of scalp, subsequent encounter: Secondary | ICD-10-CM | POA: Diagnosis not present

## 2023-05-22 ENCOUNTER — Other Ambulatory Visit: Payer: Self-pay | Admitting: Nurse Practitioner

## 2023-05-22 DIAGNOSIS — Z0001 Encounter for general adult medical examination with abnormal findings: Secondary | ICD-10-CM

## 2023-05-23 ENCOUNTER — Encounter: Payer: Self-pay | Admitting: Nurse Practitioner

## 2023-05-23 ENCOUNTER — Ambulatory Visit (INDEPENDENT_AMBULATORY_CARE_PROVIDER_SITE_OTHER): Payer: Medicare Other | Admitting: Nurse Practitioner

## 2023-05-23 VITALS — BP 135/60 | HR 68 | Temp 98.3°F | Resp 16 | Ht 64.0 in | Wt 137.0 lb

## 2023-05-23 DIAGNOSIS — E1159 Type 2 diabetes mellitus with other circulatory complications: Secondary | ICD-10-CM

## 2023-05-23 DIAGNOSIS — K5909 Other constipation: Secondary | ICD-10-CM | POA: Diagnosis not present

## 2023-05-23 DIAGNOSIS — R400 Somnolence: Secondary | ICD-10-CM | POA: Diagnosis not present

## 2023-05-23 LAB — POCT GLYCOSYLATED HEMOGLOBIN (HGB A1C): Hemoglobin A1C: 6.3 % — AB (ref 4.0–5.6)

## 2023-05-23 MED ORDER — LUBIPROSTONE 8 MCG PO CAPS
8.0000 ug | ORAL_CAPSULE | Freq: Two times a day (BID) | ORAL | 3 refills | Status: AC
Start: 2023-05-23 — End: ?

## 2023-05-23 NOTE — Progress Notes (Unsigned)
[facility] [address] [place], [place] [address]  Internal MEDICINE  Office Visit Note  Patient Name: Monica [name]  [id number]  [id number]  Date of Service: 05/23/2023  Chief Complaint  Patient presents with   Diabetes   Hypertension   Hyperlipidemia   Follow-up    HPI Monica [name] presents for a follow-up visit for  Sleepy a lot  Constipation --  Diabetes -- A1c improved to 6.3 since last visit.     Current Medication: Outpatient Encounter Medications as of 05/23/2023  Medication Sig   albuterol (VENTOLIN HFA) 108 (90 Base) MCG/ACT inhaler INHALE TWO PUFFS INTO THE LUNGS EVERY 4 HOURS AS NEEDED FOR WHEEZING OR SHORTNESS OF BREATH   amLODipine-benazepril (LOTREL) 10-20 MG capsule Take 1 capsule by mouth daily.   apixaban (ELIQUIS) 2.5 MG TABS tablet Take 1 tablet (2.5 mg total) by mouth 2 (two) times daily.   clotrimazole-betamethasone (LOTRISONE) cream Apply 1 Application topically 2 (two) times daily.   feeding supplement (BOOST HIGH PROTEIN) LIQD Take 1 Container by mouth as needed.   gabapentin (NEURONTIN) 300 MG capsule Take 2 capsules (600 mg total) by mouth at bedtime.   glucose blood (ONETOUCH ULTRA) test strip 1 each by Other route daily. Use as instructed   lubiprostone (AMITIZA) 8 MCG capsule Take 1 capsule (8 mcg total) by mouth 2 (two) times daily with a meal.   meloxicam (MOBIC) 7.5 MG tablet Take 1 tablet (7.5 mg total) by mouth daily.   metFORMIN (GLUCOPHAGE-XR) 500 MG 24 hr tablet TAKE ONE (1) TABLET BY MOUTH TWO TIMES PER DAY WITH A MEAL   montelukast (SINGULAIR) 10 MG tablet TAKE ONE TABLET (10 MG) BY MOUTH AT BEDTIME   mupirocin ointment (BACTROBAN) 2 % Apply 1 Application topically daily. To wound on buttocks until healed.   nystatin (MYCOSTATIN/NYSTOP) powder Apply 1 Application topically 3 (three) times daily. To groin area until rash resolves.   OneTouch Delica Lancets 30G MISC Use as directed once daily as needed   oxybutynin (DITROPAN-XL) 10  MG 24 hr tablet TAKE ONE TABLET BY MOUTH EVERY DAY   THEO-24 100 MG 24 hr capsule Take 1 capsule (100 mg total) by mouth daily.   [DISCONTINUED] famotidine (PEPCID) 20 MG tablet Take 1 tablet (20 mg total) by mouth 2 (two) times daily.   [DISCONTINUED] pravastatin (PRAVACHOL) 40 MG tablet Take 1 tablet (40 mg total) by mouth daily.   No facility-administered encounter medications on file as of 05/23/2023.    Surgical History: Past Surgical History:  Procedure Laterality Date   ABDOMINAL HYSTERECTOMY  1975   BLADDER SUSPENSION  1989   CAROTID ANGIOGRAPHY Right 11/23/2019   Procedure: CAROTID ANGIOGRAPHY;  Surgeon: Renford Dills, MD;  Location: [facility] INVASIVE CV LAB;  Service: Cardiovascular;  Laterality: Right;   COLONOSCOPY     COLONOSCOPY WITH PROPOFOL N/A 01/25/2018   Procedure: COLONOSCOPY WITH PROPOFOL;  Surgeon: Midge Minium, MD;  Location: [facility] ENDOSCOPY;  Service: Endoscopy;  Laterality: N/A;   ESOPHAGOGASTRODUODENOSCOPY (EGD) WITH PROPOFOL N/A 01/24/2018   Procedure: ESOPHAGOGASTRODUODENOSCOPY (EGD) WITH PROPOFOL;  Surgeon: Midge Minium, MD;  Location: [facility] ENDOSCOPY;  Service: Endoscopy;  Laterality: N/A;   HIP ARTHROPLASTY Left 06/03/2019   Procedure: ARTHROPLASTY BIPOLAR HIP (HEMIARTHROPLASTY);  Surgeon: Donato Heinz, MD;  Location: [facility] ORS;  Service: Orthopedics;  Laterality: Left;   HIP ARTHROPLASTY Right 04/21/2020   Procedure: ARTHROPLASTY BIPOLAR HIP (HEMIARTHROPLASTY);  Surgeon: Christena Flake, MD;  Location: [facility] ORS;  Service: Orthopedics;  Laterality: Right;  Medical History: Past Medical History:  Diagnosis Date   Atrial fibrillation (HCC)    Bronchitis    Cancer (HCC)    Left leg growth, kidneys, lungs and breasts   Carcinoma of unknown primary (HCC)    COPD (chronic obstructive pulmonary disease) (HCC)    Diabetes mellitus without complication (HCC)    Hyperlipidemia    Hypertension    Pancreatitis    Pneumonia    Stroke (HCC)    TIA's   Vitamin B12  deficiency     Family History: Family History  Problem Relation Age of Onset   Cancer Father    Diabetes Brother     Social History   Socioeconomic History   Marital status: Divorced    Spouse name: Not on file   Number of children: Not on file   Years of education: Not on file   Highest education level: Not on file  Occupational History   Not on file  Tobacco Use   Smoking status: Former    Packs/day: 0.50    Years: 15.00    Additional pack years: 0.00    Total pack years: 7.50    Types: Cigarettes    Quit date: 11/15/1988    Years since quitting: 34.5   Smokeless tobacco: Never  Vaping Use   Vaping Use: Never used  Substance and Sexual Activity   Alcohol use: No    Alcohol/week: 0.0 standard drinks of alcohol   Drug use: No   Sexual activity: Not on file    Comment: hysterectomy   Other Topics Concern   Not on file  Social History Narrative   Not on file   Social Determinants of Health   Financial Resource Strain: Not on file  Food Insecurity: Not on file  Transportation Needs: Not on file  Physical Activity: Not on file  Stress: Not on file  Social Connections: Not on file  Intimate Partner Violence: Not on file      Review of Systems  Vital Signs: BP 135/60 Comment: 170/68  Pulse 68   Temp 98.3 F (36.8 C)   Resp 16   Ht 5\' 4"  (1.626 m)   Wt 137 lb (62.1 kg)   SpO2 97%   BMI 23.52 kg/m    Physical Exam     Assessment/Plan:   General Counseling: Lyzbeth verbalizes understanding of the findings of todays visit and agrees with plan of treatment. I have discussed any further diagnostic evaluation that may be needed or ordered today. We also reviewed her medications today. she has been encouraged to call the office with any questions or concerns that should arise related to todays visit.    Orders Placed This Encounter  Procedures   POCT glycosylated hemoglobin (Hb A1C)    Meds ordered this encounter  Medications   lubiprostone  (AMITIZA) 8 MCG capsule    Sig: Take 1 capsule (8 mcg total) by mouth 2 (two) times daily with a meal.    Dispense:  60 capsule    Refill:  3    Fill new script today.    Return in about 3 months (around 08/23/2023) for F/U, Shelvie Salsberry PCP.   Total time spent:*** Minutes Time spent includes review of chart, medications, test results, and follow up plan with the patient.    Controlled Substance Database was reviewed by me.  This patient was seen by Sallyanne Kuster, FNP-C in collaboration with Dr. Beverely Risen as a part of collaborative care agreement.   Everlene Cunning R. Domanique Huesman,  MSN, FNP-C Internal medicine

## 2023-05-24 ENCOUNTER — Encounter: Payer: Self-pay | Admitting: Nurse Practitioner

## 2023-05-24 DIAGNOSIS — S0101XD Laceration without foreign body of scalp, subsequent encounter: Secondary | ICD-10-CM | POA: Diagnosis not present

## 2023-05-24 DIAGNOSIS — M064 Inflammatory polyarthropathy: Secondary | ICD-10-CM | POA: Diagnosis not present

## 2023-05-24 DIAGNOSIS — I10 Essential (primary) hypertension: Secondary | ICD-10-CM | POA: Diagnosis not present

## 2023-05-24 DIAGNOSIS — J449 Chronic obstructive pulmonary disease, unspecified: Secondary | ICD-10-CM | POA: Diagnosis not present

## 2023-05-24 DIAGNOSIS — I4891 Unspecified atrial fibrillation: Secondary | ICD-10-CM | POA: Diagnosis not present

## 2023-05-24 DIAGNOSIS — E1140 Type 2 diabetes mellitus with diabetic neuropathy, unspecified: Secondary | ICD-10-CM | POA: Diagnosis not present

## 2023-05-31 DIAGNOSIS — J449 Chronic obstructive pulmonary disease, unspecified: Secondary | ICD-10-CM | POA: Diagnosis not present

## 2023-05-31 DIAGNOSIS — S0101XD Laceration without foreign body of scalp, subsequent encounter: Secondary | ICD-10-CM | POA: Diagnosis not present

## 2023-05-31 DIAGNOSIS — E1140 Type 2 diabetes mellitus with diabetic neuropathy, unspecified: Secondary | ICD-10-CM | POA: Diagnosis not present

## 2023-05-31 DIAGNOSIS — I4891 Unspecified atrial fibrillation: Secondary | ICD-10-CM | POA: Diagnosis not present

## 2023-05-31 DIAGNOSIS — I10 Essential (primary) hypertension: Secondary | ICD-10-CM | POA: Diagnosis not present

## 2023-05-31 DIAGNOSIS — M064 Inflammatory polyarthropathy: Secondary | ICD-10-CM | POA: Diagnosis not present

## 2023-06-07 DIAGNOSIS — I10 Essential (primary) hypertension: Secondary | ICD-10-CM | POA: Diagnosis not present

## 2023-06-07 DIAGNOSIS — I4891 Unspecified atrial fibrillation: Secondary | ICD-10-CM | POA: Diagnosis not present

## 2023-06-07 DIAGNOSIS — M064 Inflammatory polyarthropathy: Secondary | ICD-10-CM | POA: Diagnosis not present

## 2023-06-07 DIAGNOSIS — E1140 Type 2 diabetes mellitus with diabetic neuropathy, unspecified: Secondary | ICD-10-CM | POA: Diagnosis not present

## 2023-06-07 DIAGNOSIS — S0101XD Laceration without foreign body of scalp, subsequent encounter: Secondary | ICD-10-CM | POA: Diagnosis not present

## 2023-06-07 DIAGNOSIS — J449 Chronic obstructive pulmonary disease, unspecified: Secondary | ICD-10-CM | POA: Diagnosis not present

## 2023-06-28 ENCOUNTER — Other Ambulatory Visit: Payer: Self-pay | Admitting: Nurse Practitioner

## 2023-06-28 DIAGNOSIS — B372 Candidiasis of skin and nail: Secondary | ICD-10-CM

## 2023-07-04 ENCOUNTER — Telehealth: Payer: Self-pay | Admitting: Nurse Practitioner

## 2023-07-04 NOTE — Telephone Encounter (Signed)
Received therapy order form from [facility]. Gave to AA for signature. Faxed back; [phone]

## 2023-07-10 ENCOUNTER — Emergency Department: Payer: Medicare Other

## 2023-07-10 ENCOUNTER — Emergency Department
Admission: EM | Admit: 2023-07-10 | Discharge: 2023-07-10 | Disposition: A | Discharge status: Home / Self Care | Payer: Medicare Other | Attending: Emergency Medicine | Admitting: Emergency Medicine

## 2023-07-10 ENCOUNTER — Other Ambulatory Visit: Payer: Self-pay

## 2023-07-10 DIAGNOSIS — S0101XA Laceration without foreign body of scalp, initial encounter: Secondary | ICD-10-CM | POA: Diagnosis not present

## 2023-07-10 DIAGNOSIS — Z23 Encounter for immunization: Secondary | ICD-10-CM | POA: Diagnosis not present

## 2023-07-10 DIAGNOSIS — W19XXXA Unspecified fall, initial encounter: Secondary | ICD-10-CM | POA: Diagnosis not present

## 2023-07-10 DIAGNOSIS — R519 Headache, unspecified: Secondary | ICD-10-CM | POA: Diagnosis not present

## 2023-07-10 DIAGNOSIS — Z7901 Long term (current) use of anticoagulants: Secondary | ICD-10-CM | POA: Insufficient documentation

## 2023-07-10 DIAGNOSIS — Y92129 Unspecified place in nursing home as the place of occurrence of the external cause: Secondary | ICD-10-CM | POA: Insufficient documentation

## 2023-07-10 DIAGNOSIS — W01198A Fall on same level from slipping, tripping and stumbling with subsequent striking against other object, initial encounter: Secondary | ICD-10-CM | POA: Diagnosis not present

## 2023-07-10 DIAGNOSIS — R9082 White matter disease, unspecified: Secondary | ICD-10-CM | POA: Diagnosis not present

## 2023-07-10 DIAGNOSIS — R58 Hemorrhage, not elsewhere classified: Secondary | ICD-10-CM | POA: Diagnosis not present

## 2023-07-10 DIAGNOSIS — S0191XA Laceration without foreign body of unspecified part of head, initial encounter: Secondary | ICD-10-CM | POA: Diagnosis not present

## 2023-07-10 DIAGNOSIS — I10 Essential (primary) hypertension: Secondary | ICD-10-CM | POA: Diagnosis not present

## 2023-07-10 MED ORDER — LIDOCAINE-EPINEPHRINE-TETRACAINE (LET) TOPICAL GEL
3.0000 mL | Freq: Once | TOPICAL | Status: AC
  Administered 2023-07-10: 3 mL via TOPICAL
  Filled 2023-07-10: qty 3

## 2023-07-10 MED ORDER — LIDOCAINE-EPINEPHRINE-TETRACAINE (LET) TOPICAL GEL: 3.0000 mL | Freq: Once | TOPICAL | Status: DC

## 2023-07-10 MED ORDER — TETANUS-DIPHTH-ACELL PERTUSSIS 5-2.5-18.5 LF-MCG/0.5 IM SUSY
0.5000 mL | PREFILLED_SYRINGE | Freq: Once | INTRAMUSCULAR | Status: AC
  Administered 2023-07-10: 0.5 mL via INTRAMUSCULAR
  Filled 2023-07-10: qty 0.5

## 2023-07-10 NOTE — ED Notes (Signed)
Laceration cleaned by this RN, lidocaine applied.

## 2023-07-10 NOTE — ED Triage Notes (Signed)
Pt brought in by EMS from [facility] for a fall. Per EMS, pt was trying to readjust a pillow when she fell and hit the back of her head on the corner of a bookshelf. Pt presents with an approx. 2" laceration to back of her head; bleeding is controlled upon arrival to ED. Pt is alert and oriented x 4; in NAD. Pt complains of pain to back of head; rates it 6/10. No other complaints of pain.

## 2023-07-10 NOTE — ED Provider Notes (Signed)
[facility] Provider Note    Event Date/Time   First MD Initiated Contact with Patient 07/10/23 1506     (approximate)   History   Fall and Laceration   HPI Monica [name] is a 89 y.o. female on Eliquis for A-fib who presents today for a fall.  Patient states she was moving things around on the couch when she leaned back and lost her balance.  She fell backwards hitting the back of her head.  She did not lose consciousness.  Only notes pain in the back of her head.  Denies pain throughout her neck, back, chest, abdomen, bilateral upper and lower extremities.  Has ambulated since this happened.  Lives in a nursing home with prior history of falls.     Physical Exam   Triage Vital Signs: ED Triage Vitals  Encounter Vitals Group     BP 07/10/23 1435 (!) 184/83     Systolic BP Percentile --      Diastolic BP Percentile --      Pulse Rate 07/10/23 1435 75     Resp --      Temp 07/10/23 1435 98.2 F (36.8 C)     Temp Source 07/10/23 1435 Oral     SpO2 07/10/23 1435 98 %     Weight 07/10/23 1443 142 lb (64.4 kg)     Height 07/10/23 1443 5\' 4"  (1.626 m)     Head Circumference --      Peak Flow --      Pain Score 07/10/23 1435 6     Pain Loc --      Pain Education --      Exclude from Growth Chart --     Most recent vital signs: Vitals:   07/10/23 1435  BP: (!) 184/83  Pulse: 75  Temp: 98.2 F (36.8 C)  SpO2: 98%   Physical Exam: I have reviewed the vital signs and nursing notes. General: Awake, alert, no acute distress.  Nontoxic appearing. Head: 3 cm laceration to the posterior scalp with minimal bleeding at this time, normocephalic.   ENT:  EOM intact, PERRL. Oral mucosa is pink and moist with no lesions. Neck: Neck is supple with full range of motion, No meningeal signs.  No C-spine tenderness to palpation. Cardiovascular:  RRR, No murmurs. Peripheral pulses palpable and equal bilaterally. Respiratory:  Symmetrical chest wall expansion.   No rhonchi, rales, or wheezes.  Good air movement throughout.  No use of accessory muscles.   Musculoskeletal:  No cyanosis or edema. Moving extremities with full ROM.  No T or L-spine tenderness palpation.  No tenderness to palpation throughout bilateral upper and lower extremities.  No chest wall tenderness to palpation. Abdomen:  Soft, nontender, nondistended. Neuro:  GCS 15, moving all four extremities, interacting appropriately. Speech clear. Psych:  Calm, appropriate.   Skin:  Warm, dry, no rash.     ED Results / Procedures / Treatments   Labs (all labs ordered are listed, but only abnormal results are displayed) Labs Reviewed - No data to display   EKG    RADIOLOGY See ED course for my independent interpretations   PROCEDURES:  Critical Care performed: No  ..Laceration Repair  Date/Time: 07/10/2023 5:25 PM  Performed by: Janith Lima, MD Authorized by: Janith Lima, MD   Consent:    Consent obtained:  Verbal   Consent given by:  Patient   Risks, benefits, and alternatives were discussed: yes  Risks discussed:  Infection, pain, poor cosmetic result and need for additional repair Universal protocol:    Patient identity confirmed:  Arm band and verbally with patient Anesthesia:    Anesthesia method:  Topical application   Topical anesthetic:  LET Laceration details:    Location:  Scalp   Scalp location:  Occipital   Length (cm):  4   Depth (mm):  3 Pre-procedure details:    Preparation:  Patient was prepped and draped in usual sterile fashion and imaging obtained to evaluate for foreign bodies Exploration:    Hemostasis achieved with:  Direct pressure and LET   Imaging outcome: foreign body not noted     Wound exploration: wound explored through full range of motion   Treatment:    Area cleansed with:  Chlorhexidine and saline   Amount of cleaning:  Standard   Irrigation solution:  Sterile saline   Debridement:  Minimal   Undermining:  None Skin  repair:    Repair method:  Staples   Number of staples:  9 Approximation:    Approximation:  Close Repair type:    Repair type:  Simple Post-procedure details:    Dressing:  Open (no dressing)   Procedure completion:  Tolerated well, no immediate complications    MEDICATIONS ORDERED IN ED: Medications  Tdap (BOOSTRIX) injection 0.5 mL (has no administration in time range)  lidocaine-EPINEPHrine-tetracaine (LET) topical gel (3 mLs Topical Given 07/10/23 1633)     IMPRESSION / MDM / ASSESSMENT AND PLAN / ED COURSE  I reviewed the triage vital signs and the nursing notes.                              Differential diagnosis includes, but is not limited to, scalp laceration, scalp hematoma, SAH, SDH, EDH.  Patient's presentation is most consistent with acute presentation with potential threat to life or bodily function.  Patient is a 89 year old female presenting today for ground-level fall with head injury.  CT imaging of the head was negative for internal injuries as well as negative for cervical spine injuries.  Laceration was repaired to the posterior scalp with 9 staples.  Patient safe for discharge back to her facility with family in agreement at bedside.  Plan for outpatient follow-up with PCP given strict return precautions.  The patient is on the cardiac monitor to evaluate for evidence of arrhythmia and/or significant heart rate changes. Clinical Course as of 07/10/23 1725  Sun Jul 10, 2023  1600 CT Head Wo Contrast No acute intracranial pathology or cervical spine injury per my interpretation [DW]    Clinical Course User Index [DW] Janith Lima, MD     FINAL CLINICAL IMPRESSION(S) / ED DIAGNOSES   Final diagnoses:  Laceration of scalp without foreign body, initial encounter  Fall, initial encounter     Rx / DC Orders   ED Discharge Orders     None        Note:  This document was prepared using Dragon voice recognition software and may include  unintentional dictation errors.   Janith Lima, MD 07/10/23 [phone]

## 2023-07-10 NOTE — Discharge Instructions (Signed)
You are seen in the emergency department today for your fall with head injury.  CT imaging of your head and neck was reassuring.  You do have 9 staples in the back of your head that will need to be removed in 7 to 10 days.  Please follow-up with your primary care provider for this.  Please return for any worsening symptoms.

## 2023-07-12 ENCOUNTER — Telehealth: Payer: Self-pay | Admitting: Nurse Practitioner

## 2023-07-12 DIAGNOSIS — R296 Repeated falls: Secondary | ICD-10-CM | POA: Diagnosis not present

## 2023-07-12 DIAGNOSIS — R2689 Other abnormalities of gait and mobility: Secondary | ICD-10-CM | POA: Diagnosis not present

## 2023-07-12 DIAGNOSIS — R2681 Unsteadiness on feet: Secondary | ICD-10-CM | POA: Diagnosis not present

## 2023-07-12 NOTE — Telephone Encounter (Signed)
Received PT Evaluation plan of treatment. Gave to AA for signature-nm

## 2023-07-13 ENCOUNTER — Telehealth: Payer: Self-pay | Admitting: Nurse Practitioner

## 2023-07-13 NOTE — Telephone Encounter (Signed)
PT order signed, faxed back to [facility]; [phone]. Scanned-Toni

## 2023-07-14 DIAGNOSIS — R2689 Other abnormalities of gait and mobility: Secondary | ICD-10-CM | POA: Diagnosis not present

## 2023-07-14 DIAGNOSIS — R296 Repeated falls: Secondary | ICD-10-CM | POA: Diagnosis not present

## 2023-07-14 DIAGNOSIS — R2681 Unsteadiness on feet: Secondary | ICD-10-CM | POA: Diagnosis not present

## 2023-07-21 ENCOUNTER — Encounter: Payer: Self-pay | Admitting: Nurse Practitioner

## 2023-07-21 ENCOUNTER — Ambulatory Visit (INDEPENDENT_AMBULATORY_CARE_PROVIDER_SITE_OTHER): Payer: Medicare Other | Admitting: Nurse Practitioner

## 2023-07-21 VITALS — BP 135/70 | HR 73 | Temp 98.3°F | Resp 16 | Ht 64.0 in | Wt 125.6 lb

## 2023-07-21 DIAGNOSIS — I10 Essential (primary) hypertension: Secondary | ICD-10-CM

## 2023-07-21 DIAGNOSIS — Z4802 Encounter for removal of sutures: Secondary | ICD-10-CM

## 2023-07-21 DIAGNOSIS — R2689 Other abnormalities of gait and mobility: Secondary | ICD-10-CM | POA: Diagnosis not present

## 2023-07-21 DIAGNOSIS — R634 Abnormal weight loss: Secondary | ICD-10-CM

## 2023-07-21 DIAGNOSIS — R2681 Unsteadiness on feet: Secondary | ICD-10-CM | POA: Diagnosis not present

## 2023-07-21 DIAGNOSIS — R296 Repeated falls: Secondary | ICD-10-CM | POA: Diagnosis not present

## 2023-07-21 DIAGNOSIS — K5909 Other constipation: Secondary | ICD-10-CM

## 2023-07-21 DIAGNOSIS — M6281 Muscle weakness (generalized): Secondary | ICD-10-CM | POA: Diagnosis not present

## 2023-07-21 DIAGNOSIS — M159 Polyosteoarthritis, unspecified: Secondary | ICD-10-CM | POA: Diagnosis not present

## 2023-07-21 NOTE — Progress Notes (Signed)
[facility] [address] [place], [place] [address]  Internal MEDICINE  Office Visit Note  Patient Name: Monica [name]  [id number]  [id number]  Date of Service: 07/21/2023  Chief Complaint  Patient presents with   Diabetes   Hypertension   Hyperlipidemia   Follow-up    ED f/u-----Head injury-removal of 9 staples.     Subjective:  Monica [name] is a 89 y.o. female who obtained a laceration 10 days ago, which required closure with 9 staples. Mechanism of injury: fall. She denies pain, redness, or drainage from the wound. Her last tetanus was 10 days ago on 07/10/2023 when in the ER.   The following portions of the patient's history were reviewed and updated as appropriate: allergies, current medications, past family history, past medical history, past social history, past surgical history, and problem list.  Review of Systems Constitutional: positive for weight loss, negative for chills, fatigue, fevers, night sweats, and sweats Respiratory: negative for cough, hemoptysis, pleurisy/chest pain, pneumonia, stridor, and wheezing Cardiovascular: negative for chest pain, dyspnea, irregular heart beat, lower extremity edema, palpitations, and syncope Neurological: positive for gait problems and weakness, negative for dizziness, headaches, seizures, speech problems, tremors, and vertigo Behavioral/Psych: positive for decreased appetite and good mood, negative for anxiety, depression, irritability, and mood swings   Objective:  BP 135/70   Pulse 73   Temp 98.3 F (36.8 C)   Resp 16   Ht 5\' 4"  (1.626 m)   Wt 125 lb 9.6 oz (57 kg)   SpO2 97%   BMI 21.56 kg/m  Injury exam:  A 3 cm laceration noted on the posterior scalp is healing well, without evidence of infection.   Assessment:  Laceration is healing well, without evidence of infection. Wound edges are approximated. No redness, swelling or drainage noted.    Plan:   1. 9 staples were removed. 2. Wound care  discussed. 3. Follow up as needed.    Also Has Constipation -- amitiza not helping, discussed trying prunes or prune juice and OTC medication options. Unexplained weight loss -- weight loss of 17 lbs in 10 days from 142 lbs to 125 lbs. Had recent blunt force trauma to the head from fall resulting in laceration requiring staples.   Current Medication: Outpatient Encounter Medications as of 07/21/2023  Medication Sig   albuterol (VENTOLIN HFA) 108 (90 Base) MCG/ACT inhaler INHALE TWO PUFFS INTO THE LUNGS EVERY 4 HOURS AS NEEDED FOR WHEEZING OR SHORTNESS OF BREATH   clotrimazole-betamethasone (LOTRISONE) cream Apply 1 Application topically 2 (two) times daily.   lubiprostone (AMITIZA) 8 MCG capsule Take 1 capsule (8 mcg total) by mouth 2 (two) times daily with a meal.   meloxicam (MOBIC) 7.5 MG tablet Take 1 tablet (7.5 mg total) by mouth daily.   NYSTATIN powder APPLY 1 APPLICATION TOPICALLY 3 TIMES DAILY TO GROIN AREA UNTIL RASH RESOLVES   [DISCONTINUED] amLODipine-benazepril (LOTREL) 10-20 MG capsule Take 1 capsule by mouth daily.   [DISCONTINUED] apixaban (ELIQUIS) 2.5 MG TABS tablet Take 1 tablet (2.5 mg total) by mouth 2 (two) times daily.   [DISCONTINUED] feeding supplement (BOOST HIGH PROTEIN) LIQD Take 1 Container by mouth as needed.   [DISCONTINUED] gabapentin (NEURONTIN) 300 MG capsule Take 2 capsules (600 mg total) by mouth at bedtime.   [DISCONTINUED] glucose blood (ONETOUCH ULTRA) test strip 1 each by Other route daily. Use as instructed   [DISCONTINUED] metFORMIN (GLUCOPHAGE-XR) 500 MG 24 hr tablet TAKE ONE (1) TABLET BY MOUTH TWO TIMES PER DAY  WITH A MEAL   [DISCONTINUED] montelukast (SINGULAIR) 10 MG tablet TAKE ONE TABLET (10 MG) BY MOUTH AT BEDTIME   [DISCONTINUED] mupirocin ointment (BACTROBAN) 2 % Apply 1 Application topically daily. To wound on buttocks until healed.   [DISCONTINUED] OneTouch Delica Lancets 30G MISC Use as directed once daily as needed   [DISCONTINUED]  oxybutynin (DITROPAN-XL) 10 MG 24 hr tablet TAKE ONE TABLET BY MOUTH EVERY DAY   [DISCONTINUED] THEO-24 100 MG 24 hr capsule Take 1 capsule (100 mg total) by mouth daily.   [DISCONTINUED] famotidine (PEPCID) 20 MG tablet Take 1 tablet (20 mg total) by mouth 2 (two) times daily.   [DISCONTINUED] pravastatin (PRAVACHOL) 40 MG tablet Take 1 tablet (40 mg total) by mouth daily.   No facility-administered encounter medications on file as of 07/21/2023.    Surgical History: Past Surgical History:  Procedure Laterality Date   ABDOMINAL HYSTERECTOMY  1975   BLADDER SUSPENSION  1989   CAROTID ANGIOGRAPHY Right 11/23/2019   Procedure: CAROTID ANGIOGRAPHY;  Surgeon: Renford Dills, MD;  Location: [facility] INVASIVE CV LAB;  Service: Cardiovascular;  Laterality: Right;   COLONOSCOPY     COLONOSCOPY WITH PROPOFOL N/A 01/25/2018   Procedure: COLONOSCOPY WITH PROPOFOL;  Surgeon: Midge Minium, MD;  Location: [facility] ENDOSCOPY;  Service: Endoscopy;  Laterality: N/A;   ESOPHAGOGASTRODUODENOSCOPY (EGD) WITH PROPOFOL N/A 01/24/2018   Procedure: ESOPHAGOGASTRODUODENOSCOPY (EGD) WITH PROPOFOL;  Surgeon: Midge Minium, MD;  Location: [facility] ENDOSCOPY;  Service: Endoscopy;  Laterality: N/A;   HIP ARTHROPLASTY Left 06/03/2019   Procedure: ARTHROPLASTY BIPOLAR HIP (HEMIARTHROPLASTY);  Surgeon: Donato Heinz, MD;  Location: [facility] ORS;  Service: Orthopedics;  Laterality: Left;   HIP ARTHROPLASTY Right 04/21/2020   Procedure: ARTHROPLASTY BIPOLAR HIP (HEMIARTHROPLASTY);  Surgeon: Christena Flake, MD;  Location: [facility] ORS;  Service: Orthopedics;  Laterality: Right;    Medical History: Past Medical History:  Diagnosis Date   Atrial fibrillation (HCC)    Bronchitis    Cancer (HCC)    Left leg growth, kidneys, lungs and breasts   Carcinoma of unknown primary (HCC)    COPD (chronic obstructive pulmonary disease) (HCC)    Diabetes mellitus without complication (HCC)    Hyperlipidemia    Hypertension    Pancreatitis    Pneumonia     Stroke (HCC)    TIA's   Vitamin B12 deficiency     Family History: Family History  Problem Relation Age of Onset   Cancer Father    Diabetes Brother     Social History   Socioeconomic History   Marital status: Divorced    Spouse name: Not on file   Number of children: Not on file   Years of education: Not on file   Highest education level: Not on file  Occupational History   Not on file  Tobacco Use   Smoking status: Former    Current packs/day: 0.00    Average packs/day: 0.5 packs/day for 15.0 years (7.5 ttl pk-yrs)    Types: Cigarettes    Start date: 11/15/1973    Quit date: 11/15/1988    Years since quitting: 34.8   Smokeless tobacco: Never  Vaping Use   Vaping status: Never Used  Substance and Sexual Activity   Alcohol use: No    Alcohol/week: 0.0 standard drinks of alcohol   Drug use: No   Sexual activity: Not Currently    Birth control/protection: Other-see comments    Comment: hysterectomy   Other Topics Concern   Not on file  Social History  Narrative   Not on file   Social Determinants of Health   Financial Resource Strain: Not on file  Food Insecurity: No Food Insecurity (08/19/2023)   Hunger Vital Sign    Worried About Running Out of Food in the Last Year: Never true    Ran Out of Food in the Last Year: Never true  Transportation Needs: No Transportation Needs (08/19/2023)   PRAPARE - Administrator, Civil Service (Medical): No    Lack of Transportation (Non-Medical): No  Physical Activity: Not on file  Stress: Not on file  Social Connections: Not on file  Intimate Partner Violence: Not At Risk (08/19/2023)   Humiliation, Afraid, Rape, and Kick questionnaire    Fear of Current or Ex-Partner: No    Emotionally Abused: No    Physically Abused: No    Sexually Abused: No     Assessment/Plan: 1. Other constipation Recommended prunes, prune juice, increasing fiber or trying an OTC medication such as colace or senokot  2. Essential  hypertension Continue medication as prescribed   3. Encounter for staple removal Procedure done without difficulty, see details in HPI  4. Recent unexplained weight loss Significant drop in weight from 142 lbs on 07/10/23 in ER to 125 lbs today. Patient will continue to monitor. She has had a decreased appetite but it is improving and will try to increase calorie intake.    General Counseling: Monica [name] verbalizes understanding of the findings of todays visit and agrees with plan of treatment. I have discussed any further diagnostic evaluation that may be needed or ordered today. We also reviewed her medications today. she has been encouraged to call the office with any questions or concerns that should arise related to todays visit.    No orders of the defined types were placed in this encounter.   No orders of the defined types were placed in this encounter.   Return if symptoms worsen or fail to improve, for keep schedule routine follow up.   Total time spent:30 Minutes Time spent includes review of chart, medications, test results, and follow up plan with the patient.   [place] Controlled Substance Database was reviewed by me.  This patient was seen by Sallyanne Kuster, FNP-C in collaboration with Dr. Beverely Risen as a part of collaborative care agreement.   Jaycelyn Orrison R. Tedd Sias, MSN, FNP-C Internal medicine

## 2023-07-22 ENCOUNTER — Telehealth: Payer: Self-pay | Admitting: Nurse Practitioner

## 2023-07-22 NOTE — Telephone Encounter (Signed)
Therapy order faxed to [facility]; [phone]. Scanned-Toni

## 2023-07-25 DIAGNOSIS — R2689 Other abnormalities of gait and mobility: Secondary | ICD-10-CM | POA: Diagnosis not present

## 2023-07-25 DIAGNOSIS — M6281 Muscle weakness (generalized): Secondary | ICD-10-CM | POA: Diagnosis not present

## 2023-07-25 DIAGNOSIS — R2681 Unsteadiness on feet: Secondary | ICD-10-CM | POA: Diagnosis not present

## 2023-07-25 DIAGNOSIS — R296 Repeated falls: Secondary | ICD-10-CM | POA: Diagnosis not present

## 2023-07-25 DIAGNOSIS — M159 Polyosteoarthritis, unspecified: Secondary | ICD-10-CM | POA: Diagnosis not present

## 2023-07-28 DIAGNOSIS — R600 Localized edema: Secondary | ICD-10-CM | POA: Diagnosis not present

## 2023-07-28 DIAGNOSIS — M159 Polyosteoarthritis, unspecified: Secondary | ICD-10-CM | POA: Diagnosis not present

## 2023-07-28 DIAGNOSIS — R296 Repeated falls: Secondary | ICD-10-CM | POA: Diagnosis not present

## 2023-07-28 DIAGNOSIS — E1165 Type 2 diabetes mellitus with hyperglycemia: Secondary | ICD-10-CM | POA: Diagnosis not present

## 2023-07-28 DIAGNOSIS — Z86718 Personal history of other venous thrombosis and embolism: Secondary | ICD-10-CM | POA: Diagnosis not present

## 2023-07-28 DIAGNOSIS — R2681 Unsteadiness on feet: Secondary | ICD-10-CM | POA: Diagnosis not present

## 2023-07-28 DIAGNOSIS — M6281 Muscle weakness (generalized): Secondary | ICD-10-CM | POA: Diagnosis not present

## 2023-07-28 DIAGNOSIS — J209 Acute bronchitis, unspecified: Secondary | ICD-10-CM | POA: Diagnosis not present

## 2023-07-28 DIAGNOSIS — R2689 Other abnormalities of gait and mobility: Secondary | ICD-10-CM | POA: Diagnosis not present

## 2023-07-28 DIAGNOSIS — Z9181 History of falling: Secondary | ICD-10-CM | POA: Diagnosis not present

## 2023-07-28 DIAGNOSIS — J440 Chronic obstructive pulmonary disease with acute lower respiratory infection: Secondary | ICD-10-CM | POA: Diagnosis not present

## 2023-07-28 DIAGNOSIS — R42 Dizziness and giddiness: Secondary | ICD-10-CM | POA: Diagnosis not present

## 2023-07-28 DIAGNOSIS — I480 Paroxysmal atrial fibrillation: Secondary | ICD-10-CM | POA: Diagnosis not present

## 2023-07-28 DIAGNOSIS — I10 Essential (primary) hypertension: Secondary | ICD-10-CM | POA: Diagnosis not present

## 2023-07-29 DIAGNOSIS — M159 Polyosteoarthritis, unspecified: Secondary | ICD-10-CM | POA: Diagnosis not present

## 2023-07-29 DIAGNOSIS — R2681 Unsteadiness on feet: Secondary | ICD-10-CM | POA: Diagnosis not present

## 2023-07-29 DIAGNOSIS — M6281 Muscle weakness (generalized): Secondary | ICD-10-CM | POA: Diagnosis not present

## 2023-07-29 DIAGNOSIS — R2689 Other abnormalities of gait and mobility: Secondary | ICD-10-CM | POA: Diagnosis not present

## 2023-07-29 DIAGNOSIS — R296 Repeated falls: Secondary | ICD-10-CM | POA: Diagnosis not present

## 2023-08-01 DIAGNOSIS — R2689 Other abnormalities of gait and mobility: Secondary | ICD-10-CM | POA: Diagnosis not present

## 2023-08-01 DIAGNOSIS — R2681 Unsteadiness on feet: Secondary | ICD-10-CM | POA: Diagnosis not present

## 2023-08-01 DIAGNOSIS — M6281 Muscle weakness (generalized): Secondary | ICD-10-CM | POA: Diagnosis not present

## 2023-08-01 DIAGNOSIS — M159 Polyosteoarthritis, unspecified: Secondary | ICD-10-CM | POA: Diagnosis not present

## 2023-08-01 DIAGNOSIS — R296 Repeated falls: Secondary | ICD-10-CM | POA: Diagnosis not present

## 2023-08-02 DIAGNOSIS — R2681 Unsteadiness on feet: Secondary | ICD-10-CM | POA: Diagnosis not present

## 2023-08-02 DIAGNOSIS — R2689 Other abnormalities of gait and mobility: Secondary | ICD-10-CM | POA: Diagnosis not present

## 2023-08-02 DIAGNOSIS — M159 Polyosteoarthritis, unspecified: Secondary | ICD-10-CM | POA: Diagnosis not present

## 2023-08-02 DIAGNOSIS — M6281 Muscle weakness (generalized): Secondary | ICD-10-CM | POA: Diagnosis not present

## 2023-08-02 DIAGNOSIS — R296 Repeated falls: Secondary | ICD-10-CM | POA: Diagnosis not present

## 2023-08-04 DIAGNOSIS — M159 Polyosteoarthritis, unspecified: Secondary | ICD-10-CM | POA: Diagnosis not present

## 2023-08-04 DIAGNOSIS — R296 Repeated falls: Secondary | ICD-10-CM | POA: Diagnosis not present

## 2023-08-04 DIAGNOSIS — R2681 Unsteadiness on feet: Secondary | ICD-10-CM | POA: Diagnosis not present

## 2023-08-04 DIAGNOSIS — M6281 Muscle weakness (generalized): Secondary | ICD-10-CM | POA: Diagnosis not present

## 2023-08-04 DIAGNOSIS — R2689 Other abnormalities of gait and mobility: Secondary | ICD-10-CM | POA: Diagnosis not present

## 2023-08-06 DIAGNOSIS — R2681 Unsteadiness on feet: Secondary | ICD-10-CM | POA: Diagnosis not present

## 2023-08-06 DIAGNOSIS — R296 Repeated falls: Secondary | ICD-10-CM | POA: Diagnosis not present

## 2023-08-06 DIAGNOSIS — M159 Polyosteoarthritis, unspecified: Secondary | ICD-10-CM | POA: Diagnosis not present

## 2023-08-06 DIAGNOSIS — R2689 Other abnormalities of gait and mobility: Secondary | ICD-10-CM | POA: Diagnosis not present

## 2023-08-06 DIAGNOSIS — M6281 Muscle weakness (generalized): Secondary | ICD-10-CM | POA: Diagnosis not present

## 2023-08-08 ENCOUNTER — Telehealth: Payer: Self-pay | Admitting: Nurse Practitioner

## 2023-08-08 NOTE — Telephone Encounter (Signed)
Received occupational therapy order from [facility]. Gave to AA for signature-nm

## 2023-08-09 DIAGNOSIS — M6281 Muscle weakness (generalized): Secondary | ICD-10-CM | POA: Diagnosis not present

## 2023-08-09 DIAGNOSIS — R296 Repeated falls: Secondary | ICD-10-CM | POA: Diagnosis not present

## 2023-08-09 DIAGNOSIS — M159 Polyosteoarthritis, unspecified: Secondary | ICD-10-CM | POA: Diagnosis not present

## 2023-08-09 DIAGNOSIS — R2681 Unsteadiness on feet: Secondary | ICD-10-CM | POA: Diagnosis not present

## 2023-08-09 DIAGNOSIS — R2689 Other abnormalities of gait and mobility: Secondary | ICD-10-CM | POA: Diagnosis not present

## 2023-08-10 ENCOUNTER — Telehealth: Payer: Self-pay | Admitting: Nurse Practitioner

## 2023-08-10 NOTE — Telephone Encounter (Signed)
OT eval & Plan of care faxed back to [facility]; [phone]. Scanned-Toni

## 2023-08-13 DIAGNOSIS — R296 Repeated falls: Secondary | ICD-10-CM | POA: Diagnosis not present

## 2023-08-13 DIAGNOSIS — R2681 Unsteadiness on feet: Secondary | ICD-10-CM | POA: Diagnosis not present

## 2023-08-13 DIAGNOSIS — M6281 Muscle weakness (generalized): Secondary | ICD-10-CM | POA: Diagnosis not present

## 2023-08-13 DIAGNOSIS — R2689 Other abnormalities of gait and mobility: Secondary | ICD-10-CM | POA: Diagnosis not present

## 2023-08-13 DIAGNOSIS — M159 Polyosteoarthritis, unspecified: Secondary | ICD-10-CM | POA: Diagnosis not present

## 2023-08-17 ENCOUNTER — Telehealth: Payer: Self-pay | Admitting: Nurse Practitioner

## 2023-08-17 NOTE — Telephone Encounter (Signed)
Received 08/06/23 PT plan of care from [facility]. Gave to [employer]

## 2023-08-18 ENCOUNTER — Telehealth: Payer: Self-pay | Admitting: Nurse Practitioner

## 2023-08-18 ENCOUNTER — Emergency Department: Payer: Medicare Other

## 2023-08-18 ENCOUNTER — Other Ambulatory Visit: Payer: Self-pay

## 2023-08-18 DIAGNOSIS — K8051 Calculus of bile duct without cholangitis or cholecystitis with obstruction: Secondary | ICD-10-CM | POA: Diagnosis not present

## 2023-08-18 DIAGNOSIS — Z809 Family history of malignant neoplasm, unspecified: Secondary | ICD-10-CM

## 2023-08-18 DIAGNOSIS — Z853 Personal history of malignant neoplasm of breast: Secondary | ICD-10-CM

## 2023-08-18 DIAGNOSIS — I10 Essential (primary) hypertension: Secondary | ICD-10-CM | POA: Diagnosis not present

## 2023-08-18 DIAGNOSIS — R627 Adult failure to thrive: Secondary | ICD-10-CM | POA: Diagnosis present

## 2023-08-18 DIAGNOSIS — Z66 Do not resuscitate: Secondary | ICD-10-CM | POA: Diagnosis present

## 2023-08-18 DIAGNOSIS — Z886 Allergy status to analgesic agent status: Secondary | ICD-10-CM

## 2023-08-18 DIAGNOSIS — Z515 Encounter for palliative care: Secondary | ICD-10-CM | POA: Diagnosis not present

## 2023-08-18 DIAGNOSIS — K828 Other specified diseases of gallbladder: Secondary | ICD-10-CM | POA: Diagnosis not present

## 2023-08-18 DIAGNOSIS — R932 Abnormal findings on diagnostic imaging of liver and biliary tract: Secondary | ICD-10-CM | POA: Diagnosis not present

## 2023-08-18 DIAGNOSIS — K838 Other specified diseases of biliary tract: Secondary | ICD-10-CM | POA: Diagnosis not present

## 2023-08-18 DIAGNOSIS — Z833 Family history of diabetes mellitus: Secondary | ICD-10-CM

## 2023-08-18 DIAGNOSIS — D500 Iron deficiency anemia secondary to blood loss (chronic): Secondary | ICD-10-CM | POA: Diagnosis present

## 2023-08-18 DIAGNOSIS — R079 Chest pain, unspecified: Secondary | ICD-10-CM | POA: Diagnosis not present

## 2023-08-18 DIAGNOSIS — I4891 Unspecified atrial fibrillation: Secondary | ICD-10-CM | POA: Diagnosis present

## 2023-08-18 DIAGNOSIS — Z7901 Long term (current) use of anticoagulants: Secondary | ICD-10-CM | POA: Diagnosis not present

## 2023-08-18 DIAGNOSIS — E872 Acidosis, unspecified: Secondary | ICD-10-CM | POA: Diagnosis present

## 2023-08-18 DIAGNOSIS — R456 Violent behavior: Secondary | ICD-10-CM | POA: Diagnosis not present

## 2023-08-18 DIAGNOSIS — K5730 Diverticulosis of large intestine without perforation or abscess without bleeding: Secondary | ICD-10-CM | POA: Diagnosis present

## 2023-08-18 DIAGNOSIS — R6521 Severe sepsis with septic shock: Secondary | ICD-10-CM | POA: Diagnosis present

## 2023-08-18 DIAGNOSIS — E119 Type 2 diabetes mellitus without complications: Secondary | ICD-10-CM | POA: Diagnosis present

## 2023-08-18 DIAGNOSIS — G2581 Restless legs syndrome: Secondary | ICD-10-CM | POA: Diagnosis present

## 2023-08-18 DIAGNOSIS — Z880 Allergy status to penicillin: Secondary | ICD-10-CM

## 2023-08-18 DIAGNOSIS — R531 Weakness: Secondary | ICD-10-CM | POA: Diagnosis not present

## 2023-08-18 DIAGNOSIS — N390 Urinary tract infection, site not specified: Secondary | ICD-10-CM | POA: Diagnosis present

## 2023-08-18 DIAGNOSIS — K760 Fatty (change of) liver, not elsewhere classified: Secondary | ICD-10-CM | POA: Diagnosis present

## 2023-08-18 DIAGNOSIS — Z881 Allergy status to other antibiotic agents status: Secondary | ICD-10-CM

## 2023-08-18 DIAGNOSIS — E7800 Pure hypercholesterolemia, unspecified: Secondary | ICD-10-CM | POA: Diagnosis present

## 2023-08-18 DIAGNOSIS — R918 Other nonspecific abnormal finding of lung field: Secondary | ICD-10-CM | POA: Diagnosis not present

## 2023-08-18 DIAGNOSIS — Z86718 Personal history of other venous thrombosis and embolism: Secondary | ICD-10-CM | POA: Diagnosis not present

## 2023-08-18 DIAGNOSIS — J449 Chronic obstructive pulmonary disease, unspecified: Secondary | ICD-10-CM | POA: Diagnosis present

## 2023-08-18 DIAGNOSIS — A419 Sepsis, unspecified organism: Secondary | ICD-10-CM | POA: Diagnosis not present

## 2023-08-18 DIAGNOSIS — Z882 Allergy status to sulfonamides status: Secondary | ICD-10-CM | POA: Diagnosis not present

## 2023-08-18 DIAGNOSIS — R1011 Right upper quadrant pain: Secondary | ICD-10-CM | POA: Diagnosis not present

## 2023-08-18 DIAGNOSIS — Z85118 Personal history of other malignant neoplasm of bronchus and lung: Secondary | ICD-10-CM

## 2023-08-18 DIAGNOSIS — R001 Bradycardia, unspecified: Secondary | ICD-10-CM | POA: Diagnosis not present

## 2023-08-18 DIAGNOSIS — Z6821 Body mass index (BMI) 21.0-21.9, adult: Secondary | ICD-10-CM | POA: Diagnosis not present

## 2023-08-18 DIAGNOSIS — H9190 Unspecified hearing loss, unspecified ear: Secondary | ICD-10-CM | POA: Diagnosis present

## 2023-08-18 DIAGNOSIS — Z85528 Personal history of other malignant neoplasm of kidney: Secondary | ICD-10-CM

## 2023-08-18 DIAGNOSIS — Z885 Allergy status to narcotic agent status: Secondary | ICD-10-CM

## 2023-08-18 DIAGNOSIS — Z7401 Bed confinement status: Secondary | ICD-10-CM | POA: Diagnosis not present

## 2023-08-18 DIAGNOSIS — K8050 Calculus of bile duct without cholangitis or cholecystitis without obstruction: Secondary | ICD-10-CM | POA: Diagnosis not present

## 2023-08-18 DIAGNOSIS — R110 Nausea: Secondary | ICD-10-CM | POA: Diagnosis not present

## 2023-08-18 DIAGNOSIS — Z87891 Personal history of nicotine dependence: Secondary | ICD-10-CM | POA: Diagnosis not present

## 2023-08-18 DIAGNOSIS — Z7984 Long term (current) use of oral hypoglycemic drugs: Secondary | ICD-10-CM | POA: Diagnosis not present

## 2023-08-18 DIAGNOSIS — A4151 Sepsis due to Escherichia coli [E. coli]: Secondary | ICD-10-CM | POA: Diagnosis present

## 2023-08-18 DIAGNOSIS — R0789 Other chest pain: Secondary | ICD-10-CM | POA: Diagnosis not present

## 2023-08-18 DIAGNOSIS — Z96643 Presence of artificial hip joint, bilateral: Secondary | ICD-10-CM | POA: Diagnosis present

## 2023-08-18 DIAGNOSIS — R1013 Epigastric pain: Secondary | ICD-10-CM | POA: Diagnosis not present

## 2023-08-18 DIAGNOSIS — Z85828 Personal history of other malignant neoplasm of skin: Secondary | ICD-10-CM

## 2023-08-18 DIAGNOSIS — R112 Nausea with vomiting, unspecified: Secondary | ICD-10-CM | POA: Diagnosis not present

## 2023-08-18 DIAGNOSIS — Z888 Allergy status to other drugs, medicaments and biological substances status: Secondary | ICD-10-CM

## 2023-08-18 DIAGNOSIS — Z79899 Other long term (current) drug therapy: Secondary | ICD-10-CM

## 2023-08-18 DIAGNOSIS — Z8673 Personal history of transient ischemic attack (TIA), and cerebral infarction without residual deficits: Secondary | ICD-10-CM

## 2023-08-18 LAB — CBC
HCT: 34.2 % — ABNORMAL LOW (ref 36.0–46.0)
Hemoglobin: 11 g/dL — ABNORMAL LOW (ref 12.0–15.0)
MCH: 25.8 pg — ABNORMAL LOW (ref 26.0–34.0)
MCHC: 32.2 g/dL (ref 30.0–36.0)
MCV: 80.1 fL (ref 80.0–100.0)
Platelets: 245 10*3/uL (ref 150–400)
RBC: 4.27 MIL/uL (ref 3.87–5.11)
RDW: 13.9 % (ref 11.5–15.5)
WBC: 11.1 10*3/uL — ABNORMAL HIGH (ref 4.0–10.5)
nRBC: 0 % (ref 0.0–0.2)

## 2023-08-18 LAB — BASIC METABOLIC PANEL
Anion gap: 9 (ref 5–15)
BUN: 22 mg/dL (ref 8–23)
CO2: 26 mmol/L (ref 22–32)
Calcium: 9.2 mg/dL (ref 8.9–10.3)
Chloride: 95 mmol/L — ABNORMAL LOW (ref 98–111)
Creatinine, Ser: 0.87 mg/dL (ref 0.44–1.00)
GFR, Estimated: >60 mL/min (ref >60)
Glucose, Bld: 181 mg/dL — ABNORMAL HIGH (ref 70–99)
Potassium: 3.7 mmol/L (ref 3.5–5.1)
Sodium: 130 mmol/L — ABNORMAL LOW (ref 135–145)

## 2023-08-18 LAB — HEPATIC FUNCTION PANEL
ALT: 62 U/L — ABNORMAL HIGH (ref 0–44)
AST: 110 U/L — ABNORMAL HIGH (ref 15–41)
Albumin: 3.8 g/dL (ref 3.5–5.0)
Alkaline Phosphatase: 66 U/L (ref 38–126)
Bilirubin, Direct: 0.5 mg/dL — ABNORMAL HIGH (ref 0.0–0.2)
Indirect Bilirubin: 0.6 mg/dL (ref 0.3–0.9)
Total Bilirubin: 1.1 mg/dL (ref 0.3–1.2)
Total Protein: 7.1 g/dL (ref 6.5–8.1)

## 2023-08-18 LAB — TROPONIN I (HIGH SENSITIVITY): Troponin I (High Sensitivity): 10 ng/L (ref <18)

## 2023-08-18 LAB — LIPASE, BLOOD: Lipase: 68 U/L — ABNORMAL HIGH (ref 11–51)

## 2023-08-18 MED ORDER — ONDANSETRON HCL 4 MG/2ML IJ SOLN
4.0000 mg | Freq: Once | INTRAMUSCULAR | Status: AC | PRN
  Administered 2023-08-18: 4 mg via INTRAVENOUS
  Filled 2023-08-18: qty 2

## 2023-08-18 NOTE — ED Triage Notes (Signed)
Pt presents via EMS from [facility] c/o epigastric pain since 1700 while eating dinner.

## 2023-08-18 NOTE — ED Notes (Signed)
Patient transported to X-ray 

## 2023-08-18 NOTE — ED Triage Notes (Signed)
Pt also reports nausea

## 2023-08-18 NOTE — Telephone Encounter (Addendum)
OT plan of care order signed. Faxed back to [facility]; [phone]. Scanned-Toni

## 2023-08-19 ENCOUNTER — Emergency Department: Payer: Medicare Other

## 2023-08-19 ENCOUNTER — Encounter: Payer: Self-pay | Admitting: Internal Medicine

## 2023-08-19 ENCOUNTER — Inpatient Hospital Stay
Admission: EM | Admit: 2023-08-19 | Discharge: 2023-08-22 | DRG: 951 | Disposition: A | Discharge status: Hospice | Payer: Medicare Other | Attending: Internal Medicine | Admitting: Internal Medicine

## 2023-08-19 DIAGNOSIS — K760 Fatty (change of) liver, not elsewhere classified: Secondary | ICD-10-CM | POA: Diagnosis present

## 2023-08-19 DIAGNOSIS — N390 Urinary tract infection, site not specified: Secondary | ICD-10-CM | POA: Diagnosis present

## 2023-08-19 DIAGNOSIS — K8050 Calculus of bile duct without cholangitis or cholecystitis without obstruction: Secondary | ICD-10-CM

## 2023-08-19 DIAGNOSIS — I4891 Unspecified atrial fibrillation: Secondary | ICD-10-CM | POA: Diagnosis present

## 2023-08-19 DIAGNOSIS — Z7901 Long term (current) use of anticoagulants: Secondary | ICD-10-CM | POA: Diagnosis not present

## 2023-08-19 DIAGNOSIS — K838 Other specified diseases of biliary tract: Secondary | ICD-10-CM | POA: Diagnosis not present

## 2023-08-19 DIAGNOSIS — Z515 Encounter for palliative care: Secondary | ICD-10-CM | POA: Diagnosis not present

## 2023-08-19 DIAGNOSIS — R6521 Severe sepsis with septic shock: Secondary | ICD-10-CM | POA: Diagnosis present

## 2023-08-19 DIAGNOSIS — D500 Iron deficiency anemia secondary to blood loss (chronic): Secondary | ICD-10-CM | POA: Diagnosis present

## 2023-08-19 DIAGNOSIS — E7800 Pure hypercholesterolemia, unspecified: Secondary | ICD-10-CM | POA: Diagnosis present

## 2023-08-19 DIAGNOSIS — Z882 Allergy status to sulfonamides status: Secondary | ICD-10-CM | POA: Diagnosis not present

## 2023-08-19 DIAGNOSIS — R456 Violent behavior: Secondary | ICD-10-CM | POA: Diagnosis not present

## 2023-08-19 DIAGNOSIS — Z7984 Long term (current) use of oral hypoglycemic drugs: Secondary | ICD-10-CM | POA: Diagnosis not present

## 2023-08-19 DIAGNOSIS — Z66 Do not resuscitate: Secondary | ICD-10-CM | POA: Diagnosis present

## 2023-08-19 DIAGNOSIS — I10 Essential (primary) hypertension: Secondary | ICD-10-CM | POA: Diagnosis present

## 2023-08-19 DIAGNOSIS — Z6821 Body mass index (BMI) 21.0-21.9, adult: Secondary | ICD-10-CM | POA: Diagnosis not present

## 2023-08-19 DIAGNOSIS — J449 Chronic obstructive pulmonary disease, unspecified: Secondary | ICD-10-CM | POA: Diagnosis present

## 2023-08-19 DIAGNOSIS — G2581 Restless legs syndrome: Secondary | ICD-10-CM | POA: Diagnosis present

## 2023-08-19 DIAGNOSIS — Z86718 Personal history of other venous thrombosis and embolism: Secondary | ICD-10-CM | POA: Diagnosis not present

## 2023-08-19 DIAGNOSIS — E119 Type 2 diabetes mellitus without complications: Secondary | ICD-10-CM | POA: Diagnosis present

## 2023-08-19 DIAGNOSIS — Z7401 Bed confinement status: Secondary | ICD-10-CM | POA: Diagnosis not present

## 2023-08-19 DIAGNOSIS — H9190 Unspecified hearing loss, unspecified ear: Secondary | ICD-10-CM | POA: Diagnosis present

## 2023-08-19 DIAGNOSIS — R1011 Right upper quadrant pain: Secondary | ICD-10-CM | POA: Diagnosis not present

## 2023-08-19 DIAGNOSIS — A4151 Sepsis due to Escherichia coli [E. coli]: Secondary | ICD-10-CM | POA: Diagnosis present

## 2023-08-19 DIAGNOSIS — E872 Acidosis, unspecified: Secondary | ICD-10-CM | POA: Diagnosis present

## 2023-08-19 DIAGNOSIS — Z7189 Other specified counseling: Secondary | ICD-10-CM

## 2023-08-19 DIAGNOSIS — Z87891 Personal history of nicotine dependence: Secondary | ICD-10-CM | POA: Diagnosis not present

## 2023-08-19 DIAGNOSIS — R932 Abnormal findings on diagnostic imaging of liver and biliary tract: Secondary | ICD-10-CM | POA: Diagnosis not present

## 2023-08-19 DIAGNOSIS — R509 Fever, unspecified: Secondary | ICD-10-CM

## 2023-08-19 DIAGNOSIS — S72001A Fracture of unspecified part of neck of right femur, initial encounter for closed fracture: Secondary | ICD-10-CM | POA: Diagnosis present

## 2023-08-19 DIAGNOSIS — K828 Other specified diseases of gallbladder: Secondary | ICD-10-CM | POA: Diagnosis not present

## 2023-08-19 DIAGNOSIS — A419 Sepsis, unspecified organism: Secondary | ICD-10-CM

## 2023-08-19 DIAGNOSIS — R627 Adult failure to thrive: Secondary | ICD-10-CM | POA: Diagnosis present

## 2023-08-19 DIAGNOSIS — K8051 Calculus of bile duct without cholangitis or cholecystitis with obstruction: Secondary | ICD-10-CM | POA: Diagnosis present

## 2023-08-19 DIAGNOSIS — K5730 Diverticulosis of large intestine without perforation or abscess without bleeding: Secondary | ICD-10-CM | POA: Diagnosis present

## 2023-08-19 DIAGNOSIS — R531 Weakness: Secondary | ICD-10-CM | POA: Diagnosis not present

## 2023-08-19 DIAGNOSIS — R110 Nausea: Secondary | ICD-10-CM

## 2023-08-19 LAB — TROPONIN I (HIGH SENSITIVITY): Troponin I (High Sensitivity): 7 ng/L (ref <18)

## 2023-08-19 LAB — URINALYSIS, ROUTINE W REFLEX MICROSCOPIC
Bilirubin Urine: NEGATIVE
Glucose, UA: NEGATIVE mg/dL
Hgb urine dipstick: NEGATIVE
Ketones, ur: NEGATIVE mg/dL
Nitrite: POSITIVE — AB
Protein, ur: NEGATIVE mg/dL
Specific Gravity, Urine: 1.013 (ref 1.005–1.030)
pH: 5 (ref 5.0–8.0)

## 2023-08-19 LAB — PROCALCITONIN: Procalcitonin: <0.1 ng/mL

## 2023-08-19 LAB — LACTIC ACID, PLASMA
Lactic Acid, Venous: 2.3 mmol/L (ref 0.5–1.9)
Lactic Acid, Venous: 1.3 mmol/L (ref 0.5–1.9)

## 2023-08-19 MED ORDER — ONDANSETRON HCL 4 MG/2ML IJ SOLN: 4.0000 mg | Freq: Four times a day (QID) | INTRAMUSCULAR | Status: DC | PRN

## 2023-08-19 MED ORDER — MIDAZOLAM HCL 2 MG/2ML IJ SOLN: 2.0000 mg | INTRAMUSCULAR | Status: DC | PRN

## 2023-08-19 MED ORDER — ACETAMINOPHEN 325 MG RE SUPP: 975.0000 mg | Freq: Once | RECTAL | Status: DC

## 2023-08-19 MED ORDER — GADOBUTROL 1 MMOL/ML IV SOLN
6.0000 mL | Freq: Once | INTRAVENOUS | Status: AC | PRN
  Administered 2023-08-19: 6 mL via INTRAVENOUS

## 2023-08-19 MED ORDER — KETOROLAC TROMETHAMINE 15 MG/ML IJ SOLN
15.0000 mg | Freq: Once | INTRAMUSCULAR | Status: AC
  Administered 2023-08-19: 15 mg via INTRAVENOUS
  Filled 2023-08-19: qty 1

## 2023-08-19 MED ORDER — SODIUM CHLORIDE 0.9 % IV SOLN
1.0000 g | Freq: Once | INTRAVENOUS | Status: AC
  Administered 2023-08-19: 1 g via INTRAVENOUS
  Filled 2023-08-19: qty 10

## 2023-08-19 MED ORDER — NOREPINEPHRINE 4 MG/250ML-% IV SOLN
INTRAVENOUS | Status: AC
  Filled 2023-08-19: qty 250

## 2023-08-19 MED ORDER — NOREPINEPHRINE 4 MG/250ML-% IV SOLN
0.0000 ug/min | INTRAVENOUS | Status: DC
  Administered 2023-08-19: 2 ug/min via INTRAVENOUS

## 2023-08-19 MED ORDER — HALOPERIDOL LACTATE 5 MG/ML IJ SOLN
2.0000 mg | Freq: Once | INTRAMUSCULAR | Status: AC
  Administered 2023-08-19: 2 mg via INTRAVENOUS
  Filled 2023-08-19: qty 1

## 2023-08-19 MED ORDER — HALOPERIDOL LACTATE 5 MG/ML IJ SOLN: 2.5000 mg | INTRAMUSCULAR | Status: DC | PRN

## 2023-08-19 MED ORDER — POLYVINYL ALCOHOL 1.4 % OP SOLN: 1.0000 [drp] | Freq: Four times a day (QID) | OPHTHALMIC | Status: DC | PRN

## 2023-08-19 MED ORDER — SODIUM CHLORIDE 0.9 % IV SOLN
2.0000 g | Freq: Once | INTRAVENOUS | Status: AC
  Administered 2023-08-19: 2 g via INTRAVENOUS
  Filled 2023-08-19: qty 10

## 2023-08-19 MED ORDER — SODIUM CHLORIDE 0.9 % IV BOLUS
500.0000 mL | Freq: Once | INTRAVENOUS | Status: AC
  Administered 2023-08-19: 500 mL via INTRAVENOUS

## 2023-08-19 MED ORDER — SODIUM CHLORIDE 0.9 % IV BOLUS (SEPSIS)
250.0000 mL | Freq: Once | INTRAVENOUS | Status: AC
  Administered 2023-08-19: 250 mL via INTRAVENOUS

## 2023-08-19 MED ORDER — SODIUM CHLORIDE 0.9 % IV SOLN: INTRAVENOUS | Status: DC

## 2023-08-19 MED ORDER — DIPHENHYDRAMINE HCL 50 MG/ML IJ SOLN: 25.0000 mg | INTRAMUSCULAR | Status: DC | PRN

## 2023-08-19 MED ORDER — GLYCOPYRROLATE 0.2 MG/ML IJ SOLN: 0.2000 mg | INTRAMUSCULAR | Status: DC | PRN

## 2023-08-19 MED ORDER — KETOROLAC TROMETHAMINE 15 MG/ML IJ SOLN
15.0000 mg | Freq: Four times a day (QID) | INTRAMUSCULAR | Status: AC | PRN
  Administered 2023-08-20: 15 mg via INTRAVENOUS
  Filled 2023-08-19: qty 1

## 2023-08-19 MED ORDER — SODIUM CHLORIDE 0.9 % IV BOLUS (SEPSIS): 1000.0000 mL | Freq: Once | INTRAVENOUS | Status: DC

## 2023-08-19 MED ORDER — FENTANYL CITRATE PF 50 MCG/ML IJ SOSY
25.0000 ug | PREFILLED_SYRINGE | Freq: Once | INTRAMUSCULAR | Status: AC
  Administered 2023-08-19: 25 ug via INTRAVENOUS
  Filled 2023-08-19: qty 1

## 2023-08-19 MED ORDER — MORPHINE SULFATE (PF) 2 MG/ML IV SOLN
2.0000 mg | INTRAVENOUS | Status: DC | PRN
  Administered 2023-08-20: 2 mg via INTRAVENOUS
  Filled 2023-08-19: qty 1

## 2023-08-19 MED ORDER — ONDANSETRON 4 MG PO TBDP: 4.0000 mg | ORAL_TABLET | Freq: Four times a day (QID) | ORAL | Status: DC | PRN

## 2023-08-19 MED ORDER — ONDANSETRON HCL 4 MG/2ML IJ SOLN
4.0000 mg | Freq: Once | INTRAMUSCULAR | Status: AC
  Administered 2023-08-19: 4 mg via INTRAVENOUS
  Filled 2023-08-19: qty 2

## 2023-08-19 MED ORDER — LORAZEPAM 2 MG/ML IJ SOLN
0.5000 mg | Freq: Once | INTRAMUSCULAR | Status: AC
  Administered 2023-08-19: 0.5 mg via INTRAVENOUS
  Filled 2023-08-19: qty 1

## 2023-08-19 MED ORDER — SODIUM CHLORIDE 0.9 % IV BOLUS
1000.0000 mL | Freq: Once | INTRAVENOUS | Status: AC
  Administered 2023-08-19: 1000 mL via INTRAVENOUS

## 2023-08-19 MED ORDER — METRONIDAZOLE 500 MG/100ML IV SOLN
500.0000 mg | Freq: Once | INTRAVENOUS | Status: AC
  Administered 2023-08-19: 500 mg via INTRAVENOUS
  Filled 2023-08-19: qty 100

## 2023-08-19 MED ORDER — SODIUM CHLORIDE 0.9 % IV BOLUS (SEPSIS)
500.0000 mL | Freq: Once | INTRAVENOUS | Status: AC
  Administered 2023-08-19: 500 mL via INTRAVENOUS

## 2023-08-19 MED ORDER — GLYCOPYRROLATE 1 MG PO TABS: 1.0000 mg | ORAL_TABLET | ORAL | Status: DC | PRN

## 2023-08-19 NOTE — ED Notes (Signed)
Called [facility] back for an update, spoke with rep. Sraddha. explained to the rep per Dr. Anner Crete the pt. is clinically worsening and rep. stated she need to speak to the Dr. transferred call.

## 2023-08-19 NOTE — Assessment & Plan Note (Addendum)
See H&P for details. Comfort care measures only.

## 2023-08-19 NOTE — ED Notes (Signed)
Received a call from Rep: Alex at [facility] to inform Dr.Wells that after speaking to Administrative team patient is being declined due to capacity

## 2023-08-19 NOTE — ED Notes (Signed)
Pt pulled up in bed and repositioned. MD at bedside updating family on plan of care.

## 2023-08-19 NOTE — Progress Notes (Signed)
CODE SEPSIS - PHARMACY COMMUNICATION  **Broad Spectrum Antibiotics should be administered within 1 hour of Sepsis diagnosis**  Time Code Sepsis Called/Page Received: 10/4 @ 0702  Antibiotics Ordered: Aztreonam, metronidazole  Time of 1st antibiotic administration: 0717  Additional action taken by pharmacy: N/A  If necessary, Name of Provider/Nurse Contacted: N/A    Merryl Hacker ,PharmD Clinical Pharmacist  08/19/2023  7:10 AM

## 2023-08-19 NOTE — ED Provider Notes (Signed)
Physical Exam  BP (!) 135/55   Pulse (!) 110   Temp (!) 101.4 F (38.6 C)   Resp 16   Ht 5\' 4"  (1.626 m)   Wt 57 kg   SpO2 93%   BMI 21.57 kg/m   Physical Exam  Procedures  .Critical Care  Performed by: Janith Lima, MD Authorized by: Janith Lima, MD   Critical care provider statement:    Critical care time (minutes):  80   Critical care was necessary to treat or prevent imminent or life-threatening deterioration of the following conditions:  Sepsis and circulatory failure   Critical care was time spent personally by me on the following activities:  Development of treatment plan with patient or surrogate, discussions with consultants, evaluation of patient's response to treatment, examination of patient, ordering and review of laboratory studies, ordering and review of radiographic studies, ordering and performing treatments and interventions, pulse oximetry, re-evaluation of patient's condition and review of old charts   Care discussed with: admitting provider     ED Course / MDM   Clinical Course as of 08/19/23 1314  Fri Aug 19, 2023  0241 Updated patient and her daughters on nitrite and leukocyte positive UTI, negative repeat troponin, ultrasound demonstrating polyps and dilated CBD.  Will obtain MRCP; patient will require low-dose IV calming agent. [JS]  0410 Patient returned from MRI, agitated.  Will administer low-dose IV Haldol. [JS]  0507 Patient resting, awaiting results of MRCP. [JS]  0621 There was some confusion regarding patient's MRCP.  Daughters thought she was unable to have it done secondary to agitation.  I am told she was able to have some images for a limited study and we are awaiting radiology interpretation.  Daughters mention patient can tolerate CT scan so we will proceed with CT abdomen/pelvis while awaiting MRCP results which will likely be indeterminant. [JS]  [phone] Care will be transferred to the oncoming provider at change of shift pending CT scan,  MRCP interpretation, disposition either admit to this facility versus transfer. [JS]  0654 MRCP positive for choledocholithiasis.  Will discuss with GI on-call.  Have canceled CT scan. [JS]  0705 Patient now febrile, will obtain blood cultures, lactic acid, procalcitonin.  ED code sepsis protocol initiated; additional broad-spectrum IV antibiotics added. [JS]  4782 Discussed case with Dr. Timothy Lasso from GI; unfortunately Dr. Servando Snare is unavailable today for ERCP so patient will need transfer.  Daughter tells me patient is allergic to acetaminophen, describes swelling in addition to itching.  Will consult pharmacy for antipyretic recommendations as patient is also allergic to Celebrex (nausea/vomiting).  Care transferred to Dr. Anner Crete who will contact [facility] transfer center. [JS]  9562 Discussed the case with GI team at [facility].  They do not have availability for ERCP until likely next Monday and recommended her that transfer to another facility for sooner evaluation and management. [DW]  1308 Current BP is 116/64 with MAP 81. HR 88 [DW]  0911 [facility] at capacity and cannot accept transfer.  Will call [facility] at this time. [DW]  [phone] Called to bedside due to patient's blood pressure dropping.  76/29 with MAP of 45.  Heart rate stable in the 70s.  Will plan for initiation of norepinephrine as well as 500 cc bolus as patient has already received 30 cc/kg of fluids. [DW]  [phone] Discussed CODE STATUS with daughters at bedside.  They state that patient would not want CPR or intubation if required.  They are agreeable with antibiotics and pressors. [  DW]  1004 BP(!): 108/43 [DW]  1136 Patient's blood pressure stabilized on 7 of Levophed with MAP at 78.  Will continue to titrate down reading a MAP goal greater than 65.  Discussed with family that [facility] and [facility] are full and we are trying [facility] at this time.  They did bring up patient's worsening condition and discussed whether they might prefer palliative care route at this  time.  I do not think this is unreasonable given her age and active sepsis from the choledocho with altered mental status.  Will discuss with GI given likely prognosis to give family updated information and rediscuss shortly. [DW]  1146 Discussed antibiotic dosing with pharmacist given delay in transfer.  Ceftriaxone will be every 24 hours and not needed at this time.  Metronidazole not needed again until 9 PM this evening.  Aztreonam every 8 hours and not needed again until 4 PM. [DW]  1203 Patient continues to have altered mental status.  Unable to get her admitted to [facility] [place], or [facility].  Discussed this with family who states that patient would not want aggressive treatments nor would never want to end up in an assisted living facility which they believe she will likely end up and afterwards.  Given sepsis picture and her age, they do not think she would tolerate the procedures well and at this time wish to proceed with palliative care.  I do think this is reasonable given her age, comorbidities, and current medical condition.  Will plan to admit to hospitalist with palliative evaluation. [DW]    Clinical Course User Index [DW] Janith Lima, MD [JS] Irean Hong, MD   Medical Decision Making Amount and/or Complexity of Data Reviewed Labs: ordered. Radiology: ordered.  Risk OTC drugs. Prescription drug management. Decision regarding hospitalization.   89 year old female handed off in signout for epigastric pain associated with nausea.  MRCP was obtained by previous provider showing evidence of choledocholithiasis.  On-call GI doctor was consulted and recommends transfer to [facility] for ERCP.  Around this time, patient was positive for fever along with continued tachycardia meeting sepsis criteria.  She was started on broad-spectrum antibiotics.  Please see ED course for timeline of events.  Ultimately, patient became more septic requiring initiation of norepinephrine which was  titrated upwards to maintain maps greater than 65.  Continued altered mental status was present.  Was unable to get patient transferred to [facility], [facility], or [facility].  Family called me back into room with worsening status in the setting of choledocholithiasis with sepsis and inability to get patient transferred for definitive treatment.  They stated that patient would not want additional measures given worsening prognosis.  They elected for transition to comfort care measures at this time.  Patient was admitted to hospitalist service for ongoing care discussion with palliative for comfort care approach.       Janith Lima, MD 08/19/23 [phone]

## 2023-08-19 NOTE — H&P (Addendum)
History and Physical   Monica [name] ZHY:[id number] DOB: 01/19/1926 DOA: 08/19/2023  PCP: Sallyanne Kuster, NP  Patient coming from: [facility], via EMS  I have personally briefly reviewed patient's old medical records in [facility] EMR.  Chief Concern: Abdominal pain  HPI: Monica [name] is a 89 year old female with history of tobacco use, quit more than 20 years ago, hypertension, history of DVT on Eliquis, non-insulin-dependent diabetes mellitus, hyperlipidemia, who presents to the emergency department late evening of 08/18/2023, from [facility] for chief concerns of epigastric pain while eating dinner.  Initial vitals in the ED showed temperature of 97.9, respiration rate of 16, heart rate of 52, blood pressure 182/56, SpO2 of 96% on room air.  Throughout ED course, patient developed severe sepsis with septic shock, Tmax of 101.4, blood pressure decreasing to 95/38, MAP of 56.  Patient was initially started on vasopressor, Levophed.  Right upper quadrant abdominal ultrasound said 3 mm gallbladder polyp per consensus statement, gallbladder polyps less than 6 mm usually benign not requiring follow-up.  Dilated common bile duct to 12 mm.  MRCP was ordered and read as positive for obstructive choledocholithiasis, 8 mm stone in the distal common bile duct at the level of the ampulla with severe common bile duct dilatation and moderate intrahepatic biliary ductal dilatation.  Gallbladder severely distended with some gallbladder wall edema.  Findings are favored to reflect distal biliary tract obstruction rather than acute cholecystitis.  Hepatic steatosis.  Colonic diverticulosis.  ED treatments include Haldol 2 mg IV, fentanyl 25 mcg, Toradol 15 mg, Ativan, ondansetron 4 mg IV, ceftriaxone 1 g IV, aztreonam, Flagyl, sodium chloride bolus totaling 2.25 L.  Levophed gtt.  ERCP was not available at [facility].  [facility], [facility] were called and declined patient due to no bed availability.   [facility] was called for transfer initiation.  Ultimately family decided on comfort measures with palliative care admission.  Transfer process was discontinued by ED. -------------------------- At bedside, patient was able to tell me her first and last name, stated that she was 89 years old and states the year is 19 multiple times.  She states that she was told she is in the hospital however she feels that the hospital is very messy right now.  She states that she feels happy that her family is here.  Her speech was difficult to understand and garbled.  She was lucid with intermittent confusion.  Social history: Patient is at [facility].  She is a former tobacco user, quitting more than 20 years ago.  Family denies EtOH and recreational drug use.  She is retired and formally worked in [employer].  ROS: Unable to complete due to intermittent confusion and garbled speech and patient acuity of presentation  ED Course: Discussed with emergency medicine provider, patient requiring hospitalization for chief concerns of end-of-life care, comfort measures only.  Assessment/Plan  Principal Problem:   End of life care Active Problems:   Restless legs syndrome   Pure hypercholesterolemia   H/O deep venous thrombosis   Essential hypertension   Iron deficiency anemia due to chronic blood loss   Chronic obstructive pulmonary disease (HCC)   Fracture of femoral neck, right (HCC)   Diabetes mellitus without complication (HCC)   Choledocholithiasis   Severe sepsis with acute organ dysfunction (HCC)   Septic shock (HCC)   History of tobacco use   Assessment and Plan:  * End of life care Patient presenting with apparent failure to thrive with possible  sepsis, etiology was not worked up due to patient's family requesting comfort After discussion in the emergency medicine provider, family has decided to proceed with comfort care only Comfort care order set utilized No antibiotics or IVF as  per family's request As needed medications include pain control with morphine as needed, would transition to a drip if needed but patient does not appear to be in pain at this time RN can pronounce death Palliative care consultation was offered, patient declined stating that they have reached a decision for inpatient comfort care.  History of tobacco use Per family, patient quit more than 20 years ago  Septic shock (HCC) Did not read want aggressive IV fluid, required vasopressor, Levophed  Severe sepsis with acute organ dysfunction (HCC) Blood cultures x 2 are in process, urine culture in process Patient met severe sepsis criteria with elevated lactic acid initially at 2.3, organ involvement, liver in setting of elevated direct bilirubin, with systemic involvement in setting of hypotension requiring vasopressor (Levophed) initially Antibiotics include ceftriaxone 1 g IV, metronidazole 500 mg IV, aztreonam 2 g IV ED did discuss patient case with [facility], [facility] who all did not have beds available for the patient Monica [name] was also contacted, family ultimately decided on comfort measures only Levophed was discontinued, no more antibiotic per family request  Chart reviewed.   DVT prophylaxis: None - comfort measures Code Status: DNR - confirmed with family Family Communication: Three daughters Monica [name], Monica [name], Monica [name]) and granddaughters Monica [name] were at bedside and we had extensive discussion and time allotted to answer family questions Disposition Plan: Anticipate in-hospital death Consults called:  none at this time Admission status: Admit - It is my clinical opinion that admission to INPATIENT is reasonable and necessary because of the expectation that this patient will require hospital care that crosses at least 2 midnights to treat this condition based on the medical complexity of the problems presented.  Given the aforementioned information, the predictability of  an adverse outcome is felt to be significant.  Past Medical History:  Diagnosis Date   Atrial fibrillation (HCC)    Bronchitis    Cancer (HCC)    Left leg growth, kidneys, lungs and breasts   Carcinoma of unknown primary (HCC)    COPD (chronic obstructive pulmonary disease) (HCC)    Diabetes mellitus without complication (HCC)    Hyperlipidemia    Hypertension    Pancreatitis    Pneumonia    Stroke (HCC)    TIA's   Vitamin B12 deficiency    Past Surgical History:  Procedure Laterality Date   ABDOMINAL HYSTERECTOMY  1975   BLADDER SUSPENSION  1989   CAROTID ANGIOGRAPHY Right 11/23/2019   Procedure: CAROTID ANGIOGRAPHY;  Surgeon: Renford Dills, MD;  Location: [facility] INVASIVE CV LAB;  Service: Cardiovascular;  Laterality: Right;   COLONOSCOPY     COLONOSCOPY WITH PROPOFOL N/A 01/25/2018   Procedure: COLONOSCOPY WITH PROPOFOL;  Surgeon: Midge Minium, MD;  Location: [facility] ENDOSCOPY;  Service: Endoscopy;  Laterality: N/A;   ESOPHAGOGASTRODUODENOSCOPY (EGD) WITH PROPOFOL N/A 01/24/2018   Procedure: ESOPHAGOGASTRODUODENOSCOPY (EGD) WITH PROPOFOL;  Surgeon: Midge Minium, MD;  Location: [facility] ENDOSCOPY;  Service: Endoscopy;  Laterality: N/A;   HIP ARTHROPLASTY Left 06/03/2019   Procedure: ARTHROPLASTY BIPOLAR HIP (HEMIARTHROPLASTY);  Surgeon: Donato Heinz, MD;  Location: [facility] ORS;  Service: Orthopedics;  Laterality: Left;   HIP ARTHROPLASTY Right 04/21/2020   Procedure: ARTHROPLASTY BIPOLAR HIP (HEMIARTHROPLASTY);  Surgeon: Christena Flake, MD;  Location: [facility] ORS;  Service: Orthopedics;  Laterality: Right;   Social History:  reports that she quit smoking about 34 years ago. Her smoking use included cigarettes. She started smoking about 49 years ago. She has a 7.5 pack-year smoking history. She has never used smokeless tobacco. She reports that she does not drink alcohol and does not use drugs.  Allergies  Allergen Reactions   Sulfa Antibiotics Swelling   Celecoxib Nausea And Vomiting    Acetaminophen Itching   Codeine Rash   Lyrica [Pregabalin] Rash   Penicillin G Rash    Has patient had a PCN reaction causing immediate rash, facial/tongue/throat swelling, SOB or lightheadedness with hypotension: Yes Has patient had a PCN reaction causing severe rash involving mucus membranes or skin necrosis: No Has patient had a PCN reaction that required hospitalization: No Has patient had a PCN reaction occurring within the last 10 years: Unknown If all of the above answers are "NO", then may proceed with Cephalosporin use.   Petrolatum-Zinc Oxide Rash   Family History  Problem Relation Age of Onset   Cancer Father    Diabetes Brother    Family history: Family history reviewed and not pertinent.  Prior to Admission medications   Medication Sig Start Date End Date Taking? Authorizing Provider  albuterol (VENTOLIN HFA) 108 (90 Base) MCG/ACT inhaler INHALE TWO PUFFS INTO THE LUNGS EVERY 4 HOURS AS NEEDED FOR WHEEZING OR SHORTNESS OF BREATH 03/22/23   Abernathy, Arlyss Repress, NP  amLODipine-benazepril (LOTREL) 10-20 MG capsule Take 1 capsule by mouth daily. 04/05/23   Sallyanne Kuster, NP  apixaban (ELIQUIS) 2.5 MG TABS tablet Take 1 tablet (2.5 mg total) by mouth 2 (two) times daily. 04/05/23   Sallyanne Kuster, NP  clotrimazole-betamethasone (LOTRISONE) cream Apply 1 Application topically 2 (two) times daily. 10/19/22   Sallyanne Kuster, NP  feeding supplement (BOOST HIGH PROTEIN) LIQD Take 1 Container by mouth as needed.    [provider]  gabapentin (NEURONTIN) 300 MG capsule Take 2 capsules (600 mg total) by mouth at bedtime. 12/01/22   Sallyanne Kuster, NP  glucose blood (ONETOUCH ULTRA) test strip 1 each by Other route daily. Use as instructed 07/26/19   Carlean Jews, NP  lubiprostone (AMITIZA) 8 MCG capsule Take 1 capsule (8 mcg total) by mouth 2 (two) times daily with a meal. 05/23/23   Abernathy, Arlyss Repress, NP  meloxicam (MOBIC) 7.5 MG tablet Take 1 tablet (7.5 mg total) by  mouth daily. 01/20/23   Sallyanne Kuster, NP  metFORMIN (GLUCOPHAGE-XR) 500 MG 24 hr tablet TAKE ONE (1) TABLET BY MOUTH TWO TIMES PER DAY WITH A MEAL 04/20/23   Abernathy, Alyssa, NP  montelukast (SINGULAIR) 10 MG tablet TAKE ONE TABLET (10 MG) BY MOUTH AT BEDTIME 01/20/23   Sallyanne Kuster, NP  mupirocin ointment (BACTROBAN) 2 % Apply 1 Application topically daily. To wound on buttocks until healed. 01/20/23   Sallyanne Kuster, NP  NYSTATIN powder APPLY 1 APPLICATION TOPICALLY 3 TIMES DAILY TO GROIN AREA UNTIL RASH RESOLVES 06/28/23   Sallyanne Kuster, NP  OneTouch Delica Lancets 30G MISC Use as directed once daily as needed 09/11/20   Carlean Jews, NP  oxybutynin (DITROPAN-XL) 10 MG 24 hr tablet TAKE ONE TABLET BY MOUTH EVERY DAY 05/23/23   Sallyanne Kuster, NP  THEO-24 100 MG 24 hr capsule Take 1 capsule (100 mg total) by mouth daily. 12/01/22   Sallyanne Kuster, NP  famotidine (PEPCID) 20 MG tablet Take 1 tablet (20 mg total) by mouth 2 (two) times daily. 08/11/16 11/28/19  Katha Hamming, MD  pravastatin (PRAVACHOL) 40 MG tablet Take 1 tablet (40 mg total) by mouth daily. 08/15/19 11/28/19  Carlean Jews, NP   Physical Exam: Vitals:   08/19/23 1030 08/19/23 1040 08/19/23 1041 08/19/23 1045  BP: (!) 145/55 (!) 150/60  (!) 142/51  Pulse: 86 92  79  Resp: (!) 23 20  (!) 24  Temp:   98 F (36.7 C)   TempSrc:   Axillary   SpO2: 100% 100%  100%  Weight:      Height:       Constitutional: appears frail, cachectic, acutely ill Eyes: PERRL, lids and conjunctivae normal ENMT: Mucous membranes are dry. Posterior pharynx clear of any exudate or lesions. Age-appropriate dentition.  Difficult to assess hearing Neck: normal, supple, no masses, no thyromegaly Respiratory: clear to auscultation bilaterally, no wheezing, no crackles. Normal respiratory effort. No accessory muscle use.  Cardiovascular: Regular rate and rhythm, no murmurs / rubs / gallops. No extremity edema. 2+ pedal pulses. No  carotid bruits.  Abdomen: no tenderness, no masses palpated, no hepatosplenomegaly. Bowel sounds positive.  Musculoskeletal: no clubbing / cyanosis. No joint deformity upper and lower extremities. Good ROM, no contractures, no atrophy. Normal muscle tone.  Skin: no rashes, lesions, ulcers. No induration Neurologic: Sensation intact. Strength 5/5 in all 4.  Psychiatric: Normal judgment and insight. Alert and oriented x 3. Normal mood.   EKG: independently reviewed, showing sinus rhythm with rate of 62, QTc 442  Chest x-ray on Admission: I personally reviewed and I agree with radiologist reading as below.  MR ABDOMEN MRCP WO CONTRAST  Result Date: 08/19/2023 CLINICAL DATA:  89 year old female with history of right upper quadrant abdominal pain. Dilated common bile duct on ultrasound examination. Evaluate for biliary tract obstruction. EXAM: MRI ABDOMEN WITHOUT CONTRAST  (INCLUDING MRCP) TECHNIQUE: Multiplanar multisequence MR imaging of the abdomen was performed. Heavily T2-weighted images of the biliary and pancreatic ducts were obtained, and three-dimensional MRCP images were rendered by post processing. COMPARISON:  None Available. FINDINGS: Comment: Today's study is limited for detection and characterization of visceral and/or vascular lesions by lack of IV gadolinium. Lower chest: Unremarkable. Hepatobiliary: No definite suspicious cystic or solid hepatic lesions are confidently identified on today's noncontrast examination. Diffuse loss of signal intensity throughout the hepatic parenchyma on out of phase dual echo images, indicative of a background of hepatic steatosis. Gallbladder is severely distended. No definite filling defects are noted within the lumen of the gallbladder to suggest gallstones. Gallbladder wall appears mildly edematous. Moderate intrahepatic biliary ductal dilatation. Severe dilatation of the common bile duct measuring up to 15 mm in the distal common bile duct. 8 mm filling  defect at the level of the ampulla, indicative of choledocholithiasis (coronal image 26 of series 3). Pancreas: No definite pancreatic mass or peripancreatic fluid collections or inflammatory changes noted on today's noncontrast examination. No pancreatic ductal dilatation. Spleen:  Unremarkable. Adrenals/Urinary Tract: Right kidney and bilateral adrenal glands are unremarkable in appearance. 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 T1 hypointense, T2 hyperintense lesions in the left kidney, incompletely characterized on today's noncontrast examination, but statistically likely cysts (no imaging follow-up recommended), largest of which is exophytic extending off the lower pole measuring up to 3.4 cm in diameter. Stomach/Bowel: Colonic diverticulosis.  Otherwise, unremarkable. Vascular/Lymphatic: No aneurysm identified in the visualized abdominal vasculature. No lymphadenopathy noted in the  abdomen. Other: No significant volume of ascites noted in the visualized portions of the peritoneal cavity. Musculoskeletal: No aggressive appearing osseous lesions are noted in the visualized portions of the skeleton. IMPRESSION: 1. Study is positive for obstructive choledocholithiasis with an 8 mm stone in the distal common bile duct at the level of the ampulla with severe common bile duct dilatation and moderate intrahepatic biliary ductal dilatation. 2. Gallbladder is also severely distended with some gallbladder wall edema. Given the lack of sonographic Murphy's sign on contemporaneously obtained ultrasound examination, findings are favored to reflect distal biliary tract obstruction rather than acute cholecystitis. 3. Hepatic steatosis. 4. Colonic diverticulosis. 5. Additional incidental findings, as above. Electronically Signed   By: Trudie Reed M.D.   On: 08/19/2023  06:49   US ABDOMEN LIMITED RUQ (LIVER/GB)  Result Date: 08/19/2023 CLINICAL DATA:  Right upper quadrant pain EXAM: ULTRASOUND ABDOMEN LIMITED RIGHT UPPER QUADRANT COMPARISON:  None Available. FINDINGS: Gallbladder: Gallbladder is well distended. Single 3 mm polyp is noted. No gallstones are noted. Common bile duct: Diameter: 12.5 mm. Liver: No focal lesion identified. Within normal limits in parenchymal echogenicity. Portal vein is patent on color Doppler imaging with normal direction of blood flow towards the liver. Other: None. IMPRESSION: 3 mm gallbladder polyp Per consensus statement, gallbladder polyps less than 6 mm are usually benign not requiring follow-up Dilated common bile duct to 12 mm. Correlate with laboratory values. Electronically Signed   By: Alcide Clever M.D.   On: 08/19/2023 02:05   DG Chest 2 View  Result Date: 08/18/2023 CLINICAL DATA:  Chest pain, epigastric pain, and nausea today. EXAM: CHEST - 2 VIEW COMPARISON:  02/25/2023 FINDINGS: Heart size and pulmonary vascularity are normal. Emphysematous changes in the lungs. Fibrosis or atelectasis in the lung bases, greater on the left and unchanged since prior study. No focal consolidation. No pleural effusions or pneumothorax. Calcified and tortuous aorta. Degenerative changes in the spine and shoulders. IMPRESSION: Infiltration or atelectasis in the lung bases. Emphysematous changes. No focal consolidation. Electronically Signed   By: Burman Nieves M.D.   On: 08/18/2023 22:38    Labs on Admission: I have personally reviewed following labs  CBC: Recent Labs  Lab 08/18/23 2217  WBC 11.1*  HGB 11.0*  HCT 34.2*  MCV 80.1  PLT 245   Basic Metabolic Panel: Recent Labs  Lab 08/18/23 2217  NA 130*  K 3.7  CL 95*  CO2 26  GLUCOSE 181*  BUN 22  CREATININE 0.87  CALCIUM 9.2   GFR: Estimated Creatinine Clearance: 33.4 mL/min (by C-G formula based on SCr of 0.87 mg/dL).  Liver Function Tests: Recent Labs  Lab  08/18/23 2217  AST 110*  ALT 62*  ALKPHOS 66  BILITOT 1.1  PROT 7.1  ALBUMIN 3.8   Recent Labs  Lab 08/18/23 2217  LIPASE 68*   Urine analysis:    Component Value Date/Time   COLORURINE YELLOW (A) 08/19/2023 0123   APPEARANCEUR HAZY (A) 08/19/2023 0123   APPEARANCEUR CANCELED 11/24/2021 1341   LABSPEC 1.013 08/19/2023 0123   LABSPEC 1.012 03/04/2014 1535   PHURINE 5.0 08/19/2023 0123   GLUCOSEU NEGATIVE 08/19/2023 0123   GLUCOSEU 50 mg/dL 16/08/9603 5409   HGBUR NEGATIVE 08/19/2023 0123   BILIRUBINUR NEGATIVE 08/19/2023 0123   BILIRUBINUR CANCELED 11/24/2021 1341   BILIRUBINUR Negative 03/04/2014 1535   KETONESUR NEGATIVE 08/19/2023 0123   PROTEINUR NEGATIVE 08/19/2023 0123   UROBILINOGEN 0.2 05/27/2020 1240   NITRITE POSITIVE (A) 08/19/2023 0123   LEUKOCYTESUR  SMALL (A) 08/19/2023 0123   LEUKOCYTESUR 3+ 03/04/2014 1535   This document was prepared using Dragon Voice Recognition software and may include unintentional dictation errors.  Dr. Sedalia Muta [facility]  If 7PM-7AM, please contact overnight-coverage provider If 7AM-7PM, please contact day attending provider [email or web address]  08/19/2023, 2:13 PM

## 2023-08-19 NOTE — ED Notes (Signed)
Pt given sips of water. Tolerated well. Family at bedside.

## 2023-08-19 NOTE — Assessment & Plan Note (Addendum)
Patient presenting with apparent failure to thrive with possible sepsis, etiology was not worked up due to patient's family requesting comfort. --Hospice to follow at [facility] --Comfort prescriptions sent including morphine, ativan, haldol, robinul, zofran --Only resumed home meds that provide comfort (meloxicam, Amitiza)

## 2023-08-19 NOTE — Hospital Course (Signed)
Ms. Channel Papandrea is a 89 year old female with history of tobacco use, quit more than 20 years ago, hypertension, history of DVT on Eliquis, non-insulin-dependent diabetes mellitus, hyperlipidemia, who presents to the emergency department late evening of 08/18/2023, from [facility] for chief concerns of epigastric pain while eating dinner.  Initial vitals in the ED showed temperature of 97.9, respiration rate of 16, heart rate of 52, blood pressure 182/56, SpO2 of 96% on room air.  Throughout ED course, patient developed severe sepsis with septic shock, Tmax of 101.4, blood pressure decreasing to 95/38, MAP of 56.  Patient was initially started on vasopressor, Levophed.  Right upper quadrant abdominal ultrasound said 3 mm gallbladder polyp per consensus statement, gallbladder polyps less than 6 mm usually benign not requiring follow-up.  Dilated common bile duct to 12 mm.  MRCP was ordered and read as positive for obstructive choledocholithiasis, 8 mm stone in the distal common bile duct at the level of the ampulla with severe common bile duct dilatation and moderate intrahepatic biliary ductal dilatation.  Gallbladder severely distended with some gallbladder wall edema.  Findings are favored to reflect distal biliary tract obstruction rather than acute cholecystitis.  Hepatic steatosis.  Colonic diverticulosis.  ED treatments include Haldol 2 mg IV, fentanyl 25 mcg, Toradol 15 mg, Ativan, ondansetron 4 mg IV, ceftriaxone 1 g IV, aztreonam, Flagyl, sodium chloride bolus totaling 2.25 L.  Levophed gtt.  ERCP was not available at [facility].  [facility], [facility] were called and declined patient due to no bed availability.  [facility] was called for transfer initiation.  Ultimately family decided on comfort measures with palliative care admission.  Transfer process was discontinued by ED.

## 2023-08-19 NOTE — ED Notes (Addendum)
Pt provided with peri care, new brief and transferred to a hospital bed with new linen. Warm blanket provided and pt hooked back up to b/p cuff and spo2 monitor. Call light within reach, bed in lowest position. Family at bedside.

## 2023-08-19 NOTE — ED Provider Notes (Addendum)
[facility] Provider Note    Event Date/Time   First MD Initiated Contact with Patient 08/19/23 [phone]     (approximate)   History   Chest Pain   HPI  Monica [name] is a 89 y.o. female brought to the ED via EMS from [facility] with a chief complaint of epigastric pain since 4 PM while waiting to eat dinner.  Patient endorses associated nausea.  Still has her gallbladder.  Denies fever/chills, chest pain, shortness of breath, vomiting, dysuria or diarrhea.     Past Medical History   Past Medical History:  Diagnosis Date   Atrial fibrillation (HCC)    Bronchitis    Cancer (HCC)    Left leg growth, kidneys, lungs and breasts   Carcinoma of unknown primary (HCC)    COPD (chronic obstructive pulmonary disease) (HCC)    Diabetes mellitus without complication (HCC)    Hyperlipidemia    Hypertension    Pancreatitis    Pneumonia    Stroke (HCC)    TIA's   Vitamin B12 deficiency      Active Problem List   Patient Active Problem List   Diagnosis Date Noted   GI bleed 04/13/2021   Right hip pain 11/02/2020   S/P hip replacement, right 11/02/2020   Need for vaccination against Streptococcus pneumoniae using pneumococcal conjugate vaccine 13 11/02/2020   Microcytic anemia    DOE (dyspnea on exertion) 09/09/2020   Acute on chronic anemia 09/08/2020   Fracture of femoral neck, right (HCC) 04/19/2020   Diabetes mellitus without complication (HCC) 04/19/2020   Fall at home, initial encounter 04/19/2020   Supratherapeutic INR 11/22/2019   Scalp hematoma, subsequent encounter 11/17/2019   Scalp laceration, subsequent encounter 10/26/2019   Muscle cramping 10/26/2019   Long term current use of anticoagulant therapy 10/26/2019   Encounter for general adult medical examination with abnormal findings 08/21/2019   Cellulitis of right buttock 07/25/2019   Diabetic neuropathy (HCC) 06/25/2019   Type 2 diabetes mellitus with vascular disease (HCC) 06/25/2019    Pressure injury of skin 06/05/2019   'Light-for-dates' infant with signs of fetal malnutrition 05/31/2019   Closed left hip fracture (HCC) 05/31/2019   Inflammatory polyarthritis (HCC) 05/09/2019   Deep vein thrombosis (DVT) of non-extremity vein 11/29/2018   Edema of left lower extremity 11/13/2018   Cellulitis of left lower leg 11/01/2018   Encounter for therapeutic drug level monitoring 07/05/2018   Allergic contact dermatitis 04/19/2018   Atopic dermatitis 03/15/2018   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   Atrial fibrillation, chronic (HCC) 02/12/2018   Melena    Urinary tract infection with hematuria 01/04/2018   Chronic obstructive pulmonary disease (HCC) 12/27/2017   Acute cystitis with hematuria 12/27/2017   Dysuria 12/27/2017   Urinary frequency 12/27/2017   Uncontrolled type 2 diabetes mellitus with hyperglycemia (HCC) 12/27/2017   Iron deficiency anemia due to chronic blood loss 09/28/2016   Avitaminosis D 09/09/2016   B12 deficiency 09/09/2016   Temporary cerebral vascular dysfunction 09/09/2016   Spinal stenosis 09/09/2016   Pain in shoulder 09/09/2016   Restless legs syndrome 09/09/2016   Personal history of urinary calculi 09/09/2016   H/O deep venous thrombosis 09/09/2016   Gout 09/09/2016   Accumulation of fluid in tissues 09/09/2016   Carpal tunnel syndrome 09/09/2016   Chronic lung disease 09/09/2016   Cataract 09/09/2016   Appendicular ataxia 09/09/2016   Airway hyperreactivity 09/09/2016   Rectal bleeding    Acute blood loss anemia  Gastrointestinal hemorrhage 08/08/2016   Carcinoma of unknown primary (HCC) 08/02/2016   Diabetes (HCC) 05/08/2015   Arthropathy of hand 02/04/2010   Hypertonicity of bladder 08/31/2009   Detrusor instability of bladder 06/18/2009   Pure hypercholesterolemia 04/10/2009   Essential hypertension 04/10/2009   Diverticulitis of colon 04/10/2009     Past Surgical History   Past Surgical History:  Procedure  Laterality Date   ABDOMINAL HYSTERECTOMY  1975   BLADDER SUSPENSION  1989   CAROTID ANGIOGRAPHY Right 11/23/2019   Procedure: CAROTID ANGIOGRAPHY;  Surgeon: Renford Dills, MD;  Location: [facility] INVASIVE CV LAB;  Service: Cardiovascular;  Laterality: Right;   COLONOSCOPY     COLONOSCOPY WITH PROPOFOL N/A 01/25/2018   Procedure: COLONOSCOPY WITH PROPOFOL;  Surgeon: Midge Minium, MD;  Location: [facility] ENDOSCOPY;  Service: Endoscopy;  Laterality: N/A;   ESOPHAGOGASTRODUODENOSCOPY (EGD) WITH PROPOFOL N/A 01/24/2018   Procedure: ESOPHAGOGASTRODUODENOSCOPY (EGD) WITH PROPOFOL;  Surgeon: Midge Minium, MD;  Location: [facility] ENDOSCOPY;  Service: Endoscopy;  Laterality: N/A;   HIP ARTHROPLASTY Left 06/03/2019   Procedure: ARTHROPLASTY BIPOLAR HIP (HEMIARTHROPLASTY);  Surgeon: Donato Heinz, MD;  Location: [facility] ORS;  Service: Orthopedics;  Laterality: Left;   HIP ARTHROPLASTY Right 04/21/2020   Procedure: ARTHROPLASTY BIPOLAR HIP (HEMIARTHROPLASTY);  Surgeon: Christena Flake, MD;  Location: [facility] ORS;  Service: Orthopedics;  Laterality: Right;     Home Medications   Prior to Admission medications   Medication Sig Start Date End Date Taking? Authorizing Provider  albuterol (VENTOLIN HFA) 108 (90 Base) MCG/ACT inhaler INHALE TWO PUFFS INTO THE LUNGS EVERY 4 HOURS AS NEEDED FOR WHEEZING OR SHORTNESS OF BREATH 03/22/23   Abernathy, Arlyss Repress, NP  amLODipine-benazepril (LOTREL) 10-20 MG capsule Take 1 capsule by mouth daily. 04/05/23   Sallyanne Kuster, NP  apixaban (ELIQUIS) 2.5 MG TABS tablet Take 1 tablet (2.5 mg total) by mouth 2 (two) times daily. 04/05/23   Sallyanne Kuster, NP  clotrimazole-betamethasone (LOTRISONE) cream Apply 1 Application topically 2 (two) times daily. 10/19/22   Sallyanne Kuster, NP  feeding supplement (BOOST HIGH PROTEIN) LIQD Take 1 Container by mouth as needed.    [provider]  gabapentin (NEURONTIN) 300 MG capsule Take 2 capsules (600 mg total) by mouth at bedtime. 12/01/22    Sallyanne Kuster, NP  glucose blood (ONETOUCH ULTRA) test strip 1 each by Other route daily. Use as instructed 07/26/19   Carlean Jews, NP  lubiprostone (AMITIZA) 8 MCG capsule Take 1 capsule (8 mcg total) by mouth 2 (two) times daily with a meal. 05/23/23   Abernathy, Arlyss Repress, NP  meloxicam (MOBIC) 7.5 MG tablet Take 1 tablet (7.5 mg total) by mouth daily. 01/20/23   Sallyanne Kuster, NP  metFORMIN (GLUCOPHAGE-XR) 500 MG 24 hr tablet TAKE ONE (1) TABLET BY MOUTH TWO TIMES PER DAY WITH A MEAL 04/20/23   Abernathy, Alyssa, NP  montelukast (SINGULAIR) 10 MG tablet TAKE ONE TABLET (10 MG) BY MOUTH AT BEDTIME 01/20/23   Sallyanne Kuster, NP  mupirocin ointment (BACTROBAN) 2 % Apply 1 Application topically daily. To wound on buttocks until healed. 01/20/23   Sallyanne Kuster, NP  NYSTATIN powder APPLY 1 APPLICATION TOPICALLY 3 TIMES DAILY TO GROIN AREA UNTIL RASH RESOLVES 06/28/23   Sallyanne Kuster, NP  OneTouch Delica Lancets 30G MISC Use as directed once daily as needed 09/11/20   Carlean Jews, NP  oxybutynin (DITROPAN-XL) 10 MG 24 hr tablet TAKE ONE TABLET BY MOUTH EVERY DAY 05/23/23   Sallyanne Kuster, NP  THEO-24 100 MG 24 hr  capsule Take 1 capsule (100 mg total) by mouth daily. 12/01/22   Sallyanne Kuster, NP  famotidine (PEPCID) 20 MG tablet Take 1 tablet (20 mg total) by mouth 2 (two) times daily. 08/11/16 11/28/19  Katha Hamming, MD  pravastatin (PRAVACHOL) 40 MG tablet Take 1 tablet (40 mg total) by mouth daily. 08/15/19 11/28/19  Carlean Jews, NP     Allergies  Sulfa antibiotics, Celecoxib, Acetaminophen, Codeine, Lyrica [pregabalin], Penicillin g, and Petrolatum-zinc oxide   Family History   Family History  Problem Relation Age of Onset   Cancer Father    Diabetes Brother      Physical Exam  Triage Vital Signs: ED Triage Vitals  Encounter Vitals Group     BP 08/18/23 2212 (!) 182/56     Systolic BP Percentile --      Diastolic BP Percentile --      Pulse Rate  08/18/23 2202 (!) 52     Resp 08/18/23 2202 16     Temp 08/18/23 2202 97.9 F (36.6 C)     Temp Source 08/18/23 2202 Oral     SpO2 08/18/23 2202 96 %     Weight 08/18/23 2212 125 lb 10.6 oz (57 kg)     Height 08/18/23 2212 5\' 4"  (1.626 m)     Head Circumference --      Peak Flow --      Pain Score 08/18/23 2211 7     Pain Loc --      Pain Education --      Exclude from Growth Chart --     Updated Vital Signs: BP (!) 135/55   Pulse (!) 110   Temp (!) 101.4 F (38.6 C)   Resp 16   Ht 5\' 4"  (1.626 m)   Wt 57 kg   SpO2 93%   BMI 21.57 kg/m    General: Awake, mild distress.  CV:  Bradycardic.  Good peripheral perfusion.  Resp:  Normal effort.  CTAB. Abd:  Moderately tender to palpation right upper quadrant without rebound or guarding.  No distention.  Other:  No truncal vesicles.   ED Results / Procedures / Treatments  Labs (all labs ordered are listed, but only abnormal results are displayed) Labs Reviewed  BASIC METABOLIC PANEL - Abnormal; Notable for the following components:      Result Value   Sodium 130 (*)    Chloride 95 (*)    Glucose, Bld 181 (*)    All other components within normal limits  CBC - Abnormal; Notable for the following components:   WBC 11.1 (*)    Hemoglobin 11.0 (*)    HCT 34.2 (*)    MCH 25.8 (*)    All other components within normal limits  HEPATIC FUNCTION PANEL - Abnormal; Notable for the following components:   AST 110 (*)    ALT 62 (*)    Bilirubin, Direct 0.5 (*)    All other components within normal limits  LIPASE, BLOOD - Abnormal; Notable for the following components:   Lipase 68 (*)    All other components within normal limits  URINALYSIS, ROUTINE W REFLEX MICROSCOPIC - Abnormal; Notable for the following components:   Color, Urine YELLOW (*)    APPearance HAZY (*)    Nitrite POSITIVE (*)    Leukocytes,Ua SMALL (*)    Bacteria, UA FEW (*)    All other components within normal limits  URINE CULTURE  CULTURE, BLOOD  (ROUTINE X 2)  CULTURE, BLOOD (ROUTINE  X 2)  LACTIC ACID, PLASMA  PROCALCITONIN  LACTIC ACID, PLASMA  TROPONIN I (HIGH SENSITIVITY)  TROPONIN I (HIGH SENSITIVITY)     EKG  ED ECG REPORT I, Martie Muhlbauer J, the attending physician, personally viewed and interpreted this ECG.   Date: 08/19/2023  EKG Time: 2207  Rate: 62  Rhythm: normal sinus rhythm  Axis: Normal  Intervals: PACs  ST&T Change: Nonspecific    RADIOLOGY I have independently visualized and interpreted patient's imaging studies as well as noted the radiology interpretation:  X-ray: Bibasilar infiltrates versus atelectasis  Ultrasound: Gallbladder polyp, CBD dilated to 12 mm  MRCP: Choledocholithiasis   Official radiology report(s): MR ABDOMEN MRCP WO CONTRAST  Result Date: 08/19/2023 CLINICAL DATA:  89 year old female with history of right upper quadrant abdominal pain. Dilated common bile duct on ultrasound examination. Evaluate for biliary tract obstruction. EXAM: MRI ABDOMEN WITHOUT CONTRAST  (INCLUDING MRCP) TECHNIQUE: Multiplanar multisequence MR imaging of the abdomen was performed. Heavily T2-weighted images of the biliary and pancreatic ducts were obtained, and three-dimensional MRCP images were rendered by post processing. COMPARISON:  None Available. FINDINGS: Comment: Today's study is limited for detection and characterization of visceral and/or vascular lesions by lack of IV gadolinium. Lower chest: Unremarkable. Hepatobiliary: No definite suspicious cystic or solid hepatic lesions are confidently identified on today's noncontrast examination. Diffuse loss of signal intensity throughout the hepatic parenchyma on out of phase dual echo images, indicative of a background of hepatic steatosis. Gallbladder is severely distended. No definite filling defects are noted within the lumen of the gallbladder to suggest gallstones. Gallbladder wall appears mildly edematous. Moderate intrahepatic biliary ductal dilatation.  Severe dilatation of the common bile duct measuring up to 15 mm in the distal common bile duct. 8 mm filling defect at the level of the ampulla, indicative of choledocholithiasis (coronal image 26 of series 3). Pancreas: No definite pancreatic mass or peripancreatic fluid collections or inflammatory changes noted on today's noncontrast examination. No pancreatic ductal dilatation. Spleen:  Unremarkable. Adrenals/Urinary Tract: Right kidney and bilateral adrenal glands are unremarkable in appearance. 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 T1 hypointense, T2 hyperintense lesions in the left kidney, incompletely characterized on today's noncontrast examination, but statistically likely cysts (no imaging follow-up recommended), largest of which is exophytic extending off the lower pole measuring up to 3.4 cm in diameter. Stomach/Bowel: Colonic diverticulosis.  Otherwise, unremarkable. Vascular/Lymphatic: No aneurysm identified in the visualized abdominal vasculature. No lymphadenopathy noted in the abdomen. Other: No significant volume of ascites noted in the visualized portions of the peritoneal cavity. Musculoskeletal: No aggressive appearing osseous lesions are noted in the visualized portions of the skeleton. IMPRESSION: 1. Study is positive for obstructive choledocholithiasis with an 8 mm stone in the distal common bile duct at the level of the ampulla with severe common bile duct dilatation and moderate intrahepatic biliary ductal dilatation. 2. Gallbladder is also severely distended with some gallbladder wall edema. Given the lack of sonographic Murphy's sign on contemporaneously obtained ultrasound examination, findings are favored to reflect distal biliary tract obstruction rather than acute cholecystitis. 3. Hepatic steatosis. 4. Colonic diverticulosis. 5.  Additional incidental findings, as above. Electronically Signed   By: Trudie Reed M.D.   On: 08/19/2023 06:49   US ABDOMEN LIMITED RUQ (LIVER/GB)  Result Date: 08/19/2023 CLINICAL DATA:  Right upper quadrant pain EXAM: ULTRASOUND ABDOMEN LIMITED RIGHT UPPER QUADRANT COMPARISON:  None Available. FINDINGS: Gallbladder: Gallbladder is well distended. Single 3 mm polyp is noted. No gallstones are noted. Common bile duct: Diameter: 12.5 mm. Liver: No focal lesion identified. Within normal limits in parenchymal echogenicity. Portal vein is patent on color Doppler imaging with normal direction of blood flow towards the liver. Other: None. IMPRESSION: 3 mm gallbladder polyp Per consensus statement, gallbladder polyps less than 6 mm are usually benign not requiring follow-up Dilated common bile duct to 12 mm. Correlate with laboratory values. Electronically Signed   By: Alcide Clever M.D.   On: 08/19/2023 02:05   DG Chest 2 View  Result Date: 08/18/2023 CLINICAL DATA:  Chest pain, epigastric pain, and nausea today. EXAM: CHEST - 2 VIEW COMPARISON:  02/25/2023 FINDINGS: Heart size and pulmonary vascularity are normal. Emphysematous changes in the lungs. Fibrosis or atelectasis in the lung bases, greater on the left and unchanged since prior study. No focal consolidation. No pleural effusions or pneumothorax. Calcified and tortuous aorta. Degenerative changes in the spine and shoulders. IMPRESSION: Infiltration or atelectasis in the lung bases. Emphysematous changes. No focal consolidation. Electronically Signed   By: Burman Nieves M.D.   On: 08/18/2023 22:38     PROCEDURES:  Critical Care performed: Yes CRITICAL CARE Performed by: Irean Hong   Total critical care time: 45 minutes  Critical care time was exclusive of separately billable procedures and treating other patients.  Critical care was necessary to treat or prevent imminent or life-threatening deterioration.  Critical care was time  spent personally by me on the following activities: development of treatment plan with patient and/or surrogate as well as nursing, discussions with consultants, evaluation of patient's response to treatment, examination of patient, obtaining history from patient or surrogate, ordering and performing treatments and interventions, ordering and review of laboratory studies, ordering and review of radiographic studies, pulse oximetry and re-evaluation of patient's condition.   Marland Kitchen1-3 Lead EKG Interpretation  Performed by: Irean Hong, MD Authorized by: Irean Hong, MD     Interpretation: normal     ECG rate:  60   ECG rate assessment: normal     Rhythm: sinus rhythm     Ectopy: none     Conduction: normal   Comments:     Patient placed on cardiac monitor to evaluate for arrhythmias    MEDICATIONS ORDERED IN ED: Medications  sodium chloride 0.9 % bolus 500 mL (has no administration in time range)    And  sodium chloride 0.9 % bolus 250 mL (has no administration in time range)  aztreonam (AZACTAM) 2 g in sodium chloride 0.9 % 100 mL IVPB (has no administration in time range)  metroNIDAZOLE (FLAGYL) IVPB 500 mg (has no administration in time range)  ondansetron (ZOFRAN) injection 4 mg (4 mg Intravenous Given 08/18/23 2220)  sodium chloride 0.9 % bolus 1,000 mL (0 mLs Intravenous Stopped 08/19/23 0231)  ondansetron (ZOFRAN) injection 4 mg (4 mg Intravenous Given 08/19/23 0111)  fentaNYL (SUBLIMAZE) injection 25 mcg (25 mcg Intravenous Given 08/19/23 0111)  cefTRIAXone (ROCEPHIN) 1 g in sodium chloride 0.9 % 100 mL IVPB (0 g Intravenous Stopped 08/19/23 0259)  LORazepam (ATIVAN) injection 0.5 mg (0.5 mg Intravenous Given 08/19/23 0256)  gadobutrol (GADAVIST) 1 MMOL/ML injection 6 mL (6 mLs Intravenous Contrast Given 08/19/23 0240)  haloperidol lactate (HALDOL) injection 2 mg (2 mg Intravenous Given 08/19/23 0419)     IMPRESSION / MDM / ASSESSMENT AND PLAN / ED COURSE  I reviewed the triage vital  signs and the  nursing notes.                             89 year old female presenting with upper abdominal pain. Differential diagnosis includes, but is not limited to, biliary disease (biliary colic, acute cholecystitis, cholangitis, choledocholithiasis, etc), intrathoracic causes for epigastric abdominal pain including ACS, gastritis, duodenitis, pancreatitis, small bowel or large bowel obstruction, abdominal aortic aneurysm, hernia, and ulcer(s).  I personally reviewed patient's records and noted a cardiology office visit from 07/28/2023 for paroxysmal atrial fibrillation.  Patient's presentation is most consistent with acute presentation with potential threat to life or bodily function.  The patient is on the cardiac monitor to evaluate for evidence of arrhythmia and/or significant heart rate changes.  Laboratory results demonstrate mild hyponatremia with sodium 130, minimally elevated transaminases with normal bilirubin and minimally elevated lipase.  Initial troponin negative.  Chest x-ray demonstrates bibasilar atelectasis versus infiltrate; patient does not give history of fever, chills or cough so infiltrates less likely.  Pending right upper quadrant abdominal ultrasound.  Will administer IV fluids, low-dose IV Fentanyl for pain paired with IV Zofran for nausea.  Will reassess.  Clinical Course as of 08/19/23 9147  Caleen Essex Aug 19, 2023  8295 Updated patient and her daughters on nitrite and leukocyte positive UTI, negative repeat troponin, ultrasound demonstrating polyps and dilated CBD.  Will obtain MRCP; patient will require low-dose IV calming agent. [JS]  0410 Patient returned from MRI, agitated.  Will administer low-dose IV Haldol. [JS]  0507 Patient resting, awaiting results of MRCP. [JS]  0621 There was some confusion regarding patient's MRCP.  Daughters thought she was unable to have it done secondary to agitation.  I am told she was able to have some images for a limited study and we  are awaiting radiology interpretation.  Daughters mention patient can tolerate CT scan so we will proceed with CT abdomen/pelvis while awaiting MRCP results which will likely be indeterminant. [JS]  [phone] Care will be transferred to the oncoming provider at change of shift pending CT scan, MRCP interpretation, disposition either admit to this facility versus transfer. [JS]  0654 MRCP positive for choledocholithiasis.  Will discuss with GI on-call.  Have canceled CT scan. [JS]  0705 Patient now febrile, will obtain blood cultures, lactic acid, procalcitonin.  ED code sepsis protocol initiated; additional broad-spectrum IV antibiotics added. [JS]  0865 Discussed case with Dr. Timothy Lasso from GI; unfortunately Dr. Servando Snare is unavailable today for ERCP so patient will need transfer.  Daughter tells me patient is allergic to acetaminophen, describes swelling in addition to itching.  Will consult pharmacy for antipyretic recommendations as patient is also allergic to Celebrex (nausea/vomiting).  Care transferred to Dr. Anner Crete who will contact [facility] transfer center. [JS]    Clinical Course User Index [JS] Irean Hong, MD     FINAL CLINICAL IMPRESSION(S) / ED DIAGNOSES   Final diagnoses:  Right upper quadrant abdominal pain  Nausea  Urinary tract infection without hematuria, site unspecified  Choledocholithiasis  Sepsis, due to unspecified organism, unspecified whether acute organ dysfunction present (HCC)  Fever, unspecified fever cause     Rx / DC Orders   ED Discharge Orders     None        Note:  This document was prepared using Dragon voice recognition software and may include unintentional dictation errors.   Irean Hong, MD 08/19/23 7846    Irean Hong, MD 08/19/23 [phone]

## 2023-08-19 NOTE — ED Notes (Signed)
Pt family informed this nurse that they wanted to pursue palliative care. ED MD notified.  ED provider will talk to hospitalist.

## 2023-08-19 NOTE — Assessment & Plan Note (Signed)
Per family, patient quit more than 20 years ago

## 2023-08-19 NOTE — Progress Notes (Signed)
PHARMACY -  BRIEF ANTIBIOTIC NOTE   Pharmacy has received consult(s) for aztreonam from an ED provider.  The patient's profile has been reviewed for ht/wt/allergies/indication/available labs.    One time order(s) placed for aztreonam 2 g IV x 1  Further antibiotics/pharmacy consults should be ordered by admitting physician if indicated.                       Thank you, Merryl Hacker, PharmD Clinical Pharmacist 08/19/2023  7:11 AM

## 2023-08-19 NOTE — ED Notes (Signed)
Called back to follow up with [facility] spoke with rep. Monica she stated she gave the info to the transfer nurse and they working on it and will get back to [place] as soon as they can.

## 2023-08-19 NOTE — ED Notes (Signed)
Per Dr. Leonia Reeves [facility] did not accept the the pt. calling Monica [name].

## 2023-08-19 NOTE — ED Notes (Signed)
[facility], spoke with rep. Rhoda she stated [facility] did not have the capacity to take the pt. calling [facility].

## 2023-08-19 NOTE — Assessment & Plan Note (Signed)
Family did not want aggressive IV fluid, required vasopressor, Levophed

## 2023-08-19 NOTE — ED Notes (Signed)
Patient placed on 2L [place].

## 2023-08-19 NOTE — ED Notes (Signed)
[facility] shared all images and faxed demo sheet to [facility].

## 2023-08-19 NOTE — ED Notes (Signed)
[facility] transfer center spoke with rep. Monica she some brief information about the pt. and stated we will get a call back from some to finish the transfer.

## 2023-08-19 NOTE — ED Notes (Signed)
Pt. Family decided to go palliative care for pt, called [facility] spoke with Angelique and removed pt. off the transfer list.

## 2023-08-19 NOTE — Sepsis Progress Note (Signed)
eLink is following this Code Sepsis. °

## 2023-08-19 NOTE — ED Notes (Signed)
Called [facility] spoke with rep. Alex, power shared X-rays ,ultrasounds, and faxed facesheet to [facility]. Transferred rep. To dr. Anner Crete.

## 2023-08-19 NOTE — ED Notes (Signed)
Pt returned form MRI and staff reports pt was not able to hold still and was trying to take things off. Pt returns to ER and is agitated and wanting to take things off. Pt able to be redirected for short time. Family at bedside and MD made aware.

## 2023-08-19 NOTE — Care Plan (Signed)
Brief GI Note  D/w EM Physician as well as patient's family at bedside. Pt p/w choledocholithiasis/cholangitis as seen on MRI MRCP ERCP not available at this facility at this time, and transfer to multiple hospitals has not been successful PTC was presented as an option, but family discussed and given her worsening condition despite abx (now requiring pressors) family would like to move to comfort oriented measures and respect the patient's wishes to not undergo invasive measures.  Multiple daughters and granddaughters at bedside in agreement.  Enis Slipper, DO [facility]

## 2023-08-20 DIAGNOSIS — Z515 Encounter for palliative care: Secondary | ICD-10-CM | POA: Diagnosis not present

## 2023-08-20 MED ORDER — ALBUTEROL SULFATE (2.5 MG/3ML) 0.083% IN NEBU: 2.5000 mg | INHALATION_SOLUTION | Freq: Four times a day (QID) | RESPIRATORY_TRACT | Status: DC | PRN

## 2023-08-20 MED ORDER — BIOTENE DRY MOUTH MT LIQD: 15.0000 mL | OROMUCOSAL | Status: DC | PRN

## 2023-08-20 NOTE — Plan of Care (Signed)
  Problem: Education: Goal: Knowledge of General Education information will improve Description: Including pain rating scale, medication(s)/side effects and non-pharmacologic comfort measures Outcome: Progressing   Problem: Education: Goal: Knowledge of the prescribed therapeutic regimen will improve Outcome: Progressing   Problem: Coping: Goal: Ability to identify and develop effective coping behavior will improve Outcome: Progressing   Problem: Clinical Measurements: Goal: Quality of life will improve Outcome: Progressing   Problem: Respiratory: Goal: Verbalizations of increased ease of respirations will increase Outcome: Progressing   Problem: Role Relationship: Goal: Family's ability to cope with current situation will improve Outcome: Progressing Goal: Ability to verbalize concerns, feelings, and thoughts to partner or family member will improve Outcome: Progressing   Problem: Pain Management: Goal: Satisfaction with pain management regimen will improve Outcome: Progressing

## 2023-08-20 NOTE — Progress Notes (Signed)
Progress Note   Patient: Monica [name] [id number] DOB: [birth year] DOA: 08/19/2023     1 DOS: the patient was seen and examined on 08/20/2023   Brief hospital course: HPI on admission 08/19/2023: "[name] is a 89 year old female with history of tobacco use, quit more than 20 years ago, hypertension, history of DVT on Eliquis, non-insulin-dependent diabetes mellitus, hyperlipidemia, who presents to the emergency department late evening of 08/18/2023, from [facility] for chief concerns of epigastric pain while eating dinner.  Initial vitals in the ED showed temperature of 97.9, respiration rate of 16, heart rate of 52, blood pressure 182/56, SpO2 of 96% on room air.  Throughout ED course, patient developed severe sepsis with septic shock, Tmax of 101.4, blood pressure decreasing to 95/38, MAP of 56.  Patient was initially started on vasopressor, Levophed.  Right upper quadrant abdominal ultrasound said 3 mm gallbladder polyp per consensus statement, gallbladder polyps less than 6 mm usually benign not requiring follow-up.  Dilated common bile duct to 12 mm.  MRCP was ordered and read as positive for obstructive choledocholithiasis, 8 mm stone in the distal common bile duct at the level of the ampulla with severe common bile duct dilatation and moderate intrahepatic biliary ductal dilatation.  Gallbladder severely distended with some gallbladder wall edema.  Findings are favored to reflect distal biliary tract obstruction rather than acute cholecystitis.  Hepatic steatosis.  Colonic diverticulosis.  ED treatments include Haldol 2 mg IV, fentanyl 25 mcg, Toradol 15 mg, Ativan, ondansetron 4 mg IV, ceftriaxone 1 g IV, aztreonam, Flagyl, sodium chloride bolus totaling 2.25 L.  Levophed gtt.  [facility]CP was not available at [facility].  [facility], [facility] were called and declined patient due to no bed availability.  [facility] was called for transfer initiation.  Ultimately family decided on comfort  measures with palliative care admission.  Transfer process was discontinued by ED."   Patient admitted for comfort measures / end of life care.  Hospice liaison notified and following.   Assessment and Plan: * End of life care-resolved as of 08/20/2023 Patient presenting with apparent failure to thrive with possible sepsis, etiology was not worked up due to patient's family requesting comfort After discussion in the emergency medicine provider, family has decided to proceed with comfort care only Comfort care order set utilized No antibiotics or IVF as per family's request As needed medications include pain control with morphine as needed, would transition to a drip if needed but patient does not appear to be in pain at this time RN can pronounce death Palliative care consultation was offered, patient declined stating that they have reached a decision for inpatient comfort care.  History of tobacco use Per family, patient quit more than 20 years ago  Septic shock (HCC) Family did not want aggressive IV fluid, required vasopressor, Levophed  Severe sepsis with acute organ dysfunction (HCC) See H&P for details. Comfort care measures only.  Choledocholithiasis .  Diabetes mellitus without complication (HCC) .  Fracture of femoral neck, right (HCC) .  Chronic obstructive pulmonary disease (HCC) .  Iron deficiency anemia due to chronic blood loss .  Essential hypertension .  H/O deep venous thrombosis .  Pure hypercholesterolemia .  Restless legs syndrome .        Subjective: Pt seen with granddaughter at bedside today. Pt sleeping but woke up & engages in conversation. Hard of hearing. She denies pain currently, medication given earlier relieved her pain.  Denies other complaints or needs.  Physical Exam: Vitals:   08/19/23 1315 08/19/23 2050 08/20/23 0522 08/20/23 0819  BP: 126/81 (!) 135/51 (!) 128/56 (!) 161/57  Pulse: 66 68 71 83  Resp: 18 16 16     Temp:  98 F (36.7 C) 98 F (36.7 C) 98 F (36.7 C)  TempSrc:      SpO2: 99% 94% 97% 94%  Weight:      Height:       General exam: awake, alert, no acute distress HEENT: moist mucus membranes, hard of hearing  Respiratory system: CTAB, no wheezes, rales or rhonchi, normal respiratory effort. Cardiovascular system: normal S1/S2, RRR,   Gastrointestinal system: soft non-distended, not palpated for pt comfort Central nervous system: A&O x2+. no gross focal neurologic deficits, normal speech Extremities: moves all, no edema, normal tone Skin: dry, intact, normal temperature Psychiatry: normal mood, congruent affect, judgement and insight appear normal   Data Reviewed:  No labs -- pt comfort care  Family Communication: Granddaughter at bedside on rounds  Disposition: Status is: Inpatient Remains inpatient appropriate because: on comfort measures, end of life care   Planned Discharge Destination:   Hospice at prior facility vs inpatient hospital death     Time spent: 40 minutes  Author: Pennie Banter, DO 08/20/2023 5:02 PM  For on call review [email or web address].

## 2023-08-20 NOTE — Progress Notes (Addendum)
Civil engineer, contracting New Hospice Referral  Received request from Susa Simmonds, SW, Transitions of Care Manager, for evaluation of inpatient hospice unit.  Met with the patient at the bedside to discuss current hospitalization and discharge planning.  She is alert, oriented and very adimant I speak with her and not direct information at her Granddaughter, Lynwood Dawley, who is also at the bedside.  Patient states she wants to be left alone.  She confirms DNR and that she doesn't want to pursue any aggressive treatment for her Gallbladder issues and wants to be comfortable.  She tells me she is not dying and I don't need to discuss that option with her.  Daughters are not present at bedside today.  I will return for a visit tomorrow to explain hospice services with them.  Patient is appropriate for hospice services, however, does not meet criteria for IPU at this time.  Please send signed and completed DNR home with patient/family if applicable. Please provide prescriptions at discharge as needed to ensure ongoing symptom management.  [facility] information and contact numbers given to Lynwood Dawley- who will give to her family.  Above information shared with Susa Simmonds and medical care team at [facility].   Please call with any hospice related questions or concerns.  Thank you for the opportunity to participate in this patient's care.  Norris Cross, RN Nurse Liaison [phone]

## 2023-08-20 NOTE — TOC Progression Note (Signed)
Transition of Care [facility]) - Progression Note    Patient Details  Name: Monica [name] MRN: [id number] Date of Birth: 13-Oct-1926  Transition of Care [facility]) CM/SW Contact  Susa Simmonds, [place] Phone Number: 08/20/2023, 12:29 PM  Clinical Narrative:   CSW spoke with patients daughter Monica [name] who stated the family would be interested in hospice. CSW was told they prefer inpatient if patient qualifies. CSW made hospice liaison aware and patient will be evaluated.          Expected Discharge Plan and Services                                               Social Determinants of Health (SDOH) Interventions SDOH Screenings   Food Insecurity: No Food Insecurity (08/19/2023)  Housing: Low Risk  (08/19/2023)  Transportation Needs: No Transportation Needs (08/19/2023)  Utilities: Not At Risk (08/19/2023)  Alcohol Screen: Low Risk  (04/20/2022)  Depression (PHQ2-9): Low Risk  (12/01/2022)  Tobacco Use: Medium Risk (08/19/2023)    Readmission Risk Interventions     No data to display

## 2023-08-20 NOTE — Plan of Care (Signed)
  Problem: Education: Goal: Knowledge of General Education information will improve Description: Including pain rating scale, medication(s)/side effects and non-pharmacologic comfort measures Outcome: Progressing   Problem: Respiratory: Goal: Verbalizations of increased ease of respirations will increase Outcome: Progressing   Problem: Role Relationship: Goal: Family's ability to cope with current situation will improve Outcome: Progressing Goal: Ability to verbalize concerns, feelings, and thoughts to partner or family member will improve Outcome: Progressing   Problem: Pain Management: Goal: Satisfaction with pain management regimen will improve Outcome: Progressing

## 2023-08-21 DIAGNOSIS — Z515 Encounter for palliative care: Secondary | ICD-10-CM | POA: Diagnosis not present

## 2023-08-21 NOTE — Progress Notes (Signed)
[facility] Referral Note  Follow up from new hospice referral received yesterday.  Patient evaluated for IPU and does not meet criteria at this time.  Met with patient's 3 daughters, Bonnee Quin, [name] Handy and Jamelle Haring to discuss hospice philosophy, services and comfort approach to care.  All in agreement with hospice at discharge.  I spoke with the patient at bedside to initiate conversation regarding discharge location.  She states [facility].  She is not interested in receiving any help.  We discussed hospice role and supporting her goal to go back [place] [facility].  Discussed deconditioning in the hospital and hospice could help her with her goal to be back at her home. (Patient has lived at [facility] for the past 14 years).  She said she would think about it.  Report to medical care team. HLT will continue to follow through final disposition.  Please do not hesitate to call with any hospice related questions or concerns.  Norris Cross, Rn Nurse Liaison [phone]

## 2023-08-21 NOTE — Plan of Care (Signed)
  Problem: Education: Goal: Knowledge of General Education information will improve Description: Including pain rating scale, medication(s)/side effects and non-pharmacologic comfort measures Outcome: Progressing   Problem: Education: Goal: Knowledge of the prescribed therapeutic regimen will improve Outcome: Progressing   Problem: Coping: Goal: Ability to identify and develop effective coping behavior will improve Outcome: Progressing   Problem: Clinical Measurements: Goal: Quality of life will improve Outcome: Progressing   Problem: Respiratory: Goal: Verbalizations of increased ease of respirations will increase Outcome: Progressing   Problem: Role Relationship: Goal: Family's ability to cope with current situation will improve Outcome: Progressing Goal: Ability to verbalize concerns, feelings, and thoughts to partner or family member will improve Outcome: Progressing   Problem: Pain Management: Goal: Satisfaction with pain management regimen will improve Outcome: Progressing

## 2023-08-21 NOTE — TOC Progression Note (Signed)
Transition of Care [facility]) - Progression Note    Patient Details  Name: Monica [name] MRN: [id number] Date of Birth: 1925/12/26  Transition of Care [facility]) CM/SW Contact  Susa Simmonds, [place] Phone Number: 08/21/2023, 4:16 PM  Clinical Narrative:   CSW spoke with Scientist, research (medical) at [facility]. CSW was told patient can return with hospice services. CSW was told if patient needs additional assistance then the family would need to provide that. CSW spoke with patients daughter Hollie Salk to make her ware. CSW will also attached private duty home aide agencies to patients AVS.          Expected Discharge Plan and Services                                               Social Determinants of Health (SDOH) Interventions SDOH Screenings   Food Insecurity: No Food Insecurity (08/19/2023)  Housing: Low Risk  (08/19/2023)  Transportation Needs: No Transportation Needs (08/19/2023)  Utilities: Not At Risk (08/19/2023)  Alcohol Screen: Low Risk  (04/20/2022)  Depression (PHQ2-9): Low Risk  (12/01/2022)  Tobacco Use: Medium Risk (08/19/2023)    Readmission Risk Interventions     No data to display

## 2023-08-21 NOTE — Plan of Care (Signed)
      Problem: Education: Goal: Knowledge of General Education information will improve Description: Including pain rating scale, medication(s)/side effects and non-pharmacologic comfort measures Outcome: Completed/Met   Problem: Education: Goal: Knowledge of the prescribed therapeutic regimen will improve Outcome: Completed/Met   Problem: Coping: Goal: Ability to identify and develop effective coping behavior will improve Outcome: Completed/Met   Problem: Clinical Measurements: Goal: Quality of life will improve Outcome: Completed/Met   Problem: Respiratory: Goal: Verbalizations of increased ease of respirations will increase Outcome: Completed/Met   Problem: Role Relationship: Goal: Family's ability to cope with current situation will improve Outcome: Completed/Met Goal: Ability to verbalize concerns, feelings, and thoughts to partner or family member will improve Outcome: Completed/Met   Problem: Pain Management: Goal: Satisfaction with pain management regimen will improve Outcome: Completed/Met   Problem: Role Relationship: Goal: Family's ability to cope with current situation will improve Outcome: Completed/Met   Problem: Coping: Goal: Ability to identify and develop effective coping behavior will improve Outcome: Completed/Met

## 2023-08-21 NOTE — Discharge Instructions (Signed)
Private Pay Resources  [facility] Address: [address], [place], [place] [address] Phone: [phone]  [facility] Address: [address], [place], [place] [address] Phone: [phone]  [facility] Address: [address], [place], [place] [address] Phone: [phone]  [facility] Address: [address], , [place] [address] Phone: [phone]  [facility] Address: [address], [place], [place] [address] Phone: [phone]  [facility] Phone: [phone]  [facility] Address:  [address], [place], [place] [address] Phone:  [phone]  [facility] Address:  [address] Phone:  [phone]  [email or web address]  [facility] Phone: [phone]   

## 2023-08-21 NOTE — Progress Notes (Signed)
Progress Note   Patient: Monica [name] [id number]:[id number] DOB: 1925/12/30 DOA: 08/19/2023     2 DOS: the patient was seen and examined on 08/21/2023   Brief hospital course: HPI on admission 08/19/2023: "[name] is a 89 year old female with history of tobacco use, quit more than 20 years ago, hypertension, history of DVT on Eliquis, non-insulin-dependent diabetes mellitus, hyperlipidemia, who presents to the emergency department late evening of 08/18/2023, from [facility] for chief concerns of epigastric pain while eating dinner.  Initial vitals in the ED showed temperature of 97.9, respiration rate of 16, heart rate of 52, blood pressure 182/56, SpO2 of 96% on room air.  Throughout ED course, patient developed severe sepsis with septic shock, Tmax of 101.4, blood pressure decreasing to 95/38, MAP of 56.  Patient was initially started on vasopressor, Levophed.  Right upper quadrant abdominal ultrasound said 3 mm gallbladder polyp per consensus statement, gallbladder polyps less than 6 mm usually benign not requiring follow-up.  Dilated common bile duct to 12 mm.  MRCP was ordered and read as positive for obstructive choledocholithiasis, 8 mm stone in the distal common bile duct at the level of the ampulla with severe common bile duct dilatation and moderate intrahepatic biliary ductal dilatation.  Gallbladder severely distended with some gallbladder wall edema.  Findings are favored to reflect distal biliary tract obstruction rather than acute cholecystitis.  Hepatic steatosis.  Colonic diverticulosis.  ED treatments include Haldol 2 mg IV, fentanyl 25 mcg, Toradol 15 mg, Ativan, ondansetron 4 mg IV, ceftriaxone 1 g IV, aztreonam, Flagyl, sodium chloride bolus totaling 2.25 L.  Levophed gtt.  ERCP was not available at [facility].  [facility], [facility] called and declined patient due to no bed availability.  [facility] was called for transfer initiation.  Ultimately family decided on comfort  measures with palliative care admission.  Transfer process was discontinued by ED."   Patient admitted for comfort measures / end of life care.  Hospice liaison notified and following.   Assessment and Plan: * End of life care-resolved as of 08/20/2023 Patient presenting with apparent failure to thrive with possible sepsis, etiology was not worked up due to patient's family requesting comfort After discussion in the emergency medicine provider, family has decided to proceed with comfort care only Comfort care order set utilized No antibiotics or IVF as per family's request As needed medications include pain control with morphine as needed, would transition to a drip if needed but patient does not appear to be in pain at this time RN can pronounce death Palliative care consultation was offered, patient declined stating that they have reached a decision for inpatient comfort care.  History of tobacco use Per family, patient quit more than 20 years ago  Septic shock (HCC) Family did not want aggressive IV fluid, required vasopressor, Levophed  Severe sepsis with acute organ dysfunction (HCC) See H&P for details. Comfort care measures only.  Choledocholithiasis .  Diabetes mellitus without complication (HCC) .  Fracture of femoral neck, right (HCC) .  Chronic obstructive pulmonary disease (HCC) .  Iron deficiency anemia due to chronic blood loss .  Essential hypertension .  H/O deep venous thrombosis .  Pure hypercholesterolemia .  Restless legs syndrome .        Subjective: Pt seen with two daughters at bedside today.  She denies abdominal pain.  She complains of having some blood in her phlegm and thinks the blood is coming from inside her head.  Family report pt  fell several weeks ago, struck back of her head.  Pt perseverating on having bleeding in her head, stating she needs a head xray.  Denies headache currently.  Pt ultimately gets frustrated and feels we  don't believe her.  Tells everyone to leave the room.   Physical Exam: Vitals:   08/19/23 2050 08/20/23 0522 08/20/23 0819 08/21/23 0909  BP: (!) 135/51 (!) 128/56 (!) 161/57 (!) 178/73  Pulse: 68 71 83 81  Resp: 16 16  16   Temp: 98 F (36.7 C) 98 F (36.7 C) 98 F (36.7 C) 98.1 F (36.7 C)  TempSrc:      SpO2: 94% 97% 94% 95%  Weight:      Height:       General exam: awake, alert, no acute distress HEENT: moist mucus membranes, hard of hearing, small area of tenderness on posterior scalp with no signs of bleeding or swelling Respiratory system: CTAB, no wheezes, rales or rhonchi, normal respiratory effort. Cardiovascular system: normal S1/S2, RRR  Gastrointestinal system: soft non-distended, not palpated for pt comfort Central nervous system: A&O x2+. no gross focal neurologic deficits, normal speech Extremities: moves all, no edema, normal tone Skin: dry, intact, normal temperature Psychiatry: irritable mood, congruent affect, judgement and insight appear abnormal   Data Reviewed:  No labs -- pt comfort care  Family Communication: Two daughters at bedside on rounds  Disposition: Status is: Inpatient Remains inpatient appropriate because: on comfort measures, end of life care pending plans for d/c with hospice   Planned Discharge Destination:   Likely home with hospice vs [facility] w/ hospice if they can accept     Time spent: 35 minutes  Author: Pennie Banter, DO 08/21/2023 12:41 PM  For on call review [email or web address].

## 2023-08-22 DIAGNOSIS — Z515 Encounter for palliative care: Secondary | ICD-10-CM | POA: Diagnosis not present

## 2023-08-22 LAB — URINE CULTURE: Culture: >=100000 — AB

## 2023-08-22 MED ORDER — BIOTENE DRY MOUTH MT LIQD: 15.0000 mL | OROMUCOSAL | Status: AC | PRN

## 2023-08-22 MED ORDER — ONDANSETRON 4 MG PO TBDP: 4.0000 mg | ORAL_TABLET | Freq: Four times a day (QID) | ORAL | 0 refills | Status: AC | PRN

## 2023-08-22 MED ORDER — GLYCOPYRROLATE 1 MG PO TABS: 1.0000 mg | ORAL_TABLET | ORAL | 0 refills | Status: DC | PRN

## 2023-08-22 MED ORDER — LORAZEPAM 0.5 MG PO TABS: 0.5000 mg | ORAL_TABLET | Freq: Three times a day (TID) | ORAL | 0 refills | Status: DC | PRN

## 2023-08-22 MED ORDER — POLYVINYL ALCOHOL 1.4 % OP SOLN: 1.0000 [drp] | Freq: Four times a day (QID) | OPHTHALMIC | 0 refills | Status: AC | PRN

## 2023-08-22 MED ORDER — HALOPERIDOL 1 MG PO TABS: 1.0000 mg | ORAL_TABLET | Freq: Three times a day (TID) | ORAL | 2 refills | Status: AC | PRN

## 2023-08-22 MED ORDER — MORPHINE SULFATE 20 MG/5ML PO SOLN
5.0000 mg | ORAL | 0 refills | Status: DC | PRN
Start: 2023-08-22 — End: 2023-10-29

## 2023-08-22 NOTE — TOC Transition Note (Signed)
Transition of Care [facility]) - CM/SW Discharge Note   Patient Details  Name: Monica [name] MRN: [id number] Date of Birth: [birth year]  [facility] CM/SW Contact:  Allena Katz, LCSW Phone Number: 08/22/2023, 2:36 PM   Clinical Narrative:   Pt adamant she does not want additional assistance at home. Pt understands that she will be discharged home via ems and not have any additional assistance. Pt is agreeable to having PCS resources added to AVS but that is it. Pt reports hospice can come out tomorrow and do their assessment but does not want DME delivered before then. [facility] notified. Medical necessity on chart. DNR on chart.     Final next level of care: Home w Hospice Care Barriers to Discharge: Barriers Resolved   Patient Goals and CMS Choice CMS Medicare.gov Compare Post Acute Care list provided to:: Patient Choice offered to / list presented to : Patient  Discharge Placement                  Patient to be transferred to facility by: [facility] Name of family member notified: myra Patient and family notified of of transfer: 08/22/23  Discharge Plan and Services Additional resources added to the After Visit Summary for                                       Social Determinants of Health (SDOH) Interventions SDOH Screenings   Food Insecurity: No Food Insecurity (08/19/2023)  Housing: Low Risk  (08/19/2023)  Transportation Needs: No Transportation Needs (08/19/2023)  Utilities: Not At Risk (08/19/2023)  Alcohol Screen: Low Risk  (04/20/2022)  Depression (PHQ2-9): Low Risk  (12/01/2022)  Tobacco Use: Medium Risk (08/19/2023)     Readmission Risk Interventions     No data to display

## 2023-08-22 NOTE — Progress Notes (Signed)
Patient being discharged home with Hospice. PIV removed. Discharge instructions and papers given to patient. Patient will be transported via EMS.

## 2023-08-22 NOTE — Care Management Important Message (Signed)
Important Message  Patient Details  Name: Monica [name] MRN: [id number] Date of Birth: 01/15/1926   Important Message Given:  Other (see comment)  Patient is on comfort care with plan to return to [facility] with hospice. Out of respect for the patient and family no Important Message from [facility] given.   [name] A Mosella Kasa 08/22/2023, 7:56 AM

## 2023-08-22 NOTE — Discharge Summary (Signed)
Physician Discharge Summary   Patient: Monica [name] MRN: [id number] DOB: 1926-02-14  Admit date:     08/19/2023  Discharge date: 08/22/23  Discharge Physician: Pennie Banter   PCP: Sallyanne Kuster, NP   Recommendations at discharge:    Follow up with Hospice agency upon return to [facility]  Discharge Diagnoses: Principal Problem:   End of life care Active Problems:   Restless legs syndrome   Pure hypercholesterolemia   H/O deep venous thrombosis   Essential hypertension   Iron deficiency anemia due to chronic blood loss   Chronic obstructive pulmonary disease (HCC)   Fracture of femoral neck, right (HCC)   Diabetes mellitus without complication (HCC)   Choledocholithiasis   Severe sepsis with acute organ dysfunction (HCC)   Septic shock (HCC)   History of tobacco use  Resolved Problems:   * No resolved hospital problems. [facility] Course: HPI on admission 08/19/2023: "Monica [name] is a 89 year old female with history of tobacco use, quit more than 20 years ago, hypertension, history of DVT on Eliquis, non-insulin-dependent diabetes mellitus, hyperlipidemia, who presents to the emergency department late evening of 08/18/2023, from [facility] for chief concerns of epigastric pain while eating dinner.  Initial vitals in the ED showed temperature of 97.9, respiration rate of 16, heart rate of 52, blood pressure 182/56, SpO2 of 96% on room air.  Throughout ED course, patient developed severe sepsis with septic shock, Tmax of 101.4, blood pressure decreasing to 95/38, MAP of 56.  Patient was initially started on vasopressor, Levophed.  Right upper quadrant abdominal ultrasound said 3 mm gallbladder polyp per consensus statement, gallbladder polyps less than 6 mm usually benign not requiring follow-up.  Dilated common bile duct to 12 mm.  MRCP was ordered and read as positive for obstructive choledocholithiasis, 8 mm stone in the distal common bile duct at the level of  the ampulla with severe common bile duct dilatation and moderate intrahepatic biliary ductal dilatation.  Gallbladder severely distended with some gallbladder wall edema.  Findings are favored to reflect distal biliary tract obstruction rather than acute cholecystitis.  Hepatic steatosis.  Colonic diverticulosis.  ED treatments include Haldol 2 mg IV, fentanyl 25 mcg, Toradol 15 mg, Ativan, ondansetron 4 mg IV, ceftriaxone 1 g IV, aztreonam, Flagyl, sodium chloride bolus totaling 2.25 L.  Levophed gtt.  ERCP was not available at [facility].  [facility], [facility] were called and declined patient due to no bed availability.  [facility] was called for transfer initiation.  Ultimately family decided on comfort measures with palliative care admission.  Transfer process was discontinued by ED."   Patient admitted for comfort measures / end of life care.  Hospice liaison notified and following.   08/22/23  --- pt medically stable and free of pain.  She denies complaints, just wants to go home. Pt is stable for transfer back to [facility] with hospice to follow.   Assessment and Plan:  * End of life care Patient presenting with apparent failure to thrive with possible sepsis, etiology was not worked up due to patient's family requesting comfort. --Hospice to follow at [facility] --Comfort prescriptions sent including morphine, ativan, haldol, robinul, zofran --Only resumed home meds that provide comfort (meloxicam, Amitiza)  History of tobacco use Per family, patient quit more than 20 years ago  Septic shock (HCC) Family did not want aggressive IV fluid, required vasopressor, Levophed  Severe sepsis with acute organ dysfunction (HCC) See H&P for details. Comfort care measures  only.  Choledocholithiasis .  Diabetes mellitus without complication (HCC) .  Fracture of femoral neck, right (HCC) .  Chronic obstructive pulmonary disease (HCC) .  Iron deficiency anemia due to chronic blood  loss .  Essential hypertension .  H/O deep venous thrombosis .  Pure hypercholesterolemia .  Restless legs syndrome .         Consultants: Hospice Liaison Procedures performed: None  Disposition: Assisted living  Diet recommendation:  Discharge Diet Orders (From admission, onward)     Start     Ordered   08/22/23 0000  Diet - regular / for comfort       08/22/23 1337            DISCHARGE MEDICATION: Allergies as of 08/22/2023       Reactions   Sulfa Antibiotics Swelling   Celecoxib Nausea And Vomiting   Acetaminophen Itching   Codeine Rash   Lyrica [pregabalin] Rash   Penicillin G Rash   Has patient had a PCN reaction causing immediate rash, facial/tongue/throat swelling, SOB or lightheadedness with hypotension: Yes Has patient had a PCN reaction causing severe rash involving mucus membranes or skin necrosis: No Has patient had a PCN reaction that required hospitalization: No Has patient had a PCN reaction occurring within the last 10 years: Unknown If all of the above answers are "NO", then may proceed with Cephalosporin use.   Petrolatum-zinc Oxide Rash        Medication List     STOP taking these medications    amLODipine-benazepril 10-20 MG capsule Commonly known as: LOTREL   apixaban 2.5 MG Tabs tablet Commonly known as: Eliquis   feeding supplement Liqd   gabapentin 300 MG capsule Commonly known as: NEURONTIN   metFORMIN 500 MG 24 hr tablet Commonly known as: GLUCOPHAGE-XR   montelukast 10 MG tablet Commonly known as: SINGULAIR   mupirocin ointment 2 % Commonly known as: Charity fundraiser 30G Misc   OneTouch Ultra test strip Generic drug: glucose blood   oxybutynin 10 MG 24 hr tablet Commonly known as: DITROPAN-XL   Theo-24 100 MG 24 hr capsule Generic drug: theophylline       TAKE these medications    albuterol 108 (90 Base) MCG/ACT inhaler Commonly known as: VENTOLIN HFA INHALE TWO PUFFS INTO  THE LUNGS EVERY 4 HOURS AS NEEDED FOR WHEEZING OR SHORTNESS OF BREATH   antiseptic oral rinse Liqd 15 mLs by Mouth Rinse route as needed for dry mouth.   clotrimazole-betamethasone cream Commonly known as: LOTRISONE Apply 1 Application topically 2 (two) times daily.   glycopyrrolate 1 MG tablet Commonly known as: ROBINUL Take 1 tablet (1 mg total) by mouth every 4 (four) hours as needed (excessive secretions).   haloperidol 1 MG tablet Commonly known as: HALDOL Take 1 tablet (1 mg total) by mouth every 8 (eight) hours as needed for agitation.   LORazepam 0.5 MG tablet Commonly known as: Ativan Take 1 tablet (0.5 mg total) by mouth every 8 (eight) hours as needed for anxiety.   lubiprostone 8 MCG capsule Commonly known as: AMITIZA Take 1 capsule (8 mcg total) by mouth 2 (two) times daily with a meal.   meloxicam 7.5 MG tablet Commonly known as: MOBIC Take 1 tablet (7.5 mg total) by mouth daily.   morphine 20 MG/5ML solution Take 1.3-2.5 mLs (5.2-10 mg total) by mouth every 2 (two) hours as needed (pain or air hunger).   nystatin powder Generic drug: nystatin APPLY 1 APPLICATION TOPICALLY  3 TIMES DAILY TO GROIN AREA UNTIL RASH RESOLVES   ondansetron 4 MG disintegrating tablet Commonly known as: ZOFRAN-ODT Take 1 tablet (4 mg total) by mouth every 6 (six) hours as needed for nausea.   polyvinyl alcohol 1.4 % ophthalmic solution Commonly known as: LIQUIFILM TEARS Place 1 drop into both eyes 4 (four) times daily as needed for dry eyes.        Discharge Exam: Filed Weights   08/18/23 2212  Weight: 57 kg   General exam: awake, alert, no acute distress HEENT: atraumatic, clear conjunctiva, anicteric sclera, oist mucus membranes, hearing grossly normal  Respiratory system: CTAB, no wheezes, rales or rhonchi, normal respiratory effort. Cardiovascular system: normal S1/S2, RRR, no pedal edema.   Gastrointestinal system: soft, NT, ND, no HSM felt, +bowel sounds. Central  nervous system: A&O x2+. no gross focal neurologic deficits, normal speech Extremities: moves all, no edema, normal tone Skin: dry, intact, normal temperature Psychiatry: irritable mood, congruent affect   Condition at discharge: stable  The results of significant diagnostics from this hospitalization (including imaging, microbiology, ancillary and laboratory) are listed below for reference.   Imaging Studies: MR 3D Recon At Scanner  Result Date: 08/20/2023 CLINICAL DATA:  89 year old female with history of right upper quadrant abdominal pain. Dilated common bile duct on ultrasound examination. Evaluate for biliary tract obstruction. EXAM: MRI ABDOMEN WITHOUT CONTRAST  (INCLUDING MRCP) TECHNIQUE: Multiplanar multisequence MR imaging of the abdomen was performed. Heavily T2-weighted images of the biliary and pancreatic ducts were obtained, and three-dimensional MRCP images were rendered by post processing. COMPARISON:  None Available. FINDINGS: Comment: Today's study is limited for detection and characterization of visceral and/or vascular lesions by lack of IV gadolinium. Lower chest: Unremarkable. Hepatobiliary: No definite suspicious cystic or solid hepatic lesions are confidently identified on today's noncontrast examination. Diffuse loss of signal intensity throughout the hepatic parenchyma on out of phase dual echo images, indicative of a background of hepatic steatosis. Gallbladder is severely distended. No definite filling defects are noted within the lumen of the gallbladder to suggest gallstones. Gallbladder wall appears mildly edematous. Moderate intrahepatic biliary ductal dilatation. Severe dilatation of the common bile duct measuring up to 15 mm in the distal common bile duct. 8 mm filling defect at the level of the ampulla, indicative of choledocholithiasis (coronal image 26 of series 3). Pancreas: No definite pancreatic mass or peripancreatic fluid collections or inflammatory changes  noted on today's noncontrast examination. No pancreatic ductal dilatation. Spleen:  Unremarkable. Adrenals/Urinary Tract: Right kidney and bilateral adrenal glands are unremarkable in appearance. 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 T1 hypointense, T2 hyperintense lesions in the left kidney, incompletely characterized on today's noncontrast examination, but statistically likely cysts (no imaging follow-up recommended), largest of which is exophytic extending off the lower pole measuring up to 3.4 cm in diameter. Stomach/Bowel: Colonic diverticulosis.  Otherwise, unremarkable. Vascular/Lymphatic: No aneurysm identified in the visualized abdominal vasculature. No lymphadenopathy noted in the abdomen. Other: No significant volume of ascites noted in the visualized portions of the peritoneal cavity. Musculoskeletal: No aggressive appearing osseous lesions are noted in the visualized portions of the skeleton. IMPRESSION: 1. Study is positive for obstructive choledocholithiasis with an 8 mm stone in the distal common bile duct at the level of the ampulla with severe common bile duct dilatation and moderate intrahepatic biliary ductal dilatation. 2. Gallbladder is also severely  distended with some gallbladder wall edema. Given the lack of sonographic Murphy's sign on contemporaneously obtained ultrasound examination, findings are favored to reflect distal biliary tract obstruction rather than acute cholecystitis. 3. Hepatic steatosis. 4. Colonic diverticulosis. 5. Additional incidental findings, as above. Electronically Signed   By: Trudie Reed M.D.   On: 08/20/2023 08:18   MR ABDOMEN MRCP WO CONTRAST  Result Date: 08/19/2023 CLINICAL DATA:  89 year old female with history of right upper quadrant abdominal pain. Dilated common bile duct on ultrasound  examination. Evaluate for biliary tract obstruction. EXAM: MRI ABDOMEN WITHOUT CONTRAST  (INCLUDING MRCP) TECHNIQUE: Multiplanar multisequence MR imaging of the abdomen was performed. Heavily T2-weighted images of the biliary and pancreatic ducts were obtained, and three-dimensional MRCP images were rendered by post processing. COMPARISON:  None Available. FINDINGS: Comment: Today's study is limited for detection and characterization of visceral and/or vascular lesions by lack of IV gadolinium. Lower chest: Unremarkable. Hepatobiliary: No definite suspicious cystic or solid hepatic lesions are confidently identified on today's noncontrast examination. Diffuse loss of signal intensity throughout the hepatic parenchyma on out of phase dual echo images, indicative of a background of hepatic steatosis. Gallbladder is severely distended. No definite filling defects are noted within the lumen of the gallbladder to suggest gallstones. Gallbladder wall appears mildly edematous. Moderate intrahepatic biliary ductal dilatation. Severe dilatation of the common bile duct measuring up to 15 mm in the distal common bile duct. 8 mm filling defect at the level of the ampulla, indicative of choledocholithiasis (coronal image 26 of series 3). Pancreas: No definite pancreatic mass or peripancreatic fluid collections or inflammatory changes noted on today's noncontrast examination. No pancreatic ductal dilatation. Spleen:  Unremarkable. Adrenals/Urinary Tract: Right kidney and bilateral adrenal glands are unremarkable in appearance. 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 T1 hypointense, T2 hyperintense lesions in the left kidney, incompletely characterized on today's noncontrast examination, but statistically likely cysts (no imaging follow-up recommended), largest of which is  exophytic extending off the lower pole measuring up to 3.4 cm in diameter. Stomach/Bowel: Colonic diverticulosis.  Otherwise, unremarkable. Vascular/Lymphatic: No aneurysm identified in the visualized abdominal vasculature. No lymphadenopathy noted in the abdomen. Other: No significant volume of ascites noted in the visualized portions of the peritoneal cavity. Musculoskeletal: No aggressive appearing osseous lesions are noted in the visualized portions of the skeleton. IMPRESSION: 1. Study is positive for obstructive choledocholithiasis with an 8 mm stone in the distal common bile duct at the level of the ampulla with severe common bile duct dilatation and moderate intrahepatic biliary ductal dilatation. 2. Gallbladder is also severely distended with some gallbladder wall edema. Given the lack of sonographic Murphy's sign on contemporaneously obtained ultrasound examination, findings are favored to reflect distal biliary tract obstruction rather than acute cholecystitis. 3. Hepatic steatosis. 4. Colonic diverticulosis. 5. Additional incidental findings, as above. Electronically Signed   By: Trudie Reed M.D.   On: 08/19/2023 06:49   US ABDOMEN LIMITED RUQ (LIVER/GB)  Result Date: 08/19/2023 CLINICAL DATA:  Right upper quadrant pain EXAM: ULTRASOUND ABDOMEN LIMITED RIGHT UPPER QUADRANT COMPARISON:  None Available. FINDINGS: Gallbladder: Gallbladder is well distended. Single 3 mm polyp is noted. No gallstones are noted. Common bile duct: Diameter: 12.5 mm. Liver: No focal lesion identified. Within normal limits in parenchymal echogenicity. Portal vein is patent on color Doppler imaging with normal direction of blood flow towards the liver.  Other: None. IMPRESSION: 3 mm gallbladder polyp Per consensus statement, gallbladder polyps less than 6 mm are usually benign not requiring follow-up Dilated common bile duct to 12 mm. Correlate with laboratory values. Electronically Signed   By: Alcide Clever M.D.   On:  08/19/2023 02:05   DG Chest 2 View  Result Date: 08/18/2023 CLINICAL DATA:  Chest pain, epigastric pain, and nausea today. EXAM: CHEST - 2 VIEW COMPARISON:  02/25/2023 FINDINGS: Heart size and pulmonary vascularity are normal. Emphysematous changes in the lungs. Fibrosis or atelectasis in the lung bases, greater on the left and unchanged since prior study. No focal consolidation. No pleural effusions or pneumothorax. Calcified and tortuous aorta. Degenerative changes in the spine and shoulders. IMPRESSION: Infiltration or atelectasis in the lung bases. Emphysematous changes. No focal consolidation. Electronically Signed   By: Burman Nieves M.D.   On: 08/18/2023 22:38    Microbiology: Results for orders placed or performed during the hospital encounter of 08/19/23  Urine Culture     Status: Abnormal   Collection Time: 08/19/23  1:23 AM   Specimen: Urine, Clean Catch  Result Value Ref Range Status   Specimen Description   Final    URINE, CLEAN CATCH Performed at [facility], [address], [place], [place] [address]    Special Requests   Final    NONE Performed at [facility], [address], [place], [place] [address]    Culture (A)  Final    >=100,000 COLONIES/mL ESCHERICHIA COLI Two isolates with different morphologies were identified as the same organism.The most resistant organism was reported. Performed at [facility], [address] [place], [place] [address]    Report Status 08/22/2023 FINAL  Final   Organism ID, Bacteria ESCHERICHIA COLI (A)  Final      Susceptibility   Escherichia coli - MIC*    AMPICILLIN >=32 RESISTANT Resistant     CEFAZOLIN 32 INTERMEDIATE Intermediate     CEFEPIME <=0.12 SENSITIVE Sensitive     CEFTRIAXONE <=0.25 SENSITIVE Sensitive     CIPROFLOXACIN >=4 RESISTANT Resistant     GENTAMICIN <=1 SENSITIVE Sensitive     IMIPENEM <=0.25 SENSITIVE Sensitive     NITROFURANTOIN <=16 SENSITIVE Sensitive     TRIMETH/SULFA  >=320 RESISTANT Resistant     AMPICILLIN/SULBACTAM >=32 RESISTANT Resistant     PIP/TAZO 64 INTERMEDIATE Intermediate     * >=100,000 COLONIES/mL ESCHERICHIA COLI  Culture, blood (routine x 2)     Status: None (Preliminary result)   Collection Time: 08/19/23  7:11 AM   Specimen: BLOOD  Result Value Ref Range Status   Specimen Description BLOOD RIGHT ANTECUBITAL  Final   Special Requests   Final    BOTTLES DRAWN AEROBIC AND ANAEROBIC Blood Culture results may not be optimal due to an excessive volume of blood received in culture bottles   Culture   Final    NO GROWTH 2 DAYS Performed at [facility], [address] [place], [place] [address]    Report Status PENDING  Incomplete  Culture, blood (routine x 2)     Status: None (Preliminary result)   Collection Time: 08/19/23  7:11 AM   Specimen: BLOOD RIGHT HAND  Result Value Ref Range Status   Specimen Description BLOOD RIGHT HAND  Final   Special Requests   Final    BOTTLES DRAWN AEROBIC AND ANAEROBIC Blood Culture adequate volume   Culture   Final    NO GROWTH 2 DAYS Performed at  [facility], [address] [place], [place] [address]    Report Status PENDING  Incomplete    Labs: CBC: Recent Labs  Lab 08/18/23 2217  WBC 11.1*  HGB 11.0*  HCT 34.2*  MCV 80.1  PLT 245   Basic Metabolic Panel: Recent Labs  Lab 08/18/23 2217  NA 130*  K 3.7  CL 95*  CO2 26  GLUCOSE 181*  BUN 22  CREATININE 0.87  CALCIUM 9.2   Liver Function Tests: Recent Labs  Lab 08/18/23 2217  AST 110*  ALT 62*  ALKPHOS 66  BILITOT 1.1  PROT 7.1  ALBUMIN 3.8   CBG: No results for input(s): "GLUCAP" in the last 168 hours.  Discharge time spent: greater than 30 minutes.  Signed: Pennie Banter, DO [facility] 08/22/2023

## 2023-08-23 ENCOUNTER — Telehealth: Payer: Self-pay

## 2023-08-23 ENCOUNTER — Ambulatory Visit: Payer: BLUE CROSS/BLUE SHIELD | Admitting: Nurse Practitioner

## 2023-08-23 DIAGNOSIS — K831 Obstruction of bile duct: Secondary | ICD-10-CM | POA: Diagnosis not present

## 2023-08-23 DIAGNOSIS — K760 Fatty (change of) liver, not elsewhere classified: Secondary | ICD-10-CM | POA: Diagnosis not present

## 2023-08-23 DIAGNOSIS — K8041 Calculus of bile duct with cholecystitis, unspecified, with obstruction: Secondary | ICD-10-CM | POA: Diagnosis not present

## 2023-08-23 DIAGNOSIS — R634 Abnormal weight loss: Secondary | ICD-10-CM | POA: Diagnosis not present

## 2023-08-23 DIAGNOSIS — H04123 Dry eye syndrome of bilateral lacrimal glands: Secondary | ICD-10-CM | POA: Diagnosis not present

## 2023-08-23 DIAGNOSIS — D135 Benign neoplasm of extrahepatic bile ducts: Secondary | ICD-10-CM | POA: Diagnosis not present

## 2023-08-23 DIAGNOSIS — K828 Other specified diseases of gallbladder: Secondary | ICD-10-CM | POA: Diagnosis not present

## 2023-08-23 DIAGNOSIS — K839 Disease of biliary tract, unspecified: Secondary | ICD-10-CM | POA: Diagnosis not present

## 2023-08-23 DIAGNOSIS — K838 Other specified diseases of biliary tract: Secondary | ICD-10-CM | POA: Diagnosis not present

## 2023-08-23 DIAGNOSIS — K5730 Diverticulosis of large intestine without perforation or abscess without bleeding: Secondary | ICD-10-CM | POA: Diagnosis not present

## 2023-08-23 DIAGNOSIS — I10 Essential (primary) hypertension: Secondary | ICD-10-CM | POA: Diagnosis not present

## 2023-08-23 DIAGNOSIS — R6521 Severe sepsis with septic shock: Secondary | ICD-10-CM | POA: Diagnosis not present

## 2023-08-23 NOTE — Telephone Encounter (Signed)
[facility] hosp[facility] called starr [phone]  that pt is going home with hospice referral from hospital  and as per alyssa gave verbal order for the to be her hospice provider for further treatment

## 2023-08-25 DIAGNOSIS — H04123 Dry eye syndrome of bilateral lacrimal glands: Secondary | ICD-10-CM | POA: Diagnosis not present

## 2023-08-25 DIAGNOSIS — I10 Essential (primary) hypertension: Secondary | ICD-10-CM | POA: Diagnosis not present

## 2023-08-25 DIAGNOSIS — R634 Abnormal weight loss: Secondary | ICD-10-CM | POA: Diagnosis not present

## 2023-08-25 DIAGNOSIS — K5730 Diverticulosis of large intestine without perforation or abscess without bleeding: Secondary | ICD-10-CM | POA: Diagnosis not present

## 2023-08-25 DIAGNOSIS — K831 Obstruction of bile duct: Secondary | ICD-10-CM | POA: Diagnosis not present

## 2023-08-25 DIAGNOSIS — D135 Benign neoplasm of extrahepatic bile ducts: Secondary | ICD-10-CM | POA: Diagnosis not present

## 2023-08-25 LAB — CULTURE, BLOOD (ROUTINE X 2)
Culture: NO GROWTH
Culture: NO GROWTH
Special Requests: ADEQUATE

## 2023-08-26 ENCOUNTER — Telehealth: Payer: Self-pay | Admitting: Nurse Practitioner

## 2023-08-26 DIAGNOSIS — K831 Obstruction of bile duct: Secondary | ICD-10-CM | POA: Diagnosis not present

## 2023-08-26 DIAGNOSIS — K5730 Diverticulosis of large intestine without perforation or abscess without bleeding: Secondary | ICD-10-CM | POA: Diagnosis not present

## 2023-08-26 DIAGNOSIS — R634 Abnormal weight loss: Secondary | ICD-10-CM | POA: Diagnosis not present

## 2023-08-26 DIAGNOSIS — H04123 Dry eye syndrome of bilateral lacrimal glands: Secondary | ICD-10-CM | POA: Diagnosis not present

## 2023-08-26 DIAGNOSIS — D135 Benign neoplasm of extrahepatic bile ducts: Secondary | ICD-10-CM | POA: Diagnosis not present

## 2023-08-26 DIAGNOSIS — I10 Essential (primary) hypertension: Secondary | ICD-10-CM | POA: Diagnosis not present

## 2023-08-26 NOTE — Telephone Encounter (Signed)
08/23/23 Plan of Care signed. Faxed back to [facility]; [phone]. Scanned-Toni

## 2023-08-29 ENCOUNTER — Encounter: Payer: Self-pay | Admitting: Nurse Practitioner

## 2023-08-30 DIAGNOSIS — K831 Obstruction of bile duct: Secondary | ICD-10-CM | POA: Diagnosis not present

## 2023-08-30 DIAGNOSIS — H04123 Dry eye syndrome of bilateral lacrimal glands: Secondary | ICD-10-CM | POA: Diagnosis not present

## 2023-08-30 DIAGNOSIS — I10 Essential (primary) hypertension: Secondary | ICD-10-CM | POA: Diagnosis not present

## 2023-08-30 DIAGNOSIS — R634 Abnormal weight loss: Secondary | ICD-10-CM | POA: Diagnosis not present

## 2023-08-30 DIAGNOSIS — D135 Benign neoplasm of extrahepatic bile ducts: Secondary | ICD-10-CM | POA: Diagnosis not present

## 2023-08-30 DIAGNOSIS — K5730 Diverticulosis of large intestine without perforation or abscess without bleeding: Secondary | ICD-10-CM | POA: Diagnosis not present

## 2023-08-31 DIAGNOSIS — K831 Obstruction of bile duct: Secondary | ICD-10-CM | POA: Diagnosis not present

## 2023-08-31 DIAGNOSIS — K5730 Diverticulosis of large intestine without perforation or abscess without bleeding: Secondary | ICD-10-CM | POA: Diagnosis not present

## 2023-08-31 DIAGNOSIS — R634 Abnormal weight loss: Secondary | ICD-10-CM | POA: Diagnosis not present

## 2023-08-31 DIAGNOSIS — H04123 Dry eye syndrome of bilateral lacrimal glands: Secondary | ICD-10-CM | POA: Diagnosis not present

## 2023-08-31 DIAGNOSIS — D135 Benign neoplasm of extrahepatic bile ducts: Secondary | ICD-10-CM | POA: Diagnosis not present

## 2023-08-31 DIAGNOSIS — I10 Essential (primary) hypertension: Secondary | ICD-10-CM | POA: Diagnosis not present

## 2023-09-02 DIAGNOSIS — R634 Abnormal weight loss: Secondary | ICD-10-CM | POA: Diagnosis not present

## 2023-09-02 DIAGNOSIS — K831 Obstruction of bile duct: Secondary | ICD-10-CM | POA: Diagnosis not present

## 2023-09-02 DIAGNOSIS — D135 Benign neoplasm of extrahepatic bile ducts: Secondary | ICD-10-CM | POA: Diagnosis not present

## 2023-09-02 DIAGNOSIS — I10 Essential (primary) hypertension: Secondary | ICD-10-CM | POA: Diagnosis not present

## 2023-09-02 DIAGNOSIS — H04123 Dry eye syndrome of bilateral lacrimal glands: Secondary | ICD-10-CM | POA: Diagnosis not present

## 2023-09-02 DIAGNOSIS — K5730 Diverticulosis of large intestine without perforation or abscess without bleeding: Secondary | ICD-10-CM | POA: Diagnosis not present

## 2023-09-06 ENCOUNTER — Emergency Department: Payer: Medicare Other

## 2023-09-06 ENCOUNTER — Emergency Department
Admission: EM | Admit: 2023-09-06 | Discharge: 2023-09-06 | Disposition: A | Discharge status: Home / Self Care | Payer: Medicare Other | Attending: Emergency Medicine | Admitting: Emergency Medicine

## 2023-09-06 ENCOUNTER — Other Ambulatory Visit: Payer: Self-pay

## 2023-09-06 DIAGNOSIS — S0181XA Laceration without foreign body of other part of head, initial encounter: Secondary | ICD-10-CM | POA: Diagnosis not present

## 2023-09-06 DIAGNOSIS — W1839XA Other fall on same level, initial encounter: Secondary | ICD-10-CM | POA: Diagnosis not present

## 2023-09-06 DIAGNOSIS — S199XXA Unspecified injury of neck, initial encounter: Secondary | ICD-10-CM | POA: Diagnosis not present

## 2023-09-06 DIAGNOSIS — R0789 Other chest pain: Secondary | ICD-10-CM | POA: Diagnosis not present

## 2023-09-06 DIAGNOSIS — I10 Essential (primary) hypertension: Secondary | ICD-10-CM | POA: Insufficient documentation

## 2023-09-06 DIAGNOSIS — R000 Tachycardia, unspecified: Secondary | ICD-10-CM | POA: Diagnosis not present

## 2023-09-06 DIAGNOSIS — M25519 Pain in unspecified shoulder: Secondary | ICD-10-CM | POA: Diagnosis not present

## 2023-09-06 DIAGNOSIS — E119 Type 2 diabetes mellitus without complications: Secondary | ICD-10-CM | POA: Insufficient documentation

## 2023-09-06 DIAGNOSIS — R29898 Other symptoms and signs involving the musculoskeletal system: Secondary | ICD-10-CM | POA: Diagnosis not present

## 2023-09-06 DIAGNOSIS — S0011XA Contusion of right eyelid and periocular area, initial encounter: Secondary | ICD-10-CM | POA: Insufficient documentation

## 2023-09-06 DIAGNOSIS — S01111A Laceration without foreign body of right eyelid and periocular area, initial encounter: Secondary | ICD-10-CM | POA: Diagnosis not present

## 2023-09-06 DIAGNOSIS — Z7401 Bed confinement status: Secondary | ICD-10-CM | POA: Diagnosis not present

## 2023-09-06 DIAGNOSIS — I700 Atherosclerosis of aorta: Secondary | ICD-10-CM | POA: Diagnosis not present

## 2023-09-06 DIAGNOSIS — S0511XA Contusion of eyeball and orbital tissues, right eye, initial encounter: Secondary | ICD-10-CM | POA: Diagnosis not present

## 2023-09-06 DIAGNOSIS — I6782 Cerebral ischemia: Secondary | ICD-10-CM | POA: Diagnosis not present

## 2023-09-06 DIAGNOSIS — J449 Chronic obstructive pulmonary disease, unspecified: Secondary | ICD-10-CM | POA: Insufficient documentation

## 2023-09-06 DIAGNOSIS — W19XXXA Unspecified fall, initial encounter: Secondary | ICD-10-CM

## 2023-09-06 DIAGNOSIS — R911 Solitary pulmonary nodule: Secondary | ICD-10-CM | POA: Diagnosis not present

## 2023-09-06 DIAGNOSIS — M25511 Pain in right shoulder: Secondary | ICD-10-CM | POA: Diagnosis not present

## 2023-09-06 DIAGNOSIS — S0990XA Unspecified injury of head, initial encounter: Secondary | ICD-10-CM | POA: Diagnosis not present

## 2023-09-06 DIAGNOSIS — S0993XA Unspecified injury of face, initial encounter: Secondary | ICD-10-CM | POA: Diagnosis present

## 2023-09-06 DIAGNOSIS — M19011 Primary osteoarthritis, right shoulder: Secondary | ICD-10-CM | POA: Diagnosis not present

## 2023-09-06 LAB — COMPREHENSIVE METABOLIC PANEL
ALT: 20 U/L (ref 0–44)
AST: 28 U/L (ref 15–41)
Albumin: 3.6 g/dL (ref 3.5–5.0)
Alkaline Phosphatase: 89 U/L (ref 38–126)
Anion gap: 11 (ref 5–15)
BUN: 13 mg/dL (ref 8–23)
CO2: 24 mmol/L (ref 22–32)
Calcium: 9.6 mg/dL (ref 8.9–10.3)
Chloride: 101 mmol/L (ref 98–111)
Creatinine, Ser: 0.7 mg/dL (ref 0.44–1.00)
GFR, Estimated: >60 mL/min (ref >60)
Glucose, Bld: 176 mg/dL — ABNORMAL HIGH (ref 70–99)
Potassium: 3.6 mmol/L (ref 3.5–5.1)
Sodium: 136 mmol/L (ref 135–145)
Total Bilirubin: 1.1 mg/dL (ref 0.3–1.2)
Total Protein: 6.8 g/dL (ref 6.5–8.1)

## 2023-09-06 LAB — CBC
HCT: 35.3 % — ABNORMAL LOW (ref 36.0–46.0)
Hemoglobin: 11 g/dL — ABNORMAL LOW (ref 12.0–15.0)
MCH: 25.8 pg — ABNORMAL LOW (ref 26.0–34.0)
MCHC: 31.2 g/dL (ref 30.0–36.0)
MCV: 82.9 fL (ref 80.0–100.0)
Platelets: 276 10*3/uL (ref 150–400)
RBC: 4.26 MIL/uL (ref 3.87–5.11)
RDW: 14.4 % (ref 11.5–15.5)
WBC: 6.5 10*3/uL (ref 4.0–10.5)
nRBC: 0 % (ref 0.0–0.2)

## 2023-09-06 MED ORDER — LABETALOL HCL 5 MG/ML IV SOLN
10.0000 mg | Freq: Once | INTRAVENOUS | Status: DC
  Filled 2023-09-06: qty 4

## 2023-09-06 NOTE — ED Notes (Signed)
Daughter guardian susan notified patient was being transported back to her home

## 2023-09-06 NOTE — ED Provider Notes (Signed)
[facility] Provider Note    Event Date/Time   First MD Initiated Contact with Patient 09/06/23 1503     (approximate)   History   Fall   HPI Monica [name] is a 89 y.o. female with history of A-fib, COPD, DM2, HTN, HLD presenting today for fall.  Patient is currently on hospice and living in an independent living facility.  Reportedly had a fall last night but unsure what happened.  Patient does not remember the fall.  Noted bruising to the right eyebrow and eyelid with skin tear to the right cheek.  She notes some pain when palpating across her chest and her shoulders.  Otherwise denies pain elsewhere.  Chart review: Patient recently admitted to the hospital for choledocholithiasis with sepsis.  While in the ED, unable to be transferred due to no bed availability for ERCP treatment to [facility], [facility], or [facility].  Family ultimately elected given her age and sepsis, that they would like to proceed with hospice care and comfort care measures at this time.  Patient was admitted to hospitalist service.  Patient was discharged on 08/22/2023 under hospice care.     Physical Exam   Triage Vital Signs: ED Triage Vitals  Encounter Vitals Group     BP 09/06/23 1327 (!) 164/91     Systolic BP Percentile --      Diastolic BP Percentile --      Pulse Rate 09/06/23 1327 (!) 107     Resp 09/06/23 1327 18     Temp 09/06/23 1324 97.7 F (36.5 C)     Temp src --      SpO2 09/06/23 1327 96 %     Weight 09/06/23 1327 125 lb 10.6 oz (57 kg)     Height 09/06/23 1327 5\' 4"  (1.626 m)     Head Circumference --      Peak Flow --      Pain Score 09/06/23 1327 5     Pain Loc --      Pain Education --      Exclude from Growth Chart --     Most recent vital signs: Vitals:   09/06/23 1700 09/06/23 2000  BP: (!) 223/69 (!) 151/69  Pulse: 92   Resp:    Temp:    SpO2: 100%    Physical Exam: I have reviewed the vital signs and nursing notes. General: Awake, alert, no acute  distress.  Nontoxic appearing. Head: Hematoma to right eyebrow with ecchymosis to the right eyelid., normocephalic.  Small abrasion to the right cheek measuring approximately 2 cm. ENT:  EOM intact, PERRL.  No prior ptosis.  Oral mucosa is pink and moist with no lesions. Neck: Neck is supple with full range of motion, No meningeal signs.  No tenderness to C-spine. Cardiovascular:  RRR, No murmurs. Peripheral pulses palpable and equal bilaterally. Respiratory:  Symmetrical chest wall expansion.  No rhonchi, rales, or wheezes.  Good air movement throughout.  No use of accessory muscles.   Musculoskeletal:  No cyanosis or edema. Moving extremities with full ROM.  No tenderness palpation to T or L-spine.  Slight tenderness palpation across the frontal chest wall and right shoulder. Abdomen:  Soft, nontender, nondistended. Neuro:  GCS 15, moving all four extremities, interacting appropriately. Speech clear. Psych:  Calm, appropriate.   Skin:  Warm, dry, no rash.    ED Results / Procedures / Treatments   Labs (all labs ordered are listed, but only abnormal results are  displayed) Labs Reviewed  CBC - Abnormal; Notable for the following components:      Result Value   Hemoglobin 11.0 (*)    HCT 35.3 (*)    MCH 25.8 (*)    All other components within normal limits  COMPREHENSIVE METABOLIC PANEL - Abnormal; Notable for the following components:   Glucose, Bld 176 (*)    All other components within normal limits     EKG    RADIOLOGY Independently interpreted CT imaging with no acute pathology   PROCEDURES:  Critical Care performed: No  ..Laceration Repair  Date/Time: 09/06/2023 8:34 PM  Performed by: Janith Lima, MD Authorized by: Janith Lima, MD   Consent:    Consent obtained:  Verbal   Consent given by:  Patient   Risks, benefits, and alternatives were discussed: yes     Risks discussed:  Infection, pain, poor cosmetic result and poor wound healing   Alternatives  discussed:  No treatment Universal protocol:    Patient identity confirmed:  Arm band Laceration details:    Location:  Face   Face location:  R cheek   Length (cm):  2   Depth (mm):  1 Pre-procedure details:    Preparation:  Patient was prepped and draped in usual sterile fashion Exploration:    Limited defect created (wound extended): no     Hemostasis achieved with:  Direct pressure   Imaging outcome: foreign body not noted   Treatment:    Area cleansed with:  Chlorhexidine   Irrigation solution:  Sterile saline   Debridement:  None   Undermining:  None Skin repair:    Repair method:  Tissue adhesive Approximation:    Approximation:  Close Repair type:    Repair type:  Simple Post-procedure details:    Dressing:  Open (no dressing)   Procedure completion:  Tolerated well, no immediate complications    MEDICATIONS ORDERED IN ED: Medications  labetalol (NORMODYNE) injection 10 mg (10 mg Intravenous Not Given 09/06/23 2002)     IMPRESSION / MDM / ASSESSMENT AND PLAN / ED COURSE  I reviewed the triage vital signs and the nursing notes.                              Differential diagnosis includes, but is not limited to, fall, ICH, cervical spine injury, rib fractures, soft tissue hematoma.  Patient's presentation is most consistent with acute complicated illness / injury requiring diagnostic workup.  Patient is a 89 year old female presenting today for fall with trauma to the face.  She is a hospice patient although imaging was ordered initially on arrival in triage.  Laboratory workup unremarkable.  CT imaging of the head, cervical spine, and max face showed no acute traumatic pathology.  CT chest was ordered which shows no evidence of rib fractures.  Right shoulder x-ray shows no acute fractures either.  Attempted to call hospice given family's concern that patient may need increased level of care from her independent living facility.  They stated that they are not able  to specifically help with this and she would have to talk with her facility.  Patient is adamant that she does not want to be admitted to the hospital at this time and just wants to go home with no further treatment.  Family agrees that they do not want additional treatment at this time but would like to talk with a Child psychotherapist.  Attempted to talk  with Child psychotherapist but they are not available in the evening hours.  Given this, family would like to take patient home at this time and talk with hospice social worker.  I have also given our social worker a page so that she can call them tomorrow.  Family agreeable with this plan and given strict return precautions.  The patient is on the cardiac monitor to evaluate for evidence of arrhythmia and/or significant heart rate changes. Clinical Course as of 09/06/23 2032  Tue Sep 06, 2023  1616 No acute pathology of the head, C-spine, or maxillofacial. [DW]    Clinical Course User Index [DW] Janith Lima, MD     FINAL CLINICAL IMPRESSION(S) / ED DIAGNOSES   Final diagnoses:  Fall, initial encounter  Traumatic hematoma of right eyebrow, initial encounter     Rx / DC Orders   ED Discharge Orders     None        Note:  This document was prepared using Dragon voice recognition software and may include unintentional dictation errors.   Janith Lima, MD 09/06/23 2035

## 2023-09-06 NOTE — Discharge Instructions (Signed)
Please call social worker tomorrow for continued outpatient management.  We will have our social worker try to call you tomorrow as well to help with placement.

## 2023-09-06 NOTE — ED Triage Notes (Signed)
Pt to ED with daughter from [facility] for fall. Laceration to right cheek noted. No blood thinner. Daughter reports pt on hospice. Unsure what caused fall.  Larey Seat a week ago as well.  C/o right shoulder pain

## 2023-09-06 NOTE — ED Notes (Signed)
Left a message for social work for consult with the daughter of the pt.

## 2023-09-06 NOTE — Progress Notes (Addendum)
[facility] Liaison note:   This patient is a current [facility] patient.  [facility] will continue to follow for discharge disposition.    Please call for any Hospice  related questions or concerns.   [facility] Liaison [phone]

## 2023-09-07 DIAGNOSIS — K831 Obstruction of bile duct: Secondary | ICD-10-CM | POA: Diagnosis not present

## 2023-09-07 DIAGNOSIS — H04123 Dry eye syndrome of bilateral lacrimal glands: Secondary | ICD-10-CM | POA: Diagnosis not present

## 2023-09-07 DIAGNOSIS — K5730 Diverticulosis of large intestine without perforation or abscess without bleeding: Secondary | ICD-10-CM | POA: Diagnosis not present

## 2023-09-07 DIAGNOSIS — D135 Benign neoplasm of extrahepatic bile ducts: Secondary | ICD-10-CM | POA: Diagnosis not present

## 2023-09-07 DIAGNOSIS — I10 Essential (primary) hypertension: Secondary | ICD-10-CM | POA: Diagnosis not present

## 2023-09-07 DIAGNOSIS — R634 Abnormal weight loss: Secondary | ICD-10-CM | POA: Diagnosis not present

## 2023-09-08 DIAGNOSIS — D135 Benign neoplasm of extrahepatic bile ducts: Secondary | ICD-10-CM | POA: Diagnosis not present

## 2023-09-08 DIAGNOSIS — K831 Obstruction of bile duct: Secondary | ICD-10-CM | POA: Diagnosis not present

## 2023-09-08 DIAGNOSIS — R634 Abnormal weight loss: Secondary | ICD-10-CM | POA: Diagnosis not present

## 2023-09-08 DIAGNOSIS — K5730 Diverticulosis of large intestine without perforation or abscess without bleeding: Secondary | ICD-10-CM | POA: Diagnosis not present

## 2023-09-08 DIAGNOSIS — I10 Essential (primary) hypertension: Secondary | ICD-10-CM | POA: Diagnosis not present

## 2023-09-08 DIAGNOSIS — H04123 Dry eye syndrome of bilateral lacrimal glands: Secondary | ICD-10-CM | POA: Diagnosis not present

## 2023-09-09 DIAGNOSIS — D135 Benign neoplasm of extrahepatic bile ducts: Secondary | ICD-10-CM | POA: Diagnosis not present

## 2023-09-09 DIAGNOSIS — H04123 Dry eye syndrome of bilateral lacrimal glands: Secondary | ICD-10-CM | POA: Diagnosis not present

## 2023-09-09 DIAGNOSIS — I10 Essential (primary) hypertension: Secondary | ICD-10-CM | POA: Diagnosis not present

## 2023-09-09 DIAGNOSIS — K831 Obstruction of bile duct: Secondary | ICD-10-CM | POA: Diagnosis not present

## 2023-09-09 DIAGNOSIS — R634 Abnormal weight loss: Secondary | ICD-10-CM | POA: Diagnosis not present

## 2023-09-09 DIAGNOSIS — K5730 Diverticulosis of large intestine without perforation or abscess without bleeding: Secondary | ICD-10-CM | POA: Diagnosis not present

## 2023-09-10 DIAGNOSIS — H04123 Dry eye syndrome of bilateral lacrimal glands: Secondary | ICD-10-CM | POA: Diagnosis not present

## 2023-09-10 DIAGNOSIS — D135 Benign neoplasm of extrahepatic bile ducts: Secondary | ICD-10-CM | POA: Diagnosis not present

## 2023-09-10 DIAGNOSIS — R634 Abnormal weight loss: Secondary | ICD-10-CM | POA: Diagnosis not present

## 2023-09-10 DIAGNOSIS — K5730 Diverticulosis of large intestine without perforation or abscess without bleeding: Secondary | ICD-10-CM | POA: Diagnosis not present

## 2023-09-10 DIAGNOSIS — I10 Essential (primary) hypertension: Secondary | ICD-10-CM | POA: Diagnosis not present

## 2023-09-10 DIAGNOSIS — K831 Obstruction of bile duct: Secondary | ICD-10-CM | POA: Diagnosis not present

## 2023-09-12 DIAGNOSIS — K831 Obstruction of bile duct: Secondary | ICD-10-CM | POA: Diagnosis not present

## 2023-09-12 DIAGNOSIS — I10 Essential (primary) hypertension: Secondary | ICD-10-CM | POA: Diagnosis not present

## 2023-09-12 DIAGNOSIS — H04123 Dry eye syndrome of bilateral lacrimal glands: Secondary | ICD-10-CM | POA: Diagnosis not present

## 2023-09-12 DIAGNOSIS — K5730 Diverticulosis of large intestine without perforation or abscess without bleeding: Secondary | ICD-10-CM | POA: Diagnosis not present

## 2023-09-12 DIAGNOSIS — D135 Benign neoplasm of extrahepatic bile ducts: Secondary | ICD-10-CM | POA: Diagnosis not present

## 2023-09-12 DIAGNOSIS — R634 Abnormal weight loss: Secondary | ICD-10-CM | POA: Diagnosis not present

## 2023-09-14 DIAGNOSIS — R634 Abnormal weight loss: Secondary | ICD-10-CM | POA: Diagnosis not present

## 2023-09-14 DIAGNOSIS — K831 Obstruction of bile duct: Secondary | ICD-10-CM | POA: Diagnosis not present

## 2023-09-14 DIAGNOSIS — H04123 Dry eye syndrome of bilateral lacrimal glands: Secondary | ICD-10-CM | POA: Diagnosis not present

## 2023-09-14 DIAGNOSIS — K5730 Diverticulosis of large intestine without perforation or abscess without bleeding: Secondary | ICD-10-CM | POA: Diagnosis not present

## 2023-09-14 DIAGNOSIS — I10 Essential (primary) hypertension: Secondary | ICD-10-CM | POA: Diagnosis not present

## 2023-09-14 DIAGNOSIS — D135 Benign neoplasm of extrahepatic bile ducts: Secondary | ICD-10-CM | POA: Diagnosis not present

## 2023-09-16 DIAGNOSIS — K838 Other specified diseases of biliary tract: Secondary | ICD-10-CM | POA: Diagnosis not present

## 2023-09-16 DIAGNOSIS — K760 Fatty (change of) liver, not elsewhere classified: Secondary | ICD-10-CM | POA: Diagnosis not present

## 2023-09-16 DIAGNOSIS — R634 Abnormal weight loss: Secondary | ICD-10-CM | POA: Diagnosis not present

## 2023-09-16 DIAGNOSIS — K5730 Diverticulosis of large intestine without perforation or abscess without bleeding: Secondary | ICD-10-CM | POA: Diagnosis not present

## 2023-09-16 DIAGNOSIS — K831 Obstruction of bile duct: Secondary | ICD-10-CM | POA: Diagnosis not present

## 2023-09-16 DIAGNOSIS — D135 Benign neoplasm of extrahepatic bile ducts: Secondary | ICD-10-CM | POA: Diagnosis not present

## 2023-09-16 DIAGNOSIS — K8041 Calculus of bile duct with cholecystitis, unspecified, with obstruction: Secondary | ICD-10-CM | POA: Diagnosis not present

## 2023-09-16 DIAGNOSIS — R6521 Severe sepsis with septic shock: Secondary | ICD-10-CM | POA: Diagnosis not present

## 2023-09-16 DIAGNOSIS — K839 Disease of biliary tract, unspecified: Secondary | ICD-10-CM | POA: Diagnosis not present

## 2023-09-16 DIAGNOSIS — H04123 Dry eye syndrome of bilateral lacrimal glands: Secondary | ICD-10-CM | POA: Diagnosis not present

## 2023-09-16 DIAGNOSIS — K828 Other specified diseases of gallbladder: Secondary | ICD-10-CM | POA: Diagnosis not present

## 2023-09-16 DIAGNOSIS — I10 Essential (primary) hypertension: Secondary | ICD-10-CM | POA: Diagnosis not present

## 2023-09-18 DIAGNOSIS — D135 Benign neoplasm of extrahepatic bile ducts: Secondary | ICD-10-CM | POA: Diagnosis not present

## 2023-09-18 DIAGNOSIS — I10 Essential (primary) hypertension: Secondary | ICD-10-CM | POA: Diagnosis not present

## 2023-09-18 DIAGNOSIS — K5730 Diverticulosis of large intestine without perforation or abscess without bleeding: Secondary | ICD-10-CM | POA: Diagnosis not present

## 2023-09-18 DIAGNOSIS — R634 Abnormal weight loss: Secondary | ICD-10-CM | POA: Diagnosis not present

## 2023-09-18 DIAGNOSIS — H04123 Dry eye syndrome of bilateral lacrimal glands: Secondary | ICD-10-CM | POA: Diagnosis not present

## 2023-09-18 DIAGNOSIS — K831 Obstruction of bile duct: Secondary | ICD-10-CM | POA: Diagnosis not present

## 2023-09-20 DIAGNOSIS — K5730 Diverticulosis of large intestine without perforation or abscess without bleeding: Secondary | ICD-10-CM | POA: Diagnosis not present

## 2023-09-20 DIAGNOSIS — R634 Abnormal weight loss: Secondary | ICD-10-CM | POA: Diagnosis not present

## 2023-09-20 DIAGNOSIS — H04123 Dry eye syndrome of bilateral lacrimal glands: Secondary | ICD-10-CM | POA: Diagnosis not present

## 2023-09-20 DIAGNOSIS — I10 Essential (primary) hypertension: Secondary | ICD-10-CM | POA: Diagnosis not present

## 2023-09-20 DIAGNOSIS — D135 Benign neoplasm of extrahepatic bile ducts: Secondary | ICD-10-CM | POA: Diagnosis not present

## 2023-09-20 DIAGNOSIS — K831 Obstruction of bile duct: Secondary | ICD-10-CM | POA: Diagnosis not present

## 2023-09-21 DIAGNOSIS — I10 Essential (primary) hypertension: Secondary | ICD-10-CM | POA: Diagnosis not present

## 2023-09-21 DIAGNOSIS — K831 Obstruction of bile duct: Secondary | ICD-10-CM | POA: Diagnosis not present

## 2023-09-21 DIAGNOSIS — H04123 Dry eye syndrome of bilateral lacrimal glands: Secondary | ICD-10-CM | POA: Diagnosis not present

## 2023-09-21 DIAGNOSIS — D135 Benign neoplasm of extrahepatic bile ducts: Secondary | ICD-10-CM | POA: Diagnosis not present

## 2023-09-21 DIAGNOSIS — R634 Abnormal weight loss: Secondary | ICD-10-CM | POA: Diagnosis not present

## 2023-09-21 DIAGNOSIS — K5730 Diverticulosis of large intestine without perforation or abscess without bleeding: Secondary | ICD-10-CM | POA: Diagnosis not present

## 2023-09-23 DIAGNOSIS — K831 Obstruction of bile duct: Secondary | ICD-10-CM | POA: Diagnosis not present

## 2023-09-23 DIAGNOSIS — R634 Abnormal weight loss: Secondary | ICD-10-CM | POA: Diagnosis not present

## 2023-09-23 DIAGNOSIS — K5730 Diverticulosis of large intestine without perforation or abscess without bleeding: Secondary | ICD-10-CM | POA: Diagnosis not present

## 2023-09-23 DIAGNOSIS — H04123 Dry eye syndrome of bilateral lacrimal glands: Secondary | ICD-10-CM | POA: Diagnosis not present

## 2023-09-23 DIAGNOSIS — D135 Benign neoplasm of extrahepatic bile ducts: Secondary | ICD-10-CM | POA: Diagnosis not present

## 2023-09-23 DIAGNOSIS — I10 Essential (primary) hypertension: Secondary | ICD-10-CM | POA: Diagnosis not present

## 2023-09-26 ENCOUNTER — Telehealth: Payer: Self-pay | Admitting: Nurse Practitioner

## 2023-09-26 DIAGNOSIS — K5730 Diverticulosis of large intestine without perforation or abscess without bleeding: Secondary | ICD-10-CM | POA: Diagnosis not present

## 2023-09-26 DIAGNOSIS — I10 Essential (primary) hypertension: Secondary | ICD-10-CM | POA: Diagnosis not present

## 2023-09-26 DIAGNOSIS — K831 Obstruction of bile duct: Secondary | ICD-10-CM | POA: Diagnosis not present

## 2023-09-26 DIAGNOSIS — D135 Benign neoplasm of extrahepatic bile ducts: Secondary | ICD-10-CM | POA: Diagnosis not present

## 2023-09-26 DIAGNOSIS — R634 Abnormal weight loss: Secondary | ICD-10-CM | POA: Diagnosis not present

## 2023-09-26 DIAGNOSIS — H04123 Dry eye syndrome of bilateral lacrimal glands: Secondary | ICD-10-CM | POA: Diagnosis not present

## 2023-09-26 NOTE — Telephone Encounter (Signed)
Received Certificate of Skilled Therapy. Gave to [employer]

## 2023-09-27 ENCOUNTER — Telehealth: Payer: Self-pay | Admitting: Nurse Practitioner

## 2023-09-27 NOTE — Telephone Encounter (Signed)
08/06/2023 OT Eval & Plan of Treatment faxed back to [facility]; [phone]. Scanned-Toni

## 2023-09-28 DIAGNOSIS — R634 Abnormal weight loss: Secondary | ICD-10-CM | POA: Diagnosis not present

## 2023-09-28 DIAGNOSIS — D135 Benign neoplasm of extrahepatic bile ducts: Secondary | ICD-10-CM | POA: Diagnosis not present

## 2023-09-28 DIAGNOSIS — I10 Essential (primary) hypertension: Secondary | ICD-10-CM | POA: Diagnosis not present

## 2023-09-28 DIAGNOSIS — K5730 Diverticulosis of large intestine without perforation or abscess without bleeding: Secondary | ICD-10-CM | POA: Diagnosis not present

## 2023-09-28 DIAGNOSIS — H04123 Dry eye syndrome of bilateral lacrimal glands: Secondary | ICD-10-CM | POA: Diagnosis not present

## 2023-09-28 DIAGNOSIS — K831 Obstruction of bile duct: Secondary | ICD-10-CM | POA: Diagnosis not present

## 2023-09-30 DIAGNOSIS — K831 Obstruction of bile duct: Secondary | ICD-10-CM | POA: Diagnosis not present

## 2023-09-30 DIAGNOSIS — R634 Abnormal weight loss: Secondary | ICD-10-CM | POA: Diagnosis not present

## 2023-09-30 DIAGNOSIS — H04123 Dry eye syndrome of bilateral lacrimal glands: Secondary | ICD-10-CM | POA: Diagnosis not present

## 2023-09-30 DIAGNOSIS — I10 Essential (primary) hypertension: Secondary | ICD-10-CM | POA: Diagnosis not present

## 2023-09-30 DIAGNOSIS — K5730 Diverticulosis of large intestine without perforation or abscess without bleeding: Secondary | ICD-10-CM | POA: Diagnosis not present

## 2023-09-30 DIAGNOSIS — D135 Benign neoplasm of extrahepatic bile ducts: Secondary | ICD-10-CM | POA: Diagnosis not present

## 2023-10-03 DIAGNOSIS — K831 Obstruction of bile duct: Secondary | ICD-10-CM | POA: Diagnosis not present

## 2023-10-03 DIAGNOSIS — H04123 Dry eye syndrome of bilateral lacrimal glands: Secondary | ICD-10-CM | POA: Diagnosis not present

## 2023-10-03 DIAGNOSIS — K5730 Diverticulosis of large intestine without perforation or abscess without bleeding: Secondary | ICD-10-CM | POA: Diagnosis not present

## 2023-10-03 DIAGNOSIS — D135 Benign neoplasm of extrahepatic bile ducts: Secondary | ICD-10-CM | POA: Diagnosis not present

## 2023-10-03 DIAGNOSIS — R634 Abnormal weight loss: Secondary | ICD-10-CM | POA: Diagnosis not present

## 2023-10-03 DIAGNOSIS — I10 Essential (primary) hypertension: Secondary | ICD-10-CM | POA: Diagnosis not present

## 2023-10-04 DIAGNOSIS — K5730 Diverticulosis of large intestine without perforation or abscess without bleeding: Secondary | ICD-10-CM | POA: Diagnosis not present

## 2023-10-04 DIAGNOSIS — K831 Obstruction of bile duct: Secondary | ICD-10-CM | POA: Diagnosis not present

## 2023-10-04 DIAGNOSIS — R634 Abnormal weight loss: Secondary | ICD-10-CM | POA: Diagnosis not present

## 2023-10-04 DIAGNOSIS — H04123 Dry eye syndrome of bilateral lacrimal glands: Secondary | ICD-10-CM | POA: Diagnosis not present

## 2023-10-04 DIAGNOSIS — I10 Essential (primary) hypertension: Secondary | ICD-10-CM | POA: Diagnosis not present

## 2023-10-04 DIAGNOSIS — D135 Benign neoplasm of extrahepatic bile ducts: Secondary | ICD-10-CM | POA: Diagnosis not present

## 2023-10-07 DIAGNOSIS — I10 Essential (primary) hypertension: Secondary | ICD-10-CM | POA: Diagnosis not present

## 2023-10-07 DIAGNOSIS — D135 Benign neoplasm of extrahepatic bile ducts: Secondary | ICD-10-CM | POA: Diagnosis not present

## 2023-10-07 DIAGNOSIS — K5730 Diverticulosis of large intestine without perforation or abscess without bleeding: Secondary | ICD-10-CM | POA: Diagnosis not present

## 2023-10-07 DIAGNOSIS — K831 Obstruction of bile duct: Secondary | ICD-10-CM | POA: Diagnosis not present

## 2023-10-07 DIAGNOSIS — R634 Abnormal weight loss: Secondary | ICD-10-CM | POA: Diagnosis not present

## 2023-10-07 DIAGNOSIS — H04123 Dry eye syndrome of bilateral lacrimal glands: Secondary | ICD-10-CM | POA: Diagnosis not present

## 2023-10-10 DIAGNOSIS — I10 Essential (primary) hypertension: Secondary | ICD-10-CM | POA: Diagnosis not present

## 2023-10-10 DIAGNOSIS — K5730 Diverticulosis of large intestine without perforation or abscess without bleeding: Secondary | ICD-10-CM | POA: Diagnosis not present

## 2023-10-10 DIAGNOSIS — H04123 Dry eye syndrome of bilateral lacrimal glands: Secondary | ICD-10-CM | POA: Diagnosis not present

## 2023-10-10 DIAGNOSIS — R634 Abnormal weight loss: Secondary | ICD-10-CM | POA: Diagnosis not present

## 2023-10-10 DIAGNOSIS — D135 Benign neoplasm of extrahepatic bile ducts: Secondary | ICD-10-CM | POA: Diagnosis not present

## 2023-10-10 DIAGNOSIS — K831 Obstruction of bile duct: Secondary | ICD-10-CM | POA: Diagnosis not present

## 2023-10-14 DIAGNOSIS — K831 Obstruction of bile duct: Secondary | ICD-10-CM | POA: Diagnosis not present

## 2023-10-14 DIAGNOSIS — H04123 Dry eye syndrome of bilateral lacrimal glands: Secondary | ICD-10-CM | POA: Diagnosis not present

## 2023-10-14 DIAGNOSIS — R634 Abnormal weight loss: Secondary | ICD-10-CM | POA: Diagnosis not present

## 2023-10-14 DIAGNOSIS — I10 Essential (primary) hypertension: Secondary | ICD-10-CM | POA: Diagnosis not present

## 2023-10-14 DIAGNOSIS — K5730 Diverticulosis of large intestine without perforation or abscess without bleeding: Secondary | ICD-10-CM | POA: Diagnosis not present

## 2023-10-14 DIAGNOSIS — D135 Benign neoplasm of extrahepatic bile ducts: Secondary | ICD-10-CM | POA: Diagnosis not present

## 2023-10-16 DIAGNOSIS — R6521 Severe sepsis with septic shock: Secondary | ICD-10-CM | POA: Diagnosis not present

## 2023-10-16 DIAGNOSIS — R634 Abnormal weight loss: Secondary | ICD-10-CM | POA: Diagnosis not present

## 2023-10-16 DIAGNOSIS — K760 Fatty (change of) liver, not elsewhere classified: Secondary | ICD-10-CM | POA: Diagnosis not present

## 2023-10-16 DIAGNOSIS — H04123 Dry eye syndrome of bilateral lacrimal glands: Secondary | ICD-10-CM | POA: Diagnosis not present

## 2023-10-16 DIAGNOSIS — K5730 Diverticulosis of large intestine without perforation or abscess without bleeding: Secondary | ICD-10-CM | POA: Diagnosis not present

## 2023-10-16 DIAGNOSIS — I10 Essential (primary) hypertension: Secondary | ICD-10-CM | POA: Diagnosis not present

## 2023-10-16 DIAGNOSIS — K838 Other specified diseases of biliary tract: Secondary | ICD-10-CM | POA: Diagnosis not present

## 2023-10-16 DIAGNOSIS — K831 Obstruction of bile duct: Secondary | ICD-10-CM | POA: Diagnosis not present

## 2023-10-16 DIAGNOSIS — K839 Disease of biliary tract, unspecified: Secondary | ICD-10-CM | POA: Diagnosis not present

## 2023-10-16 DIAGNOSIS — K8041 Calculus of bile duct with cholecystitis, unspecified, with obstruction: Secondary | ICD-10-CM | POA: Diagnosis not present

## 2023-10-16 DIAGNOSIS — K828 Other specified diseases of gallbladder: Secondary | ICD-10-CM | POA: Diagnosis not present

## 2023-10-16 DIAGNOSIS — D135 Benign neoplasm of extrahepatic bile ducts: Secondary | ICD-10-CM | POA: Diagnosis not present

## 2023-10-17 DIAGNOSIS — I10 Essential (primary) hypertension: Secondary | ICD-10-CM | POA: Diagnosis not present

## 2023-10-17 DIAGNOSIS — R634 Abnormal weight loss: Secondary | ICD-10-CM | POA: Diagnosis not present

## 2023-10-17 DIAGNOSIS — D135 Benign neoplasm of extrahepatic bile ducts: Secondary | ICD-10-CM | POA: Diagnosis not present

## 2023-10-17 DIAGNOSIS — K5730 Diverticulosis of large intestine without perforation or abscess without bleeding: Secondary | ICD-10-CM | POA: Diagnosis not present

## 2023-10-17 DIAGNOSIS — H04123 Dry eye syndrome of bilateral lacrimal glands: Secondary | ICD-10-CM | POA: Diagnosis not present

## 2023-10-17 DIAGNOSIS — K831 Obstruction of bile duct: Secondary | ICD-10-CM | POA: Diagnosis not present

## 2023-10-18 ENCOUNTER — Other Ambulatory Visit: Payer: Self-pay

## 2023-10-18 ENCOUNTER — Emergency Department

## 2023-10-18 ENCOUNTER — Inpatient Hospital Stay
Admission: EM | Admit: 2023-10-18 | Discharge: 2023-10-21 | DRG: 300 | Disposition: A | Discharge status: Hospice | Attending: Internal Medicine | Admitting: Internal Medicine

## 2023-10-18 DIAGNOSIS — B952 Enterococcus as the cause of diseases classified elsewhere: Secondary | ICD-10-CM | POA: Diagnosis present

## 2023-10-18 DIAGNOSIS — Z86718 Personal history of other venous thrombosis and embolism: Secondary | ICD-10-CM | POA: Diagnosis not present

## 2023-10-18 DIAGNOSIS — I129 Hypertensive chronic kidney disease with stage 1 through stage 4 chronic kidney disease, or unspecified chronic kidney disease: Secondary | ICD-10-CM | POA: Diagnosis present

## 2023-10-18 DIAGNOSIS — B965 Pseudomonas (aeruginosa) (mallei) (pseudomallei) as the cause of diseases classified elsewhere: Secondary | ICD-10-CM | POA: Diagnosis present

## 2023-10-18 DIAGNOSIS — Z7401 Bed confinement status: Secondary | ICD-10-CM | POA: Diagnosis not present

## 2023-10-18 DIAGNOSIS — Z66 Do not resuscitate: Secondary | ICD-10-CM | POA: Diagnosis present

## 2023-10-18 DIAGNOSIS — I10 Essential (primary) hypertension: Secondary | ICD-10-CM | POA: Diagnosis not present

## 2023-10-18 DIAGNOSIS — M7989 Other specified soft tissue disorders: Secondary | ICD-10-CM | POA: Diagnosis present

## 2023-10-18 DIAGNOSIS — Z8616 Personal history of COVID-19: Secondary | ICD-10-CM

## 2023-10-18 DIAGNOSIS — R29898 Other symptoms and signs involving the musculoskeletal system: Secondary | ICD-10-CM | POA: Diagnosis not present

## 2023-10-18 DIAGNOSIS — R634 Abnormal weight loss: Secondary | ICD-10-CM | POA: Diagnosis not present

## 2023-10-18 DIAGNOSIS — B9620 Unspecified Escherichia coli [E. coli] as the cause of diseases classified elsewhere: Secondary | ICD-10-CM | POA: Diagnosis present

## 2023-10-18 DIAGNOSIS — E1122 Type 2 diabetes mellitus with diabetic chronic kidney disease: Secondary | ICD-10-CM | POA: Diagnosis not present

## 2023-10-18 DIAGNOSIS — R600 Localized edema: Secondary | ICD-10-CM | POA: Diagnosis not present

## 2023-10-18 DIAGNOSIS — Z885 Allergy status to narcotic agent status: Secondary | ICD-10-CM

## 2023-10-18 DIAGNOSIS — I513 Intracardiac thrombosis, not elsewhere classified: Secondary | ICD-10-CM | POA: Diagnosis present

## 2023-10-18 DIAGNOSIS — R531 Weakness: Secondary | ICD-10-CM

## 2023-10-18 DIAGNOSIS — I82452 Acute embolism and thrombosis of left peroneal vein: Secondary | ICD-10-CM

## 2023-10-18 DIAGNOSIS — E785 Hyperlipidemia, unspecified: Secondary | ICD-10-CM | POA: Diagnosis present

## 2023-10-18 DIAGNOSIS — Z833 Family history of diabetes mellitus: Secondary | ICD-10-CM

## 2023-10-18 DIAGNOSIS — I82409 Acute embolism and thrombosis of unspecified deep veins of unspecified lower extremity: Secondary | ICD-10-CM | POA: Diagnosis present

## 2023-10-18 DIAGNOSIS — K831 Obstruction of bile duct: Secondary | ICD-10-CM | POA: Diagnosis not present

## 2023-10-18 DIAGNOSIS — D135 Benign neoplasm of extrahepatic bile ducts: Secondary | ICD-10-CM | POA: Diagnosis not present

## 2023-10-18 DIAGNOSIS — Z85118 Personal history of other malignant neoplasm of bronchus and lung: Secondary | ICD-10-CM

## 2023-10-18 DIAGNOSIS — Z880 Allergy status to penicillin: Secondary | ICD-10-CM

## 2023-10-18 DIAGNOSIS — Z853 Personal history of malignant neoplasm of breast: Secondary | ICD-10-CM

## 2023-10-18 DIAGNOSIS — N182 Chronic kidney disease, stage 2 (mild): Secondary | ICD-10-CM | POA: Diagnosis not present

## 2023-10-18 DIAGNOSIS — Z90710 Acquired absence of both cervix and uterus: Secondary | ICD-10-CM

## 2023-10-18 DIAGNOSIS — K5909 Other constipation: Secondary | ICD-10-CM | POA: Diagnosis not present

## 2023-10-18 DIAGNOSIS — N3001 Acute cystitis with hematuria: Secondary | ICD-10-CM | POA: Diagnosis not present

## 2023-10-18 DIAGNOSIS — I480 Paroxysmal atrial fibrillation: Secondary | ICD-10-CM | POA: Diagnosis present

## 2023-10-18 DIAGNOSIS — J449 Chronic obstructive pulmonary disease, unspecified: Secondary | ICD-10-CM | POA: Diagnosis present

## 2023-10-18 DIAGNOSIS — I82402 Acute embolism and thrombosis of unspecified deep veins of left lower extremity: Principal | ICD-10-CM | POA: Diagnosis present

## 2023-10-18 DIAGNOSIS — Z87891 Personal history of nicotine dependence: Secondary | ICD-10-CM | POA: Diagnosis not present

## 2023-10-18 DIAGNOSIS — K5900 Constipation, unspecified: Secondary | ICD-10-CM

## 2023-10-18 DIAGNOSIS — Z8673 Personal history of transient ischemic attack (TIA), and cerebral infarction without residual deficits: Secondary | ICD-10-CM

## 2023-10-18 DIAGNOSIS — H04123 Dry eye syndrome of bilateral lacrimal glands: Secondary | ICD-10-CM | POA: Diagnosis not present

## 2023-10-18 DIAGNOSIS — K5730 Diverticulosis of large intestine without perforation or abscess without bleeding: Secondary | ICD-10-CM | POA: Diagnosis not present

## 2023-10-18 DIAGNOSIS — Z882 Allergy status to sulfonamides status: Secondary | ICD-10-CM

## 2023-10-18 DIAGNOSIS — Z85528 Personal history of other malignant neoplasm of kidney: Secondary | ICD-10-CM

## 2023-10-18 DIAGNOSIS — Z79899 Other long term (current) drug therapy: Secondary | ICD-10-CM

## 2023-10-18 DIAGNOSIS — L539 Erythematous condition, unspecified: Secondary | ICD-10-CM | POA: Diagnosis not present

## 2023-10-18 DIAGNOSIS — R609 Edema, unspecified: Secondary | ICD-10-CM | POA: Diagnosis not present

## 2023-10-18 DIAGNOSIS — I82512 Chronic embolism and thrombosis of left femoral vein: Secondary | ICD-10-CM | POA: Diagnosis not present

## 2023-10-18 DIAGNOSIS — Z888 Allergy status to other drugs, medicaments and biological substances status: Secondary | ICD-10-CM

## 2023-10-18 DIAGNOSIS — Z7901 Long term (current) use of anticoagulants: Secondary | ICD-10-CM | POA: Diagnosis not present

## 2023-10-18 DIAGNOSIS — Z96643 Presence of artificial hip joint, bilateral: Secondary | ICD-10-CM | POA: Diagnosis present

## 2023-10-18 DIAGNOSIS — Z7982 Long term (current) use of aspirin: Secondary | ICD-10-CM

## 2023-10-18 LAB — COMPREHENSIVE METABOLIC PANEL
ALT: 12 U/L (ref 0–44)
AST: 17 U/L (ref 15–41)
Albumin: 3.1 g/dL — ABNORMAL LOW (ref 3.5–5.0)
Alkaline Phosphatase: 82 U/L (ref 38–126)
Anion gap: 7 (ref 5–15)
BUN: 22 mg/dL (ref 8–23)
CO2: 28 mmol/L (ref 22–32)
Calcium: 9.1 mg/dL (ref 8.9–10.3)
Chloride: 100 mmol/L (ref 98–111)
Creatinine, Ser: 0.91 mg/dL (ref 0.44–1.00)
GFR, Estimated: 58 mL/min — ABNORMAL LOW (ref >60)
Glucose, Bld: 217 mg/dL — ABNORMAL HIGH (ref 70–99)
Potassium: 4 mmol/L (ref 3.5–5.1)
Sodium: 135 mmol/L (ref 135–145)
Total Bilirubin: 0.7 mg/dL (ref <1.2)
Total Protein: 6.1 g/dL — ABNORMAL LOW (ref 6.5–8.1)

## 2023-10-18 LAB — URINALYSIS, W/ REFLEX TO CULTURE (INFECTION SUSPECTED)
Bilirubin Urine: NEGATIVE
Glucose, UA: 50 mg/dL — AB
Ketones, ur: NEGATIVE mg/dL
Nitrite: NEGATIVE
Protein, ur: 30 mg/dL — AB
Specific Gravity, Urine: 1.003 — ABNORMAL LOW (ref 1.005–1.030)
WBC, UA: >50 WBC/hpf (ref 0–5)
pH: 7 (ref 5.0–8.0)

## 2023-10-18 LAB — CBC WITH DIFFERENTIAL/PLATELET
Abs Immature Granulocytes: 0.01 10*3/uL (ref 0.00–0.07)
Basophils Absolute: 0.1 10*3/uL (ref 0.0–0.1)
Basophils Relative: 1 %
Eosinophils Absolute: 0.3 10*3/uL (ref 0.0–0.5)
Eosinophils Relative: 4 %
HCT: 33.6 % — ABNORMAL LOW (ref 36.0–46.0)
Hemoglobin: 10.6 g/dL — ABNORMAL LOW (ref 12.0–15.0)
Immature Granulocytes: 0 %
Lymphocytes Relative: 27 %
Lymphs Abs: 1.7 10*3/uL (ref 0.7–4.0)
MCH: 26.8 pg (ref 26.0–34.0)
MCHC: 31.5 g/dL (ref 30.0–36.0)
MCV: 85.1 fL (ref 80.0–100.0)
Monocytes Absolute: 0.6 10*3/uL (ref 0.1–1.0)
Monocytes Relative: 10 %
Neutro Abs: 3.5 10*3/uL (ref 1.7–7.7)
Neutrophils Relative %: 58 %
Platelets: 220 10*3/uL (ref 150–400)
RBC: 3.95 MIL/uL (ref 3.87–5.11)
RDW: 13.6 % (ref 11.5–15.5)
WBC: 6.1 10*3/uL (ref 4.0–10.5)
nRBC: 0 % (ref 0.0–0.2)

## 2023-10-18 MED ORDER — SODIUM CHLORIDE 0.9 % IV SOLN
1.0000 g | Freq: Once | INTRAVENOUS | Status: AC
  Administered 2023-10-18: 1 g via INTRAVENOUS
  Filled 2023-10-18: qty 10

## 2023-10-18 MED ORDER — LORAZEPAM 0.5 MG PO TABS
0.5000 mg | ORAL_TABLET | Freq: Three times a day (TID) | ORAL | Status: DC | PRN
  Filled 2023-10-18 (×2): qty 1

## 2023-10-18 MED ORDER — OXYCODONE HCL 5 MG PO TABS
5.0000 mg | ORAL_TABLET | ORAL | Status: DC | PRN
Start: 2023-10-18 — End: 2023-10-21
  Administered 2023-10-18 – 2023-10-19 (×3): 5 mg via ORAL
  Filled 2023-10-18 (×4): qty 1

## 2023-10-18 MED ORDER — ENOXAPARIN SODIUM 60 MG/0.6ML IJ SOSY
60.0000 mg | PREFILLED_SYRINGE | Freq: Two times a day (BID) | INTRAMUSCULAR | Status: DC
  Administered 2023-10-18 – 2023-10-19 (×2): 60 mg via SUBCUTANEOUS
  Filled 2023-10-18 (×2): qty 0.6

## 2023-10-18 MED ORDER — BISACODYL 5 MG PO TBEC: 5.0000 mg | DELAYED_RELEASE_TABLET | Freq: Every day | ORAL | Status: DC | PRN

## 2023-10-18 MED ORDER — SODIUM CHLORIDE 0.9 % IV SOLN
1.0000 g | INTRAVENOUS | Status: DC
  Administered 2023-10-19 – 2023-10-20 (×2): 1 g via INTRAVENOUS
  Filled 2023-10-18 (×2): qty 10

## 2023-10-18 MED ORDER — MORPHINE SULFATE (PF) 2 MG/ML IV SOLN: 1.0000 mg | INTRAVENOUS | Status: DC | PRN

## 2023-10-18 MED ORDER — METOPROLOL SUCCINATE ER 25 MG PO TB24
25.0000 mg | ORAL_TABLET | Freq: Every day | ORAL | Status: DC
  Administered 2023-10-19 – 2023-10-21 (×3): 25 mg via ORAL
  Filled 2023-10-18 (×3): qty 1

## 2023-10-18 MED ORDER — ENOXAPARIN SODIUM 60 MG/0.6ML IJ SOSY
60.0000 mg | PREFILLED_SYRINGE | Freq: Once | INTRAMUSCULAR | Status: AC
  Administered 2023-10-18: 60 mg via SUBCUTANEOUS
  Filled 2023-10-18: qty 0.6

## 2023-10-18 MED ORDER — AMLODIPINE BESYLATE 5 MG PO TABS
10.0000 mg | ORAL_TABLET | Freq: Once | ORAL | Status: AC
  Administered 2023-10-18: 10 mg via ORAL
  Filled 2023-10-18: qty 2

## 2023-10-18 MED ORDER — SODIUM CHLORIDE 0.9 % IV SOLN: 1.0000 g | INTRAVENOUS | Status: DC

## 2023-10-18 MED ORDER — AMLODIPINE BESYLATE 5 MG PO TABS: 10.0000 mg | ORAL_TABLET | Freq: Every day | ORAL | Status: DC

## 2023-10-18 MED ORDER — SODIUM CHLORIDE 0.9% FLUSH
3.0000 mL | Freq: Two times a day (BID) | INTRAVENOUS | Status: DC
  Administered 2023-10-18 – 2023-10-21 (×7): 3 mL via INTRAVENOUS

## 2023-10-18 MED ORDER — BENAZEPRIL HCL 20 MG PO TABS
20.0000 mg | ORAL_TABLET | Freq: Every day | ORAL | Status: DC
  Administered 2023-10-19 – 2023-10-21 (×3): 20 mg via ORAL
  Filled 2023-10-18 (×3): qty 1

## 2023-10-18 NOTE — Progress Notes (Signed)
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 Patient presented to ED after pressing her life alert due to left leg swelling and pain. [facility] was not notified of this ED visit until called by hospital staff. She is admitted to the hospital as of 12.3.24 with diagnosis of UTI and DVT. Per Dr. Patric Dykes with [facility] this is a related hospital admission.   Patient awake and alert and complains of mild pain to her lower leg. She is also found to have a UTI for which family feels she is a bit altered. Family is verbalizing concerns over her continued ability to remain in her [place] due to her worsening ambulatory status.   Patient is inpatient appropriate with need for diagnosis/treatment of DVT and IV treatment of UTI.  Vital Signs- 97.8/84/17     178/70    spO2 100% room air I&O- 100/0 Abnormal Labs- Albumin 3.1, GFR 58, Hgb 10.6, Hct 33.6, UTI +  Diagnostics-  LEFT LOWER EXTREMITY VENOUS DOPPLER ULTRASOUND IMPRESSION: 1. Probable acute calf vein thrombus with nonocclusive thrombus in the left posterior tibial vein and potentially occlusive thrombus in the visualized left peroneal vein. 2. Stable chronic nonocclusive mural thrombus in the left common femoral and femoral veins.  Electronically Signed   By: Irish Lack M.D.   On: 10/18/2023 08:14  IV/PRN Meds- Rocephin 1g q24H Problem List-  LLE DVT: continue on lovenox.  UTI: UA was positive. Continue on IV rocephin. Urine cx is pending  Likely dementia: continue w/ supportive care  HTN: restart home dose of metoprolol, benazepril tomorrow AM   Anxiety: severity unknown. Ativan prn Discharge Planning- Ongoing, likely d/c to [place] facility once medically stable Family Contact- Talked with daughter at length by phone, discussed d/c to LTC facility  IDT: updated Goals of Care: DNR  Thank you  for the opportunity to participate in this patient's care, please don't hesitate to call for any hospice related questions or concerns.  Thea Gist BSN, [facility] Liaison [phone]

## 2023-10-18 NOTE — ED Triage Notes (Addendum)
Pt to ED via [facility] from [facility] c/o left leg swelling. Pt reports this has been an issue since 1949 but has gotten worse the past 12hrs. Pt unable to bear weight on left leg. Left leg visibly red and swollen. Denies CP, SOB, fevers, dizziness

## 2023-10-18 NOTE — Consult Note (Signed)
PHARMACY - ANTICOAGULATION CONSULT NOTE  Pharmacy Consult for Lovenox Indication: DVT  Allergies  Allergen Reactions   Sulfa Antibiotics Swelling   Celecoxib Nausea And Vomiting   Acetaminophen Itching   Codeine Rash   Lyrica [Pregabalin] Rash   Penicillin G Rash    Has patient had a PCN reaction causing immediate rash, facial/tongue/throat swelling, SOB or lightheadedness with hypotension: Yes Has patient had a PCN reaction causing severe rash involving mucus membranes or skin necrosis: No Has patient had a PCN reaction that required hospitalization: No Has patient had a PCN reaction occurring within the last 10 years: Unknown If all of the above answers are "NO", then may proceed with Cephalosporin use.   Petrolatum-Zinc Oxide Rash   Patient Measurements: Height: 5\' 4"  (162.6 cm) Weight: 57 kg (125 lb 10.6 oz) IBW/kg (Calculated) : 54.7  Vital Signs: Temp: 97.8 F (36.6 C) (12/03 1414) Temp Source: Oral (12/03 1414) BP: 173/81 (12/03 1430) Pulse Rate: 99 (12/03 1430)  Labs: Recent Labs    10/18/23 0529  HGB 10.6*  HCT 33.6*  PLT 220  CREATININE 0.91   Estimated Creatinine Clearance: 31.9 mL/min (by C-G formula based on SCr of 0.91 mg/dL).  Medical History: Past Medical History:  Diagnosis Date   Atrial fibrillation (HCC)    Bronchitis    Cancer (HCC)    Left leg growth, kidneys, lungs and breasts   Carcinoma of unknown primary (HCC)    COPD (chronic obstructive pulmonary disease) (HCC)    Diabetes mellitus without complication (HCC)    Hyperlipidemia    Hypertension    Pancreatitis    Pneumonia    Stroke (HCC)    TIA's   Vitamin B12 deficiency    Medications:  Received Lovenox 60 mg (1 mg/kg) x 1 dose at 1100   Assessment: Monica [name] is a 89 year old female that has presented with swelling and redness of the left lower leg. PMH is significant for atrial fibrillation, HTN, and T2DM, choledocholithiasis, and likely dementia. Patient does have a  history of DVT and during her most recent hospital admission her Eliquis was discontinued. She is a current hospice patient.   Patient was given one time treatment dose of Lovenox this morning. Pharmacy has been consulted for management of Lovenox for DVT. Ultrasound of the LLE shows probable acute calf vein thrombus with nonocclusive thrombus in the left posterior tibial vein and potentially occlusive thrombus in the left peroneal vein as well as a stable chronic nonocclusive mural thrombus in the left common femoral and femoral veins.   Goal of Therapy:  Monitor platelets by anticoagulation protocol: Yes   Plan:  Start Lovenox 60 mg (1 mg/kg) Q12H Monitor renal function closely for dose adjustment  Monitor CBC at least every 72 hours while on Lovenox   Littie Deeds, PharmD Pharmacy Resident  10/18/2023 3:30 PM

## 2023-10-18 NOTE — H&P (Signed)
History and Physical    Monica [name]:[id number] DOB: [birth year] DOA: 10/18/2023  PCP: Sallyanne Kuster, NP  Patient coming from: [facility]    Chief Complaint: left leg pain   HPI: 89 y/o F w/ PMH of a. fib, HTN, DM2, choledocholithiasis, likely dementia who presented w/ weakness and left leg swelling. All of the hx was obtained from chart review b/c pt was unable to provide any meaningful hx. [place] was positive for nonocclusive thrombus in left posterior tibial vein & potentially occlusive thrombus in the left peroneal vein. Stable chronic nonocclusive mural thrombus in the left common femoral & femoral veins. S/p lovenox x1. Of note, UA was done and found to be positive for leukocytosis & many bacteria. S/p IV ceftriaxone x 1. Unable to obtain a ROS b/c pt is unable to provide any meaningful hx   Review of Systems: unable to provide a ROS as pt unable to provide any meaningful hx    Past Medical History:  Diagnosis Date   Atrial fibrillation (HCC)    Bronchitis    Cancer (HCC)    Left leg growth, kidneys, lungs and breasts   Carcinoma of unknown primary (HCC)    COPD (chronic obstructive pulmonary disease) (HCC)    Diabetes mellitus without complication (HCC)    Hyperlipidemia    Hypertension    Pancreatitis    Pneumonia    Stroke (HCC)    TIA's   Vitamin B12 deficiency     Past Surgical History:  Procedure Laterality Date   ABDOMINAL HYSTERECTOMY  1975   BLADDER SUSPENSION  1989   CAROTID ANGIOGRAPHY Right 11/23/2019   Procedure: CAROTID ANGIOGRAPHY;  Surgeon: Renford Dills, MD;  Location: [facility] INVASIVE CV LAB;  Service: Cardiovascular;  Laterality: Right;   COLONOSCOPY     COLONOSCOPY WITH PROPOFOL N/A 01/25/2018   Procedure: COLONOSCOPY WITH PROPOFOL;  Surgeon: Midge Minium, MD;  Location: [facility] ENDOSCOPY;  Service: Endoscopy;  Laterality: N/A;   ESOPHAGOGASTRODUODENOSCOPY (EGD) WITH PROPOFOL N/A 01/24/2018   Procedure: ESOPHAGOGASTRODUODENOSCOPY (EGD) WITH  PROPOFOL;  Surgeon: Midge Minium, MD;  Location: [facility] ENDOSCOPY;  Service: Endoscopy;  Laterality: N/A;   HIP ARTHROPLASTY Left 06/03/2019   Procedure: ARTHROPLASTY BIPOLAR HIP (HEMIARTHROPLASTY);  Surgeon: Donato Heinz, MD;  Location: [facility] ORS;  Service: Orthopedics;  Laterality: Left;   HIP ARTHROPLASTY Right 04/21/2020   Procedure: ARTHROPLASTY BIPOLAR HIP (HEMIARTHROPLASTY);  Surgeon: Christena Flake, MD;  Location: [facility] ORS;  Service: Orthopedics;  Laterality: Right;     reports that she quit smoking about 34 years ago. Her smoking use included cigarettes. She started smoking about 49 years ago. She has a 7.5 pack-year smoking history. She has never used smokeless tobacco. She reports that she does not drink alcohol and does not use drugs.  Allergies  Allergen Reactions   Sulfa Antibiotics Swelling   Celecoxib Nausea And Vomiting   Acetaminophen Itching   Codeine Rash   Lyrica [Pregabalin] Rash   Penicillin G Rash    Has patient had a PCN reaction causing immediate rash, facial/tongue/throat swelling, SOB or lightheadedness with hypotension: Yes Has patient had a PCN reaction causing severe rash involving mucus membranes or skin necrosis: No Has patient had a PCN reaction that required hospitalization: No Has patient had a PCN reaction occurring within the last 10 years: Unknown If all of the above answers are "NO", then may proceed with Cephalosporin use.   Petrolatum-Zinc Oxide Rash    Family History  Problem Relation Age of Onset  Cancer Father    Diabetes Brother     Prior to Admission medications   Medication Sig Start Date End Date Taking? Authorizing Provider  albuterol (VENTOLIN HFA) 108 (90 Base) MCG/ACT inhaler INHALE TWO PUFFS INTO THE LUNGS EVERY 4 HOURS AS NEEDED FOR WHEEZING OR SHORTNESS OF BREATH 03/22/23   Sallyanne Kuster, NP  antiseptic oral rinse (BIOTENE) LIQD 15 mLs by Mouth Rinse route as needed for dry mouth. 08/22/23   Pennie Banter, DO   clotrimazole-betamethasone (LOTRISONE) cream Apply 1 Application topically 2 (two) times daily. 10/19/22   Sallyanne Kuster, NP  glycopyrrolate (ROBINUL) 1 MG tablet Take 1 tablet (1 mg total) by mouth every 4 (four) hours as needed (excessive secretions). 08/22/23   Esaw Grandchild A, DO  haloperidol (HALDOL) 1 MG tablet Take 1 tablet (1 mg total) by mouth every 8 (eight) hours as needed for agitation. 08/22/23 08/21/24  Esaw Grandchild A, DO  LORazepam (ATIVAN) 0.5 MG tablet Take 1 tablet (0.5 mg total) by mouth every 8 (eight) hours as needed for anxiety. 08/22/23 08/21/24  Pennie Banter, DO  lubiprostone (AMITIZA) 8 MCG capsule Take 1 capsule (8 mcg total) by mouth 2 (two) times daily with a meal. 05/23/23   Abernathy, Arlyss Repress, NP  meloxicam (MOBIC) 7.5 MG tablet Take 1 tablet (7.5 mg total) by mouth daily. 01/20/23   Sallyanne Kuster, NP  morphine 20 MG/5ML solution Take 1.3-2.5 mLs (5.2-10 mg total) by mouth every 2 (two) hours as needed (pain or air hunger). 08/22/23   Esaw Grandchild A, DO  NYSTATIN powder APPLY 1 APPLICATION TOPICALLY 3 TIMES DAILY TO GROIN AREA UNTIL RASH RESOLVES 06/28/23   Sallyanne Kuster, NP  ondansetron (ZOFRAN-ODT) 4 MG disintegrating tablet Take 1 tablet (4 mg total) by mouth every 6 (six) hours as needed for nausea. 08/22/23   Pennie Banter, DO  polyvinyl alcohol (LIQUIFILM TEARS) 1.4 % ophthalmic solution Place 1 drop into both eyes 4 (four) times daily as needed for dry eyes. 08/22/23   Pennie Banter, DO  famotidine (PEPCID) 20 MG tablet Take 1 tablet (20 mg total) by mouth 2 (two) times daily. 08/11/16 11/28/19  Katha Hamming, MD  pravastatin (PRAVACHOL) 40 MG tablet Take 1 tablet (40 mg total) by mouth daily. 08/15/19 11/28/19  Carlean Jews, NP    Physical Exam: Vitals:   10/18/23 0830 10/18/23 0930 10/18/23 0955 10/18/23 1001  BP: (!) 145/81 (!) 159/63    Pulse: 92 61    Resp: 15 (!) 21    Temp:   97.6 F (36.4 C)   TempSrc:   Oral   SpO2: 98%  98%    Weight:    57 kg  Height:    5\' 4"  (1.626 m)    Constitutional: NAD, agitated  Vitals:   10/18/23 0830 10/18/23 0930 10/18/23 0955 10/18/23 1001  BP: (!) 145/81 (!) 159/63    Pulse: 92 61    Resp: 15 (!) 21    Temp:   97.6 F (36.4 C)   TempSrc:   Oral   SpO2: 98% 98%    Weight:    57 kg  Height:    5\' 4"  (1.626 m)   Eyes: PERRL, lids and conjunctivae normal ENMT: Mucous membranes are moist.  Neck: normal, supple Respiratory: clear to auscultation bilaterally, no wheezing, no crackles. Normal respiratory effort. No accessory muscle use.  Cardiovascular: Regular rate and rhythm, no murmurs / rubs / gallops. Left lower extremity swelling  Abdomen: soft, no tenderness,  abd pain Bowel sounds positive.  Musculoskeletal: no clubbing / cyanosis.  Skin: no rashes, lesions, ulcers. Neurologic: CN 2-12 grossly intact. Moves all extremities  Psychiatric: poor judgement and insight . Alert and awake Agitated mood and affect    Labs on Admission: I have personally reviewed following labs and imaging studies  CBC: Recent Labs  Lab 10/18/23 0529  WBC 6.1  NEUTROABS 3.5  HGB 10.6*  HCT 33.6*  MCV 85.1  PLT 220   Basic Metabolic Panel: Recent Labs  Lab 10/18/23 0529  NA 135  K 4.0  CL 100  CO2 28  GLUCOSE 217*  BUN 22  CREATININE 0.91  CALCIUM 9.1   GFR: Estimated Creatinine Clearance: 31.9 mL/min (by C-G formula based on SCr of 0.91 mg/dL). Liver Function Tests: Recent Labs  Lab 10/18/23 0529  AST 17  ALT 12  ALKPHOS 82  BILITOT 0.7  PROT 6.1*  ALBUMIN 3.1*   No results for input(s): "LIPASE", "AMYLASE" in the last 168 hours. No results for input(s): "AMMONIA" in the last 168 hours. Coagulation Profile: No results for input(s): "INR", "PROTIME" in the last 168 hours. Cardiac Enzymes: No results for input(s): "CKTOTAL", "CKMB", "CKMBINDEX", "TROPONINI" in the last 168 hours. BNP (last 3 results) No results for input(s): "PROBNP" in the last 8760  hours. HbA1C: No results for input(s): "HGBA1C" in the last 72 hours. CBG: No results for input(s): "GLUCAP" in the last 168 hours. Lipid Profile: No results for input(s): "CHOL", "HDL", "LDLCALC", "TRIG", "CHOLHDL", "LDLDIRECT" in the last 72 hours. Thyroid Function Tests: No results for input(s): "TSH", "T4TOTAL", "FREET4", "T3FREE", "THYROIDAB" in the last 72 hours. Anemia Panel: No results for input(s): "VITAMINB12", "FOLATE", "FERRITIN", "TIBC", "IRON", "RETICCTPCT" in the last 72 hours. Urine analysis:    Component Value Date/Time   COLORURINE YELLOW (A) 10/18/2023 0720   APPEARANCEUR CLOUDY (A) 10/18/2023 0720   APPEARANCEUR CANCELED 11/24/2021 1341   LABSPEC 1.003 (L) 10/18/2023 0720   LABSPEC 1.012 03/04/2014 1535   PHURINE 7.0 10/18/2023 0720   GLUCOSEU 50 (A) 10/18/2023 0720   GLUCOSEU 50 mg/dL 78/46/9629 5284   HGBUR SMALL (A) 10/18/2023 0720   BILIRUBINUR NEGATIVE 10/18/2023 0720   BILIRUBINUR CANCELED 11/24/2021 1341   BILIRUBINUR Negative 03/04/2014 1535   KETONESUR NEGATIVE 10/18/2023 0720   PROTEINUR 30 (A) 10/18/2023 0720   UROBILINOGEN 0.2 05/27/2020 1240   NITRITE NEGATIVE 10/18/2023 0720   LEUKOCYTESUR LARGE (A) 10/18/2023 0720   LEUKOCYTESUR 3+ 03/04/2014 1535    Radiological Exams on Admission: US Venous Img Lower  Left (DVT Study)  Result Date: 10/18/2023 CLINICAL DATA:  Left lower extremity edema and erythema. History prior left lower extremity DVT. EXAM: LEFT LOWER EXTREMITY VENOUS DOPPLER ULTRASOUND TECHNIQUE: Gray-scale sonography with graded compression, as well as color Doppler and duplex ultrasound were performed to evaluate the lower extremity deep venous systems from the level of the common femoral vein and including the common femoral, femoral, profunda femoral, popliteal and calf veins including the posterior tibial, peroneal and gastrocnemius veins when visible. The superficial great saphenous vein was also interrogated. Spectral Doppler was  utilized to evaluate flow at rest and with distal augmentation maneuvers in the common femoral, femoral and popliteal veins. COMPARISON:  11/13/2018 FINDINGS: Contralateral Common Femoral Vein: Respiratory phasicity is normal and symmetric with the symptomatic side. No evidence of thrombus. Normal compressibility. Common Femoral Vein: Stable nonocclusive echogenic mural thrombus and web formation related to prior thrombus. Good flow is present. Saphenofemoral Junction: No evidence of thrombus. Normal compressibility and  flow on color Doppler imaging. Profunda Femoral Vein: No evidence of thrombus. Normal compressibility and flow on color Doppler imaging. Femoral Vein: Stable echogenic nonocclusive mural thrombus consistent with chronic thrombus. Good flow is present in the patent central lumen. Popliteal Vein: No evidence of thrombus. Normal compressibility, respiratory phasicity and response to augmentation. Calf Veins: Probable nonocclusive thrombus in the visualized posterior tibial vein and potentially occlusive thrombus in the visualized peroneal vein which appear new since 2019 and may be consistent with acute calf vein thrombus. Superficial Great Saphenous Vein: No evidence of thrombus. Normal compressibility. Venous Reflux:  None. Other Findings: No evidence of superficial thrombophlebitis or abnormal fluid collection. IMPRESSION: 1. Probable acute calf vein thrombus with nonocclusive thrombus in the left posterior tibial vein and potentially occlusive thrombus in the visualized left peroneal vein. 2. Stable chronic nonocclusive mural thrombus in the left common femoral and femoral veins. Electronically Signed   By: Irish Lack M.D.   On: 10/18/2023 08:14    EKG: Independently reviewed.   Assessment/Plan Active Problems:   * No active hospital problems. *  LLE DVT: continue on lovenox.   UTI: UA was positive. Continue on IV rocephin. Urine cx is pending   Likely dementia: continue w/  supportive care  HTN: restart home dose of metoprolol, benazepril tomorrow AM  Anxiety: severity unknown. Ativan prn     DVT prophylaxis: lovenox Code Status: DNR Family Communication: discussed pt's care w/ pt's daughter, Larene Beach and answered her questions  Disposition Plan: unclear, possibly continue w/ comfort care but pt's daughters Larene Beach and Hollie Salk are heading over to the hospital first  Consults called: hospice  Admission status: inpatient    Charise Killian MD [facility]   If 7PM-7AM, please contact night-coverage [email or web address]   10/18/2023, 10:24 AM

## 2023-10-18 NOTE — Progress Notes (Signed)
[facility] Liaison note:   This patient is a current [facility] patient. [facility] will continue to follow for discharge disposition.    Please call for any Hospice  related questions or concerns.   [facility] Liaison [phone]

## 2023-10-18 NOTE — ED Provider Notes (Signed)
[facility] Provider Note    Event Date/Time   First MD Initiated Contact with Patient 10/18/23 [phone]     (approximate)   History   Leg Swelling   HPI  Monica [name] is a 89 year old female with history of HTN, DVT, diabetes presenting to the emergency department for evaluation of weakness and leg swelling.  In triage, patient reported that she is here primarily for leg swelling that has been going on for a long time, but worse over the last 12 hours.  She tells me that she feels generally weak and was unable to stand up today which she is normally able to.  She denies fevers or chills.  No cough, congestion, chest pain, abdominal pain, nausea, vomiting, diarrhea, dysuria.  I did review patient's discharge summary from 08/22/2023.  At that time, patient presented with epigastric pain found to have obstructive choledocholithiasis.  Initial plan was for transfer, but no bed availability was present, but family decided to transfer to comfort measures.  While admitted, patient became medically stable and free of pain and she was discharged back to [facility] on hospice.  I did discuss patient goals with her on hospice.  She does report that she currently wishes for [place] to obtain normal testing, discussed invasive measures as needed.       Physical Exam   Triage Vital Signs: ED Triage Vitals [10/18/23 0525]  Encounter Vitals Group     BP (!) 194/70     Systolic BP Percentile      Diastolic BP Percentile      Pulse Rate 64     Resp 18     Temp (!) 97.4 F (36.3 C)     Temp Source Oral     SpO2 99 %     Weight      Height      Head Circumference      Peak Flow      Pain Score 4     Pain Loc      Pain Education      Exclude from Growth Chart     Most recent vital signs: Vitals:   10/18/23 0650 10/18/23 0653  BP: (!) 172/86   Pulse:  80  Resp: (!) 21   Temp:    SpO2: 97%      General: Awake, interactive  CV:  Regular rate, good peripheral  perfusion.  Resp:  Unlabored respirations, lungs good auscultation Abd:  Nondistended, no significant tenderness to palpation Neuro:  Symmetric facial movement, fluid speech MSK:  Bilateral lower extremity edema more notable on the left with overlying erythema and redness of the distal left lower extremity, see image below.       ED Results / Procedures / Treatments   Labs (all labs ordered are listed, but only abnormal results are displayed) Labs Reviewed  CBC WITH DIFFERENTIAL/PLATELET - Abnormal; Notable for the following components:      Result Value   Hemoglobin 10.6 (*)    HCT 33.6 (*)    All other components within normal limits  COMPREHENSIVE METABOLIC PANEL - Abnormal; Notable for the following components:   Glucose, Bld 217 (*)    Total Protein 6.1 (*)    Albumin 3.1 (*)    GFR, Estimated 58 (*)    All other components within normal limits  URINALYSIS, W/ REFLEX TO CULTURE (INFECTION SUSPECTED)     EKG EKG independently reviewed interpreted by myself (ER attending) demonstrates:  RADIOLOGY Imaging independently reviewed and interpreted by myself demonstrates:  [place] with possible DVT on my review, formal radiology read pending  PROCEDURES:  Critical Care performed: No  Procedures   MEDICATIONS ORDERED IN ED: Medications - No data to display   IMPRESSION / MDM / ASSESSMENT AND PLAN / ED COURSE  I reviewed the triage vital signs and the nursing notes.  Differential diagnosis includes, but is not limited to, DVT, cellulitis, anemia, electrolyte abnormality  Patient's presentation is most consistent with acute presentation with potential threat to life or bodily function.  89 year old female presenting with generalized weakness and leg swelling.  Hypertensive on presentation.  Exam does demonstrate erythema and swelling of her left lower extremity.  Does have a history of DVT and during her most recent admission her Eliquis was discontinued.  Consideration  also for cellulitis.  With her generalized weakness, will obtain labs, chest x-[place], urine in addition to an ultrasound of her lower extremity.  Labs without critical derangement.  Ultrasound formal read pending, possible DVT on my review.  As the patient is on hospice, do think discussion of both inpatient versus outpatient management is reasonable.  If ultrasound is negative, do think would be reasonable to treat as cellulitis.  Signed out oncoming provider pending ultrasound read and disposition.  FINAL CLINICAL IMPRESSION(S) / ED DIAGNOSES   Final diagnoses:  Leg swelling  Generalized weakness     Rx / DC Orders   ED Discharge Orders     None        Note:  This document was prepared using Dragon voice recognition software and may include unintentional dictation errors.   Trinna Post, MD 10/18/23 [phone]

## 2023-10-18 NOTE — ED Notes (Signed)
US at bedside

## 2023-10-18 NOTE — ED Provider Notes (Addendum)
Care assumed of patient from outgoing provider.  See their note for initial history, exam and plan.  Clinical Course as of 10/18/23 0957  Tue Oct 18, 2023  0703 Recent admission for cholodoco with sepsis - discontinued eliquis during that admission. Ultimately comfort care and hospice.  Presents with red swollen leg - work up DVT vs cellulitis. Follow up ultrasound and UA [SM]    Clinical Course User Index [SM] Corena Herter, MD   Contact acute DVT.  Patient does have swelling of redness to the left lower leg.  No chest pain or shortness of breath or symptoms of pulmonary embolism.  Stable on room air.  Patient does have findings concerning for urinary tract infection.  Patient was given treatment and urine was sent for culture.  On chart review last urine culture was sensitive to Rocephin.  Attempted to call daughter Hollie Salk multiple times without an answer.  Discussed with hospice team who stated that they would continue to follow as an inpatient.  Patient has difficulty bearing weight to the left lower leg secondary to acute DVT.  Lives in an independent living facility.  Given IV Rocephin, does not meet criteria for sepsis.  Urine was sent for culture. Discussed risk and benefit of anticoagulation with the patient given that she was discontinued from this medication when she had severe sepsis and was placed on comfort care.  States that she does not have frequent falls and usually uses a wheelchair.  Will give one-time dose of anticoagulation and continue discussed with family members risk and benefits.  Given treatment dose of Lovenox.  Consulted hospitalist for admission  .Critical Care  Performed by: Corena Herter, MD Authorized by: Corena Herter, MD   Critical care provider statement:    Critical care time (minutes):  30   Critical care time was exclusive of:  Separately billable procedures and treating other patients   Critical care was necessary to treat or prevent imminent or  life-threatening deterioration of the following conditions:  Circulatory failure   Critical care was time spent personally by me on the following activities:  Development of treatment plan with patient or surrogate, discussions with consultants, evaluation of patient's response to treatment, examination of patient, ordering and review of laboratory studies, ordering and review of radiographic studies, ordering and performing treatments and interventions, pulse oximetry, re-evaluation of patient's condition and review of old charts   Care discussed with: admitting provider       Corena Herter, MD 10/18/23 1610    Corena Herter, MD 10/18/23 [phone]

## 2023-10-18 NOTE — TOC Initial Note (Addendum)
Transition of Care [facility]) - Initial/Assessment Note    Patient Details  Name: Monica [name] MRN: [id number] Date of Birth: September 27, 1926  Transition of Care [facility]) CM/SW Contact:    Marquita Palms, LCSW Phone Number: 10/18/2023, 11:19 AM  Clinical Narrative:                  CSW spoke with [facility] to acknowledge continued Hospice. CSW completed HRA. Patient residing in [facility]. No other needs at this time.       Patient Goals and CMS Choice            Expected Discharge Plan and Services                                              Prior Living Arrangements/Services                       Activities of Daily Living      Permission Sought/Granted                  Emotional Assessment              Admission diagnosis:  DVT (deep venous thrombosis) (HCC) [I82.409] Patient Active Problem List   Diagnosis Date Noted   DVT (deep venous thrombosis) (HCC) 10/18/2023   End of life care 08/19/2023   Choledocholithiasis 08/19/2023   Severe sepsis with acute organ dysfunction (HCC) 08/19/2023   Septic shock (HCC) 08/19/2023   History of tobacco use 08/19/2023   GI bleed 04/13/2021   Right hip pain 11/02/2020   S/P hip replacement, right 11/02/2020   Need for vaccination against Streptococcus pneumoniae using pneumococcal conjugate vaccine 13 11/02/2020   Microcytic anemia    DOE (dyspnea on exertion) 09/09/2020   Acute on chronic anemia 09/08/2020   Fracture of femoral neck, right (HCC) 04/19/2020   Diabetes mellitus without complication (HCC) 04/19/2020   Fall at home, initial encounter 04/19/2020   Supratherapeutic INR 11/22/2019   Scalp hematoma, subsequent encounter 11/17/2019   Scalp laceration, subsequent encounter 10/26/2019   Muscle cramping 10/26/2019   Long term current use of anticoagulant therapy 10/26/2019   Encounter for general adult medical examination with abnormal findings 08/21/2019   Cellulitis of  right buttock 07/25/2019   Diabetic neuropathy (HCC) 06/25/2019   Type 2 diabetes mellitus with vascular disease (HCC) 06/25/2019   Pressure injury of skin 06/05/2019   'Light-for-dates' infant with signs of fetal malnutrition 05/31/2019   Closed left hip fracture (HCC) 05/31/2019   Inflammatory polyarthritis (HCC) 05/09/2019   Deep vein thrombosis (DVT) of non-extremity vein 11/29/2018   Edema of left lower extremity 11/13/2018   Cellulitis of left lower leg 11/01/2018   Encounter for therapeutic drug level monitoring 07/05/2018   Allergic contact dermatitis 04/19/2018   Atopic dermatitis 03/15/2018   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   Atrial fibrillation, chronic (HCC) 02/12/2018   Melena    Urinary tract infection with hematuria 01/04/2018   Chronic obstructive pulmonary disease (HCC) 12/27/2017   Acute cystitis with hematuria 12/27/2017   Dysuria 12/27/2017   Urinary frequency 12/27/2017   Uncontrolled type 2 diabetes mellitus with hyperglycemia (HCC) 12/27/2017   Iron deficiency anemia due to chronic blood loss 09/28/2016   Avitaminosis D 09/09/2016   B12 deficiency 09/09/2016   Temporary cerebral vascular dysfunction 09/09/2016  Spinal stenosis 09/09/2016   Pain in shoulder 09/09/2016   Restless legs syndrome 09/09/2016   Personal history of urinary calculi 09/09/2016   H/O deep venous thrombosis 09/09/2016   Gout 09/09/2016   Accumulation of fluid in tissues 09/09/2016   Carpal tunnel syndrome 09/09/2016   Chronic lung disease 09/09/2016   Cataract 09/09/2016   Appendicular ataxia 09/09/2016   Airway hyperreactivity 09/09/2016   Rectal bleeding    Acute blood loss anemia    Gastrointestinal hemorrhage 08/08/2016   Carcinoma of unknown primary (HCC) 08/02/2016   Diabetes (HCC) 05/08/2015   Arthropathy of hand 02/04/2010   Hypertonicity of bladder 08/31/2009   Detrusor instability of bladder 06/18/2009   Pure hypercholesterolemia 04/10/2009   Essential  hypertension 04/10/2009   Diverticulitis of colon 04/10/2009   PCP:  Sallyanne Kuster, NP Pharmacy:   [facility] - [place], [place] - [address] [place] [address] Phone: [phone] Fax: [phone]     Social Determinants of Health (SDOH) Social History: SDOH Screenings   Food Insecurity: No Food Insecurity (08/19/2023)  Housing: Low Risk  (08/19/2023)  Transportation Needs: No Transportation Needs (08/19/2023)  Utilities: Not At Risk (08/19/2023)  Alcohol Screen: Low Risk  (04/20/2022)  Depression (PHQ2-9): Low Risk  (12/01/2022)  Tobacco Use: Medium Risk (10/18/2023)   SDOH Interventions:     Readmission Risk Interventions    10/18/2023   11:18 AM  Readmission Risk Prevention Plan  Transportation Screening Complete  HRI or Home Care Consult Complete  Social Work Consult for Recovery Care Planning/Counseling Complete  Palliative Care Screening Complete

## 2023-10-18 NOTE — ED Notes (Signed)
Made provider aware that the daughter Hollie Salk is at bedside. Daughter wants to continue care at this time until she is able to have a conversation with her siblings about what to do.

## 2023-10-18 NOTE — ED Notes (Signed)
Pt called this nurse to the room and requested all members of her family to leave. Pt frustrated with them and doesn't want them back in her room. This includes two daughter and a granddaughter. Family instructed to come out of room at this time. Family states they are going home and if we need anything to give them a call. Point of contact is [name]. Phone number in chart verified for correction.

## 2023-10-18 NOTE — ED Notes (Signed)
ED TO INPATIENT HANDOFF REPORT  ED Nurse Name and Phone #: [phone]   S Name/Age/Gender Monica [name] 89 y.o. female Room/Bed: ED01A/ED01A  Code Status   Code Status: Do not attempt resuscitation (DNR) - Comfort care  Home/SNF/Other Pt from independent living but unsure if will be returning. Patient oriented to: self and place Is this baseline? Yes   Triage Complete: Triage complete  Chief Complaint DVT (deep venous thrombosis) (HCC) [I82.409]  Triage Note Pt to ED via [facility] from [facility] c/o left leg swelling. Pt reports this has been an issue since 1949 but has gotten worse the past 12hrs. Pt unable to bear weight on left leg. Left leg visibly red and swollen. Denies CP, SOB, fevers, dizziness   Allergies Allergies  Allergen Reactions   Sulfa Antibiotics Swelling   Celecoxib Nausea And Vomiting   Acetaminophen Itching   Codeine Rash   Lyrica [Pregabalin] Rash   Penicillin G Rash    Has patient had a PCN reaction causing immediate rash, facial/tongue/throat swelling, SOB or lightheadedness with hypotension: Yes Has patient had a PCN reaction causing severe rash involving mucus membranes or skin necrosis: No Has patient had a PCN reaction that required hospitalization: No Has patient had a PCN reaction occurring within the last 10 years: Unknown If all of the above answers are "NO", then may proceed with Cephalosporin use.   Petrolatum-Zinc Oxide Rash    Level of Care/Admitting Diagnosis ED Disposition     ED Disposition  Admit   Condition  --   Comment  Hospital Area: [facility] [[id number]]  Level of Care: Med-Surg [16]  Covid Evaluation: Asymptomatic - no recent exposure (last 10 days) testing not required  Diagnosis: DVT (deep venous thrombosis) [facility]) [[id number]]  Admitting Physician: Charise Killian [[id number]]  Attending Physician: Charise Killian [[id number]]  Certification:: I certify this patient will need  inpatient services for at least 2 midnights  Expected Medical Readiness: 10/20/2023          B Medical/Surgery History Past Medical History:  Diagnosis Date   Atrial fibrillation (HCC)    Bronchitis    Cancer (HCC)    Left leg growth, kidneys, lungs and breasts   Carcinoma of unknown primary (HCC)    COPD (chronic obstructive pulmonary disease) (HCC)    Diabetes mellitus without complication (HCC)    Hyperlipidemia    Hypertension    Pancreatitis    Pneumonia    Stroke (HCC)    TIA's   Vitamin B12 deficiency    Past Surgical History:  Procedure Laterality Date   ABDOMINAL HYSTERECTOMY  1975   BLADDER SUSPENSION  1989   CAROTID ANGIOGRAPHY Right 11/23/2019   Procedure: CAROTID ANGIOGRAPHY;  Surgeon: Renford Dills, MD;  Location: [facility] INVASIVE CV LAB;  Service: Cardiovascular;  Laterality: Right;   COLONOSCOPY     COLONOSCOPY WITH PROPOFOL N/A 01/25/2018   Procedure: COLONOSCOPY WITH PROPOFOL;  Surgeon: Midge Minium, MD;  Location: [facility] ENDOSCOPY;  Service: Endoscopy;  Laterality: N/A;   ESOPHAGOGASTRODUODENOSCOPY (EGD) WITH PROPOFOL N/A 01/24/2018   Procedure: ESOPHAGOGASTRODUODENOSCOPY (EGD) WITH PROPOFOL;  Surgeon: Midge Minium, MD;  Location: [facility] ENDOSCOPY;  Service: Endoscopy;  Laterality: N/A;   HIP ARTHROPLASTY Left 06/03/2019   Procedure: ARTHROPLASTY BIPOLAR HIP (HEMIARTHROPLASTY);  Surgeon: Donato Heinz, MD;  Location: [facility] ORS;  Service: Orthopedics;  Laterality: Left;   HIP ARTHROPLASTY Right 04/21/2020   Procedure: ARTHROPLASTY BIPOLAR HIP (HEMIARTHROPLASTY);  Surgeon: Christena Flake, MD;  Location: [facility]  ORS;  Service: Orthopedics;  Laterality: Right;     A IV Location/Drains/Wounds Patient Lines/Drains/Airways Status     Active Line/Drains/Airways     Name Placement date Placement time Site Days   Peripheral IV 10/18/23 20 G Anterior;Right Forearm 10/18/23  0812  Forearm  less than 1   Pressure Injury 06/03/19 Buttocks Right;Left;Posterior;Upper Deep  Tissue Injury - Purple or maroon localized area of discolored intact skin or blood-filled blister due to damage of underlying soft tissue from pressure and/or shear. 06/03/19  1157  -- 1598   Wound / Incision (Open or Dehisced) Head Medial --  --  Head  --            Intake/Output Last 24 hours  Intake/Output Summary (Last 24 hours) at 10/18/2023 1936 Last data filed at 10/18/2023 1610 Gross per 24 hour  Intake 100 ml  Output --  Net 100 ml    Labs/Imaging Results for orders placed or performed during the hospital encounter of 10/18/23 (from the past 48 hour(s))  CBC with Differential     Status: Abnormal   Collection Time: 10/18/23  5:29 AM  Result Value Ref Range   WBC 6.1 4.0 - 10.5 K/uL   RBC 3.95 3.87 - 5.11 MIL/uL   Hemoglobin 10.6 (L) 12.0 - 15.0 g/dL   HCT 96.0 (L) 45.4 - 09.8 %   MCV 85.1 80.0 - 100.0 fL   MCH 26.8 26.0 - 34.0 pg   MCHC 31.5 30.0 - 36.0 g/dL   RDW 11.9 14.7 - 82.9 %   Platelets 220 150 - 400 K/uL   nRBC 0.0 0.0 - 0.2 %   Neutrophils Relative % 58 %   Neutro Abs 3.5 1.7 - 7.7 K/uL   Lymphocytes Relative 27 %   Lymphs Abs 1.7 0.7 - 4.0 K/uL   Monocytes Relative 10 %   Monocytes Absolute 0.6 0.1 - 1.0 K/uL   Eosinophils Relative 4 %   Eosinophils Absolute 0.3 0.0 - 0.5 K/uL   Basophils Relative 1 %   Basophils Absolute 0.1 0.0 - 0.1 K/uL   Immature Granulocytes 0 %   Abs Immature Granulocytes 0.01 0.00 - 0.07 K/uL    Comment: Performed at [facility], [address] [place], [place] [address]  Comprehensive metabolic panel     Status: Abnormal   Collection Time: 10/18/23  5:29 AM  Result Value Ref Range   Sodium 135 135 - 145 mmol/L   Potassium 4.0 3.5 - 5.1 mmol/L   Chloride 100 98 - 111 mmol/L   CO2 28 22 - 32 mmol/L   Glucose, Bld 217 (H) 70 - 99 mg/dL    Comment: Glucose reference range applies only to samples taken after fasting for at least 8 hours.   BUN 22 8 - 23 mg/dL   Creatinine, Ser 0.86 0.44 - 1.00 mg/dL   Calcium  9.1 8.9 - 57.8 mg/dL   Total Protein 6.1 (L) 6.5 - 8.1 g/dL   Albumin 3.1 (L) 3.5 - 5.0 g/dL   AST 17 15 - 41 U/L   ALT 12 0 - 44 U/L   Alkaline Phosphatase 82 38 - 126 U/L   Total Bilirubin 0.7 <1.2 mg/dL   GFR, Estimated 58 (L) >60 mL/min    Comment: (NOTE) Calculated using the CKD-EPI Creatinine Equation (2021)    Anion gap 7 5 - 15    Comment: Performed at [facility], [address], [place], [place] [address]  Urinalysis, w/ Reflex to  Culture (Infection Suspected) -Urine, Clean Catch     Status: Abnormal   Collection Time: 10/18/23  7:20 AM  Result Value Ref Range   Specimen Source URINE, CLEAN CATCH    Color, Urine YELLOW (A) YELLOW   APPearance CLOUDY (A) CLEAR   Specific Gravity, Urine 1.003 (L) 1.005 - 1.030   pH 7.0 5.0 - 8.0   Glucose, UA 50 (A) NEGATIVE mg/dL   Hgb urine dipstick SMALL (A) NEGATIVE   Bilirubin Urine NEGATIVE NEGATIVE   Ketones, ur NEGATIVE NEGATIVE mg/dL   Protein, ur 30 (A) NEGATIVE mg/dL   Nitrite NEGATIVE NEGATIVE   Leukocytes,Ua LARGE (A) NEGATIVE   RBC / HPF 6-10 0 - 5 RBC/hpf   WBC, UA >50 0 - 5 WBC/hpf    Comment:        Reflex urine culture not performed if WBC <=10, OR if Squamous epithelial cells >5. If Squamous epithelial cells >5 suggest recollection.    Bacteria, UA MANY (A) NONE SEEN   Squamous Epithelial / HPF 6-10 0 - 5 /HPF   WBC Clumps PRESENT     Comment: Performed at [facility], [address] [place], [place] [address]   US Venous Img Lower  Left (DVT Study)  Result Date: 10/18/2023 CLINICAL DATA:  Left lower extremity edema and erythema. History prior left lower extremity DVT. EXAM: LEFT LOWER EXTREMITY VENOUS DOPPLER ULTRASOUND TECHNIQUE: Gray-scale sonography with graded compression, as well as color Doppler and duplex ultrasound were performed to evaluate the lower extremity deep venous systems from the level of the common femoral vein and including the common femoral, femoral, profunda femoral,  popliteal and calf veins including the posterior tibial, peroneal and gastrocnemius veins when visible. The superficial great saphenous vein was also interrogated. Spectral Doppler was utilized to evaluate flow at rest and with distal augmentation maneuvers in the common femoral, femoral and popliteal veins. COMPARISON:  11/13/2018 FINDINGS: Contralateral Common Femoral Vein: Respiratory phasicity is normal and symmetric with the symptomatic side. No evidence of thrombus. Normal compressibility. Common Femoral Vein: Stable nonocclusive echogenic mural thrombus and web formation related to prior thrombus. Good flow is present. Saphenofemoral Junction: No evidence of thrombus. Normal compressibility and flow on color Doppler imaging. Profunda Femoral Vein: No evidence of thrombus. Normal compressibility and flow on color Doppler imaging. Femoral Vein: Stable echogenic nonocclusive mural thrombus consistent with chronic thrombus. Good flow is present in the patent central lumen. Popliteal Vein: No evidence of thrombus. Normal compressibility, respiratory phasicity and response to augmentation. Calf Veins: Probable nonocclusive thrombus in the visualized posterior tibial vein and potentially occlusive thrombus in the visualized peroneal vein which appear new since 2019 and may be consistent with acute calf vein thrombus. Superficial Great Saphenous Vein: No evidence of thrombus. Normal compressibility. Venous Reflux:  None. Other Findings: No evidence of superficial thrombophlebitis or abnormal fluid collection. IMPRESSION: 1. Probable acute calf vein thrombus with nonocclusive thrombus in the left posterior tibial vein and potentially occlusive thrombus in the visualized left peroneal vein. 2. Stable chronic nonocclusive mural thrombus in the left common femoral and femoral veins. Electronically Signed   By: Irish Lack M.D.   On: 10/18/2023 08:14    Pending Labs Unresulted Labs (From admission, onward)      Start     Ordered   10/19/23 0500  CBC  Tomorrow morning,   R        10/18/23 1551   10/18/23 0941  Urine Culture (for pregnant, neutropenic or urologic patients or  patients with an indwelling urinary catheter)  (Urine Labs)  Add-on,   AD       Question:  Indication  Answer:  Dysuria   10/18/23 0940            Vitals/Pain Today's Vitals   10/18/23 1700 10/18/23 1730 10/18/23 1830 10/18/23 1900  BP: (!) 153/64 (!) 178/70 (!) 173/59 (!) 151/77  Pulse: 68 65  91  Resp:    16  Temp:      TempSrc:      SpO2: 98% 100% 98% 98%  Weight:      Height:      PainSc:    Asleep    Isolation Precautions No active isolations  Medications Medications  sodium chloride flush (NS) 0.9 % injection 3 mL (3 mLs Intravenous Given 10/18/23 1037)  oxyCODONE (Oxy IR/ROXICODONE) immediate release tablet 5 mg (5 mg Oral Given 10/18/23 1036)  morphine (PF) 2 MG/ML injection 1 mg (has no administration in time range)  bisacodyl (DULCOLAX) EC tablet 5 mg (has no administration in time range)  cefTRIAXone (ROCEPHIN) 1 g in sodium chloride 0.9 % 100 mL IVPB (has no administration in time range)  metoprolol succinate (TOPROL-XL) 24 hr tablet 25 mg (has no administration in time range)  benazepril (LOTENSIN) tablet 20 mg (has no administration in time range)  LORazepam (ATIVAN) tablet 0.5 mg (has no administration in time range)  enoxaparin (LOVENOX) injection 60 mg (has no administration in time range)  cefTRIAXone (ROCEPHIN) 1 g in sodium chloride 0.9 % 100 mL IVPB (0 g Intravenous Stopped 10/18/23 0842)  enoxaparin (LOVENOX) injection 60 mg (60 mg Subcutaneous Given 10/18/23 1037)  amLODipine (NORVASC) tablet 10 mg (10 mg Oral Given 10/18/23 1317)    Mobility non-ambulatory     Focused Assessments Cardiac Assessment Handoff:  Cardiac Rhythm: Atrial fibrillation Lab Results  Component Value Date   CKTOTAL 31 (L) 12/03/2016   CKMB 2.4 03/28/2014   TROPONINI <0.03 08/08/2016   No results found  for: "DDIMER" Does the Patient currently have chest pain? No    R Recommendations: See Admitting Provider Note  Report given to:   Additional Notes:

## 2023-10-19 ENCOUNTER — Other Ambulatory Visit (HOSPITAL_COMMUNITY): Payer: Self-pay

## 2023-10-19 ENCOUNTER — Encounter: Payer: Self-pay | Admitting: Internal Medicine

## 2023-10-19 DIAGNOSIS — I82402 Acute embolism and thrombosis of unspecified deep veins of left lower extremity: Secondary | ICD-10-CM | POA: Diagnosis not present

## 2023-10-19 DIAGNOSIS — N3001 Acute cystitis with hematuria: Secondary | ICD-10-CM | POA: Diagnosis not present

## 2023-10-19 DIAGNOSIS — K5909 Other constipation: Secondary | ICD-10-CM | POA: Diagnosis not present

## 2023-10-19 DIAGNOSIS — K5900 Constipation, unspecified: Secondary | ICD-10-CM

## 2023-10-19 DIAGNOSIS — R531 Weakness: Secondary | ICD-10-CM

## 2023-10-19 DIAGNOSIS — N182 Chronic kidney disease, stage 2 (mild): Secondary | ICD-10-CM

## 2023-10-19 DIAGNOSIS — I10 Essential (primary) hypertension: Secondary | ICD-10-CM

## 2023-10-19 DIAGNOSIS — E1122 Type 2 diabetes mellitus with diabetic chronic kidney disease: Secondary | ICD-10-CM

## 2023-10-19 LAB — CBC
HCT: 37.8 % (ref 36.0–46.0)
Hemoglobin: 11.8 g/dL — ABNORMAL LOW (ref 12.0–15.0)
MCH: 25.9 pg — ABNORMAL LOW (ref 26.0–34.0)
MCHC: 31.2 g/dL (ref 30.0–36.0)
MCV: 83.1 fL (ref 80.0–100.0)
Platelets: 184 10*3/uL (ref 150–400)
RBC: 4.55 MIL/uL (ref 3.87–5.11)
RDW: 13.7 % (ref 11.5–15.5)
WBC: 5.9 10*3/uL (ref 4.0–10.5)
nRBC: 0 % (ref 0.0–0.2)

## 2023-10-19 MED ORDER — APIXABAN 5 MG PO TABS
10.0000 mg | ORAL_TABLET | Freq: Two times a day (BID) | ORAL | Status: DC
  Administered 2023-10-19 – 2023-10-21 (×4): 10 mg via ORAL
  Filled 2023-10-19 (×4): qty 2

## 2023-10-19 MED ORDER — APIXABAN 5 MG PO TABS: 5.0000 mg | ORAL_TABLET | Freq: Two times a day (BID) | ORAL | Status: DC

## 2023-10-19 MED ORDER — LUBIPROSTONE 8 MCG PO CAPS
8.0000 ug | ORAL_CAPSULE | Freq: Two times a day (BID) | ORAL | Status: DC
  Administered 2023-10-19 – 2023-10-21 (×4): 8 ug via ORAL
  Filled 2023-10-19 (×5): qty 1

## 2023-10-19 NOTE — Assessment & Plan Note (Signed)
On Amitiza

## 2023-10-19 NOTE — Assessment & Plan Note (Addendum)
On Toprol and benazepril

## 2023-10-19 NOTE — Hospital Course (Addendum)
89 yo Female with past medical history of atrial fibrillation, hypertension, type 2 diabetes mellitus, COVID double cholelithiasis presented with weakness and left leg swelling.  Found to have a DVT.  Started on Lovenox injections.  12/4.  Patient declined rehab.  Lives at independent living.  Hospice following.  Switched over to [employer]. 12/5.  Walked to the nursing station with physical therapy today.  Urine culture growing Pseudomonas, E. coli and Enterococcus.  Rocephin switched over to fosfomycin. 12/6.  Discharge back to independent living with hospice following.

## 2023-10-19 NOTE — Consult Note (Signed)
PHARMACY - ANTICOAGULATION CONSULT NOTE  Pharmacy Consult for apixaban Indication: DVT  Allergies  Allergen Reactions   Sulfa Antibiotics Swelling   Celecoxib Nausea And Vomiting   Acetaminophen Itching   Codeine Rash   Lyrica [Pregabalin] Rash   Penicillin G Rash    Has patient had a PCN reaction causing immediate rash, facial/tongue/throat swelling, SOB or lightheadedness with hypotension: Yes Has patient had a PCN reaction causing severe rash involving mucus membranes or skin necrosis: No Has patient had a PCN reaction that required hospitalization: No Has patient had a PCN reaction occurring within the last 10 years: Unknown If all of the above answers are "NO", then may proceed with Cephalosporin use.   Petrolatum-Zinc Oxide Rash   Patient Measurements: Height: 5\' 4"  (162.6 cm) Weight: 57 kg (125 lb 10.6 oz) IBW/kg (Calculated) : 54.7  Vital Signs: Temp: 97.8 F (36.6 C) (12/04 0812) Temp Source: Oral (12/04 0452) BP: 152/75 (12/04 0942) Pulse Rate: 89 (12/04 0942)  Labs: Recent Labs    10/18/23 0529 10/19/23 0533  HGB 10.6* 11.8*  HCT 33.6* 37.8  PLT 220 184  CREATININE 0.91  --    Estimated Creatinine Clearance: 31.9 mL/min (by C-G formula based on SCr of 0.91 mg/dL).  Medical History: Past Medical History:  Diagnosis Date   Atrial fibrillation (HCC)    Bronchitis    Cancer (HCC)    Left leg growth, kidneys, lungs and breasts   Carcinoma of unknown primary (HCC)    COPD (chronic obstructive pulmonary disease) (HCC)    Diabetes mellitus without complication (HCC)    Hyperlipidemia    Hypertension    Pancreatitis    Pneumonia    Stroke (HCC)    TIA's   Vitamin B12 deficiency    Medications:  Received Lovenox 60 mg (1 mg/kg) last dose at 0944 10/19/23  Assessment: Jillienne Dobbs is a 89 year old female that has presented with swelling and redness of the left lower leg. PMH is significant for atrial fibrillation, HTN, and T2DM, choledocholithiasis,  and likely dementia. Patient does have a history of DVT and during her most recent hospital admission her Eliquis was discontinued. She is a current hospice patient.   Patient was given one time treatment dose of Lovenox this morning. Pharmacy has been consulted for management of Lovenox for DVT. Ultrasound of the LLE shows probable acute calf vein thrombus with nonocclusive thrombus in the left posterior tibial vein and potentially occlusive thrombus in the left peroneal vein as well as a stable chronic nonocclusive mural thrombus in the left common femoral and femoral veins.   Goal of Therapy:  Monitor platelets by anticoagulation protocol: Yes   Plan:  ---Start apixaban 10 mg po BID x 7 days, then 5 mg po BID thereafter (begin apixaban dose when next scheduled enoxaparin dose scheduled) ---Monitor CBC at least every 72 hours while on Lovenox   Lowella Bandy, PharmD 10/19/2023 11:09 AM

## 2023-10-19 NOTE — Assessment & Plan Note (Addendum)
Urine culture growing 3 organisms with Pseudomonas being one of them and Enterococcus and E. coli being another.  Patient received a dose of fosfomycin yesterday.  Case discussed with ID pharmacist and since the patient did receive Rocephin here and fosfomycin no further antibiotics needed.

## 2023-10-19 NOTE — Assessment & Plan Note (Addendum)
Continue Eliquis.  E scribed to her pharmacy.  Pharmacist to give 30-day free card.  Risk of bleeding explained to patient.

## 2023-10-19 NOTE — Progress Notes (Signed)
  Progress Note   Patient: Monica [name] ZOX:[id number] DOB: 18-Sep-1926 DOA: 10/18/2023     1 DOS: the patient was seen and examined on 10/19/2023   Brief hospital course: 89 yo Female with past medical history of atrial fibrillation, hypertension, type 2 diabetes mellitus, COVID double cholelithiasis presented with weakness and left leg swelling.  Found to have a DVT.  Started on Lovenox injections.  12/4.  Patient declined rehab.  Lives at assisted living.  Hospice following.  Assessment and Plan: * DVT (deep venous thrombosis) (HCC) Lovenox injections switched over to Eliquis.  Constipation On Amitiza  Type 2 diabetes mellitus with chronic kidney disease, without long-term current use of insulin (HCC) Type 2 diabetes mellitus with chronic kidney disease stage II.  Patient is diet controlled.  Acute cystitis with hematuria On Rocephin.  Follow-up urine culture  Essential hypertension On Toprol and benazepril        Subjective: Patient feels okay.  Offers no complaints.  Admitted with DVT.  Physical Exam: Vitals:   10/19/23 0452 10/19/23 0812 10/19/23 0942 10/19/23 1557  BP: (!) 156/63 (!) 152/75 (!) 152/75 (!) 149/65  Pulse: 82 89 89 66  Resp: 20 17  17   Temp: 97.9 F (36.6 C) 97.8 F (36.6 C)  98.7 F (37.1 C)  TempSrc: Oral   Oral  SpO2: 96% 96%  97%  Weight:      Height:       Physical Exam HENT:     Head: Normocephalic.     Mouth/Throat:     Pharynx: No oropharyngeal exudate.  Eyes:     General: Lids are normal.     Conjunctiva/sclera: Conjunctivae normal.  Cardiovascular:     Rate and Rhythm: Normal rate and regular rhythm.     Heart sounds: Normal heart sounds, S1 normal and S2 normal.  Pulmonary:     Breath sounds: No decreased breath sounds, wheezing, rhonchi or rales.  Abdominal:     Palpations: Abdomen is soft.     Tenderness: There is no abdominal tenderness.  Musculoskeletal:     Right lower leg: No swelling.     Left lower leg: Swelling  present.  Skin:    General: Skin is warm.     Findings: No rash.  Neurological:     Mental Status: She is alert.     Data Reviewed: Creatinine 0.91, hemoglobin 11.8 Ultrasound showing probable calf vein thrombus with nonocclusive thrombus in left posterior tibial and left peroneal vein.  Stable chronic nonocclusive mural thrombus in left common femoral vein Family Communication: Updated patient's daughter on the phone  Disposition: Status is: Inpatient Remains inpatient appropriate because: Family concerned about patient's safety.  Patient lives in independent living and wants to return.  Physical therapy recommending rehab.  Patient started on a blood thinner.  Planned Discharge Destination: Potential rehab versus assisted living with home health.    Time spent: 28 minutes Spoke with TOC and hospice  Author: Alford Highland, MD 10/19/2023 5:03 PM  For on call review [email or web address].

## 2023-10-19 NOTE — Assessment & Plan Note (Signed)
Patient declined rehab.  Patient walked better today than yesterday.  Walked 50 feet with physical therapy to the nursing station.

## 2023-10-19 NOTE — TOC Initial Note (Signed)
Transition of Care [facility]) - Initial/Assessment Note    Patient Details  Name: Monica [name] MRN: [id number] Date of Birth: January 28, 1926  Transition [facility] CM/SW Contact:    Margarito Liner, LCSW Phone Number: 10/19/2023, 2:47 PM  Clinical Narrative:  Readmission prevention screen complete. CSW met with patient. Long-time friend at bedside. CSW introduced role and explained that discharge planning would be discussed. PCP is Sallyanne Kuster, NP. One of her daughters or the ILF bus transports her to appointments. She uses [facility]. No issues obtaining medications. Patient lives at [facility] ILF. She is active with [facility] for home hospice services. She has a rollator, wheelchair, shower chair, and elevated toilet seat at home. PT is recommending SNF placement but she declined. CSW encouraged her to notify [facility] team if she changes her mind. No further concerns. CSW encouraged patient to contact CSW as needed. CSW will continue to follow patient for support and facilitate return home once stable. Patient stated she will have a ride home at discharge.  Expected Discharge Plan: Home w Hospice Care Barriers to Discharge: Continued Medical Work up   Patient Goals and CMS Choice            Expected Discharge Plan and Services     Post Acute Care Choice: Resumption of Svcs/PTA Provider Living arrangements for the past 2 months: Independent Living Facility                                      Prior Living Arrangements/Services Living arrangements for the past 2 months: Independent Living Facility Lives with:: Self Patient language and need for interpreter reviewed:: Yes Do you feel safe going back to the place where you live?: Yes      Need for Family Participation in Patient Care: Yes (Comment)   Current home services: DME, Hospice Criminal Activity/Legal Involvement Pertinent to Current Situation/Hospitalization: No - Comment as needed  Activities of Daily  Living   ADL Screening (condition at time of admission) Independently performs ADLs?: No Does the patient have a NEW difficulty with bathing/dressing/toileting/self-feeding that is expected to last >3 days?: Yes (Initiates electronic notice to provider for possible OT consult) Does the patient have a NEW difficulty with getting in/out of bed, walking, or climbing stairs that is expected to last >3 days?: Yes (Initiates electronic notice to provider for possible PT consult) Does the patient have a NEW difficulty with communication that is expected to last >3 days?: No Is the patient deaf or have difficulty hearing?: No Does the patient have difficulty seeing, even when wearing glasses/contacts?: No Does the patient have difficulty concentrating, remembering, or making decisions?: No  Permission Sought/Granted Permission sought to share information with : Facility Industrial/product designer granted to share information with : Yes, Verbal Permission Granted     Permission granted to share info w AGENCY: [facility]        Emotional Assessment Appearance:: Appears stated age Attitude/Demeanor/Rapport: Engaged, Gracious Affect (typically observed): Appropriate, Calm, Pleasant Orientation: : Oriented to Self, Oriented to Place, Oriented to  Time, Oriented to Situation Alcohol / Substance Use: Not Applicable Psych Involvement: No (comment)  Admission diagnosis:  Leg swelling [M79.89] DVT (deep venous thrombosis) (HCC) [I82.409] Generalized weakness [R53.1] Patient Active Problem List   Diagnosis Date Noted   DVT (deep venous thrombosis) (HCC) 10/18/2023   End of life care 08/19/2023   Choledocholithiasis 08/19/2023  Severe sepsis with acute organ dysfunction (HCC) 08/19/2023   Septic shock (HCC) 08/19/2023   History of tobacco use 08/19/2023   GI bleed 04/13/2021   Right hip pain 11/02/2020   S/P hip replacement, right 11/02/2020   Need for vaccination against Streptococcus  pneumoniae using pneumococcal conjugate vaccine 13 11/02/2020   Microcytic anemia    DOE (dyspnea on exertion) 09/09/2020   Acute on chronic anemia 09/08/2020   Fracture of femoral neck, right (HCC) 04/19/2020   Diabetes mellitus without complication (HCC) 04/19/2020   Fall at home, initial encounter 04/19/2020   Supratherapeutic INR 11/22/2019   Scalp hematoma, subsequent encounter 11/17/2019   Scalp laceration, subsequent encounter 10/26/2019   Muscle cramping 10/26/2019   Long term current use of anticoagulant therapy 10/26/2019   Encounter for general adult medical examination with abnormal findings 08/21/2019   Cellulitis of right buttock 07/25/2019   Diabetic neuropathy (HCC) 06/25/2019   Type 2 diabetes mellitus with vascular disease (HCC) 06/25/2019   Pressure injury of skin 06/05/2019   'Light-for-dates' infant with signs of fetal malnutrition 05/31/2019   Closed left hip fracture (HCC) 05/31/2019   Inflammatory polyarthritis (HCC) 05/09/2019   Deep vein thrombosis (DVT) of non-extremity vein 11/29/2018   Edema of left lower extremity 11/13/2018   Cellulitis of left lower leg 11/01/2018   Encounter for therapeutic drug level monitoring 07/05/2018   Allergic contact dermatitis 04/19/2018   Atopic dermatitis 03/15/2018   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   Atrial fibrillation, chronic (HCC) 02/12/2018   Melena    Urinary tract infection with hematuria 01/04/2018   Chronic obstructive pulmonary disease (HCC) 12/27/2017   Acute cystitis with hematuria 12/27/2017   Dysuria 12/27/2017   Urinary frequency 12/27/2017   Uncontrolled type 2 diabetes mellitus with hyperglycemia (HCC) 12/27/2017   Iron deficiency anemia due to chronic blood loss 09/28/2016   Avitaminosis D 09/09/2016   B12 deficiency 09/09/2016   Temporary cerebral vascular dysfunction 09/09/2016   Spinal stenosis 09/09/2016   Pain in shoulder 09/09/2016   Restless legs syndrome 09/09/2016   Personal  history of urinary calculi 09/09/2016   H/O deep venous thrombosis 09/09/2016   Gout 09/09/2016   Accumulation of fluid in tissues 09/09/2016   Carpal tunnel syndrome 09/09/2016   Chronic lung disease 09/09/2016   Cataract 09/09/2016   Appendicular ataxia 09/09/2016   Airway hyperreactivity 09/09/2016   Rectal bleeding    Acute blood loss anemia    Gastrointestinal hemorrhage 08/08/2016   Carcinoma of unknown primary (HCC) 08/02/2016   Diabetes (HCC) 05/08/2015   Arthropathy of hand 02/04/2010   Hypertonicity of bladder 08/31/2009   Detrusor instability of bladder 06/18/2009   Pure hypercholesterolemia 04/10/2009   Essential hypertension 04/10/2009   Diverticulitis of colon 04/10/2009   PCP:  Sallyanne Kuster, NP Pharmacy:   [facility] - [place], [place] - [address] [place] [address] Phone: [phone] Fax: [phone]     Social Determinants of Health (SDOH) Social History: SDOH Screenings   Food Insecurity: No Food Insecurity (10/18/2023)  Housing: Low Risk  (10/18/2023)  Transportation Needs: No Transportation Needs (10/18/2023)  Utilities: Not At Risk (10/18/2023)  Alcohol Screen: Low Risk  (04/20/2022)  Depression (PHQ2-9): Low Risk  (12/01/2022)  Tobacco Use: Medium Risk (10/18/2023)   SDOH Interventions:     Readmission Risk Interventions    10/19/2023    2:47 PM 10/18/2023   11:18 AM  Readmission Risk Prevention Plan  Transportation Screening Complete Complete  PCP  or Specialist Appt within 3-5 Days Complete   HRI or Home Care Consult Complete Complete  Social Work Consult for Recovery Care Planning/Counseling Complete Complete  Palliative Care Screening Complete Complete  Medication Review Oceanographer) Complete

## 2023-10-19 NOTE — Plan of Care (Signed)

## 2023-10-19 NOTE — Assessment & Plan Note (Addendum)
Type 2 diabetes mellitus with chronic kidney disease stage II.  Patient is diet controlled.

## 2023-10-19 NOTE — Progress Notes (Signed)
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 Patient presented to ED after pressing her life alert due to left leg swelling and pain. [facility] was not notified of this ED visit until called by hospital staff. She is admitted to the hospital as of 12.3.24 with diagnosis of UTI and DVT. Per Dr. Patric [name] with [facility] this is a related hospital admission.   Patient drinking her coffee upon visiting today. She reports that she still has a great deal of pain to her leg but knows that it will likely take a little while for it to start feeling better. Patient reports that she is tired of her family telling her what to do and it is her plan to return to her home once she is ready to leave the hospital. Attempted to discuss that she might need to consider the possibility that she may need a higher level of care but she does not want to discuss this. We discussed continuation of medication for DVT at discharge which she is agreeable to.    Patient is inpatient appropriate with need for diagnosis/treatment of DVT and IV treatment of UTI.  Vital Signs- 97.8/88/17     152/75    spO2 96% room air I&O- not recorded/100 Abnormal Labs- no new abnormal labs, urine culture pending Diagnostics-  None new  IV/PRN Meds- Rocephin 1g q24H, starting Eliquis Problem List as per progress note Dr. Fabienne [name] 12.3.24 LLE DVT: continue on lovenox.  UTI: UA was positive. Continue on IV rocephin. Urine cx is pending  Likely dementia: continue w/ supportive care  HTN: restart home dose of metoprolol, benazepril tomorrow AM   Anxiety: severity unknown. Ativan prn Discharge Planning- Ongoing, likely d/c to [place] facility once medically stable Family Contact- None, patient is alert and able to speak for herself IDT: updated Goals of Care: DNR  Thank you for the opportunity to  participate in this patient's care, please don't hesitate to call for any hospice related questions or concerns.  Monica [name] BSN, [facility] Liaison [phone]

## 2023-10-19 NOTE — Evaluation (Signed)
Physical Therapy Evaluation Patient Details Name: Monica [name] MRN: [id number] DOB: [birth year] Today's Date: 10/19/2023  History of Present Illness  Pt is 89 y/o admitted 10/18/23 for DVT. S/Sx associated involve weakness and LLE swelling. PmHx includes: COPD, DM, hyperlipidemia, dementia and HTN.   Clinical Impression  Pt received in bed and agreed to PT session. Pt reports fear with movement due to pain. Pt performed bed mobility MaxA due to pain and declined further mobility. Pt attempted to perform supine>sit and rolling>sidelying>sit, however was severely limited by pain. Due to pain, pt did not complete bed mobility as pt presented with difficulty to turn left/right or move BLE's from reported pain in bilat shoulders, back, and LLE. Pt did not tolerate Tx well and will continue to benefit from skilled PT sessions to improve strength, functional mobility, and activity tolerance to maximize safety/IND following D/C.      If plan is discharge home, recommend the following: A lot of help with walking and/or transfers;A lot of help with bathing/dressing/bathroom;Help with stairs or ramp for entrance;Assist for transportation;Supervision due to cognitive status   Can travel by private vehicle   No    Equipment Recommendations Other (comment) (TBD)  Recommendations for Other Services       Functional Status Assessment Patient has had a recent decline in their functional status and demonstrates the ability to make significant improvements in function in a reasonable and predictable amount of time.     Precautions / Restrictions Precautions Precautions: Fall Restrictions Weight Bearing Restrictions: No      Mobility  Bed Mobility Overal bed mobility: Needs Assistance Bed Mobility: Supine to Sit     Supine to sit: Max assist, HOB elevated     General bed mobility comments: Pt required MaxA for bed mobility. Due to severe pain, pt unable to complete bed mobility.     Transfers                   General transfer comment: Due to pain, pt declined further mobility    Ambulation/Gait               General Gait Details: due to pain, pt deferred further mobility  Stairs            Wheelchair Mobility     Tilt Bed    Modified Rankin (Stroke Patients Only)       Balance Overall balance assessment:  (Not observed)                                           Pertinent Vitals/Pain Pain Assessment Pain Assessment: Faces Faces Pain Scale: Hurts whole lot Pain Location: Back, Shoulders, LLE Pain Intervention(s): Limited activity within patient's tolerance    Home Living Family/patient expects to be discharged to:: Assisted living                        Prior Function Prior Level of Function : Needs assist             Mobility Comments: Pt reports using a WC at baseline. Pt describes that she performs a STS and step pivot transfer to sit into her WC. ADLs Comments: Pt reports having assistance with bathing and dressing. Pt is IND with toileting and feeding.     Extremity/Trunk Assessment   Upper Extremity Assessment Upper Extremity Assessment: Generalized  weakness    Lower Extremity Assessment Lower Extremity Assessment: Generalized weakness;LLE deficits/detail LLE Deficits / Details: DVT       Communication   Communication Communication: Hearing impairment Cueing Techniques: Verbal cues;Tactile cues  Cognition Arousal: Alert Behavior During Therapy: Anxious Overall Cognitive Status: History of cognitive impairments - at baseline                                 General Comments: Pt pleasant and willing to participate in PT session with encourgement. Pt expresses fear with mobility due to pain.        General Comments      Exercises     Assessment/Plan    PT Assessment Patient needs continued PT services  PT Problem List Decreased strength;Decreased  activity tolerance;Decreased balance;Decreased mobility;Pain;Decreased cognition       PT Treatment Interventions DME instruction;Gait training;Functional mobility training;Therapeutic activities;Therapeutic exercise;Balance training    PT Goals (Current goals can be found in the Care Plan section)  Acute Rehab PT Goals Patient Stated Goal: No goals stated PT Goal Formulation: With patient Time For Goal Achievement: 11/02/23 Potential to Achieve Goals: Fair    Frequency Min 1X/week     Co-evaluation               AM-PAC PT "6 Clicks" Mobility  Outcome Measure Help needed turning from your back to your side while in a flat bed without using bedrails?: Total Help needed moving from lying on your back to sitting on the side of a flat bed without using bedrails?: Total Help needed moving to and from a bed to a chair (including a wheelchair)?: Total Help needed standing up from a chair using your arms (e.g., wheelchair or bedside chair)?: Total Help needed to walk in hospital room?: Total Help needed climbing 3-5 steps with a railing? : Total 6 Click Score: 6    End of Session   Activity Tolerance: Patient limited by pain Patient left: in bed;with call bell/phone within reach;with bed alarm set Nurse Communication: Mobility status PT Visit Diagnosis: Unsteadiness on feet (R26.81);Other abnormalities of gait and mobility (R26.89);Difficulty in walking, not elsewhere classified (R26.2);Pain Pain - Right/Left: Left Pain - part of body: Leg    Time: 1030-1045 PT Time Calculation (min) (ACUTE ONLY): 15 min   Charges:   PT Evaluation $PT Eval Low Complexity: 1 Low   PT General Charges $$ ACUTE PT VISIT: 1 Visit         Monica [name] SPT, LAT, ATC  Monica [name] 10/19/2023, 1:21 PM

## 2023-10-19 NOTE — Evaluation (Signed)
Occupational Therapy Evaluation Patient Details Name: Monica [name] MRN: [id number] DOB: 1926/02/25 Today's Date: 10/19/2023   History of Present Illness Pt is 89 y/o admitted 10/18/23 for DVT. S/Sx associated involve weakness and LLE swelling. PmHx includes: COPD, DM, hyperlipidemia, dementia and HTN.   Clinical Impression   Pt was seen for OT evaluation this date. Prior to hospital admission, pt reports needing A with bathing and dressing. Pt is IND with toileting and feeding. Pt lives in ALF. Pt presents to acute OT demonstrating impaired ADL performance and functional mobility 2/2 (See OT problem list for additional functional deficits). Upon arrival to room pt supine in bed, agreeable to tx with maximal encouragement. Pt reported 10/10 pain with movement and little to no pain at rest. Pt activity limited d/t pain. Pt noted to be confused and disoriented to place.   Pt required maximal encouragement and OT initiated bed mobility with Total A. Pt returned BLE back into bed with supervision refusing any further mobility. Pt very agitated during session and refusing pain medications at this time. Pt wanted to be left alone. Pt left supine in bed with call bell within reach and all needs met. Pt would benefit from skilled OT services to address noted impairments and functional limitations (see below for any additional details) in order to maximize safety and independence while minimizing falls risk and caregiver burden. Anticipate the need for follow up OT services upon acute hospital DC.        If plan is discharge home, recommend the following: A lot of help with bathing/dressing/bathroom;A lot of help with walking and/or transfers;Supervision due to cognitive status;Assist for transportation;Assistance with cooking/housework;Help with stairs or ramp for entrance    Functional Status Assessment  Patient has had a recent decline in their functional status and demonstrates the ability to make  significant improvements in function in a reasonable and predictable amount of time.  Equipment Recommendations  Other (comment) (Defer to next venue of care)    Recommendations for Other Services       Precautions / Restrictions Precautions Precautions: Fall Restrictions Weight Bearing Restrictions: No      Mobility Bed Mobility Overal bed mobility: Needs Assistance Bed Mobility: Supine to Sit     Supine to sit: Total assist     General bed mobility comments: OT initiated bed mobility w/ Total A. Due to severe pain, pt unable to complete bed mobility.    Transfers                          Balance                                           ADL either performed or assessed with clinical judgement   ADL Overall ADL's : Needs assistance/impaired                                     Functional mobility during ADLs: Supervision/safety;Total assistance General ADL Comments: Pt reported 40/98 pain with movement and little to no pain at rest. Pt activity limited d/t pain. Pt required maximal encourgement and OT initiated bed mobility with Total A. Pt returned BLE back into bed with supervision refusing any further mobility. Pt agitated during session and refusing pain medications at this time.  Vision         Perception         Praxis         Pertinent Vitals/Pain Pain Assessment Pain Assessment: 0-10 Pain Score: 10-Worst pain ever Pain Location: Back, Shoulders, LLE Pain Descriptors / Indicators: Grimacing, Tightness, Discomfort Pain Intervention(s): Limited activity within patient's tolerance, Monitored during session     Extremity/Trunk Assessment Upper Extremity Assessment Upper Extremity Assessment: Generalized weakness   Lower Extremity Assessment Lower Extremity Assessment: Generalized weakness;LLE deficits/detail LLE Deficits / Details: DVT       Communication Communication Communication: Hearing  impairment Cueing Techniques: Verbal cues;Tactile cues   Cognition Arousal: Alert Behavior During Therapy: Agitated Overall Cognitive Status: History of cognitive impairments - at baseline                                       General Comments       Exercises     Shoulder Instructions      Home Living Family/patient expects to be discharged to:: Assisted living                                 Additional Comments: Resident of [facility]F      Prior Functioning/Environment Prior Level of Function : Needs assist             Mobility Comments: Pt reports using a WC at baseline. Pt describes that she performs a STS and step pivot transfer to sit into her WC. ADLs Comments: Pt reports having assistance with bathing and dressing. Pt is IND with toileting and feeding.        OT Problem List: Decreased strength;Decreased range of motion;Decreased activity tolerance;Impaired balance (sitting and/or standing);Decreased knowledge of use of DME or AE;Decreased safety awareness      OT Treatment/Interventions: Therapeutic exercise;Therapeutic activities;Self-care/ADL training;DME and/or AE instruction;Patient/family education;Energy conservation    OT Goals(Current goals can be found in the care plan section) Acute Rehab OT Goals Patient Stated Goal: to go home OT Goal Formulation: With patient Time For Goal Achievement: 11/02/23 Potential to Achieve Goals: Fair  OT Frequency: Min 1X/week    Co-evaluation              AM-PAC OT "6 Clicks" Daily Activity     Outcome Measure Help from another person eating meals?: None Help from another person taking care of personal grooming?: A Little Help from another person toileting, which includes using toliet, bedpan, or urinal?: A Lot Help from another person bathing (including washing, rinsing, drying)?: Total Help from another person to put on and taking off regular upper body clothing?: A  Little Help from another person to put on and taking off regular lower body clothing?: Total 6 Click Score: 14   End of Session    Activity Tolerance: Patient limited by pain Patient left: in bed;with call bell/phone within reach;with bed alarm set  OT Visit Diagnosis: Muscle weakness (generalized) (M62.81);Other abnormalities of gait and mobility (R26.89)                Time: 0347-4259 OT Time Calculation (min): 10 min Charges:     Butch Penny, SOT

## 2023-10-20 DIAGNOSIS — R531 Weakness: Secondary | ICD-10-CM | POA: Diagnosis not present

## 2023-10-20 DIAGNOSIS — Z8673 Personal history of transient ischemic attack (TIA), and cerebral infarction without residual deficits: Secondary | ICD-10-CM

## 2023-10-20 DIAGNOSIS — I10 Essential (primary) hypertension: Secondary | ICD-10-CM | POA: Diagnosis not present

## 2023-10-20 DIAGNOSIS — I82402 Acute embolism and thrombosis of unspecified deep veins of left lower extremity: Secondary | ICD-10-CM | POA: Diagnosis not present

## 2023-10-20 DIAGNOSIS — N3001 Acute cystitis with hematuria: Secondary | ICD-10-CM | POA: Diagnosis not present

## 2023-10-20 MED ORDER — FOSFOMYCIN TROMETHAMINE 3 G PO PACK
3.0000 g | PACK | ORAL | Status: DC
  Administered 2023-10-20: 3 g via ORAL
  Filled 2023-10-20: qty 3

## 2023-10-20 NOTE — Progress Notes (Signed)
Physical Therapy Treatment Patient Details Name: Monica [name] MRN: [id number] DOB: [birth year] Today's Date: 10/20/2023   History of Present Illness Pt is 89 y/o admitted 10/18/23 for DVT. S/Sx associated involve weakness and LLE swelling. PmHx includes: COPD, DM, hyperlipidemia, dementia and HTN.    PT Comments  Pt was long sitting in bed upon arrival. She greets Chartered loss adjuster and actual remembered Thereasa Parkin from a previous admission. Pt does follow commands however author questions overall awareness of her current deficits. Poor safety awareness noted. She is HOH but was agreeable/cooperative to OOB activity. She requires assistance to safely exit bed, stand, and tolerate ambulation ~ 50 ft with rollator. Trends to ambulate with narrow BOS with occasion scissoring. Is at high fall risk due to balance deficits. Chartered loss adjuster discussed post acute DC disposition. Pt did not want to go to STR, she states she would like to go back to [facility] and do her therapy there(HHPT). TOC/MD aware. Acute PT will continue to follow per current POC to      If plan is discharge home, recommend the following: A little help with walking and/or transfers;A lot of help with bathing/dressing/bathroom;Assistance with cooking/housework;Direct supervision/assist for medications management;Assist for transportation;Help with stairs or ramp for entrance;Supervision due to cognitive status     Equipment Recommendations  None recommended by PT       Precautions / Restrictions Precautions Precautions: Fall Restrictions Weight Bearing Restrictions: No     Mobility  Bed Mobility Overal bed mobility: Needs Assistance Bed Mobility: Supine to Sit  Supine to sit: Mod assist, HOB elevated, Used rails  General bed mobility comments: increased time + mod assist to safely progress to EOB sitting    Transfers Overall transfer level: Needs assistance Equipment used: Rollator (4 wheels) (pt unwilling to use RW, but was agreeable to use  rollator) Transfers: Sit to/from Stand Sit to Stand: Contact guard assist, Min assist  General transfer comment: Min assist to stand first attempt however CGA to stand 2-3rd attempts from recliner. vcs for handplacement and technique improvements    Ambulation/Gait Ambulation/Gait assistance: Min assist, Contact guard assist Gait Distance (Feet): 50 Feet Assistive device: Rollator (4 wheels) Gait Pattern/deviations: Narrow base of support Gait velocity: decreased  General Gait Details: Pt was able to ambulate ~ 50 ft with rollator. vcs for posture correction and to widen BOS for improved balance   Balance Overall balance assessment: Needs assistance Sitting-balance support: Feet supported Sitting balance-Leahy Scale: Fair     Standing balance support: Bilateral upper extremity supported, During functional activity, Reliant on assistive device for balance Standing balance-Leahy Scale: Poor Standing balance comment: pt is at high risk of falls     Cognition Arousal: Alert Behavior During Therapy: WFL for tasks assessed/performed Overall Cognitive Status: History of cognitive impairments - at baseline    General Comments: Pt is A but does present with some disorientation. She remembered author from a previous admission ~ 3 years prior. Pt agrees to OOB activity           General Comments General comments (skin integrity, edema, etc.): reviewed importance of call bell and not getting up I'ly. she states understanding however chair alarm in place and RN staff aware      Pertinent Vitals/Pain Pain Assessment Pain Assessment: 0-10 Pain Score: 4  Pain Location: RLE thigh Pain Descriptors / Indicators: Grimacing, Tightness, Discomfort Pain Intervention(s): Limited activity within patient's tolerance, Monitored during session, Repositioned, Premedicated before session     PT Goals (current goals can now be  found in the care plan section) Acute Rehab PT Goals Patient Stated Goal:  go back to my home [facility] Progress towards PT goals: Progressing toward goals    Frequency    Min 1X/week       AM-PAC PT "6 Clicks" Mobility   Outcome Measure  Help needed turning from your back to your side while in a flat bed without using bedrails?: A Little Help needed moving from lying on your back to sitting on the side of a flat bed without using bedrails?: A Lot Help needed moving to and from a bed to a chair (including a wheelchair)?: A Lot Help needed standing up from a chair using your arms (e.g., wheelchair or bedside chair)?: A Little Help needed to walk in hospital room?: A Little Help needed climbing 3-5 steps with a railing? : A Lot 6 Click Score: 15    End of Session         PT Visit Diagnosis: Unsteadiness on feet (R26.81);Other abnormalities of gait and mobility (R26.89);Difficulty in walking, not elsewhere classified (R26.2);Pain Pain - Right/Left: Right Pain - part of body: Leg     Time: 4010-2725 PT Time Calculation (min) (ACUTE ONLY): 20 min  Charges:    $Therapeutic Activity: 8-22 mins PT General Charges $$ ACUTE PT VISIT: 1 Visit                     Jetta Lout PTA 10/20/23, 1:51 PM

## 2023-10-20 NOTE — TOC Progression Note (Signed)
Transition of Care [facility]) - Progression Note    Patient Details  Name: Monica [name] MRN: [id number] Date of Birth: 20-May-1926  Transition of [facility]) CM/SW Contact  Chapman Fitch, RN Phone Number: 10/20/2023, 6:46 PM  Clinical Narrative:     Met with patient at bedside.  Patient confirms she is not willing to consider STR.  She requests that her daughter not be called to assist with making decisions.   Patient requests to work with therapy again to show that she is mobile.  She states at baseline she uses her WC and ambulates  Patient was able to ambulate to nurses station with RW  2 messages sent to [facility] to review  Expected Discharge Plan: Home w Hospice Care Barriers to Discharge: Continued Medical Work up  Expected Discharge Plan and Services     Post Acute Care Choice: Resumption of Svcs/PTA Provider Living arrangements for the past 2 months: Independent Living Facility                                       Social Determinants of Health (SDOH) Interventions SDOH Screenings   Food Insecurity: No Food Insecurity (10/18/2023)  Housing: Low Risk  (10/18/2023)  Transportation Needs: No Transportation Needs (10/18/2023)  Utilities: Not At Risk (10/18/2023)  Alcohol Screen: Low Risk  (04/20/2022)  Depression (PHQ2-9): Low Risk  (12/01/2022)  Tobacco Use: Medium Risk (10/18/2023)    Readmission Risk Interventions    10/19/2023    2:47 PM 10/18/2023   11:18 AM  Readmission Risk Prevention Plan  Transportation Screening Complete Complete  PCP or Specialist Appt within 3-5 Days Complete   HRI or Home Care Consult Complete Complete  Social Work Consult for Recovery Care Planning/Counseling Complete Complete  Palliative Care Screening Complete Complete  Medication Review [employer]) Complete

## 2023-10-20 NOTE — Progress Notes (Signed)
  Progress Note   Patient: Monica [name] ZOX:[id number] DOB: 12-11-1925 DOA: 10/18/2023     2 DOS: the patient was seen and examined on 10/20/2023   Brief hospital course: 89 yo Female with past medical history of atrial fibrillation, hypertension, type 2 diabetes mellitus, COVID double cholelithiasis presented with weakness and left leg swelling.  Found to have a DVT.  Started on Lovenox injections.  12/4.  Patient declined rehab.  Lives at independent living.  Hospice following.  Switched over to [employer]. 12/5.  Walked to the nursing station with physical therapy today.  Still awaiting urine culture.  Assessment and Plan: * DVT (deep venous thrombosis) (HCC) Continue Eliquis.  Risk of bleeding explained to patient.  Acute cystitis with hematuria Urine culture growing 3 organisms with Pseudomonas being one of them and Enterococcus and E. coli being another.  Switch antibiotics over to Zosyn until sensitivities back.  May be a candidate for fosfomycin.  Essential hypertension On Toprol and benazepril  Generalized weakness Patient declined rehab.  Patient walked better today than yesterday.  Walked 50 feet with physical therapy to the nursing station.  Type 2 diabetes mellitus with chronic kidney disease, without long-term current use of insulin (HCC) Type 2 diabetes mellitus with chronic kidney disease stage II.  Patient is diet controlled.  Constipation On Amitiza  History of stroke History of paroxysmal atrial fibrillation.  Eliquis will help prevent stroke.       Subjective: Patient feels okay.  Does not want to go to rehab and wants to go back to her independent living.  Walked better today with physical therapy.  Physical Exam: Vitals:   10/19/23 1950 10/20/23 0400 10/20/23 0852 10/20/23 0910  BP: (!) 135/51 (!) 138/42 (!) 147/61 (!) 147/61  Pulse:   63 63  Resp: 17     Temp: 98.6 F (37 C) 98.7 F (37.1 C) 98.1 F (36.7 C)   TempSrc: Oral Oral Oral   SpO2: 97% 97%  94%   Weight:      Height:       Physical Exam HENT:     Head: Normocephalic.     Mouth/Throat:     Pharynx: No oropharyngeal exudate.  Eyes:     General: Lids are normal.     Conjunctiva/sclera: Conjunctivae normal.  Cardiovascular:     Rate and Rhythm: Normal rate and regular rhythm.     Heart sounds: Normal heart sounds, S1 normal and S2 normal.  Pulmonary:     Breath sounds: No decreased breath sounds, wheezing, rhonchi or rales.  Abdominal:     Palpations: Abdomen is soft.     Tenderness: There is no abdominal tenderness.  Musculoskeletal:     Right lower leg: No swelling.     Left lower leg: Swelling present.  Skin:    General: Skin is warm.     Findings: No rash.  Neurological:     Mental Status: She is alert.     Data Reviewed: Creatinine 0.91, white blood cell count 5.9, hemoglobin 11.8, platelet count 184  Family Communication: Patient declined me speaking with family today she wanted me to speak with her.  Disposition: Status is: Inpatient Remains inpatient appropriate because: TOC to see if her independent living will take her back.  Awaiting sensitivities on urine culture.  Planned Discharge Destination: Likely back to independent living with hospice    Time spent: 27 minutes  Author: Alford Highland, MD 10/20/2023 3:08 PM  For on call review [email or web address].

## 2023-10-20 NOTE — Plan of Care (Signed)

## 2023-10-20 NOTE — Assessment & Plan Note (Signed)
History of paroxysmal atrial fibrillation.  Eliquis will help prevent stroke.

## 2023-10-20 NOTE — Progress Notes (Signed)
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 Patient presented to ED after pressing her life alert due to left leg swelling and pain. [facility] was not notified of this ED visit until called by hospital staff. She is admitted to the hospital as of 12.3.24 with diagnosis of UTI and DVT. Per Dr. Patric Dykes with [facility] this is a related hospital admission.    Patient sitting up in bed eating a banana.  Patient was alert and oriented.  She stated that she is feeling better, but it's hard to tell.  Patient reported that she lives in an efficiency apartment at [facility] off of [address]  She said that she plans to return there at discharge.     Patient is inpatient appropriate with need for diagnosis/treatment of DVT and IV treatment of UTI.   Vital Signs- 98.1,  63, 17,  spO2 94% room air I&O- not recorded Abnormal Labs- None new.  Waiting for UC results.        Diagnostics-  None new  IV/PRN Meds- Rocephin 1g q24H, oxycodone 5mg  q 4hr prn po x1, starting Eliquis  Problem List:  DVT (deep venous thrombosis) (HCC) Continue Eliquis.  Risk of bleeding explained to patient.   Acute cystitis with hematuria Urine culture growing 3 organisms with Pseudomonas being one of them and Enterococcus and E. coli being another.  Switch antibiotics over to Zosyn until sensitivities back.  May be a candidate for fosfomycin.   Essential hypertension On Toprol and benazepril   Generalized weakness Patient declined rehab.  Patient walked better today than yesterday.  Walked 50 feet with physical therapy to the nursing station.   Type 2 diabetes mellitus with chronic kidney disease, without long-term current use of insulin (HCC) Type 2 diabetes mellitus with chronic kidney disease stage II.  Patient is diet controlled.   Constipation On Amitiza   History  of stroke History of paroxysmal atrial fibrillation.  Eliquis will help prevent stroke.    Discharge Planning- Ongoing.  Patient to work with PT today and tomorrow morning to see if she will improve with her mobility.  TOC informed her that she would need to be more ambulatory in order to return to [facility].   Family Contact- None, patient is alert and able to speak for herself.  Spoke with patient at the bedside today.   IDT: updated Goals of Care: DNR  Thank you for the opportunity to participate in this patient's care, please don't hesitate to call for any hospice related questions or concerns.   [facility] Liaison [phone]

## 2023-10-21 DIAGNOSIS — N3001 Acute cystitis with hematuria: Secondary | ICD-10-CM | POA: Diagnosis not present

## 2023-10-21 DIAGNOSIS — E1122 Type 2 diabetes mellitus with diabetic chronic kidney disease: Secondary | ICD-10-CM | POA: Diagnosis not present

## 2023-10-21 DIAGNOSIS — I82402 Acute embolism and thrombosis of unspecified deep veins of left lower extremity: Secondary | ICD-10-CM | POA: Diagnosis not present

## 2023-10-21 DIAGNOSIS — I10 Essential (primary) hypertension: Secondary | ICD-10-CM | POA: Diagnosis not present

## 2023-10-21 LAB — URINE CULTURE: Culture: 30000 — AB

## 2023-10-21 MED ORDER — BENAZEPRIL HCL 20 MG PO TABS: 20.0000 mg | ORAL_TABLET | Freq: Every day | ORAL | 0 refills | Status: DC

## 2023-10-21 MED ORDER — APIXABAN 5 MG PO TABS: ORAL_TABLET | ORAL | 0 refills | Status: AC

## 2023-10-21 NOTE — Discharge Summary (Signed)
Physician Discharge Summary   Patient: Monica [name] MRN: [id number] DOB: 14-Aug-1926  Admit date:     10/18/2023  Discharge date: 10/21/23  Discharge Physician: Alford Highland   PCP: Sallyanne Kuster, NP   Recommendations at discharge:   Follow-up with hospice 1 day Follow-up PCP  Discharge Diagnoses: Principal Problem:   DVT (deep venous thrombosis) (HCC) Active Problems:   Acute cystitis with hematuria   Essential hypertension   Generalized weakness   Type 2 diabetes mellitus with chronic kidney disease, without long-term current use of insulin (HCC)   Constipation   History of stroke    Hospital Course: 89 yo Female with past medical history of atrial fibrillation, hypertension, type 2 diabetes mellitus, COVID double cholelithiasis presented with weakness and left leg swelling.  Found to have a DVT.  Started on Lovenox injections.  12/4.  Patient declined rehab.  Lives at independent living.  Hospice following.  Switched over to [employer]. 12/5.  Walked to the nursing station with physical therapy today.  Urine culture growing Pseudomonas, E. coli and Enterococcus.  Rocephin switched over to fosfomycin. 12/6.  Discharge back to independent living with hospice following.   Assessment and Plan: * DVT (deep venous thrombosis) (HCC) Continue Eliquis.  E scribed to her pharmacy.  Pharmacist to give 30-day free card.  Risk of bleeding explained to patient.  Acute cystitis with hematuria Urine culture growing 3 organisms with Pseudomonas being one of them and Enterococcus and E. coli being another.  Patient received a dose of fosfomycin yesterday.  Case discussed with ID pharmacist and since the patient did receive Rocephin here and fosfomycin no further antibiotics needed.  Essential hypertension On Toprol and benazepril  Generalized weakness Patient declined rehab.  Patient walked better today than yesterday.  Walked 50 feet with physical therapy to the nursing  station.  Type 2 diabetes mellitus with chronic kidney disease, without long-term current use of insulin (HCC) Type 2 diabetes mellitus with chronic kidney disease stage II.  Patient is diet controlled.  Constipation On Amitiza  History of stroke History of paroxysmal atrial fibrillation.  Eliquis will help prevent stroke.        Consultants: Hospice, physical therapy Procedures performed: None Disposition: Back to her independent living with hospice following.  Patient refused to rehab Diet recommendation:  Cardiac diet DISCHARGE MEDICATION: Allergies as of 10/21/2023       Reactions   Sulfa Antibiotics Swelling   Celecoxib Nausea And Vomiting   Acetaminophen Itching   Codeine Rash   Lyrica [pregabalin] Rash   Penicillin G Rash   Has patient had a PCN reaction causing immediate rash, facial/tongue/throat swelling, SOB or lightheadedness with hypotension: Yes Has patient had a PCN reaction causing severe rash involving mucus membranes or skin necrosis: No Has patient had a PCN reaction that required hospitalization: No Has patient had a PCN reaction occurring within the last 10 years: Unknown If all of the above answers are "NO", then may proceed with Cephalosporin use.   Petrolatum-zinc Oxide Rash        Medication List     STOP taking these medications    meloxicam 7.5 MG tablet Commonly known as: MOBIC       TAKE these medications    albuterol 108 (90 Base) MCG/ACT inhaler Commonly known as: VENTOLIN HFA INHALE TWO PUFFS INTO THE LUNGS EVERY 4 HOURS AS NEEDED FOR WHEEZING OR SHORTNESS OF BREATH   antiseptic oral rinse Liqd 15 mLs by Mouth Rinse route as needed for  dry mouth.   apixaban 5 MG Tabs tablet Commonly known as: ELIQUIS Take 2 tablets (10 mg total) by mouth 2 (two) times daily for 5 days, THEN 1 tablet (5 mg total) 2 (two) times daily for 25 days. Start taking on: October 21, 2023   benazepril 20 MG tablet Commonly known as: LOTENSIN Take  1 tablet (20 mg total) by mouth daily. Start taking on: October 22, 2023   haloperidol 1 MG tablet Commonly known as: HALDOL Take 1 tablet (1 mg total) by mouth every 8 (eight) hours as needed for agitation.   LORazepam 0.5 MG tablet Commonly known as: Ativan Take 1 tablet (0.5 mg total) by mouth every 8 (eight) hours as needed for anxiety.   lubiprostone 8 MCG capsule Commonly known as: AMITIZA Take 1 capsule (8 mcg total) by mouth 2 (two) times daily with a meal.   metoprolol succinate 25 MG 24 hr tablet Commonly known as: TOPROL-XL Take 25 mg by mouth daily.   morphine 20 MG/5ML solution Take 1.3-2.5 mLs (5.2-10 mg total) by mouth every 2 (two) hours as needed (pain or air hunger).   nystatin powder Generic drug: nystatin APPLY 1 APPLICATION TOPICALLY 3 TIMES DAILY TO GROIN AREA UNTIL RASH RESOLVES   ondansetron 4 MG disintegrating tablet Commonly known as: ZOFRAN-ODT Take 1 tablet (4 mg total) by mouth every 6 (six) hours as needed for nausea.   polyvinyl alcohol 1.4 % ophthalmic solution Commonly known as: LIQUIFILM TEARS Place 1 drop into both eyes 4 (four) times daily as needed for dry eyes.        Follow-up Information     Sallyanne Kuster, NP Follow up on 10/24/2023.   Specialty: Nurse Practitioner Why: Go at 3:20 Pm will see the PA Contact information: [address] [place] [address] [phone]                Discharge Exam: Filed Weights   10/18/23 1001  Weight: 57 kg   Physical Exam HENT:     Head: Normocephalic.     Mouth/Throat:     Pharynx: No oropharyngeal exudate.  Eyes:     General: Lids are normal.     Conjunctiva/sclera: Conjunctivae normal.  Cardiovascular:     Rate and Rhythm: Normal rate and regular rhythm.     Heart sounds: Normal heart sounds, S1 normal and S2 normal.  Pulmonary:     Breath sounds: No decreased breath sounds, wheezing, rhonchi or rales.  Abdominal:     Palpations: Abdomen is soft.      Tenderness: There is no abdominal tenderness.  Musculoskeletal:     Right lower leg: No swelling.     Left lower leg: Swelling present.  Skin:    General: Skin is warm.     Findings: No rash.  Neurological:     Mental Status: She is alert.      Condition at discharge: stable  The results of significant diagnostics from this hospitalization (including imaging, microbiology, ancillary and laboratory) are listed below for reference.   Imaging Studies: US Venous Img Lower  Left (DVT Study)  Result Date: 10/18/2023 CLINICAL DATA:  Left lower extremity edema and erythema. History prior left lower extremity DVT. EXAM: LEFT LOWER EXTREMITY VENOUS DOPPLER ULTRASOUND TECHNIQUE: Gray-scale sonography with graded compression, as well as color Doppler and duplex ultrasound were performed to evaluate the lower extremity deep venous systems from the level of the common femoral vein and including the common femoral, femoral, profunda femoral, popliteal and  calf veins including the posterior tibial, peroneal and gastrocnemius veins when visible. The superficial great saphenous vein was also interrogated. Spectral Doppler was utilized to evaluate flow at rest and with distal augmentation maneuvers in the common femoral, femoral and popliteal veins. COMPARISON:  11/13/2018 FINDINGS: Contralateral Common Femoral Vein: Respiratory phasicity is normal and symmetric with the symptomatic side. No evidence of thrombus. Normal compressibility. Common Femoral Vein: Stable nonocclusive echogenic mural thrombus and web formation related to prior thrombus. Good flow is present. Saphenofemoral Junction: No evidence of thrombus. Normal compressibility and flow on color Doppler imaging. Profunda Femoral Vein: No evidence of thrombus. Normal compressibility and flow on color Doppler imaging. Femoral Vein: Stable echogenic nonocclusive mural thrombus consistent with chronic thrombus. Good flow is present in the patent central  lumen. Popliteal Vein: No evidence of thrombus. Normal compressibility, respiratory phasicity and response to augmentation. Calf Veins: Probable nonocclusive thrombus in the visualized posterior tibial vein and potentially occlusive thrombus in the visualized peroneal vein which appear new since 2019 and may be consistent with acute calf vein thrombus. Superficial Great Saphenous Vein: No evidence of thrombus. Normal compressibility. Venous Reflux:  None. Other Findings: No evidence of superficial thrombophlebitis or abnormal fluid collection. IMPRESSION: 1. Probable acute calf vein thrombus with nonocclusive thrombus in the left posterior tibial vein and potentially occlusive thrombus in the visualized left peroneal vein. 2. Stable chronic nonocclusive mural thrombus in the left common femoral and femoral veins. Electronically Signed   By: Irish Lack M.D.   On: 10/18/2023 08:14    Microbiology: Results for orders placed or performed during the hospital encounter of 10/18/23  Urine Culture (for pregnant, neutropenic or urologic patients or patients with an indwelling urinary catheter)     Status: Abnormal   Collection Time: 10/18/23  7:20 AM   Specimen: Urine, Clean Catch  Result Value Ref Range Status   Specimen Description   Final    URINE, CLEAN CATCH Performed at [facility], [address], [place], [place] [address]    Special Requests   Final    NONE Performed at [facility], [address] [place], [place] [address]    Culture (A)  Final    30,000 COLONIES/mL ENTEROCOCCUS FAECALIS 30,000 COLONIES/mL ESCHERICHIA COLI 70,000 COLONIES/mL PSEUDOMONAS AERUGINOSA    Report Status 10/21/2023 FINAL  Final   Organism ID, Bacteria ESCHERICHIA COLI (A)  Final   Organism ID, Bacteria PSEUDOMONAS AERUGINOSA (A)  Final   Organism ID, Bacteria ENTEROCOCCUS FAECALIS (A)  Final      Susceptibility   Escherichia coli - MIC*    AMPICILLIN >=32 RESISTANT Resistant      CEFAZOLIN 8 SENSITIVE Sensitive     CEFEPIME <=0.12 SENSITIVE Sensitive     CEFTRIAXONE <=0.25 SENSITIVE Sensitive     CIPROFLOXACIN >=4 RESISTANT Resistant     GENTAMICIN <=1 SENSITIVE Sensitive     IMIPENEM <=0.25 SENSITIVE Sensitive     NITROFURANTOIN <=16 SENSITIVE Sensitive     TRIMETH/SULFA >=320 RESISTANT Resistant     AMPICILLIN/SULBACTAM >=32 RESISTANT Resistant     PIP/TAZO 64 INTERMEDIATE Intermediate ug/mL    * 30,000 COLONIES/mL ESCHERICHIA COLI   Enterococcus faecalis - MIC*    AMPICILLIN <=2 SENSITIVE Sensitive     NITROFURANTOIN <=16 SENSITIVE Sensitive     VANCOMYCIN 1 SENSITIVE Sensitive     * 30,000 COLONIES/mL ENTEROCOCCUS FAECALIS   Pseudomonas aeruginosa - MIC*    CEFTAZIDIME 2 SENSITIVE Sensitive     CIPROFLOXACIN <=0.25 SENSITIVE Sensitive  GENTAMICIN <=1 SENSITIVE Sensitive     IMIPENEM 2 SENSITIVE Sensitive     PIP/TAZO <=4 SENSITIVE Sensitive ug/mL    CEFEPIME 2 SENSITIVE Sensitive     * 70,000 COLONIES/mL PSEUDOMONAS AERUGINOSA    Labs: CBC: Recent Labs  Lab 10/18/23 0529 10/19/23 0533  WBC 6.1 5.9  NEUTROABS 3.5  --   HGB 10.6* 11.8*  HCT 33.6* 37.8  MCV 85.1 83.1  PLT 220 184   Basic Metabolic Panel: Recent Labs  Lab 10/18/23 0529  NA 135  K 4.0  CL 100  CO2 28  GLUCOSE 217*  BUN 22  CREATININE 0.91  CALCIUM 9.1   Liver Function Tests: Recent Labs  Lab 10/18/23 0529  AST 17  ALT 12  ALKPHOS 82  BILITOT 0.7  PROT 6.1*  ALBUMIN 3.1*   CBG: No results for input(s): "GLUCAP" in the last 168 hours.  Discharge time spent: greater than 30 minutes.  Signed: Alford Highland, MD [facility] 10/21/2023

## 2023-10-21 NOTE — Plan of Care (Signed)

## 2023-10-21 NOTE — Consult Note (Signed)
[facility] Liaison Note  10/21/2023  Monica [name] August 26, 1926 [id number]  Location: RN Hospital Liaison screened the patient remotely at [facility].  Insurance: Medicare   Monica [name] is a 89 y.o. female who is a Primary Care Patient of Abernathy, Arlyss Repress, NP with [facility].The patient was screened for  readmission hospitalization with noted high risk score for unplanned readmission risk with 2 IP/2 ED in 6 months.  The patient was assessed for potential Care Management service needs for post hospital transition for care coordination. Review of patient's electronic medical record reveals patient was admitted with DVT (deep venous thrombosis). Pt is a hospice patient with [facility]. No anticipated needs via VBCI. Pt will discharge back to [facility].    VBCI Care Management/Population Health does not replace or interfere with any arrangements made by the Inpatient Transition of Care team.   For questions contact:   Elliot Cousin, RN, [facility] Liaison [place]   [facility], Population Health Office Hours MTWF  8:00 am-6:00 pm Direct Dial: [phone] mobile [phone] [Office toll free line] Office Hours are M-F 8:30 - 5 pm [name]

## 2023-10-21 NOTE — TOC Transition Note (Signed)
Transition of Care [facility]) - CM/SW Discharge Note   Patient Details  Name: Monica [name] MRN: [id number] Date of Birth: 05-31-1926  Transition of Care [facility]) CM/SW Contact:  Chapman Fitch, RN Phone Number: 10/21/2023, 11:16 AM   Clinical Narrative:      Patient to discharge today back to [facility] VM left for [facility] at  [facility] with [employer] notified  Daughter [name] notified per patients request  EMS packet and signed DNR on chart [facility] transport arranged     Barriers to Discharge: Continued Medical Work up   Patient Goals and CMS Choice      Discharge Placement                         Discharge Plan and Services Additional resources added to the After Visit Summary for       Post Acute Care Choice: Resumption of Svcs/PTA Provider                               Social Determinants of Health (SDOH) Interventions SDOH Screenings   Food Insecurity: No Food Insecurity (10/18/2023)  Housing: Low Risk  (10/18/2023)  Transportation Needs: No Transportation Needs (10/18/2023)  Utilities: Not At Risk (10/18/2023)  Alcohol Screen: Low Risk  (04/20/2022)  Depression (PHQ2-9): Low Risk  (12/01/2022)  Tobacco Use: Medium Risk (10/18/2023)     Readmission Risk Interventions    10/19/2023    2:47 PM 10/18/2023   11:18 AM  Readmission Risk Prevention Plan  Transportation Screening Complete Complete  PCP or Specialist Appt within 3-5 Days Complete   HRI or Home Care Consult Complete Complete  Social Work Consult for Recovery Care Planning/Counseling Complete Complete  Palliative Care Screening Complete Complete  Medication Review [employer]) Complete

## 2023-10-21 NOTE — Plan of Care (Signed)
  Problem: Education: Goal: Knowledge of General Education information will improve Description: Including pain rating scale, medication(s)/side effects and non-pharmacologic comfort measures Outcome: Adequate for Discharge   Problem: Health Behavior/Discharge Planning: Goal: Ability to manage health-related needs will improve Outcome: Adequate for Discharge   Problem: Clinical Measurements: Goal: Ability to maintain clinical measurements within normal limits will improve Outcome: Adequate for Discharge Goal: Will remain free from infection Outcome: Adequate for Discharge Goal: Diagnostic test results will improve Outcome: Adequate for Discharge Goal: Respiratory complications will improve Outcome: Adequate for Discharge Goal: Cardiovascular complication will be avoided Outcome: Adequate for Discharge   Problem: Activity: Goal: Risk for activity intolerance will decrease Outcome: Adequate for Discharge   Problem: Nutrition: Goal: Adequate nutrition will be maintained Outcome: Adequate for Discharge   Problem: Coping: Goal: Level of anxiety will decrease Outcome: Adequate for Discharge   Problem: Elimination: Goal: Will not experience complications related to bowel motility Outcome: Adequate for Discharge Goal: Will not experience complications related to urinary retention Outcome: Adequate for Discharge   Problem: Pain Management: Goal: General experience of comfort will improve Outcome: Adequate for Discharge   Problem: Safety: Goal: Ability to remain free from injury will improve Outcome: Adequate for Discharge   Problem: Skin Integrity: Goal: Risk for impaired skin integrity will decrease Outcome: Adequate for Discharge  Pt being transported with ems back to [facility]. Transport team has received all pt belongings and dc instructions

## 2023-10-22 ENCOUNTER — Emergency Department
Admission: EM | Admit: 2023-10-22 | Discharge: 2023-10-22 | Disposition: A | Discharge status: Home / Self Care | Attending: Emergency Medicine | Admitting: Emergency Medicine

## 2023-10-22 ENCOUNTER — Emergency Department

## 2023-10-22 ENCOUNTER — Other Ambulatory Visit: Payer: Self-pay

## 2023-10-22 ENCOUNTER — Encounter: Payer: Self-pay | Admitting: Internal Medicine

## 2023-10-22 ENCOUNTER — Encounter: Payer: Self-pay | Admitting: Radiology

## 2023-10-22 DIAGNOSIS — Z043 Encounter for examination and observation following other accident: Secondary | ICD-10-CM | POA: Diagnosis not present

## 2023-10-22 DIAGNOSIS — R69 Illness, unspecified: Secondary | ICD-10-CM | POA: Diagnosis not present

## 2023-10-22 DIAGNOSIS — R531 Weakness: Secondary | ICD-10-CM | POA: Diagnosis not present

## 2023-10-22 DIAGNOSIS — R634 Abnormal weight loss: Secondary | ICD-10-CM | POA: Diagnosis not present

## 2023-10-22 DIAGNOSIS — I10 Essential (primary) hypertension: Secondary | ICD-10-CM | POA: Insufficient documentation

## 2023-10-22 DIAGNOSIS — Z7901 Long term (current) use of anticoagulants: Secondary | ICD-10-CM | POA: Insufficient documentation

## 2023-10-22 DIAGNOSIS — H04123 Dry eye syndrome of bilateral lacrimal glands: Secondary | ICD-10-CM | POA: Diagnosis not present

## 2023-10-22 DIAGNOSIS — Z743 Need for continuous supervision: Secondary | ICD-10-CM | POA: Diagnosis not present

## 2023-10-22 DIAGNOSIS — N390 Urinary tract infection, site not specified: Secondary | ICD-10-CM | POA: Diagnosis not present

## 2023-10-22 DIAGNOSIS — S32010A Wedge compression fracture of first lumbar vertebra, initial encounter for closed fracture: Secondary | ICD-10-CM | POA: Diagnosis not present

## 2023-10-22 DIAGNOSIS — I672 Cerebral atherosclerosis: Secondary | ICD-10-CM | POA: Diagnosis not present

## 2023-10-22 DIAGNOSIS — R9089 Other abnormal findings on diagnostic imaging of central nervous system: Secondary | ICD-10-CM | POA: Diagnosis not present

## 2023-10-22 DIAGNOSIS — K5730 Diverticulosis of large intestine without perforation or abscess without bleeding: Secondary | ICD-10-CM | POA: Diagnosis not present

## 2023-10-22 DIAGNOSIS — K831 Obstruction of bile duct: Secondary | ICD-10-CM | POA: Diagnosis not present

## 2023-10-22 DIAGNOSIS — D135 Benign neoplasm of extrahepatic bile ducts: Secondary | ICD-10-CM | POA: Diagnosis not present

## 2023-10-22 DIAGNOSIS — R29890 Loss of height: Secondary | ICD-10-CM | POA: Diagnosis not present

## 2023-10-22 DIAGNOSIS — S0083XA Contusion of other part of head, initial encounter: Secondary | ICD-10-CM | POA: Diagnosis not present

## 2023-10-22 DIAGNOSIS — S2241XA Multiple fractures of ribs, right side, initial encounter for closed fracture: Secondary | ICD-10-CM | POA: Diagnosis not present

## 2023-10-22 DIAGNOSIS — M4316 Spondylolisthesis, lumbar region: Secondary | ICD-10-CM | POA: Diagnosis not present

## 2023-10-22 DIAGNOSIS — E119 Type 2 diabetes mellitus without complications: Secondary | ICD-10-CM | POA: Diagnosis not present

## 2023-10-22 DIAGNOSIS — W01198A Fall on same level from slipping, tripping and stumbling with subsequent striking against other object, initial encounter: Secondary | ICD-10-CM | POA: Insufficient documentation

## 2023-10-22 DIAGNOSIS — S199XXA Unspecified injury of neck, initial encounter: Secondary | ICD-10-CM | POA: Diagnosis not present

## 2023-10-22 DIAGNOSIS — I6782 Cerebral ischemia: Secondary | ICD-10-CM | POA: Diagnosis not present

## 2023-10-22 DIAGNOSIS — M47814 Spondylosis without myelopathy or radiculopathy, thoracic region: Secondary | ICD-10-CM | POA: Diagnosis not present

## 2023-10-22 DIAGNOSIS — Z471 Aftercare following joint replacement surgery: Secondary | ICD-10-CM | POA: Diagnosis not present

## 2023-10-22 DIAGNOSIS — Z96643 Presence of artificial hip joint, bilateral: Secondary | ICD-10-CM | POA: Diagnosis not present

## 2023-10-22 DIAGNOSIS — W19XXXA Unspecified fall, initial encounter: Secondary | ICD-10-CM | POA: Diagnosis not present

## 2023-10-22 DIAGNOSIS — M8580 Other specified disorders of bone density and structure, unspecified site: Secondary | ICD-10-CM | POA: Diagnosis not present

## 2023-10-22 DIAGNOSIS — S0990XA Unspecified injury of head, initial encounter: Secondary | ICD-10-CM | POA: Diagnosis not present

## 2023-10-22 DIAGNOSIS — M47816 Spondylosis without myelopathy or radiculopathy, lumbar region: Secondary | ICD-10-CM | POA: Diagnosis not present

## 2023-10-22 NOTE — ED Triage Notes (Signed)
Pt in via EMS from [facility] with c/o fall x's 2 this am. Pt has no complaints or injuries, facility reports pt hit her head when she fell. Pt started eloquis yesterday for a DVT. NO LOC. 172/60, HR 68, 98% RA, CBG 202

## 2023-10-22 NOTE — Progress Notes (Addendum)
This note if for 12.7.2024 4:00pm.  Epic would not let me save the note under that time.    [facility] Hospitalized [facility] Patient  Current hospice followed by hospice at [facility] with terminal diagnosis of abnormal weight loss, who was sent to ED for evaluation after she slid our of her recliner hitting her face and hip.   She was treated and released back to the facility with no traumatic imaging to the head and hip.  She requested d/c back to [facility].    Patient awaiting transport via EMS.  Norris Cross, RN Nurse Liaison [phone]

## 2023-10-22 NOTE — ED Triage Notes (Signed)
PT states that she was trying to get to the bathroom this morning and fell striking her head on the floor. Pt is on eliquis. Pt also talks about recent diagnosis of a blood clot in her left leg. Leg is red below the knee. 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

## 2023-10-22 NOTE — ED Provider Notes (Signed)
[facility] Provider Note    Event Date/Time   First MD Initiated Contact with Patient 10/22/23 1350     (approximate)   History   Fall   HPI  Monica [name] is a 89 y.o. female   Past medical history of fibrillation on Eliquis, hypertension, diabetes, recent hospitalization discharged just yesterday to independent living with hospice following diagnosed with DVT discharged on Eliquis as well as urinary tract infection on fosfomycin who presents to the emergency department with fall.  Patient reports that she slipped out of her recliner and fell striking her face as well as her pelvis.  She reports no loss of consciousness.  She denies any chest pain palpitation shortness of breath or any other associated medical complaints.  Independent Historian contributed to assessment above: I spoke with her daughters on the telephone who note that she keeps to herself, does not want family involved in her care planning, and note that she has some baseline cognitive decline in the recent years with some cognition and memory problems.  External Medical Documents Reviewed: Discharge summary from yesterday for UTI and DVT      Physical Exam   Triage Vital Signs: ED Triage Vitals  Encounter Vitals Group     BP 10/22/23 1213 (!) 145/66     Systolic BP Percentile --      Diastolic BP Percentile --      Pulse Rate 10/22/23 1213 78     Resp 10/22/23 1213 16     Temp 10/22/23 1213 98.7 F (37.1 C)     Temp Source 10/22/23 1213 Oral     SpO2 10/22/23 1213 99 %     Weight 10/22/23 1214 125 lb (56.7 kg)     Height 10/22/23 1214 5\' 4"  (1.626 m)     Head Circumference --      Peak Flow --      Pain Score --      Pain Loc --      Pain Education --      Exclude from Growth Chart --     Most recent vital signs: Vitals:   10/22/23 1213  BP: (!) 145/66  Pulse: 78  Resp: 16  Temp: 98.7 F (37.1 C)  SpO2: 99%    General: Awake, no distress.  CV:  Good  peripheral perfusion.  Resp:  Normal effort.  Abd:  No distention.  Other:  She has a contusion to the right cheek, extraocular movements intact no signs of entrapment.  Neck supple full range of motion and head to toe examination shows no other deformity or marked tenderness to palpation.  She is able to range at her hips bilaterally.   ED Results / Procedures / Treatments   Labs (all labs ordered are listed, but only abnormal results are displayed) Labs Reviewed - No data to display   RADIOLOGY I independently reviewed and interpreted CT of the head and see no obvious bleeding or midline shift I also reviewed radiologist's formal read.   PROCEDURES:  Critical Care performed: No  Procedures   MEDICATIONS ORDERED IN ED: Medications - No data to display    IMPRESSION / MDM / ASSESSMENT AND PLAN / ED COURSE  I reviewed the triage vital signs and the nursing notes.                                Patient's presentation is most consistent  with acute presentation with potential threat to life or bodily function.  Differential diagnosis includes, but is not limited to, traumatic injury including skull fracture, ICH, hip fracture   MDM: Slip and fall with negative traumatic imaging of the head and hips, no other signs of injury elsewhere, no acute medical complaints with discharge yesterday compliant with medications for DVT and UTI.  Per patient's wishes, will be discharged.          FINAL CLINICAL IMPRESSION(S) / ED DIAGNOSES   Final diagnoses:  Fall, initial encounter  Contusion of face, initial encounter     Rx / DC Orders   ED Discharge Orders     None        Note:  This document was prepared using Dragon voice recognition software and may include unintentional dictation errors.    Pilar Jarvis, MD 10/22/23 [phone]

## 2023-10-22 NOTE — Discharge Instructions (Signed)
Fortunately your testing in the emergency department did not show any emergency traumatic injuries to your head or hips.  Continue taking your blood thinner and your antibiotics as prescribed from your recent hospitalization and follow-up with your doctor on Monday as scheduled.  Thank you for choosing [place] for your health care today!  Please see your primary doctor this week for a follow up appointment.   If you have any new, worsening, or unexpected symptoms call your doctor right away or come back to the emergency department for reevaluation.  It was my pleasure to care for you today.   Daneil Dan Modesto Charon, MD

## 2023-10-23 ENCOUNTER — Inpatient Hospital Stay
Admission: EM | Admit: 2023-10-23 | Discharge: 2023-10-29 | DRG: 091 | Disposition: A | Discharge status: Skilled Nursing Facility | Attending: Internal Medicine | Admitting: Internal Medicine

## 2023-10-23 ENCOUNTER — Encounter: Payer: Self-pay | Admitting: Internal Medicine

## 2023-10-23 ENCOUNTER — Other Ambulatory Visit: Payer: Self-pay

## 2023-10-23 ENCOUNTER — Emergency Department

## 2023-10-23 DIAGNOSIS — R9089 Other abnormal findings on diagnostic imaging of central nervous system: Secondary | ICD-10-CM | POA: Diagnosis not present

## 2023-10-23 DIAGNOSIS — R4182 Altered mental status, unspecified: Secondary | ICD-10-CM | POA: Diagnosis not present

## 2023-10-23 DIAGNOSIS — R634 Abnormal weight loss: Secondary | ICD-10-CM | POA: Diagnosis not present

## 2023-10-23 DIAGNOSIS — S32010A Wedge compression fracture of first lumbar vertebra, initial encounter for closed fracture: Secondary | ICD-10-CM

## 2023-10-23 DIAGNOSIS — M4856XA Collapsed vertebra, not elsewhere classified, lumbar region, initial encounter for fracture: Secondary | ICD-10-CM | POA: Diagnosis present

## 2023-10-23 DIAGNOSIS — Z90710 Acquired absence of both cervix and uterus: Secondary | ICD-10-CM

## 2023-10-23 DIAGNOSIS — Z880 Allergy status to penicillin: Secondary | ICD-10-CM

## 2023-10-23 DIAGNOSIS — Z7901 Long term (current) use of anticoagulants: Secondary | ICD-10-CM

## 2023-10-23 DIAGNOSIS — R296 Repeated falls: Principal | ICD-10-CM

## 2023-10-23 DIAGNOSIS — E1122 Type 2 diabetes mellitus with diabetic chronic kidney disease: Secondary | ICD-10-CM | POA: Diagnosis not present

## 2023-10-23 DIAGNOSIS — I480 Paroxysmal atrial fibrillation: Secondary | ICD-10-CM | POA: Insufficient documentation

## 2023-10-23 DIAGNOSIS — Z7189 Other specified counseling: Secondary | ICD-10-CM | POA: Diagnosis not present

## 2023-10-23 DIAGNOSIS — I82452 Acute embolism and thrombosis of left peroneal vein: Secondary | ICD-10-CM

## 2023-10-23 DIAGNOSIS — Z833 Family history of diabetes mellitus: Secondary | ICD-10-CM

## 2023-10-23 DIAGNOSIS — Z96643 Presence of artificial hip joint, bilateral: Secondary | ICD-10-CM | POA: Diagnosis not present

## 2023-10-23 DIAGNOSIS — D135 Benign neoplasm of extrahepatic bile ducts: Secondary | ICD-10-CM | POA: Diagnosis not present

## 2023-10-23 DIAGNOSIS — Z9181 History of falling: Secondary | ICD-10-CM

## 2023-10-23 DIAGNOSIS — I10 Essential (primary) hypertension: Secondary | ICD-10-CM | POA: Diagnosis not present

## 2023-10-23 DIAGNOSIS — Z8673 Personal history of transient ischemic attack (TIA), and cerebral infarction without residual deficits: Secondary | ICD-10-CM

## 2023-10-23 DIAGNOSIS — Z886 Allergy status to analgesic agent status: Secondary | ICD-10-CM

## 2023-10-23 DIAGNOSIS — R739 Hyperglycemia, unspecified: Secondary | ICD-10-CM | POA: Diagnosis not present

## 2023-10-23 DIAGNOSIS — S0990XA Unspecified injury of head, initial encounter: Secondary | ICD-10-CM | POA: Diagnosis not present

## 2023-10-23 DIAGNOSIS — Z809 Family history of malignant neoplasm, unspecified: Secondary | ICD-10-CM

## 2023-10-23 DIAGNOSIS — I824Y9 Acute embolism and thrombosis of unspecified deep veins of unspecified proximal lower extremity: Secondary | ICD-10-CM

## 2023-10-23 DIAGNOSIS — Z885 Allergy status to narcotic agent status: Secondary | ICD-10-CM

## 2023-10-23 DIAGNOSIS — F609 Personality disorder, unspecified: Secondary | ICD-10-CM | POA: Diagnosis present

## 2023-10-23 DIAGNOSIS — K831 Obstruction of bile duct: Secondary | ICD-10-CM | POA: Diagnosis not present

## 2023-10-23 DIAGNOSIS — W19XXXA Unspecified fall, initial encounter: Secondary | ICD-10-CM | POA: Diagnosis not present

## 2023-10-23 DIAGNOSIS — Z87891 Personal history of nicotine dependence: Secondary | ICD-10-CM

## 2023-10-23 DIAGNOSIS — R0689 Other abnormalities of breathing: Secondary | ICD-10-CM | POA: Diagnosis not present

## 2023-10-23 DIAGNOSIS — I129 Hypertensive chronic kidney disease with stage 1 through stage 4 chronic kidney disease, or unspecified chronic kidney disease: Secondary | ICD-10-CM | POA: Diagnosis present

## 2023-10-23 DIAGNOSIS — N390 Urinary tract infection, site not specified: Principal | ICD-10-CM

## 2023-10-23 DIAGNOSIS — E785 Hyperlipidemia, unspecified: Secondary | ICD-10-CM | POA: Diagnosis present

## 2023-10-23 DIAGNOSIS — Z888 Allergy status to other drugs, medicaments and biological substances status: Secondary | ICD-10-CM

## 2023-10-23 DIAGNOSIS — Z66 Do not resuscitate: Secondary | ICD-10-CM | POA: Diagnosis present

## 2023-10-23 DIAGNOSIS — Y92099 Unspecified place in other non-institutional residence as the place of occurrence of the external cause: Secondary | ICD-10-CM

## 2023-10-23 DIAGNOSIS — J449 Chronic obstructive pulmonary disease, unspecified: Secondary | ICD-10-CM | POA: Diagnosis present

## 2023-10-23 DIAGNOSIS — R451 Restlessness and agitation: Secondary | ICD-10-CM | POA: Diagnosis present

## 2023-10-23 DIAGNOSIS — M4316 Spondylolisthesis, lumbar region: Secondary | ICD-10-CM | POA: Diagnosis not present

## 2023-10-23 DIAGNOSIS — S199XXA Unspecified injury of neck, initial encounter: Secondary | ICD-10-CM | POA: Diagnosis not present

## 2023-10-23 DIAGNOSIS — S2241XA Multiple fractures of ribs, right side, initial encounter for closed fracture: Secondary | ICD-10-CM | POA: Diagnosis not present

## 2023-10-23 DIAGNOSIS — Z751 Person awaiting admission to adequate facility elsewhere: Secondary | ICD-10-CM

## 2023-10-23 DIAGNOSIS — I672 Cerebral atherosclerosis: Secondary | ICD-10-CM | POA: Diagnosis not present

## 2023-10-23 DIAGNOSIS — Z79899 Other long term (current) drug therapy: Secondary | ICD-10-CM

## 2023-10-23 DIAGNOSIS — H04123 Dry eye syndrome of bilateral lacrimal glands: Secondary | ICD-10-CM | POA: Diagnosis not present

## 2023-10-23 DIAGNOSIS — G2581 Restless legs syndrome: Secondary | ICD-10-CM | POA: Diagnosis present

## 2023-10-23 DIAGNOSIS — K5730 Diverticulosis of large intestine without perforation or abscess without bleeding: Secondary | ICD-10-CM | POA: Diagnosis not present

## 2023-10-23 DIAGNOSIS — Z043 Encounter for examination and observation following other accident: Secondary | ICD-10-CM | POA: Diagnosis not present

## 2023-10-23 DIAGNOSIS — R29890 Loss of height: Secondary | ICD-10-CM | POA: Diagnosis not present

## 2023-10-23 DIAGNOSIS — G9341 Metabolic encephalopathy: Secondary | ICD-10-CM | POA: Diagnosis present

## 2023-10-23 DIAGNOSIS — I82409 Acute embolism and thrombosis of unspecified deep veins of unspecified lower extremity: Secondary | ICD-10-CM | POA: Diagnosis present

## 2023-10-23 DIAGNOSIS — R609 Edema, unspecified: Secondary | ICD-10-CM | POA: Diagnosis not present

## 2023-10-23 DIAGNOSIS — Z86718 Personal history of other venous thrombosis and embolism: Secondary | ICD-10-CM

## 2023-10-23 DIAGNOSIS — M47814 Spondylosis without myelopathy or radiculopathy, thoracic region: Secondary | ICD-10-CM | POA: Diagnosis not present

## 2023-10-23 DIAGNOSIS — M47816 Spondylosis without myelopathy or radiculopathy, lumbar region: Secondary | ICD-10-CM | POA: Diagnosis not present

## 2023-10-23 DIAGNOSIS — N182 Chronic kidney disease, stage 2 (mild): Secondary | ICD-10-CM | POA: Diagnosis present

## 2023-10-23 DIAGNOSIS — Z882 Allergy status to sulfonamides status: Secondary | ICD-10-CM

## 2023-10-23 DIAGNOSIS — F03918 Unspecified dementia, unspecified severity, with other behavioral disturbance: Secondary | ICD-10-CM | POA: Diagnosis present

## 2023-10-23 HISTORY — DX: Restlessness and agitation: R45.1

## 2023-10-23 LAB — CBC WITH DIFFERENTIAL/PLATELET
Abs Immature Granulocytes: 0.03 10*3/uL (ref 0.00–0.07)
Basophils Absolute: 0.1 10*3/uL (ref 0.0–0.1)
Basophils Relative: 1 %
Eosinophils Absolute: 0 10*3/uL (ref 0.0–0.5)
Eosinophils Relative: 0 %
HCT: 38.2 % (ref 36.0–46.0)
Hemoglobin: 12.3 g/dL (ref 12.0–15.0)
Immature Granulocytes: 0 %
Lymphocytes Relative: 10 %
Lymphs Abs: 0.8 10*3/uL (ref 0.7–4.0)
MCH: 25.8 pg — ABNORMAL LOW (ref 26.0–34.0)
MCHC: 32.2 g/dL (ref 30.0–36.0)
MCV: 80.1 fL (ref 80.0–100.0)
Monocytes Absolute: 0.4 10*3/uL (ref 0.1–1.0)
Monocytes Relative: 5 %
Neutro Abs: 7.2 10*3/uL (ref 1.7–7.7)
Neutrophils Relative %: 84 %
Platelets: 223 10*3/uL (ref 150–400)
RBC: 4.77 MIL/uL (ref 3.87–5.11)
RDW: 13.3 % (ref 11.5–15.5)
WBC: 8.5 10*3/uL (ref 4.0–10.5)
nRBC: 0 % (ref 0.0–0.2)

## 2023-10-23 LAB — BASIC METABOLIC PANEL
Anion gap: 13 (ref 5–15)
BUN: 20 mg/dL (ref 8–23)
CO2: 23 mmol/L (ref 22–32)
Calcium: 8.9 mg/dL (ref 8.9–10.3)
Chloride: 97 mmol/L — ABNORMAL LOW (ref 98–111)
Creatinine, Ser: 0.72 mg/dL (ref 0.44–1.00)
GFR, Estimated: >60 mL/min (ref >60)
Glucose, Bld: 165 mg/dL — ABNORMAL HIGH (ref 70–99)
Potassium: 4.1 mmol/L (ref 3.5–5.1)
Sodium: 133 mmol/L — ABNORMAL LOW (ref 135–145)

## 2023-10-23 MED ORDER — POLYETHYLENE GLYCOL 3350 17 G PO PACK
17.0000 g | PACK | Freq: Every day | ORAL | Status: DC | PRN
  Administered 2023-10-26: 17 g via ORAL
  Filled 2023-10-23: qty 1

## 2023-10-23 MED ORDER — ACETAMINOPHEN 325 MG PO TABS
650.0000 mg | ORAL_TABLET | Freq: Four times a day (QID) | ORAL | Status: DC | PRN
  Filled 2023-10-23 (×3): qty 2

## 2023-10-23 MED ORDER — HALOPERIDOL LACTATE 5 MG/ML IJ SOLN: 1.0000 mg | Freq: Once | INTRAMUSCULAR | Status: DC

## 2023-10-23 MED ORDER — APIXABAN 5 MG PO TABS
10.0000 mg | ORAL_TABLET | Freq: Two times a day (BID) | ORAL | Status: AC
  Administered 2023-10-23 – 2023-10-25 (×5): 10 mg via ORAL
  Filled 2023-10-23 (×5): qty 2

## 2023-10-23 MED ORDER — ONDANSETRON HCL 4 MG PO TABS: 4.0000 mg | ORAL_TABLET | Freq: Four times a day (QID) | ORAL | Status: DC | PRN

## 2023-10-23 MED ORDER — ACETAMINOPHEN 650 MG RE SUPP: 650.0000 mg | Freq: Four times a day (QID) | RECTAL | Status: DC | PRN

## 2023-10-23 MED ORDER — METOPROLOL SUCCINATE ER 25 MG PO TB24
25.0000 mg | ORAL_TABLET | Freq: Every day | ORAL | Status: DC
Start: 2023-10-23 — End: 2023-10-29
  Administered 2023-10-23 – 2023-10-29 (×5): 25 mg via ORAL
  Filled 2023-10-23 (×6): qty 1

## 2023-10-23 MED ORDER — LUBIPROSTONE 8 MCG PO CAPS
8.0000 ug | ORAL_CAPSULE | Freq: Two times a day (BID) | ORAL | Status: DC
  Administered 2023-10-24 – 2023-10-29 (×11): 8 ug via ORAL
  Filled 2023-10-23 (×13): qty 1

## 2023-10-23 MED ORDER — ONDANSETRON HCL 4 MG/2ML IJ SOLN
4.0000 mg | Freq: Four times a day (QID) | INTRAMUSCULAR | Status: DC | PRN
Start: 2023-10-23 — End: 2023-10-29

## 2023-10-23 MED ORDER — ENOXAPARIN SODIUM 40 MG/0.4ML IJ SOSY: 40.0000 mg | PREFILLED_SYRINGE | INTRAMUSCULAR | Status: DC

## 2023-10-23 MED ORDER — LORAZEPAM 0.5 MG PO TABS
0.5000 mg | ORAL_TABLET | Freq: Three times a day (TID) | ORAL | Status: DC | PRN
  Filled 2023-10-23: qty 1

## 2023-10-23 MED ORDER — APIXABAN 5 MG PO TABS
5.0000 mg | ORAL_TABLET | Freq: Two times a day (BID) | ORAL | Status: DC
Start: 2023-10-26 — End: 2023-11-20
  Administered 2023-10-26 (×2): 5 mg via ORAL
  Filled 2023-10-23 (×2): qty 1

## 2023-10-23 NOTE — ED Triage Notes (Signed)
Pt in via EMS from [facility] with c/o unwitnessed fall. Pt pressed pendant for help getting up. Per family pt has been getting treatment for UTI and was seen here a few days ago. Pt has had repeated falls. Pt continues to have an odor of urine and is still taking her meds for it. 75 HR, 94% RA, 20 RR, 170/87, 97.4 axillary temp, CBG 268

## 2023-10-23 NOTE — Assessment & Plan Note (Addendum)
-   Given advanced age, discontinue home Benzapril.  Given suspected poor p.o. intake, high risk for AKI and no benefit of ACE-i at this point.

## 2023-10-23 NOTE — Assessment & Plan Note (Signed)
-   Continue home metoprolol to avoid RVR - Continue home Eliquis

## 2023-10-23 NOTE — ED Notes (Signed)
Advised nurse that patient has ready bed 

## 2023-10-23 NOTE — ED Triage Notes (Signed)
Reports patient fell onto floor this morning.  Recent hospitalization.  Third fall in 3 days.  Patient unable to walk, not new finding.  Patient under hospice palliative care at home.  Bruising to left eye seen.  Patient states she fell onto the lounge chair and couldn't get up by herself.

## 2023-10-23 NOTE — H&P (Signed)
History and Physical    Patient: Monica [name] QMV:[id number] DOB: 10/14/1926 DOA: 10/23/2023 DOS: the patient was seen and examined on 10/23/2023 PCP: Sallyanne Kuster, NP  Patient coming from: Home  Chief Complaint: No chief complaint on file.  HPI: Monica [name] is a 89 y.o. female with medical history significant of COPD, type 2 diabetes, restless leg, hypertension, hyperlipidemia, atrial fibrillation on Eliquis, who presents to the ED due to ground-level falls.  History obtained from patient's daughters at bedside due to patient's altered mental status.  They note that patient's mental status has significantly declined over the last 2 months and seems to be waxing waning.  There are days that she is oriented and days when she is not.  She is also very irritable and angry, frequently kicking her family out.  They note that she frequently lies, and they are uncertain if it is due to behavioral issue or due to her not knowing, for example she will state that she has home health help at home, but she does not.  They have noticed that occasionally she will pick up a phone and try to use it as a remote and vice versa.  Since she was discharged from the hospital on the sixth, she has had numerous falls.  Each time the daughters have a checked on her, she has been on the ground facedown.  They have had to call EMT 3 times.  They are aware that she is unsafe to live independently, and she is actually being evicted from her ALF due to the condition of her home.  ED course: On arrival to the ED, patient was hypertensive at 146/74 with heart rate 66.  She was saturating at 99% on room air.  She was afebrile at 97.5. Initial workup notable for normal CBC, sodium of 133, and glucose of 165.  CT head, C-spine, lumbar x-ray, pelvic x-ray, rib x-ray and thoracic x-ray all negative.  TRH contacted for admission.  Review of Systems: As mentioned in the history of present illness. All other systems reviewed and  are negative.  Past Medical History:  Diagnosis Date   Atrial fibrillation (HCC)    Bronchitis    Cancer (HCC)    Left leg growth, kidneys, lungs and breasts   Carcinoma of unknown primary (HCC)    COPD (chronic obstructive pulmonary disease) (HCC)    Diabetes mellitus without complication (HCC)    Hyperlipidemia    Hypertension    Pancreatitis    Pneumonia    Stroke (HCC)    TIA's   Vitamin B12 deficiency    Past Surgical History:  Procedure Laterality Date   ABDOMINAL HYSTERECTOMY  1975   BLADDER SUSPENSION  1989   CAROTID ANGIOGRAPHY Right 11/23/2019   Procedure: CAROTID ANGIOGRAPHY;  Surgeon: Renford Dills, MD;  Location: [facility] INVASIVE CV LAB;  Service: Cardiovascular;  Laterality: Right;   COLONOSCOPY     COLONOSCOPY WITH PROPOFOL N/A 01/25/2018   Procedure: COLONOSCOPY WITH PROPOFOL;  Surgeon: Midge Minium, MD;  Location: [facility] ENDOSCOPY;  Service: Endoscopy;  Laterality: N/A;   ESOPHAGOGASTRODUODENOSCOPY (EGD) WITH PROPOFOL N/A 01/24/2018   Procedure: ESOPHAGOGASTRODUODENOSCOPY (EGD) WITH PROPOFOL;  Surgeon: Midge Minium, MD;  Location: [facility] ENDOSCOPY;  Service: Endoscopy;  Laterality: N/A;   HIP ARTHROPLASTY Left 06/03/2019   Procedure: ARTHROPLASTY BIPOLAR HIP (HEMIARTHROPLASTY);  Surgeon: Donato Heinz, MD;  Location: [facility] ORS;  Service: Orthopedics;  Laterality: Left;   HIP ARTHROPLASTY Right 04/21/2020   Procedure: ARTHROPLASTY BIPOLAR HIP (HEMIARTHROPLASTY);  Surgeon:  Poggi, Excell Seltzer, MD;  Location: [facility] ORS;  Service: Orthopedics;  Laterality: Right;   Social History:  reports that she quit smoking about 34 years ago. Her smoking use included cigarettes. She started smoking about 49 years ago. She has a 7.5 pack-year smoking history. She has never used smokeless tobacco. She reports that she does not drink alcohol and does not use drugs.  Allergies  Allergen Reactions   Sulfa Antibiotics Swelling   Celecoxib Nausea And Vomiting   Acetaminophen Itching   Codeine  Rash   Lyrica [Pregabalin] Rash   Penicillin G Rash    Has patient had a PCN reaction causing immediate rash, facial/tongue/throat swelling, SOB or lightheadedness with hypotension: Yes Has patient had a PCN reaction causing severe rash involving mucus membranes or skin necrosis: No Has patient had a PCN reaction that required hospitalization: No Has patient had a PCN reaction occurring within the last 10 years: Unknown If all of the above answers are "NO", then may proceed with Cephalosporin use.   Petrolatum-Zinc Oxide Rash    Family History  Problem Relation Age of Onset   Cancer Father    Diabetes Brother     Prior to Admission medications   Medication Sig Start Date End Date Taking? Authorizing Provider  albuterol (VENTOLIN HFA) 108 (90 Base) MCG/ACT inhaler INHALE TWO PUFFS INTO THE LUNGS EVERY 4 HOURS AS NEEDED FOR WHEEZING OR SHORTNESS OF BREATH 03/22/23   Sallyanne Kuster, NP  antiseptic oral rinse (BIOTENE) LIQD 15 mLs by Mouth Rinse route as needed for dry mouth. 08/22/23   Pennie Banter, DO  apixaban (ELIQUIS) 5 MG TABS tablet Take 2 tablets (10 mg total) by mouth 2 (two) times daily for 5 days, THEN 1 tablet (5 mg total) 2 (two) times daily for 25 days. 10/21/23 11/20/23  Alford Highland, MD  benazepril (LOTENSIN) 20 MG tablet Take 1 tablet (20 mg total) by mouth daily. 10/22/23   Alford Highland, MD  haloperidol (HALDOL) 1 MG tablet Take 1 tablet (1 mg total) by mouth every 8 (eight) hours as needed for agitation. 08/22/23 08/21/24  Esaw Grandchild A, DO  LORazepam (ATIVAN) 0.5 MG tablet Take 1 tablet (0.5 mg total) by mouth every 8 (eight) hours as needed for anxiety. 08/22/23 08/21/24  Pennie Banter, DO  lubiprostone (AMITIZA) 8 MCG capsule Take 1 capsule (8 mcg total) by mouth 2 (two) times daily with a meal. 05/23/23   Sallyanne Kuster, NP  metoprolol succinate (TOPROL-XL) 25 MG 24 hr tablet Take 25 mg by mouth daily.    [provider]  morphine 20 MG/5ML  solution Take 1.3-2.5 mLs (5.2-10 mg total) by mouth every 2 (two) hours as needed (pain or air hunger). 08/22/23   Esaw Grandchild A, DO  NYSTATIN powder APPLY 1 APPLICATION TOPICALLY 3 TIMES DAILY TO GROIN AREA UNTIL RASH RESOLVES 06/28/23   Sallyanne Kuster, NP  ondansetron (ZOFRAN-ODT) 4 MG disintegrating tablet Take 1 tablet (4 mg total) by mouth every 6 (six) hours as needed for nausea. 08/22/23   Pennie Banter, DO  polyvinyl alcohol (LIQUIFILM TEARS) 1.4 % ophthalmic solution Place 1 drop into both eyes 4 (four) times daily as needed for dry eyes. 08/22/23   Pennie Banter, DO  famotidine (PEPCID) 20 MG tablet Take 1 tablet (20 mg total) by mouth 2 (two) times daily. 08/11/16 11/28/19  Katha Hamming, MD  pravastatin (PRAVACHOL) 40 MG tablet Take 1 tablet (40 mg total) by mouth daily. 08/15/19 11/28/19  Vincent Gros  E, NP    Physical Exam: Vitals:   10/23/23 0934 10/23/23 0936  BP:  (!) 146/74  Pulse:  66  Resp:  15  Temp:  (!) 97.5 F (36.4 C)  TempSrc:  Oral  SpO2:  99%  Weight: 56.7 kg    Physical Exam Vitals and nursing note reviewed.  Constitutional:      Appearance: She is ill-appearing (chronically).  HENT:     Head: Normocephalic.     Comments: Left raccoon eye    Mouth/Throat:     Mouth: Mucous membranes are moist.     Pharynx: Oropharynx is clear.  Eyes:     Conjunctiva/sclera: Conjunctivae normal.     Pupils: Pupils are equal, round, and reactive to light.  Cardiovascular:     Rate and Rhythm: Normal rate. Rhythm irregular.     Heart sounds: No murmur heard. Pulmonary:     Effort: Pulmonary effort is normal. No respiratory distress.     Breath sounds: Normal breath sounds.  Abdominal:     Palpations: Abdomen is soft.     Comments: Patient angry and slapped hand off during abdominal exam.  Unable to assess for tenderness  Skin:    General: Skin is warm and dry.     Comments: Multiple bruises in various stages of healing  Neurological:     Mental  Status: She is alert.     Comments:  Patient is alert and oriented to self, year only, and somewhat situation.  She is aware that she is here due to a fall. In terms of location, she stated [place].  During orientation questions, patient stated "this is why I'm losing my mind you stupid bitch" Moving all extremities against gravity No facial asymmetry or dysarthria   Psychiatric:        Mood and Affect: Affect is angry and inappropriate.        Speech: Speech normal.        Behavior: Behavior is aggressive.        Thought Content: Thought content is paranoid.        Cognition and Memory: Cognition is impaired. She exhibits impaired recent memory.     Comments: Only somewhat cooperative    Data Reviewed: CBC with WBC of 8.5, hemoglobin of 12.3, platelets of 223 BMP with sodium of 133, potassium 4.1, bicarb 23, glucose 165, BUN 20, creatinine 0.72 with GFR above 60  DG Thoracic Spine 2 View  Result Date: 10/23/2023 CLINICAL DATA:  Larey Seat EXAM: THORACIC SPINE 2 VIEWS COMPARISON:  None Available. FINDINGS: Frontal and lateral views of the thoracic spine are obtained. Anterior wedge compression deformity is seen at the L1 vertebral body. No evidence of acute thoracic spine fractures. Mild diffuse thoracic spondylosis. Paraspinal soft tissues are unremarkable. Lungs are clear. IMPRESSION: 1. No acute thoracic spine fracture. 2. Anterior wedge compression deformity of the L1 vertebral body. Please see separately reported lumbar spine exam. Electronically Signed   By: Sharlet Salina M.D.   On: 10/23/2023 11:29   DG Lumbar Spine 2-3 Views  Result Date: 10/23/2023 CLINICAL DATA:  Larey Seat EXAM: LUMBAR SPINE - 2-3 VIEW COMPARISON:  05/31/2019 FINDINGS: Frontal and lateral views of the lumbar spine are obtained. There are 6 non-rib-bearing lumbar type vertebral bodies. Interval anterior wedge compression deformity of the L1 vertebral body, with possible fracture line through the anterior superior  endplate compatible with an acute to subacute compression fracture. No evidence of retropulsion. No other acute bony abnormalities. Stable multilevel spondylosis and facet  hypertrophy greatest in the lower lumbar spine. Stable grade 1 anterolisthesis of L5 on L6. IMPRESSION: 1. Interval anterior wedge compression deformity of the L1 vertebral body, with less than 25% loss of height, likely acute. No retropulsion. 2. Stable multilevel lumbar degenerative changes. Electronically Signed   By: Sharlet Salina M.D.   On: 10/23/2023 11:29   DG Pelvis Portable  Result Date: 10/23/2023 CLINICAL DATA:  Larey Seat EXAM: PORTABLE PELVIS 1-2 VIEWS COMPARISON:  10/22/2023 FINDINGS: Single frontal view the pelvis was obtained. Stable bilateral hip arthroplasties, with distal margins of the femoral component of the prosthesis excluded by collimation. No acute displaced fracture. Sacroiliac joints are unremarkable. IMPRESSION: 1. Stable pelvis, no acute process. Electronically Signed   By: Sharlet Salina M.D.   On: 10/23/2023 11:25   DG Ribs Unilateral W/Chest Right  Result Date: 10/23/2023 CLINICAL DATA:  Un witnessed fall EXAM: RIGHT RIBS AND CHEST - 3+ VIEW COMPARISON:  08/18/2023 FINDINGS: Frontal view of the chest as well as frontal and oblique views of the right thoracic cage are obtained. Cardiac silhouette is unremarkable. No acute airspace disease, effusion, or pneumothorax. Prior healed right lateral sixth and seventh rib fractures. No acute bony abnormalities. IMPRESSION: 1. No acute displaced rib fracture. 2. No acute intrathoracic process. Electronically Signed   By: Sharlet Salina M.D.   On: 10/23/2023 11:24   CT Cervical Spine Wo Contrast  Result Date: 10/23/2023 CLINICAL DATA:  Neck trauma (Age >= 65y); Head trauma, minor (Age >= 65y) EXAM: CT HEAD WITHOUT CONTRAST CT CERVICAL SPINE WITHOUT CONTRAST TECHNIQUE: Multidetector CT imaging of the head and cervical spine was performed following the standard protocol  without intravenous contrast. Multiplanar CT image reconstructions of the cervical spine were also generated. RADIATION DOSE REDUCTION: This exam was performed according to the departmental dose-optimization program which includes automated exposure control, adjustment of the mA and/or kV according to patient size and/or use of iterative reconstruction technique. COMPARISON:  CT head and C-spine 09/06/2023 FINDINGS: CT HEAD FINDINGS Brain: Cerebral ventricle sizes are concordant with the degree of cerebral volume loss. Patchy and confluent areas of decreased attenuation are noted throughout the deep and periventricular white matter of the cerebral hemispheres bilaterally, compatible with chronic microvascular ischemic disease. no acute intracranial abnormality. No evidence of large-territorial acute infarction. No parenchymal hemorrhage. No mass lesion. No extra-axial collection. No mass effect or midline shift. No hydrocephalus. Basilar cisterns are patent. Vascular: No hyperdense vessel. Atherosclerotic calcifications are present within the cavernous internal carotid arteries. Skull: No acute fracture or focal lesion. Sinuses/Orbits: Paranasal sinuses and mastoid air cells are clear. Bilateral lens replacement. Otherwise the orbits are unremarkable. Other: None. CT CERVICAL SPINE FINDINGS Alignment: Normal. Skull base and vertebrae: Multilevel moderate severe degenerative changes spine with posterior disc osteophyte complex formation. Severe osseous neural foraminal stenosis at the left C2-C3 and C3-C4 levels. At least moderate central canal stenosis at the C6-C7 level. No acute fracture. No aggressive appearing focal osseous lesion or focal pathologic process. Soft tissues and spinal canal: No prevertebral fluid or swelling. No visible canal hematoma. Upper chest: Unremarkable. Other: Atherosclerotic plaque. IMPRESSION: 1.  No acute intracranial abnormality. 2. No acute displaced fracture or traumatic listhesis  of the cervical spine. Electronically Signed   By: Tish Frederickson M.D.   On: 10/23/2023 09:56   CT Head Wo Contrast  Result Date: 10/23/2023 CLINICAL DATA:  Neck trauma (Age >= 65y); Head trauma, minor (Age >= 65y) EXAM: CT HEAD WITHOUT CONTRAST CT CERVICAL SPINE WITHOUT CONTRAST TECHNIQUE: Multidetector  CT imaging of the head and cervical spine was performed following the standard protocol without intravenous contrast. Multiplanar CT image reconstructions of the cervical spine were also generated. RADIATION DOSE REDUCTION: This exam was performed according to the departmental dose-optimization program which includes automated exposure control, adjustment of the mA and/or kV according to patient size and/or use of iterative reconstruction technique. COMPARISON:  CT head and C-spine 09/06/2023 FINDINGS: CT HEAD FINDINGS Brain: Cerebral ventricle sizes are concordant with the degree of cerebral volume loss. Patchy and confluent areas of decreased attenuation are noted throughout the deep and periventricular white matter of the cerebral hemispheres bilaterally, compatible with chronic microvascular ischemic disease. no acute intracranial abnormality. No evidence of large-territorial acute infarction. No parenchymal hemorrhage. No mass lesion. No extra-axial collection. No mass effect or midline shift. No hydrocephalus. Basilar cisterns are patent. Vascular: No hyperdense vessel. Atherosclerotic calcifications are present within the cavernous internal carotid arteries. Skull: No acute fracture or focal lesion. Sinuses/Orbits: Paranasal sinuses and mastoid air cells are clear. Bilateral lens replacement. Otherwise the orbits are unremarkable. Other: None. CT CERVICAL SPINE FINDINGS Alignment: Normal. Skull base and vertebrae: Multilevel moderate severe degenerative changes spine with posterior disc osteophyte complex formation. Severe osseous neural foraminal stenosis at the left C2-C3 and C3-C4 levels. At least  moderate central canal stenosis at the C6-C7 level. No acute fracture. No aggressive appearing focal osseous lesion or focal pathologic process. Soft tissues and spinal canal: No prevertebral fluid or swelling. No visible canal hematoma. Upper chest: Unremarkable. Other: Atherosclerotic plaque. IMPRESSION: 1.  No acute intracranial abnormality. 2. No acute displaced fracture or traumatic listhesis of the cervical spine. Electronically Signed   By: Tish Frederickson M.D.   On: 10/23/2023 09:56    There are no new results to review at this time.  Assessment and Plan:  * Frequent falls Patient has a history of frequent falls dating the last few months, with numerous falls in the last 2 days complicated by an L1 compression fracture.  Patient denies any pain at this time.  - PT/OT - Pain control with Tylenol  Altered mental status Per chart review, patient has been altered on every hospital/ER visit since the beginning of October.  During this time, her family states that she has had a rapid decline in mentation.  She is only intermittently oriented, frequently lies, and becomes easily confused.  Apparently her home is in disarray and she is actually being evicted from her ALF.  Her behavior has also become more aggressive and angry, although her family notes she has a pre-existing personality disorder.  Initial concern for UTI contributing, however she received complete treatment prior to discharge on the 12/6.  I have a strong position that she has underlying dementia with the rapid progression in the setting of multiple hospitalization and falls, with worsening behavioral disturbances.  - Patient does not have medical capacity.  Although she may be partly oriented, she does not understand the gravity of her medical decisions - Psychiatry consultation for assistance with behavioral disturbances; appreciate their recommendations - Continue Ativan and Amitizia  Goals of care,  counseling/discussion Per discussion with family, given advanced age and suspected underlying dementia, they would want no invasive measures to be taken in the setting of cardiac or pulmonary arrest.  They would like to focus on her comfort.  - DNR/DNI  Type 2 diabetes mellitus with chronic kidney disease, without long-term current use of insulin (HCC) Diet controlled at this time with last A1c of 7.5%.  No indication for SSI  Paroxysmal atrial fibrillation (HCC) - Continue home metoprolol to avoid RVR - Continue home Eliquis  DVT (deep venous thrombosis) (HCC) - Given recent DVT diagnosis, continue home Eliquis  Chronic obstructive pulmonary disease (HCC) - DuoNebs as needed  Essential hypertension - Given advanced age, discontinue home Benzapril.  Given suspected poor p.o. intake, high risk for AKI and no benefit of ACE-i at this point.  Advance Care Planning:   Code Status: Do not attempt resuscitation (DNR) - Comfort care please see goals of care discussion as noted above  Consults: Psychiatry  Family Communication: Patient's daughters updated at bedside  Severity of Illness: The appropriate patient status for this patient is OBSERVATION. Observation status is judged to be reasonable and necessary in order to provide the required intensity of service to ensure the patient's safety. The patient's presenting symptoms, physical exam findings, and initial radiographic and laboratory data in the context of their medical condition is felt to place them at decreased risk for further clinical deterioration. Furthermore, it is anticipated that the patient will be medically stable for discharge from the hospital within 2 midnights of admission.   Author: Verdene Lennert, MD 10/23/2023 2:06 PM  For on call review [email or web address].

## 2023-10-23 NOTE — Assessment & Plan Note (Signed)
Diet controlled at this time with last A1c of 7.5%.  No indication for SSI

## 2023-10-23 NOTE — Assessment & Plan Note (Signed)
Patient has a history of frequent falls dating the last few months, with numerous falls in the last 2 days complicated by an L1 compression fracture.  Patient denies any pain at this time.  - PT/OT - Pain control with Tylenol

## 2023-10-23 NOTE — ED Provider Notes (Addendum)
[facility] Provider Note    Event Date/Time   First MD Initiated Contact with Patient 10/23/23 1001     (approximate)   History   No chief complaint on file.   HPI  Monica [name] is a 89 y.o. female   Past medical history of recent hospitalization for DVT, urinary tract infection, discharged on 10/21/2023 back to independent living presents with frequent falls.  Larey Seat again yesterday after trying to get changed.  Struck her head.  She takes Eliquis.  She was seen in the emerged apartment yesterday for 2 additional falls where she slid from her recliner with negative workup and per patient's wishes was discharged back to her independent living facility.  She is here with her daughters at this time, they checked on her mother and her home was in disarray.  Independent Historian contributed to assessment above: Daughter is at bedside to corroborate information past medical history as above  External Medical Documents Reviewed: Discharge summary from earlier this week with DVT and UTI discharge.      Physical Exam   Triage Vital Signs: ED Triage Vitals  Encounter Vitals Group     BP 10/23/23 0936 (!) 146/74     Systolic BP Percentile --      Diastolic BP Percentile --      Pulse Rate 10/23/23 0936 66     Resp 10/23/23 0936 15     Temp 10/23/23 0936 (!) 97.5 F (36.4 C)     Temp Source 10/23/23 0936 Oral     SpO2 10/23/23 0936 99 %     Weight 10/23/23 0934 125 lb (56.7 kg)     Height --      Head Circumference --      Peak Flow --      Pain Score 10/23/23 0934 0     Pain Loc --      Pain Education --      Exclude from Growth Chart --     Most recent vital signs: Vitals:   10/23/23 0936  BP: (!) 146/74  Pulse: 66  Resp: 15  Temp: (!) 97.5 F (36.4 C)  SpO2: 99%    General: Awake, no distress.  CV:  Good peripheral perfusion.  Resp:  Normal effort.  Abd:  No distention.  Other:  Bruising around the left eye.  Neck supple full  admission.  Right-sided rib tenderness to palpation.  Soft nontender abdomen.  T and L-spine tenderness with no obvious step-off or deformity.  Moving all extremities.   ED Results / Procedures / Treatments   Labs (all labs ordered are listed, but only abnormal results are displayed) Labs Reviewed  BASIC METABOLIC PANEL - Abnormal; Notable for the following components:      Result Value   Sodium 133 (*)    Chloride 97 (*)    Glucose, Bld 165 (*)    All other components within normal limits  CBC WITH DIFFERENTIAL/PLATELET - Abnormal; Notable for the following components:   MCH 25.8 (*)    All other components within normal limits  URINALYSIS, W/ REFLEX TO CULTURE (INFECTION SUSPECTED)    RADIOLOGY I independently reviewed and interpreted CT scan of the head see no obvious bleeding or midline shift I also reviewed radiologist's formal read.   PROCEDURES:  Critical Care performed: No  Procedures   MEDICATIONS ORDERED IN ED: Medications - No data to display  External physician / consultants:  I spoke with hospitalist for admission and  regarding care plan for this patient.   IMPRESSION / MDM / ASSESSMENT AND PLAN / ED COURSE  I reviewed the triage vital signs and the nursing notes.                                Patient's presentation is most consistent with acute presentation with potential threat to life or bodily function.  Differential diagnosis includes, but is not limited to, urinary tract infection, DVT, blunt traumatic injury including fractures or dislocations of the ribs, spine, hips, ICH or skull fracture, failure to thrive   The patient is on the cardiac monitor to evaluate for evidence of arrhythmia and/or significant heart rate changes.  MDM:    Is a patient who was recently discharged for UTI and DVT who has had failure to thrive at home house is in disarray and has had multiple falls leading to repeat emergency department visits in the last 48 hours.   Per daughters, she is living alone and has shown an acute worsening of her cognition.  When I interviewed her today she appears to be more confused than even my evaluation yesterday, is now unclear of her diagnosis of her recent hospitalization, does not recognize one of her daughters at bedside.  Fortunately traumatic injuries negative for life-threatening injuries and that there is no head bleed C-spine fracture and she does have a mild compression fraction of L1.  Will get repeat labs, urinalysis and admit.  L1 compression fracture <25% height loss otherwise no other significant injuries noted on traumatic survey or imaging.  I attempted to reach out to her hospice care [facility] both during yesterday's visit and again today but have not received a response, left voicemail.  I think that her increased confusion and frequency of falls may be due to her recently diagnosed urinary tract infection, unclear if she has been taking her outpatient antibiotics as prescribed when she was discharged 2 days ago.  Given her altered mental status, I do not think she has the capacity make her medical decisions at this point.  She is however amenable for admission and care plan as discussed above.       FINAL CLINICAL IMPRESSION(S) / ED DIAGNOSES   Final diagnoses:  Urinary tract infection without hematuria, site unspecified  Deep vein thrombosis (DVT) of proximal lower extremity, unspecified chronicity, unspecified laterality (HCC)  Frequent falls  Closed compression fracture of body of L1 vertebra (HCC)     Rx / DC Orders   ED Discharge Orders     None        Note:  This document was prepared using Dragon voice recognition software and may include unintentional dictation errors.    Pilar Jarvis, MD 10/23/23 1136    Pilar Jarvis, MD 10/23/23 1155    Pilar Jarvis, MD 10/23/23 1229

## 2023-10-23 NOTE — Progress Notes (Signed)
Patient arrived on unit at 93.  Patient oriented to to room and unit with understanding unknown.  Patient oriented to name only.  Education provided for call light use.  Visible left orbital erythema with denial of discomfort.

## 2023-10-23 NOTE — Assessment & Plan Note (Signed)
Per discussion with family, given advanced age and suspected underlying dementia, they would want no invasive measures to be taken in the setting of cardiac or pulmonary arrest.  They would like to focus on her comfort.  - DNR/DNI

## 2023-10-23 NOTE — Assessment & Plan Note (Addendum)
Per chart review, patient has been altered on every hospital/ER visit since the beginning of October.  During this time, her family states that she has had a rapid decline in mentation.  She is only intermittently oriented, frequently lies, and becomes easily confused.  Apparently her home is in disarray and she is actually being evicted from her ALF.  Her behavior has also become more aggressive and angry, although her family notes she has a pre-existing personality disorder.  Initial concern for UTI contributing, however she received complete treatment prior to discharge on the 12/6.  I have a strong position that she has underlying dementia with the rapid progression in the setting of multiple hospitalization and falls, with worsening behavioral disturbances.  - Patient does not have medical capacity.  Although she may be partly oriented, she does not understand the gravity of her medical decisions - Psychiatry consultation for assistance with behavioral disturbances; appreciate their recommendations - Continue Ativan and Amitizia

## 2023-10-23 NOTE — ED Notes (Signed)
Pt was soiled with urine. The pt's brief was replaced and purwick put back in place.

## 2023-10-23 NOTE — Consult Note (Cosign Needed Addendum)
[facility] Face-to-Face Psychiatry Consult   Reason for Consult:  agitation towards family Referring Physician:  EDP Patient Identification: Monica [name] MRN:  [id number] Principal Diagnosis: Agitation Diagnosis:  Principal Problem:   Agitation Active Problems:   Essential hypertension   Chronic obstructive pulmonary disease (HCC)   Type 2 diabetes mellitus with chronic kidney disease, without long-term current use of insulin (HCC)   Goals of care, counseling/discussion   DVT (deep venous thrombosis) (HCC)   Frequent falls   Altered mental status   Paroxysmal atrial fibrillation (HCC)   Total Time spent with patient: 45 minutes  Subjective:   Monica [name] is a 89 y.o. female patient admitted with falls and agitation.  HPI:  89 yo female presented to the hospital for a fall and agitation.  On assessment, she is sitting up on her bed eating a sandwich and chips.  She stated she did fall but can care for herself.  She currently lives in an independent living facility and has a lady that helps her.  Regardless, this assistant only does her laundry and may buy some groceries for her.  Ms. Barsch denies depression, "I'm happy as a lark".  No anxiety, "I worry about other people but not myself."  When inquired about sleep, she stated she sleeps whenever she wants.  Throughout the assessment, she stated she can take care of herself.  She does admit to kicking her family out at times as they want her to move to an assisted living and she does not want to.  Denies hallucinations and substance abuse. At the end of the assessment, she stated, "I can adjust to any circumstance in life."  Unbeknownst to her, she is not able to return to independent living as the facility is evicting her.  TOC consult placed by the attending to coordinate care for her next step.  Collateral information from her daughter, [name], who states she has been mean her whole life, she has a "horrible, horrible personality.  She has  a mean streak, like her father.  This is nothing new". Her family is trying to help her as she is being evicted from independent living which she is not aware of but she will not left them help her.  She is adamant that she can care for herself.  She pays a lady to do her laundry even though she cannot afford it.  Her family was doing this for her prior to her hiring this lady.    Past Psychiatric History: personality issues  Risk to Self:  none Risk to Others:  none Prior Inpatient Therapy:  not Prior Outpatient Therapy:  none  Past Medical History:  Past Medical History:  Diagnosis Date   Atrial fibrillation (HCC)    Bronchitis    Cancer (HCC)    Left leg growth, kidneys, lungs and breasts   Carcinoma of unknown primary (HCC)    COPD (chronic obstructive pulmonary disease) (HCC)    Diabetes mellitus without complication (HCC)    Hyperlipidemia    Hypertension    Pancreatitis    Pneumonia    Stroke (HCC)    TIA's   Vitamin B12 deficiency     Past Surgical History:  Procedure Laterality Date   ABDOMINAL HYSTERECTOMY  1975   BLADDER SUSPENSION  1989   CAROTID ANGIOGRAPHY Right 11/23/2019   Procedure: CAROTID ANGIOGRAPHY;  Surgeon: Renford Dills, MD;  Location: [facility] INVASIVE CV LAB;  Service: Cardiovascular;  Laterality: Right;   COLONOSCOPY  COLONOSCOPY WITH PROPOFOL N/A 01/25/2018   Procedure: COLONOSCOPY WITH PROPOFOL;  Surgeon: Midge Minium, MD;  Location[facility] ENDOSCOPY;  Service: Endoscopy;  Laterality: N/A;   ESOPHAGOGASTRODUODENOSCOPY (EGD) WITH PROPOFOL N/A 01/24/2018   Procedure: ESOPHAGOGASTRODUODENOSCOPY (EGD) WITH PROPOFOL;  Surgeon: Midge Minium, MD;  Location: [facility] ENDOSCOPY;  Service: Endoscopy;  Laterality: N/A;   HIP ARTHROPLASTY Left 06/03/2019   Procedure: ARTHROPLASTY BIPOLAR HIP (HEMIARTHROPLASTY);  Surgeon: Donato Heinz, MD;  Location: [facility] ORS;  Service: Orthopedics;  Laterality: Left;   HIP ARTHROPLASTY Right 04/21/2020   Procedure: ARTHROPLASTY  BIPOLAR HIP (HEMIARTHROPLASTY);  Surgeon: Christena Flake, MD;  Location: [facility] ORS;  Service: Orthopedics;  Laterality: Right;   Family History:  Family History  Problem Relation Age of Onset   Cancer Father    Diabetes Brother    Family Psychiatric  History: none Social History:  Social History   Substance and Sexual Activity  Alcohol Use No   Alcohol/week: 0.0 standard drinks of alcohol     Social History   Substance and Sexual Activity  Drug Use No    Social History   Socioeconomic History   Marital status: Divorced    Spouse name: Not on file   Number of children: Not on file   Years of education: Not on file   Highest education level: Not on file  Occupational History   Not on file  Tobacco Use   Smoking status: Former    Current packs/day: 0.00    Average packs/day: 0.5 packs/day for 15.0 years (7.5 ttl pk-yrs)    Types: Cigarettes    Start date: 11/15/1973    Quit date: 11/15/1988    Years since quitting: 34.9   Smokeless tobacco: Never  Vaping Use   Vaping status: Never Used  Substance and Sexual Activity   Alcohol use: No    Alcohol/week: 0.0 standard drinks of alcohol   Drug use: No   Sexual activity: Not Currently    Birth control/protection: Other-see comments    Comment: hysterectomy   Other Topics Concern   Not on file  Social History Narrative   Not on file   Social Determinants of Health   Financial Resource Strain: Not on file  Food Insecurity: No Food Insecurity (10/18/2023)   Hunger Vital Sign    Worried About Running Out of Food in the Last Year: Never true    Ran Out of Food in the Last Year: Never true  Transportation Needs: No Transportation Needs (10/18/2023)   PRAPARE - Administrator, Civil Service (Medical): No    Lack of Transportation (Non-Medical): No  Physical Activity: Not on file  Stress: Not on file  Social Connections: Not on file   Additional Social History:  lives in independent living and will be evicted     Allergies:   Allergies  Allergen Reactions   Sulfa Antibiotics Swelling   Celecoxib Nausea And Vomiting   Acetaminophen Itching   Codeine Rash   Lyrica [Pregabalin] Rash   Penicillin G Rash    Has patient had a PCN reaction causing immediate rash, facial/tongue/throat swelling, SOB or lightheadedness with hypotension: Yes Has patient had a PCN reaction causing severe rash involving mucus membranes or skin necrosis: No Has patient had a PCN reaction that required hospitalization: No Has patient had a PCN reaction occurring within the last 10 years: Unknown If all of the above answers are "NO", then may proceed with Cephalosporin use.   Petrolatum-Zinc Oxide Rash    Labs:  Results for orders placed or performed during the hospital encounter of 10/23/23 (from the past 48 hour(s))  Basic metabolic panel     Status: Abnormal   Collection Time: 10/23/23 11:37 AM  Result Value Ref Range   Sodium 133 (L) 135 - 145 mmol/L   Potassium 4.1 3.5 - 5.1 mmol/L   Chloride 97 (L) 98 - 111 mmol/L   CO2 23 22 - 32 mmol/L   Glucose, Bld 165 (H) 70 - 99 mg/dL    Comment: Glucose reference range applies only to samples taken after fasting for at least 8 hours.   BUN 20 8 - 23 mg/dL   Creatinine, Ser 0.10 0.44 - 1.00 mg/dL   Calcium 8.9 8.9 - 27.2 mg/dL   GFR, Estimated >53 >66 mL/min    Comment: (NOTE) Calculated using the CKD-EPI Creatinine Equation (2021)    Anion gap 13 5 - 15    Comment: Performed at [facility], [address] [place], [place] [address]  CBC with Differential     Status: Abnormal   Collection Time: 10/23/23 11:37 AM  Result Value Ref Range   WBC 8.5 4.0 - 10.5 K/uL   RBC 4.77 3.87 - 5.11 MIL/uL   Hemoglobin 12.3 12.0 - 15.0 g/dL   HCT 74.2 59.5 - 63.8 %   MCV 80.1 80.0 - 100.0 fL   MCH 25.8 (L) 26.0 - 34.0 pg   MCHC 32.2 30.0 - 36.0 g/dL   RDW 75.6 43.3 - 29.5 %   Platelets 223 150 - 400 K/uL   nRBC 0.0 0.0 - 0.2 %   Neutrophils Relative % 84 %    Neutro Abs 7.2 1.7 - 7.7 K/uL   Lymphocytes Relative 10 %   Lymphs Abs 0.8 0.7 - 4.0 K/uL   Monocytes Relative 5 %   Monocytes Absolute 0.4 0.1 - 1.0 K/uL   Eosinophils Relative 0 %   Eosinophils Absolute 0.0 0.0 - 0.5 K/uL   Basophils Relative 1 %   Basophils Absolute 0.1 0.0 - 0.1 K/uL   Immature Granulocytes 0 %   Abs Immature Granulocytes 0.03 0.00 - 0.07 K/uL    Comment: Performed at [facility], [address] [place], [place] [address]    Current Facility-Administered Medications  Medication Dose Route Frequency Provider Last Rate Last Admin   acetaminophen (TYLENOL) tablet 650 mg  650 mg Oral Q6H PRN Verdene Lennert, MD       Or   acetaminophen (TYLENOL) suppository 650 mg  650 mg Rectal Q6H PRN Verdene Lennert, MD       apixaban (ELIQUIS) tablet 10 mg  10 mg Oral BID Verdene Lennert, MD       Followed by   Melene Muller ON 10/26/2023] apixaban (ELIQUIS) tablet 5 mg  5 mg Oral BID Verdene Lennert, MD       LORazepam (ATIVAN) tablet 0.5 mg  0.5 mg Oral Q8H PRN Verdene Lennert, MD       lubiprostone (AMITIZA) capsule 8 mcg  8 mcg Oral BID WC Verdene Lennert, MD       metoprolol succinate (TOPROL-XL) 24 hr tablet 25 mg  25 mg Oral Daily Verdene Lennert, MD       ondansetron (ZOFRAN) tablet 4 mg  4 mg Oral Q6H PRN Verdene Lennert, MD       Or   ondansetron (ZOFRAN) injection 4 mg  4 mg Intravenous Q6H PRN Verdene Lennert, MD       polyethylene glycol (MIRALAX / GLYCOLAX) packet  17 g  17 g Oral Daily PRN Verdene Lennert, MD       Current Outpatient Medications  Medication Sig Dispense Refill   albuterol (VENTOLIN HFA) 108 (90 Base) MCG/ACT inhaler INHALE TWO PUFFS INTO THE LUNGS EVERY 4 HOURS AS NEEDED FOR WHEEZING OR SHORTNESS OF BREATH 8.5 g 3   antiseptic oral rinse (BIOTENE) LIQD 15 mLs by Mouth Rinse route as needed for dry mouth.     apixaban (ELIQUIS) 5 MG TABS tablet Take 2 tablets (10 mg total) by mouth 2 (two) times daily for 5 days, THEN 1 tablet (5 mg total) 2  (two) times daily for 25 days. 70 tablet 0   haloperidol (HALDOL) 1 MG tablet Take 1 tablet (1 mg total) by mouth every 8 (eight) hours as needed for agitation. 90 tablet 2   LORazepam (ATIVAN) 0.5 MG tablet Take 1 tablet (0.5 mg total) by mouth every 8 (eight) hours as needed for anxiety. 30 tablet 0   lubiprostone (AMITIZA) 8 MCG capsule Take 1 capsule (8 mcg total) by mouth 2 (two) times daily with a meal. 60 capsule 3   metoprolol succinate (TOPROL-XL) 25 MG 24 hr tablet Take 25 mg by mouth daily.     morphine 20 MG/5ML solution Take 1.3-2.5 mLs (5.2-10 mg total) by mouth every 2 (two) hours as needed (pain or air hunger). 30 mL 0   NYSTATIN powder APPLY 1 APPLICATION TOPICALLY 3 TIMES DAILY TO GROIN AREA UNTIL RASH RESOLVES 15 g 0   ondansetron (ZOFRAN-ODT) 4 MG disintegrating tablet Take 1 tablet (4 mg total) by mouth every 6 (six) hours as needed for nausea. 20 tablet 0   polyvinyl alcohol (LIQUIFILM TEARS) 1.4 % ophthalmic solution Place 1 drop into both eyes 4 (four) times daily as needed for dry eyes. 15 mL 0   benazepril (LOTENSIN) 20 MG tablet Take 1 tablet (20 mg total) by mouth daily. (Patient not taking: Reported on 10/23/2023) 30 tablet 0    Musculoskeletal: Strength & Muscle Tone: within normal limits Gait & Station: normal Patient leans: N/A  Psychiatric Specialty Exam: Physical Exam Vitals and nursing note reviewed.  Constitutional:      Appearance: Normal appearance.  HENT:     Head: Normocephalic.     Nose: Nose normal.  Pulmonary:     Effort: Pulmonary effort is normal.  Musculoskeletal:        General: Normal range of motion.     Cervical back: Normal range of motion.  Neurological:     General: No focal deficit present.     Mental Status: She is alert and oriented to person, place, and time.     Review of Systems  All other systems reviewed and are negative.   Blood pressure (!) 146/74, pulse 66, temperature (!) 97.5 F (36.4 C), temperature source Oral,  resp. rate 15, weight 56.7 kg, SpO2 99%.Body mass index is 21.46 kg/m.  General Appearance: Casual  Eye Contact:  Good  Speech:  Normal Rate  Volume:  Normal  Mood:  Euthymic  Affect:  Congruent  Thought Process:  Coherent  Orientation:  Full (Time, Place, and Person)  Thought Content:  Logical  Suicidal Thoughts:  No  Homicidal Thoughts:  No  Memory:  Immediate;   Fair Recent;   Fair Remote;   Fair  Judgement:  Fair  Insight:  Fair  Psychomotor Activity:  Normal  Concentration:  Concentration: Fair and Attention Span: Fair  Recall:  [employer] of Knowledge:  Good  Language:  Good  Akathisia:  No  Handed:  Right  AIMS (if indicated):     Assets:  Housing Leisure Time Resilience Social Support  ADL's:  Intact  Cognition:  WNL  Sleep:        Physical Exam: Physical Exam Vitals and nursing note reviewed.  Constitutional:      Appearance: Normal appearance.  HENT:     Head: Normocephalic.     Nose: Nose normal.  Pulmonary:     Effort: Pulmonary effort is normal.  Musculoskeletal:        General: Normal range of motion.     Cervical back: Normal range of motion.  Neurological:     General: No focal deficit present.     Mental Status: She is alert and oriented to person, place, and time.    Review of Systems  All other systems reviewed and are negative.  Blood pressure (!) 146/74, pulse 66, temperature (!) 97.5 F (36.4 C), temperature source Oral, resp. rate 15, weight 56.7 kg, SpO2 99%. Body mass index is 21.46 kg/m.  Treatment Plan Summary: Agitation: Ativan 0.5 mg every 8 hours PRN Haldol 1 mg every 8 hours PRN  Disposition: No evidence of imminent risk to self or others at present.   Patient does not meet criteria for psychiatric inpatient admission. Supportive therapy provided about ongoing stressors.  Nanine Means, NP 10/23/2023 3:11 PM

## 2023-10-23 NOTE — TOC Progression Note (Signed)
Transition of Care [facility]) - Progression Note    Patient Details  Name: FRANKIE LUMBRA MRN: [id number] Date of Birth: May 20, 1926  Transition of Care [facility]) CM/SW Contact  Colette Ribas, [place] Phone Number: 10/23/2023, 4:11 PM  Clinical Narrative:     CSW received a secure chat from MD, who advised patient is disorientated and needs SNF she advised her daughter is Health POA. I advised MD that HPOA is [name] proctor whose contact info is not on file. I spoke with Bonnee Quin [phone]. She advised [name] Valorie Roosevelt is not local and is her sister. She advised [name] would like to give verbal permission for her sister to be HPOA. I advised we may need to reach out to registration to confirm if this can happen. I advised her Monday would be the best time to confirm this. I provided her the ED CSW tel # for the week.        Expected Discharge Plan and Services                                               Social Determinants of Health (SDOH) Interventions SDOH Screenings   Food Insecurity: No Food Insecurity (10/18/2023)  Housing: Low Risk  (10/18/2023)  Transportation Needs: No Transportation Needs (10/18/2023)  Utilities: Not At Risk (10/18/2023)  Alcohol Screen: Low Risk  (04/20/2022)  Depression (PHQ2-9): Low Risk  (12/01/2022)  Tobacco Use: Medium Risk (10/22/2023)    Readmission Risk Interventions    10/19/2023    2:47 PM 10/18/2023   11:18 AM  Readmission Risk Prevention Plan  Transportation Screening Complete Complete  PCP or Specialist Appt within 3-5 Days Complete   HRI or Home Care Consult Complete Complete  Social Work Consult for Recovery Care Planning/Counseling Complete Complete  Palliative Care Screening Complete Complete  Medication Review Oceanographer) Complete

## 2023-10-23 NOTE — Assessment & Plan Note (Signed)
 DuoNebs as needed

## 2023-10-23 NOTE — Assessment & Plan Note (Signed)
-   Given recent DVT diagnosis, continue home Eliquis

## 2023-10-24 ENCOUNTER — Encounter: Payer: Self-pay | Admitting: Internal Medicine

## 2023-10-24 ENCOUNTER — Inpatient Hospital Stay: Payer: Medicare Other | Admitting: Physician Assistant

## 2023-10-24 DIAGNOSIS — R296 Repeated falls: Secondary | ICD-10-CM | POA: Diagnosis not present

## 2023-10-24 LAB — URINALYSIS, W/ REFLEX TO CULTURE (INFECTION SUSPECTED)
Bilirubin Urine: NEGATIVE
Glucose, UA: NEGATIVE mg/dL
Ketones, ur: 40 mg/dL — AB
Leukocytes,Ua: NEGATIVE
Nitrite: NEGATIVE
Protein, ur: >300 mg/dL — AB
Specific Gravity, Urine: >1.03 — ABNORMAL HIGH (ref 1.005–1.030)
pH: 6 (ref 5.0–8.0)

## 2023-10-24 LAB — GLUCOSE, CAPILLARY: Glucose-Capillary: 252 mg/dL — ABNORMAL HIGH (ref 70–99)

## 2023-10-24 NOTE — Evaluation (Signed)
Physical Therapy Evaluation Patient Details Name: Monica [name] MRN: [id number] DOB: May 07, 1926 Today's Date: 10/24/2023  History of Present Illness  Pt is 89 y/o admitted 10/18/23 for DVT and 10/23/23 for frequent falls. S/Sx associated involve weakness and LLE swelling. PmHx includes: COPD, DM, hyperlipidemia, dementia and HTN.   Clinical Impression  Co-treat with OT. Pt received in bed with RN at bedside and agreed to PT session. Pt performed bed mobility CGA, STS without the use of AD 2+HHA CGA as pt declined the use of RW, and amb ~31ft prior to step pivot transfer to end session in recliner 2+HHA CGA. While seated EOB, pt maintained seated balance as she IND cleaned her face with a wash cloth. Pt refused further mobility and expressed continued pain present in BLE with majority of pain noted in her LLE. Pt did not tolerate Tx well and began to demonstrate agitation towards the end of session as they no longer wanted to participate. Pt will continue to benefit from skilled PT sessions to improve strength, functional mobility, and activity tolerance to maximize safety/IND following D/C.      If plan is discharge home, recommend the following: A little help with walking and/or transfers;A lot of help with bathing/dressing/bathroom;Assistance with cooking/housework;Direct supervision/assist for medications management;Assist for transportation;Help with stairs or ramp for entrance;Supervision due to cognitive status   Can travel by private vehicle   No    Equipment Recommendations Other (comment) (TBD)  Recommendations for Other Services       Functional Status Assessment Patient has had a recent decline in their functional status and demonstrates the ability to make significant improvements in function in a reasonable and predictable amount of time.     Precautions / Restrictions Precautions Precautions: Fall Restrictions Weight Bearing Restrictions: No      Mobility  Bed  Mobility Overal bed mobility: Needs Assistance Bed Mobility: Supine to Sit     Supine to sit: Contact guard, HOB elevated, Used rails     General bed mobility comments: increased time + CGA to assist with trunk management.    Transfers Overall transfer level: Needs assistance Equipment used: 2 person hand held assist Transfers: Sit to/from Stand, Bed to chair/wheelchair/BSC Sit to Stand: Contact guard assist   Step pivot transfers: Contact guard assist       General transfer comment: For all transfers, pt refused the use of AD and therefore used 2+HHA.    Ambulation/Gait Ambulation/Gait assistance: Contact guard assist Gait Distance (Feet): 3 Feet Assistive device: 2 person hand held assist Gait Pattern/deviations: Narrow base of support Gait velocity: decreased     General Gait Details: Pt declined further mobility, however agreed to sit in recliner. Pt took ~3steps prior to performing pivot transfer to chair CGA 2+HHA.  Stairs            Wheelchair Mobility     Tilt Bed    Modified Rankin (Stroke Patients Only)       Balance Overall balance assessment: Needs assistance Sitting-balance support: Feet supported Sitting balance-Leahy Scale: Fair     Standing balance support: Bilateral upper extremity supported, During functional activity Standing balance-Leahy Scale: Fair                               Pertinent Vitals/Pain Pain Assessment Pain Assessment: Faces Faces Pain Scale: Hurts little more Pain Location: LLE Pain Descriptors / Indicators: Grimacing Pain Intervention(s): Monitored during session, RN gave pain meds  during session    Home Living Family/patient expects to be discharged to:: Skilled nursing facility   Available Help at Discharge: Other (Comment) (ILF staff) Type of Home: Independent living facility             Additional Comments: Resident of [facility]    Prior Function Prior Level of Function :  Needs assist             Mobility Comments: Pt reports using a WC at baseline. Pt describes that she performs a STS and step pivot transfer to sit into her WC. ADLs Comments: Pt reports having assistance with bathing and dressing. Pt is IND with toileting and feeding.     Extremity/Trunk Assessment   Upper Extremity Assessment Upper Extremity Assessment: Generalized weakness    Lower Extremity Assessment Lower Extremity Assessment: Generalized weakness       Communication   Communication Communication: Hearing impairment Cueing Techniques: Verbal cues  Cognition Arousal: Alert Behavior During Therapy: WFL for tasks assessed/performed, Agitated Overall Cognitive Status: History of cognitive impairments - at baseline                                 General Comments: Pt only oriented to self. As session progressed, pt presented aggitated with questions asked.        General Comments      Exercises     Assessment/Plan    PT Assessment Patient needs continued PT services  PT Problem List Decreased strength;Decreased activity tolerance;Decreased balance;Decreased mobility;Pain;Decreased cognition       PT Treatment Interventions DME instruction;Gait training;Functional mobility training;Therapeutic activities;Therapeutic exercise;Balance training    PT Goals (Current goals can be found in the Care Plan section)  Acute Rehab PT Goals Patient Stated Goal: To go home PT Goal Formulation: With patient Time For Goal Achievement: 11/07/23 Potential to Achieve Goals: Fair    Frequency Min 1X/week     Co-evaluation PT/OT/SLP Co-Evaluation/Treatment: Yes Reason for Co-Treatment: To address functional/ADL transfers PT goals addressed during session: Mobility/safety with mobility OT goals addressed during session: ADL's and self-care       AM-PAC PT "6 Clicks" Mobility  Outcome Measure Help needed turning from your back to your side while in a flat  bed without using bedrails?: A Little Help needed moving from lying on your back to sitting on the side of a flat bed without using bedrails?: A Lot Help needed moving to and from a bed to a chair (including a wheelchair)?: A Little Help needed standing up from a chair using your arms (e.g., wheelchair or bedside chair)?: A Little Help needed to walk in hospital room?: A Little Help needed climbing 3-5 steps with a railing? : A Lot 6 Click Score: 16    End of Session   Activity Tolerance: Patient limited by fatigue Patient left: in chair;with call bell/phone within reach;with chair alarm set Nurse Communication: Mobility status PT Visit Diagnosis: Unsteadiness on feet (R26.81);Other abnormalities of gait and mobility (R26.89);Difficulty in walking, not elsewhere classified (R26.2);Pain    Time: 9629-5284 PT Time Calculation (min) (ACUTE ONLY): 21 min   Charges:   PT Evaluation $PT Eval Low Complexity: 1 Low   PT General Charges $$ ACUTE PT VISIT: 1 Visit         Jadarrius Maselli Sau[place] Howard SPT, LAT, ATC  Jamicheal Heard Sauvignon-Howard 10/24/2023, 2:05 PM

## 2023-10-24 NOTE — Progress Notes (Addendum)
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  She is admitted to Observation status to [facility] on 12.8.24 with diagnosis frequent falls. Per Dr. Patric Dykes with [facility] this is a related hospital admission.   Met at bedside with Ms. Balfour.  She is in good spirits.  Discussed multiple trips to the ED over last 4 days.  Discussed her current living situation at [facility] Living and that she may require a higher level of care.  She informs me that she is going back to her home at [facility] [place].  Patient is having daily falls and IL is looking to offer notice that she needed to find another living situation.  Hospital Transition of Care involved and having family meeting with daughters later on today.  Patient is pending a psych consult.  Patient's hospice RN casemanager has talked with Darrian today to give report on patient and living situation.  Patient does not allow her daughters to be part of her care and promptly tells me she makes all the decisions.  Patient is appropriate for GIP LOC requiring continuous care to monitor patient's safety, behavorial disturbances with ongoing assessment for interventions including PO ativan.  Patient is uncooperative and agitated and refuses meds at this time.  Psych evaluation is pending.  Vital Signs:  T 97.5,  BP 146/74, P 66, R 15,  Oxi 99% on RA Abnormal Labs:  Na 133, Chloride 97, Glucose 165,  MCH 25.8, Urine Ketones 40mg /dl, Urine Protein >161, specific gravity >1.030, Bacteria UA- few  Diagnostics: DG Thoracic Spine 2 View   Result Date: 10/23/2023 CLINICAL DATA:  Larey Seat EXAM: THORACIC SPINE 2 VIEWS COMPARISON:  None Available. FINDINGS: Frontal and lateral views of the thoracic spine are obtained. Anterior wedge compression deformity is seen at the L1 vertebral body. No evidence of acute thoracic spine fractures. Mild  diffuse thoracic spondylosis. Paraspinal soft tissues are unremarkable. Lungs are clear. IMPRESSION: 1. No acute thoracic spine fracture. 2. Anterior wedge compression deformity of the L1 vertebral body. Please see separately reported lumbar spine exam. Electronically Signed   By: Sharlet Salina M.D.   On: 10/23/2023 11:29    DG Lumbar Spine 2-3 Views   Result Date: 10/23/2023 CLINICAL DATA:  Larey Seat EXAM: LUMBAR SPINE - 2-3 VIEW COMPARISON:  05/31/2019 FINDINGS: Frontal and lateral views of the lumbar spine are obtained. There are 6 non-rib-bearing lumbar type vertebral bodies. Interval anterior wedge compression deformity of the L1 vertebral body, with possible fracture line through the anterior superior endplate compatible with an acute to subacute compression fracture. No evidence of retropulsion. No other acute bony abnormalities. Stable multilevel spondylosis and facet hypertrophy greatest in the lower lumbar spine. Stable grade 1 anterolisthesis of L5 on L6. IMPRESSION: 1. Interval anterior wedge compression deformity of the L1 vertebral body, with less than 25% loss of height, likely acute. No retropulsion. 2. Stable multilevel lumbar degenerative changes. Electronically Signed   By: Sharlet Salina M.D.   On: 10/23/2023 11:29    DG Pelvis Portable   Result Date: 10/23/2023 CLINICAL DATA:  Larey Seat EXAM: PORTABLE PELVIS 1-2 VIEWS COMPARISON:  10/22/2023 FINDINGS: Single frontal view the pelvis was obtained. Stable bilateral hip arthroplasties, with distal margins of the femoral component of the prosthesis excluded by collimation. No acute displaced fracture. Sacroiliac joints are unremarkable. IMPRESSION: 1. Stable pelvis, no acute process. Electronically Signed   By: Sharlet Salina  M.D.   On: 10/23/2023 11:25    DG Ribs Unilateral W/Chest Right   Result Date: 10/23/2023 CLINICAL DATA:  Un witnessed fall EXAM: RIGHT RIBS AND CHEST - 3+ VIEW COMPARISON:  08/18/2023 FINDINGS: Frontal view of the chest as  well as frontal and oblique views of the right thoracic cage are obtained. Cardiac silhouette is unremarkable. No acute airspace disease, effusion, or pneumothorax. Prior healed right lateral sixth and seventh rib fractures. No acute bony abnormalities. IMPRESSION: 1. No acute displaced rib fracture. 2. No acute intrathoracic process. Electronically Signed   By: Sharlet Salina M.D.   On: 10/23/2023 11:24    CT Cervical Spine Wo Contrast   Result Date: 10/23/2023 CLINICAL DATA:  Neck trauma (Age >= 65y); Head trauma, minor (Age >= 65y) EXAM: CT HEAD WITHOUT CONTRAST CT CERVICAL SPINE WITHOUT CONTRAST TECHNIQUE: Multidetector CT imaging of the head and cervical spine was performed following the standard protocol without intravenous contrast. Multiplanar CT image reconstructions of the cervical spine were also generated. RADIATION DOSE REDUCTION: This exam was performed according to the departmental dose-optimization program which includes automated exposure control, adjustment of the mA and/or kV according to patient size and/or use of iterative reconstruction technique. COMPARISON:  CT head and C-spine 09/06/2023 FINDINGS: CT HEAD FINDINGS Brain: Cerebral ventricle sizes are concordant with the degree of cerebral volume loss. Patchy and confluent areas of decreased attenuation are noted throughout the deep and periventricular white matter of the cerebral hemispheres bilaterally, compatible with chronic microvascular ischemic disease. no acute intracranial abnormality. No evidence of large-territorial acute infarction. No parenchymal hemorrhage. No mass lesion. No extra-axial collection. No mass effect or midline shift. No hydrocephalus. Basilar cisterns are patent. Vascular: No hyperdense vessel. Atherosclerotic calcifications are present within the cavernous internal carotid arteries. Skull: No acute fracture or focal lesion. Sinuses/Orbits: Paranasal sinuses and mastoid air cells are clear. Bilateral lens  replacement. Otherwise the orbits are unremarkable. Other: None. CT CERVICAL SPINE FINDINGS Alignment: Normal. Skull base and vertebrae: Multilevel moderate severe degenerative changes spine with posterior disc osteophyte complex formation. Severe osseous neural foraminal stenosis at the left C2-C3 and C3-C4 levels. At least moderate central canal stenosis at the C6-C7 level. No acute fracture. No aggressive appearing focal osseous lesion or focal pathologic process. Soft tissues and spinal canal: No prevertebral fluid or swelling. No visible canal hematoma. Upper chest: Unremarkable. Other: Atherosclerotic plaque. IMPRESSION: 1.  No acute intracranial abnormality. 2. No acute displaced fracture or traumatic listhesis of the cervical spine. Electronically Signed   By: Tish Frederickson M.D.   On: 10/23/2023 09:56    CT Head Wo Contrast   Result Date: 10/23/2023 CLINICAL DATA:  Neck trauma (Age >= 65y); Head trauma, minor (Age >= 65y) EXAM: CT HEAD WITHOUT CONTRAST CT CERVICAL SPINE WITHOUT CONTRAST TECHNIQUE: Multidetector CT imaging of the head and cervical spine was performed following the standard protocol without intravenous contrast. Multiplanar CT image reconstructions of the cervical spine were also generated. RADIATION DOSE REDUCTION: This exam was performed according to the departmental dose-optimization program which includes automated exposure control, adjustment of the mA and/or kV according to patient size and/or use of iterative reconstruction technique. COMPARISON:  CT head and C-spine 09/06/2023 FINDINGS: CT HEAD FINDINGS Brain: Cerebral ventricle sizes are concordant with the degree of cerebral volume loss. Patchy and confluent areas of decreased attenuation are noted throughout the deep and periventricular white matter of the cerebral hemispheres bilaterally, compatible with chronic microvascular ischemic disease. no acute intracranial abnormality. No evidence of large-territorial  acute  infarction. No parenchymal hemorrhage. No mass lesion. No extra-axial collection. No mass effect or midline shift. No hydrocephalus. Basilar cisterns are patent. Vascular: No hyperdense vessel. Atherosclerotic calcifications are present within the cavernous internal carotid arteries. Skull: No acute fracture or focal lesion. Sinuses/Orbits: Paranasal sinuses and mastoid air cells are clear. Bilateral lens replacement. Otherwise the orbits are unremarkable. Other: None. CT CERVICAL SPINE FINDINGS Alignment: Normal. Skull base and vertebrae: Multilevel moderate severe degenerative changes spine with posterior disc osteophyte complex formation. Severe osseous neural foraminal stenosis at the left C2-C3 and C3-C4 levels. At least moderate central canal stenosis at the C6-C7 level. No acute fracture. No aggressive appearing focal osseous lesion or focal pathologic process. Soft tissues and spinal canal: No prevertebral fluid or swelling. No visible canal hematoma. Upper chest: Unremarkable. Other: Atherosclerotic plaque. IMPRESSION: 1.  No acute intracranial abnormality. 2. No acute displaced fracture or traumatic listhesis of the cervical spine. Electronically Signed   By: Tish Frederickson M.D.   On: 10/23/2023 09:56      Assessment and Plan: per H&P note by Bo Merino MD on 12.8.24   * Frequent falls Patient has a history of frequent falls dating the last few months, with numerous falls in the last 2 days complicated by an L1 compression fracture.  Patient denies any pain at this time.   - PT/OT - Pain control with Tylenol   Altered mental status Per chart review, patient has been altered on every hospital/ER visit since the beginning of October.  During this time, her family states that she has had a rapid decline in mentation.  She is only intermittently oriented, frequently lies, and becomes easily confused.  Apparently her home is in disarray and she is actually being evicted from her ALF.  Her  behavior has also become more aggressive and angry, although her family notes she has a pre-existing personality disorder.   Initial concern for UTI contributing, however she received complete treatment prior to discharge on the 12/6.  I have a strong position that she has underlying dementia with the rapid progression in the setting of multiple hospitalization and falls, with worsening behavioral disturbances.   - Patient does not have medical capacity.  Although she may be partly oriented, she does not understand the gravity of her medical decisions - Psychiatry consultation for assistance with behavioral disturbances; appreciate their recommendations - Continue Ativan and Amitizia   Goals of care, counseling/discussion Per discussion with family, given advanced age and suspected underlying dementia, they would want no invasive measures to be taken in the setting of cardiac or pulmonary arrest.  They would like to focus on her comfort.   - DNR/DNI   Type 2 diabetes mellitus with chronic kidney disease, without long-term current use of insulin (HCC) Diet controlled at this time with last A1c of 7.5%.  No indication for SSI   Paroxysmal atrial fibrillation (HCC) - Continue home metoprolol to avoid RVR - Continue home Eliquis   DVT (deep venous thrombosis) (HCC) - Given recent DVT diagnosis, continue home Eliquis   Chronic obstructive pulmonary disease (HCC) - DuoNebs as needed   Essential hypertension - Given advanced age, discontinue home Benzapril.  Given suspected poor p.o. intake, high risk for AKI and no benefit of ACE-i at this point.   Advance Care Planning:   Code Status: Do not attempt resuscitation (DNR) - Comfort care please see goals of care discussion as noted above   Consults: Psychiatry  Discharge Planning:  Ongoing- likely back  to [facility] and hospice working with patient/family to find a higher LOC residence for patient.  Family contact: Family meeting with TOC  and patient's casemanager and SW with hospice reached out to daughters  IDT:  Updated - Hospice RN casemanager spoke with Allena Katz SWTOC at [facility] to discuss d/c options and plan.  Goals of Care- on going  Day 1 only:  Medication list given to unit secretary.  Norris Cross, RN Nurse Liaison [phone]

## 2023-10-24 NOTE — TOC Progression Note (Signed)
Transition of Care [facility]) - Progression Note    Patient Details  Name: Monica [name] MRN: [id number] Date of Birth: Mar 15, 1926  Transition of Care [facility]) CM/SW Contact  Allena Katz, LCSW Phone Number: 10/24/2023, 4:14 PM  Clinical Narrative:   CSW attempted MOON unable to reach daughters. CSW will follow back up later in admission as pt is not fully oriented.          Expected Discharge Plan and Services                                               Social Determinants of Health (SDOH) Interventions SDOH Screenings   Food Insecurity: No Food Insecurity (10/23/2023)  Housing: Low Risk  (10/23/2023)  Transportation Needs: No Transportation Needs (10/23/2023)  Utilities: Not At Risk (10/23/2023)  Alcohol Screen: Low Risk  (04/20/2022)  Depression (PHQ2-9): Low Risk  (12/01/2022)  Tobacco Use: Medium Risk (10/23/2023)    Readmission Risk Interventions    10/19/2023    2:47 PM 10/18/2023   11:18 AM  Readmission Risk Prevention Plan  Transportation Screening Complete Complete  PCP or Specialist Appt within 3-5 Days Complete   HRI or Home Care Consult Complete Complete  Social Work Consult for Recovery Care Planning/Counseling Complete Complete  Palliative Care Screening Complete Complete  Medication Review Ocean[employer]) Complete

## 2023-10-24 NOTE — Evaluation (Signed)
Occupational Therapy Evaluation Patient Details Name: Monica [name] MRN: [id number] DOB: 02-11-1926 Today's Date: 10/24/2023   History of Present Illness Pt is 89 y/o admitted 10/18/23 for DVT and 10/23/23 for frequent falls. S/Sx associated involve weakness and LLE swelling. PmHx includes: COPD, DM, hyperlipidemia, dementia and HTN.   Clinical Impression   Pt was seen for OT evaluation this date. Prior to hospital admission, pt reports she was independent with ADL. No family present to verify. Pt notes she uses a rollator and during session despite gentle education, pt declined use of 2WW and gait belt, becoming increasingly agitated with the encouragement. Pt presents to acute OT demonstrating impaired ADL performance and functional mobility 2/2 decreased strength, balance, activity tolerance, and hx of cognitive decline (See OT problem list for additional functional deficits). Pt currently requires CGA +2 for ADL transfers with HHAx2, set up and supv for unsupported sitting grooming tasks, and MOD A for LB ADL tasks involving STS transfers. Pt declined additional ADL/mobility. Pt would benefit from skilled OT services to address noted impairments and functional limitations (see below for any additional details) in order to maximize safety and independence while minimizing falls risk and caregiver burden.      If plan is discharge home, recommend the following: A lot of help with bathing/dressing/bathroom;Supervision due to cognitive status;Assist for transportation;Assistance with cooking/housework;Help with stairs or ramp for entrance;A little help with walking and/or transfers;Direct supervision/assist for medications management;Direct supervision/assist for financial management    Functional Status Assessment  Patient has had a recent decline in their functional status and demonstrates the ability to make significant improvements in function in a reasonable and predictable amount of time.   Equipment Recommendations  None recommended by OT    Recommendations for Other Services       Precautions / Restrictions Precautions Precautions: Fall Restrictions Weight Bearing Restrictions: No      Mobility Bed Mobility Overal bed mobility: Needs Assistance Bed Mobility: Supine to Sit     Supine to sit: Contact guard, HOB elevated, Used rails     General bed mobility comments: increased time + CGA to assist with trunk management.    Transfers Overall transfer level: Needs assistance Equipment used: 2 person hand held assist Transfers: Sit to/from Stand, Bed to chair/wheelchair/BSC Sit to Stand: Contact guard assist     Step pivot transfers: Contact guard assist     General transfer comment: For all transfers, pt refused the use of AD and therefore used 2+HHA. Increased time/effort to complete.      Balance Overall balance assessment: Needs assistance Sitting-balance support: Feet supported Sitting balance-Leahy Scale: Fair     Standing balance support: Bilateral upper extremity supported, During functional activity Standing balance-Leahy Scale: Fair                             ADL either performed or assessed with clinical judgement   ADL Overall ADL's : Needs assistance/impaired                     Lower Body Dressing: Sitting/lateral leans;Supervision/safety;Moderate assistance;Minimal assistance Lower Body Dressing Details (indicate cue type and reason): Pt able to adjust socks while in seated position from recliner without direct assist but requires MOD A for LB dressing involving ADL transfers.                     Vision  Perception         Praxis         Pertinent Vitals/Pain Pain Assessment Pain Assessment: Faces Faces Pain Scale: Hurts little more Pain Location: LLE Pain Descriptors / Indicators: Grimacing Pain Intervention(s): Monitored during session, Repositioned     Extremity/Trunk  Assessment Upper Extremity Assessment Upper Extremity Assessment: Generalized weakness   Lower Extremity Assessment Lower Extremity Assessment: Generalized weakness       Communication Communication Communication: Hearing impairment Cueing Techniques: Verbal cues   Cognition Arousal: Alert Behavior During Therapy: WFL for tasks assessed/performed, Agitated Overall Cognitive Status: History of cognitive impairments - at baseline                                 General Comments: Pt initially pleasant, oriented to self only, at times became visibly upset when asked to do things she did not want to (declined use of 2WW or gait belt despite gentle education into purpose)     General Comments       Exercises     Shoulder Instructions      Home Living Family/patient expects to be discharged to:: Skilled nursing facility   Available Help at Discharge: Other (Comment) (ILF staff) Type of Home: Independent living facility                           Additional Comments: Resident of [facility]      Prior Functioning/Environment Prior Level of Function : Needs assist             Mobility Comments: Pt reports using a WC at baseline. Pt describes that she performs a STS and step pivot transfer to sit into her WC. ADLs Comments: Pt reports having assistance with bathing and dressing. Pt is IND with toileting and feeding.        OT Problem List: Decreased strength;Decreased range of motion;Decreased activity tolerance;Impaired balance (sitting and/or standing);Decreased knowledge of use of DME or AE;Decreased safety awareness;Decreased cognition      OT Treatment/Interventions: Therapeutic exercise;Therapeutic activities;Self-care/ADL training;DME and/or AE instruction;Patient/family education;Energy conservation;Cognitive remediation/compensation;Balance training    OT Goals(Current goals can be found in the care plan section) Acute Rehab OT  Goals Patient Stated Goal: go home OT Goal Formulation: With patient Time For Goal Achievement: 11/07/23 Potential to Achieve Goals: Fair ADL Goals Pt Will Perform Upper Body Dressing: sitting;with set-up;with supervision Pt Will Perform Lower Body Dressing: sit to/from stand;sitting/lateral leans;with mod assist;with min assist Pt Will Transfer to Toilet: ambulating;bedside commode;with supervision (LRAD) Pt Will Perform Toileting - Clothing Manipulation and hygiene: with supervision;sitting/lateral leans  OT Frequency: Min 1X/week    Co-evaluation PT/OT/SLP Co-Evaluation/Treatment: Yes Reason for Co-Treatment: To address functional/ADL transfers PT goals addressed during session: Mobility/safety with mobility OT goals addressed during session: ADL's and self-care      AM-PAC OT "6 Clicks" Daily Activity     Outcome Measure Help from another person eating meals?: None Help from another person taking care of personal grooming?: A Little Help from another person toileting, which includes using toliet, bedpan, or urinal?: A Lot Help from another person bathing (including washing, rinsing, drying)?: A Lot Help from another person to put on and taking off regular upper body clothing?: A Little Help from another person to put on and taking off regular lower body clothing?: A Lot 6 Click Score: 16   End of Session Equipment Utilized  During Treatment: Other (comment) (handheld assist x2 as pt refused gait belt and RW) Nurse Communication: Mobility status  Activity Tolerance: Patient tolerated treatment well Patient left: in chair;with call bell/phone within reach;with chair alarm set  OT Visit Diagnosis: Muscle weakness (generalized) (M62.81);Other abnormalities of gait and mobility (R26.89)                Time: 1610-9604 OT Time Calculation (min): 17 min Charges:  OT General Charges $OT Visit: 1 Visit OT Evaluation $OT Eval Low Complexity: 1 Low  Arman Filter., MPH, MS, OTR/L ascom  [phone] 10/24/23, 3:24 PM
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
## 2023-10-24 NOTE — Plan of Care (Addendum)
Patient is alert and oriented X 1.denies any pain. No any agitation seen during day shift.   Problem: Education: Goal: Knowledge of General Education information will improve Description: Including pain rating scale, medication(s)/side effects and non-pharmacologic comfort measures Outcome: Progressing   Problem: Health Behavior/Discharge Planning: Goal: Ability to manage health-related needs will improve Outcome: Progressing   Problem: Clinical Measurements: Goal: Ability to maintain clinical measurements within normal limits will improve Outcome: Progressing Goal: Will remain free from infection Outcome: Progressing Goal: Diagnostic test results will improve Outcome: Progressing Goal: Respiratory complications will improve Outcome: Progressing Goal: Cardiovascular complication will be avoided Outcome: Progressing   Problem: Activity: Goal: Risk for activity intolerance will decrease Outcome: Progressing   Problem: Nutrition: Goal: Adequate nutrition will be maintained Outcome: Progressing   Problem: Coping: Goal: Level of anxiety will decrease Outcome: Progressing   Problem: Elimination: Goal: Will not experience complications related to bowel motility Outcome: Progressing Goal: Will not experience complications related to urinary retention Outcome: Progressing   Problem: Pain Management: Goal: General experience of comfort will improve Outcome: Progressing   Problem: Safety: Goal: Ability to remain free from injury will improve Outcome: Progressing   Problem: Skin Integrity: Goal: Risk for impaired skin integrity will decrease Outcome: Progressing

## 2023-10-24 NOTE — TOC Progression Note (Addendum)
Transition of Care [facility]) - Progression Note    Patient Details  Name: Monica [name] MRN: [id number] Date of Birth: November 12, 1926  [facility] of [facility]) CM/SW Contact  Allena Katz, LCSW Phone Number: 10/24/2023, 12:57 PM  Clinical Narrative:   Pt admitted with frequent falls from [facility]. Pt disoriented and agitated at this time. CSW to follow up with family  CSW spoke with [facility] that states they plan to evict patient this week and will give her the letter this week and that she has 30 days to find an alternative arrangement.          Expected Discharge Plan and Services                                               Social Determinants of Health (SDOH) Interventions SDOH Screenings   Food Insecurity: No Food Insecurity (10/23/2023)  Housing: Low Risk  (10/23/2023)  Transportation Needs: No Transportation Needs (10/23/2023)  Utilities: Not At Risk (10/23/2023)  Alcohol Screen: Low Risk  (04/20/2022)  Depression (PHQ2-9): Low Risk  (12/01/2022)  Tobacco Use: Medium Risk (10/23/2023)    Readmission Risk Interventions    10/19/2023    2:47 PM 10/18/2023   11:18 AM  Readmission Risk Prevention Plan  Transportation Screening Complete Complete  PCP or Specialist Appt within 3-5 Days Complete   HRI or Home Care Consult Complete Complete  Social Work Consult for Recovery Care Planning/Counseling Complete Complete  Palliative Care Screening Complete Complete  Medication Review [employer]) Complete

## 2023-10-24 NOTE — Plan of Care (Signed)
Pt became very irritated and anxious earlier in the shift. Attempted to give PRN anxiety medication but patient declined to take anything at that point. Provider notified, IV haldol ordered but not given as patient was calm still declining oral anxiety medication but no longer agitated. Charge RN notified to possibly request private room for patient to help with less stimuli during night. Pt was initially became aggressive due to another care RN having to enter the room and pass her bed to get to another patient in the semi-private room.   Patient distracted with therapeutic calming music on the TV.    Problem: Education: Goal: Knowledge of General Education information will improve Description: Including pain rating scale, medication(s)/side effects and non-pharmacologic comfort measures Outcome: Progressing   Problem: Health Behavior/Discharge Planning: Goal: Ability to manage health-related needs will improve Outcome: Progressing   Problem: Clinical Measurements: Goal: Ability to maintain clinical measurements within normal limits will improve Outcome: Progressing Goal: Will remain free from infection Outcome: Progressing Goal: Diagnostic test results will improve Outcome: Progressing Goal: Respiratory complications will improve Outcome: Progressing Goal: Cardiovascular complication will be avoided Outcome: Progressing   Problem: Activity: Goal: Risk for activity intolerance will decrease Outcome: Progressing   Problem: Nutrition: Goal: Adequate nutrition will be maintained Outcome: Progressing   Problem: Coping: Goal: Level of anxiety will decrease Outcome: Progressing   Problem: Elimination: Goal: Will not experience complications related to bowel motility Outcome: Progressing Goal: Will not experience complications related to urinary retention Outcome: Progressing   Problem: Pain Management: Goal: General experience of comfort will improve Outcome: Progressing    Problem: Safety: Goal: Ability to remain free from injury will improve Outcome: Progressing   Problem: Skin Integrity: Goal: Risk for impaired skin integrity will decrease Outcome: Progressing

## 2023-10-24 NOTE — Progress Notes (Addendum)
Progress Note   Patient: Monica [name]:[id number] DOB: 07-Sep-1926 DOA: 10/23/2023     0 DOS: the patient was seen and examined on 10/24/2023   Brief hospital course: Monica [name] is a 89 y.o. female with medical history significant of COPD, type 2 diabetes, restless leg, hypertension, hyperlipidemia, atrial fibrillation on Eliquis, who presents to the ED due to ground-level falls. Patient also has associated worsening mental status thought to be due to worsening dementia.   Assessment and Plan:   Frequent falls with L1 compression deformity Patient has a history of frequent falls dating the last few months, with numerous falls in the last 2 days complicated by an L1 compression fracture.  Patient denies any pain at this time. PT OT on board Continue pain management Patient unlikely to be a surgical candidate   Acute metabolic encephalopathy likely in the setting of progressive dementia Patient recently completed a course of antibiotics for UTI on 10/21/2023 We will avoid benzodiazepines if at all possible Continue Haldol  Type 2 diabetes mellitus with chronic kidney disease, without long-term current use of insulin (HCC) Diet controlled at this time with last A1c of 7.5%.  No indication for SSI   Paroxysmal atrial fibrillation (HCC) - Continue home metoprolol to avoid RVR Continue home Eliquis   DVT (deep venous thrombosis) (HCC) - Given recent DVT diagnosis, continue home Eliquis   Chronic obstructive pulmonary disease (HCC) - DuoNebs as needed   Essential hypertension - Given advanced age, discontinue home Benzapril.  Given suspected poor p.o. intake, high risk for AKI and no benefit of ACE-i at this point.   Advance Care Planning:   Code Status: Do not attempt resuscitation (DNR) - Comfort care please see goals of care discussion as noted above   Consults: Psychiatry   Family Communication: Patient's daughters updated at bedside  CODE STATUS: DNR/DNI  Subjective:   Patient seen and examined at bedside this morning Still exhibiting symptoms of dementia Denies nausea vomiting abdominal pain chest pain  Physical Exam: Vitals and nursing note reviewed.  Constitutional:      Appearance: She is ill-appearing (chronically).  HENT:     Head: Normocephalic.     Comments: Left raccoon eye Eyes:     Conjunctiva/sclera: Conjunctivae normal.     Pupils: Pupils are equal, round, and reactive to light.  Cardiovascular:  Pulmonary:     Effort: Pulmonary effort is normal. No respiratory distress.     Breath sounds: Normal breath sounds.  Abdominal: Soft some midline tenderness Skin:     Comments: Multiple bruises in various stages of healing  Neurological: Patient awake but exhibiting symptoms of dementia    Disposition: Status is: Observation  Time spent: 56 minutes  Data Reviewed: I reviewed patient's CT scan of the brain, x-ray of the lumbar spine as noted above    Latest Ref Rng & Units 10/23/2023   11:37 AM 10/18/2023    5:29 AM 09/06/2023    1:30 PM  BMP  Glucose 70 - 99 mg/dL 440  102  725   BUN 8 - 23 mg/dL 20  22  13    Creatinine 0.44 - 1.00 mg/dL 3.66  4.40  3.47   Sodium 135 - 145 mmol/L 133  135  136   Potassium 3.5 - 5.1 mmol/L 4.1  4.0  3.6   Chloride 98 - 111 mmol/L 97  100  101   CO2 22 - 32 mmol/L 23  28  24    Calcium 8.9 - 10.3  mg/dL 8.9  9.1  9.6        Latest Ref Rng & Units 10/23/2023   11:37 AM 10/19/2023    5:33 AM 10/18/2023    5:29 AM  CBC  WBC 4.0 - 10.5 K/uL 8.5  5.9  6.1   Hemoglobin 12.0 - 15.0 g/dL 10.2  72.5  36.6   Hematocrit 36.0 - 46.0 % 38.2  37.8  33.6   Platelets 150 - 400 K/uL 223  184  220     Vitals:   10/24/23 0446 10/24/23 0907 10/24/23 0936 10/24/23 1631  BP: (!) 155/63 (!) 147/69 134/71 117/89  Pulse: 65 71 81 61  Resp: 18 18 19 16   Temp: 97.6 F (36.4 C) 98 F (36.7 C) 98.1 F (36.7 C) 97.9 F (36.6 C)  TempSrc:  Oral Oral   SpO2: 99% 99% 98% 98%  Weight:      Height:            Author: Loyce Dys, MD 10/24/2023 5:09 PM  For on call review [email or web address].   Addendum: Patient lacks medical decision making capacity. She does not understand the gravity of her medical condition, she is not able to synthesize what she is being managed for, this is impeded by her progressive dementia.

## 2023-10-25 DIAGNOSIS — I10 Essential (primary) hypertension: Secondary | ICD-10-CM | POA: Diagnosis not present

## 2023-10-25 DIAGNOSIS — K5730 Diverticulosis of large intestine without perforation or abscess without bleeding: Secondary | ICD-10-CM | POA: Diagnosis not present

## 2023-10-25 DIAGNOSIS — R296 Repeated falls: Secondary | ICD-10-CM | POA: Diagnosis not present

## 2023-10-25 DIAGNOSIS — D135 Benign neoplasm of extrahepatic bile ducts: Secondary | ICD-10-CM | POA: Diagnosis not present

## 2023-10-25 DIAGNOSIS — R634 Abnormal weight loss: Secondary | ICD-10-CM | POA: Diagnosis not present

## 2023-10-25 DIAGNOSIS — K831 Obstruction of bile duct: Secondary | ICD-10-CM | POA: Diagnosis not present

## 2023-10-25 DIAGNOSIS — H04123 Dry eye syndrome of bilateral lacrimal glands: Secondary | ICD-10-CM | POA: Diagnosis not present

## 2023-10-25 LAB — CBC WITH DIFFERENTIAL/PLATELET
Abs Immature Granulocytes: 0.02 10*3/uL (ref 0.00–0.07)
Basophils Absolute: 0.1 10*3/uL (ref 0.0–0.1)
Basophils Relative: 1 %
Eosinophils Absolute: 0.3 10*3/uL (ref 0.0–0.5)
Eosinophils Relative: 3 %
HCT: 34.1 % — ABNORMAL LOW (ref 36.0–46.0)
Hemoglobin: 11 g/dL — ABNORMAL LOW (ref 12.0–15.0)
Immature Granulocytes: 0 %
Lymphocytes Relative: 25 %
Lymphs Abs: 1.9 10*3/uL (ref 0.7–4.0)
MCH: 26.1 pg (ref 26.0–34.0)
MCHC: 32.3 g/dL (ref 30.0–36.0)
MCV: 81 fL (ref 80.0–100.0)
Monocytes Absolute: 0.8 10*3/uL (ref 0.1–1.0)
Monocytes Relative: 10 %
Neutro Abs: 4.6 10*3/uL (ref 1.7–7.7)
Neutrophils Relative %: 61 %
Platelets: 270 10*3/uL (ref 150–400)
RBC: 4.21 MIL/uL (ref 3.87–5.11)
RDW: 13.6 % (ref 11.5–15.5)
WBC: 7.6 10*3/uL (ref 4.0–10.5)
nRBC: 0 % (ref 0.0–0.2)

## 2023-10-25 LAB — BASIC METABOLIC PANEL
Anion gap: 7 (ref 5–15)
BUN: 31 mg/dL — ABNORMAL HIGH (ref 8–23)
CO2: 27 mmol/L (ref 22–32)
Calcium: 9 mg/dL (ref 8.9–10.3)
Chloride: 100 mmol/L (ref 98–111)
Creatinine, Ser: 0.78 mg/dL (ref 0.44–1.00)
GFR, Estimated: >60 mL/min (ref >60)
Glucose, Bld: 159 mg/dL — ABNORMAL HIGH (ref 70–99)
Potassium: 3.7 mmol/L (ref 3.5–5.1)
Sodium: 134 mmol/L — ABNORMAL LOW (ref 135–145)

## 2023-10-25 LAB — GLUCOSE, CAPILLARY: Glucose-Capillary: 171 mg/dL — ABNORMAL HIGH (ref 70–99)

## 2023-10-25 NOTE — TOC Progression Note (Signed)
Transition of Care [facility]) - Progression Note    Patient Details  Name: Monica [name] MRN: [id number] Date of Birth: [birth year]  Transition of Care [facility]) CM/SW Contact  Allena Katz, LCSW Phone Number: 10/25/2023, 9:56 AM  Clinical Narrative:   CSW spoke with sister Monica [name] who states that pt's income is 1850 from [place] and 1976 from a pension. CSW explained pt would not qualify for medicaid with that income. CSW suggested family work with a placement agency to try to assist with possibly a family care home. Daughter reports she will get back to me after she talks with her sister to see if that referral should be made. [name] states that on the POA form first listed is her sister [name] who does NOT want to be decision maker. CSW will call amy to confirm this. Second on the POA is daughter Monica [name]. [name] reports she will bring paper work in that shows this. MD is reporting that patient does not have capacity and at this time pt is only oriented to her self. CSW explained to daughter that if she is wanting rehab that we cannot pursue that currently because of her being on comfort and not being in inpatient status. Daughter hopeful pt will change out of observation to pursue a rehab route.   Amys phone number is [phone].          Expected Discharge Plan and Services                                               Social Determinants of Health (SDOH) Interventions SDOH Screenings   Food Insecurity: No Food Insecurity (10/23/2023)  Housing: Low Risk  (10/23/2023)  Transportation Needs: No Transportation Needs (10/23/2023)  Utilities: Not At Risk (10/23/2023)  Alcohol Screen: Low Risk  (04/20/2022)  Depression (PHQ2-9): Low Risk  (12/01/2022)  Tobacco Use: Medium Risk (10/23/2023)    Readmission Risk Interventions    10/19/2023    2:47 PM 10/18/2023   11:18 AM  Readmission Risk Prevention Plan  Transportation Screening Complete Complete  PCP or Specialist Appt within 3-5 Days Complete    HRI or Home Care Consult Complete Complete  Social Work Consult for Recovery Care Planning/Counseling Complete Complete  Palliative Care Screening Complete Complete  Medication Review Oceanographer) Complete

## 2023-10-25 NOTE — Plan of Care (Signed)
Pt has had consistently high BP.  Reached out to on call NP and he was comfortable w/pressure.  Pt doesn't have anything PRN.

## 2023-10-25 NOTE — Progress Notes (Signed)
RN called for TLSO brace [phone]

## 2023-10-25 NOTE — Progress Notes (Signed)
Orthopedic Tech Progress Note Patient Details:  Monica [name] October 12, 1926 [id number] Called in TLSO to [facility] Patient ID: Monica [name], female   DOB: 1925/12/19, 89 y.o.   MRN: [id number]  Monica [name] 10/25/2023, 12:11 PM

## 2023-10-25 NOTE — [place] FL2 (Signed)
[place] MEDICAID FL2 LEVEL OF CARE FORM     IDENTIFICATION  Patient Name: Monica [name] Birthdate: Nov 14, 1926 Sex: female Admission Date (Current Location): 10/23/2023  [facility] and [place] Number:      Facility and Address:  [facility], [address] [place], [place] [address]      Provider Number: [id number]  Attending Physician Name and Address:  Loyce Dys, MD  Relative Name and Phone Number:  Bonnee Quin (Daughter)  [phone] (    Current Level of Care: Hospital Recommended Level of Care:   Prior Approval Number:    Date Approved/Denied:   PASRR Number: [id number]  Discharge Plan: SNF    Current Diagnoses: Patient Active Problem List   Diagnosis Date Noted   Frequent falls 10/23/2023   Altered mental status 10/23/2023   Paroxysmal atrial fibrillation (HCC) 10/23/2023   Agitation 10/23/2023   History of stroke 10/20/2023   Constipation 10/19/2023   Generalized weakness 10/19/2023   DVT (deep venous thrombosis) (HCC) 10/18/2023   Goals of care, counseling/discussion 08/19/2023   Choledocholithiasis 08/19/2023   Severe sepsis with acute organ dysfunction (HCC) 08/19/2023   Septic shock (HCC) 08/19/2023   History of tobacco use 08/19/2023   GI bleed 04/13/2021   Right hip pain 11/02/2020   S/P hip replacement, right 11/02/2020   Need for vaccination against Streptococcus pneumoniae using pneumococcal conjugate vaccine 13 11/02/2020   Microcytic anemia    DOE (dyspnea on exertion) 09/09/2020   Acute on chronic anemia 09/08/2020   Fracture of femoral neck, right (HCC) 04/19/2020   Diabetes mellitus without complication (HCC) 04/19/2020   Fall at home, initial encounter 04/19/2020   Supratherapeutic INR 11/22/2019   Scalp hematoma, subsequent encounter 11/17/2019   Scalp laceration, subsequent encounter 10/26/2019   Muscle cramping 10/26/2019   Long term current use of anticoagulant therapy 10/26/2019   Encounter for  general adult medical examination with abnormal findings 08/21/2019   Cellulitis of right buttock 07/25/2019   Diabetic neuropathy (HCC) 06/25/2019   Type 2 diabetes mellitus with chronic kidney disease, without long-term current use of insulin (HCC) 06/25/2019   Pressure injury of skin 06/05/2019   'Light-for-dates' infant with signs of fetal malnutrition 05/31/2019   Closed left hip fracture (HCC) 05/31/2019   Inflammatory polyarthritis (HCC) 05/09/2019   Deep vein thrombosis (DVT) of non-extremity vein 11/29/2018   Edema of left lower extremity 11/13/2018   Cellulitis of left lower leg 11/01/2018   Encounter for therapeutic drug level monitoring 07/05/2018   Allergic contact dermatitis 04/19/2018   Atopic dermatitis 03/15/2018   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   Atrial fibrillation, chronic (HCC) 02/12/2018   Melena    Urinary tract infection with hematuria 01/04/2018   Chronic obstructive pulmonary disease (HCC) 12/27/2017   Acute cystitis with hematuria 12/27/2017   Dysuria 12/27/2017   Urinary frequency 12/27/2017   Uncontrolled type 2 diabetes mellitus with hyperglycemia (HCC) 12/27/2017   Iron deficiency anemia due to chronic blood loss 09/28/2016   Avitaminosis D 09/09/2016   B12 deficiency 09/09/2016   Temporary cerebral vascular dysfunction 09/09/2016   Spinal stenosis 09/09/2016   Pain in shoulder 09/09/2016   Restless legs syndrome 09/09/2016   Personal history of urinary calculi 09/09/2016   H/O deep venous thrombosis 09/09/2016   Gout 09/09/2016   Accumulation of fluid in tissues 09/09/2016   Carpal tunnel syndrome 09/09/2016   Chronic lung disease 09/09/2016   Cataract 09/09/2016   Appendicular ataxia 09/09/2016   Airway hyperreactivity 09/09/2016  Rectal bleeding    Acute blood loss anemia    Gastrointestinal hemorrhage 08/08/2016   Carcinoma of unknown primary (HCC) 08/02/2016   Diabetes (HCC) 05/08/2015   Arthropathy of hand 02/04/2010    Hypertonicity of bladder 08/31/2009   Detrusor instability of bladder 06/18/2009   Pure hypercholesterolemia 04/10/2009   Essential hypertension 04/10/2009   Diverticulitis of colon 04/10/2009    Orientation RESPIRATION BLADDER Height & Weight     Self  Normal Incontinent Weight: 125 lb (56.7 kg) Height:  5\' 4"  (162.6 cm) (stated)  BEHAVIORAL SYMPTOMS/MOOD NEUROLOGICAL BOWEL NUTRITION STATUS      Incontinent    AMBULATORY STATUS COMMUNICATION OF NEEDS Skin   Extensive Assist Verbally  (Pressure injury to the buttocks stage 2 and PI to the buttocks stage 1)                       Personal Care Assistance Level of Assistance  Feeding, Dressing Bathing Assistance: Maximum assistance Feeding assistance: Limited assistance Dressing Assistance: Maximum assistance     Functional Limitations Info  Sight, Speech, Hearing Sight Info: Impaired Hearing Info: Impaired Speech Info: Adequate    SPECIAL CARE FACTORS FREQUENCY  PT (By licensed PT), OT (By licensed OT)     PT Frequency: 5 times a week OT Frequency: 5 times a week            Contractures      Additional Factors Info  Code Status, Allergies Code Status Info: DNR-Comfort Allergies Info: Sulfa Antibiotics  Celecoxib  Acetaminophen  Codeine  Lyrica (Pregabalin)  Penicillin G  Petrolatum-zinc Oxide           Current Medications (10/25/2023):  This is the current hospital active medication list Current Facility-Administered Medications  Medication Dose Route Frequency Provider Last Rate Last Admin   acetaminophen (TYLENOL) tablet 650 mg  650 mg Oral Q6H PRN Verdene Lennert, MD       Or   acetaminophen (TYLENOL) suppository 650 mg  650 mg Rectal Q6H PRN Verdene Lennert, MD       apixaban (ELIQUIS) tablet 10 mg  10 mg Oral BID Verdene Lennert, MD   10 mg at 10/25/23 5621   Followed by   Melene Muller ON 10/26/2023] apixaban (ELIQUIS) tablet 5 mg  5 mg Oral BID Verdene Lennert, MD       haloperidol lactate (HALDOL)  injection 1 mg  1 mg Intravenous Once Jawo, Modou L, NP       LORazepam (ATIVAN) tablet 0.5 mg  0.5 mg Oral Q8H PRN Verdene Lennert, MD       lubiprostone (AMITIZA) capsule 8 mcg  8 mcg Oral BID WC Verdene Lennert, MD   8 mcg at 10/25/23 0929   metoprolol succinate (TOPROL-XL) 24 hr tablet 25 mg  25 mg Oral Daily Verdene Lennert, MD   25 mg at 10/24/23 0909   ondansetron (ZOFRAN) tablet 4 mg  4 mg Oral Q6H PRN Verdene Lennert, MD       Or   ondansetron (ZOFRAN) injection 4 mg  4 mg Intravenous Q6H PRN Verdene Lennert, MD       polyethylene glycol (MIRALAX / GLYCOLAX) packet 17 g  17 g Oral Daily PRN Verdene Lennert, MD         Discharge Medications: Please see discharge summary for a list of discharge medications.  Relevant Imaging Results:  Relevant Lab Results:   Additional Information SSN: [id number]  Allena Katz, LCSW

## 2023-10-25 NOTE — Care Management Obs Status (Signed)
MEDICARE OBSERVATION STATUS NOTIFICATION   Patient Details  Name: Monica [name] MRN: [id number] Date of Birth: 1926/01/31   Medicare Observation Status Notification Given:  Yes    Mayla Biddy, LCSW 10/25/2023, 9:25 AM

## 2023-10-25 NOTE — TOC Progression Note (Signed)
Transition of Care [facility]) - Progression Note    Patient Details  Name: Monica [name] MRN: [id number] Date of Birth: 04/14/1926  [facility]) CM/SW Contact  Allena Katz, LCSW Phone Number: 10/25/2023, 1:19 PM  Clinical Narrative:   CSW spoke with Daughter [name] who states that she does NOT want to make medical decisions for her mother and would like to appoint her sister Hollie Salk who is listed second on the POA.         Expected Discharge Plan and Services                                               Social Determinants of Health (SDOH) Interventions SDOH Screenings   Food Insecurity: No Food Insecurity (10/23/2023)  Housing: Low Risk  (10/23/2023)  Transportation Needs: No Transportation Needs (10/23/2023)  Utilities: Not At Risk (10/23/2023)  Alcohol Screen: Low Risk  (04/20/2022)  Depression (PHQ2-9): Low Risk  (12/01/2022)  Tobacco Use: Medium Risk (10/23/2023)    Readmission Risk Interventions    10/19/2023    2:47 PM 10/18/2023   11:18 AM  Readmission Risk Prevention Plan  Transportation Screening Complete Complete  PCP or Specialist Appt within 3-5 Days Complete   HRI or Home Care Consult Complete Complete  Social Work Consult for Recovery Care Planning/Counseling Complete Complete  Palliative Care Screening Complete Complete  Medication Review Oceanographer) Complete

## 2023-10-25 NOTE — TOC Progression Note (Signed)
Transition of Care [facility]) - Progression Note    Patient Details  Name: Monica [name] MRN: [id number] Date of Birth: May 03, 1926  Transition of Care [facility]) CM/SW Contact  Allena Katz, LCSW Phone Number: 10/25/2023, 3:20 PM  Clinical Narrative:   POA paperwork provided by daughter [name]. Copy placed on the chart and original given back to daughter.          Expected Discharge Plan and Services                                               Social Determinants of Health (SDOH) Interventions SDOH Screenings   Food Insecurity: No Food Insecurity (10/23/2023)  Housing: Low Risk  (10/23/2023)  Transportation Needs: No Transportation Needs (10/23/2023)  Utilities: Not At Risk (10/23/2023)  Alcohol Screen: Low Risk  (04/20/2022)  Depression (PHQ2-9): Low Risk  (12/01/2022)  Tobacco Use: Medium Risk (10/23/2023)    Readmission Risk Interventions    10/19/2023    2:47 PM 10/18/2023   11:18 AM  Readmission Risk Prevention Plan  Transportation Screening Complete Complete  PCP or Specialist Appt within 3-5 Days Complete   HRI or Home Care Consult Complete Complete  Social Work Consult for Recovery Care Planning/Counseling Complete Complete  Palliative Care Screening Complete Complete  Medication Review Oceanographer) Complete

## 2023-10-25 NOTE — Progress Notes (Signed)
[facility] Patient   Ms. Denaya Hotchkiss is a current Physicist, medical patient followed at [facility] with a diagnosis of abnormal weight loss.  She is admitted to Observation status to [facility] on 12.8.24 with diagnosis frequent falls. Per Dr. Patric Dykes with [facility] this is a related hospital admission.    Met at bedside with Ms. Bardin. Patient alert and pleasant this am.  She appears to be oriented to self only.  Patient observed sitting up in bed, feeding herself breakfast.     Patient is appropriate for GIP LOC requiring continuous care to monitor patient's safety, behavorial disturbances with ongoing assessment for interventions including PO ativan.    Vital Signs:  T 98.6,  BP 143/62, P 59, R 17,  Oxi 94% on RA  I/O:  580/300  Abnormal Labs: Glucose 171,  Sodium 134,  BUN 31, Hemoglobin 11.0, HCT 34.1   Diagnostics:No new diagnositcs.   IV/PRN medication:  None-patient has been refusing some medications.   Assessment and Plan:  Frequent falls with L1 compression deformity Patient has a history of frequent falls dating the last few months, with numerous falls in the last 2 days complicated by an L1 compression fracture.  Patient denies any pain at this time. Continue PT OT Continue pain management I discussed L1 compression deformity with Dr. Ernestine Mcmurray neurosurgeon who recommended TLSO brace for comfort and patient can follow-up as an outpatient with upright x-rays.   Acute metabolic encephalopathy likely in the setting of progressive dementia Patient recently completed a course of antibiotics for UTI on 10/21/2023 We will avoid benzodiazepines if at all possible Continue Haldol   Type 2 diabetes mellitus with chronic kidney disease, without long-term current use of insulin (HCC) Diet controlled at this time with last A1c of 7.5%.  No indication for SSI   Paroxysmal atrial fibrillation (HCC) - Continue home metoprolol to avoid  RVR Continue home Eliquis   DVT (deep venous thrombosis) (HCC) - Given recent DVT diagnosis, continue home Eliquis   Chronic obstructive pulmonary disease (HCC) - DuoNebs as needed   Essential hypertension - Given advanced age, discontinue home Benzapril.  Given suspected poor p.o. intake, high risk for AKI and no benefit of ACE-i at this point.   Advance Care Planning:   Code Status: Do not attempt resuscitation (DNR) - Comfort care please see goals of care discussion as noted above   Consults: Psychiatry    Discharge Planning:  Ongoing- TOC has spoken with patient's daughter, Hollie Salk, today.  She is hopeful that patient's admission status will be changed from observation to inpatient.  She would like for patient to go to rehab post discharge from the hospital. If not, patient may discharge to an assisted living facility. MD stating patient lacks capacity to make decisions for herself at this time. Daughter brought HCPOA documents to the hospital today .    Family contact: None.  Met with patient at bedside.  TOC spoke with daughter today about Assisted living placement vs skilled rehab. Left voicemail message with daughter, Rhodia Albright.     IDT:  Updated - Hospice RN casemanager spoke with Allena Katz SWTOC at [facility] to discuss d/c options and plan.   Goals of Care- on going   [facility] Liaison [phone]

## 2023-10-25 NOTE — TOC Progression Note (Signed)
Transition of Care [facility]) - Progression Note    Patient Details  Name: Monica [name] MRN: [id number] Date of Birth: February 13, 1926  Transition of Care [facility]) CM/SW Contact  Allena Katz, LCSW Phone Number: 10/25/2023, 3:15 PM  Clinical Narrative:   Message sent to Christia Reading medicaid specialist to get application started for medicaid.          Expected Discharge Plan and Services                                               Social Determinants of Health (SDOH) Interventions SDOH Screenings   Food Insecurity: No Food Insecurity (10/23/2023)  Housing: Low Risk  (10/23/2023)  Transportation Needs: No Transportation Needs (10/23/2023)  Utilities: Not At Risk (10/23/2023)  Alcohol Screen: Low Risk  (04/20/2022)  Depression (PHQ2-9): Low Risk  (12/01/2022)  Tobacco Use: Medium Risk (10/23/2023)    Readmission Risk Interventions    10/19/2023    2:47 PM 10/18/2023   11:18 AM  Readmission Risk Prevention Plan  Transportation Screening Complete Complete  PCP or Specialist Appt within 3-5 Days Complete   HRI or Home Care Consult Complete Complete  Social Work Consult for Recovery Care Planning/Counseling Complete Complete  Palliative Care Screening Complete Complete  Medication Review Oceanographer) Complete

## 2023-10-25 NOTE — Progress Notes (Addendum)
Progress Note   Patient: Monica [name] ZOX:[id number] DOB: 07-31-1926 DOA: 10/23/2023     0 DOS: the patient was seen and examined on 10/25/2023  Brief hospital course: Monica [name] is a 89 y.o. female with medical history significant of COPD, type 2 diabetes, restless leg, hypertension, hyperlipidemia, atrial fibrillation on Eliquis, who presents to the ED due to ground-level falls. Patient also has associated worsening mental status thought to be due to worsening dementia.     Assessment and Plan:    Frequent falls with L1 compression deformity Patient has a history of frequent falls dating the last few months, with numerous falls in the last 2 days complicated by an L1 compression fracture.  Patient denies any pain at this time. Continue PT OT Continue pain management I discussed L1 compression deformity with Dr. Ernestine Mcmurray neurosurgeon who recommended TLSO brace for comfort and patient can follow-up as an outpatient with upright x-rays.  Acute metabolic encephalopathy likely in the setting of progressive dementia Patient recently completed a course of antibiotics for UTI on 10/21/2023 We will avoid benzodiazepines if at all possible Patient lacks medical decision making capacity. She does not understand the gravity of her medical condition, she is not able to synthesize what she is being managed for, this is impeded by her progressive dementia. Continue Haldol   Type 2 diabetes mellitus with chronic kidney disease, without long-term current use of insulin (HCC) Diet controlled at this time with last A1c of 7.5%.  No indication for SSI   Paroxysmal atrial fibrillation (HCC) - Continue home metoprolol to avoid RVR Continue home Eliquis   DVT (deep venous thrombosis) (HCC) - Given recent DVT diagnosis, continue home Eliquis   Chronic obstructive pulmonary disease (HCC) - DuoNebs as needed   Essential hypertension - Given advanced age, discontinue home Benzapril.  Given  suspected poor p.o. intake, high risk for AKI and no benefit of ACE-i at this point.   Advance Care Planning:   Code Status: Do not attempt resuscitation (DNR) - Comfort care please see goals of care discussion as noted above   Consults: Psychiatry   Family Communication: Patient's daughters updated at bedside   CODE STATUS: DNR/DNI   Subjective:  Patient seen and examined at bedside this morning She had taking off her clothes Exhibiting symptoms of dementia Not agitated at this time Denies nausea vomiting cough or chest pain   Physical Exam: Vitals and nursing note reviewed.  Constitutional:      Appearance: She is ill-appearing (chronically).  HENT:     Head: Normocephalic.     Comments: Left raccoon eye-improved Eyes:     Conjunctiva/sclera: Conjunctivae normal.     Pupils: Pupils are equal, round, and reactive to light.  Cardiovascular:  Pulmonary:     Effort: Pulmonary effort is normal. No respiratory distress.     Breath sounds: Normal breath sounds.  Abdominal: Soft some midline tenderness Skin:     Comments: Multiple bruises in various stages of healing  Neurological: Patient awake but exhibiting symptoms of dementia     Disposition: Status is: Observation Patient's family is looking to have patient placed in a facility PT has recommended skilled nursing facility/short-term rehab   Time spent: 46 minutes   Data Reviewed: I reviewed patient's CT scan of the brain, x-ray of the lumbar spine as noted above    Latest Ref Rng & Units 10/25/2023    4:30 AM 10/23/2023   11:37 AM 10/18/2023    5:29 AM  BMP  Glucose 70 - 99 mg/dL 696  295  284   BUN 8 - 23 mg/dL 31  20  22    Creatinine 0.44 - 1.00 mg/dL 1.32  4.40  1.02   Sodium 135 - 145 mmol/L 134  133  135   Potassium 3.5 - 5.1 mmol/L 3.7  4.1  4.0   Chloride 98 - 111 mmol/L 100  97  100   CO2 22 - 32 mmol/L 27  23  28    Calcium 8.9 - 10.3 mg/dL 9.0  8.9  9.1     Vitals:   10/25/23 0443 10/25/23 0734  10/25/23 0900 10/25/23 1535  BP: (!) 149/72 (!) 171/61 (!) 143/62 (!) 164/55  Pulse: 83 (!) 54 (!) 59 69  Resp: 17 17  20   Temp: 97.8 F (36.6 C) 98.6 F (37 C)  98.4 F (36.9 C)  TempSrc:  Oral  Axillary  SpO2: 95% 94%  97%  Weight:      Height:          Latest Ref Rng & Units 10/25/2023    4:30 AM 10/23/2023   11:37 AM 10/19/2023    5:33 AM  CBC  WBC 4.0 - 10.5 K/uL 7.6  8.5  5.9   Hemoglobin 12.0 - 15.0 g/dL 72.5  36.6  44.0   Hematocrit 36.0 - 46.0 % 34.1  38.2  37.8   Platelets 150 - 400 K/uL 270  223  184      Author: Loyce Dys, MD 10/25/2023 4:19 PM  For on call review [email or web address].

## 2023-10-25 NOTE — Plan of Care (Signed)
?  Problem: Elimination: ?Goal: Will not experience complications related to urinary retention ?Outcome: Progressing ?  ?

## 2023-10-26 DIAGNOSIS — K5909 Other constipation: Secondary | ICD-10-CM | POA: Diagnosis not present

## 2023-10-26 DIAGNOSIS — G2581 Restless legs syndrome: Secondary | ICD-10-CM | POA: Diagnosis not present

## 2023-10-26 DIAGNOSIS — F03918 Unspecified dementia, unspecified severity, with other behavioral disturbance: Secondary | ICD-10-CM | POA: Diagnosis present

## 2023-10-26 DIAGNOSIS — K5730 Diverticulosis of large intestine without perforation or abscess without bleeding: Secondary | ICD-10-CM | POA: Diagnosis not present

## 2023-10-26 DIAGNOSIS — Z8673 Personal history of transient ischemic attack (TIA), and cerebral infarction without residual deficits: Secondary | ICD-10-CM | POA: Diagnosis not present

## 2023-10-26 DIAGNOSIS — I129 Hypertensive chronic kidney disease with stage 1 through stage 4 chronic kidney disease, or unspecified chronic kidney disease: Secondary | ICD-10-CM | POA: Diagnosis present

## 2023-10-26 DIAGNOSIS — Z87891 Personal history of nicotine dependence: Secondary | ICD-10-CM | POA: Diagnosis not present

## 2023-10-26 DIAGNOSIS — N182 Chronic kidney disease, stage 2 (mild): Secondary | ICD-10-CM | POA: Diagnosis present

## 2023-10-26 DIAGNOSIS — Y92099 Unspecified place in other non-institutional residence as the place of occurrence of the external cause: Secondary | ICD-10-CM | POA: Diagnosis not present

## 2023-10-26 DIAGNOSIS — Z885 Allergy status to narcotic agent status: Secondary | ICD-10-CM | POA: Diagnosis not present

## 2023-10-26 DIAGNOSIS — R296 Repeated falls: Secondary | ICD-10-CM | POA: Diagnosis not present

## 2023-10-26 DIAGNOSIS — M4856XA Collapsed vertebra, not elsewhere classified, lumbar region, initial encounter for fracture: Secondary | ICD-10-CM | POA: Diagnosis present

## 2023-10-26 DIAGNOSIS — F609 Personality disorder, unspecified: Secondary | ICD-10-CM | POA: Diagnosis present

## 2023-10-26 DIAGNOSIS — F419 Anxiety disorder, unspecified: Secondary | ICD-10-CM | POA: Diagnosis not present

## 2023-10-26 DIAGNOSIS — Z882 Allergy status to sulfonamides status: Secondary | ICD-10-CM | POA: Diagnosis not present

## 2023-10-26 DIAGNOSIS — E1122 Type 2 diabetes mellitus with diabetic chronic kidney disease: Secondary | ICD-10-CM | POA: Diagnosis present

## 2023-10-26 DIAGNOSIS — Z880 Allergy status to penicillin: Secondary | ICD-10-CM | POA: Diagnosis not present

## 2023-10-26 DIAGNOSIS — G9341 Metabolic encephalopathy: Secondary | ICD-10-CM | POA: Diagnosis not present

## 2023-10-26 DIAGNOSIS — I10 Essential (primary) hypertension: Secondary | ICD-10-CM | POA: Diagnosis not present

## 2023-10-26 DIAGNOSIS — Z86718 Personal history of other venous thrombosis and embolism: Secondary | ICD-10-CM | POA: Diagnosis not present

## 2023-10-26 DIAGNOSIS — Z66 Do not resuscitate: Secondary | ICD-10-CM | POA: Diagnosis present

## 2023-10-26 DIAGNOSIS — H04123 Dry eye syndrome of bilateral lacrimal glands: Secondary | ICD-10-CM | POA: Diagnosis not present

## 2023-10-26 DIAGNOSIS — I480 Paroxysmal atrial fibrillation: Secondary | ICD-10-CM | POA: Diagnosis not present

## 2023-10-26 DIAGNOSIS — Z751 Person awaiting admission to adequate facility elsewhere: Secondary | ICD-10-CM | POA: Diagnosis not present

## 2023-10-26 DIAGNOSIS — D135 Benign neoplasm of extrahepatic bile ducts: Secondary | ICD-10-CM | POA: Diagnosis not present

## 2023-10-26 DIAGNOSIS — E785 Hyperlipidemia, unspecified: Secondary | ICD-10-CM | POA: Diagnosis not present

## 2023-10-26 DIAGNOSIS — Z886 Allergy status to analgesic agent status: Secondary | ICD-10-CM | POA: Diagnosis not present

## 2023-10-26 DIAGNOSIS — R634 Abnormal weight loss: Secondary | ICD-10-CM | POA: Diagnosis not present

## 2023-10-26 DIAGNOSIS — Z96643 Presence of artificial hip joint, bilateral: Secondary | ICD-10-CM | POA: Diagnosis present

## 2023-10-26 DIAGNOSIS — E1165 Type 2 diabetes mellitus with hyperglycemia: Secondary | ICD-10-CM | POA: Diagnosis not present

## 2023-10-26 DIAGNOSIS — S32010D Wedge compression fracture of first lumbar vertebra, subsequent encounter for fracture with routine healing: Secondary | ICD-10-CM | POA: Diagnosis not present

## 2023-10-26 DIAGNOSIS — K831 Obstruction of bile duct: Secondary | ICD-10-CM | POA: Diagnosis not present

## 2023-10-26 DIAGNOSIS — Z7901 Long term (current) use of anticoagulants: Secondary | ICD-10-CM | POA: Diagnosis not present

## 2023-10-26 DIAGNOSIS — J449 Chronic obstructive pulmonary disease, unspecified: Secondary | ICD-10-CM | POA: Diagnosis present

## 2023-10-26 DIAGNOSIS — Z833 Family history of diabetes mellitus: Secondary | ICD-10-CM | POA: Diagnosis not present

## 2023-10-26 DIAGNOSIS — W19XXXA Unspecified fall, initial encounter: Secondary | ICD-10-CM | POA: Diagnosis present

## 2023-10-26 LAB — CBC WITH DIFFERENTIAL/PLATELET
Abs Immature Granulocytes: 0.02 10*3/uL (ref 0.00–0.07)
Basophils Absolute: 0.1 10*3/uL (ref 0.0–0.1)
Basophils Relative: 1 %
Eosinophils Absolute: 0.3 10*3/uL (ref 0.0–0.5)
Eosinophils Relative: 4 %
HCT: 33.3 % — ABNORMAL LOW (ref 36.0–46.0)
Hemoglobin: 10.7 g/dL — ABNORMAL LOW (ref 12.0–15.0)
Immature Granulocytes: 0 %
Lymphocytes Relative: 27 %
Lymphs Abs: 1.8 10*3/uL (ref 0.7–4.0)
MCH: 26.2 pg (ref 26.0–34.0)
MCHC: 32.1 g/dL (ref 30.0–36.0)
MCV: 81.6 fL (ref 80.0–100.0)
Monocytes Absolute: 0.7 10*3/uL (ref 0.1–1.0)
Monocytes Relative: 10 %
Neutro Abs: 3.9 10*3/uL (ref 1.7–7.7)
Neutrophils Relative %: 58 %
Platelets: 265 10*3/uL (ref 150–400)
RBC: 4.08 MIL/uL (ref 3.87–5.11)
RDW: 13.8 % (ref 11.5–15.5)
WBC: 6.6 10*3/uL (ref 4.0–10.5)
nRBC: 0 % (ref 0.0–0.2)

## 2023-10-26 LAB — BASIC METABOLIC PANEL
Anion gap: 6 (ref 5–15)
BUN: 23 mg/dL (ref 8–23)
CO2: 26 mmol/L (ref 22–32)
Calcium: 8.8 mg/dL — ABNORMAL LOW (ref 8.9–10.3)
Chloride: 103 mmol/L (ref 98–111)
Creatinine, Ser: 0.69 mg/dL (ref 0.44–1.00)
GFR, Estimated: >60 mL/min (ref >60)
Glucose, Bld: 160 mg/dL — ABNORMAL HIGH (ref 70–99)
Potassium: 3.7 mmol/L (ref 3.5–5.1)
Sodium: 135 mmol/L (ref 135–145)

## 2023-10-26 MED ORDER — HYDRALAZINE HCL 50 MG PO TABS
25.0000 mg | ORAL_TABLET | Freq: Four times a day (QID) | ORAL | Status: DC | PRN
  Administered 2023-10-26 – 2023-10-28 (×3): 25 mg via ORAL
  Filled 2023-10-26 (×3): qty 1

## 2023-10-26 MED ORDER — BISACODYL 10 MG RE SUPP: 10.0000 mg | Freq: Every day | RECTAL | Status: DC | PRN

## 2023-10-26 MED ORDER — AMLODIPINE BESYLATE 5 MG PO TABS
5.0000 mg | ORAL_TABLET | Freq: Every day | ORAL | Status: DC
  Administered 2023-10-26: 5 mg via ORAL
  Filled 2023-10-26: qty 1

## 2023-10-26 MED ORDER — MAGNESIUM HYDROXIDE 400 MG/5ML PO SUSP
15.0000 mL | Freq: Every day | ORAL | Status: DC | PRN
  Administered 2023-10-26: 15 mL via ORAL
  Filled 2023-10-26: qty 30

## 2023-10-26 MED ORDER — SENNOSIDES-DOCUSATE SODIUM 8.6-50 MG PO TABS
1.0000 | ORAL_TABLET | Freq: Two times a day (BID) | ORAL | Status: DC
  Administered 2023-10-26 – 2023-10-29 (×7): 1 via ORAL
  Filled 2023-10-26 (×7): qty 1

## 2023-10-26 MED ORDER — TRAMADOL HCL 50 MG PO TABS
50.0000 mg | ORAL_TABLET | Freq: Four times a day (QID) | ORAL | Status: DC | PRN
  Administered 2023-10-26 – 2023-10-29 (×3): 50 mg via ORAL
  Filled 2023-10-26 (×3): qty 1

## 2023-10-26 MED ORDER — KETOROLAC TROMETHAMINE 15 MG/ML IJ SOLN: 15.0000 mg | Freq: Four times a day (QID) | INTRAMUSCULAR | Status: DC | PRN

## 2023-10-26 MED ORDER — SIMETHICONE 80 MG PO CHEW
80.0000 mg | CHEWABLE_TABLET | Freq: Four times a day (QID) | ORAL | Status: DC | PRN
  Filled 2023-10-26: qty 1

## 2023-10-26 NOTE — Plan of Care (Signed)
  Problem: Activity: Goal: Risk for activity intolerance will decrease Outcome: Progressing   Problem: Nutrition: Goal: Adequate nutrition will be maintained Outcome: Progressing   Problem: Safety: Goal: Ability to remain free from injury will improve Outcome: Progressing   Problem: Skin Integrity: Goal: Risk for impaired skin integrity will decrease Outcome: Progressing   Problem: Pain Management: Goal: General experience of comfort will improve Outcome: Progressing

## 2023-10-26 NOTE — TOC Progression Note (Signed)
Transition of Care [facility]) - Progression Note    Patient Details  Name: Monica [name] MRN: [id number] Date of Birth: [birth year]  Transition of [facility]) CM/SW Contact  Allena Katz, LCSW Phone Number: 10/26/2023, 2:36 PM  Clinical Narrative:   CSW spoke with POA [name] to inform her that patient had switched over to inpatient. Patient will have her qualifying stay on Saturday. Brianna with [facility] says they can accept Saturday. She says they also can take pt for LTC.          Expected Discharge Plan and Services                                               Social Determinants of Health (SDOH) Interventions SDOH Screenings   Food Insecurity: No Food Insecurity (10/23/2023)  Housing: Low Risk  (10/23/2023)  Transportation Needs: No Transportation Needs (10/23/2023)  Utilities: Not At Risk (10/23/2023)  Alcohol Screen: Low Risk  (04/20/2022)  Depression (PHQ2-9): Low Risk  (12/01/2022)  Tobacco Use: Medium Risk (10/23/2023)    Readmission Risk Interventions    10/19/2023    2:47 PM 10/18/2023   11:18 AM  Readmission Risk Prevention Plan  Transportation Screening Complete Complete  PCP or Specialist Appt within 3-5 Days Complete   HRI or Home Care Consult Complete Complete  Social Work Consult for Recovery Care Planning/Counseling Complete Complete  Palliative Care Screening Complete Complete  Medication Review [employer]) Complete

## 2023-10-26 NOTE — Progress Notes (Signed)
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 She is admitted to Observation status to [facility] on 12.8.24 with diagnosis frequent falls. Per Dr. Patric Dykes with [facility] this is a related hospital admission.   Patient sitting up in bedside chair after just having bath with CNA. Alert and verbally appropriate at this time. She reports she is feeling better and just wants to go home. Attempted to discuss that currently back to [name] might not be an option but patient does not want to discuss further. Per TOC patient has been changed to inpatient status and has been accepted at [facility] for transfer on Saturday.  Patient is appropriate for GIP LOC requiring continuous care to monitor patient's safety, behavorial disturbances with ongoing assessment for interventions including PO Ativan, as well as unsafe discharge plan.  Vital Signs- 97.9/88/17    156/58      94% room air I&O- 240/400 Abnormal Labs-  Ca+ 8.8, Albumin 3.1, Hgb 10.7, Hct 33.3 Diagnostics-  None new  IV/PRN Meds- Miralax 17g po once Problem List as per progress note Dr. Esaw Monica [name] 12.11.24 Frequent falls with L1 compression deformity Patient has a history of frequent falls dating the last few months, with numerous falls in the last 2 days complicated by an L1 compression fracture.  Patient denies any pain at this time. --Dr. Meriam Sprague discussed L1 compression deformity with Dr. Ernestine Mcmurray neurosurgeon who recommended TLSO brace for comfort and follow-up outpatient for upright x-rays. --Continue PT OT --Continue pain management   Acute metabolic encephalopathy likely in the setting of progressive dementia Patient recently completed a course of antibiotics for UTI on 10/21/2023 Patient lacks medical decision making capacity.  She does not understand the gravity of her medical  condition, she is not able to synthesize what she is being managed for, this is impeded by her progressive dementia. --Continue Haldol PRN --Avoid benzo's --Appreciate psychiatry's input   Type 2 diabetes mellitus with chronic kidney disease, without long-term current use of insulin (HCC) Diet controlled at this time with last A1c of 7.5%.   Monitor fasting glucose for now, initiate sliding scale Novolog if indicated   Paroxysmal atrial fibrillation (HCC) - Continue home metoprolol and Eliquis   DVT (deep venous thrombosis) - recent diagnosis --Continue home Eliquis   Chronic obstructive pulmonary disease (HCC) - DuoNebs as needed   Essential hypertension - Given advanced age, discontinue home Benzapril.  Given suspected poor p.o. intake, high risk for AKI and no benefit of ACE-i at this point.   Discharge Planning- Ongoing, likely Monica [name] this weekend Family Contact- None, left voicemail for daughter IDT: updated Goals of Care: DNR   Thank you for the opportunity to participate in this patient's care, please don't hesitate to call for any hospice related questions or concerns.  Thea Gist BSN, [facility] Liaison [phone]

## 2023-10-26 NOTE — Progress Notes (Addendum)
Progress Note   Patient: Monica [name]:[id number] DOB: [birth year] DOA: 10/23/2023     0 DOS: the patient was seen and examined on 10/26/2023  Brief hospital course: Monica [name] is a 89 y.o. female with medical history significant of COPD, type 2 diabetes, restless leg, hypertension, hyperlipidemia, atrial fibrillation on Eliquis, who presents to the ED due to ground-level falls. Patient also has associated worsening mental status thought to be due to worsening dementia.     Assessment and Plan:    Frequent falls with L1 compression deformity Patient has a history of frequent falls dating the last few months, with numerous falls in the last 2 days complicated by an L1 compression fracture.  Patient denies any pain at this time. --Dr. Meriam Sprague discussed L1 compression deformity with Dr. Ernestine Mcmurray neurosurgeon who recommended TLSO brace for comfort and follow-up outpatient for upright x-rays. --Continue PT OT --Continue pain management  Acute metabolic encephalopathy likely in the setting of progressive dementia Patient recently completed a course of antibiotics for UTI on 10/21/2023 Patient lacks medical decision making capacity.  She does not understand the gravity of her medical condition, she is not able to synthesize what she is being managed for, this is impeded by her progressive dementia. --Continue Haldol PRN --Avoid benzo's --Appreciate psychiatry's input   Type 2 diabetes mellitus with chronic kidney disease, without long-term current use of insulin (HCC) Diet controlled at this time with last A1c of 7.5%.   Monitor fasting glucose for now, initiate sliding scale Novolog if indicated   Paroxysmal atrial fibrillation (HCC) - Continue home metoprolol and Eliquis   DVT (deep venous thrombosis) - recent diagnosis --Continue home Eliquis   Chronic obstructive pulmonary disease (HCC) - DuoNebs as needed   Essential hypertension - Given advanced age, discontinue home  Benzapril.  Given suspected poor p.o. intake, high risk for AKI and no benefit of ACE-i at this point.     Advance Care Planning:   Code Status: Do not attempt resuscitation (DNR) - Comfort care please see goals of care discussion as noted above    Consults: Psychiatry    Family Communication: Daughter/POA [name] updated by phone this afternoon.  No new medical updates to provide.  Pt is stable from medical standpoint.      CODE STATUS: DNR/DNI    Subjective:  Patient on bedside commode when seen this AM. She reports hard stool, difficulty passing it. No other acute complaints or events reported.    Physical Exam: General exam: awake, alert, no acute distress HEENT: resolving left periorbital ecchymosis, moist mucus membranes, hearing grossly normal  Respiratory system: CTAB, no wheezes, rales or rhonchi, normal respiratory effort. Cardiovascular system: normal S1/S2, RRR, no pedal edema.   Gastrointestinal system: soft, NT, ND, no HSM felt, +bowel sounds. Central nervous system: A&O x self. no gross focal neurologic deficits, normal speech Extremities: moves all , no edema, normal tone Skin: dry, intact, scattered bruises in various stages of healing Psychiatry: normal mood, congruent affect, judgement and insight appear normal      Disposition: Status is: Inpatient Remains inpatient appropriate because: unsafe d/c. Needs SNF placement.     Time spent: 36 minutes   Data Reviewed: I reviewed patient's CT scan of the brain, x-ray of the lumbar spine as noted above    Latest Ref Rng & Units 10/26/2023    6:31 AM 10/25/2023    4:30 AM 10/23/2023   11:37 AM  BMP  Glucose 70 - 99 mg/dL 811  159  165   BUN 8 - 23 mg/dL 23  31  20    Creatinine 0.44 - 1.00 mg/dL 3.29  5.18  8.41   Sodium 135 - 145 mmol/L 135  134  133   Potassium 3.5 - 5.1 mmol/L 3.7  3.7  4.1   Chloride 98 - 111 mmol/L 103  100  97   CO2 22 - 32 mmol/L 26  27  23    Calcium 8.9 - 10.3 mg/dL 8.8  9.0   8.9     Vitals:   10/25/23 0900 10/25/23 1535 10/25/23 1945 10/26/23 0605  BP: (!) 143/62 (!) 164/55 (!) 167/64 (!) 156/58  Pulse: (!) 59 69 68 62  Resp:  20 18 17   Temp:  98.4 F (36.9 C) 98.4 F (36.9 C) 97.9 F (36.6 C)  TempSrc:  Axillary Oral Oral  SpO2:  97% 97% 94%  Weight:      Height:          Latest Ref Rng & Units 10/26/2023    6:31 AM 10/25/2023    4:30 AM 10/23/2023   11:37 AM  CBC  WBC 4.0 - 10.5 K/uL 6.6  7.6  8.5   Hemoglobin 12.0 - 15.0 g/dL 66.0  63.0  16.0   Hematocrit 36.0 - 46.0 % 33.3  34.1  38.2   Platelets 150 - 400 K/uL 265  270  223      Author: Pennie Banter, DO 10/26/2023 12:24 PM  For on call review [email or web address].

## 2023-10-27 DIAGNOSIS — R296 Repeated falls: Secondary | ICD-10-CM | POA: Diagnosis not present

## 2023-10-27 MED ORDER — AMLODIPINE BESYLATE 5 MG PO TABS: 2.5000 mg | ORAL_TABLET | Freq: Every day | ORAL | Status: DC

## 2023-10-27 MED ORDER — APIXABAN 2.5 MG PO TABS
2.5000 mg | ORAL_TABLET | Freq: Two times a day (BID) | ORAL | Status: DC
  Administered 2023-10-27 – 2023-10-29 (×4): 2.5 mg via ORAL
  Filled 2023-10-27 (×4): qty 1

## 2023-10-27 NOTE — Progress Notes (Signed)
Progress Note   Patient: Monica [name] OZH:[id number] DOB: 1926-02-27 DOA: 10/23/2023     1 DOS: the patient was seen and examined on 10/27/2023  Brief hospital course: "Monica [name] is a 89 y.o. female with medical history significant of COPD, type 2 diabetes, restless leg, hypertension, hyperlipidemia, atrial fibrillation on Eliquis, who presents to the ED due to ground-level falls. Patient also has associated worsening mental status thought to be due to worsening dementia." See H&P for full HPI on admission & ED course.  Further hospital course and management as outlined below.  12/11 -- Medically stable, awaiting SNF placement after 3 midnights inpatient (expect d/c 12/14).     Assessment and Plan:    Frequent falls with L1 compression deformity Patient has a history of frequent falls dating the last few months, with numerous falls in the last 2 days complicated by an L1 compression fracture.  Patient denies any pain at this time. --Dr. Meriam Sprague discussed L1 compression deformity with Dr. Ernestine Mcmurray neurosurgeon who recommended TLSO brace for comfort and follow-up outpatient for upright x-rays. --Continue PT OT --Continue pain management  Acute metabolic encephalopathy likely in the setting of progressive dementia Patient recently completed a course of antibiotics for UTI on 10/21/2023 Patient lacks medical decision making capacity.  She does not understand the gravity of her medical condition, she is not able to synthesize what she is being managed for, this is impeded by her progressive dementia. --Continue Haldol PRN --Avoid benzo's --Appreciate psychiatry's input   Type 2 diabetes mellitus with chronic kidney disease, without long-term current use of insulin (HCC) Diet controlled at this time with last A1c of 7.5%.   Monitor fasting glucose for now, initiate sliding scale Novolog if indicated   Paroxysmal atrial fibrillation (HCC) - Continue home metoprolol and Eliquis    DVT (deep venous thrombosis) - recent diagnosis --Continue home Eliquis   Chronic obstructive pulmonary disease (HCC) - DuoNebs as needed   Essential hypertension - Given advanced age, discontinue home Benzapril (given suspected poor p.o. intake, high risk for AKI and no benefit of ACE-i at this point) -- BP's were elevated with systolic 160's to 190's -- Started amlodipine 5 mg daily - reduce to 2.5 mg daily due to soft diastolic BP's     Advance Care Planning:   Code Status: Do not attempt resuscitation (DNR) - Comfort care please see goals of care discussion as noted above    Consults: Psychiatry    Family Communication: Daughter/POA [name] updated by phone this afternoon.  No new medical updates to provide.  Pt is stable from medical standpoint.      CODE STATUS: DNR/DNI    Subjective:  Patient on bedside commode when seen this AM. She reports hard stool, difficulty passing it. No other acute complaints or events reported.    Physical Exam: General exam: awake, alert, no acute distress HEENT: resolving left periorbital ecchymosis, moist mucus membranes, hearing grossly normal  Respiratory system: CTAB, no wheezes, rales or rhonchi, normal respiratory effort. Cardiovascular system: normal S1/S2, RRR, no pedal edema.   Gastrointestinal system: soft, NT, ND, no HSM felt, +bowel sounds. Central nervous system: A&O x self. no gross focal neurologic deficits, normal speech Extremities: moves all , no edema, normal tone Skin: dry, intact, scattered bruises in various stages of healing Psychiatry: normal mood, congruent affect, judgement and insight appear normal      Disposition: Status is: Inpatient Remains inpatient appropriate because: unsafe d/c. Needs SNF placement.  Time spent: 36 minutes   Data Reviewed: I reviewed patient's CT scan of the brain, x-ray of the lumbar spine as noted above    Latest Ref Rng & Units 10/26/2023    6:31 AM 10/25/2023     4:30 AM 10/23/2023   11:37 AM  BMP  Glucose 70 - 99 mg/dL 161  096  045   BUN 8 - 23 mg/dL 23  31  20    Creatinine 0.44 - 1.00 mg/dL 4.09  8.11  9.14   Sodium 135 - 145 mmol/L 135  134  133   Potassium 3.5 - 5.1 mmol/L 3.7  3.7  4.1   Chloride 98 - 111 mmol/L 103  100  97   CO2 22 - 32 mmol/L 26  27  23    Calcium 8.9 - 10.3 mg/dL 8.8  9.0  8.9     Vitals:   10/27/23 0232 10/27/23 0553 10/27/23 0751 10/27/23 1123  BP: (!) 181/66 (!) 173/58 (!) 121/48 (!) 142/52  Pulse: (!) 57 (!) 54 (!) 53 67  Resp: 18 17 16    Temp: (!) 97.4 F (36.3 C) (!) 97.4 F (36.3 C)    TempSrc: Oral     SpO2: 98% 96% 98% 98%  Weight:      Height:          Latest Ref Rng & Units 10/26/2023    6:31 AM 10/25/2023    4:30 AM 10/23/2023   11:37 AM  CBC  WBC 4.0 - 10.5 K/uL 6.6  7.6  8.5   Hemoglobin 12.0 - 15.0 g/dL 78.2  95.6  21.3   Hematocrit 36.0 - 46.0 % 33.3  34.1  38.2   Platelets 150 - 400 K/uL 265  270  223      Author: Pennie Banter, DO 10/27/2023 11:38 AM  For on call review [email or web address]

## 2023-10-27 NOTE — Progress Notes (Signed)
Physical Therapy Treatment Patient Details Name: Monica [name] MRN: [id number] DOB: 1926/11/01 Today's Date: 10/27/2023   History of Present Illness Pt is 89 y/o admitted 10/18/23 for DVT and 10/23/23 for frequent falls. S/Sx associated involve weakness and LLE swelling. PmHx includes: COPD, DM, hyperlipidemia, dementia and HTN.    PT Comments  Pt was long sitting in bed upon arrival. She is alert and oriented x 2. Agrees to session and remains cooperative and pleasant throughout. Order for TLSO noted. Applied brace at EOB with education however will need further review in future sessions. Pt was able to tolerate getting OOB towards L. Stood EOB to rollator and ambulated 75 ft in hallway without LOB. Narrow BOS noted and unsteadiness observed. Pt was sitting in recliner with chair alarm in place and RN tech in room to do bathing/hygiene at conclusion of session. Pt is progressing towards rehab goals.     If plan is discharge home, recommend the following: A little help with walking and/or transfers;A lot of help with bathing/dressing/bathroom;Assistance with cooking/housework;Direct supervision/assist for medications management;Assist for transportation;Help with stairs or ramp for entrance;Supervision due to cognitive status   Can travel by private vehicle     No  Equipment Recommendations  Other (comment) (defer to next level of care)    Recommendations for Other Services       Precautions / Restrictions Precautions Precautions: Fall Required Braces or Orthoses: Spinal Brace (TLSO) Spinal Brace: Thoracolumbosacral orthotic Restrictions Weight Bearing Restrictions Per Provider Order: No     Mobility  Bed Mobility Overal bed mobility: Needs Assistance Bed Mobility: Supine to Sit  Supine to sit: Contact guard, HOB elevated, Used rails  General bed mobility comments: TLSO applied at EOB.    Transfers Overall transfer level: Needs assistance Equipment used: Rollator (4  wheels) Transfers: Sit to/from Stand Sit to Stand: Contact guard assist  General transfer comment: CGA for safety    Ambulation/Gait Ambulation/Gait assistance: Contact guard assist Gait Distance (Feet): 60 Feet Assistive device: Rollator (4 wheels) Gait Pattern/deviations: Narrow base of support Gait velocity: decreased  General Gait Details: pt was able to ambulate ~ 75 ft into hallway with rollator. No LOB but pt does endorse fatigue after ~ 50 ft   Balance Overall balance assessment: Needs assistance Sitting-balance support: Feet supported    Standing balance support: Bilateral upper extremity supported, During functional activity Standing balance-Leahy Scale: Fair       Cognition Arousal: Alert Behavior During Therapy: WFL for tasks assessed/performed Overall Cognitive Status: History of cognitive impairments - at baseline      General Comments: pt was alert and pleasant throughout however pt is confused and disoriented overall. Was able to follow commands and perform desired task throughout. No agitation observed throughout.               Pertinent Vitals/Pain Pain Assessment Pain Assessment: No/denies pain Pain Score: 0-No pain Pain Location: LLE Pain Descriptors / Indicators: Grimacing Pain Intervention(s): Limited activity within patient's tolerance, Monitored during session, Premedicated before session, Repositioned     PT Goals (current goals can now be found in the care plan section) Acute Rehab PT Goals Patient Stated Goal: none stated Progress towards PT goals: Progressing toward goals    Frequency    Min 1X/week        Co-evaluation     PT goals addressed during session: Mobility/safety with mobility;Balance;Proper use of DME;Strengthening/ROM        AM-PAC PT "6 Clicks" Mobility   Outcome Measure  Help needed turning from your back to your side while in a flat bed without using bedrails?: A Little Help needed moving from lying on your  back to sitting on the side of a flat bed without using bedrails?: A Little Help needed moving to and from a bed to a chair (including a wheelchair)?: A Little Help needed standing up from a chair using your arms (e.g., wheelchair or bedside chair)?: A Little Help needed to walk in hospital room?: A Little Help needed climbing 3-5 steps with a railing? : A Lot 6 Click Score: 17    End of Session Equipment Utilized During Treatment: Other (comment);Back brace (TLSO) Activity Tolerance: Patient limited by fatigue Patient left: in chair;with call bell/phone within reach;with chair alarm set Nurse Communication: Mobility status PT Visit Diagnosis: Unsteadiness on feet (R26.81);Other abnormalities of gait and mobility (R26.89);Difficulty in walking, not elsewhere classified (R26.2);Pain Pain - Right/Left: Right Pain - part of body: Leg     Time: 0950-1010 PT Time Calculation (min) (ACUTE ONLY): 20 min  Charges:    $Gait Training: 8-22 mins PT General Charges $$ ACUTE PT VISIT: 1 Visit                     Jetta Lout PTA 10/27/23, 12:49 PM

## 2023-10-27 NOTE — Progress Notes (Signed)
OT Cancellation Note  Patient Details Name: Monica [name] MRN: [id number] DOB: 01/29/1926   Cancelled Treatment:    Reason Eval/Treat Not Completed: Patient declined, no reason specified. Upon attempt, pt eating lunch. Daughter present. Pt declines therapy at this time, agreeable to re-attempt at later date/time.   Arman Filter., MPH, MS, OTR/L ascom [phone] 10/27/23, 2:04 PM

## 2023-10-28 DIAGNOSIS — M4856XA Collapsed vertebra, not elsewhere classified, lumbar region, initial encounter for fracture: Secondary | ICD-10-CM | POA: Diagnosis present

## 2023-10-28 DIAGNOSIS — Z751 Person awaiting admission to adequate facility elsewhere: Secondary | ICD-10-CM | POA: Diagnosis not present

## 2023-10-28 DIAGNOSIS — Z86718 Personal history of other venous thrombosis and embolism: Secondary | ICD-10-CM | POA: Diagnosis not present

## 2023-10-28 DIAGNOSIS — G2581 Restless legs syndrome: Secondary | ICD-10-CM | POA: Diagnosis present

## 2023-10-28 DIAGNOSIS — G9341 Metabolic encephalopathy: Secondary | ICD-10-CM | POA: Diagnosis present

## 2023-10-28 DIAGNOSIS — I480 Paroxysmal atrial fibrillation: Secondary | ICD-10-CM | POA: Diagnosis present

## 2023-10-28 DIAGNOSIS — Z7901 Long term (current) use of anticoagulants: Secondary | ICD-10-CM | POA: Diagnosis not present

## 2023-10-28 DIAGNOSIS — I129 Hypertensive chronic kidney disease with stage 1 through stage 4 chronic kidney disease, or unspecified chronic kidney disease: Secondary | ICD-10-CM | POA: Diagnosis present

## 2023-10-28 DIAGNOSIS — Y92099 Unspecified place in other non-institutional residence as the place of occurrence of the external cause: Secondary | ICD-10-CM | POA: Diagnosis not present

## 2023-10-28 DIAGNOSIS — Z87891 Personal history of nicotine dependence: Secondary | ICD-10-CM | POA: Diagnosis not present

## 2023-10-28 DIAGNOSIS — E785 Hyperlipidemia, unspecified: Secondary | ICD-10-CM | POA: Diagnosis present

## 2023-10-28 DIAGNOSIS — F609 Personality disorder, unspecified: Secondary | ICD-10-CM | POA: Diagnosis present

## 2023-10-28 DIAGNOSIS — N182 Chronic kidney disease, stage 2 (mild): Secondary | ICD-10-CM | POA: Diagnosis present

## 2023-10-28 DIAGNOSIS — Z66 Do not resuscitate: Secondary | ICD-10-CM | POA: Diagnosis present

## 2023-10-28 DIAGNOSIS — J449 Chronic obstructive pulmonary disease, unspecified: Secondary | ICD-10-CM | POA: Diagnosis present

## 2023-10-28 DIAGNOSIS — R296 Repeated falls: Secondary | ICD-10-CM | POA: Diagnosis present

## 2023-10-28 DIAGNOSIS — F03918 Unspecified dementia, unspecified severity, with other behavioral disturbance: Secondary | ICD-10-CM | POA: Diagnosis present

## 2023-10-28 DIAGNOSIS — Z96643 Presence of artificial hip joint, bilateral: Secondary | ICD-10-CM | POA: Diagnosis present

## 2023-10-28 DIAGNOSIS — W19XXXA Unspecified fall, initial encounter: Secondary | ICD-10-CM | POA: Diagnosis present

## 2023-10-28 DIAGNOSIS — E1122 Type 2 diabetes mellitus with diabetic chronic kidney disease: Secondary | ICD-10-CM | POA: Diagnosis present

## 2023-10-28 MED ORDER — AMLODIPINE BESYLATE 5 MG PO TABS
5.0000 mg | ORAL_TABLET | Freq: Every day | ORAL | Status: DC
  Administered 2023-10-28 – 2023-10-29 (×2): 5 mg via ORAL
  Filled 2023-10-28 (×2): qty 1

## 2023-10-28 NOTE — TOC Progression Note (Signed)
Transition of Care [facility]) - Progression Note    Patient Details  Name: Monica [name] MRN: [id number] Date of Birth: Oct 20, 1926  Transition of [facility] CM/SW Contact  Liliana Cline, LCSW Phone Number: 10/28/2023, 11:03 AM  Clinical Narrative:    Herma Carson Main # and spoke to Foster. Colin Mulders confirmed patient can come to [facility] tomorrow. Colin Mulders will be the contact person tomorrow.  Report # for tomorrow is [phone]. Called [facility] and spoke to Munden, she confirms plan for [facility] to pick up at 12:30 tomorrow.         Expected Discharge Plan and Services                                               Social Determinants of Health (SDOH) Interventions SDOH Screenings   Food Insecurity: No Food Insecurity (10/23/2023)  Housing: Unknown (10/27/2023)  Transportation Needs: No Transportation Needs (10/23/2023)  Utilities: Not At Risk (10/23/2023)  Alcohol Screen: Low Risk  (04/20/2022)  Depression (PHQ2-9): Low Risk  (12/01/2022)  Tobacco Use: Medium Risk (10/23/2023)    Readmission Risk Interventions    10/19/2023    2:47 PM 10/18/2023   11:18 AM  Readmission Risk Prevention Plan  Transportation Screening Complete Complete  PCP or Specialist Appt within 3-5 Days Complete   HRI or Home Care Consult Complete Complete  Social Work Consult for Recovery Care Planning/Counseling Complete Complete  Palliative Care Screening Complete Complete  Medication Review [employer]) Complete

## 2023-10-28 NOTE — Care Management Important Message (Signed)
Important Message  Patient Details  Name: Monica [name] MRN: [id number] Date of Birth: January 28, 1926   Important Message Given:  N/A - LOS <3 / Initial given by admissions     [name] [name] 10/28/2023, 7:30 AM

## 2023-10-28 NOTE — Progress Notes (Signed)
Progress Note   Patient: Monica [name]:[id number] DOB: 05-10-1926 DOA: 10/23/2023     2 DOS: the patient was seen and examined on 10/28/2023  Brief hospital course: "ASHONTE WALTZER is a 89 y.o. female with medical history significant of COPD, type 2 diabetes, restless leg, hypertension, hyperlipidemia, atrial fibrillation on Eliquis, who presents to the ED due to ground-level falls. Patient also has associated worsening mental status thought to be due to worsening dementia." See H&P for full HPI on admission & ED course.  Further hospital course and management as outlined below.  12/11 -- Medically stable, awaiting SNF placement after 3 midnights inpatient (expect d/c 12/14).     Assessment and Plan:    Frequent falls with L1 compression deformity Patient has a history of frequent falls dating the last few months, with numerous falls in the last 2 days complicated by an L1 compression fracture.  Patient denies any pain at this time. --Dr. Meriam Sprague discussed L1 compression deformity with Dr. Ernestine Mcmurray neurosurgeon who recommended TLSO brace for comfort and follow-up outpatient for upright x-rays. --Continue PT OT - SNF recommended --Continue pain management  Acute metabolic encephalopathy likely in the setting of progressive dementia Patient recently completed a course of antibiotics for UTI on 10/21/2023 Patient lacks medical decision making capacity.  She does not understand the gravity of her medical condition, she is not able to synthesize what she is being managed for, this is impeded by her progressive dementia. --Continue Haldol PRN --Avoid benzo's --Appreciate psychiatry's input   Type 2 diabetes mellitus with chronic kidney disease, without long-term current use of insulin (HCC) Diet controlled at this time with last A1c of 7.5%.   Monitor fasting glucose for now, initiate sliding scale Novolog if indicated   Paroxysmal atrial fibrillation (HCC) - Continue home metoprolol  and Eliquis   DVT (deep venous thrombosis) - recent diagnosis --Continue home Eliquis   Chronic obstructive pulmonary disease (HCC) - DuoNebs as needed   Essential hypertension - Given advanced age, discontinue home Benzapril (given suspected poor p.o. intake, high risk for AKI and no benefit of ACE-i at this point) -- BP's were elevated with systolic 160's to 190's -- Started amlodipine 5 mg daily - reduce to 2.5 mg daily due to soft diastolic BP's     Advance Care Planning:   Code Status: Do not attempt resuscitation (DNR) - Comfort care please see goals of care discussion as noted above    Consults: Psychiatry    Family Communication: Daughter/POA [name] updated by phone 12/11. No new medical updates to provide.  Pt is stable from medical standpoint.      CODE STATUS: DNR/DNI    Subjective:  Patient sleeping but woke easily to voice this AM.  She denies any complaints or issues. No acute events reported.  She is eager to discharge tomorrow.    Physical Exam: General exam: sleeping woke easily to voice, no acute distress HEENT: nearly resolved left periorbital ecchymosis, moist mucus membranes Respiratory system: CTAB, no wheezes, rales or rhonchi, normal respiratory effort. Cardiovascular system: normal S1/S2, RRR, no pedal edema.   Gastrointestinal system: soft, NT, ND Central nervous system: A&O x self. no gross focal neurologic deficits, normal speech Extremities: moves all , no edema Psychiatry: normal mood, congruent affect      Disposition: Status is: Inpatient Remains inpatient appropriate because: unsafe d/c. Needs SNF placement.     Time spent: 35 minutes   Data Reviewed: I reviewed patient's CT scan of the brain,  x-ray of the lumbar spine as noted above    Latest Ref Rng & Units 10/26/2023    6:31 AM 10/25/2023    4:30 AM 10/23/2023   11:37 AM  BMP  Glucose 70 - 99 mg/dL 440  102  725   BUN 8 - 23 mg/dL 23  31  20    Creatinine 0.44 - 1.00 mg/dL  3.66  4.40  3.47   Sodium 135 - 145 mmol/L 135  134  133   Potassium 3.5 - 5.1 mmol/L 3.7  3.7  4.1   Chloride 98 - 111 mmol/L 103  100  97   CO2 22 - 32 mmol/L 26  27  23    Calcium 8.9 - 10.3 mg/dL 8.8  9.0  8.9     Vitals:   10/27/23 2053 10/28/23 0603 10/28/23 0705 10/28/23 0803  BP: (!) 148/74 (!) 190/61 (!) 178/60 (!) 148/97  Pulse: 72 (!) 56 (!) 51 (!) 55  Resp: 18 18 18 16   Temp: 97.9 F (36.6 C) (!) 97.5 F (36.4 C)  97.9 F (36.6 C)  TempSrc:      SpO2: 98% 99% 96% 99%  Weight:      Height:          Latest Ref Rng & Units 10/26/2023    6:31 AM 10/25/2023    4:30 AM 10/23/2023   11:37 AM  CBC  WBC 4.0 - 10.5 K/uL 6.6  7.6  8.5   Hemoglobin 12.0 - 15.0 g/dL 42.5  95.6  38.7   Hematocrit 36.0 - 46.0 % 33.3  34.1  38.2   Platelets 150 - 400 K/uL 265  270  223      Author: Pennie Banter, DO 10/28/2023 10:57 AM  For on call review [email or web address].

## 2023-10-29 ENCOUNTER — Encounter: Payer: Self-pay | Admitting: Internal Medicine

## 2023-10-29 DIAGNOSIS — E785 Hyperlipidemia, unspecified: Secondary | ICD-10-CM | POA: Diagnosis not present

## 2023-10-29 DIAGNOSIS — Z8673 Personal history of transient ischemic attack (TIA), and cerebral infarction without residual deficits: Secondary | ICD-10-CM | POA: Diagnosis not present

## 2023-10-29 DIAGNOSIS — I480 Paroxysmal atrial fibrillation: Secondary | ICD-10-CM | POA: Diagnosis not present

## 2023-10-29 DIAGNOSIS — G9341 Metabolic encephalopathy: Secondary | ICD-10-CM | POA: Diagnosis not present

## 2023-10-29 DIAGNOSIS — F419 Anxiety disorder, unspecified: Secondary | ICD-10-CM | POA: Diagnosis not present

## 2023-10-29 DIAGNOSIS — K5909 Other constipation: Secondary | ICD-10-CM | POA: Diagnosis not present

## 2023-10-29 DIAGNOSIS — I10 Essential (primary) hypertension: Secondary | ICD-10-CM | POA: Diagnosis not present

## 2023-10-29 DIAGNOSIS — E1165 Type 2 diabetes mellitus with hyperglycemia: Secondary | ICD-10-CM | POA: Diagnosis not present

## 2023-10-29 DIAGNOSIS — S32010D Wedge compression fracture of first lumbar vertebra, subsequent encounter for fracture with routine healing: Secondary | ICD-10-CM | POA: Diagnosis not present

## 2023-10-29 DIAGNOSIS — G2581 Restless legs syndrome: Secondary | ICD-10-CM | POA: Diagnosis not present

## 2023-10-29 DIAGNOSIS — R296 Repeated falls: Secondary | ICD-10-CM | POA: Diagnosis not present

## 2023-10-29 LAB — GLUCOSE, CAPILLARY: Glucose-Capillary: 182 mg/dL — ABNORMAL HIGH (ref 70–99)

## 2023-10-29 MED ORDER — SENNOSIDES-DOCUSATE SODIUM 8.6-50 MG PO TABS: 1.0000 | ORAL_TABLET | Freq: Two times a day (BID) | ORAL | Status: AC

## 2023-10-29 MED ORDER — LORAZEPAM 0.5 MG PO TABS: 0.5000 mg | ORAL_TABLET | Freq: Three times a day (TID) | ORAL | 0 refills | Status: AC | PRN

## 2023-10-29 MED ORDER — SIMETHICONE 80 MG PO CHEW: 80.0000 mg | CHEWABLE_TABLET | Freq: Four times a day (QID) | ORAL | Status: AC | PRN

## 2023-10-29 MED ORDER — BISACODYL 10 MG RE SUPP: 10.0000 mg | Freq: Every day | RECTAL | Status: AC | PRN

## 2023-10-29 MED ORDER — POLYETHYLENE GLYCOL 3350 17 G PO PACK: 17.0000 g | PACK | Freq: Every day | ORAL | Status: AC | PRN

## 2023-10-29 MED ORDER — TRAMADOL HCL 50 MG PO TABS: 50.0000 mg | ORAL_TABLET | Freq: Four times a day (QID) | ORAL | 0 refills | Status: AC | PRN

## 2023-10-29 MED ORDER — AMLODIPINE BESYLATE 5 MG PO TABS: 5.0000 mg | ORAL_TABLET | Freq: Every day | ORAL | Status: AC

## 2023-10-29 MED ORDER — MAGNESIUM HYDROXIDE 400 MG/5ML PO SUSP: 15.0000 mL | Freq: Every day | ORAL | Status: AC | PRN

## 2023-10-29 NOTE — Discharge Summary (Addendum)
Physician Discharge Summary   Patient: Monica [name] MRN: [id number] DOB: 1926/08/31  Admit date:     10/23/2023  Discharge date: 10/29/23  Discharge Physician: Pennie Banter   PCP: Pcp, No   Recommendations at discharge:   Follow up with Primary Care in 1-2 weeks Repeat CBC, BMP at follow up Follow up on blood pressure control [facility] to continue to follow patient at SNF Follow up with Neurosurgery for L1 compression fracture for upright x-rays  Discharge Diagnoses: Principal Problem:   Frequent falls Active Problems:   Altered mental status   Goals of care, counseling/discussion   Type 2 diabetes mellitus with chronic kidney disease, without long-term current use of insulin (HCC)   Essential hypertension   Chronic obstructive pulmonary disease (HCC)   DVT (deep venous thrombosis) (HCC)   Paroxysmal atrial fibrillation (HCC)  Resolved Problems:   Agitation  Hospital Course:  "Monica [name] is a 89 y.o. female with medical history significant of COPD, type 2 diabetes, restless leg, hypertension, hyperlipidemia, atrial fibrillation on Eliquis, who presents to the ED due to ground-level falls. Patient also has associated worsening mental status thought to be due to worsening dementia." See H&P for full HPI on admission & ED course.   Further hospital course and management as outlined below.   12/11 -- Medically stable, awaiting SNF placement after 3 midnights as inpatient   12/14 -- pt has remained medically stable during the interim.  No acute issues or complaints.  Pt is medically stable for discharge to SNF today.   Assessment and Plan:  Frequent falls  L1 compression deformity Patient has a history of frequent falls dating the last few months, with numerous falls in the last 2 days complicated by an L1 compression fracture.  Patient denies any pain, requires tramadol occasionally. L1 compression deformity was discussed with Dr. Ernestine Mcmurray  neurosurgeon, recommended TLSO brace for comfort and follow-up outpatient for upright x-rays. --Continue PT OT at SNF --Continue pain management as needed --Follow up with Neurosurgery outpatient   Acute metabolic encephalopathy  Baseline progressive dementia Patient recently completed a course of antibiotics for UTI on 10/21/2023 Patient lacks medical decision making capacity due to progression of dementia.   Decisions should be made by patients healthcare POA/s. --Continue Haldol PRN agitation --Low dose Ativan PRN anxiety --Appreciate psychiatry's input --Patient is followed by [facility], contracting for palliative care   Essential hypertension - Given advanced age, discontinued benzapril (given suspected poor p.o. intake, high risk for AKI's) -- Continue home metoprolol -- BP's were elevated with systolic 160's to 190's -- Started amlodipine 5 mg daily -- Monitor BP's and follow up with PCP  Type 2 diabetes mellitus with chronic kidney disease, without long-term current use of insulin  Diet controlled with last A1c of 7.5%.   --Not on medications --Monitor in follow up   Paroxysmal atrial fibrillation (HCC) - Continue metoprolol and Eliquis   DVT (deep venous thrombosis) - recent diagnosis --Continue Eliquis   Chronic obstructive pulmonary disease (HCC) - DuoNebs as needed     Consultants: Psychiatry Procedures performed: None  Disposition: Skilled nursing facility Diet recommendation:  Discharge Diet Orders (From admission, onward)     Start     Ordered   10/29/23 0000  Diet - low sodium heart healthy        10/29/23 0757            DISCHARGE MEDICATION: Allergies as of 10/29/2023       Reactions  Acetaminophen Anaphylaxis, Itching   Sulfa Antibiotics Swelling   Celecoxib Nausea And Vomiting   Codeine Rash   Lyrica [pregabalin] Rash   Penicillin G Rash   Has patient had a PCN reaction causing immediate rash, facial/tongue/throat swelling, SOB or  lightheadedness with hypotension: Yes Has patient had a PCN reaction causing severe rash involving mucus membranes or skin necrosis: No Has patient had a PCN reaction that required hospitalization: No Has patient had a PCN reaction occurring within the last 10 years: Unknown If all of the above answers are "NO", then may proceed with Cephalosporin use.   Petrolatum-zinc Oxide Rash        Medication List     STOP taking these medications    benazepril 20 MG tablet Commonly known as: LOTENSIN   morphine 20 MG/5ML solution       TAKE these medications    albuterol 108 (90 Base) MCG/ACT inhaler Commonly known as: VENTOLIN HFA INHALE TWO PUFFS INTO THE LUNGS EVERY 4 HOURS AS NEEDED FOR WHEEZING OR SHORTNESS OF BREATH   amLODipine 5 MG tablet Commonly known as: NORVASC Take 1 tablet (5 mg total) by mouth daily.   antiseptic oral rinse Liqd 15 mLs by Mouth Rinse route as needed for dry mouth.   apixaban 5 MG Tabs tablet Commonly known as: ELIQUIS Take 2 tablets (10 mg total) by mouth 2 (two) times daily for 5 days, THEN 1 tablet (5 mg total) 2 (two) times daily for 25 days. Start taking on: October 21, 2023   bisacodyl 10 MG suppository Commonly known as: DULCOLAX Place 1 suppository (10 mg total) rectally daily as needed for severe constipation.   haloperidol 1 MG tablet Commonly known as: HALDOL Take 1 tablet (1 mg total) by mouth every 8 (eight) hours as needed for agitation.   LORazepam 0.5 MG tablet Commonly known as: Ativan Take 1 tablet (0.5 mg total) by mouth every 8 (eight) hours as needed for anxiety.   lubiprostone 8 MCG capsule Commonly known as: AMITIZA Take 1 capsule (8 mcg total) by mouth 2 (two) times daily with a meal.   magnesium hydroxide 400 MG/5ML suspension Commonly known as: MILK OF MAGNESIA Take 15 mLs by mouth daily as needed for moderate constipation.   metoprolol succinate 25 MG 24 hr tablet Commonly known as: TOPROL-XL Take 25 mg by  mouth daily.   nystatin powder Generic drug: nystatin APPLY 1 APPLICATION TOPICALLY 3 TIMES DAILY TO GROIN AREA UNTIL RASH RESOLVES   ondansetron 4 MG disintegrating tablet Commonly known as: ZOFRAN-ODT Take 1 tablet (4 mg total) by mouth every 6 (six) hours as needed for nausea.   polyethylene glycol 17 g packet Commonly known as: MIRALAX / GLYCOLAX Take 17 g by mouth daily as needed for mild constipation.   polyvinyl alcohol 1.4 % ophthalmic solution Commonly known as: LIQUIFILM TEARS Place 1 drop into both eyes 4 (four) times daily as needed for dry eyes.   senna-docusate 8.6-50 MG tablet Commonly known as: Senokot-S Take 1 tablet by mouth 2 (two) times daily. Hold if having loose or frequent stools   simethicone 80 MG chewable tablet Commonly known as: MYLICON Chew 1 tablet (80 mg total) by mouth 4 (four) times daily as needed for flatulence (crampy abdominal pain).   traMADol 50 MG tablet Commonly known as: ULTRAM Take 1 tablet (50 mg total) by mouth every 6 (six) hours as needed (mild to moderate pain).  Discharge Care Instructions  (From admission, onward)           Start     Ordered   10/29/23 0000  Discharge wound care:       Comments: Off-load pressure areas with frequent repositioning and elevation of heels when in bed. Recommend foam dressings. Closely monitor areas for any signs of infection.   10/29/23 0757            Contact information for after-discharge care     Destination     [facility] .   Service: Skilled Nursing Contact information: [address] [place] [address] [phone]                    Discharge Exam: Ceasar Mons Weights   10/23/23 0934 10/26/23 2229  Weight: 56.7 kg 55.2 kg   General exam: awake, alert, no acute distress HEENT: nearly resolved periorbital ecchymosis, moist mucus membranes, hearing grossly normal  Respiratory system: CTAB, no wheezes, rales or  rhonchi, normal respiratory effort. Cardiovascular system: normal S1/S2, RRR, no pedal edema.   Gastrointestinal system: soft, NT, ND, no HSM felt, +bowel sounds. Central nervous system: A&O x 2. no gross focal neurologic deficits, normal speech Extremities: moves all, no edema, normal tone Skin: dry, intact, normal temperature Psychiatry: normal mood, congruent affect, judgement and insight appear normal   Condition at discharge: stable  The results of significant diagnostics from this hospitalization (including imaging, microbiology, ancillary and laboratory) are listed below for reference.   Imaging Studies: DG Thoracic Spine 2 View Result Date: 10/23/2023 CLINICAL DATA:  Larey Seat EXAM: THORACIC SPINE 2 VIEWS COMPARISON:  None Available. FINDINGS: Frontal and lateral views of the thoracic spine are obtained. Anterior wedge compression deformity is seen at the L1 vertebral body. No evidence of acute thoracic spine fractures. Mild diffuse thoracic spondylosis. Paraspinal soft tissues are unremarkable. Lungs are clear. IMPRESSION: 1. No acute thoracic spine fracture. 2. Anterior wedge compression deformity of the L1 vertebral body. Please see separately reported lumbar spine exam. Electronically Signed   By: Sharlet Salina M.D.   On: 10/23/2023 11:29   DG Lumbar Spine 2-3 Views Result Date: 10/23/2023 CLINICAL DATA:  Larey Seat EXAM: LUMBAR SPINE - 2-3 VIEW COMPARISON:  05/31/2019 FINDINGS: Frontal and lateral views of the lumbar spine are obtained. There are 6 non-rib-bearing lumbar type vertebral bodies. Interval anterior wedge compression deformity of the L1 vertebral body, with possible fracture line through the anterior superior endplate compatible with an acute to subacute compression fracture. No evidence of retropulsion. No other acute bony abnormalities. Stable multilevel spondylosis and facet hypertrophy greatest in the lower lumbar spine. Stable grade 1 anterolisthesis of L5 on L6. IMPRESSION: 1.  Interval anterior wedge compression deformity of the L1 vertebral body, with less than 25% loss of height, likely acute. No retropulsion. 2. Stable multilevel lumbar degenerative changes. Electronically Signed   By: Sharlet Salina M.D.   On: 10/23/2023 11:29   DG Pelvis Portable Result Date: 10/23/2023 CLINICAL DATA:  Larey Seat EXAM: PORTABLE PELVIS 1-2 VIEWS COMPARISON:  10/22/2023 FINDINGS: Single frontal view the pelvis was obtained. Stable bilateral hip arthroplasties, with distal margins of the femoral component of the prosthesis excluded by collimation. No acute displaced fracture. Sacroiliac joints are unremarkable. IMPRESSION: 1. Stable pelvis, no acute process. Electronically Signed   By: Sharlet Salina M.D.   On: 10/23/2023 11:25   DG Ribs Unilateral W/Chest Right Result Date: 10/23/2023 CLINICAL DATA:  Un witnessed fall EXAM: RIGHT RIBS AND  CHEST - 3+ VIEW COMPARISON:  08/18/2023 FINDINGS: Frontal view of the chest as well as frontal and oblique views of the right thoracic cage are obtained. Cardiac silhouette is unremarkable. No acute airspace disease, effusion, or pneumothorax. Prior healed right lateral sixth and seventh rib fractures. No acute bony abnormalities. IMPRESSION: 1. No acute displaced rib fracture. 2. No acute intrathoracic process. Electronically Signed   By: Sharlet Salina M.D.   On: 10/23/2023 11:24   CT Cervical Spine Wo Contrast Result Date: 10/23/2023 CLINICAL DATA:  Neck trauma (Age >= 65y); Head trauma, minor (Age >= 65y) EXAM: CT HEAD WITHOUT CONTRAST CT CERVICAL SPINE WITHOUT CONTRAST TECHNIQUE: Multidetector CT imaging of the head and cervical spine was performed following the standard protocol without intravenous contrast. Multiplanar CT image reconstructions of the cervical spine were also generated. RADIATION DOSE REDUCTION: This exam was performed according to the departmental dose-optimization program which includes automated exposure control, adjustment of the mA  and/or kV according to patient size and/or use of iterative reconstruction technique. COMPARISON:  CT head and C-spine 09/06/2023 FINDINGS: CT HEAD FINDINGS Brain: Cerebral ventricle sizes are concordant with the degree of cerebral volume loss. Patchy and confluent areas of decreased attenuation are noted throughout the deep and periventricular white matter of the cerebral hemispheres bilaterally, compatible with chronic microvascular ischemic disease. no acute intracranial abnormality. No evidence of large-territorial acute infarction. No parenchymal hemorrhage. No mass lesion. No extra-axial collection. No mass effect or midline shift. No hydrocephalus. Basilar cisterns are patent. Vascular: No hyperdense vessel. Atherosclerotic calcifications are present within the cavernous internal carotid arteries. Skull: No acute fracture or focal lesion. Sinuses/Orbits: Paranasal sinuses and mastoid air cells are clear. Bilateral lens replacement. Otherwise the orbits are unremarkable. Other: None. CT CERVICAL SPINE FINDINGS Alignment: Normal. Skull base and vertebrae: Multilevel moderate severe degenerative changes spine with posterior disc osteophyte complex formation. Severe osseous neural foraminal stenosis at the left C2-C3 and C3-C4 levels. At least moderate central canal stenosis at the C6-C7 level. No acute fracture. No aggressive appearing focal osseous lesion or focal pathologic process. Soft tissues and spinal canal: No prevertebral fluid or swelling. No visible canal hematoma. Upper chest: Unremarkable. Other: Atherosclerotic plaque. IMPRESSION: 1.  No acute intracranial abnormality. 2. No acute displaced fracture or traumatic listhesis of the cervical spine. Electronically Signed   By: Tish Frederickson M.D.   On: 10/23/2023 09:56   CT Head Wo Contrast Result Date: 10/23/2023 CLINICAL DATA:  Neck trauma (Age >= 65y); Head trauma, minor (Age >= 65y) EXAM: CT HEAD WITHOUT CONTRAST CT CERVICAL SPINE WITHOUT  CONTRAST TECHNIQUE: Multidetector CT imaging of the head and cervical spine was performed following the standard protocol without intravenous contrast. Multiplanar CT image reconstructions of the cervical spine were also generated. RADIATION DOSE REDUCTION: This exam was performed according to the departmental dose-optimization program which includes automated exposure control, adjustment of the mA and/or kV according to patient size and/or use of iterative reconstruction technique. COMPARISON:  CT head and C-spine 09/06/2023 FINDINGS: CT HEAD FINDINGS Brain: Cerebral ventricle sizes are concordant with the degree of cerebral volume loss. Patchy and confluent areas of decreased attenuation are noted throughout the deep and periventricular white matter of the cerebral hemispheres bilaterally, compatible with chronic microvascular ischemic disease. no acute intracranial abnormality. No evidence of large-territorial acute infarction. No parenchymal hemorrhage. No mass lesion. No extra-axial collection. No mass effect or midline shift. No hydrocephalus. Basilar cisterns are patent. Vascular: No hyperdense vessel. Atherosclerotic calcifications are present within the cavernous internal  carotid arteries. Skull: No acute fracture or focal lesion. Sinuses/Orbits: Paranasal sinuses and mastoid air cells are clear. Bilateral lens replacement. Otherwise the orbits are unremarkable. Other: None. CT CERVICAL SPINE FINDINGS Alignment: Normal. Skull base and vertebrae: Multilevel moderate severe degenerative changes spine with posterior disc osteophyte complex formation. Severe osseous neural foraminal stenosis at the left C2-C3 and C3-C4 levels. At least moderate central canal stenosis at the C6-C7 level. No acute fracture. No aggressive appearing focal osseous lesion or focal pathologic process. Soft tissues and spinal canal: No prevertebral fluid or swelling. No visible canal hematoma. Upper chest: Unremarkable. Other:  Atherosclerotic plaque. IMPRESSION: 1.  No acute intracranial abnormality. 2. No acute displaced fracture or traumatic listhesis of the cervical spine. Electronically Signed   By: Tish Frederickson M.D.   On: 10/23/2023 09:56   DG HIPS BILAT WITH PELVIS MIN 5 VIEWS Result Date: 10/22/2023 CLINICAL DATA:  Status post fall. EXAM: DG HIP (WITH OR WITHOUT PELVIS) 5+V BILAT COMPARISON:  12/09/2022 FINDINGS: The patient is status post bilateral hip arthroplasty. No signs of acute fracture or dislocation. The pelvis appears intact without signs of diastasis. Bones appear osteopenic. Heterotopic ossification identified within the medial soft tissues of the left proximal thigh. Vascular calcifications noted. IMPRESSION: 1. No acute findings. 2. Status post bilateral hip arthroplasty. Electronically Signed   By: Signa Kell M.D.   On: 10/22/2023 12:57   CT Head Wo Contrast Result Date: 10/22/2023 CLINICAL DATA:  Status post fall.  Head trauma. EXAM: CT HEAD WITHOUT CONTRAST TECHNIQUE: Contiguous axial images were obtained from the base of the skull through the vertex without intravenous contrast. RADIATION DOSE REDUCTION: This exam was performed according to the departmental dose-optimization program which includes automated exposure control, adjustment of the mA and/or kV according to patient size and/or use of iterative reconstruction technique. COMPARISON:  09/06/2023 FINDINGS: Brain: There is no evidence for acute hemorrhage, hydrocephalus, mass lesion, or abnormal extra-axial fluid collection. No definite CT evidence for acute infarction. Diffuse loss of parenchymal volume is consistent with atrophy. Patchy low attenuation in the deep hemispheric and periventricular white matter is nonspecific, Vascular: No hyperdense vessel or unexpected calcification. Skull: No evidence for fracture. No worrisome lytic or sclerotic lesion. Sinuses/Orbits: The visualized paranasal sinuses and mastoid air cells are clear.  Visualized portions of the globes and intraorbital fat are unremarkable. Other: None. IMPRESSION: 1. No acute intracranial abnormality. 2. Atrophy with chronic small vessel ischemic disease. Electronically Signed   By: Kennith Center M.D.   On: 10/22/2023 12:32   US Venous Img Lower  Left (DVT Study) Result Date: 10/18/2023 CLINICAL DATA:  Left lower extremity edema and erythema. History prior left lower extremity DVT. EXAM: LEFT LOWER EXTREMITY VENOUS DOPPLER ULTRASOUND TECHNIQUE: Gray-scale sonography with graded compression, as well as color Doppler and duplex ultrasound were performed to evaluate the lower extremity deep venous systems from the level of the common femoral vein and including the common femoral, femoral, profunda femoral, popliteal and calf veins including the posterior tibial, peroneal and gastrocnemius veins when visible. The superficial great saphenous vein was also interrogated. Spectral Doppler was utilized to evaluate flow at rest and with distal augmentation maneuvers in the common femoral, femoral and popliteal veins. COMPARISON:  11/13/2018 FINDINGS: Contralateral Common Femoral Vein: Respiratory phasicity is normal and symmetric with the symptomatic side. No evidence of thrombus. Normal compressibility. Common Femoral Vein: Stable nonocclusive echogenic mural thrombus and web formation related to prior thrombus. Good flow is present. Saphenofemoral Junction: No evidence of thrombus.  Normal compressibility and flow on color Doppler imaging. Profunda Femoral Vein: No evidence of thrombus. Normal compressibility and flow on color Doppler imaging. Femoral Vein: Stable echogenic nonocclusive mural thrombus consistent with chronic thrombus. Good flow is present in the patent central lumen. Popliteal Vein: No evidence of thrombus. Normal compressibility, respiratory phasicity and response to augmentation. Calf Veins: Probable nonocclusive thrombus in the visualized posterior tibial vein and  potentially occlusive thrombus in the visualized peroneal vein which appear new since 2019 and may be consistent with acute calf vein thrombus. Superficial Great Saphenous Vein: No evidence of thrombus. Normal compressibility. Venous Reflux:  None. Other Findings: No evidence of superficial thrombophlebitis or abnormal fluid collection. IMPRESSION: 1. Probable acute calf vein thrombus with nonocclusive thrombus in the left posterior tibial vein and potentially occlusive thrombus in the visualized left peroneal vein. 2. Stable chronic nonocclusive mural thrombus in the left common femoral and femoral veins. Electronically Signed   By: Irish Lack M.D.   On: 10/18/2023 08:14    Microbiology: Results for orders placed or performed during the hospital encounter of 10/18/23  Urine Culture (for pregnant, neutropenic or urologic patients or patients with an indwelling urinary catheter)     Status: Abnormal   Collection Time: 10/18/23  7:20 AM   Specimen: Urine, Clean Catch  Result Value Ref Range Status   Specimen Description   Final    URINE, CLEAN CATCH Performed at [facility], [address] [place], [place] [address]    Special Requests   Final    NONE Performed at [facility], [address] [place], [place] [address]    Culture (A)  Final    30,000 COLONIES/mL ENTEROCOCCUS FAECALIS 30,000 COLONIES/mL ESCHERICHIA COLI 70,000 COLONIES/mL PSEUDOMONAS AERUGINOSA    Report Status 10/21/2023 FINAL  Final   Organism ID, Bacteria ESCHERICHIA COLI (A)  Final   Organism ID, Bacteria PSEUDOMONAS AERUGINOSA (A)  Final   Organism ID, Bacteria ENTEROCOCCUS FAECALIS (A)  Final      Susceptibility   Escherichia coli - MIC*    AMPICILLIN >=32 RESISTANT Resistant     CEFAZOLIN 8 SENSITIVE Sensitive     CEFEPIME <=0.12 SENSITIVE Sensitive     CEFTRIAXONE <=0.25 SENSITIVE Sensitive     CIPROFLOXACIN >=4 RESISTANT Resistant     GENTAMICIN <=1 SENSITIVE Sensitive     IMIPENEM  <=0.25 SENSITIVE Sensitive     NITROFURANTOIN <=16 SENSITIVE Sensitive     TRIMETH/SULFA >=320 RESISTANT Resistant     AMPICILLIN/SULBACTAM >=32 RESISTANT Resistant     PIP/TAZO 64 INTERMEDIATE Intermediate ug/mL    * 30,000 COLONIES/mL ESCHERICHIA COLI   Enterococcus faecalis - MIC*    AMPICILLIN <=2 SENSITIVE Sensitive     NITROFURANTOIN <=16 SENSITIVE Sensitive     VANCOMYCIN 1 SENSITIVE Sensitive     * 30,000 COLONIES/mL ENTEROCOCCUS FAECALIS   Pseudomonas aeruginosa - MIC*    CEFTAZIDIME 2 SENSITIVE Sensitive     CIPROFLOXACIN <=0.25 SENSITIVE Sensitive     GENTAMICIN <=1 SENSITIVE Sensitive     IMIPENEM 2 SENSITIVE Sensitive     PIP/TAZO <=4 SENSITIVE Sensitive ug/mL    CEFEPIME 2 SENSITIVE Sensitive     * 70,000 COLONIES/mL PSEUDOMONAS AERUGINOSA    Labs: CBC: Recent Labs  Lab 10/23/23 1137 10/25/23 0430 10/26/23 0631  WBC 8.5 7.6 6.6  NEUTROABS 7.2 4.6 3.9  HGB 12.3 11.0* 10.7*  HCT 38.2 34.1* 33.3*  MCV 80.1 81.0 81.6  PLT 223 270 265   Basic Metabolic Panel: Recent Labs  Lab 10/23/23 1137 10/25/23 0430 10/26/23 0631  NA 133* 134* 135  K 4.1 3.7 3.7  CL 97* 100 103  CO2 23 27 26   GLUCOSE 165* 159* 160*  BUN 20 31* 23  CREATININE 0.72 0.78 0.69  CALCIUM 8.9 9.0 8.8*   Liver Function Tests: No results for input(s): "AST", "ALT", "ALKPHOS", "BILITOT", "PROT", "ALBUMIN" in the last 168 hours. CBG: Recent Labs  Lab 10/24/23 2002 10/25/23 0821 10/29/23 0757  GLUCAP 252* 171* 182*    Discharge time spent: greater than 30 minutes.  Signed: Pennie Banter, DO [facility] 10/29/2023

## 2023-10-29 NOTE — Progress Notes (Signed)
Pt report called to [facility] , and given to [employer]

## 2023-10-29 NOTE — TOC Transition Note (Signed)
Transition of Care [facility]) - Discharge Note   Patient Details  Name: Monica [name] MRN: [id number] Date of Birth: 1925/12/29  Transition of Care [facility]) CM/SW Contact:  Rodney Langton, RN Phone Number: 10/29/2023, 9:20 AM   Clinical Narrative:     Patient with discharge orders back to [facility], transport has been set up with [facility] for 1230pm today.  Called [facility], spoke with Eileen Stanford to confirm they will receive patient today, called daughter Darl Pikes, message left regarding discharge process.  Bedside nurse updated, advised to call report to [facility].   Final next level of care: Skilled Nursing Facility Barriers to Discharge: Barriers Resolved   Patient Goals and CMS Choice Patient states their goals for this hospitalization and ongoing recovery are:: SNF          Discharge Placement              Patient chooses bed at: [facility] Patient to be transferred to facility by: [facility] Name of family member notified: Darl Pikes, daughter Patient and family notified of of transfer: 10/29/23  Discharge Plan and Services Additional resources added to the After Visit Summary for                                       Social Drivers of Health (SDOH) Interventions SDOH Screenings   Food Insecurity: No Food Insecurity (10/23/2023)  Housing: Unknown (10/27/2023)  Transportation Needs: No Transportation Needs (10/23/2023)  Utilities: Not At Risk (10/23/2023)  Alcohol Screen: Low Risk  (04/20/2022)  Depression (PHQ2-9): Low Risk  (12/01/2022)  Tobacco Use: Medium Risk (10/23/2023)     Readmission Risk Interventions    10/19/2023    2:47 PM 10/18/2023   11:18 AM  Readmission Risk Prevention Plan  Transportation Screening Complete Complete  PCP or Specialist Appt within 3-5 Days Complete   HRI or Home Care Consult Complete Complete  Social Work Consult for Recovery Care Planning/Counseling Complete Complete  Palliative Care Screening Complete Complete  Medication  Review Oceanographer) Complete

## 2023-10-29 NOTE — Plan of Care (Signed)
  Problem: Clinical Measurements: Goal: Ability to maintain clinical measurements within normal limits will improve Outcome: Progressing Goal: Will remain free from infection Outcome: Progressing Goal: Diagnostic test results will improve Outcome: Progressing Goal: Respiratory complications will improve Outcome: Progressing Goal: Cardiovascular complication will be avoided Outcome: Progressing   Problem: Activity: Goal: Risk for activity intolerance will decrease Outcome: Progressing   Problem: Coping: Goal: Level of anxiety will decrease Outcome: Progressing   Problem: Elimination: Goal: Will not experience complications related to bowel motility Outcome: Progressing Goal: Will not experience complications related to urinary retention Outcome: Progressing   Problem: Pain Management: Goal: General experience of comfort will improve Outcome: Progressing   Problem: Safety: Goal: Ability to remain free from injury will improve Outcome: Progressing   Problem: Skin Integrity: Goal: Risk for impaired skin integrity will decrease Outcome: Progressing

## 2023-11-01 DIAGNOSIS — I10 Essential (primary) hypertension: Secondary | ICD-10-CM | POA: Diagnosis not present

## 2023-11-01 DIAGNOSIS — R296 Repeated falls: Secondary | ICD-10-CM | POA: Diagnosis not present

## 2023-11-01 DIAGNOSIS — Z515 Encounter for palliative care: Secondary | ICD-10-CM | POA: Diagnosis not present

## 2023-11-01 DIAGNOSIS — I480 Paroxysmal atrial fibrillation: Secondary | ICD-10-CM | POA: Diagnosis not present

## 2023-11-01 DIAGNOSIS — K5900 Constipation, unspecified: Secondary | ICD-10-CM | POA: Diagnosis not present

## 2023-11-01 DIAGNOSIS — G9341 Metabolic encephalopathy: Secondary | ICD-10-CM | POA: Diagnosis not present

## 2023-11-01 DIAGNOSIS — E785 Hyperlipidemia, unspecified: Secondary | ICD-10-CM | POA: Diagnosis not present

## 2023-11-01 DIAGNOSIS — S32010D Wedge compression fracture of first lumbar vertebra, subsequent encounter for fracture with routine healing: Secondary | ICD-10-CM | POA: Diagnosis not present

## 2023-11-01 DIAGNOSIS — N1832 Chronic kidney disease, stage 3b: Secondary | ICD-10-CM | POA: Diagnosis not present

## 2023-11-01 DIAGNOSIS — G8929 Other chronic pain: Secondary | ICD-10-CM | POA: Diagnosis not present

## 2023-11-01 DIAGNOSIS — M6281 Muscle weakness (generalized): Secondary | ICD-10-CM | POA: Diagnosis not present

## 2023-11-01 DIAGNOSIS — I82402 Acute embolism and thrombosis of unspecified deep veins of left lower extremity: Secondary | ICD-10-CM | POA: Diagnosis not present

## 2023-11-01 DIAGNOSIS — S32010A Wedge compression fracture of first lumbar vertebra, initial encounter for closed fracture: Secondary | ICD-10-CM | POA: Diagnosis not present

## 2023-11-01 DIAGNOSIS — F03911 Unspecified dementia, unspecified severity, with agitation: Secondary | ICD-10-CM | POA: Diagnosis not present

## 2023-11-01 DIAGNOSIS — E1165 Type 2 diabetes mellitus with hyperglycemia: Secondary | ICD-10-CM | POA: Diagnosis not present

## 2023-11-01 DIAGNOSIS — J449 Chronic obstructive pulmonary disease, unspecified: Secondary | ICD-10-CM | POA: Diagnosis not present

## 2023-11-01 DIAGNOSIS — E1122 Type 2 diabetes mellitus with diabetic chronic kidney disease: Secondary | ICD-10-CM | POA: Diagnosis not present

## 2023-11-02 DIAGNOSIS — G8929 Other chronic pain: Secondary | ICD-10-CM | POA: Diagnosis not present

## 2023-11-02 DIAGNOSIS — N1832 Chronic kidney disease, stage 3b: Secondary | ICD-10-CM | POA: Diagnosis not present

## 2023-11-02 DIAGNOSIS — I82402 Acute embolism and thrombosis of unspecified deep veins of left lower extremity: Secondary | ICD-10-CM | POA: Diagnosis not present

## 2023-11-02 DIAGNOSIS — S32010A Wedge compression fracture of first lumbar vertebra, initial encounter for closed fracture: Secondary | ICD-10-CM | POA: Diagnosis not present

## 2023-11-02 DIAGNOSIS — J449 Chronic obstructive pulmonary disease, unspecified: Secondary | ICD-10-CM | POA: Diagnosis not present

## 2023-11-02 DIAGNOSIS — I10 Essential (primary) hypertension: Secondary | ICD-10-CM | POA: Diagnosis not present

## 2023-11-02 DIAGNOSIS — F03911 Unspecified dementia, unspecified severity, with agitation: Secondary | ICD-10-CM | POA: Diagnosis not present

## 2023-11-02 DIAGNOSIS — K5900 Constipation, unspecified: Secondary | ICD-10-CM | POA: Diagnosis not present

## 2023-11-02 NOTE — Progress Notes (Deleted)
Referring Physician:  No referring provider defined for this encounter.  Primary Physician:  Pcp, No  History of Present Illness: 11/02/2023 [name] is here today with a chief complaint of ***  Patient reports that she slipped out of her recliner and fell striking her face as well as her pelvis  compression deformity of the L1  TLSO wearing brace?    Past Surgery: none  Monica [name] has ***no symptoms of cervical myelopathy.  The symptoms are causing a significant impact on the patient's life.   I have utilized the care everywhere function in epic to review the outside records available from external health systems.  Review of Systems:  A 10 point review of systems is negative, except for the pertinent positives and negatives detailed in the HPI.  Past Medical History: Past Medical History:  Diagnosis Date   Agitation 10/23/2023   Atrial fibrillation (HCC)    Bronchitis    Cancer (HCC)    Left leg growth, kidneys, lungs and breasts   Carcinoma of unknown primary (HCC)    COPD (chronic obstructive pulmonary disease) (HCC)    Diabetes mellitus without complication (HCC)    Hyperlipidemia    Hypertension    Pancreatitis    Pneumonia    Stroke (HCC)    TIA's   Vitamin B12 deficiency     Past Surgical History: Past Surgical History:  Procedure Laterality Date   ABDOMINAL HYSTERECTOMY  1975   BLADDER SUSPENSION  1989   CAROTID ANGIOGRAPHY Right 11/23/2019   Procedure: CAROTID ANGIOGRAPHY;  Surgeon: Renford Dills, MD;  Location: [facility] INVASIVE CV LAB;  Service: Cardiovascular;  Laterality: Right;   COLONOSCOPY     COLONOSCOPY WITH PROPOFOL N/A 01/25/2018   Procedure: COLONOSCOPY WITH PROPOFOL;  Surgeon: Midge Minium, MD;  Location: [facility] ENDOSCOPY;  Service: Endoscopy;  Laterality: N/A;   ESOPHAGOGASTRODUODENOSCOPY (EGD) WITH PROPOFOL N/A 01/24/2018   Procedure: ESOPHAGOGASTRODUODENOSCOPY (EGD) WITH PROPOFOL;  Surgeon: Midge Minium, MD;  Location: [facility]  ENDOSCOPY;  Service: Endoscopy;  Laterality: N/A;   HIP ARTHROPLASTY Left 06/03/2019   Procedure: ARTHROPLASTY BIPOLAR HIP (HEMIARTHROPLASTY);  Surgeon: Donato Heinz, MD;  Location: [facility] ORS;  Service: Orthopedics;  Laterality: Left;   HIP ARTHROPLASTY Right 04/21/2020   Procedure: ARTHROPLASTY BIPOLAR HIP (HEMIARTHROPLASTY);  Surgeon: Christena Flake, MD;  Location: [facility] ORS;  Service: Orthopedics;  Laterality: Right;    Allergies: Allergies as of 11/14/2023 - Review Complete 10/23/2023  Allergen Reaction Noted   Acetaminophen Anaphylaxis and Itching 12/09/2015   Sulfa antibiotics Swelling 08/08/2016   Celecoxib Nausea And Vomiting 12/09/2015   Codeine Rash 12/09/2015   Lyrica [pregabalin] Rash 01/23/2018   Penicillin g Rash 12/09/2015   Petrolatum-zinc oxide Rash 12/09/2015    Medications:  Current Outpatient Medications:    albuterol (VENTOLIN HFA) 108 (90 Base) MCG/ACT inhaler, INHALE TWO PUFFS INTO THE LUNGS EVERY 4 HOURS AS NEEDED FOR WHEEZING OR SHORTNESS OF BREATH, Disp: 8.5 g, Rfl: 3   amLODipine (NORVASC) 5 MG tablet, Take 1 tablet (5 mg total) by mouth daily., Disp: , Rfl:    antiseptic oral rinse (BIOTENE) LIQD, 15 mLs by Mouth Rinse route as needed for dry mouth., Disp: , Rfl:    apixaban (ELIQUIS) 5 MG TABS tablet, Take 2 tablets (10 mg total) by mouth 2 (two) times daily for 5 days, THEN 1 tablet (5 mg total) 2 (two) times daily for 25 days., Disp: 70 tablet, Rfl: 0   bisacodyl (DULCOLAX) 10 MG suppository, Place 1  suppository (10 mg total) rectally daily as needed for severe constipation., Disp: , Rfl:    haloperidol (HALDOL) 1 MG tablet, Take 1 tablet (1 mg total) by mouth every 8 (eight) hours as needed for agitation., Disp: 90 tablet, Rfl: 2   LORazepam (ATIVAN) 0.5 MG tablet, Take 1 tablet (0.5 mg total) by mouth every 8 (eight) hours as needed for anxiety., Disp: 30 tablet, Rfl: 0   lubiprostone (AMITIZA) 8 MCG capsule, Take 1 capsule (8 mcg total) by mouth 2 (two)  times daily with a meal., Disp: 60 capsule, Rfl: 3   magnesium hydroxide (MILK OF MAGNESIA) 400 MG/5ML suspension, Take 15 mLs by mouth daily as needed for moderate constipation., Disp: , Rfl:    metoprolol succinate (TOPROL-XL) 25 MG 24 hr tablet, Take 25 mg by mouth daily., Disp: , Rfl:    NYSTATIN powder, APPLY 1 APPLICATION TOPICALLY 3 TIMES DAILY TO GROIN AREA UNTIL RASH RESOLVES, Disp: 15 g, Rfl: 0   ondansetron (ZOFRAN-ODT) 4 MG disintegrating tablet, Take 1 tablet (4 mg total) by mouth every 6 (six) hours as needed for nausea., Disp: 20 tablet, Rfl: 0   polyethylene glycol (MIRALAX / GLYCOLAX) 17 g packet, Take 17 g by mouth daily as needed for mild constipation., Disp: , Rfl:    polyvinyl alcohol (LIQUIFILM TEARS) 1.4 % ophthalmic solution, Place 1 drop into both eyes 4 (four) times daily as needed for dry eyes., Disp: 15 mL, Rfl: 0   senna-docusate (SENOKOT-S) 8.6-50 MG tablet, Take 1 tablet by mouth 2 (two) times daily. Hold if having loose or frequent stools, Disp: , Rfl:    simethicone (MYLICON) 80 MG chewable tablet, Chew 1 tablet (80 mg total) by mouth 4 (four) times daily as needed for flatulence (crampy abdominal pain)., Disp: , Rfl:    traMADol (ULTRAM) 50 MG tablet, Take 1 tablet (50 mg total) by mouth every 6 (six) hours as needed (mild to moderate pain)., Disp: 30 tablet, Rfl: 0  Social History: Social History   Tobacco Use   Smoking status: Former    Current packs/day: 0.00    Average packs/day: 0.5 packs/day for 15.0 years (7.5 ttl pk-yrs)    Types: Cigarettes    Start date: 11/15/1973    Quit date: 11/15/1988    Years since quitting: 34.9   Smokeless tobacco: Never  Vaping Use   Vaping status: Never Used  Substance Use Topics   Alcohol use: No    Alcohol/week: 0.0 standard drinks of alcohol   Drug use: No    Family Medical History: Family History  Problem Relation Age of Onset   Cancer Father    Diabetes Brother     Physical Examination: There were no vitals  filed for this visit.  General: Patient is in no apparent distress. Attention to examination is appropriate.  Neck:   Supple.  Full range of motion.  Respiratory: Patient is breathing without any difficulty.   NEUROLOGICAL:     Awake, alert, oriented to person, place, and time.  Speech is clear and fluent.   Cranial Nerves: Pupils equal round and reactive to light.  Facial tone is symmetric.  Facial sensation is symmetric. Shoulder shrug is symmetric. Tongue protrusion is midline.    Strength: Side Biceps Triceps Deltoid Interossei Grip Wrist Ext. Wrist Flex.  R 5 5 5 5 5 5 5   L 5 5 5 5 5 5 5    Side Iliopsoas Quads Hamstring PF DF EHL  R 5 5 5 5 5  5  L 5 5 5 5 5 5    Reflexes are ***2+ and symmetric at the biceps, triceps, brachioradialis, patella and achilles.   Hoffman's is absent. Clonus is absent  Bilateral upper and lower extremity sensation is intact to light touch ***.     No evidence of dysmetria noted.  Gait is normal.    Imaging: *** I have personally reviewed the images and agree with the above interpretation.  Medical Decision Making/Assessment and Plan: Ms. Barlow is a pleasant 89 y.o. female with ***  There are no diagnoses linked to this encounter.   Thank you for involving me in the care of this patient.    Lovenia Kim MD/MSCR Neurosurgery

## 2023-11-07 ENCOUNTER — Encounter: Payer: Self-pay | Admitting: Internal Medicine

## 2023-11-07 DIAGNOSIS — I10 Essential (primary) hypertension: Secondary | ICD-10-CM | POA: Diagnosis not present

## 2023-11-14 ENCOUNTER — Ambulatory Visit: Payer: BLUE CROSS/BLUE SHIELD | Admitting: Neurosurgery

## 2023-12-01 ENCOUNTER — Encounter: Payer: Self-pay | Admitting: Internal Medicine

## 2023-12-01 DIAGNOSIS — I480 Paroxysmal atrial fibrillation: Secondary | ICD-10-CM | POA: Diagnosis not present

## 2023-12-01 DIAGNOSIS — I10 Essential (primary) hypertension: Secondary | ICD-10-CM | POA: Diagnosis not present

## 2023-12-01 DIAGNOSIS — R630 Anorexia: Secondary | ICD-10-CM | POA: Diagnosis not present

## 2023-12-07 ENCOUNTER — Ambulatory Visit: Payer: BLUE CROSS/BLUE SHIELD | Admitting: Nurse Practitioner

## 2023-12-08 ENCOUNTER — Encounter: Payer: Self-pay | Admitting: Internal Medicine

## 2023-12-13 ENCOUNTER — Encounter: Payer: Self-pay | Admitting: Internal Medicine

## 2023-12-28 DIAGNOSIS — N1832 Chronic kidney disease, stage 3b: Secondary | ICD-10-CM | POA: Diagnosis not present

## 2023-12-28 DIAGNOSIS — I10 Essential (primary) hypertension: Secondary | ICD-10-CM | POA: Diagnosis not present

## 2023-12-28 DIAGNOSIS — J449 Chronic obstructive pulmonary disease, unspecified: Secondary | ICD-10-CM | POA: Diagnosis not present

## 2023-12-28 DIAGNOSIS — K5900 Constipation, unspecified: Secondary | ICD-10-CM | POA: Diagnosis not present

## 2023-12-28 DIAGNOSIS — I480 Paroxysmal atrial fibrillation: Secondary | ICD-10-CM | POA: Diagnosis not present

## 2024-01-23 DIAGNOSIS — R634 Abnormal weight loss: Secondary | ICD-10-CM | POA: Diagnosis not present

## 2024-01-23 DIAGNOSIS — R630 Anorexia: Secondary | ICD-10-CM | POA: Diagnosis not present

## 2024-01-23 DIAGNOSIS — I10 Essential (primary) hypertension: Secondary | ICD-10-CM | POA: Diagnosis not present

## 2024-01-23 DIAGNOSIS — I82402 Acute embolism and thrombosis of unspecified deep veins of left lower extremity: Secondary | ICD-10-CM | POA: Diagnosis not present

## 2024-01-23 DIAGNOSIS — F03911 Unspecified dementia, unspecified severity, with agitation: Secondary | ICD-10-CM | POA: Diagnosis not present

## 2024-01-24 ENCOUNTER — Encounter: Payer: Self-pay | Admitting: Internal Medicine

## 2024-01-24 DIAGNOSIS — E1122 Type 2 diabetes mellitus with diabetic chronic kidney disease: Secondary | ICD-10-CM | POA: Diagnosis not present

## 2024-01-24 DIAGNOSIS — I480 Paroxysmal atrial fibrillation: Secondary | ICD-10-CM | POA: Diagnosis not present

## 2024-01-26 DIAGNOSIS — E1122 Type 2 diabetes mellitus with diabetic chronic kidney disease: Secondary | ICD-10-CM | POA: Diagnosis not present

## 2024-01-26 DIAGNOSIS — M6281 Muscle weakness (generalized): Secondary | ICD-10-CM | POA: Diagnosis not present

## 2024-01-26 DIAGNOSIS — F419 Anxiety disorder, unspecified: Secondary | ICD-10-CM | POA: Diagnosis not present

## 2024-01-26 DIAGNOSIS — N1832 Chronic kidney disease, stage 3b: Secondary | ICD-10-CM | POA: Diagnosis not present

## 2024-01-27 DIAGNOSIS — M6281 Muscle weakness (generalized): Secondary | ICD-10-CM | POA: Diagnosis not present

## 2024-01-30 DIAGNOSIS — M6281 Muscle weakness (generalized): Secondary | ICD-10-CM | POA: Diagnosis not present

## 2024-01-30 DIAGNOSIS — F419 Anxiety disorder, unspecified: Secondary | ICD-10-CM | POA: Diagnosis not present

## 2024-01-31 DIAGNOSIS — M6281 Muscle weakness (generalized): Secondary | ICD-10-CM | POA: Diagnosis not present

## 2024-02-01 DIAGNOSIS — M6281 Muscle weakness (generalized): Secondary | ICD-10-CM | POA: Diagnosis not present

## 2024-02-02 DIAGNOSIS — M6281 Muscle weakness (generalized): Secondary | ICD-10-CM | POA: Diagnosis not present

## 2024-02-03 DIAGNOSIS — M6281 Muscle weakness (generalized): Secondary | ICD-10-CM | POA: Diagnosis not present

## 2024-02-04 DIAGNOSIS — M6281 Muscle weakness (generalized): Secondary | ICD-10-CM | POA: Diagnosis not present

## 2024-02-06 DIAGNOSIS — M6281 Muscle weakness (generalized): Secondary | ICD-10-CM | POA: Diagnosis not present

## 2024-02-07 DIAGNOSIS — M6281 Muscle weakness (generalized): Secondary | ICD-10-CM | POA: Diagnosis not present

## 2024-02-08 DIAGNOSIS — M6281 Muscle weakness (generalized): Secondary | ICD-10-CM | POA: Diagnosis not present

## 2024-02-09 DIAGNOSIS — M6281 Muscle weakness (generalized): Secondary | ICD-10-CM | POA: Diagnosis not present

## 2024-02-10 DIAGNOSIS — M6281 Muscle weakness (generalized): Secondary | ICD-10-CM | POA: Diagnosis not present

## 2024-02-11 DIAGNOSIS — M6281 Muscle weakness (generalized): Secondary | ICD-10-CM | POA: Diagnosis not present

## 2024-02-11 DIAGNOSIS — F4321 Adjustment disorder with depressed mood: Secondary | ICD-10-CM | POA: Diagnosis not present

## 2024-02-14 DIAGNOSIS — M6281 Muscle weakness (generalized): Secondary | ICD-10-CM | POA: Diagnosis not present

## 2024-02-20 DIAGNOSIS — R0989 Other specified symptoms and signs involving the circulatory and respiratory systems: Secondary | ICD-10-CM | POA: Diagnosis not present

## 2024-02-20 DIAGNOSIS — J069 Acute upper respiratory infection, unspecified: Secondary | ICD-10-CM | POA: Diagnosis not present

## 2024-02-21 DIAGNOSIS — R509 Fever, unspecified: Secondary | ICD-10-CM | POA: Diagnosis not present

## 2024-02-21 DIAGNOSIS — R059 Cough, unspecified: Secondary | ICD-10-CM | POA: Diagnosis not present

## 2024-02-21 DIAGNOSIS — R051 Acute cough: Secondary | ICD-10-CM | POA: Diagnosis not present

## 2024-02-22 DIAGNOSIS — I10 Essential (primary) hypertension: Secondary | ICD-10-CM | POA: Diagnosis not present

## 2024-02-22 DIAGNOSIS — B338 Other specified viral diseases: Secondary | ICD-10-CM | POA: Diagnosis not present

## 2024-02-22 DIAGNOSIS — I82402 Acute embolism and thrombosis of unspecified deep veins of left lower extremity: Secondary | ICD-10-CM | POA: Diagnosis not present

## 2024-02-22 DIAGNOSIS — I480 Paroxysmal atrial fibrillation: Secondary | ICD-10-CM | POA: Diagnosis not present

## 2024-02-22 DIAGNOSIS — K5900 Constipation, unspecified: Secondary | ICD-10-CM | POA: Diagnosis not present

## 2024-02-22 DIAGNOSIS — R630 Anorexia: Secondary | ICD-10-CM | POA: Diagnosis not present

## 2024-03-05 DIAGNOSIS — E1122 Type 2 diabetes mellitus with diabetic chronic kidney disease: Secondary | ICD-10-CM | POA: Diagnosis not present

## 2024-03-05 DIAGNOSIS — E118 Type 2 diabetes mellitus with unspecified complications: Secondary | ICD-10-CM | POA: Diagnosis not present

## 2024-03-05 DIAGNOSIS — I82402 Acute embolism and thrombosis of unspecified deep veins of left lower extremity: Secondary | ICD-10-CM | POA: Diagnosis not present

## 2024-03-05 DIAGNOSIS — J449 Chronic obstructive pulmonary disease, unspecified: Secondary | ICD-10-CM | POA: Diagnosis not present

## 2024-03-22 DIAGNOSIS — E1122 Type 2 diabetes mellitus with diabetic chronic kidney disease: Secondary | ICD-10-CM | POA: Diagnosis not present

## 2024-03-22 DIAGNOSIS — E118 Type 2 diabetes mellitus with unspecified complications: Secondary | ICD-10-CM | POA: Diagnosis not present

## 2024-04-18 DIAGNOSIS — H353190 Nonexudative age-related macular degeneration, unspecified eye, stage unspecified: Secondary | ICD-10-CM | POA: Diagnosis not present

## 2024-04-18 DIAGNOSIS — Z961 Presence of intraocular lens: Secondary | ICD-10-CM | POA: Diagnosis not present

## 2024-04-23 DIAGNOSIS — J449 Chronic obstructive pulmonary disease, unspecified: Secondary | ICD-10-CM | POA: Diagnosis not present

## 2024-04-23 DIAGNOSIS — R630 Anorexia: Secondary | ICD-10-CM | POA: Diagnosis not present

## 2024-04-23 DIAGNOSIS — I10 Essential (primary) hypertension: Secondary | ICD-10-CM | POA: Diagnosis not present

## 2024-06-18 DIAGNOSIS — R630 Anorexia: Secondary | ICD-10-CM | POA: Diagnosis not present

## 2024-06-27 DIAGNOSIS — S32010A Wedge compression fracture of first lumbar vertebra, initial encounter for closed fracture: Secondary | ICD-10-CM | POA: Diagnosis not present

## 2024-06-27 DIAGNOSIS — I10 Essential (primary) hypertension: Secondary | ICD-10-CM | POA: Diagnosis not present

## 2024-06-27 DIAGNOSIS — I82402 Acute embolism and thrombosis of unspecified deep veins of left lower extremity: Secondary | ICD-10-CM | POA: Diagnosis not present

## 2024-06-27 DIAGNOSIS — J449 Chronic obstructive pulmonary disease, unspecified: Secondary | ICD-10-CM | POA: Diagnosis not present

## 2024-06-27 DIAGNOSIS — N1832 Chronic kidney disease, stage 3b: Secondary | ICD-10-CM | POA: Diagnosis not present

## 2024-06-27 DIAGNOSIS — I480 Paroxysmal atrial fibrillation: Secondary | ICD-10-CM | POA: Diagnosis not present

## 2024-06-27 DIAGNOSIS — F03918 Unspecified dementia, unspecified severity, with other behavioral disturbance: Secondary | ICD-10-CM | POA: Diagnosis not present

## 2024-06-29 ENCOUNTER — Telehealth: Payer: Self-pay

## 2024-06-29 NOTE — Telephone Encounter (Signed)
 Left patient daughter Harland a message to give office a call back

## 2024-07-03 DIAGNOSIS — F03918 Unspecified dementia, unspecified severity, with other behavioral disturbance: Secondary | ICD-10-CM | POA: Diagnosis not present

## 2024-07-03 DIAGNOSIS — Z23 Encounter for immunization: Secondary | ICD-10-CM | POA: Diagnosis not present

## 2024-07-23 DIAGNOSIS — J189 Pneumonia, unspecified organism: Secondary | ICD-10-CM | POA: Diagnosis not present

## 2024-07-25 DIAGNOSIS — M6281 Muscle weakness (generalized): Secondary | ICD-10-CM | POA: Diagnosis not present

## 2024-07-25 DIAGNOSIS — M79662 Pain in left lower leg: Secondary | ICD-10-CM | POA: Diagnosis not present

## 2024-07-26 DIAGNOSIS — I82412 Acute embolism and thrombosis of left femoral vein: Secondary | ICD-10-CM | POA: Diagnosis not present

## 2024-07-27 ENCOUNTER — Encounter: Payer: Self-pay | Admitting: Internal Medicine

## 2024-07-27 DIAGNOSIS — I82402 Acute embolism and thrombosis of unspecified deep veins of left lower extremity: Secondary | ICD-10-CM | POA: Diagnosis not present

## 2024-08-06 DIAGNOSIS — R059 Cough, unspecified: Secondary | ICD-10-CM | POA: Diagnosis not present

## 2024-08-07 ENCOUNTER — Encounter: Payer: Self-pay | Admitting: Internal Medicine

## 2024-08-09 ENCOUNTER — Inpatient Hospital Stay: Attending: Oncology | Admitting: Oncology

## 2024-08-09 ENCOUNTER — Inpatient Hospital Stay

## 2024-08-09 ENCOUNTER — Encounter: Payer: Self-pay | Admitting: Oncology

## 2024-08-09 VITALS — BP 135/50 | HR 55 | Temp 97.7°F | Resp 16

## 2024-08-09 DIAGNOSIS — I82402 Acute embolism and thrombosis of unspecified deep veins of left lower extremity: Secondary | ICD-10-CM | POA: Diagnosis not present

## 2024-08-09 DIAGNOSIS — I8290 Acute embolism and thrombosis of unspecified vein: Secondary | ICD-10-CM

## 2024-08-09 DIAGNOSIS — Z87891 Personal history of nicotine dependence: Secondary | ICD-10-CM | POA: Diagnosis not present

## 2024-08-09 DIAGNOSIS — Z7901 Long term (current) use of anticoagulants: Secondary | ICD-10-CM | POA: Insufficient documentation

## 2024-08-09 DIAGNOSIS — Z79899 Other long term (current) drug therapy: Secondary | ICD-10-CM | POA: Insufficient documentation

## 2024-08-09 DIAGNOSIS — Z923 Personal history of irradiation: Secondary | ICD-10-CM | POA: Insufficient documentation

## 2024-08-09 DIAGNOSIS — Z86718 Personal history of other venous thrombosis and embolism: Secondary | ICD-10-CM | POA: Insufficient documentation

## 2024-08-09 DIAGNOSIS — Z85831 Personal history of malignant neoplasm of soft tissue: Secondary | ICD-10-CM | POA: Insufficient documentation

## 2024-08-09 DIAGNOSIS — Z809 Family history of malignant neoplasm, unspecified: Secondary | ICD-10-CM | POA: Diagnosis not present

## 2024-08-09 NOTE — Progress Notes (Signed)
 [facility]  Telephone:(336) [phone] Fax:(336) [phone]  ID: Monica [name] OB: [birth year]  MR#: [id number]  RDW#:[id number]  Patient Care Team: Pcp, No as PCP - General Vincenzo Slough, RPH (Inactive) as Pharmacist (Pharmacist)  CHIEF COMPLAINT: DVT while on Eliquis .  INTERVAL HISTORY: Patient is a 89 year old female with a distant history of sarcoma who has not been seen in clinic in greater than 5 years.  She developed a left lower leg DVT in December 2024 and has been on Eliquis  since that time.  More recently she has found to have a second DVT also in her left leg but in a different vein while taking Eliquis .  Unclear of her compliance of medication.  She is currently on Lovenox .  She currently feels well and at her baseline.  She has no neurologic complaints.  She denies any recent fevers or illnesses.  She continues to have chronic weakness and fatigue.  She denies any chest pain, shortness of breath, cough, or hemoptysis.  She has no nausea, vomiting, constipation, or diarrhea.  She has no urinary complaints.  Patient offers no further specific complaints today.  REVIEW OF SYSTEMS:   Review of Systems  Constitutional:  Positive for malaise/fatigue. Negative for fever and weight loss.  Respiratory: Negative.  Negative for cough, hemoptysis and shortness of breath.   Cardiovascular: Negative.  Negative for chest pain and leg swelling.  Gastrointestinal: Negative.  Negative for abdominal pain.  Genitourinary: Negative.  Negative for dysuria.  Musculoskeletal:  Positive for falls.  Skin: Negative.  Negative for rash.  Neurological: Negative.  Negative for dizziness, focal weakness, weakness and headaches.  Psychiatric/Behavioral: Negative.  The patient is not nervous/anxious.     As per HPI. Otherwise, a complete review of systems is negative.  PAST MEDICAL HISTORY: Past Medical History:  Diagnosis Date   Agitation 10/23/2023   Atrial fibrillation (HCC)     Bronchitis    Cancer (HCC)    Left leg growth, kidneys, lungs and breasts   Carcinoma of unknown primary (HCC)    COPD (chronic obstructive pulmonary disease) (HCC)    Diabetes mellitus without complication (HCC)    Hyperlipidemia    Hypertension    Pancreatitis    Pneumonia    Stroke (HCC)    TIA's   Vitamin B12 deficiency     PAST SURGICAL HISTORY: Past Surgical History:  Procedure Laterality Date   ABDOMINAL HYSTERECTOMY  1975   BLADDER SUSPENSION  1989   CAROTID ANGIOGRAPHY Right 11/23/2019   Procedure: CAROTID ANGIOGRAPHY;  Surgeon: Jama Cordella MATSU, MD;  Location: [facility] INVASIVE CV LAB;  Service: Cardiovascular;  Laterality: Right;   COLONOSCOPY     COLONOSCOPY WITH PROPOFOL  N/A 01/25/2018   Procedure: COLONOSCOPY WITH PROPOFOL ;  Surgeon: Jinny Carmine, MD;  Location: [facility] ENDOSCOPY;  Service: Endoscopy;  Laterality: N/A;   ESOPHAGOGASTRODUODENOSCOPY (EGD) WITH PROPOFOL  N/A 01/24/2018   Procedure: ESOPHAGOGASTRODUODENOSCOPY (EGD) WITH PROPOFOL ;  Surgeon: Jinny Carmine, MD;  Location: [facility] ENDOSCOPY;  Service: Endoscopy;  Laterality: N/A;   HIP ARTHROPLASTY Left 06/03/2019   Procedure: ARTHROPLASTY BIPOLAR HIP (HEMIARTHROPLASTY);  Surgeon: Mardee Lynwood SQUIBB, MD;  Location: [facility] ORS;  Service: Orthopedics;  Laterality: Left;   HIP ARTHROPLASTY Right 04/21/2020   Procedure: ARTHROPLASTY BIPOLAR HIP (HEMIARTHROPLASTY);  Surgeon: Edie Norleen JINNY, MD;  Location: [facility] ORS;  Service: Orthopedics;  Laterality: Right;    FAMILY HISTORY: Family History  Problem Relation Age of Onset   Cancer Father    Diabetes Brother     ADVANCED DIRECTIVES (  Y/N):  N  HEALTH MAINTENANCE: Social History   Tobacco Use   Smoking status: Former    Current packs/day: 0.00    Average packs/day: 0.5 packs/day for 15.0 years (7.5 ttl pk-yrs)    Types: Cigarettes    Start date: 11/15/1973    Quit date: 11/15/1988    Years since quitting: 35.7   Smokeless tobacco: Never  Vaping Use   Vaping status: Never  Used  Substance Use Topics   Alcohol  use: No    Alcohol /week: 0.0 standard drinks of alcohol    Drug use: No     Colonoscopy:  PAP:  Bone density:  Lipid panel:  Allergies  Allergen Reactions   Acetaminophen  Anaphylaxis and Itching   Sulfa Antibiotics Swelling   Celecoxib Nausea And Vomiting   Codeine Rash   Diaper Rash Products Rash   Lyrica [Pregabalin] Rash   Penicillin G Rash    Has patient had a PCN reaction causing immediate rash, facial/tongue/throat swelling, SOB or lightheadedness with hypotension: Yes Has patient had a PCN reaction causing severe rash involving mucus membranes or skin necrosis: No Has patient had a PCN reaction that required hospitalization: No Has patient had a PCN reaction occurring within the last 10 years: Unknown If all of the above answers are NO, then may proceed with Cephalosporin use.    Current Outpatient Medications  Medication Sig Dispense Refill   albuterol  (VENTOLIN  HFA) 108 (90 Base) MCG/ACT inhaler INHALE TWO PUFFS INTO THE LUNGS EVERY 4 HOURS AS NEEDED FOR WHEEZING OR SHORTNESS OF BREATH 8.5 g 3   amLODipine  (NORVASC ) 5 MG tablet Take 1 tablet (5 mg total) by mouth daily.     antiseptic oral rinse (BIOTENE) LIQD 15 mLs by Mouth Rinse route as needed for dry mouth.     apixaban  (ELIQUIS ) 5 MG TABS tablet Take 2 tablets (10 mg total) by mouth 2 (two) times daily for 5 days, THEN 1 tablet (5 mg total) 2 (two) times daily for 25 days. 70 tablet 0   bisacodyl  (DULCOLAX) 10 MG suppository Place 1 suppository (10 mg total) rectally daily as needed for severe constipation.     haloperidol  (HALDOL ) 1 MG tablet Take 1 tablet (1 mg total) by mouth every 8 (eight) hours as needed for agitation. 90 tablet 2   LORazepam  (ATIVAN ) 0.5 MG tablet Take 1 tablet (0.5 mg total) by mouth every 8 (eight) hours as needed for anxiety. 30 tablet 0   lubiprostone  (AMITIZA ) 8 MCG capsule Take 1 capsule (8 mcg total) by mouth 2 (two) times daily with a meal. 60  capsule 3   magnesium  hydroxide (MILK OF MAGNESIA) 400 MG/5ML suspension Take 15 mLs by mouth daily as needed for moderate constipation.     metoprolol  succinate (TOPROL -XL) 25 MG 24 hr tablet Take 25 mg by mouth daily.     NYSTATIN  powder APPLY 1 APPLICATION TOPICALLY 3 TIMES DAILY TO GROIN AREA UNTIL RASH RESOLVES 15 g 0   ondansetron  (ZOFRAN -ODT) 4 MG disintegrating tablet Take 1 tablet (4 mg total) by mouth every 6 (six) hours as needed for nausea. 20 tablet 0   polyethylene glycol (MIRALAX  / GLYCOLAX ) 17 g packet Take 17 g by mouth daily as needed for mild constipation.     polyvinyl alcohol  (LIQUIFILM TEARS) 1.4 % ophthalmic solution Place 1 drop into both eyes 4 (four) times daily as needed for dry eyes. 15 mL 0   senna-docusate (SENOKOT-S) 8.6-50 MG tablet Take 1 tablet by mouth 2 (two) times daily. Hold  if having loose or frequent stools     simethicone  (MYLICON) 80 MG chewable tablet Chew 1 tablet (80 mg total) by mouth 4 (four) times daily as needed for flatulence (crampy abdominal pain).     traMADol  (ULTRAM ) 50 MG tablet Take 1 tablet (50 mg total) by mouth every 6 (six) hours as needed (mild to moderate pain). 30 tablet 0   No current facility-administered medications for this visit.    OBJECTIVE: Vitals:   08/09/24 1130  BP: (!) 135/50  Pulse: (!) 55  Resp: 16  Temp: 97.7 F (36.5 C)  SpO2: 98%     There is no height or weight on file to calculate BMI.    ECOG FS:2 - Symptomatic, <50% confined to bed  General: Well-developed, well-nourished, no acute distress.  Sitting in a wheelchair. Eyes: Pink conjunctiva, anicteric sclera. HEENT: Normocephalic, moist mucous membranes. Lungs: No audible wheezing or coughing. Heart: Regular rate and rhythm. Abdomen: Soft, nontender, no obvious distention. Musculoskeletal: No edema, cyanosis, or clubbing. Neuro: Alert, answering all questions appropriately. Cranial nerves grossly intact. Skin: No rashes or petechiae noted. Psych:  Normal affect. Lymphatics: No cervical, calvicular, axillary or inguinal LAD.   LAB RESULTS:  Lab Results  Component Value Date   NA 135 10/26/2023   K 3.7 10/26/2023   CL 103 10/26/2023   CO2 26 10/26/2023   GLUCOSE 160 (H) 10/26/2023   BUN 23 10/26/2023   CREATININE 0.69 10/26/2023   CALCIUM  8.8 (L) 10/26/2023   PROT 6.1 (L) 10/18/2023   ALBUMIN 3.1 (L) 10/18/2023   AST 17 10/18/2023   ALT 12 10/18/2023   ALKPHOS 82 10/18/2023   BILITOT 0.7 10/18/2023   GFRNONAA >60 10/26/2023   GFRAA >60 04/24/2020    Lab Results  Component Value Date   WBC 6.6 10/26/2023   NEUTROABS 3.9 10/26/2023   HGB 10.7 (L) 10/26/2023   HCT 33.3 (L) 10/26/2023   MCV 81.6 10/26/2023   PLT 265 10/26/2023     STUDIES: No results found.  ASSESSMENT: DVT while on Eliquis .  PLAN:    DVT while on Eliquis : She developed a left lower leg DVT in December 2024 and has been on Eliquis  since that time.  More recently she has found to have a second DVT also in her left leg but in a different vein while taking Eliquis .  Unclear of her compliance of medication.  She was subsequently switched to Lovenox .  Patient has a history of falls.  While patient has increased risk of being on blood thinners given her advanced age and history of falls, the risk of propagation of this DVT as well as development of pulmonary embolism is likely higher and patient is not a candidate for IVC filter placement.  Because of this, recommending switching anticoagulation to Xarelto 20 mg once daily.  Discontinue Lovenox .  No further intervention is needed.  No further follow-up is necessary. History of sarcoma: Patient received XRT and several doses of Keytruda , but then elected to discontinue all treatments approximately 5 years ago.  I spent a total of 45 minutes reviewing chart data, face-to-face evaluation with the patient, counseling and coordination of care as detailed above.  Patient expressed understanding and was in  agreement with this plan. She also understands that She can call clinic at any time with any questions, concerns, or complaints.    Evalene JINNY Reusing, MD   08/09/2024 4:47 PM

## 2024-08-09 NOTE — Progress Notes (Signed)
 Patient uses the white [employer].

## 2024-08-13 DIAGNOSIS — M79605 Pain in left leg: Secondary | ICD-10-CM | POA: Diagnosis not present

## 2024-08-16 DIAGNOSIS — L299 Pruritus, unspecified: Secondary | ICD-10-CM | POA: Diagnosis not present

## 2024-08-22 DIAGNOSIS — M6281 Muscle weakness (generalized): Secondary | ICD-10-CM | POA: Diagnosis not present

## 2024-08-23 DIAGNOSIS — Z0389 Encounter for observation for other suspected diseases and conditions ruled out: Secondary | ICD-10-CM | POA: Diagnosis not present

## 2024-08-23 DIAGNOSIS — J449 Chronic obstructive pulmonary disease, unspecified: Secondary | ICD-10-CM | POA: Diagnosis not present

## 2024-08-30 DIAGNOSIS — F03918 Unspecified dementia, unspecified severity, with other behavioral disturbance: Secondary | ICD-10-CM | POA: Diagnosis not present

## 2024-08-30 DIAGNOSIS — Z23 Encounter for immunization: Secondary | ICD-10-CM | POA: Diagnosis not present

## 2024-08-30 DIAGNOSIS — I10 Essential (primary) hypertension: Secondary | ICD-10-CM | POA: Diagnosis not present

## 2024-08-30 DIAGNOSIS — N1832 Chronic kidney disease, stage 3b: Secondary | ICD-10-CM | POA: Diagnosis not present

## 2024-08-30 DIAGNOSIS — J449 Chronic obstructive pulmonary disease, unspecified: Secondary | ICD-10-CM | POA: Diagnosis not present

## 2024-08-30 DIAGNOSIS — Z86718 Personal history of other venous thrombosis and embolism: Secondary | ICD-10-CM | POA: Diagnosis not present

## 2024-09-04 DIAGNOSIS — I82509 Chronic embolism and thrombosis of unspecified deep veins of unspecified lower extremity: Secondary | ICD-10-CM | POA: Diagnosis not present

## 2024-09-18 DIAGNOSIS — J449 Chronic obstructive pulmonary disease, unspecified: Secondary | ICD-10-CM | POA: Diagnosis not present

## 2024-09-27 DIAGNOSIS — K5900 Constipation, unspecified: Secondary | ICD-10-CM | POA: Diagnosis not present

## 2024-09-27 DIAGNOSIS — J449 Chronic obstructive pulmonary disease, unspecified: Secondary | ICD-10-CM | POA: Diagnosis not present

## 2024-10-04 DIAGNOSIS — J449 Chronic obstructive pulmonary disease, unspecified: Secondary | ICD-10-CM | POA: Diagnosis not present

## 2024-10-04 DIAGNOSIS — Z7189 Other specified counseling: Secondary | ICD-10-CM | POA: Diagnosis not present

## 2024-10-08 DIAGNOSIS — R21 Rash and other nonspecific skin eruption: Secondary | ICD-10-CM | POA: Diagnosis not present

## 2024-10-24 DIAGNOSIS — M79605 Pain in left leg: Secondary | ICD-10-CM | POA: Diagnosis not present

## 2024-10-24 DIAGNOSIS — I82509 Chronic embolism and thrombosis of unspecified deep veins of unspecified lower extremity: Secondary | ICD-10-CM | POA: Diagnosis not present

## 2024-10-24 DIAGNOSIS — F03918 Unspecified dementia, unspecified severity, with other behavioral disturbance: Secondary | ICD-10-CM | POA: Diagnosis not present

## 2024-10-24 DIAGNOSIS — I10 Essential (primary) hypertension: Secondary | ICD-10-CM | POA: Diagnosis not present

## 2024-10-24 DIAGNOSIS — J449 Chronic obstructive pulmonary disease, unspecified: Secondary | ICD-10-CM | POA: Diagnosis not present

## 2024-10-24 DIAGNOSIS — K5900 Constipation, unspecified: Secondary | ICD-10-CM | POA: Diagnosis not present

## 2024-10-24 DIAGNOSIS — N1832 Chronic kidney disease, stage 3b: Secondary | ICD-10-CM | POA: Diagnosis not present

## 2024-12-19 ENCOUNTER — Telehealth: Payer: Self-pay | Admitting: Oncology

## 2024-12-19 NOTE — Telephone Encounter (Signed)
 Returned call to [facility] for additional information on need for visit.  Patient was last visit by Dr. Jacobo on 08/09/24 with plan switching anticoagulation to Xarelto 20 mg once daily.  No further intervention is needed.  No further follow-up is necessary.  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  Informed Monica [name] that MD advise no f/u needed at last visit in 07/2024.  She will relay the message to [facility] MD and I will fax last office note for their review.    Fax [phone]

## 2024-12-19 NOTE — Telephone Encounter (Signed)
 Orvin, from [facility] in [place],  [place] to schedule pt for appt, stated pt needs to be seen for eliquis  failure,acute DVT on eliquis .  Please advise and call Orvin back with update - she can be reached at [phone]
